# Patient Record
Sex: Female | Born: 1960 | Race: Black or African American | Hispanic: No | Marital: Married | Smoking: Never smoker
Health system: Southern US, Community
[De-identification: ages and names within clinical notes are randomized; demographics above are authoritative.]

## PROBLEM LIST (undated history)

## (undated) DIAGNOSIS — M51369 Other intervertebral disc degeneration, lumbar region without mention of lumbar back pain or lower extremity pain: Secondary | ICD-10-CM

## (undated) DIAGNOSIS — D509 Iron deficiency anemia, unspecified: Secondary | ICD-10-CM

## (undated) DIAGNOSIS — R569 Unspecified convulsions: Secondary | ICD-10-CM

## (undated) DIAGNOSIS — T7840XA Allergy, unspecified, initial encounter: Secondary | ICD-10-CM

## (undated) DIAGNOSIS — I10 Essential (primary) hypertension: Secondary | ICD-10-CM

## (undated) DIAGNOSIS — J302 Other seasonal allergic rhinitis: Secondary | ICD-10-CM

## (undated) DIAGNOSIS — M5136 Other intervertebral disc degeneration, lumbar region: Secondary | ICD-10-CM

## (undated) DIAGNOSIS — Z9889 Other specified postprocedural states: Secondary | ICD-10-CM

## (undated) DIAGNOSIS — J45909 Unspecified asthma, uncomplicated: Secondary | ICD-10-CM

## (undated) DIAGNOSIS — G43909 Migraine, unspecified, not intractable, without status migrainosus: Secondary | ICD-10-CM

## (undated) DIAGNOSIS — E669 Obesity, unspecified: Secondary | ICD-10-CM

## (undated) DIAGNOSIS — M359 Systemic involvement of connective tissue, unspecified: Secondary | ICD-10-CM

## (undated) DIAGNOSIS — R011 Cardiac murmur, unspecified: Secondary | ICD-10-CM

## (undated) DIAGNOSIS — D219 Benign neoplasm of connective and other soft tissue, unspecified: Secondary | ICD-10-CM

## (undated) DIAGNOSIS — M329 Systemic lupus erythematosus, unspecified: Secondary | ICD-10-CM

## (undated) DIAGNOSIS — Z9289 Personal history of other medical treatment: Secondary | ICD-10-CM

## (undated) DIAGNOSIS — C801 Malignant (primary) neoplasm, unspecified: Secondary | ICD-10-CM

## (undated) HISTORY — DX: Other intervertebral disc degeneration, lumbar region: M51.36

## (undated) HISTORY — DX: Migraine, unspecified, not intractable, without status migrainosus: G43.909

## (undated) HISTORY — DX: Allergy, unspecified, initial encounter: T78.40XA

## (undated) HISTORY — DX: Other intervertebral disc degeneration, lumbar region without mention of lumbar back pain or lower extremity pain: M51.369

## (undated) HISTORY — PX: OTHER SURGICAL HISTORY: SHX169

## (undated) HISTORY — DX: Obesity, unspecified: E66.9

## (undated) HISTORY — DX: Unspecified convulsions: R56.9

## (undated) HISTORY — PX: HERNIA REPAIR: SHX51

## (undated) HISTORY — DX: Personal history of other medical treatment: Z92.89

## (undated) HISTORY — PX: MOUTH SURGERY: SHX715

## (undated) HISTORY — DX: Other specified postprocedural states: Z98.890

## (undated) HISTORY — DX: Benign neoplasm of connective and other soft tissue, unspecified: D21.9

## (undated) HISTORY — DX: Iron deficiency anemia, unspecified: D50.9

## (undated) HISTORY — PX: ABDOMINAL HYSTERECTOMY: SHX81

---

## 1998-12-09 ENCOUNTER — Encounter: Payer: Self-pay | Admitting: Internal Medicine

## 1998-12-09 ENCOUNTER — Encounter: Admission: RE | Admit: 1998-12-09 | Discharge: 1998-12-09 | Payer: Self-pay | Admitting: Internal Medicine

## 1999-06-17 ENCOUNTER — Other Ambulatory Visit: Admission: RE | Admit: 1999-06-17 | Discharge: 1999-06-17 | Payer: Self-pay | Admitting: Obstetrics and Gynecology

## 1999-08-03 ENCOUNTER — Inpatient Hospital Stay (HOSPITAL_COMMUNITY): Admission: RE | Admit: 1999-08-03 | Discharge: 1999-08-06 | Payer: Self-pay | Admitting: Obstetrics and Gynecology

## 1999-08-03 ENCOUNTER — Encounter (INDEPENDENT_AMBULATORY_CARE_PROVIDER_SITE_OTHER): Payer: Self-pay | Admitting: Specialist

## 1999-12-21 ENCOUNTER — Encounter: Admission: RE | Admit: 1999-12-21 | Discharge: 1999-12-21 | Payer: Self-pay | Admitting: Obstetrics and Gynecology

## 1999-12-21 ENCOUNTER — Encounter: Payer: Self-pay | Admitting: Obstetrics and Gynecology

## 2000-06-17 ENCOUNTER — Other Ambulatory Visit: Admission: RE | Admit: 2000-06-17 | Discharge: 2000-06-17 | Payer: Self-pay | Admitting: Obstetrics and Gynecology

## 2000-12-23 ENCOUNTER — Encounter: Payer: Self-pay | Admitting: Obstetrics and Gynecology

## 2000-12-23 ENCOUNTER — Encounter: Admission: RE | Admit: 2000-12-23 | Discharge: 2000-12-23 | Payer: Self-pay | Admitting: Obstetrics and Gynecology

## 2001-06-20 ENCOUNTER — Other Ambulatory Visit: Admission: RE | Admit: 2001-06-20 | Discharge: 2001-06-20 | Payer: Self-pay | Admitting: Obstetrics and Gynecology

## 2001-12-25 ENCOUNTER — Encounter: Payer: Self-pay | Admitting: Obstetrics and Gynecology

## 2001-12-25 ENCOUNTER — Encounter: Admission: RE | Admit: 2001-12-25 | Discharge: 2001-12-25 | Payer: Self-pay | Admitting: Obstetrics and Gynecology

## 2003-01-04 ENCOUNTER — Encounter: Admission: RE | Admit: 2003-01-04 | Discharge: 2003-01-04 | Payer: Self-pay | Admitting: Obstetrics and Gynecology

## 2003-10-29 ENCOUNTER — Emergency Department (HOSPITAL_COMMUNITY): Admission: EM | Admit: 2003-10-29 | Discharge: 2003-10-29 | Payer: Self-pay | Admitting: Emergency Medicine

## 2004-01-16 ENCOUNTER — Encounter: Admission: RE | Admit: 2004-01-16 | Discharge: 2004-01-16 | Payer: Self-pay | Admitting: Obstetrics and Gynecology

## 2005-02-15 ENCOUNTER — Encounter: Admission: RE | Admit: 2005-02-15 | Discharge: 2005-02-15 | Payer: Self-pay | Admitting: Obstetrics and Gynecology

## 2006-02-17 ENCOUNTER — Encounter: Admission: RE | Admit: 2006-02-17 | Discharge: 2006-02-17 | Payer: Self-pay | Admitting: Obstetrics and Gynecology

## 2006-03-02 ENCOUNTER — Encounter: Admission: RE | Admit: 2006-03-02 | Discharge: 2006-03-02 | Payer: Self-pay | Admitting: Obstetrics and Gynecology

## 2007-01-26 HISTORY — PX: BACK SURGERY: SHX140

## 2007-03-06 ENCOUNTER — Encounter: Admission: RE | Admit: 2007-03-06 | Discharge: 2007-03-06 | Payer: Self-pay | Admitting: Internal Medicine

## 2007-05-03 ENCOUNTER — Ambulatory Visit (HOSPITAL_COMMUNITY): Admission: RE | Admit: 2007-05-03 | Discharge: 2007-05-04 | Payer: Self-pay | Admitting: Neurosurgery

## 2008-03-14 ENCOUNTER — Encounter: Admission: RE | Admit: 2008-03-14 | Discharge: 2008-03-14 | Payer: Self-pay | Admitting: Obstetrics and Gynecology

## 2008-03-21 ENCOUNTER — Encounter: Admission: RE | Admit: 2008-03-21 | Discharge: 2008-03-21 | Payer: Self-pay | Admitting: Obstetrics and Gynecology

## 2009-01-25 DIAGNOSIS — M329 Systemic lupus erythematosus, unspecified: Secondary | ICD-10-CM

## 2009-01-25 DIAGNOSIS — IMO0002 Reserved for concepts with insufficient information to code with codable children: Secondary | ICD-10-CM

## 2009-01-25 HISTORY — DX: Systemic lupus erythematosus, unspecified: M32.9

## 2009-01-25 HISTORY — DX: Reserved for concepts with insufficient information to code with codable children: IMO0002

## 2009-03-17 ENCOUNTER — Encounter: Admission: RE | Admit: 2009-03-17 | Discharge: 2009-03-17 | Payer: Self-pay | Admitting: Internal Medicine

## 2009-03-25 ENCOUNTER — Encounter: Admission: RE | Admit: 2009-03-25 | Discharge: 2009-03-25 | Payer: Self-pay | Admitting: Internal Medicine

## 2010-02-02 ENCOUNTER — Encounter
Admission: RE | Admit: 2010-02-02 | Discharge: 2010-02-24 | Payer: Self-pay | Source: Home / Self Care | Attending: Internal Medicine | Admitting: Internal Medicine

## 2010-02-15 ENCOUNTER — Encounter: Payer: Self-pay | Admitting: Obstetrics and Gynecology

## 2010-02-26 ENCOUNTER — Ambulatory Visit: Payer: BC Managed Care – PPO | Attending: Internal Medicine | Admitting: Physical Therapy

## 2010-02-26 ENCOUNTER — Encounter: Payer: Self-pay | Admitting: Physical Therapy

## 2010-02-26 DIAGNOSIS — IMO0001 Reserved for inherently not codable concepts without codable children: Secondary | ICD-10-CM | POA: Insufficient documentation

## 2010-02-26 DIAGNOSIS — M542 Cervicalgia: Secondary | ICD-10-CM | POA: Insufficient documentation

## 2010-02-26 DIAGNOSIS — M25519 Pain in unspecified shoulder: Secondary | ICD-10-CM | POA: Insufficient documentation

## 2010-03-02 ENCOUNTER — Ambulatory Visit: Payer: BC Managed Care – PPO | Admitting: Physical Therapy

## 2010-03-02 ENCOUNTER — Other Ambulatory Visit: Payer: Self-pay | Admitting: Internal Medicine

## 2010-03-02 DIAGNOSIS — Z1231 Encounter for screening mammogram for malignant neoplasm of breast: Secondary | ICD-10-CM

## 2010-03-19 ENCOUNTER — Ambulatory Visit
Admission: RE | Admit: 2010-03-19 | Discharge: 2010-03-19 | Disposition: A | Discharge status: Home / Self Care | Payer: BC Managed Care – PPO | Source: Ambulatory Visit | Attending: Internal Medicine | Admitting: Internal Medicine

## 2010-03-19 DIAGNOSIS — Z1231 Encounter for screening mammogram for malignant neoplasm of breast: Secondary | ICD-10-CM

## 2010-06-09 NOTE — Op Note (Signed)
NAMEARACELY, RICKETT               ACCOUNT NO.:  [id number]   MEDICAL RECORD NO.:  [id number]          PATIENT TYPE:  OIB   LOCATION:  3599                         FACILITY:  [facility]   PHYSICIAN:  Coletta Memos, M.D.     DATE OF BIRTH:  1960-11-14   DATE OF PROCEDURE:  DATE OF DISCHARGE:                               OPERATIVE REPORT   PREOPERATIVE DIAGNOSIS:  Displaced disk L5-S1 left.   POSTOPERATIVE DIAGNOSIS:  Displaced disk L5-S1 left.   PROCEDURE:  Left L5-S1 semi hemilaminectomy and diskectomy with  microdissection, diskectomy at L5-S1 with microdissection.   SURGEON:  Coletta Memos, M.D.   ASSISTANT:  Hilda Lias, M.D.   COMPLICATIONS:  None.   ANESTHESIA:  General endotracheal.   INDICATIONS:  Shamiracle Gorden is a 50 year old, severe pain in the left  lower extremity secondary to what is a herniated disk at L5-S1 on the  left side.  After a long period of conservative treatment without  improvement.  She agreed to undergo operative decompression.   OPERATIVE NOTE:  Ms. Cochrane was brought to the operating room intubated  and placed under general anesthetic without difficulty.  She was rolled  prone onto a Wilson frame and all pressure points were properly padded.  Her back was prepped and she was draped in a sterile fashion.  I  infiltrated 10 mL 0.5% lidocaine 1:200,000 epinephrine into the  subcutaneous tissue and left paraspinous musculature.  I opened the skin  with a #10 blade and I took this down to the thoracolumbar fascia.  I  then exposed the lamina of L4-L5 and S1.  I placed the double ended  ganglion knife inferior to what I believed to be L5 and x-ray showed I  was in the correct interlaminar space.  I then performed a semi  hemilaminectomy of L5 using a high-speed drill.  The spinal canal was  quite narrow at this level.  I did a partial medial facetectomy in order  to create more space to work around the disk.  I then encountered what  was a very large  hump, which was both disk and a bony ridge at the L5-S1  disk space with Dr. Cassandria Santee assistance and microdissection we were able  to remove the disk and decompress the nerve roots.  I took down to reach  to some degree of a by no means that I did any extensive work on that  ridge.  The nerve root seemed to be decompressed and I felt that nothing  else was necessary.  I then irrigated the wound.  I then closed with a  layered fashion using Vicryl sutures reapproximating thoracolumbar  subcutaneous and subcuticular layers.  Dermabond used for sterile  dressing.           ______________________________  Coletta Memos, M.D.    KC/MEDQ  D:  05/03/2007  T:  05/04/2007  Job:  [id number]

## 2010-06-12 NOTE — Discharge Summary (Signed)
[facility]  Patient:    Cynthia Frye, Cynthia Frye                      MRN: [id number] Adm. Date:  [id number] Disc. Date: [id number] Attending:  Malon Kindle                           Discharge Summary  HISTORY OF PRESENT ILLNESS:  This is a 50 year old black female with large fibroids admitted for hysterectomy.  HOSPITAL COURSE:  The patient underwent abdominal hysterectomy and lysis of adhesions of the left tube and ovary on August 03, 1999, by Dr. Ambrose Mantle with Dr. Senaida Ores assisting under general anesthesia.  Blood loss was about 400 cc.  On the evening of the day of surgery, I was in the patients room and I measured her pulse at 112.  The aide had just raised the head of her bed. The patient began complaining of weakness and feeling hot and I checked her pulse and it was in the 40s.  I lowered the patients head and her pulse recovered.  She felt better.  Impression was near syncope.  Her hematocrit was 32.7.  The abdomen was soft and nontender.  Output was good.  EKG showed nonspecific T-wave abnormalities.  The patient had no further problems.  She did continue to fun a somewhat rapid pulse.  TSH was done and was normal.  On the third postoperative day, her pulse was still at 108.  She had passed flatus and then had a bowel movement.  Abdomen was soft and nontender.  She was felt to be a candidate for discharge.  Her incision was healing well.  LABORATORY DATA:  Pathology report showed a uterus with nonspecific chronic cervicitis, benign secretory endometrium, leiomyomata uteri, 1470 gram uterus. The uterine body measured 21 x 14.5 x 11 cm consisting predominantly of two leiomyomas, one subserosal and the other intramural which were 10.5 and 14.5 cm in greatest dimension.  There was no evidence of malignancy.  The EKG showed a sinus tachycardia with nonspecific T-wave abnormality.  Urinalysis was negative.  Hemoglobin on admission was 12.3, hematocrit  36.7, white count 64,000, platelet count 227,000.  Follow-up hematocrit is 32.7 and 33.9, 52 segs, 34 lymphs, 5 monos, 4 eosinophils and 1 basophil.  Comprehensive metabolic profile was normal.  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  OPERATION:  Abdominal hysterectomy, lysis of tubo-ovarian adhesions on the left.  FINAL CONDITION:  Improved.  DISCHARGE INSTRUCTIONS: 1. Regular discharge instructions.  No vaginal entrance, no heavy lifting, no    strenuous activity. 2. Report any fever greater than 100.4 degrees. 3. Report any unusual problems. 4. Return to the office in four days. 5. Mepergan Fortis six tablets one every four to six hours is given    as needed for pain. DD:  11/01/99 TD:  11/02/99 Job: [id number]

## 2010-06-12 NOTE — H&P (Signed)
[facility]  Patient:    Cynthia Frye, Cynthia Frye                        MRN: [id number] Adm. Date:  08/03/99 Attending:  Malachi Pro. Ambrose Mantle, M.D.                         History and Physical  PRESENT ILLNESS:  This is a 50 year old black married female, para 0, who was admitted to the hospital for abdominal hysterectomy because of 20-week size fibroid uterus.  The patients last menstrual period was July 08, 1999.  The patient gives a history of regular periods every 26-28 days, lasting 5-6 days, associated with cramps, relieved by Advil.  The first two days the flow is heavy requiring 6 pads per day and she does have slight dyspareunia.  She does not plan to have children.  On May 29, 1999, the patient went to urgent care, complaining of a one-week history of right upper abdominal pain that spread to the umbilicus.  The only significant finding was a large fibroid uterus to the umbilicus that on ultrasound showed two large fibroids and a couple of smaller fibroids.  The largest fibroid measured 0.5 x 10.5 x 12 cm.  A second large one measured 7.5 x 7.1 x 8.1 cm.  Both ovaries appeared normal.  I saw the patient on Jun 17, 1999.  I told her with the size f the fibroid being at the umbilicus and possibly because of her pain, that she should have myomectomy versus hysterectomy.  She discussed it with her husband nd they elected to proceed with hysterectomy.  ALLERGIES:  Reveals no known allergies.  OPERATIONS:  Umbilical herniorrhaphy as a child.  ILLNESSES:  The usual childhood diseases.  The patient also states she has had asthma.  She also has a history of epilepsy, however, she is taking no medications at the present time.  REVIEW OF SYSTEMS:  Essentially unremarkable except as noted in the present illness, and in the last week she did have, what appeared to be, an upper respiratory infection treated with a Z-Pak, Majic mouthwash, and  Claritin.  MEDICATIONS:  The patient does take Claritin on a chronic basis.  FAMILY HISTORY:  Mother died at 60 of asthma.  Father died at 21 of heart problems. One one-half sister is living and well, and one brother is living and well.  PHYSICAL EXAMINATION:  GENERAL:  Well-developed, well-nourished black female, 5 feet 8 inches, 181 pounds.  VITAL SIGNS:  Blood pressure 150/90, pulse 130.  The patient admitted that she as quite apprehensive.  HEAD, EARS, EYES, NOSE, THROAT:  No cranial abnormalities.  Extraocular movements are intact.  Nose and pharynx:  Clear.  NECK:  Supple without thyromegaly.  HEART:  Normal size and sounds.  No murmurs.  LUNGS:  Clear to P&A.  BREASTS:  Soft without masses lying down or sitting up.  ABDOMEN:  Soft.  There is a healed umbilical herniorrhaphy scar.  The abdominopelvic mass arises from the pelvis to the umbilicus.  It is quite firm nd it is difficult to tell how much mobility it has.  EXTREMITIES:  Negative.  GENITOURINARY:  Pap smear on Jun 17, 1999 was normal.  Vulva and vagina are clean. BUS:  Negative.  Cervix:  Clean.  Uterus thought to be about 20-week size. Adnexa: Free of masses.  ADMITTING IMPRESSION:  Large abdominopelvic mass thought to be secondary to  leiomyomata uteri.  PLAN:  The patient is admitted for abdominal hysterectomy, possibly through a midline incision.  If possible the ovaries will be conserved.  The patient understands the risks of surgery include, but are limited to, heart attacks, stroke, pulmonary embolus, wound disruption, hemorrhage with need for re-operation and/or transfusion, fistula formation, nerve injury, intestinal obstruction, and an unknown impact on her sex drive.  She understands and agrees to proceed. DD:  08/02/99 TD:  08/03/99 Job: [id number]

## 2010-06-12 NOTE — Op Note (Signed)
[facility]  Patient:    Cynthia Frye, Cynthia Frye                        MRN: [id number] Proc. Date: 08/03/99 Attending:  Malachi Pro. Ambrose Mantle, M.D.                           Operative Report  PREOPERATIVE DIAGNOSIS:  Large fibroids to the umbilicus.  POSTOPERATIVE DIAGNOSES:  Large fibroids to the umbilicus with left tuboovarian adhesions.  OPERATION:  Abdominal hysterectomy, lysis of adhesions.  OPERATOR:  Malachi Pro. Ambrose Mantle, M.D.  ASSISTANT:  Alvino Chapel, M.D.  ANESTHESIA:  General anesthesia.  DESCRIPTION OF PROCEDURE:  The patient was brought to the operating room and placed under satisfactory general anesthesia and placed in the frog-leg position.  The abdomen, vulva, vagina and urethra were prepped with Betadine solution and draped as a sterile field after a Foley catheter was inserted to straight drain.  Exam revealed that the uterus was not very mobile and I did not feel like I could be sure I could access the fibroids through a transverse incision, so after the patient was placed supine and draped as a sterile field, I made a midline incision from the pubis to the umbilicus, carried it in layers through the skin and subcutaneous tissue and the fascia, the rectus muscle was separated in the midline and peritoneum opened vertically. Exploration of the upper abdomen revealed the gallbladder to be smooth and cystic without stones.  The liver felt normal from what I could feel of it. Both kidneys felt normal.  Exploration of the lower abdomen and pelvis revealed a huge fibroid uterus, going to the umbilicus.  This was composed mainly of two huge fibroids, one probably 10 cm in diameter and the other one probably 16 to 18 cm in diameter.  The right upper pedicle was easy to access. The left upper pedicle was less easy because of the large fibroid just under the left tube and ovary.  A retractor and then packs were used for better exposure.  I was  able to clamp, cut and suture-ligate the right upper pedicle between clamps and doubly suture-ligated it.  The left upper pedicle was more difficult to get to.  It took several clamps to separate the left infundibulopelvic ligament and uteroovarian ligament from the fibroid.  After all pedicles had been secured, the uterus then elevated well into the operative field.  The uterine vessels were clamped, cut and suture-ligated bilaterally.  The bladder was pushed inferiorly after incising the lower-segment peritoneum.  The parametrial tissues, paracervical tissues and uterosacral ligaments were clamped, cut and suture-ligated and the uterosacral ligaments were held.  The right vaginal angle was entered and then the uterus was removed by transecting the upper vagina.  Vaginal angle sutures were placed and then the central portion of the cuff which had several large bleeders was closed with interrupted figure-of-eight sutures of 0 Vicryl.  It should be noted that prior to removing the uterus, the adhesions between the left tube and ovary had been separated.  After they were separated, they looked perfectly normal, as did the right tube and ovary.  I searched for any small bleeders, was able to find a few, bovied them and sutured them.  I identified both ureters; they were well-free of the operative field.  I infused the bladder with methylene blue-stained fluid, found it to be well-free of the operative  field, sutured the uterosacral ligaments together in the midline, reperitonealized over the vaginal cuff, identified the appendix, which was normal, and then closed the abdominal wall after removing packs and retractors with a running suture of 0 Vicryl on the peritoneum, interrupted figure-of-eight sutures of #1 Novofil on the fascia, a running 3-0 Vicryl on the subcu tissue and staples on the skin.  Patient seemed to tolerate the procedure well.  Blood loss was thought to be about 400 cc. DD:   08/03/99 TD:  08/03/99 Job: [id number]

## 2010-10-20 LAB — BASIC METABOLIC PANEL
BUN: 8
CO2: 28
Calcium: 9.7
Chloride: 102
Creatinine, Ser: 0.84
GFR calc Af Amer: >60
GFR calc non Af Amer: >60
Glucose, Bld: 96
Potassium: 3.6
Sodium: 135

## 2010-10-20 LAB — CBC
HCT: 36.7
Hemoglobin: 12.6
MCHC: 34.3
MCV: 90.6
Platelets: 281
RBC: 4.05
RDW: 13.4
WBC: 3.8 — ABNORMAL LOW

## 2011-03-29 ENCOUNTER — Other Ambulatory Visit: Payer: Self-pay | Admitting: Obstetrics and Gynecology

## 2011-03-29 DIAGNOSIS — Z1231 Encounter for screening mammogram for malignant neoplasm of breast: Secondary | ICD-10-CM

## 2011-04-06 ENCOUNTER — Ambulatory Visit
Admission: RE | Admit: 2011-04-06 | Discharge: 2011-04-06 | Disposition: A | Discharge status: Home / Self Care | Payer: BC Managed Care – PPO | Source: Ambulatory Visit | Attending: Obstetrics and Gynecology | Admitting: Obstetrics and Gynecology

## 2011-04-06 DIAGNOSIS — Z1231 Encounter for screening mammogram for malignant neoplasm of breast: Secondary | ICD-10-CM

## 2011-10-25 ENCOUNTER — Ambulatory Visit (INDEPENDENT_AMBULATORY_CARE_PROVIDER_SITE_OTHER): Payer: BC Managed Care – PPO | Admitting: Family Medicine

## 2011-10-25 VITALS — BP 128/83 | HR 82 | Temp 97.8°F | Resp 16 | Ht 67.0 in | Wt 178.6 lb

## 2011-10-25 DIAGNOSIS — T7840XA Allergy, unspecified, initial encounter: Secondary | ICD-10-CM

## 2011-10-25 DIAGNOSIS — G5600 Carpal tunnel syndrome, unspecified upper limb: Secondary | ICD-10-CM

## 2011-10-25 DIAGNOSIS — M329 Systemic lupus erythematosus, unspecified: Secondary | ICD-10-CM

## 2011-10-25 DIAGNOSIS — L0291 Cutaneous abscess, unspecified: Secondary | ICD-10-CM

## 2011-10-25 DIAGNOSIS — L0390 Cellulitis, unspecified: Secondary | ICD-10-CM

## 2011-10-25 DIAGNOSIS — S5420XA Injury of radial nerve at forearm level, unspecified arm, initial encounter: Secondary | ICD-10-CM

## 2011-10-25 DIAGNOSIS — M25539 Pain in unspecified wrist: Secondary | ICD-10-CM

## 2011-10-25 LAB — POCT CBC
Granulocyte percent: 53.5 %G (ref 37–80)
HCT, POC: 36.9 % — AB (ref 37.7–47.9)
Hemoglobin: 11.4 g/dL — AB (ref 12.2–16.2)
Lymph, poc: 1.2 (ref 0.6–3.4)
MCH, POC: 29.5 pg (ref 27–31.2)
MCHC: 30.9 g/dL — AB (ref 31.8–35.4)
MCV: 95.4 fL (ref 80–97)
MID (cbc): 0.4 (ref 0–0.9)
MPV: 9.1 fL (ref 0–99.8)
POC Granulocyte: 1.9 — AB (ref 2–6.9)
POC LYMPH PERCENT: 34.3 %L (ref 10–50)
POC MID %: 12.2 %M — AB (ref 0–12)
Platelet Count, POC: 237 10*3/uL (ref 142–424)
RBC: 3.87 M/uL — AB (ref 4.04–5.48)
RDW, POC: 15.4 %
WBC: 3.6 10*3/uL — AB (ref 4.6–10.2)

## 2011-10-25 MED ORDER — CEPHALEXIN 500 MG PO CAPS: 500.0000 mg | ORAL_CAPSULE | Freq: Three times a day (TID) | ORAL | Status: DC

## 2011-10-25 NOTE — Patient Instructions (Signed)
Zyrtec Benadryl cream or ointment on rash Take antibiotic 3 times daily--Keflex Wrist splints.

## 2011-10-25 NOTE — Progress Notes (Signed)
S:Hx of lupus.  Got a steroid shot from Dr. Orlin Hilding 1 week ago.  Began breaking out 2 days later.  Large splotch of erythema and itching right upper arm.  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  Wrists hurting her more.  Wants new splints  O: Erythema and warmth right upper arm about 8x15 cm.  Mild whelp like nature.  No nodes .  No other rash.  Wrists mildly tender.  Not swollen.  A: Cortisone shot allergy Lupus Wrist pains  Plan wrist splints Zyrtec Benadryl ointment Keflex Return if worse.  Results for orders placed in visit on 10/25/11  POCT CBC      Component Value Range   WBC 3.6 (*) 4.6 - 10.2 K/uL   Lymph, poc 1.2  0.6 - 3.4   POC LYMPH PERCENT 34.3  10 - 50 %L   MID (cbc) 0.4  0 - 0.9   POC MID % 12.2 (*) 0 - 12 %M   POC Granulocyte 1.9 (*) 2 - 6.9   Granulocyte percent 53.5  37 - 80 %G   RBC 3.87 (*) 4.04 - 5.48 M/uL   Hemoglobin 11.4 (*) 12.2 - 16.2 g/dL   HCT, POC 16.1 (*) 09.6 - 47.9 %   MCV 95.4  80 - 97 fL   MCH, POC 29.5  27 - 31.2 pg   MCHC 30.9 (*) 31.8 - 35.4 g/dL   RDW, POC 04.5     Platelet Count, POC 237  142 - 424 K/uL   MPV 9.1  0 - 99.8 fL

## 2012-03-21 ENCOUNTER — Other Ambulatory Visit: Payer: Self-pay | Admitting: Gastroenterology

## 2012-04-07 ENCOUNTER — Encounter (HOSPITAL_COMMUNITY): Payer: Self-pay | Admitting: *Deleted

## 2012-04-10 NOTE — Progress Notes (Signed)
Forgot her clear liquid lunch asked her to call her Dr. Isidore Moos to see if she would be allowed to eat any food other than clear liquid, that I could make that decision.

## 2012-04-11 ENCOUNTER — Encounter (HOSPITAL_COMMUNITY)
Admission: RE | Disposition: A | Discharge status: Home / Self Care | Payer: Self-pay | Source: Ambulatory Visit | Attending: Gastroenterology

## 2012-04-11 ENCOUNTER — Encounter (HOSPITAL_COMMUNITY): Payer: Self-pay | Admitting: *Deleted

## 2012-04-11 ENCOUNTER — Other Ambulatory Visit (HOSPITAL_COMMUNITY): Payer: Self-pay | Admitting: Gastroenterology

## 2012-04-11 ENCOUNTER — Encounter (HOSPITAL_COMMUNITY): Payer: Self-pay | Admitting: Anesthesiology

## 2012-04-11 ENCOUNTER — Ambulatory Visit (HOSPITAL_COMMUNITY)
Admission: RE | Admit: 2012-04-11 | Discharge: 2012-04-11 | Disposition: A | Discharge status: Home / Self Care | Payer: BC Managed Care – PPO | Source: Ambulatory Visit | Attending: Gastroenterology | Admitting: Gastroenterology

## 2012-04-11 ENCOUNTER — Ambulatory Visit (HOSPITAL_COMMUNITY): Payer: BC Managed Care – PPO | Admitting: Anesthesiology

## 2012-04-11 DIAGNOSIS — Z1211 Encounter for screening for malignant neoplasm of colon: Secondary | ICD-10-CM | POA: Insufficient documentation

## 2012-04-11 HISTORY — DX: Unspecified asthma, uncomplicated: J45.909

## 2012-04-11 HISTORY — PX: COLONOSCOPY WITH PROPOFOL: SHX5780

## 2012-04-11 HISTORY — DX: Essential (primary) hypertension: I10

## 2012-04-11 HISTORY — DX: Other seasonal allergic rhinitis: J30.2

## 2012-04-11 HISTORY — DX: Cardiac murmur, unspecified: R01.1

## 2012-04-11 HISTORY — DX: Systemic lupus erythematosus, unspecified: M32.9

## 2012-04-11 SURGERY — COLONOSCOPY WITH PROPOFOL
Anesthesia: Monitor Anesthesia Care

## 2012-04-11 MED ORDER — LACTATED RINGERS IV SOLN
INTRAVENOUS | Status: DC
  Administered 2012-04-11: 1000 mL via INTRAVENOUS

## 2012-04-11 MED ORDER — KETAMINE HCL 10 MG/ML IJ SOLN
INTRAMUSCULAR | Status: DC | PRN
  Administered 2012-04-11 (×3): 10 mg via INTRAVENOUS

## 2012-04-11 MED ORDER — MIDAZOLAM HCL 5 MG/5ML IJ SOLN
INTRAMUSCULAR | Status: DC | PRN
  Administered 2012-04-11: 2 mg via INTRAVENOUS

## 2012-04-11 MED ORDER — LACTATED RINGERS IV SOLN
INTRAVENOUS | Status: DC | PRN
  Administered 2012-04-11: 12:00:00 via INTRAVENOUS

## 2012-04-11 MED ORDER — SODIUM CHLORIDE 0.9 % IV SOLN: INTRAVENOUS | Status: DC

## 2012-04-11 MED ORDER — PROPOFOL INFUSION 10 MG/ML OPTIME
INTRAVENOUS | Status: DC | PRN
  Administered 2012-04-11: 120 ug/kg/min via INTRAVENOUS

## 2012-04-11 SURGICAL SUPPLY — 21 items

## 2012-04-11 NOTE — Op Note (Signed)
Procedure: Screening colonoscopy  Endoscopist: Danise Edge  Premedication: Propofol administered by anesthesia  Procedure: The patient was placed in the left lateral decubitus position. Anal inspection and digital rectal exam were normal. The Pentax pediatric colonoscope was introduced into the rectum and advanced to the cecum as identified by a normal-appearing ileocecal valve and appendiceal orifice. Colonic preparation for the exam today was good.  Rectum. Normal. Retroflex view of the distal rectum normal.  Sigmoid colon and descending colon. Normal.  Splenic flexure. Normal.  Transverse colon. Normal.  Hepatic flexure. Normal.  Ascending colon. Normal.  Cecum and ileocecal valve. Normal.  Assessment: Normal screening proctocolonoscopy to the cecum  Recommendations: Schedule repeat screening colonoscopy in 10 years

## 2012-04-11 NOTE — Anesthesia Preprocedure Evaluation (Addendum)
Anesthesia Evaluation  Patient identified by MRN, date of birth, ID band Patient awake    Reviewed: Allergy & Precautions, H&P , NPO status , Patient's Chart, lab work & pertinent test results  Airway Mallampati: II TM Distance: >3 FB Neck ROM: Full    Dental  (+) Teeth Intact and Dental Advisory Given   Pulmonary asthma ,  breath sounds clear to auscultation  Pulmonary exam normal       Cardiovascular hypertension, negative cardio ROS  + Valvular Problems/Murmurs Rhythm:Regular Rate:Normal     Neuro/Psych Seizures -,  negative psych ROS   GI/Hepatic negative GI ROS, Neg liver ROS,   Endo/Other  negative endocrine ROS  Renal/GU negative Renal ROS  negative genitourinary   Musculoskeletal negative musculoskeletal ROS (+)   Abdominal   Peds  Hematology negative hematology ROS (+)   Anesthesia Other Findings   Reproductive/Obstetrics negative OB ROS                          Anesthesia Physical Anesthesia Plan  ASA: II  Anesthesia Plan: MAC   Post-op Pain Management:    Induction: Intravenous  Airway Management Planned: Simple Face Mask  Additional Equipment:   Intra-op Plan:   Post-operative Plan:   Informed Consent: I have reviewed the patients History and Physical, chart, labs and discussed the procedure including the risks, benefits and alternatives for the proposed anesthesia with the patient or authorized representative who has indicated his/her understanding and acceptance.   Dental advisory given  Plan Discussed with: CRNA  Anesthesia Plan Comments:         Anesthesia Quick Evaluation

## 2012-04-11 NOTE — Transfer of Care (Signed)
Immediate Anesthesia Transfer of Care Note  Patient: Cynthia Frye  Procedure(s) Performed: Procedure(s): COLONOSCOPY WITH PROPOFOL (N/A)  Patient Location: PACU and Endoscopy Unit  Anesthesia Type:MAC  Level of Consciousness: awake and alert   Airway & Oxygen Therapy: Patient Spontanous Breathing and Patient connected to nasal cannula oxygen  Post-op Assessment: Report given to PACU RN and Post -op Vital signs reviewed and stable  Post vital signs: Reviewed and stable  Complications: No apparent anesthesia complications

## 2012-04-12 ENCOUNTER — Encounter (HOSPITAL_COMMUNITY): Payer: Self-pay | Admitting: Gastroenterology

## 2012-04-12 NOTE — Anesthesia Postprocedure Evaluation (Signed)
Anesthesia Post Note  Patient: Cynthia Frye  Procedure(s) Performed: Procedure(s) (LRB): COLONOSCOPY WITH PROPOFOL (N/A)  Anesthesia type: MAC  Patient location: PACU  Post pain: Pain level controlled  Post assessment: Post-op Vital signs reviewed  Last Vitals:  Filed Vitals:   04/11/12 1325  BP: 124/39  Pulse:   Temp:   Resp: 12    Post vital signs: Reviewed  Level of consciousness: sedated  Complications: No apparent anesthesia complications

## 2012-05-08 ENCOUNTER — Ambulatory Visit: Payer: BC Managed Care – PPO

## 2012-05-08 ENCOUNTER — Ambulatory Visit (INDEPENDENT_AMBULATORY_CARE_PROVIDER_SITE_OTHER): Payer: BC Managed Care – PPO | Admitting: Family Medicine

## 2012-05-08 VITALS — BP 110/68 | HR 80 | Temp 98.5°F | Resp 16 | Ht 68.0 in | Wt 173.0 lb

## 2012-05-08 DIAGNOSIS — D649 Anemia, unspecified: Secondary | ICD-10-CM

## 2012-05-08 DIAGNOSIS — M25539 Pain in unspecified wrist: Secondary | ICD-10-CM

## 2012-05-08 DIAGNOSIS — M25531 Pain in right wrist: Secondary | ICD-10-CM

## 2012-05-08 DIAGNOSIS — M329 Systemic lupus erythematosus, unspecified: Secondary | ICD-10-CM

## 2012-05-08 DIAGNOSIS — IMO0001 Reserved for inherently not codable concepts without codable children: Secondary | ICD-10-CM

## 2012-05-08 DIAGNOSIS — D702 Other drug-induced agranulocytosis: Secondary | ICD-10-CM

## 2012-05-08 DIAGNOSIS — D696 Thrombocytopenia, unspecified: Secondary | ICD-10-CM

## 2012-05-08 LAB — POCT CBC
Granulocyte percent: 41.9 %G (ref 37–80)
HCT, POC: 24.5 % — AB (ref 37.7–47.9)
Hemoglobin: 7.6 g/dL — AB (ref 12.2–16.2)
Lymph, poc: 1 (ref 0.6–3.4)
MCH, POC: 28.9 pg (ref 27–31.2)
MCHC: 31 g/dL — AB (ref 31.8–35.4)
MCV: 93.1 fL (ref 80–97)
MID (cbc): 0.3 (ref 0–0.9)
MPV: 8.4 fL (ref 0–99.8)
POC Granulocyte: 0.9 — AB (ref 2–6.9)
POC LYMPH PERCENT: 45.3 %L (ref 10–50)
POC MID %: 12.8 %M — AB (ref 0–12)
Platelet Count, POC: 129 10*3/uL — AB (ref 142–424)
RBC: 2.63 M/uL — AB (ref 4.04–5.48)
RDW, POC: 13.9 %
WBC: 2.1 10*3/uL — AB (ref 4.6–10.2)

## 2012-05-08 NOTE — Patient Instructions (Addendum)
Continue your diclofenac  Wear the wrist splint 24 hours a day except for showers.  If wrist continues to bother you over the next week we will probably need to be referred to an orthopedist to evaluate the DeQuervain's tenosynovitis  Contact Dr. Nickola Major tomorrow before taking your methotrexate injection to be sure she wants you to have it. I am concerned about the low blood counts.  De Quervain's Disease Suzette Battiest disease is a condition often seen in racquet sports where there is a soreness (inflammation) in the cord like structures (tendons) which attach muscle to bone on the thumb side of the wrist. There may be a tightening of the tissuesaround the tendons. This condition is often helped by giving up or modifying the activity which caused it. When conservative treatment does not help, surgery may be required. Conservative treatment could include changes in the activity which brought about the problem or made it worse. Anti-inflammatory medications and injections may be used to help decrease the inflammation and help with pain control. Your caregiver will help you determine which is best for you. DIAGNOSIS  Often the diagnosis (learning what is wrong) can be made by examination. Sometimes x-rays are required. HOME CARE INSTRUCTIONS   Apply ice to the sore area for 15 to 20 minutes, 3 to 4 times per day while awake. Put the ice in a plastic bag and place a towel between the bag of ice and your skin. This is especially helpful if it can be done after all activities involving the sore wrist.  Temporary splinting may help.  Only take over-the-counter or prescription medicines for pain, discomfort or fever as directed by your caregiver. SEEK MEDICAL CARE IF:   Pain relief is not obtained with medications, or if you have increasing pain and seem to be getting worse rather than better. MAKE SURE YOU:   Understand these instructions.  Will watch your condition.  Will get help right away if  you are not doing well or get worse. Document Released: 10/06/2000 Document Revised: 04/05/2011 Document Reviewed: 01/11/2005 [facility] Patient Information 2013 [place], [place].

## 2012-05-08 NOTE — Progress Notes (Signed)
Subjective: Patient is here for a couple of things. For several weeks she's had pain in the right wrist over the radial aspect. She says she feels little knot there. It's been very tender. Knows of no injury. She is left handed although she is right-hand for many things. For the last couple of days she has had generalized body aches. She's felt chilled like she had a fever at times. No documented fever. She has had some cough  Objective: Chest clear. Heart regular without murmurs. Throat clear. TMs normal. Neck supple without nodes tenderness. No abdominal complaints and not examined. Extremities unremarkable. Except for right wrist which is tender over the extensor tendons of the thumb  Assessment: Probable DeQuervain's tenosynovitis History of lupus Viral syndrome with myalgias  Plan: CBC X-ray wrist  [facility] reading (PRIMARY) by  Dr. Alwyn Ren .normal wrist  Results for orders placed in visit on 05/08/12  POCT CBC      Result Value Range   WBC 2.1 (*) 4.6 - 10.2 K/uL   Lymph, poc 1.0  0.6 - 3.4   POC LYMPH PERCENT 45.3  10 - 50 %L   MID (cbc) 0.3  0 - 0.9   POC MID % 12.8 (*) 0 - 12 %M   POC Granulocyte 0.9 (*) 2 - 6.9   Granulocyte percent 41.9  37 - 80 %G   RBC 2.63 (*) 4.04 - 5.48 M/uL   Hemoglobin 7.6 (*) 12.2 - 16.2 g/dL   HCT, POC 19.1 (*) 47.8 - 47.9 %   MCV 93.1  80 - 97 fL   MCH, POC 28.9  27 - 31.2 pg   MCHC 31.0 (*) 31.8 - 35.4 g/dL   RDW, POC 29.5     Platelet Count, POC 129 (*) 142 - 424 K/uL   MPV 8.4  0 - 99.8 fL   Neutropenia is probably from for lupus and viral infection.   We will fax her labs to your Dr. Zenovia Jordan

## 2012-05-15 ENCOUNTER — Other Ambulatory Visit: Payer: Self-pay

## 2012-05-15 DIAGNOSIS — Z1231 Encounter for screening mammogram for malignant neoplasm of breast: Secondary | ICD-10-CM

## 2012-06-14 ENCOUNTER — Ambulatory Visit
Admission: RE | Admit: 2012-06-14 | Discharge: 2012-06-14 | Disposition: A | Discharge status: Home / Self Care | Payer: BC Managed Care – PPO | Source: Ambulatory Visit

## 2012-06-14 DIAGNOSIS — Z1231 Encounter for screening mammogram for malignant neoplasm of breast: Secondary | ICD-10-CM

## 2012-11-30 ENCOUNTER — Encounter (HOSPITAL_COMMUNITY): Payer: Self-pay | Admitting: Emergency Medicine

## 2012-11-30 ENCOUNTER — Emergency Department (HOSPITAL_COMMUNITY)
Admission: EM | Admit: 2012-11-30 | Discharge: 2012-11-30 | Disposition: A | Discharge status: Home / Self Care | Payer: BC Managed Care – PPO | Attending: Emergency Medicine | Admitting: Emergency Medicine

## 2012-11-30 ENCOUNTER — Emergency Department (HOSPITAL_COMMUNITY): Payer: BC Managed Care – PPO

## 2012-11-30 DIAGNOSIS — R011 Cardiac murmur, unspecified: Secondary | ICD-10-CM | POA: Insufficient documentation

## 2012-11-30 DIAGNOSIS — I10 Essential (primary) hypertension: Secondary | ICD-10-CM | POA: Insufficient documentation

## 2012-11-30 DIAGNOSIS — J45909 Unspecified asthma, uncomplicated: Secondary | ICD-10-CM | POA: Insufficient documentation

## 2012-11-30 DIAGNOSIS — Z7982 Long term (current) use of aspirin: Secondary | ICD-10-CM | POA: Insufficient documentation

## 2012-11-30 DIAGNOSIS — Z8669 Personal history of other diseases of the nervous system and sense organs: Secondary | ICD-10-CM | POA: Insufficient documentation

## 2012-11-30 DIAGNOSIS — M329 Systemic lupus erythematosus, unspecified: Secondary | ICD-10-CM | POA: Insufficient documentation

## 2012-11-30 DIAGNOSIS — Z79899 Other long term (current) drug therapy: Secondary | ICD-10-CM | POA: Insufficient documentation

## 2012-11-30 DIAGNOSIS — D649 Anemia, unspecified: Secondary | ICD-10-CM | POA: Insufficient documentation

## 2012-11-30 DIAGNOSIS — R55 Syncope and collapse: Secondary | ICD-10-CM | POA: Insufficient documentation

## 2012-11-30 LAB — CBC WITH DIFFERENTIAL/PLATELET

## 2012-11-30 LAB — POCT I-STAT, CHEM 8
BUN: 12 mg/dL (ref 6–23)
Calcium, Ion: 1.2 mmol/L (ref 1.12–1.23)
Chloride: 104 mEq/L (ref 96–112)
Creatinine, Ser: 1 mg/dL (ref 0.50–1.10)
Glucose, Bld: 95 mg/dL (ref 70–99)
HCT: 42 % (ref 36.0–46.0)
Hemoglobin: 14.3 g/dL (ref 12.0–15.0)
Potassium: 4.2 mEq/L (ref 3.5–5.1)
Sodium: 140 mEq/L (ref 135–145)
TCO2: 23 mmol/L (ref 0–100)

## 2012-11-30 LAB — GLUCOSE, CAPILLARY: Glucose-Capillary: 76 mg/dL (ref 70–99)

## 2012-11-30 LAB — HEMOGLOBIN AND HEMATOCRIT, BLOOD

## 2012-11-30 MED ORDER — IBUPROFEN 200 MG PO TABS
600.0000 mg | ORAL_TABLET | Freq: Once | ORAL | Status: AC
  Administered 2012-11-30: 600 mg via ORAL
  Filled 2012-11-30: qty 3

## 2012-11-30 MED ORDER — SODIUM CHLORIDE 0.9 % IV BOLUS (SEPSIS)
500.0000 mL | Freq: Once | INTRAVENOUS | Status: AC
  Administered 2012-11-30: 500 mL via INTRAVENOUS

## 2012-11-30 NOTE — ED Notes (Signed)
Dr Tanna Savoy notified of critical lab values.

## 2012-11-30 NOTE — ED Notes (Signed)
Critical lab values WBC 1.4 and Hgb 4.5

## 2012-11-30 NOTE — ED Notes (Signed)
Bed: WA04 Expected date:  Expected time:  Means of arrival:  Comments: ems 52 yo F, syncopal episode

## 2012-11-30 NOTE — ED Provider Notes (Addendum)
CSN: [id number]     Arrival date & time 11/30/12  4098 History   First MD Initiated Contact with Patient 11/30/12 [phone]     Chief Complaint  Patient presents with  . Loss of Consciousness   (Consider location/radiation/quality/duration/timing/severity/associated sxs/prior Treatment) Patient is a 52 y.o. female presenting with syncope. The history is provided by the patient.  Loss of Consciousness Episode history:  Single Most recent episode:  Today Duration:  1 minute Timing:  Constant Progression:  Resolved Chronicity:  New Context: standing up   Context comment:  Pt was sitting at her desk at work when she started to feel hot and not well with stomach cramps (she had taken a laxative last night and had not eaten this a.m.) she got up to walk to the nurses station and passed out in the hall. Witnessed: yes   Relieved by:  Lying down Worsened by:  Nothing tried Ineffective treatments:  None tried Associated symptoms: no chest pain, no fever, no focal weakness, no headaches, no nausea, no palpitations, no recent fall, no recent injury, no recent surgery, no shortness of breath, no vomiting and no weakness   Associated symptoms comment:  Pt was caught by coworker and was not injured during the syncopal event.  No tongue biting or bowel/bladder incontinence. Risk factors comment:  Hx of lupus and heart murmur   Past Medical History  Diagnosis Date  . Hypertension   . Lupus 2011  . Heart murmur   . Asthma     allergy shots and medication  . Seizures     as a child no treatment none x 30 years  . Anemia   . Seasonal allergies    Past Surgical History  Procedure Laterality Date  . Back surgery  2009  . Hernia repair      as child unbilical  . Abdominal hysterectomy    . Mouth surgery    . Colonoscopy with propofol N/A 04/11/2012    Procedure: COLONOSCOPY WITH PROPOFOL;  Surgeon: Charolett Bumpers, MD;  Location: [facility];  Service: Endoscopy;  Laterality: N/A;   No family  history on file. History  Substance Use Topics  . Smoking status: Never Smoker   . Smokeless tobacco: Never Used  . Alcohol Use: No   OB History   Grav Para Term Preterm Abortions TAB SAB Ect Mult Living                 Review of Systems  Constitutional: Negative for fever.  Respiratory: Negative for shortness of breath.   Cardiovascular: Positive for syncope. Negative for chest pain and palpitations.  Gastrointestinal: Negative for nausea and vomiting.  Neurological: Negative for focal weakness, weakness and headaches.  All other systems reviewed and are negative.    Allergies  Review of patient's allergies indicates no known allergies.  Home Medications   Current Outpatient Rx  Name  Route  Sig  Dispense  Refill  . amLODipine-benazepril (LOTREL) 5-10 MG per capsule   Oral   Take 1 capsule by mouth daily.         Marland Kitchen aspirin 81 MG tablet   Oral   Take 81 mg by mouth daily.         . cephALEXin (KEFLEX) 500 MG capsule   Oral   Take 1 capsule (500 mg total) by mouth 3 (three) times daily.   30 capsule   0   . cholecalciferol (VITAMIN D) 1000 UNITS tablet   Oral   Take 1,000  Units by mouth daily.         . diclofenac (VOLTAREN) 75 MG EC tablet   Oral   Take 75 mg by mouth 2 (two) times daily.         . ferrous sulfate 325 (65 FE) MG tablet   Oral   Take 325 mg by mouth daily with breakfast.         . fexofenadine-pseudoephedrine (ALLEGRA-D 24) 180-240 MG per 24 hr tablet   Oral   Take 1 tablet by mouth daily.         . folic acid (FOLVITE) 1 MG tablet   Oral   Take 1 mg by mouth daily.         . methotrexate (RHEUMATREX) 2.5 MG tablet   Oral   Take 25 mg by mouth once a week. Caution:Chemotherapy. Protect from light.         Marland Kitchen oxyCODONE-acetaminophen (PERCOCET/ROXICET) 5-325 MG per tablet   Oral   Take 1 tablet by mouth as needed.          BP 126/84  Pulse 99  Temp(Src) 97.6 F (36.4 C) (Oral)  Resp 14  SpO2 99% Physical Exam   Nursing note and vitals reviewed. Constitutional: She is oriented to person, place, and time. She appears well-developed and well-nourished. No distress.  HENT:  Head: Normocephalic and atraumatic.  Mouth/Throat: Oropharynx is clear and moist.  Eyes: Conjunctivae and EOM are normal. Pupils are equal, round, and reactive to light.  Neck: Normal range of motion. Neck supple.  Cardiovascular: Normal rate, regular rhythm and intact distal pulses.   Murmur heard.  Systolic murmur is present with a grade of 1/6  Pulmonary/Chest: Effort normal and breath sounds normal. No respiratory distress. She has no wheezes. She has no rales.  Abdominal: Soft. She exhibits no distension. There is no tenderness. There is no rebound and no guarding.  Musculoskeletal: Normal range of motion. She exhibits no edema and no tenderness.  Neurological: She is alert and oriented to person, place, and time.  Skin: Skin is warm and dry. No rash noted. No erythema.  Psychiatric: She has a normal mood and affect. Her behavior is normal.    ED Course  Procedures (including critical care time) Labs Review Labs Reviewed  CBC WITH DIFFERENTIAL  GLUCOSE, CAPILLARY  HEMOGLOBIN AND HEMATOCRIT, BLOOD  POCT I-STAT, CHEM 8   Imaging Review Dg Chest 2 View  11/30/2012   CLINICAL DATA:  52 year old female is shortness of breath, weakness and fatigue.  EXAM: CHEST  2 VIEW  COMPARISON:  02/05/2010  FINDINGS: This is a mildly low volume film.  There is no evidence of focal airspace disease, pulmonary edema, suspicious pulmonary nodule/mass, pleural effusion, or pneumothorax. No acute bony abnormalities are identified.  IMPRESSION: No active cardiopulmonary disease.   Electronically Signed   By: Laveda Abbe M.D.   On: 11/30/2012 10:14    EKG Interpretation     Ventricular Rate:  89 PR Interval:  166 QRS Duration: 87 QT Interval:  367 QTC Calculation: 446 R Axis:   27 Text Interpretation:  Sinus rhythm Normal ECG No  significant change since last tracing            MDM   1. Syncope     Patient with a syncopal episode today that seemed most like a vagal event. She states that she did take her blood pressure medication this morning but had also taken a laxative and was having stomach cramping. Patient states  she had not eaten today as well and started feeling hot and then stood up to walk to the nurse and syncopized.  Pt is now assymptomatic and well appearing with normal VS.   No orthostatics present.  Pt does take BP and lupus meds but denies any CP, SOB, palpitations prior or after event.  She states she has had 1 episode of near syncope in the past that felt just like this 3-4 years ago.  No hx of diabetes.  Will check for electrolyte abnormalities or anemia for cause for syncope.  EKG wnl without prolonged QT, brugada's or other dysrhythmia.  CXR pending.  Low suspicion for PE as pt is not tachycardic and has no CP or SOB  Pt given crackers and water.  11:44 AM I-stat with CBG of 35 and K of <2 but hb of 10 but then on CBC hb of 4.5.  Will get BMP, CBG and H&H to confirm.  12:40 PM Initial lab draw all labs were erroneous. Repeat hemoglobin confirmed to be 13 and repeat i-STAT showed normal levels.  Pt still having some abd cramping but is o/w eating and feeling better.  Will d/c home with husband to f/u with PCP.  Pt given strict return precautions to return for CP, palpitations or SOB.  Gwyneth Sprout, MD 11/30/12 1258  Gwyneth Sprout, MD 11/30/12 1259  Gwyneth Sprout, MD 11/30/12 1300

## 2012-11-30 NOTE — ED Notes (Signed)
Per EMS pt was at work this morning, started feeling hot, got up to work to nurses station to be checked out and had syncopal episode witnessed by coworkers who report to ems pt was unresponsive for 2-3 min. EMS sts on their arrival pt was A+Ox4, denies any complaints. Pt stated similar episode happened in past, few years ago, was never evaluated.

## 2012-12-06 ENCOUNTER — Ambulatory Visit
Admission: RE | Admit: 2012-12-06 | Discharge: 2012-12-06 | Disposition: A | Discharge status: Home / Self Care | Payer: BC Managed Care – PPO | Source: Ambulatory Visit | Attending: Internal Medicine | Admitting: Internal Medicine

## 2012-12-06 ENCOUNTER — Other Ambulatory Visit: Payer: Self-pay | Admitting: Internal Medicine

## 2012-12-06 DIAGNOSIS — G518 Other disorders of facial nerve: Secondary | ICD-10-CM

## 2012-12-06 DIAGNOSIS — R55 Syncope and collapse: Secondary | ICD-10-CM

## 2012-12-06 MED ORDER — IOHEXOL 300 MG/ML  SOLN: 100.0000 mL | Freq: Once | INTRAMUSCULAR | Status: DC | PRN

## 2012-12-25 DIAGNOSIS — H6980 Other specified disorders of Eustachian tube, unspecified ear: Secondary | ICD-10-CM

## 2012-12-25 DIAGNOSIS — H6990 Unspecified Eustachian tube disorder, unspecified ear: Secondary | ICD-10-CM

## 2012-12-25 HISTORY — DX: Other specified disorders of Eustachian tube, unspecified ear: H69.80

## 2012-12-25 HISTORY — DX: Unspecified eustachian tube disorder, unspecified ear: H69.90

## 2013-01-15 ENCOUNTER — Ambulatory Visit: Payer: BC Managed Care – PPO | Admitting: Emergency Medicine

## 2013-01-15 VITALS — BP 120/82 | HR 86 | Temp 98.7°F | Resp 16 | Ht 67.5 in | Wt 184.2 lb

## 2013-01-15 DIAGNOSIS — J020 Streptococcal pharyngitis: Secondary | ICD-10-CM

## 2013-01-15 MED ORDER — MAGIC MOUTHWASH W/LIDOCAINE: 10.0000 mL | ORAL | Status: DC | PRN

## 2013-01-15 MED ORDER — PENICILLIN V POTASSIUM 500 MG PO TABS: 500.0000 mg | ORAL_TABLET | Freq: Four times a day (QID) | ORAL | Status: DC

## 2013-01-15 MED ORDER — ACETAMINOPHEN-CODEINE #3 300-30 MG PO TABS: 1.0000 | ORAL_TABLET | ORAL | Status: DC | PRN

## 2013-01-15 NOTE — Progress Notes (Signed)
[facility] [address], [place] [address] [phone]  Date:  01/15/2013   Name:  Cynthia Frye   DOB:  [birth year]   MRN:  [id number]  PCP:  Pearla Dubonnet, MD    Chief Complaint: Sore Throat   History of Present Illness:  Cynthia Frye is a 52 y.o. very pleasant female patient who presents with the following:  Sudden onset of sore throat, dysphonia and odynophagia this morning.  Muscle aches, myalgias.  No fever or chills, cough or coryza.  No wheezing or shortness of breath.  No sick contacts.  No improvement with over the counter medications or other home remedies. Denies other complaint or health concern today.   There are no active problems to display for this patient.   Past Medical History  Diagnosis Date  . Hypertension   . Lupus 2011  . Heart murmur   . Asthma     allergy shots and medication  . Seizures     as a child no treatment none x 30 years  . Anemia   . Seasonal allergies     Past Surgical History  Procedure Laterality Date  . Back surgery  2009  . Hernia repair      as child unbilical  . Abdominal hysterectomy    . Mouth surgery    . Colonoscopy with propofol N/A 04/11/2012    Procedure: COLONOSCOPY WITH PROPOFOL;  Surgeon: Charolett Bumpers, MD;  Location: [facility];  Service: Endoscopy;  Laterality: N/A;    History  Substance Use Topics  . Smoking status: Never Smoker   . Smokeless tobacco: Never Used  . Alcohol Use: No    No family history on file.  No Known Allergies  Medication list has been reviewed and updated.  Current Outpatient Prescriptions on File Prior to Visit  Medication Sig Dispense Refill  . amLODipine-benazepril (LOTREL) 5-10 MG per capsule Take 1 capsule by mouth daily.      Marland Kitchen aspirin 81 MG tablet Take 81 mg by mouth daily.      . cholecalciferol (VITAMIN D) 1000 UNITS tablet Take 1,000 Units by mouth daily.      . diclofenac (VOLTAREN) 75 MG EC tablet Take 75 mg by mouth 2 (two)  times daily.      . diphenhydrAMINE (BENADRYL) 25 MG tablet Take 25 mg by mouth every 6 (six) hours as needed.      . ferrous sulfate 325 (65 FE) MG tablet Take 325 mg by mouth daily with breakfast.      . fexofenadine-pseudoephedrine (ALLEGRA-D 24) 180-240 MG per 24 hr tablet Take 1 tablet by mouth daily.      . folic acid (FOLVITE) 1 MG tablet Take 1 mg by mouth daily.      . methotrexate (RHEUMATREX) 2.5 MG tablet Take 25 mg by mouth once a week. Caution:Chemotherapy. Protect from light.      Marland Kitchen oxyCODONE-acetaminophen (PERCOCET/ROXICET) 5-325 MG per tablet Take 1 tablet by mouth as needed.       No current facility-administered medications on file prior to visit.    Review of Systems:  As per HPI, otherwise negative.    Physical Examination: Filed Vitals:   01/15/13 1327  BP: 120/82  Pulse: 86  Temp: 98.7 F (37.1 C)  Resp: 16   Filed Vitals:   01/15/13 1327  Height: 5' 7.5" (1.715 m)  Weight: 184 lb 3.2 oz (83.553 kg)   Body mass index is 28.41 kg/(m^2).  Ideal Body Weight: Weight in (lb) to have BMI = 25: 161.7  GEN: WDWN, NAD, Non-toxic, A & O x 3 HEENT: Atraumatic, Normocephalic. Neck supple. No masses, anterior cervical LAD. Intense beefy red throat.   Ears and Nose: No external deformity. CV: RRR, No M/G/R. No JVD. No thrill. No extra heart sounds. PULM: CTA B, no wheezes, crackles, rhonchi. No retractions. No resp. distress. No accessory muscle use. ABD: S, NT, ND, +BS. No rebound. No HSM. EXTR: No c/c/e NEURO Normal gait.  PSYCH: Normally interactive. Conversant. Not depressed or anxious appearing.  Calm demeanor.    Assessment and Plan: Strep throat. Tylenol #3 Pen VK  Signed,  Phillips Odor, MD

## 2013-01-15 NOTE — Patient Instructions (Signed)
Strep Throat  Strep throat is an infection of the throat caused by a bacteria named Streptococcus pyogenes. Your caregiver may call the infection streptococcal "tonsillitis" or "pharyngitis" depending on whether there are signs of inflammation in the tonsils or back of the throat. Strep throat is most common in children aged 51 15 years during the cold months of the year, but it can occur in people of any age during any season. This infection is spread from person to person (contagious) through coughing, sneezing, or other close contact.  SYMPTOMS   · Fever or chills.  · Painful, swollen, red tonsils or throat.  · Pain or difficulty when swallowing.  · White or yellow spots on the tonsils or throat.  · Swollen, tender lymph nodes or "glands" of the neck or under the jaw.  · Red rash all over the body (rare).  DIAGNOSIS   Many different infections can cause the same symptoms. A test must be done to confirm the diagnosis so the right treatment can be given. A "rapid strep test" can help your caregiver make the diagnosis in a few minutes. If this test is not available, a light swab of the infected area can be used for a throat culture test. If a throat culture test is done, results are usually available in a day or two.  TREATMENT   Strep throat is treated with antibiotic medicine.  HOME CARE INSTRUCTIONS   · Gargle with 1 tsp of salt in 1 cup of warm water, 3 4 times per day or as needed for comfort.  · Family members who also have a sore throat or fever should be tested for strep throat and treated with antibiotics if they have the strep infection.  · Make sure everyone in your household washes their hands well.  · Do not share food, drinking cups, or personal items that could cause the infection to spread to others.  · You may need to eat a soft food diet until your sore throat gets better.  · Drink enough water and fluids to keep your urine clear or pale yellow. This will help prevent dehydration.  · Get plenty of  rest.  · Stay home from school, daycare, or work until you have been on antibiotics for 24 hours.  · Only take over-the-counter or prescription medicines for pain, discomfort, or fever as directed by your caregiver.  · If antibiotics are prescribed, take them as directed. Finish them even if you start to feel better.  SEEK MEDICAL CARE IF:   · The glands in your neck continue to enlarge.  · You develop a rash, cough, or earache.  · You cough up green, yellow-brown, or bloody sputum.  · You have pain or discomfort not controlled by medicines.  · Your problems seem to be getting worse rather than better.  SEEK IMMEDIATE MEDICAL CARE IF:   · You develop any new symptoms such as vomiting, severe headache, stiff or painful neck, chest pain, shortness of breath, or trouble swallowing.  · You develop severe throat pain, drooling, or changes in your voice.  · You develop swelling of the neck, or the skin on the neck becomes red and tender.  · You have a fever.  · You develop signs of dehydration, such as fatigue, dry mouth, and decreased urination.  · You become increasingly sleepy, or you cannot wake up completely.  Document Released: 01/09/2000 Document Revised: 12/29/2011 Document Reviewed: 03/12/2010  [employer]® Patient Information ©2014 [employer].

## 2013-07-24 ENCOUNTER — Other Ambulatory Visit: Payer: Self-pay

## 2013-07-24 DIAGNOSIS — Z1231 Encounter for screening mammogram for malignant neoplasm of breast: Secondary | ICD-10-CM

## 2013-07-25 ENCOUNTER — Ambulatory Visit
Admission: RE | Admit: 2013-07-25 | Discharge: 2013-07-25 | Disposition: A | Discharge status: Home / Self Care | Payer: BC Managed Care – PPO | Source: Ambulatory Visit

## 2013-07-25 DIAGNOSIS — Z1231 Encounter for screening mammogram for malignant neoplasm of breast: Secondary | ICD-10-CM

## 2013-07-31 ENCOUNTER — Other Ambulatory Visit: Payer: Self-pay | Admitting: Obstetrics and Gynecology

## 2013-07-31 DIAGNOSIS — R928 Other abnormal and inconclusive findings on diagnostic imaging of breast: Secondary | ICD-10-CM

## 2013-08-07 ENCOUNTER — Ambulatory Visit
Admission: RE | Admit: 2013-08-07 | Discharge: 2013-08-07 | Disposition: A | Discharge status: Home / Self Care | Payer: BC Managed Care – PPO | Source: Ambulatory Visit | Attending: Obstetrics and Gynecology | Admitting: Obstetrics and Gynecology

## 2013-08-07 DIAGNOSIS — R928 Other abnormal and inconclusive findings on diagnostic imaging of breast: Secondary | ICD-10-CM

## 2013-08-10 ENCOUNTER — Other Ambulatory Visit: Payer: BC Managed Care – PPO

## 2014-08-11 ENCOUNTER — Ambulatory Visit (INDEPENDENT_AMBULATORY_CARE_PROVIDER_SITE_OTHER): Payer: BLUE CROSS/BLUE SHIELD | Admitting: Internal Medicine

## 2014-08-11 DIAGNOSIS — J988 Other specified respiratory disorders: Secondary | ICD-10-CM

## 2014-08-11 DIAGNOSIS — J4520 Mild intermittent asthma, uncomplicated: Secondary | ICD-10-CM | POA: Diagnosis not present

## 2014-08-11 DIAGNOSIS — J22 Unspecified acute lower respiratory infection: Secondary | ICD-10-CM

## 2014-08-11 MED ORDER — HYDROCODONE-HOMATROPINE 5-1.5 MG/5ML PO SYRP: 5.0000 mL | ORAL_SOLUTION | Freq: Four times a day (QID) | ORAL | Status: DC | PRN

## 2014-08-11 MED ORDER — AZITHROMYCIN 250 MG PO TABS: ORAL_TABLET | ORAL | Status: DC

## 2014-08-11 NOTE — Progress Notes (Signed)
Subjective:  This chart was scribed for Tami Lin, MD by Moises Blood, Medical Scribe. This patient was seen in Room 8 and the patient's care was started at 11:33 AM.     Patient ID: Cynthia Frye, female    DOB: 03-22-1960, 54 y.o.   MRN: [id number]  HPI Cynthia Frye is a 54 y.o. female who presents to [facility] complaining of a sore throat that started 3 days ago. It started with some bad coughs that causes her to wake up while sleeping. Last night and this morning, she noticed she was coughing up some phlegm. She mentions having some wheezing from all the coughing. She denies any drainage from her nose, and fever. She has not had any known sick contact.   History of reactive airway disease with infections  She has lupus and has not had any kidney problems recently. She also denies any skin rashes and lesions.  She sees Gavin Pound for lupus.   There are no active problems to display for this patient.   Current outpatient prescriptions:  .  amLODipine-benazepril (LOTREL) 5-10 MG per capsule, Take 1 capsule by mouth daily., Disp: , Rfl:  .  aspirin 81 MG tablet, Take 81 mg by mouth daily., Disp: , Rfl:  .  beclomethasone (QVAR) 80 MCG/ACT inhaler, Inhale into the lungs 2 (two) times daily., Disp: , Rfl:  .  cholecalciferol (VITAMIN D) 1000 UNITS tablet, Take 1,000 Units by mouth daily., Disp: , Rfl:  .  diclofenac (VOLTAREN) 75 MG EC tablet, Take 75 mg by mouth 2 (two) times daily., Disp: , Rfl:  .  diphenhydrAMINE (BENADRYL) 25 MG tablet, Take 25 mg by mouth every 6 (six) hours as needed., Disp: , Rfl:  .  ferrous sulfate 325 (65 FE) MG tablet, Take 325 mg by mouth daily with breakfast., Disp: , Rfl:  .  fexofenadine-pseudoephedrine (ALLEGRA-D 24) 180-240 MG per 24 hr tablet, Take 1 tablet by mouth daily., Disp: , Rfl:  .  folic acid (FOLVITE) 1 MG tablet, Take 1 mg by mouth daily., Disp: , Rfl:  .  methotrexate (RHEUMATREX) 2.5 MG tablet, Take 25 mg by mouth once a week.  Caution:Chemotherapy. Protect from light., Disp: , Rfl:  .  oxyCODONE-acetaminophen (PERCOCET/ROXICET) 5-325 MG per tablet, Take 1 tablet by mouth as needed., Disp: , Rfl:  .  acetaminophen-codeine (TYLENOL #3) 300-30 MG per tablet, Take 1-2 tablets by mouth every 4 (four) hours as needed for moderate pain. (Patient not taking: Reported on 08/11/2014), Disp: 30 tablet, Rfl: 0 .  Alum & Mag Hydroxide-Simeth (MAGIC MOUTHWASH W/LIDOCAINE) SOLN, Take 10 mLs by mouth every 2 (two) hours as needed for mouth pain. (Patient not taking: Reported on 08/11/2014), Disp: 360 mL, Rfl: 0 .  penicillin v potassium (VEETID) 500 MG tablet, Take 1 tablet (500 mg total) by mouth 4 (four) times daily. (Patient not taking: Reported on 08/11/2014), Disp: 40 tablet, Rfl: 0    Review of Systems  Constitutional: Negative for fever, chills, diaphoresis and fatigue.  HENT: Positive for congestion and sore throat. Negative for postnasal drip, rhinorrhea and sneezing.   Respiratory: Positive for cough and wheezing.   Gastrointestinal: Negative for nausea, vomiting, diarrhea and constipation.  Skin: Negative for rash.       Objective:   Physical Exam  Constitutional: She is oriented to person, place, and time. She appears well-developed and well-nourished. No distress.  HENT:  Head: Normocephalic and atraumatic.  Right Ear: External ear normal.  Left Ear: External  ear normal.  Mouth/Throat: Oropharynx is clear and moist.  Boggy nares with purulent discharge  Eyes: EOM are normal. Pupils are equal, round, and reactive to light.  Neck: Neck supple.  Cardiovascular: Normal rate, regular rhythm and normal heart sounds.   Pulmonary/Chest: Effort normal. No respiratory distress. She has wheezes.  Wheezes with forced expiration  Musculoskeletal: Normal range of motion.  Lymphadenopathy:    She has no cervical adenopathy.  Neurological: She is alert and oriented to person, place, and time.  Skin: Skin is warm and dry.    Psychiatric: She has a normal mood and affect. Her behavior is normal.  Nursing note and vitals reviewed.  vital signs were normal but were not entered into the office visit in [place]:  Reactive airway disease secondary to sinusitis or lower respiratory infection in a patient with lupus  Meds ordered this encounter  Medications  . azithromycin (ZITHROMAX) 250 MG tablet    Sig: As packaged    Dispense:  6 tablet    Refill:  0  . HYDROcodone-homatropine (HYCODAN) 5-1.5 MG/5ML syrup    Sig: Take 5 mLs by mouth every 6 (six) hours as needed.    Dispense:  120 mL    Refill:  0  Sample of Dulera 200/5 given to use 1 inhalation twice a day for one month  I have completed the patient encounter in its entirety as documented by the scribe, with editing by me where necessary. Mortimer Bair P. Laney Pastor, M.D.

## 2014-08-16 ENCOUNTER — Telehealth: Payer: Self-pay

## 2014-08-16 MED ORDER — PROMETHAZINE-DM 6.25-15 MG/5ML PO SYRP: 5.0000 mL | ORAL_SOLUTION | Freq: Four times a day (QID) | ORAL | Status: DC | PRN

## 2014-08-16 NOTE — Telephone Encounter (Signed)
Pt is needing a new rx for a cough medication   Best number [phone]

## 2014-08-16 NOTE — Telephone Encounter (Signed)
Patient is calling to follow up on refill. She would like her medication tonight if possible. I explained that it takes 24-72 hours and in the future it's best to call earlier during the week before her medication run out.

## 2014-08-16 NOTE — Telephone Encounter (Signed)
Assessment & Plan:  Reactive airway disease secondary to sinusitis or lower respiratory infection in a patient with lupus  Meds ordered this encounter  Medications  . azithromycin (ZITHROMAX) 250 MG tablet    Sig: As packaged    Dispense: 6 tablet    Refill: 0  . HYDROcodone-homatropine (HYCODAN) 5-1.5 MG/5ML syrup    Sig: Take 5 mLs by mouth every 6 (six) hours as needed.    Dispense: 120 mL    Refill: 0  Sample of Dulera 200/5 given to use 1 inhalation twice a day for one month       7/17 visit. Spoke with pt, she is still coughing sometimes with phlegm. She wants to keep cough under control due to her asthma flaring up.

## 2014-08-19 NOTE — Telephone Encounter (Signed)
Left message on voicemail.

## 2014-09-13 ENCOUNTER — Other Ambulatory Visit: Payer: Self-pay

## 2014-09-13 DIAGNOSIS — Z1231 Encounter for screening mammogram for malignant neoplasm of breast: Secondary | ICD-10-CM

## 2014-09-24 ENCOUNTER — Ambulatory Visit
Admission: RE | Admit: 2014-09-24 | Discharge: 2014-09-24 | Disposition: A | Discharge status: Home / Self Care | Payer: BLUE CROSS/BLUE SHIELD | Source: Ambulatory Visit

## 2014-09-24 DIAGNOSIS — Z1231 Encounter for screening mammogram for malignant neoplasm of breast: Secondary | ICD-10-CM

## 2015-06-27 ENCOUNTER — Ambulatory Visit (INDEPENDENT_AMBULATORY_CARE_PROVIDER_SITE_OTHER): Payer: Managed Care, Other (non HMO) | Admitting: Family Medicine

## 2015-06-27 VITALS — BP 136/80 | HR 105 | Temp 98.2°F | Resp 15 | Ht 67.75 in | Wt 208.0 lb

## 2015-06-27 DIAGNOSIS — R05 Cough: Secondary | ICD-10-CM

## 2015-06-27 DIAGNOSIS — J029 Acute pharyngitis, unspecified: Secondary | ICD-10-CM | POA: Diagnosis not present

## 2015-06-27 DIAGNOSIS — J069 Acute upper respiratory infection, unspecified: Secondary | ICD-10-CM | POA: Diagnosis not present

## 2015-06-27 DIAGNOSIS — R059 Cough, unspecified: Secondary | ICD-10-CM

## 2015-06-27 MED ORDER — HYDROCODONE-HOMATROPINE 5-1.5 MG/5ML PO SYRP
ORAL_SOLUTION | ORAL | Status: DC
Start: 2015-06-27 — End: 2016-04-12

## 2015-06-27 MED ORDER — BENZONATATE 100 MG PO CAPS: 100.0000 mg | ORAL_CAPSULE | Freq: Three times a day (TID) | ORAL | Status: DC | PRN

## 2015-06-27 NOTE — Progress Notes (Signed)
Subjective:  By signing my name below, I, Moises Blood, attest that this documentation has been prepared under the direction and in the presence of Merri Ray, MD. Electronically Signed: Moises Blood, [place]. 06/27/2015 , 2:50 PM .  Patient was seen in Room 13 .   Patient ID: Cynthia Frye, female    DOB: [birth year], 55 y.o.   MRN: [id number] Chief Complaint  Patient presents with  . Sore Throat  . Sinus Problem  . Cough    x 2days    HPI Cynthia Frye is a 55 y.o. female  Patient believes her seasonal allergies are acting up about 2 days ago. She states that her throat feels raw with coughing and sinus pressure as well. She reports her throat feeling very dry, and feels throat tightness when she coughs. She mentions cough keeping her up at night. She notes having some wheezing. She's tried using throat lozenges and OTC liquid cough medication. She also informs having sweats and subjectively feeling hot, but no measured fever. She says her husband was sick with a cough and sinus drainage. She denies postnasal drip. She denies having an albuterol inhaler at home; she last used one for reactive airway.   She denies taking phentermine or topamax. She denies usually using oxycodone; maybe last use was 1 week ago.   There are no active problems to display for this patient.  Past Medical History  Diagnosis Date  . Hypertension   . Lupus ([place]) 2011  . Heart murmur   . Asthma     allergy shots and medication  . Seizures ([place])     as a child no treatment none x 30 years  . Anemia   . Seasonal allergies    Past Surgical History  Procedure Laterality Date  . Back surgery  2009  . Hernia repair      as child unbilical  . Abdominal hysterectomy    . Mouth surgery    . Colonoscopy with propofol N/A 04/11/2012    Procedure: COLONOSCOPY WITH PROPOFOL;  Surgeon: Garlan Fair, MD;  Location: [facility];  Service: Endoscopy;  Laterality: N/A;   No Known Allergies Prior to  Admission medications   Medication Sig Start Date End Date Taking? Authorizing Provider  acetaminophen-codeine (TYLENOL #3) 300-30 MG per tablet Take 1-2 tablets by mouth every 4 (four) hours as needed for moderate pain. 01/15/13  Yes Roselee Culver, MD  amLODipine-benazepril (LOTREL) 5-10 MG per capsule Take 1 capsule by mouth daily.   Yes Historical Provider, MD  aspirin 81 MG tablet Take 81 mg by mouth daily.   Yes Historical Provider, MD  beclomethasone (QVAR) 80 MCG/ACT inhaler Inhale into the lungs 2 (two) times daily.   Yes Historical Provider, MD  cholecalciferol (VITAMIN D) 1000 UNITS tablet Take 1,000 Units by mouth daily.   Yes Historical Provider, MD  diclofenac (VOLTAREN) 75 MG EC tablet Take 75 mg by mouth 2 (two) times daily.   Yes Historical Provider, MD  diphenhydrAMINE (BENADRYL) 25 MG tablet Take 25 mg by mouth every 6 (six) hours as needed.   Yes Historical Provider, MD  ferrous sulfate 325 (65 FE) MG tablet Take 325 mg by mouth daily with breakfast.   Yes Historical Provider, MD  fexofenadine-pseudoephedrine (ALLEGRA-D 24) 180-240 MG per 24 hr tablet Take 1 tablet by mouth daily.   Yes Historical Provider, MD  folic acid (FOLVITE) 1 MG tablet Take 1 mg by mouth daily.   Yes Historical Provider, MD  hydroxychloroquine (PLAQUENIL) 200 MG tablet TAKE 2 TABLETS BY MOUTH ONCE A DAY WITH FOOD OR MILK 06/17/14  Yes Historical Provider, MD  methotrexate (RHEUMATREX) 2.5 MG tablet Take 25 mg by mouth once a week. Caution:Chemotherapy. Protect from light.   Yes Historical Provider, MD  methotrexate 50 MG/2ML injection INJECT 1 ML ONCE WEEKLY OR AS DIRECTED 06/17/14  Yes Historical Provider, MD  oxyCODONE-acetaminophen (PERCOCET/ROXICET) 5-325 MG per tablet Take 1 tablet by mouth as needed.   Yes Historical Provider, MD  promethazine-dextromethorphan (PROMETHAZINE-DM) 6.25-15 MG/5ML syrup Take 5 mLs by mouth 4 (four) times daily as needed for cough. Patient not taking: Reported on  06/27/2015 08/16/14   Leandrew Koyanagi, MD   Social History   Social History  . Marital Status: Married    Spouse Name: N/A  . Number of Children: N/A  . Years of Education: N/A   Occupational History  . Not on file.   Social History Main Topics  . Smoking status: Never Smoker   . Smokeless tobacco: Never Used  . Alcohol Use: No  . Drug Use: No  . Sexual Activity: Not on file   Other Topics Concern  . Not on file   Social History Narrative   Review of Systems  Constitutional: Positive for fever and diaphoresis. Negative for chills and fatigue.  HENT: Positive for sinus pressure and sore throat. Negative for postnasal drip.   Respiratory: Positive for cough and wheezing. Negative for shortness of breath.   Cardiovascular: Negative for leg swelling.  Gastrointestinal: Negative for nausea and vomiting.       Objective:   Physical Exam  Constitutional: She is oriented to person, place, and time. She appears well-developed and well-nourished. No distress.  HENT:  Head: Normocephalic and atraumatic.  Right Ear: Hearing, tympanic membrane, external ear and ear canal normal.  Left Ear: Hearing, tympanic membrane, external ear and ear canal normal.  Nose: Nose normal. Right sinus exhibits no maxillary sinus tenderness and no frontal sinus tenderness. Left sinus exhibits no maxillary sinus tenderness and no frontal sinus tenderness.  Mouth/Throat: Oropharynx is clear and moist. No oropharyngeal exudate.  Eyes: Conjunctivae and EOM are normal. Pupils are equal, round, and reactive to light.  Cardiovascular: Normal rate, regular rhythm, normal heart sounds and intact distal pulses.   No murmur heard. Pulmonary/Chest: Effort normal and breath sounds normal. No respiratory distress. She has no wheezes. She has no rhonchi.  Musculoskeletal: She exhibits no edema.  Lymphadenopathy:    She has no cervical adenopathy.  Neurological: She is alert and oriented to person, place, and time.   Skin: Skin is warm and dry. No rash noted.  Psychiatric: She has a normal mood and affect. Her behavior is normal.  Vitals reviewed.   Filed Vitals:   06/27/15 1326  BP: 136/80  Pulse: 105  Temp: 98.2 F (36.8 C)  TempSrc: Oral  Resp: 15  Height: 5' 7.75" (1.721 m)  Weight: 208 lb (94.348 kg)  SpO2: 100%      Assessment & Plan:   Cynthia Frye is a 55 y.o. female Acute upper respiratory infection  Sore throat  Cough - Plan: benzonatate (TESSALON) 100 MG capsule, HYDROcodone-homatropine (HYCODAN) 5-1.5 MG/5ML syrup   Suspected viral illness. Symptomatic care discussed with Tessalon or Mucinex DM over-the-counter, fluids, voice rest, and hydrocodone cough syrup at night if needed. Side effects discussed. No wheeze on exam today. RTC precautions.   Meds ordered this encounter  Medications  . DIPROLENE 0.05 % ointment  Sig:   . DISCONTD: phentermine 15 MG capsule    Sig: Take 15 mg by mouth every morning.    Refill:  2  . DISCONTD: topiramate (TOPAMAX) 25 MG tablet    Sig: TAKE 1 TABLET BEFORE BREAKFAST AND BEFORE SUPPER    Refill:  2  . benzonatate (TESSALON) 100 MG capsule    Sig: Take 1 capsule (100 mg total) by mouth 3 (three) times daily as needed for cough.    Dispense:  20 capsule    Refill:  0  . HYDROcodone-homatropine (HYCODAN) 5-1.5 MG/5ML syrup    Sig: 23m by mouth a bedtime as needed for cough.    Dispense:  120 mL    Refill:  0   Patient Instructions       IF you received an x-ray today, you will receive an invoice from [facility]. Please contact [facility] at [phone] with questions or concerns regarding your invoice.   IF you received labwork today, you will receive an invoice from [employer]. Please contact [facility] at [phone] with questions or concerns regarding your invoice.   Our billing staff will not be able to assist you with questions regarding bills from these  companies.  You will be contacted with the lab results as soon as they are available. The fastest way to get your results is to activate your My Chart account. Instructions are located on the last page of this paperwork. If you have not heard from [place] regarding the results in 2 weeks, please contact this office.    Saline nasal spray as needed, over the counter mucinex DM or the prescribed Tessalon Perles as needed for cough during the day, and hydrocodone cough syrup at night if needed,  drink plenty of fluids.  Voice rest as possible, and lozenges/cough drops such as cepacol ok.   Return to the clinic or go to the nearest emergency room if any of your symptoms worsen or new symptoms occur. Upper Respiratory Infection, Adult Most upper respiratory infections (URIs) are a viral infection of the air passages leading to the lungs. A URI affects the nose, throat, and upper air passages. The most common type of URI is nasopharyngitis and is typically referred to as "the common cold." URIs run their course and usually go away on their own. Most of the time, a URI does not require medical attention, but sometimes a bacterial infection in the upper airways can follow a viral infection. This is called a secondary infection. Sinus and middle ear infections are common types of secondary upper respiratory infections. Bacterial pneumonia can also complicate a URI. A URI can worsen asthma and chronic obstructive pulmonary disease (COPD). Sometimes, these complications can require emergency medical care and may be life threatening.  CAUSES Almost all URIs are caused by viruses. A virus is a type of germ and can spread from one person to another.  RISKS FACTORS You may be at risk for a URI if:   You smoke.   You have chronic heart or lung disease.  You have a weakened defense (immune) system.   You are very young or very old.   You have nasal allergies or asthma.  You work in crowded or poorly  ventilated areas.  You work in health care facilities or schools. SIGNS AND SYMPTOMS  Symptoms typically develop 2-3 days after you come in contact with a cold virus. Most viral URIs last 7-10 days. However, viral URIs from the influenza virus (flu virus)  can last 14-18 days and are typically more severe. Symptoms may include:   Runny or stuffy (congested) nose.   Sneezing.   Cough.   Sore throat.   Headache.   Fatigue.   Fever.   Loss of appetite.   Pain in your forehead, behind your eyes, and over your cheekbones (sinus pain).  Muscle aches.  DIAGNOSIS  Your health care provider may diagnose a URI by:  Physical exam.  Tests to check that your symptoms are not due to another condition such as:  Strep throat.  Sinusitis.  Pneumonia.  Asthma. TREATMENT  A URI goes away on its own with time. It cannot be cured with medicines, but medicines may be prescribed or recommended to relieve symptoms. Medicines may help:  Reduce your fever.  Reduce your cough.  Relieve nasal congestion. HOME CARE INSTRUCTIONS   Take medicines only as directed by your health care provider.   Gargle warm saltwater or take cough drops to comfort your throat as directed by your health care provider.  Use a warm mist humidifier or inhale steam from a shower to increase air moisture. This may make it easier to breathe.  Drink enough fluid to keep your urine clear or pale yellow.   Eat soups and other clear broths and maintain good nutrition.   Rest as needed.   Return to work when your temperature has returned to normal or as your health care provider advises. You may need to stay home longer to avoid infecting others. You can also use a face mask and careful hand washing to prevent spread of the virus.  Increase the usage of your inhaler if you have asthma.   Do not use any tobacco products, including cigarettes, chewing tobacco, or electronic cigarettes. If you need  help quitting, ask your health care provider. PREVENTION  The best way to protect yourself from getting a cold is to practice good hygiene.   Avoid oral or hand contact with people with cold symptoms.   Wash your hands often if contact occurs.  There is no clear evidence that vitamin C, vitamin E, echinacea, or exercise reduces the chance of developing a cold. However, it is always recommended to get plenty of rest, exercise, and practice good nutrition.  SEEK MEDICAL CARE IF:   You are getting worse rather than better.   Your symptoms are not controlled by medicine.   You have chills.  You have worsening shortness of breath.  You have brown or red mucus.  You have yellow or brown nasal discharge.  You have pain in your face, especially when you bend forward.  You have a fever.  You have swollen neck glands.  You have pain while swallowing.  You have white areas in the back of your throat. SEEK IMMEDIATE MEDICAL CARE IF:   You have severe or persistent:  Headache.  Ear pain.  Sinus pain.  Chest pain.  You have chronic lung disease and any of the following:  Wheezing.  Prolonged cough.  Coughing up blood.  A change in your usual mucus.  You have a stiff neck.  You have changes in your:  Vision.  Hearing.  Thinking.  Mood. MAKE SURE YOU:   Understand these instructions.  Will watch your condition.  Will get help right away if you are not doing well or get worse.   This information is not intended to replace advice given to you by your health care provider. Make sure you discuss any questions you  have with your health care provider.   Document Released: 07/07/2000 Document Revised: 05/28/2014 Document Reviewed: 04/18/2013 Elsevier Interactive Patient Education [employer].       I personally performed the services described in this documentation, which was scribed in my presence. The recorded information has been reviewed and  considered, and addended by me as needed.   Signed,   Merri Ray, MD [facility].  06/27/2015 2:58 PM

## 2015-06-27 NOTE — Patient Instructions (Addendum)
IF you received an x-ray today, you will receive an invoice from [facility]. Please contact [facility] at [phone] with questions or concerns regarding your invoice.   IF you received labwork today, you will receive an invoice from [employer]. Please contact [facility] at [phone] with questions or concerns regarding your invoice.   Our billing staff will not be able to assist you with questions regarding bills from these companies.  You will be contacted with the lab results as soon as they are available. The fastest way to get your results is to activate your My Chart account. Instructions are located on the last page of this paperwork. If you have not heard from [place] regarding the results in 2 weeks, please contact this office.    Saline nasal spray as needed, over the counter mucinex DM or the prescribed Tessalon Perles as needed for cough during the day, and hydrocodone cough syrup at night if needed,  drink plenty of fluids.  Voice rest as possible, and lozenges/cough drops such as cepacol ok.   Return to the clinic or go to the nearest emergency room if any of your symptoms worsen or new symptoms occur. Upper Respiratory Infection, Adult Most upper respiratory infections (URIs) are a viral infection of the air passages leading to the lungs. A URI affects the nose, throat, and upper air passages. The most common type of URI is nasopharyngitis and is typically referred to as "the common cold." URIs run their course and usually go away on their own. Most of the time, a URI does not require medical attention, but sometimes a bacterial infection in the upper airways can follow a viral infection. This is called a secondary infection. Sinus and middle ear infections are common types of secondary upper respiratory infections. Bacterial pneumonia can also complicate a URI. A URI can worsen asthma and chronic obstructive pulmonary disease  (COPD). Sometimes, these complications can require emergency medical care and may be life threatening.  CAUSES Almost all URIs are caused by viruses. A virus is a type of germ and can spread from one person to another.  RISKS FACTORS You may be at risk for a URI if:   You smoke.   You have chronic heart or lung disease.  You have a weakened defense (immune) system.   You are very young or very old.   You have nasal allergies or asthma.  You work in crowded or poorly ventilated areas.  You work in health care facilities or schools. SIGNS AND SYMPTOMS  Symptoms typically develop 2-3 days after you come in contact with a cold virus. Most viral URIs last 7-10 days. However, viral URIs from the influenza virus (flu virus) can last 14-18 days and are typically more severe. Symptoms may include:   Runny or stuffy (congested) nose.   Sneezing.   Cough.   Sore throat.   Headache.   Fatigue.   Fever.   Loss of appetite.   Pain in your forehead, behind your eyes, and over your cheekbones (sinus pain).  Muscle aches.  DIAGNOSIS  Your health care provider may diagnose a URI by:  Physical exam.  Tests to check that your symptoms are not due to another condition such as:  Strep throat.  Sinusitis.  Pneumonia.  Asthma. TREATMENT  A URI goes away on its own with time. It cannot be cured with medicines, but medicines may be prescribed or recommended to relieve symptoms. Medicines may help:  Reduce your  fever.  Reduce your cough.  Relieve nasal congestion. HOME CARE INSTRUCTIONS   Take medicines only as directed by your health care provider.   Gargle warm saltwater or take cough drops to comfort your throat as directed by your health care provider.  Use a warm mist humidifier or inhale steam from a shower to increase air moisture. This may make it easier to breathe.  Drink enough fluid to keep your urine clear or pale yellow.   Eat soups and other  clear broths and maintain good nutrition.   Rest as needed.   Return to work when your temperature has returned to normal or as your health care provider advises. You may need to stay home longer to avoid infecting others. You can also use a face mask and careful hand washing to prevent spread of the virus.  Increase the usage of your inhaler if you have asthma.   Do not use any tobacco products, including cigarettes, chewing tobacco, or electronic cigarettes. If you need help quitting, ask your health care provider. PREVENTION  The best way to protect yourself from getting a cold is to practice good hygiene.   Avoid oral or hand contact with people with cold symptoms.   Wash your hands often if contact occurs.  There is no clear evidence that vitamin C, vitamin E, echinacea, or exercise reduces the chance of developing a cold. However, it is always recommended to get plenty of rest, exercise, and practice good nutrition.  SEEK MEDICAL CARE IF:   You are getting worse rather than better.   Your symptoms are not controlled by medicine.   You have chills.  You have worsening shortness of breath.  You have brown or red mucus.  You have yellow or brown nasal discharge.  You have pain in your face, especially when you bend forward.  You have a fever.  You have swollen neck glands.  You have pain while swallowing.  You have white areas in the back of your throat. SEEK IMMEDIATE MEDICAL CARE IF:   You have severe or persistent:  Headache.  Ear pain.  Sinus pain.  Chest pain.  You have chronic lung disease and any of the following:  Wheezing.  Prolonged cough.  Coughing up blood.  A change in your usual mucus.  You have a stiff neck.  You have changes in your:  Vision.  Hearing.  Thinking.  Mood. MAKE SURE YOU:   Understand these instructions.  Will watch your condition.  Will get help right away if you are not doing well or get worse.    This information is not intended to replace advice given to you by your health care provider. Make sure you discuss any questions you have with your health care provider.   Document Released: 07/07/2000 Document Revised: 05/28/2014 Document Reviewed: 04/18/2013 Elsevier Interactive Patient Education [employer].

## 2015-07-23 ENCOUNTER — Other Ambulatory Visit: Payer: Self-pay | Admitting: Internal Medicine

## 2015-07-23 DIAGNOSIS — Z1231 Encounter for screening mammogram for malignant neoplasm of breast: Secondary | ICD-10-CM

## 2015-09-25 ENCOUNTER — Ambulatory Visit: Payer: BLUE CROSS/BLUE SHIELD

## 2015-09-30 ENCOUNTER — Ambulatory Visit
Admission: RE | Admit: 2015-09-30 | Discharge: 2015-09-30 | Disposition: A | Discharge status: Home / Self Care | Payer: Managed Care, Other (non HMO) | Source: Ambulatory Visit | Attending: Internal Medicine | Admitting: Internal Medicine

## 2015-09-30 DIAGNOSIS — Z1231 Encounter for screening mammogram for malignant neoplasm of breast: Secondary | ICD-10-CM

## 2015-10-01 ENCOUNTER — Other Ambulatory Visit: Payer: Self-pay | Admitting: Internal Medicine

## 2015-10-01 DIAGNOSIS — R928 Other abnormal and inconclusive findings on diagnostic imaging of breast: Secondary | ICD-10-CM

## 2015-10-06 ENCOUNTER — Other Ambulatory Visit: Payer: Managed Care, Other (non HMO)

## 2015-10-06 ENCOUNTER — Ambulatory Visit
Admission: RE | Admit: 2015-10-06 | Discharge: 2015-10-06 | Disposition: A | Discharge status: Home / Self Care | Payer: Managed Care, Other (non HMO) | Source: Ambulatory Visit | Attending: Internal Medicine | Admitting: Internal Medicine

## 2015-10-06 DIAGNOSIS — R928 Other abnormal and inconclusive findings on diagnostic imaging of breast: Secondary | ICD-10-CM

## 2016-02-20 ENCOUNTER — Other Ambulatory Visit: Payer: Self-pay | Admitting: Internal Medicine

## 2016-02-20 ENCOUNTER — Ambulatory Visit
Admission: RE | Admit: 2016-02-20 | Discharge: 2016-02-20 | Disposition: A | Discharge status: Home / Self Care | Payer: Managed Care, Other (non HMO) | Source: Ambulatory Visit | Attending: Internal Medicine | Admitting: Internal Medicine

## 2016-02-20 DIAGNOSIS — M79602 Pain in left arm: Secondary | ICD-10-CM

## 2016-02-20 DIAGNOSIS — I517 Cardiomegaly: Secondary | ICD-10-CM

## 2016-02-25 ENCOUNTER — Other Ambulatory Visit: Payer: Self-pay

## 2016-02-25 ENCOUNTER — Ambulatory Visit (HOSPITAL_COMMUNITY): Payer: BLUE CROSS/BLUE SHIELD | Attending: Cardiovascular Disease

## 2016-02-25 ENCOUNTER — Other Ambulatory Visit (HOSPITAL_COMMUNITY): Payer: Managed Care, Other (non HMO)

## 2016-02-25 DIAGNOSIS — I351 Nonrheumatic aortic (valve) insufficiency: Secondary | ICD-10-CM | POA: Insufficient documentation

## 2016-02-25 DIAGNOSIS — I517 Cardiomegaly: Secondary | ICD-10-CM | POA: Insufficient documentation

## 2016-03-03 ENCOUNTER — Other Ambulatory Visit: Payer: Self-pay | Admitting: Internal Medicine

## 2016-03-03 DIAGNOSIS — R221 Localized swelling, mass and lump, neck: Secondary | ICD-10-CM

## 2016-03-04 ENCOUNTER — Other Ambulatory Visit: Payer: Managed Care, Other (non HMO)

## 2016-03-08 ENCOUNTER — Other Ambulatory Visit: Payer: Self-pay | Admitting: Internal Medicine

## 2016-03-08 DIAGNOSIS — M79602 Pain in left arm: Secondary | ICD-10-CM

## 2016-03-09 ENCOUNTER — Ambulatory Visit
Admission: RE | Admit: 2016-03-09 | Discharge: 2016-03-09 | Disposition: A | Discharge status: Home / Self Care | Payer: Managed Care, Other (non HMO) | Source: Ambulatory Visit | Attending: Internal Medicine | Admitting: Internal Medicine

## 2016-03-09 DIAGNOSIS — E041 Nontoxic single thyroid nodule: Secondary | ICD-10-CM | POA: Diagnosis not present

## 2016-03-09 DIAGNOSIS — R221 Localized swelling, mass and lump, neck: Secondary | ICD-10-CM

## 2016-03-12 ENCOUNTER — Encounter (HOSPITAL_COMMUNITY): Payer: Managed Care, Other (non HMO)

## 2016-03-16 ENCOUNTER — Encounter: Payer: Self-pay | Admitting: Physician Assistant

## 2016-03-16 ENCOUNTER — Ambulatory Visit (INDEPENDENT_AMBULATORY_CARE_PROVIDER_SITE_OTHER): Payer: 59 | Admitting: Physician Assistant

## 2016-03-16 ENCOUNTER — Ambulatory Visit (INDEPENDENT_AMBULATORY_CARE_PROVIDER_SITE_OTHER): Payer: 59

## 2016-03-16 VITALS — BP 142/70 | HR 84 | Ht 67.0 in | Wt 214.8 lb

## 2016-03-16 DIAGNOSIS — L930 Discoid lupus erythematosus: Secondary | ICD-10-CM | POA: Diagnosis not present

## 2016-03-16 DIAGNOSIS — I10 Essential (primary) hypertension: Secondary | ICD-10-CM

## 2016-03-16 DIAGNOSIS — M79602 Pain in left arm: Secondary | ICD-10-CM | POA: Diagnosis not present

## 2016-03-16 DIAGNOSIS — R9439 Abnormal result of other cardiovascular function study: Secondary | ICD-10-CM

## 2016-03-16 LAB — EXERCISE TOLERANCE TEST
Estimated workload: 5.8 METS
Exercise duration (min): 4 min
Exercise duration (sec): 1 s
MPHR: 165 {beats}/min
Peak HR: 148 {beats}/min
Percent HR: 90 %
Percent of predicted max HR: 89 %
RPE: 15
Rest HR: 84 {beats}/min
Stage 1 DBP: 90 mmHg
Stage 1 Grade: 0 %
Stage 1 HR: 86 {beats}/min
Stage 1 SBP: 168 mmHg
Stage 1 Speed: 0 mph
Stage 2 Grade: 0 %
Stage 2 HR: 88 {beats}/min
Stage 2 Speed: 0 mph
Stage 3 Grade: 0 %
Stage 3 HR: 90 {beats}/min
Stage 3 Speed: 1 mph
Stage 4 Grade: 0 %
Stage 4 HR: 90 {beats}/min
Stage 4 Speed: 1 mph
Stage 5 DBP: 83 mmHg
Stage 5 Grade: 10 %
Stage 5 HR: 136 {beats}/min
Stage 5 SBP: 154 mmHg
Stage 5 Speed: 1.7 mph
Stage 6 Grade: 12 %
Stage 6 HR: 148 {beats}/min
Stage 6 Speed: 2.5 mph
Stage 7 DBP: 78 mmHg
Stage 7 Grade: 0 %
Stage 7 HR: 103 {beats}/min
Stage 7 SBP: 143 mmHg
Stage 7 Speed: 0 mph
Stage 8 DBP: 77 mmHg
Stage 8 Grade: 0 %
Stage 8 HR: 78 {beats}/min
Stage 8 SBP: 142 mmHg
Stage 8 Speed: 0 mph

## 2016-03-16 NOTE — Progress Notes (Signed)
Cardiology Office Note:    Date:  03/16/2016   ID:  Cynthia Frye, DOB 1960/08/02, MRN [id number]  PCP:  Cynthia Screws, MD  Cardiologist:  New - reviewed with Dr. Allegra Lai today.  Electrophysiologist:  n/a  Referring MD: Cynthia Huddle, MD   Chief Complaint  Patient presents with  . Abnormal Exercise Treadmill Test    History of Present Illness:    Cynthia Frye is a 56 y.o. female with a hx of HTN, Lupus, obesity, asthma.  She recently saw her PCP with occasional L arm pain and was referred for ETT today.  Her ETT is abnormal and she is added on for evaluation.  She denies any history of CAD, CHF or vascular disease. She is a nonsmoker. She does note a family history of CAD. Her father passed away at age 53 from a myocardial infarction. The details of his death are not entirely clear to her as she was an infant when he passed away. She denies any exertional chest pain. For the last several weeks, she has noted some left arm discomfort that is difficult to describe. It happens randomly. She cannot attribute it to exertion. She has some neck symptoms that are not related. She has occasional dyspnea that seems to be related to recent medication changes with her hypertension. However, she denies significant dyspnea on exertion. She denies orthopnea, PND or edema. She denies syncope or palpitations.  Prior CV studies:   The following studies were reviewed today:  ETT 03/16/16 Baseline ECG with NSR, HR 84, normal axis, no ST-T wave changes With exercise, there was early development of 2-3 mm of downsloping inferolateral ST depression (early stage II). Test was stopped due to EKG changes. The patient did not have any arm symptoms or chest symptoms. Blood pressure response to exercise was equivocal. Heart rate did recover rapidly in recovery with eventual resolution of ST changes by 3 minutes.  Echo 02/25/16 EF 55-60, no RWMA, normal diastolic function, trivial AI, PASP 32  Past  Medical History:  Diagnosis Date  . Asthma    allergy shots and medication  . DDD (degenerative disc disease), lumbar   . Fibroids   . Heart murmur    Echo 1/18: EF 55-60, no RWMA, normal diastolic function, trivial AI, PASP 32  . Hypertension   . Iron deficiency anemia   . Lupus 2011   Dr. Trudie Reed  . Obesity   . Seasonal allergies   . Seizure in childhood [facility])    as a child no treatment none x 30 years    Past Surgical History:  Procedure Laterality Date  . ABDOMINAL HYSTERECTOMY    . BACK SURGERY  2009  . COLONOSCOPY WITH PROPOFOL N/A 04/11/2012   Procedure: COLONOSCOPY WITH PROPOFOL;  Surgeon: Garlan Fair, MD;  Location: [facility];  Service: Endoscopy;  Laterality: N/A;  . HERNIA REPAIR     as child unbilical  . MOUTH SURGERY      Current Medications: Current Meds  Medication Sig  . acetaminophen-codeine (TYLENOL #3) 300-30 MG per tablet Take 1-2 tablets by mouth every 4 (four) hours as needed for moderate pain.  Marland Kitchen aspirin 81 MG tablet Take 81 mg by mouth daily.  . beclomethasone (QVAR) 80 MCG/ACT inhaler Inhale into the lungs 2 (two) times daily.  . benzonatate (TESSALON) 100 MG capsule Take 1 capsule (100 mg total) by mouth 3 (three) times daily as needed for cough.  . cholecalciferol (VITAMIN D) 1000 UNITS tablet Take 1,000  Units by mouth daily.  . diclofenac (VOLTAREN) 75 MG EC tablet Take 75 mg by mouth 2 (two) times daily.  . diphenhydrAMINE (BENADRYL) 25 MG tablet Take 25 mg by mouth every 6 (six) hours as needed.  Marland Kitchen DIPROLENE 0.05 % ointment Apply 1 application topically as needed (SKIN IRRITATION).   . ferrous sulfate 325 (65 FE) MG tablet Take 325 mg by mouth daily with breakfast.  . fexofenadine-pseudoephedrine (ALLEGRA-D 24) 180-240 MG per 24 hr tablet Take 1 tablet by mouth daily.  Marland Kitchen HYDROcodone-homatropine (HYCODAN) 5-1.5 MG/5ML syrup 21m by mouth a bedtime as needed for cough.  . hydroxychloroquine (PLAQUENIL) 200 MG tablet TAKE 2 TABLETS BY MOUTH  ONCE A DAY WITH FOOD OR MILK  . losartan (COZAAR) 50 MG tablet Take 50 mg by mouth daily.  . methocarbamol (ROBAXIN) 500 MG tablet TAKE 1 TABLET AT BEDTIME AS NEEDED FOR NECK AND SHOULDER PAIN ORALLY 30 DAY(S)  . methotrexate (RHEUMATREX) 2.5 MG tablet Take 25 mg by mouth once a week. Caution:Chemotherapy. Protect from light.  . methotrexate 50 MG/2ML injection INJECT 1 ML ONCE WEEKLY OR AS DIRECTED  . metoprolol succinate (TOPROL-XL) 25 MG 24 hr tablet Take 25 mg by mouth daily.  Marland Kitchen oxyCODONE-acetaminophen (PERCOCET/ROXICET) 5-325 MG per tablet Take 1 tablet by mouth as needed.  . promethazine-dextromethorphan (PROMETHAZINE-DM) 6.25-15 MG/5ML syrup Take 5 mLs by mouth 4 (four) times daily as needed for cough.     Allergies:   Patient has no known allergies.   Social History   Social History  . Marital status: Married    Spouse name: N/A  . Number of children: N/A  . Years of education: N/A   Social History Main Topics  . Smoking status: Never Smoker  . Smokeless tobacco: Never Used  . Alcohol use No  . Drug use: No  . Sexual activity: Not Asked   Other Topics Concern  . 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 Asthma Mother   . Diabetes Mother   . Heart attack Father 40  . Stroke Maternal Aunt      ROS:   Please see the history of present illness.    ROS All other systems reviewed and are negative.   EKGs/Labs/Other Test Reviewed:    EKG:  EKG is not ordered today.  The ekg ordered today demonstrates n/a  Recent Labs: No results found for requested labs within last 8760 hours.   Recent Lipid Panel No results found for: CHOL, TRIG, HDL, CHOLHDL, VLDL, LDLCALC, LDLDIRECT   Physical Exam:    VS:  BP (!) 142/70   Pulse 84   Ht 5\' 7"  (1.702 m)   Wt 214 lb 12.8 oz  (97.4 kg)   BMI 33.64 kg/m     Wt Readings from Last 3 Encounters:  03/16/16 214 lb 12.8 oz (97.4 kg)  06/27/15 208 lb (94.3 kg)  01/15/13 184 lb 3.2 oz (83.6 kg)     Physical Exam  Constitutional: She is oriented to person, place, and time. She appears well-developed and well-nourished. No distress.  HENT:  Head: Normocephalic and atraumatic.  Eyes: No scleral icterus.  Neck: Normal range of motion. No JVD present. Carotid bruit is not present.  Cardiovascular: Normal rate, regular rhythm, S1 normal,  S2 normal and normal heart sounds.   No murmur heard. Pulses:      Dorsalis pedis pulses are 2+ on the right side, and 2+ on the left side.       Posterior tibial pulses are 2+ on the right side, and 2+ on the left side.  Pulmonary/Chest: Breath sounds normal. She has no wheezes. She has no rhonchi. She has no rales.  Abdominal: Soft. There is no tenderness.  Musculoskeletal: She exhibits no edema.  Neurological: She is alert and oriented to person, place, and time.  Skin: Skin is warm and dry.  Psychiatric: She has a normal mood and affect.    ASSESSMENT:    1. Abnormal stress test   2. Essential hypertension   3. Lupus erythematosus, unspecified form    PLAN:    In order of problems listed above:  1. Abnormal stress test - Her ETT was arranged for L arm pain.  With exercise, she had 2-3 mm down sloping inferolateral ST depression at peak stress.  She had no recurrent symptoms on the treadmill.  Her test was stopped in Stage 2 due to the ECG changes.  She has not had chest pain.   Her arm symptoms are atypical for ischemia (no exertional symptoms) and her baseline ECG is fairly normal.  She denies any other symptoms of dyspnea or exertional nausea/diaphoresis or exertional jaw pain.  Her CRFs include HTN, FHx of premature CAD (the details of her father's death is not entirely clear to her).  She has never smoked and does not have diabetes or any other risk equivalent diagnoses.   We discussed the options of stress nuclear study vs coronary CTA vs cardiac cath.  Although her ECGs are markedly abnormal, I am not convinced she needs a heart catheterization at this point.  I reviewed her case with Dr. Allegra Lai (DOD), who agreed.  We will arrange an ETT-Myoview for further evaluation.  I would have a low threshold to proceed with cardiac cath if nuclear images are not entirely normal.  Continue ASA, beta blocker.   2. HTN - BP borderline elevated. She just changed medications and will FU with her PCP.  3. Lupus - Followed by Rheumatology.    Medication Adjustments/Labs and Tests Ordered: Current medicines are reviewed at length with the patient today.  Concerns regarding medicines are outlined above.  Medication changes, Labs and Tests ordered today are outlined in the Patient Instructions noted below. Patient Instructions  Medication Instructions:  Your physician recommends that you continue on your current medications as directed. Please refer to the Current Medication list given to you today.  Labwork: NONE  Testing/Procedures: 1. Your physician has requested that you have en exercise stress myoview. For further information please visit [email or web address]. Please follow instruction sheet, as given.  Follow-Up: AS NEEDED FOR FOLLOW UP AT THIS TIME  Any Other Special Instructions Will Be Listed Below (If Applicable).  If you need a refill on your cardiac medications before your next appointment, please call your pharmacy.   Return depending upon results of stress nuclear study.   Signed, Richardson Dopp, PA-C  03/16/2016 12:52 PM    [facility] [address], [place], [place]  [address] Phone: [phone]; Fax: [phone]

## 2016-03-16 NOTE — Patient Instructions (Addendum)
Medication Instructions:  Your physician recommends that you continue on your current medications as directed. Please refer to the Current Medication list given to you today.  Labwork: NONE  Testing/Procedures: 1. Your physician has requested that you have en exercise stress myoview. For further information please visit [email or web address]. Please follow instruction sheet, as given.  Follow-Up: AS NEEDED FOR FOLLOW UP AT THIS TIME  Any Other Special Instructions Will Be Listed Below (If Applicable).  If you need a refill on your cardiac medications before your next appointment, please call your pharmacy.

## 2016-03-18 ENCOUNTER — Telehealth (HOSPITAL_COMMUNITY): Payer: Self-pay | Admitting: *Deleted

## 2016-03-18 NOTE — Telephone Encounter (Signed)
Left message on voicemail in reference to upcoming appointment scheduled for 03/22/16. Phone number given for a call back so details instructions can be given.  Kirstie Peri

## 2016-03-22 ENCOUNTER — Encounter: Payer: Self-pay | Admitting: Physician Assistant

## 2016-03-22 ENCOUNTER — Ambulatory Visit (HOSPITAL_COMMUNITY): Payer: BLUE CROSS/BLUE SHIELD | Attending: Cardiovascular Disease

## 2016-03-22 ENCOUNTER — Telehealth: Payer: Self-pay | Admitting: Physician Assistant

## 2016-03-22 DIAGNOSIS — R9439 Abnormal result of other cardiovascular function study: Secondary | ICD-10-CM | POA: Diagnosis present

## 2016-03-22 LAB — MYOCARDIAL PERFUSION IMAGING
Estimated workload: 7 METS
Exercise duration (min): 6 min
Exercise duration (sec): 0 s
LV dias vol: 114 mL (ref 46–106)
LV sys vol: 43 mL
MPHR: 165 {beats}/min
Peak HR: 148 {beats}/min
Percent HR: 89 %
Percent of predicted max HR: 89 %
RATE: 0.32
Rest HR: 78 {beats}/min
SDS: 1
SRS: 1
SSS: 2
Stage 1 DBP: 87 mmHg
Stage 1 Grade: 0 %
Stage 1 HR: 90 {beats}/min
Stage 1 SBP: 155 mmHg
Stage 1 Speed: 0 mph
Stage 2 DBP: 88 mmHg
Stage 2 Grade: 0 %
Stage 2 HR: 74 {beats}/min
Stage 2 SBP: 154 mmHg
Stage 2 Speed: 0 mph
Stage 3 Grade: 0.1 %
Stage 3 HR: 74 {beats}/min
Stage 3 Speed: 0 mph
Stage 4 DBP: 70 mmHg
Stage 4 Grade: 10 %
Stage 4 HR: 122 {beats}/min
Stage 4 SBP: 149 mmHg
Stage 4 Speed: 1.7 mph
Stage 5 DBP: 65 mmHg
Stage 5 Grade: 12 %
Stage 5 HR: 148 {beats}/min
Stage 5 SBP: 161 mmHg
Stage 5 Speed: 2.5 mph
Stage 6 Grade: 12 %
Stage 6 HR: 148 {beats}/min
Stage 6 Speed: 2.5 mph
Stage 7 DBP: 69 mmHg
Stage 7 Grade: 0 %
Stage 7 HR: 117 {beats}/min
Stage 7 SBP: 154 mmHg
Stage 7 Speed: 0 mph
Stage 8 DBP: 71 mmHg
Stage 8 Grade: 0 %
Stage 8 HR: 83 {beats}/min
Stage 8 SBP: 144 mmHg
Stage 8 Speed: 0 mph
TID: 0.92

## 2016-03-22 MED ORDER — TECHNETIUM TC 99M TETROFOSMIN IV KIT
32.1000 | PACK | Freq: Once | INTRAVENOUS | Status: AC | PRN
  Administered 2016-03-22: 32.1 via INTRAVENOUS
  Filled 2016-03-22: qty 33

## 2016-03-22 MED ORDER — TECHNETIUM TC 99M TETROFOSMIN IV KIT
10.6000 | PACK | Freq: Once | INTRAVENOUS | Status: AC | PRN
  Administered 2016-03-22: 10.6 via INTRAVENOUS
  Filled 2016-03-22: qty 11

## 2016-03-22 NOTE — Telephone Encounter (Signed)
Lmtcb to go over Myoview results and recommendations.

## 2016-03-23 ENCOUNTER — Telehealth: Payer: Self-pay | Admitting: *Deleted

## 2016-03-23 NOTE — Telephone Encounter (Signed)
Ptcb and I went over Myoview results and findings with the pt. I advised pt per Brynda Rim. PA need to bring pt in for ov to discuss results further as well as setting up a heart cath. I advised pt per Brynda Rim. PA he said he could see her tomorrow 2/28 @ 3:45. I asked if she could come in 12:45 Friday 3/2. Pt was concerned she would not have time to get to work. I asked pt from our office how far is her job. Pt answered she works in [employer].  Pt said she just started a new job and works 2nd shift and she cannot come in at that time, since she begins work at 2:30 pm. Pt asked for a morning appt. I explained to the pt I will d/w Brynda Rim. PA to see where he would like for her to come in since I do not see anything available. I advised I will call her back once I d/w further with the PA about a time for her appt. Pt said ok and thank you.

## 2016-03-23 NOTE — Telephone Encounter (Signed)
Lmtcb x 2 to go over Myoview results and recommendations.

## 2016-03-23 NOTE — Telephone Encounter (Signed)
See previous phone note from earlier today about test results and appt needed. Per [place] ok to see pt next week. I lmtcb about appt that I scheduled today for 04/02/16 @ 9:40 with Brynda Rim. PA to discuss Myoview results further as well as possible heart cath. Asked ptcb to either confirm this appt or if she needs to change the date and time to please let us know.

## 2016-03-23 NOTE — Telephone Encounter (Signed)
Patient is returning your call.    Thanks

## 2016-03-23 NOTE — Telephone Encounter (Signed)
Pt did return my call though I was not available at the time. I then returned pt call and lmtcb .

## 2016-03-30 ENCOUNTER — Telehealth: Payer: Self-pay | Admitting: *Deleted

## 2016-03-30 NOTE — Telephone Encounter (Signed)
Lmtcb again trying to reach pt to confirm that she did get my other messages about her appt 04/02/16. See previous phone notes and myoview results.

## 2016-04-01 NOTE — Progress Notes (Signed)
Cardiology Office Note:    Date:  04/02/2016   ID:  Cynthia Frye, DOB [birth year], MRN [id number]  PCP:  Henrine Screws, MD  Cardiologist:  Dr. Allegra Lai   Electrophysiologist:  n/a  Referring MD: Josetta Huddle, MD   Chief Complaint  Patient presents with  . Follow-up    abnormal stress test    History of Present Illness:    Cynthia Frye is a 56 y.o. female with a hx of  HTN, Lupus, obesity, asthma.  She was seen in the office 03/16/16 for a treadmill stress test that was arranged by her PCP.  Her ETT was markedly abnormal.  She only complained of intermittent L arm pain.  She denied chest pain.  I therefore set her up for an ETT-myoview.  This was intermediate risk and suggests apical and distal septal ischemia.  She is brought back to discuss her stress test results.  She is here with her husband.  Since last seen, she notes occasional chest pain that seems to be more positional.  She denies syncope, orthopnea, PND, edema.  She denies significant dyspnea on exertion.    Prior CV studies:   The following studies were reviewed today:  ETT-Myoview 2/18:  EF 62, + ECG response; apical and distal septal ischemia; intermediate risk.  ETT 03/16/16 Baseline ECG with NSR, HR 84, normal axis, no ST-T wave changes With exercise, there was early development of 2-3 mm of downsloping inferolateral ST depression (early stage II). Test was stopped due to EKG changes. The patient did not have any arm symptoms or chest symptoms. Blood pressure response to exercise was equivocal. Heart rate did recover rapidly in recovery with eventual resolution of ST changes by 3 minutes.  Echo 02/25/16 EF 55-60, no RWMA, normal diastolic function, trivial AI, PASP 32  Past Medical History:  Diagnosis Date  . Asthma    allergy shots and medication  . DDD (degenerative disc disease), lumbar   . Fibroids   . Heart murmur    Echo 1/18: EF 55-60, no RWMA, normal diastolic function, trivial AI, PASP  32  . History of nuclear stress test    ETT-Myoview 2/18: EF 62, + ECG response; apical and distal septal ischemia; intermediate risk.  Marland Kitchen Hypertension   . Iron deficiency anemia   . Lupus 2011   Dr. Trudie Reed  . Obesity   . Seasonal allergies   . Seizure in childhood [facility])    as a child no treatment none x 30 years    Past Surgical History:  Procedure Laterality Date  . ABDOMINAL HYSTERECTOMY    . BACK SURGERY  2009  . COLONOSCOPY WITH PROPOFOL N/A 04/11/2012   Procedure: COLONOSCOPY WITH PROPOFOL;  Surgeon: Garlan Fair, MD;  Location: [facility];  Service: Endoscopy;  Laterality: N/A;  . HERNIA REPAIR     as child unbilical  . MOUTH SURGERY      Current Medications: Current Meds  Medication Sig  . acetaminophen-codeine (TYLENOL #3) 300-30 MG per tablet Take 1-2 tablets by mouth every 4 (four) hours as needed for moderate pain.  Marland Kitchen amLODipine (NORVASC) 5 MG tablet Take 5 mg by mouth daily.  Marland Kitchen aspirin 81 MG tablet Take 81 mg by mouth daily.  . beclomethasone (QVAR) 80 MCG/ACT inhaler Inhale into the lungs 2 (two) times daily.  . benzonatate (TESSALON) 100 MG capsule Take 1 capsule (100 mg total) by mouth 3 (three) times daily as needed for cough.  . cholecalciferol (VITAMIN D) 1000  UNITS tablet Take 1,000 Units by mouth daily.  . diclofenac (VOLTAREN) 75 MG EC tablet Take 75 mg by mouth 2 (two) times daily.  . diphenhydrAMINE (BENADRYL) 25 MG tablet Take 25 mg by mouth every 6 (six) hours as needed.  . DIPROLENE 0.05 % ointment Apply 1 application topically as needed (SKIN IRRITATION).   . ferrous sulfate 325 (65 FE) MG tablet Take 325 mg by mouth daily with breakfast.  . fexofenadine-pseudoephedrine (ALLEGRA-D 24) 180-240 MG per 24 hr tablet Take 1 tablet by mouth daily.  . HYDROcodone-homatropine (HYCODAN) 5-1.5 MG/5ML syrup 5m by mouth a bedtime as needed for cough.  . hydroxychloroquine (PLAQUENIL) 200 MG tablet TAKE 2 TABLETS BY MOUTH ONCE A DAY WITH FOOD OR MILK  .  losartan (COZAAR) 50 MG tablet Take 50 mg by mouth daily.  . methocarbamol (ROBAXIN) 500 MG tablet TAKE 1 TABLET AT BEDTIME AS NEEDED FOR NECK AND SHOULDER PAIN ORALLY 30 DAY(S)  . methotrexate (RHEUMATREX) 2.5 MG tablet Take 25 mg by mouth once a week. Caution:Chemotherapy. Protect from light.  . methotrexate 50 MG/2ML injection INJECT 1 ML ONCE WEEKLY OR AS DIRECTED  . metoprolol succinate (TOPROL-XL) 25 MG 24 hr tablet Take 25 mg by mouth daily.  . oxyCODONE-acetaminophen (PERCOCET/ROXICET) 5-325 MG per tablet Take 1 tablet by mouth as needed.  . promethazine-dextromethorphan (PROMETHAZINE-DM) 6.25-15 MG/5ML syrup Take 5 mLs by mouth 4 (four) times daily as needed for cough.     Allergies:   Patient has no known allergies.   Social History   Social History  . Marital status: Married    Spouse name: N/A  . Number of children: N/A  . Years of education: N/A   Social History Main Topics  . Smoking status: Never Smoker  . Smokeless tobacco: Never Used  . Alcohol use No  . Drug use: No  . Sexual activity: Not Asked   Other Topics Concern  . 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 Asthma Mother   . Diabetes Mother   . Heart attack Father 36  . Stroke Maternal Aunt      ROS:   Please see the history of present illness.    ROS All other systems reviewed and are negative.   EKGs/Labs/Other Test Reviewed:    EKG:  EKG is  ordered today.  The ekg ordered today demonstrates NSR, HR 78, normal axis, septal Q waves, QTc 444 ms  Recent Labs: No results found for requested labs within last 8760 hours.   Recent Lipid Panel No results found for: CHOL, TRIG, HDL, CHOLHDL, VLDL, LDLCALC, LDLDIRECT   Physical Exam:    VS:  BP (!) 152/80   Pulse 78   Ht 5' 7" (1.702 m)   Wt 211 lb  1.9 oz (95.8 kg)   BMI 33.07 kg/m     Wt Readings from Last 3 Encounters:  04/02/16 211 lb 1.9 oz (95.8 kg)  03/22/16 214 lb (97.1 kg)  03/16/16 214 lb 12.8 oz (97.4 kg)     Physical Exam  Constitutional: She is oriented to person, place, and time. She appears well-developed and well-nourished. No distress.  HENT:  Head: Normocephalic and atraumatic.  Eyes: No scleral icterus.  Neck: Normal range of motion. No JVD   present.  Cardiovascular: Normal rate, regular rhythm, S1 normal, S2 normal and normal heart sounds.   No murmur heard. Pulmonary/Chest: Breath sounds normal. She has no wheezes. She has no rhonchi. She has no rales.  Abdominal: Soft. There is no tenderness.  Musculoskeletal: She exhibits no edema.  Neurological: She is alert and oriented to person, place, and time.  Skin: Skin is warm and dry.  Psychiatric: She has a normal mood and affect.    ASSESSMENT:    1. Abnormal stress test   2. Essential hypertension    PLAN:    In order of problems listed above:  1. Abnormal stress test She has had fairly atypical symptoms with occ L arm pain as well as occ non-exertional chest pain.  She has had normal resting ECGs. But, she has had a markedly positive ETT and, now, an intermediate risk ETT-Nuclear study.  Options of Coronary CTA vs Cardiac cath have been d/w the patient and her husband. Dr. Saunders Revel also met with them and discussed her options.  We have recommended proceeding with cardiac cath.  Risks and benefits of cardiac catheterization have been discussed with the patient.  These include bleeding, infection, kidney damage, stroke, heart attack, death.  The patient understands these risks and is willing to proceed.  -  Arrange LHC with Dr. Harrell Gave End in next 1 week.  -  Continue beta-blocker, ASA.  2. Essential hypertension BP elevated. She feels like she has been more nervous since her stress test came back.  Continue Toprol, Losartan, Amlodipine.  Continue to  monitor.    Dispo:  Return in about 2 weeks (around 04/16/2016) for Post Procedure Follow Up, w/ Richardson Dopp, PA-C.   Medication Adjustments/Labs and Tests Ordered: Current medicines are reviewed at length with the patient today.  Concerns regarding medicines are outlined above.  Medication changes, Labs and Tests ordered today are outlined in the Patient Instructions noted below. Patient Instructions  Medication Instructions:  Your physician recommends that you continue on your current medications as directed. Please refer to the Current Medication list given to you today.  Labwork: TODAY BMET, CBC, PT/INR  Testing/Procedures: Your physician has requested that you have a cardiac catheterization. Cardiac catheterization is used to diagnose and/or treat various heart conditions. Doctors may recommend this procedure for a number of different reasons. The most common reason is to evaluate chest pain. Chest pain can be a symptom of coronary artery disease (CAD), and cardiac catheterization can show whether plaque is narrowing or blocking your heart's arteries. This procedure is also used to evaluate the valves, as well as measure the blood flow and oxygen levels in different parts of your heart. For further information please visit HugeFiesta.[email or web address]. Please follow instruction sheet, as given.  Follow-Up: 04/23/16 @ 9:45 WITH Ed Rayson, PAC POST PROCEDURE FOLLOW UP   Any Other Special Instructions Will Be Listed Below (If Applicable). If you need a refill on your cardiac medications before your next appointment, please call your pharmacy.  Signed, Richardson Dopp, PA-C  04/02/2016 12:33 PM    [facility] [address], [place], [place]  [address] Phone: [phone]; Fax: [phone]

## 2016-04-02 ENCOUNTER — Ambulatory Visit (INDEPENDENT_AMBULATORY_CARE_PROVIDER_SITE_OTHER): Payer: BLUE CROSS/BLUE SHIELD | Admitting: Physician Assistant

## 2016-04-02 ENCOUNTER — Encounter (INDEPENDENT_AMBULATORY_CARE_PROVIDER_SITE_OTHER): Payer: Self-pay

## 2016-04-02 ENCOUNTER — Encounter: Payer: Self-pay | Admitting: Physician Assistant

## 2016-04-02 VITALS — BP 152/80 | HR 78 | Ht 67.0 in | Wt 211.1 lb

## 2016-04-02 DIAGNOSIS — I10 Essential (primary) hypertension: Secondary | ICD-10-CM

## 2016-04-02 DIAGNOSIS — R9439 Abnormal result of other cardiovascular function study: Secondary | ICD-10-CM

## 2016-04-02 NOTE — Patient Instructions (Addendum)
Medication Instructions:  Your physician recommends that you continue on your current medications as directed. Please refer to the Current Medication list given to you today.  Labwork: TODAY BMET, CBC, PT/INR  Testing/Procedures: Your physician has requested that you have a cardiac catheterization. Cardiac catheterization is used to diagnose and/or treat various heart conditions. Doctors may recommend this procedure for a number of different reasons. The most common reason is to evaluate chest pain. Chest pain can be a symptom of coronary artery disease (CAD), and cardiac catheterization can show whether plaque is narrowing or blocking your heart's arteries. This procedure is also used to evaluate the valves, as well as measure the blood flow and oxygen levels in different parts of your heart. For further information please visit HugeFiesta.tn. Please follow instruction sheet, as given.  Follow-Up: 04/23/16 @ 9:45 WITH SCOTT WEAVER, PAC POST PROCEDURE FOLLOW UP   Any Other Special Instructions Will Be Listed Below (If Applicable). If you need a refill on your cardiac medications before your next appointment, please call your pharmacy.  -  [facility] [address], [address] [place] [address] Dept: [phone] Loc: [phone]  KENZA MUNAR  04/02/2016  You are scheduled for a Cardiac Catheterization on Friday, March 16 with Dr. Harrell Gave End @ 11 AM.  1. Please arrive at the [facility] (Main Entrance A) at [facility]: [place],  [address] at 9:00 AM (two hours before your procedure to ensure your preparation). Free valet parking service is available.   Special note: Every effort is made to have your procedure done on time. Please understand that emergencies sometimes delay scheduled procedures.  2. Diet: Do not eat or drink anything after midnight prior to  your procedure except sips of water to take medications.  3. Labs: You will need to have blood drawn on Friday, March 9 at [facility] at [facility]. [address]  Open: 7:30am - 5pm    Phone: [phone]. You do not need to be fasting.  4. Medication instructions in preparation for your procedure:   On the morning of your procedure, take your Aspirin 81 MG THE MORNING OF CATH and any morning medicines NOT listed above.  You may use sips of water.  5. Plan for one night stay--bring personal belongings. 6. Bring a current list of your medications and current insurance cards. 7. You MUST have a responsible person to drive you home. 8. Someone MUST be with you the first 24 hours after you arrive home or your discharge will be delayed. 9. Please wear clothes that are easy to get on and off and wear slip-on shoes.  Thank you for allowing [place] to care for you!   -- [facility]

## 2016-04-04 LAB — CBC
Hematocrit: 36.5 % (ref 34.0–46.6)
Hemoglobin: 12.5 g/dL (ref 11.1–15.9)
MCH: 30.4 pg (ref 26.6–33.0)
MCHC: 34.2 g/dL (ref 31.5–35.7)
MCV: 89 fL (ref 79–97)
Platelets: 211 10*3/uL (ref 150–379)
RBC: 4.11 x10E6/uL (ref 3.77–5.28)
RDW: 14.6 % (ref 12.3–15.4)
WBC: 2.5 10*3/uL — CL (ref 3.4–10.8)

## 2016-04-04 LAB — BASIC METABOLIC PANEL
BUN/Creatinine Ratio: 13 (ref 9–23)
BUN: 10 mg/dL (ref 6–24)
CO2: 24 mmol/L (ref 18–29)
Calcium: 10 mg/dL (ref 8.7–10.2)
Chloride: 104 mmol/L (ref 96–106)
Creatinine, Ser: 0.75 mg/dL (ref 0.57–1.00)
GFR calc Af Amer: 104 mL/min/{1.73_m2} (ref >59)
GFR calc non Af Amer: 90 mL/min/{1.73_m2} (ref >59)
Glucose: 89 mg/dL (ref 65–99)
Potassium: 4.2 mmol/L (ref 3.5–5.2)
Sodium: 142 mmol/L (ref 134–144)

## 2016-04-04 LAB — PROTIME-INR
INR: 1 (ref 0.8–1.2)
Prothrombin Time: 10.5 s (ref 9.1–12.0)

## 2016-04-07 ENCOUNTER — Telehealth: Payer: Self-pay | Admitting: *Deleted

## 2016-04-07 NOTE — Telephone Encounter (Signed)
DPR ok to lmom. Lab work ok and pt ok to proceed with cath on 04/09/16 with Dr. Saunders Revel. Any questions call back [phone].

## 2016-04-08 ENCOUNTER — Telehealth: Payer: Self-pay | Admitting: Physician Assistant

## 2016-04-08 DIAGNOSIS — N390 Urinary tract infection, site not specified: Secondary | ICD-10-CM | POA: Diagnosis not present

## 2016-04-08 DIAGNOSIS — M79602 Pain in left arm: Secondary | ICD-10-CM | POA: Diagnosis not present

## 2016-04-08 DIAGNOSIS — E669 Obesity, unspecified: Secondary | ICD-10-CM | POA: Diagnosis not present

## 2016-04-08 DIAGNOSIS — Z6833 Body mass index (BMI) 33.0-33.9, adult: Secondary | ICD-10-CM | POA: Diagnosis not present

## 2016-04-08 DIAGNOSIS — I10 Essential (primary) hypertension: Secondary | ICD-10-CM | POA: Diagnosis not present

## 2016-04-08 DIAGNOSIS — M542 Cervicalgia: Secondary | ICD-10-CM | POA: Diagnosis not present

## 2016-04-08 DIAGNOSIS — Z79899 Other long term (current) drug therapy: Secondary | ICD-10-CM | POA: Diagnosis not present

## 2016-04-08 NOTE — Telephone Encounter (Signed)
Left message for patient to call back  

## 2016-04-08 NOTE — Telephone Encounter (Signed)
New message     Pt states she needs doctors notes to return to work for her and her husband , she needs them to be ready 04/09/16 when she gets to her procedure.

## 2016-04-09 ENCOUNTER — Encounter (HOSPITAL_COMMUNITY)
Admission: RE | Disposition: A | Discharge status: Home / Self Care | Payer: Self-pay | Source: Ambulatory Visit | Attending: Internal Medicine

## 2016-04-09 ENCOUNTER — Ambulatory Visit (HOSPITAL_COMMUNITY)
Admission: RE | Admit: 2016-04-09 | Discharge: 2016-04-09 | Disposition: A | Discharge status: Home / Self Care | Payer: BLUE CROSS/BLUE SHIELD | Source: Ambulatory Visit | Attending: Internal Medicine | Admitting: Internal Medicine

## 2016-04-09 DIAGNOSIS — R9439 Abnormal result of other cardiovascular function study: Secondary | ICD-10-CM | POA: Diagnosis not present

## 2016-04-09 DIAGNOSIS — Z823 Family history of stroke: Secondary | ICD-10-CM | POA: Insufficient documentation

## 2016-04-09 DIAGNOSIS — J45909 Unspecified asthma, uncomplicated: Secondary | ICD-10-CM | POA: Diagnosis not present

## 2016-04-09 DIAGNOSIS — M329 Systemic lupus erythematosus, unspecified: Secondary | ICD-10-CM | POA: Diagnosis not present

## 2016-04-09 DIAGNOSIS — Z8249 Family history of ischemic heart disease and other diseases of the circulatory system: Secondary | ICD-10-CM | POA: Insufficient documentation

## 2016-04-09 DIAGNOSIS — I10 Essential (primary) hypertension: Secondary | ICD-10-CM | POA: Insufficient documentation

## 2016-04-09 DIAGNOSIS — Z833 Family history of diabetes mellitus: Secondary | ICD-10-CM | POA: Insufficient documentation

## 2016-04-09 DIAGNOSIS — M5136 Other intervertebral disc degeneration, lumbar region: Secondary | ICD-10-CM | POA: Insufficient documentation

## 2016-04-09 DIAGNOSIS — Z7982 Long term (current) use of aspirin: Secondary | ICD-10-CM | POA: Diagnosis not present

## 2016-04-09 DIAGNOSIS — E669 Obesity, unspecified: Secondary | ICD-10-CM | POA: Diagnosis not present

## 2016-04-09 DIAGNOSIS — Z6833 Body mass index (BMI) 33.0-33.9, adult: Secondary | ICD-10-CM | POA: Insufficient documentation

## 2016-04-09 DIAGNOSIS — M79602 Pain in left arm: Secondary | ICD-10-CM | POA: Diagnosis not present

## 2016-04-09 DIAGNOSIS — D509 Iron deficiency anemia, unspecified: Secondary | ICD-10-CM | POA: Insufficient documentation

## 2016-04-09 HISTORY — PX: LEFT HEART CATH AND CORONARY ANGIOGRAPHY: CATH118249

## 2016-04-09 SURGERY — LEFT HEART CATH AND CORONARY ANGIOGRAPHY
Anesthesia: LOCAL

## 2016-04-09 MED ORDER — VERAPAMIL HCL 2.5 MG/ML IV SOLN
INTRAVENOUS | Status: AC
  Filled 2016-04-09: qty 2

## 2016-04-09 MED ORDER — FENTANYL CITRATE (PF) 100 MCG/2ML IJ SOLN
INTRAMUSCULAR | Status: DC | PRN
  Administered 2016-04-09: 25 ug via INTRAVENOUS
  Administered 2016-04-09: 50 ug via INTRAVENOUS

## 2016-04-09 MED ORDER — LIDOCAINE HCL (PF) 1 % IJ SOLN
INTRAMUSCULAR | Status: DC | PRN
  Administered 2016-04-09: 2 mL via SUBCUTANEOUS

## 2016-04-09 MED ORDER — HEPARIN SODIUM (PORCINE) 1000 UNIT/ML IJ SOLN
INTRAMUSCULAR | Status: DC | PRN
  Administered 2016-04-09: 5000 [IU] via INTRAVENOUS

## 2016-04-09 MED ORDER — SODIUM CHLORIDE 0.9 % IV SOLN: 250.0000 mL | INTRAVENOUS | Status: DC | PRN

## 2016-04-09 MED ORDER — SODIUM CHLORIDE 0.9 % WEIGHT BASED INFUSION: 1.0000 mL/kg/h | INTRAVENOUS | Status: DC

## 2016-04-09 MED ORDER — SODIUM CHLORIDE 0.9% FLUSH: 3.0000 mL | INTRAVENOUS | Status: DC | PRN

## 2016-04-09 MED ORDER — LIDOCAINE HCL (PF) 1 % IJ SOLN
INTRAMUSCULAR | Status: AC
  Filled 2016-04-09: qty 30

## 2016-04-09 MED ORDER — SODIUM CHLORIDE 0.9 % WEIGHT BASED INFUSION
3.0000 mL/kg/h | INTRAVENOUS | Status: DC
  Administered 2016-04-09: 3 mL/kg/h via INTRAVENOUS

## 2016-04-09 MED ORDER — DIAZEPAM 5 MG PO TABS
ORAL_TABLET | ORAL | Status: AC
  Filled 2016-04-09: qty 1

## 2016-04-09 MED ORDER — FENTANYL CITRATE (PF) 100 MCG/2ML IJ SOLN
INTRAMUSCULAR | Status: AC
Start: 2016-04-09 — End: ?
  Filled 2016-04-09: qty 2

## 2016-04-09 MED ORDER — HEPARIN (PORCINE) IN NACL 2-0.9 UNIT/ML-% IJ SOLN
INTRAMUSCULAR | Status: AC
Start: 2016-04-09 — End: ?
  Filled 2016-04-09: qty 500

## 2016-04-09 MED ORDER — IOPAMIDOL (ISOVUE-370) INJECTION 76%
INTRAVENOUS | Status: AC
  Filled 2016-04-09: qty 100

## 2016-04-09 MED ORDER — SODIUM CHLORIDE 0.9 % IV SOLN: INTRAVENOUS | Status: DC

## 2016-04-09 MED ORDER — IOPAMIDOL (ISOVUE-370) INJECTION 76%
INTRAVENOUS | Status: DC | PRN
  Administered 2016-04-09: 50 mL via INTRA_ARTERIAL

## 2016-04-09 MED ORDER — SODIUM CHLORIDE 0.9% FLUSH: 3.0000 mL | Freq: Two times a day (BID) | INTRAVENOUS | Status: DC

## 2016-04-09 MED ORDER — HEPARIN (PORCINE) IN NACL 2-0.9 UNIT/ML-% IJ SOLN
INTRAMUSCULAR | Status: AC
  Filled 2016-04-09: qty 500

## 2016-04-09 MED ORDER — MIDAZOLAM HCL 2 MG/2ML IJ SOLN
INTRAMUSCULAR | Status: DC | PRN
  Administered 2016-04-09 (×2): 1 mg via INTRAVENOUS

## 2016-04-09 MED ORDER — HEPARIN (PORCINE) IN NACL 2-0.9 UNIT/ML-% IJ SOLN
INTRAMUSCULAR | Status: DC | PRN
  Administered 2016-04-09: 1000 mL

## 2016-04-09 MED ORDER — MIDAZOLAM HCL 2 MG/2ML IJ SOLN
INTRAMUSCULAR | Status: AC
  Filled 2016-04-09: qty 2

## 2016-04-09 MED ORDER — VERAPAMIL HCL 2.5 MG/ML IV SOLN
INTRAVENOUS | Status: DC | PRN
  Administered 2016-04-09: 10 mL via INTRA_ARTERIAL

## 2016-04-09 MED ORDER — DIAZEPAM 5 MG PO TABS
5.0000 mg | ORAL_TABLET | Freq: Once | ORAL | Status: AC
Start: 2016-04-09 — End: 2016-04-09
  Administered 2016-04-09: 5 mg via ORAL

## 2016-04-09 SURGICAL SUPPLY — 13 items
CATH INFINITI 5 FR JL3.5 (CATHETERS) ×2 IMPLANT
CATH INFINITI 5FR ANG PIGTAIL (CATHETERS) ×2 IMPLANT
CATH OPTITORQUE TIG 4.0 5F (CATHETERS) ×2 IMPLANT
DEVICE RAD COMP TR BAND LRG (VASCULAR PRODUCTS) ×2 IMPLANT
GLIDESHEATH SLEND SS 6F .021 (SHEATH) ×2 IMPLANT
GUIDEWIRE INQWIRE 1.5J.035X260 (WIRE) ×1 IMPLANT
INQWIRE 1.5J .035X260CM (WIRE) ×2
KIT HEART LEFT (KITS) ×2 IMPLANT
PACK CARDIAC CATHETERIZATION (CUSTOM PROCEDURE TRAY) ×2 IMPLANT
SYR MEDRAD MARK V 150ML (SYRINGE) ×2 IMPLANT
TRANSDUCER W/STOPCOCK (MISCELLANEOUS) ×2 IMPLANT
TUBING CIL FLEX 10 FLL-RA (TUBING) ×2 IMPLANT
WIRE HI TORQ VERSACORE-J 145CM (WIRE) ×2 IMPLANT

## 2016-04-09 NOTE — Progress Notes (Addendum)
Do not give aspirin due to hives with corn-containing products per Anderson Malta, RN. Pt takes aspirin as part of her normal regimen, takes without difficulty and took this morning at 0830, 81 mg.

## 2016-04-09 NOTE — H&P (View-Only) (Signed)
Cardiology Office Note:    Date:  04/02/2016   ID:  [name], DOB [birth year], MRN [id number]  PCP:  Henrine Screws, MD  Cardiologist:  Dr. Allegra Lai   Electrophysiologist:  n/a  Referring MD: Josetta Huddle, MD   Chief Complaint  Patient presents with  . Follow-up    abnormal stress test    History of Present Illness:    Cynthia Frye is a 56 y.o. female with a hx of  HTN, Lupus, obesity, asthma.  She was seen in the office 03/16/16 for a treadmill stress test that was arranged by her PCP.  Her ETT was markedly abnormal.  She only complained of intermittent L arm pain.  She denied chest pain.  I therefore set her up for an ETT-myoview.  This was intermediate risk and suggests apical and distal septal ischemia.  She is brought back to discuss her stress test results.  She is here with her husband.  Since last seen, she notes occasional chest pain that seems to be more positional.  She denies syncope, orthopnea, PND, edema.  She denies significant dyspnea on exertion.    Prior CV studies:   The following studies were reviewed today:  ETT-Myoview 2/18:  EF 62, + ECG response; apical and distal septal ischemia; intermediate risk.  ETT 03/16/16 Baseline ECG with NSR, HR 84, normal axis, no ST-T wave changes With exercise, there was early development of 2-3 mm of downsloping inferolateral ST depression (early stage II). Test was stopped due to EKG changes. The patient did not have any arm symptoms or chest symptoms. Blood pressure response to exercise was equivocal. Heart rate did recover rapidly in recovery with eventual resolution of ST changes by 3 minutes.  Echo 02/25/16 EF 55-60, no RWMA, normal diastolic function, trivial AI, PASP 32  Past Medical History:  Diagnosis Date  . Asthma    allergy shots and medication  . DDD (degenerative disc disease), lumbar   . Fibroids   . Heart murmur    Echo 1/18: EF 55-60, no RWMA, normal diastolic function, trivial AI, PASP  32  . History of nuclear stress test    ETT-Myoview 2/18: EF 62, + ECG response; apical and distal septal ischemia; intermediate risk.  Marland Kitchen Hypertension   . Iron deficiency anemia   . Lupus 2011   Dr. Trudie Reed  . Obesity   . Seasonal allergies   . Seizure in childhood [facility])    as a child no treatment none x 30 years    Past Surgical History:  Procedure Laterality Date  . ABDOMINAL HYSTERECTOMY    . BACK SURGERY  2009  . COLONOSCOPY WITH PROPOFOL N/A 04/11/2012   Procedure: COLONOSCOPY WITH PROPOFOL;  Surgeon: Garlan Fair, MD;  Location: [facility]COPY;  Service: Endoscopy;  Laterality: N/A;  . HERNIA REPAIR     as child unbilical  . MOUTH SURGERY      Current Medications: Current Meds  Medication Sig  . acetaminophen-codeine (TYLENOL #3) 300-30 MG per tablet Take 1-2 tablets by mouth every 4 (four) hours as needed for moderate pain.  Marland Kitchen amLODipine (NORVASC) 5 MG tablet Take 5 mg by mouth daily.  Marland Kitchen aspirin 81 MG tablet Take 81 mg by mouth daily.  . beclomethasone (QVAR) 80 MCG/ACT inhaler Inhale into the lungs 2 (two) times daily.  . benzonatate (TESSALON) 100 MG capsule Take 1 capsule (100 mg total) by mouth 3 (three) times daily as needed for cough.  . cholecalciferol (VITAMIN D) 1000  UNITS tablet Take 1,000 Units by mouth daily.  . diclofenac (VOLTAREN) 75 MG EC tablet Take 75 mg by mouth 2 (two) times daily.  . diphenhydrAMINE (BENADRYL) 25 MG tablet Take 25 mg by mouth every 6 (six) hours as needed.  Marland Kitchen DIPROLENE 0.05 % ointment Apply 1 application topically as needed (SKIN IRRITATION).   . ferrous sulfate 325 (65 FE) MG tablet Take 325 mg by mouth daily with breakfast.  . fexofenadine-pseudoephedrine (ALLEGRA-D 24) 180-240 MG per 24 hr tablet Take 1 tablet by mouth daily.  Marland Kitchen HYDROcodone-homatropine (HYCODAN) 5-1.5 MG/5ML syrup 33mby mouth a bedtime as needed for cough.  . hydroxychloroquine (PLAQUENIL) 200 MG tablet TAKE 2 TABLETS BY MOUTH ONCE A DAY WITH FOOD OR MILK  .  losartan (COZAAR) 50 MG tablet Take 50 mg by mouth daily.  . methocarbamol (ROBAXIN) 500 MG tablet TAKE 1 TABLET AT BEDTIME AS NEEDED FOR NECK AND SHOULDER PAIN ORALLY 30 DAY(S)  . methotrexate (RHEUMATREX) 2.5 MG tablet Take 25 mg by mouth once a week. Caution:Chemotherapy. Protect from light.  . methotrexate 50 MG/2ML injection INJECT 1 ML ONCE WEEKLY OR AS DIRECTED  . metoprolol succinate (TOPROL-XL) 25 MG 24 hr tablet Take 25 mg by mouth daily.  .Marland KitchenoxyCODONE-acetaminophen (PERCOCET/ROXICET) 5-325 MG per tablet Take 1 tablet by mouth as needed.  . promethazine-dextromethorphan (PROMETHAZINE-DM) 6.25-15 MG/5ML syrup Take 5 mLs by mouth 4 (four) times daily as needed for cough.     Allergies:   Patient has no known allergies.   Social History   Social History  . Marital status: Married    Spouse name: N/A  . Number of children: N/A  . Years of education: N/A   Social History Main Topics  . Smoking status: Never Smoker  . Smokeless tobacco: Never Used  . Alcohol use No  . Drug use: No  . Sexual activity: Not Asked   Other Topics Concern  . 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 Asthma Mother   . Diabetes Mother   . Heart attack Father 343 . Stroke Maternal Aunt      ROS:   Please see the history of present illness.    ROS All other systems reviewed and are negative.   EKGs/Labs/Other Test Reviewed:    EKG:  EKG is  ordered today.  The ekg ordered today demonstrates NSR, HR 78, normal axis, septal Q waves, QTc 444 ms  Recent Labs: No results found for requested labs within last 8760 hours.   Recent Lipid Panel No results found for: CHOL, TRIG, HDL, CHOLHDL, VLDL, LDLCALC, LDLDIRECT   Physical Exam:    VS:  BP (!) 152/80   Pulse 78   Ht _0  (1.702 m)   Wt 211 lb  1.9 oz (95.8 kg)   BMI 33.07 kg/m     Wt Readings from Last 3 Encounters:  04/02/16 211 lb 1.9 oz (95.8 kg)  03/22/16 214 lb (97.1 kg)  03/16/16 214 lb 12.8 oz (97.4 kg)     Physical Exam  Constitutional: She is oriented to person, place, and time. She appears well-developed and well-nourished. No distress.  HENT:  Head: Normocephalic and atraumatic.  Eyes: No scleral icterus.  Neck: Normal range of motion. No JVD  present.  Cardiovascular: Normal rate, regular rhythm, S1 normal, S2 normal and normal heart sounds.   No murmur heard. Pulmonary/Chest: Breath sounds normal. She has no wheezes. She has no rhonchi. She has no rales.  Abdominal: Soft. There is no tenderness.  Musculoskeletal: She exhibits no edema.  Neurological: She is alert and oriented to person, place, and time.  Skin: Skin is warm and dry.  Psychiatric: She has a normal mood and affect.    ASSESSMENT:    1. Abnormal stress test   2. Essential hypertension    PLAN:    In order of problems listed above:  1. Abnormal stress test She has had fairly atypical symptoms with occ L arm pain as well as occ non-exertional chest pain.  She has had normal resting ECGs. But, she has had a markedly positive ETT and, now, an intermediate risk ETT-Nuclear study.  Options of Coronary CTA vs Cardiac cath have been d/w the patient and her husband. Dr. Saunders Revel also met with them and discussed her options.  We have recommended proceeding with cardiac cath.  Risks and benefits of cardiac catheterization have been discussed with the patient.  These include bleeding, infection, kidney damage, stroke, heart attack, death.  The patient understands these risks and is willing to proceed.  -  Arrange LHC with Dr. Harrell Gave End in next 1 week.  -  Continue beta-blocker, ASA.  2. Essential hypertension BP elevated. She feels like she has been more nervous since her stress test came back.  Continue Toprol, Losartan, Amlodipine.  Continue to  monitor.    Dispo:  Return in about 2 weeks (around 04/16/2016) for Post Procedure Follow Up, w/ Richardson Dopp, PA-C.   Medication Adjustments/Labs and Tests Ordered: Current medicines are reviewed at length with the patient today.  Concerns regarding medicines are outlined above.  Medication changes, Labs and Tests ordered today are outlined in the Patient Instructions noted below. Patient Instructions  Medication Instructions:  Your physician recommends that you continue on your current medications as directed. Please refer to the Current Medication list given to you today.  Labwork: TODAY BMET, CBC, PT/INR  Testing/Procedures: Your physician has requested that you have a cardiac catheterization. Cardiac catheterization is used to diagnose and/or treat various heart conditions. Doctors may recommend this procedure for a number of different reasons. The most common reason is to evaluate chest pain. Chest pain can be a symptom of coronary artery disease (CAD), and cardiac catheterization can show whether plaque is narrowing or blocking your heart's arteries. This procedure is also used to evaluate the valves, as well as measure the blood flow and oxygen levels in different parts of your heart. For further information please visit HugeFiesta.tn. Please follow instruction sheet, as given.  Follow-Up: 04/23/16 @ 9:45 WITH Kestrel Mis, PAC POST PROCEDURE FOLLOW UP   Any Other Special Instructions Will Be Listed Below (If Applicable). If you need a refill on your cardiac medications before your next appointment, please call your pharmacy.  Signed, Richardson Dopp, PA-C  04/02/2016 12:33 PM    [facility] [address], [place], [place]  [address] Phone: [phone]; Fax: [phone]

## 2016-04-09 NOTE — Interval H&P Note (Signed)
History and Physical Interval Note:  04/09/2016 12:06 PM  Cynthia Frye  has presented today for cardiac catheterization, with the diagnosis of atypical angina and abnormal stress test. The various methods of treatment have been discussed with the patient and family. After consideration of risks, benefits and other options for treatment, the patient has consented to  Procedure(s): Left Heart Cath and Coronary Angiography (N/A) as a surgical intervention .  The patient's history has been reviewed, patient examined, no change in status, stable for surgery.  I have reviewed the patient's chart and labs.  Questions were answered to the patient's satisfaction.    Cath Lab Visit (complete for each Cath Lab visit)  Clinical Evaluation Leading to the Procedure:   ACS: No.  Non-ACS:    Anginal Classification: CCS IV (left arm discomfort at rest; ? anginal equivalent)  Anti-ischemic medical therapy: Maximal Therapy (2 or more classes of medications)  Non-Invasive Test Results: Intermediate-risk stress test findings: cardiac mortality 1-3%/year  Prior CABG: No previous CABG  Dorn Hartshorne

## 2016-04-09 NOTE — Discharge Instructions (Signed)
Radial Site Care °Refer to this sheet in the next few weeks. These instructions provide you with information about caring for yourself after your procedure. Your health care provider may also give you more specific instructions. Your treatment has been planned according to current medical practices, but problems sometimes occur. Call your health care provider if you have any problems or questions after your procedure. °What can I expect after the procedure? °After your procedure, it is typical to have the following: °· Bruising at the radial site that usually fades within 1-2 weeks. °· Blood collecting in the tissue (hematoma) that may be painful to the touch. It should usually decrease in size and tenderness within 1-2 weeks. °Follow these instructions at home: °· Take medicines only as directed by your health care provider. °· You may shower 24-48 hours after the procedure or as directed by your health care provider. Remove the bandage (dressing) and gently wash the site with plain soap and water. Pat the area dry with a clean towel. Do not rub the site, because this may cause bleeding. °· Do not take baths, swim, or use a hot tub until your health care provider approves. °· Check your insertion site every day for redness, swelling, or drainage. °· Do not apply powder or lotion to the site. °· Do not flex or bend the affected arm for 24 hours or as directed by your health care provider. °· Do not push or pull heavy objects with the affected arm for 24 hours or as directed by your health care provider. °· Do not lift over 10 lb (4.5 kg) for 5 days after your procedure or as directed by your health care provider. °· Ask your health care provider when it is okay to: °¨ Return to work or school. °¨ Resume usual physical activities or sports. °¨ Resume sexual activity. °· Do not drive home if you are discharged the same day as the procedure. Have someone else drive you. °· You may drive 24 hours after the procedure  unless otherwise instructed by your health care provider. °· Do not operate machinery or power tools for 24 hours after the procedure. °· If your procedure was done as an outpatient procedure, which means that you went home the same day as your procedure, a responsible adult should be with you for the first 24 hours after you arrive home. °· Keep all follow-up visits as directed by your health care provider. This is important. °Contact a health care provider if: °· You have a fever. °· You have chills. °· You have increased bleeding from the radial site. Hold pressure on the site. °Get help right away if: °· You have unusual pain at the radial site. °· You have redness, warmth, or swelling at the radial site. °· You have drainage (other than a small amount of blood on the dressing) from the radial site. °· The radial site is bleeding, and the bleeding does not stop after 30 minutes of holding steady pressure on the site. °· Your arm or hand becomes pale, cool, tingly, or numb. °This information is not intended to replace advice given to you by your health care provider. Make sure you discuss any questions you have with your health care provider. °Document Released: 02/13/2010 Document Revised: 06/19/2015 Document Reviewed: 07/30/2013 °Elsevier Interactive Patient Education © 2017 [employer] Inc. ° °

## 2016-04-10 ENCOUNTER — Telehealth: Payer: Self-pay | Admitting: Physician Assistant

## 2016-04-10 NOTE — Telephone Encounter (Signed)
Cynthia Frye called because she wanted to know when she could take her arm down. It is getting stiff and sore from keeping it up all day.  She is not having any problems with her cath site. She has not yet remove the dressing. She is not having any chest pain or shortness of breath.  I advised that she could start moving her arm, but do not use her wrist much for the next couple of days to let it heal in well. If she has any problems with that she can call us. It is okay to remove the dressing and shower, but do not be rough with the site itself.  [name] 04/10/2016 5:57 PM Beeper [phone]

## 2016-04-11 ENCOUNTER — Telehealth: Payer: Self-pay | Admitting: Cardiovascular Disease

## 2016-04-11 ENCOUNTER — Telehealth: Payer: Self-pay | Admitting: Physician Assistant

## 2016-04-11 NOTE — Telephone Encounter (Signed)
Pt had heart cath (no PCI) on Friday, 03/16  Called 03/17 to see when she could move her arm (now).  Called 03/18 because her hand is stiff and a little puffy. Wants to know what she can do.  Advised ok to work the hand with a ball or anything she can squeeze. Because it was still for so long, it is normal for it to be stiff, it should improve rapidly.   No problems with the site.  Call back if it does not improve.  Rosaria Ferries, Hershal Coria 04/11/2016 2:50 PM Beeper [phone]

## 2016-04-11 NOTE — Telephone Encounter (Signed)
Pt called again this evening to update [place] on her right hand s/p cath two days ago.  She has been using a stress ball as instructed but states that she is "having trouble making a full fist". Denies pain in the fingers or hand, denies pallor or discoloration otherwise, has full sensation in all 5 digits, and she performed capillary refill testing on each digit while on the phone with me and reported that the color in her nail beds returned briskly.  I reassured her that based on these findings it did not seem as though she was suffering from ischemic symptoms, but did ask that she come to the ED for evaluation should weakness, pallor, pain, or paresthesias develop.  She expressed understanding and will call again tomorrow with another update.  Clayborne Dana MD

## 2016-04-12 ENCOUNTER — Encounter (HOSPITAL_COMMUNITY): Payer: Self-pay | Admitting: Internal Medicine

## 2016-04-12 ENCOUNTER — Ambulatory Visit (INDEPENDENT_AMBULATORY_CARE_PROVIDER_SITE_OTHER): Payer: 59 | Admitting: Physician Assistant

## 2016-04-12 ENCOUNTER — Encounter: Payer: Self-pay | Admitting: Physician Assistant

## 2016-04-12 VITALS — BP 145/86 | HR 70 | Ht 67.0 in | Wt 214.8 lb

## 2016-04-12 DIAGNOSIS — R9439 Abnormal result of other cardiovascular function study: Secondary | ICD-10-CM | POA: Diagnosis not present

## 2016-04-12 DIAGNOSIS — M25531 Pain in right wrist: Secondary | ICD-10-CM

## 2016-04-12 DIAGNOSIS — L930 Discoid lupus erythematosus: Secondary | ICD-10-CM

## 2016-04-12 DIAGNOSIS — I10 Essential (primary) hypertension: Secondary | ICD-10-CM

## 2016-04-12 NOTE — Telephone Encounter (Signed)
Patient should do light activities with the right arm. Vigorous squeezing of her "stress ball" should be avoided. If she remains concerned about the site, she should be seen by one of the APPs or myself (if she wishes to wait until Friday). Otherwise, please have her return to see Richardson Dopp or myself in 2-4 weeks. Thanks.

## 2016-04-12 NOTE — Progress Notes (Addendum)
Cardiology Office Note    Date:  04/12/2016   ID:  [name], DOB 1960/04/08, MRN [id number]  PCP:  Henrine Screws, MD  Cardiologist:  Richardson Dopp (appears the case has discussed with Dr. Allegra Lai before, however first general cardiologist who has seen the patient is Dr. Saunders Revel)  Chief Complaint  Patient presents with  . Follow-up    followup with Richardson Dopp, previous discussed with Dr. Curt Bears, recently underwent cath by Dr. Saunders Revel    History of Present Illness:  HIAWATHA DRESSEL is a 56 y.o. female with PMH of HTN, lupus, obesity and asthma. Her last echocardiogram obtained on 02/25/2016 showed EF 55-60%, no regional wall motion abnormality, PA peak pressure 32 mmHg. She was seen in the office on 03/16/2016 for treadmill stress test that was arranged by her PCP, ETT was markedly abnormal. She only complained of some intermittent left arm pain. She denies any chest pain. She was set up for a treadmill Myoview which came back intermediate risk suggesting of apical and distal septal ischemia. She was seen again by Richardson Dopp PA-C on 04/02/2016 and was set up for cardiac catheterization. Cardiac catheterization was performed on 04/09/2016 which showed no angiographic significant coronary artery disease, abnormal stress test likely false positive.  She has contacted after hour answering service several times since her discharge complaining of swelling of her right hand after recent cardiac catheterization. She presents today for office evaluation. On physical exam, she has 2+ radial artery pulse. She also have 2+ antecubital pulse as well. She says since her cardiac catheterization, she has only been able to squeeze her hand halfway. She also noted swelling in her digits as well. She complained of tingling sensation near the tip of second, third, and the fourth digits. There was no sign of compartment syndrome either as she has normal skin turgor. Rotation of the shoulder and elbow does not  induce any discomfort however rotation of the wrist does cause some pain.    Past Medical History:  Diagnosis Date  . Asthma    allergy shots and medication  . DDD (degenerative disc disease), lumbar   . Fibroids   . Heart murmur    Echo 1/18: EF 55-60, no RWMA, normal diastolic function, trivial AI, PASP 32  . History of nuclear stress test    ETT-Myoview 2/18: EF 62, + ECG response; apical and distal septal ischemia; intermediate risk.  Marland Kitchen Hypertension   . Iron deficiency anemia   . Lupus 2011   Dr. Trudie Reed  . Obesity   . Seasonal allergies   . Seizure in childhood [facility])    as a child no treatment none x 30 years    Past Surgical History:  Procedure Laterality Date  . ABDOMINAL HYSTERECTOMY    . BACK SURGERY  2009  . COLONOSCOPY WITH PROPOFOL N/A 04/11/2012   Procedure: COLONOSCOPY WITH PROPOFOL;  Surgeon: Garlan Fair, MD;  Location: [facility];  Service: Endoscopy;  Laterality: N/A;  . HERNIA REPAIR     as child unbilical  . LEFT HEART CATH AND CORONARY ANGIOGRAPHY N/A 04/09/2016   Procedure: Left Heart Cath and Coronary Angiography;  Surgeon: Nelva Bush, MD;  Location: [facility];  Service: Cardiovascular;  Laterality: N/A;  . MOUTH SURGERY      Current Medications: Outpatient Medications Prior to Visit  Medication Sig Dispense Refill  . acetaminophen-codeine (TYLENOL #3) 300-30 MG per tablet Take 1-2 tablets by mouth every 4 (four) hours as needed for  moderate pain. 30 tablet 0  . albuterol (PROVENTIL HFA;VENTOLIN HFA) 108 (90 Base) MCG/ACT inhaler Inhale 2 puffs into the lungs every 6 (six) hours as needed for wheezing or shortness of breath.    Marland Kitchen amLODipine (NORVASC) 5 MG tablet Take 2.5 mg by mouth daily. TAKES 0.5 TABLET    . aspirin 81 MG tablet Take 81 mg by mouth daily.    Marland Kitchen aspirin-acetaminophen-caffeine (EXCEDRIN MIGRAINE) 250-250-65 MG tablet Take 2 tablets by mouth every 6 (six) hours as needed for headache.    . benzonatate (TESSALON) 100  MG capsule Take 1 capsule (100 mg total) by mouth 3 (three) times daily as needed for cough. 20 capsule 0  . Bepotastine Besilate (BEPREVE) 1.5 % SOLN Place 1 drop into both eyes 2 (two) times daily as needed (DRY EYES).    . Cholecalciferol (VITAMIN D) 2000 units CAPS Take 4,000 Units by mouth daily.    . diclofenac (VOLTAREN) 75 MG EC tablet Take 75 mg by mouth 2 (two) times daily.    . Dietary Management Product (RHEUMATE) CAPS Take 1 capsule by mouth daily. [place]    . DIPROLENE 0.05 % ointment Apply 1 application topically as needed (SKIN IRRITATION).     . ferrous sulfate 325 (65 FE) MG tablet Take 325 mg by mouth daily with breakfast.    . fexofenadine (ALLEGRA) 180 MG tablet Take 180 mg by mouth daily.    . Flaxseed, Linseed, (FLAXSEED OIL) 1000 MG CAPS Take 1,000 mg by mouth daily.    . Fluocinolone Acetonide Scalp 0.01 % OIL Apply 1 application topically daily as needed for itching.    . hydroxychloroquine (PLAQUENIL) 200 MG tablet TAKE 2 TABLETS BY MOUTH ONCE A DAY WITH FOOD OR MILK  5  . losartan (COZAAR) 50 MG tablet Take 50 mg by mouth every evening.   1  . methocarbamol (ROBAXIN) 500 MG tablet TAKE 1 TABLET AT BEDTIME AS NEEDED FOR NECK AND SHOULDER PAIN ORALLY 30 DAY(S)  0  . methotrexate 50 MG/2ML injection INJECT 0.8 ML ONCE WEEKLY OR AS DIRECTED On SUNDAY  3  . metoprolol succinate (TOPROL-XL) 25 MG 24 hr tablet Take 25 mg by mouth daily.  1  . oxyCODONE-acetaminophen (PERCOCET/ROXICET) 5-325 MG per tablet Take 1 tablet by mouth every 6 (six) hours as needed for moderate pain or severe pain.     . promethazine-dextromethorphan (PROMETHAZINE-DM) 6.25-15 MG/5ML syrup Take 5 mLs by mouth 4 (four) times daily as needed for cough. 118 mL 0  . HYDROcodone-homatropine (HYCODAN) 5-1.5 MG/5ML syrup 53m by mouth a bedtime as needed for cough. (Patient taking differently: Take 5 mLs by mouth at bedtime as needed for cough. 34m by mouth a bedtime as needed for cough.) 120  mL 0   No facility-administered medications prior to visit.      Allergies:   Cinnamon; Peanut-containing drug products; and Corn-containing products   Social History   Social History  . Marital status: Married    Spouse name: N/A  . Number of children: N/A  . Years of education: N/A   Social History Main Topics  . Smoking status: Never Smoker  . Smokeless tobacco: Never Used  . Alcohol use No  . Drug use: No  . Sexual activity: Not Asked   Other Topics Concern  . 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   ROS:   Please see the history of present illness.    ROS All other systems reviewed and are negative.   PHYSICAL EXAM:   VS:  BP (!) 145/86   Pulse 70   Ht 5\' 7"  (1.702 m)   Wt 214 lb 12.8 oz (97.4 kg)   BMI 33.64 kg/m    GEN: Well nourished, well developed, in no acute distress  HEENT: normal  Neck: no JVD, carotid bruits, or masses Cardiac: RRR; no murmurs, rubs, or gallops,no edema  Respiratory:  clear to auscultation bilaterally, normal work of breathing GI: soft, nontender, nondistended, + BS MS: no deformity or atrophy +limited range of motion of wrist Skin: warm and dry, no rash +minimal swelling in the digits on the right compare to the left Neuro:  Alert and Oriented x 3, Strength and sensation are intact Psych: euthymic mood, full affect  Wt Readings from Last 3 Encounters:  04/12/16 214 lb 12.8 oz (97.4 kg)  04/09/16 211 lb (95.7 kg)  04/02/16 211 lb 1.9 oz (95.8 kg)      Studies/Labs Reviewed:   EKG:  EKG is not ordered today.    Recent Labs: 04/02/2016: BUN 10; Creatinine, Ser 0.75; Platelets 211; Potassium 4.2; Sodium  142   Lipid Panel No results found for: CHOL, TRIG, HDL, CHOLHDL, VLDL, LDLCALC, LDLDIRECT  Additional studies/ records that were reviewed today include:   Echo 02/25/2016 LV EF: 55% -   60%  - Left ventricle: The cavity size was normal. Systolic function was   normal. The estimated ejection fraction was in the range of 55%   to 60%. Wall motion was normal; there were no regional wall   motion abnormalities. Left ventricular diastolic function   parameters were normal. - Aortic valve: There was trivial regurgitation. - Atrial septum: No defect or patent foramen ovale was identified. - Pulmonary arteries: PA peak pressure: 32 mm Hg (S).    Myoview 03/22/2016 Study Highlights     Nuclear stress EF: 62%.  Blood pressure demonstrated a normal response to exercise.  ST segment depression of 2 mm was noted during stress in the II, III, aVF, V5 and V6 leads.  Findings consistent with ischemia.  The left ventricular ejection fraction is normal (55-65%).  This is an intermediate risk study.   Small area of apical and distal septal ischemia. ECG positive for ischemia EF normal 62%      Cath 04/09/2016 Conclusion   Conclusions: 1. No angiographically significant coronary artery disease. 2. Normal left ventricular systolic function and filling pressure.  Recommendations: 1. Primary prevention of coronary artery disease. 2. Abnormal stress test may be false positive or potentially related to small vessel disease. Continue with medical therapy.      ASSESSMENT:    1. Right wrist pain   2. Essential hypertension   3. Lupus erythematosus, unspecified form   4. Abnormal stress test      PLAN:  In order of problems listed above:  1. Right wrist pain: Unclear cause, differential diagnosis include soft tissue swelling versus lupus flare. Swelling of the right fingers is barely noticeable when compare to the left side. - Timing-wise, patient says right wrist pain  only occurred after the recent cath on 04/09/2016. She has  2+ pulses in the radial artery and antecubital space. Relatively low suspicion of significant vascular injury. - Moderate compression directly over the cath site did not induce significant pain, therefore low suspicion for hematoma or abscess formation. I even attempted to auscultate area, I did not hear any bruit or feel any thrill to suggest the formation of any arteriovenous fistula.  - There is no sign of compartment syndrome in the right forearm.  - R wrist and hand have limited range of motion and swelling in digits with numbness seen at the tip of second, third, and the fourth digit.  - I discussed with DOD Dr. Claiborne Billings who is our interventional cardiologist to see what other potential complication could arise during cardiac catheterization. We both agreed at the present, we should continue to monitor her symptom. She is on diclofenac (Voltaren) which is a NSAID, therefore we will not add any additional NSAID in its place. I recommended light activity with his fingers, however no excessive heavy lifting. Sje will be seen again in the next 2 weeks by Richardson Dopp PA-C. Although a potential fracture of the wrist bone may cause similar issues, however there is no excessive manipulation expected during cardiac catheterization. I discussed with Dr. Claiborne Billings regarding wrist arterial ultrasound and x-ray, our current suspicion is relatively low and we wish to see how the patient does first on conservative management in the next week. Hopefully if this is only soft tissue swelling, it should improve after a week. If still has difficulty moving on followup, then consider wrist arterial US and Xray.  2. Hypertension: Blood pressure mildly elevated, it has been elevated during the last few follow-up.  3. Lupus: She has been diagnosed with lupus for many years, however it is unusual to for her to have pain only in one joint. I am not entirely sure how much her  lupus can contribute to the wrist pain.  4. Abnormal stress test: falsely positive, normal coronaries on subsequent cardiac catheterization.    Medication Adjustments/Labs and Tests Ordered: Current medicines are reviewed at length with the patient today.  Concerns regarding medicines are outlined above.  Medication changes, Labs and Tests ordered today are listed in the Patient Instructions below. Patient Instructions  Medication Instructions:  Your physician recommends that you continue on your current medications as directed. Please refer to the Current Medication list given to you today.   Labwork: NONE ORDERED  Testing/Procedures: NONE ORDERED  Follow-Up: KEEP YOUR POST CATH FOLLOW UP AS PLANNED WITH [facility] ON 04/23/16 @ 9:45   Any Other Special Instructions Will Be Listed Below (If Applicable). 1. REST IS NEEDED TO HELP YOUR WRIST HEAL FROM THE CATH 2. YOU CAN USE THE STRESS BALL THOUGH ONLY SQUEEZE EXTREMELY GENTLY     If you need a refill on your cardiac medications before your next appointment, please call your pharmacy.      Hilbert Corrigan, [place]  04/12/2016 11:30 PM    [place], [place], [place]  [address] Phone: [phone]; Fax: [phone]

## 2016-04-12 NOTE — Telephone Encounter (Signed)
Pt states she got letter for herself and her husband when she went for cath 04/09/16.

## 2016-04-12 NOTE — Telephone Encounter (Signed)
Pt states her right hand and wrist are still swollen, no worse but no better, some soreness in wrist and hand, this is about the same also.  Pt states she did have some tingling in 3-4 fingers of her right hand this morning. Pt states she would feel better if cath site could be checked today.  Pt has been scheduled to see Almyra Deforest, PA at 3:30 PM today, pt is aware this is at the [facility].

## 2016-04-12 NOTE — Patient Instructions (Addendum)
Medication Instructions:  Your physician recommends that you continue on your current medications as directed. Please refer to the Current Medication list given to you today.   Labwork: NONE ORDERED  Testing/Procedures: NONE ORDERED  Follow-Up: KEEP YOUR POST CATH FOLLOW UP AS PLANNED WITH [facility] ON 04/23/16 @ 9:45   Any Other Special Instructions Will Be Listed Below (If Applicable). 1. REST IS NEEDED TO HELP YOUR WRIST HEAL FROM THE CATH 2. YOU CAN USE THE STRESS BALL THOUGH ONLY SQUEEZE EXTREMELY GENTLY     If you need a refill on your cardiac medications before your next appointment, please call your pharmacy.

## 2016-04-14 ENCOUNTER — Ambulatory Visit (INDEPENDENT_AMBULATORY_CARE_PROVIDER_SITE_OTHER): Payer: 59 | Admitting: Cardiovascular Disease

## 2016-04-14 ENCOUNTER — Encounter: Payer: Self-pay | Admitting: Cardiovascular Disease

## 2016-04-14 ENCOUNTER — Telehealth: Payer: Self-pay | Admitting: Physician Assistant

## 2016-04-14 ENCOUNTER — Encounter (INDEPENDENT_AMBULATORY_CARE_PROVIDER_SITE_OTHER): Payer: Self-pay

## 2016-04-14 ENCOUNTER — Encounter: Payer: Self-pay | Admitting: Nurse Practitioner

## 2016-04-14 VITALS — BP 140/84 | HR 76 | Ht 67.0 in | Wt 214.8 lb

## 2016-04-14 DIAGNOSIS — M25531 Pain in right wrist: Secondary | ICD-10-CM

## 2016-04-14 NOTE — Telephone Encounter (Signed)
Pt called because she is concern. Pt had a cardiac cath on 04/09/16. Pt was seen on Monday 04/12/16 by Eulas Post PA post cardiac. Pt states that on this  OV PT WAS NOT ABLE TO MAKE A FIST. She still not able to make a fist now, but  the swelling is down. Pt    states that she is keeping her arm up except when she walks about then she has her arm down the her fingers get puffy and tingling at the tip of her gingers. . Pt also states that starting last night she has been having pain going up to her elbow. Pt is scheduled to go to work tomorrow and she is not going to be able to do her work. Pt would like to be seen by a doctor ASAP.   Dr. Donata Duff ser DOD aware of pt's symptoms. He recommends to put pt on his scheduled to be seen today  At  2:30 PM or 3:30 PM today. Left pt a detail message to call back. Spoke with pt agreed to came to be seen at 3:30 PM this afternoon. Appointment was made for pt pt is aware.Marland Kitchen

## 2016-04-14 NOTE — Telephone Encounter (Signed)
Cynthia Frye is calling because she had a cath done on 04/09/16 and she is having some tingling from her hand up her arm , sharp pain and some swelling in her fingers as well. Please call

## 2016-04-14 NOTE — Progress Notes (Signed)
Cardiology Office Note   Date:  04/14/2016   ID:  SUMMERS BUENDIA, DOB 1960-06-23, MRN [id number]  PCP:  Henrine Screws, MD  Cardiologist:  End   Chief Complaint  Patient presents with  . Wrist Pain      History of Present Illness: Cynthia Frye is a 56 y.o. female who presents for Follow-up evaluation for  some wrist pain following a heart cath.   Patient was recently seen by Desiree Hane on March 19 for wrist evaluation.   She originally presented for chest pain and a treadmill Myoview study was intermediate probability with anteroapical ischemia. She had a heart catheterization on March 16 which showed no coronary artery disease. She developed some wrist pain following the heart catheterization.  She has developed some mild generalized right hand swelling .  Has some formed by tingling in her fingertips.  Unable to make a fist.  Has some tingling radiating up her arm . Shooting pains up to her elbow and up to her shoulder    Takes diclofenac for her Lupus , has continued to take this  She has a history of lupus. She occasional has wrist pain associated with a lupus flare.  Past Medical History:  Diagnosis Date  . Asthma    allergy shots and medication  . DDD (degenerative disc disease), lumbar   . Fibroids   . Heart murmur    Echo 1/18: EF 55-60, no RWMA, normal diastolic function, trivial AI, PASP 32  . History of nuclear stress test    ETT-Myoview 2/18: EF 62, + ECG response; apical and distal septal ischemia; intermediate risk.  Marland Kitchen Hypertension   . Iron deficiency anemia   . Lupus 2011   Dr. Trudie Reed  . Obesity   . Seasonal allergies   . Seizure in childhood [facility]    as a child no treatment none x 30 years    Past Surgical History:  Procedure Laterality Date  . ABDOMINAL HYSTERECTOMY    . BACK SURGERY  2009  . COLONOSCOPY WITH PROPOFOL N/A 04/11/2012   Procedure: COLONOSCOPY WITH PROPOFOL;  Surgeon: Garlan Fair, MD;  Location: [facility];   Service: Endoscopy;  Laterality: N/A;  . HERNIA REPAIR     as child unbilical  . LEFT HEART CATH AND CORONARY ANGIOGRAPHY N/A 04/09/2016   Procedure: Left Heart Cath and Coronary Angiography;  Surgeon: Nelva Bush, MD;  Location: [facility];  Service: Cardiovascular;  Laterality: N/A;  . MOUTH SURGERY       Current Outpatient Prescriptions  Medication Sig Dispense Refill  . albuterol (PROVENTIL HFA;VENTOLIN HFA) 108 (90 Base) MCG/ACT inhaler Inhale 2 puffs into the lungs every 6 (six) hours as needed for wheezing or shortness of breath.    Marland Kitchen amLODipine (NORVASC) 5 MG tablet Take 2.5 mg by mouth daily. TAKES 0.5 TABLET    . aspirin 81 MG tablet Take 81 mg by mouth daily.    Marland Kitchen aspirin-acetaminophen-caffeine (EXCEDRIN MIGRAINE) 250-250-65 MG tablet Take 2 tablets by mouth every 6 (six) hours as needed for headache.    . Bepotastine Besilate (BEPREVE) 1.5 % SOLN Place 1 drop into both eyes 2 (two) times daily as needed (DRY EYES).    . Cholecalciferol (VITAMIN D) 2000 units CAPS Take 4,000 Units by mouth daily.    . diclofenac (VOLTAREN) 75 MG EC tablet Take 75 mg by mouth 2 (two) times daily.    . Dietary Management Product (RHEUMATE) CAPS Take 1 capsule by mouth daily.  [place]    . DIPROLENE 0.05 % ointment Apply 1 application topically as needed (SKIN IRRITATION).     . ferrous sulfate 325 (65 FE) MG tablet Take 325 mg by mouth daily with breakfast.    . fexofenadine (ALLEGRA) 180 MG tablet Take 180 mg by mouth daily.    . Flaxseed, Linseed, (FLAXSEED OIL) 1000 MG CAPS Take 1,000 mg by mouth daily.    . Fluocinolone Acetonide Scalp 0.01 % OIL Apply 1 application topically daily as needed for itching.    . hydroxychloroquine (PLAQUENIL) 200 MG tablet TAKE 2 TABLETS BY MOUTH ONCE A DAY WITH FOOD OR MILK  5  . losartan (COZAAR) 50 MG tablet Take 50 mg by mouth every evening.   1  . methotrexate 50 MG/2ML injection INJECT 0.8 ML ONCE WEEKLY OR AS DIRECTED On  SUNDAY  3  . metoprolol succinate (TOPROL-XL) 25 MG 24 hr tablet Take 25 mg by mouth daily.  1  . oxyCODONE-acetaminophen (PERCOCET/ROXICET) 5-325 MG per tablet Take 1 tablet by mouth every 6 (six) hours as needed for moderate pain or severe pain.      No current facility-administered medications for this visit.     No flowsheet data found.    Allergies:   Cinnamon; Peanut-containing drug products; and Corn-containing products    Social History:  The patient  reports that she has never smoked. She has never used smokeless tobacco. She reports that she does not drink alcohol or use drugs.   Family History:  The patient's family history includes Asthma in her mother; Diabetes in her mother; Heart attack (age of onset: 55) in her father; Stroke in her maternal aunt.    ROS:  Please see the history of present illness.    Review of Systems: Constitutional:  denies fever, chills, diaphoresis, appetite change and fatigue.  HEENT: denies photophobia, eye pain, redness, hearing loss, ear pain, congestion, sore throat, rhinorrhea, sneezing, neck pain, neck stiffness and tinnitus.  Respiratory: denies SOB, DOE, cough, chest tightness, and wheezing.  Cardiovascular: denies chest pain, palpitations and leg swelling.  Gastrointestinal: denies nausea, vomiting, abdominal pain, diarrhea, constipation, blood in stool.  Genitourinary: denies dysuria, urgency, frequency, hematuria, flank pain and difficulty urinating.  Musculoskeletal: admits to  Significant right hand pain and tingling   Skin: denies pallor, rash and wound.  Neurological: denies dizziness, seizures, syncope, weakness, light-headedness, numbness and headaches.   Hematological: denies adenopathy, easy bruising, personal or family bleeding history.  Psychiatric/ Behavioral: denies suicidal ideation, mood changes, confusion, nervousness, sleep disturbance and agitation.       All other systems are reviewed and negative.     PHYSICAL EXAM: VS:  BP 140/84 (BP Location: Left Arm, Patient Position: Sitting, Cuff Size: Large)   Pulse 76   Ht 5\' 7"  (1.702 m)   Wt 214 lb 12.8 oz (97.4 kg)   SpO2 98%   BMI 33.64 kg/m  , BMI Body mass index is 33.64 kg/m. GEN: Well nourished, well developed, in no acute distress  HEENT: normal  Neck: no JVD, carotid bruits, or masses Cardiac: RRR; no murmurs, rubs, or gallops,no edema  Respiratory:  clear to auscultation bilaterally, normal work of breathing GI: soft, nontender, nondistended, + BS MS: right hand is minimally swollen.   Tender to the touch. Great radial pulse.   Pulse oxymetry shows circulation is good down to the fingers. No bruising  Skin: warm and dry, no rash Neuro:  Strength and sensation are intact  Psych: normal   EKG:  EKG is not ordered today.     Recent Labs: 04/02/2016: BUN 10; Creatinine, Ser 0.75; Platelets 211; Potassium 4.2; Sodium 142    Lipid Panel No results found for: CHOL, TRIG, HDL, CHOLHDL, VLDL, LDLCALC, LDLDIRECT    Wt Readings from Last 3 Encounters:  04/14/16 214 lb 12.8 oz (97.4 kg)  04/12/16 214 lb 12.8 oz (97.4 kg)  04/09/16 211 lb (95.7 kg)      Other studies Reviewed: Additional studies/ records that were reviewed today include: . Review of the above records demonstrates:    ASSESSMENT AND PLAN:  1.  Right wrist pain: Patient presents today for follow-up evaluation of right wrist pain that occurred after her cardiac cath. She has been seen and evaluated 2 days ago by Almyra Deforest, PA  Her circulation remains intact. She does not have any significant hematoma. There is no bruit. It's possible that she has some nerve irritation. At this point I think that we should refer her to one of the hand surgeons for further evaluation. I have recommended that she continue to take diclofenac for inflammation.   Keep her hand elevated.   She should not use her right hand much until her symptoms resolve   We can write her a work  excuse and excuse her from work until March 30 when she has a follow up with Richardson Dopp, PA.    Current medicines are reviewed at length with the patient today.  The patient does not have concerns regarding medicines.  Labs/ tests ordered today include:  No orders of the defined types were placed in this encounter.    Disposition:   FU with Richardson Dopp, PA      Mertie Moores, MD  04/14/2016 3:54 PM    [facility] [address], [place], [place]  [address] Phone: [phone]; Fax: [phone]

## 2016-04-14 NOTE — Patient Instructions (Signed)
Medication Instructions:  Your physician recommends that you continue on your current medications as directed. Please refer to the Current Medication list given to you today.   Labwork: None Ordered   Testing/Procedures: None Ordered   Follow-Up: Keep your follow-up appointment with Richardson Dopp, PA-C on 3/30  You have been referred to an Orthopedic Doctor for evaluation of wrist pain   If you need a refill on your cardiac medications before your next appointment, please call your pharmacy.   Thank you for choosing [facility]! Christen Bame, RN [phone]

## 2016-04-20 ENCOUNTER — Other Ambulatory Visit: Payer: Self-pay | Admitting: Obstetrics and Gynecology

## 2016-04-20 DIAGNOSIS — N6489 Other specified disorders of breast: Secondary | ICD-10-CM

## 2016-04-22 ENCOUNTER — Ambulatory Visit
Admission: RE | Admit: 2016-04-22 | Discharge: 2016-04-22 | Disposition: A | Discharge status: Home / Self Care | Payer: 59 | Source: Ambulatory Visit | Attending: Obstetrics and Gynecology | Admitting: Obstetrics and Gynecology

## 2016-04-22 DIAGNOSIS — N6489 Other specified disorders of breast: Secondary | ICD-10-CM

## 2016-04-22 DIAGNOSIS — R928 Other abnormal and inconclusive findings on diagnostic imaging of breast: Secondary | ICD-10-CM | POA: Diagnosis not present

## 2016-04-23 ENCOUNTER — Encounter: Payer: Self-pay | Admitting: Physician Assistant

## 2016-04-23 ENCOUNTER — Ambulatory Visit (INDEPENDENT_AMBULATORY_CARE_PROVIDER_SITE_OTHER): Payer: BLUE CROSS/BLUE SHIELD | Admitting: Physician Assistant

## 2016-04-23 VITALS — BP 138/82 | HR 80 | Ht 67.0 in | Wt 213.4 lb

## 2016-04-23 DIAGNOSIS — R9439 Abnormal result of other cardiovascular function study: Secondary | ICD-10-CM | POA: Diagnosis not present

## 2016-04-23 DIAGNOSIS — I10 Essential (primary) hypertension: Secondary | ICD-10-CM | POA: Diagnosis not present

## 2016-04-23 NOTE — Progress Notes (Signed)
Cardiology Office Note:    Date:  04/23/2016   ID:  Cynthia Frye, DOB [birth year], MRN [id number]  PCP:  Henrine Screws, MD  Cardiologist:  Dr. Harrell Gave End   Electrophysiologist:  n/a  Referring MD: Josetta Huddle, MD   Chief Complaint  Patient presents with  . Follow-up    s/p cardiac cath    History of Present Illness:    Cynthia Frye is a 56 y.o. female with a hx of HTN, Lupus, obesity, asthma.  She was sent to our office in 2/18 for a routine treadmill test to evaluate left arm pain because of a strong family history of CAD. This was markedly abnormal and we arranged a exercise perfusion study. This was intermediate risk and suggested apical and distal septal ischemia. She was therefore set up for cardiac catheterization. Cardiac catheterization demonstrated normal coronary arteries. Procedure was done via right radial artery. Patient has developed significant right wrist pain since her cardiac catheterization. She was seen in the office 04/12/16 by Almyra Deforest, PA-C and again on 04/14/16 by Dr. Liam Rogers.  She was referred to ortho and saw Dr. Fredna Dow.  He referred her to OT.  She returns for follow up.  She is here alone.  Since last seen, she denies chest pain, shortness of breath, syncope, orthopnea, PND or significant pedal edema. Her R wrist pain is improved.   Prior CV studies:   The following studies were reviewed today:  LHC 04/09/16 1. No angiographically significant coronary artery disease. 2. Normal left ventricular systolic function and filling pressure.  ETT-Myoview 2/18:  EF 62, + ECG response; apical and distal septal ischemia; intermediate risk.   ETT 03/16/16 Baseline ECG with NSR, HR 84, normal axis, no ST-T wave changes With exercise, there was early development of 2-3 mm of downsloping inferolateral ST depression (early stage II). Test was stopped due to EKG changes. The patient did not have any arm symptoms or chest symptoms. Blood pressure response  to exercise was equivocal. Heart rate did recover rapidly in recovery with eventual resolution of ST changes by 3 minutes.   Echo 02/25/16 EF 55-60, no RWMA, normal diastolic function, trivial AI, PASP 32   Past Medical History:  Diagnosis Date  . Asthma    allergy shots and medication  . DDD (degenerative disc disease), lumbar   . Fibroids   . Heart murmur    Echo 1/18: EF 55-60, no RWMA, normal diastolic function, trivial AI, PASP 32  . History of cardiac catheterization    LHC 3/18: normal coronary arteries.   . History of nuclear stress test    ETT-Myoview 2/18: EF 62, + ECG response; apical and distal septal ischemia; intermediate risk.  Marland Kitchen Hypertension   . Iron deficiency anemia   . Lupus 2011   Dr. Trudie Reed  . Obesity   . Seasonal allergies   . Seizure in childhood [facility])    as a child no treatment none x 30 years    Past Surgical History:  Procedure Laterality Date  . ABDOMINAL HYSTERECTOMY    . BACK SURGERY  2009  . COLONOSCOPY WITH PROPOFOL N/A 04/11/2012   Procedure: COLONOSCOPY WITH PROPOFOL;  Surgeon: Garlan Fair, MD;  Location: [facility];  Service: Endoscopy;  Laterality: N/A;  . HERNIA REPAIR     as child unbilical  . LEFT HEART CATH AND CORONARY ANGIOGRAPHY N/A 04/09/2016   Procedure: Left Heart Cath and Coronary Angiography;  Surgeon: Nelva Bush, MD;  Location: [facility] INVASIVE CV  LAB;  Service: Cardiovascular;  Laterality: N/A;  . MOUTH SURGERY      Current Medications: Current Meds  Medication Sig  . albuterol (PROVENTIL HFA;VENTOLIN HFA) 108 (90 Base) MCG/ACT inhaler Inhale 2 puffs into the lungs every 6 (six) hours as needed for wheezing or shortness of breath.  Marland Kitchen amLODipine (NORVASC) 5 MG tablet Take 2.5 mg by mouth daily. TAKES 0.5 TABLET  . aspirin 81 MG tablet Take 81 mg by mouth daily.  Marland Kitchen aspirin-acetaminophen-caffeine (EXCEDRIN MIGRAINE) 250-250-65 MG tablet Take 2 tablets by mouth every 6 (six) hours as needed for headache.  . Bepotastine  Besilate (BEPREVE) 1.5 % SOLN Place 1 drop into both eyes 2 (two) times daily as needed (DRY EYES).  . Cholecalciferol (VITAMIN D) 2000 units CAPS Take 4,000 Units by mouth daily.  . diclofenac (VOLTAREN) 75 MG EC tablet Take 75 mg by mouth 2 (two) times daily.  . Dietary Management Product (RHEUMATE) CAPS Take 1 capsule by mouth daily. [place]  . DIPROLENE 0.05 % ointment Apply 1 application topically as needed (SKIN IRRITATION).   . ferrous sulfate 325 (65 FE) MG tablet Take 325 mg by mouth daily with breakfast.  . fexofenadine (ALLEGRA) 180 MG tablet Take 180 mg by mouth daily.  . Flaxseed, Linseed, (FLAXSEED OIL) 1000 MG CAPS Take 1,000 mg by mouth daily.  . Fluocinolone Acetonide Scalp 0.01 % OIL Apply 1 application topically daily as needed for itching.  . hydroxychloroquine (PLAQUENIL) 200 MG tablet TAKE 2 TABLETS BY MOUTH ONCE A DAY WITH FOOD OR MILK  . losartan (COZAAR) 50 MG tablet Take 50 mg by mouth every evening.   . methotrexate 50 MG/2ML injection INJECT 0.8 ML ONCE WEEKLY OR AS DIRECTED On SUNDAY  . metoprolol succinate (TOPROL-XL) 25 MG 24 hr tablet Take 25 mg by mouth daily.  Marland Kitchen oxyCODONE-acetaminophen (PERCOCET/ROXICET) 5-325 MG per tablet Take 1 tablet by mouth every 6 (six) hours as needed for moderate pain or severe pain.      Allergies:   Cinnamon; Other; Peanut-containing drug products; Corn oil; and Corn-containing products   Social History   Social History  . Marital status: Married    Spouse name: N/A  . Number of children: N/A  . Years of education: N/A   Social History Main Topics  . Smoking status: Never Smoker  . Smokeless tobacco: Never Used  . Alcohol use No  . Drug use: No  . Sexual activity: Not Asked   Other Topics Concern  . 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 Asthma Mother   . Diabetes Mother   . Heart attack Father 87  . Stroke Maternal Aunt      ROS:   Please see the history of present illness.    ROS All other systems reviewed and are negative.   EKGs/Labs/Other Test Reviewed:    EKG:  EKG is  ordered today.  The ekg ordered today demonstrates NSR, HR 80, normal axis, QTc 417 ms (no charge)   Recent Labs: 04/02/2016: BUN 10; Creatinine, Ser 0.75; Platelets 211; Potassium 4.2; Sodium 142   Recent Lipid Panel No results found for: CHOL, TRIG, HDL, CHOLHDL, VLDL, LDLCALC, LDLDIRECT   Physical Exam:    VS:  BP 138/82   Pulse 80   Ht 5\' 7"  (1.702 m)   Wt 213 lb 6.4 oz (96.8 kg)   BMI 33.42 kg/m     Wt Readings from Last 3 Encounters:  04/23/16 213 lb 6.4 oz (96.8 kg)  04/14/16 214 lb 12.8 oz (97.4 kg)  04/12/16 214 lb 12.8 oz (97.4 kg)     Physical Exam  Constitutional: She is oriented to person, place, and time. She appears well-developed and well-nourished. No distress.  HENT:  Head: Normocephalic and atraumatic.  Eyes: No scleral icterus.  Neck: Normal range of motion. No JVD (at 90 degrees) present.  Cardiovascular: Normal rate, regular rhythm, S1 normal, S2 normal and normal heart sounds.   No murmur heard. Pulmonary/Chest: Breath sounds normal. She has no wheezes. She has no rhonchi. She has no rales.  Abdominal: Soft.  Musculoskeletal: She exhibits no edema or deformity (R wrist with good ROM; Radial pulse intact).  Neurological: She is alert and oriented to person, place, and time.  Skin: Skin is warm and dry.  Psychiatric: She has a normal mood and affect.    ASSESSMENT:    1. Abnormal stress test   2. Essential hypertension    PLAN:    In order of problems listed above:  1. Abnormal stress test -  She had atypical symptoms and strong FHx prompting routine exercise test that was markedly abnormal which essentially led to a cardiac cath an intermediate risk  Myoview.  Her cardiac catheterization was normal.  Therefore, her stress test was falsely abnormal.  No further cardiac testing is needed and she can FU as needed.  Her R wrist was painful post cath and she has seen an orthopedist. Her pain is improved and her exam is normal.    2. Essential hypertension -  BP is controlled.  She notes palpitations since starting Toprol.  She will discuss stopping this with her PCP.  If symptoms continue after stopping beta-blocker or her palpitations worsen, she will call so we can arrange an event monitor.    Dispo:  Return if symptoms worsen or fail to improve, for w/ Richardson Dopp, PA-C or Dr. Saunders Revel.   Medication Adjustments/Labs and Tests Ordered: Current medicines are reviewed at length with the patient today.  Concerns regarding medicines are outlined above.  Medication changes, Labs and Tests ordered today are outlined in the Patient Instructions noted below. Patient Instructions  Medication Instructions:  No changes  Labwork: None   Testing/Procedures: None   Follow-Up: As needed with Richardson Dopp, PA-C or Dr. Harrell Gave End   Any Other Special Instructions Will Be Listed Below (If Applicable).  If you need a refill on your cardiac medications before your next appointment, please call your pharmacy.   Signed, Richardson Dopp, PA-C  04/23/2016 10:27 AM    [facility] [address], [place], [place]  [address] Phone: [phone]; Fax: [phone]

## 2016-04-23 NOTE — Patient Instructions (Signed)
Medication Instructions:  No changes  Labwork: None   Testing/Procedures: None   Follow-Up: As needed with Richardson Dopp, PA-C or Dr. Harrell Gave End   Any Other Special Instructions Will Be Listed Below (If Applicable).  If you need a refill on your cardiac medications before your next appointment, please call your pharmacy.

## 2016-04-29 ENCOUNTER — Ambulatory Visit (INDEPENDENT_AMBULATORY_CARE_PROVIDER_SITE_OTHER): Payer: 59 | Admitting: Physician Assistant

## 2016-04-29 ENCOUNTER — Encounter: Payer: Self-pay | Admitting: Physician Assistant

## 2016-04-29 VITALS — BP 128/84 | HR 90 | Temp 98.9°F | Resp 16 | Wt 212.0 lb

## 2016-04-29 DIAGNOSIS — J4520 Mild intermittent asthma, uncomplicated: Secondary | ICD-10-CM

## 2016-04-29 DIAGNOSIS — J302 Other seasonal allergic rhinitis: Secondary | ICD-10-CM | POA: Insufficient documentation

## 2016-04-29 DIAGNOSIS — J45909 Unspecified asthma, uncomplicated: Secondary | ICD-10-CM | POA: Insufficient documentation

## 2016-04-29 DIAGNOSIS — M329 Systemic lupus erythematosus, unspecified: Secondary | ICD-10-CM | POA: Insufficient documentation

## 2016-04-29 DIAGNOSIS — I10 Essential (primary) hypertension: Secondary | ICD-10-CM | POA: Insufficient documentation

## 2016-04-29 MED ORDER — FLUTICASONE PROPIONATE 50 MCG/ACT NA SUSP: 2.0000 | Freq: Every day | NASAL | 12 refills | Status: DC

## 2016-04-29 MED ORDER — ALBUTEROL SULFATE HFA 108 (90 BASE) MCG/ACT IN AERS: 2.0000 | INHALATION_SPRAY | Freq: Four times a day (QID) | RESPIRATORY_TRACT | 0 refills | Status: DC | PRN

## 2016-04-29 MED ORDER — MAGIC MOUTHWASH W/LIDOCAINE: 10.0000 mL | ORAL | 0 refills | Status: DC | PRN

## 2016-04-29 MED ORDER — BENZONATATE 100 MG PO CAPS: 100.0000 mg | ORAL_CAPSULE | Freq: Three times a day (TID) | ORAL | 0 refills | Status: DC | PRN

## 2016-04-29 NOTE — Patient Instructions (Addendum)
Start taking the Flonase (2 sprays in each nostril daily and may cut down to 1 spray) and continue the Allegra daily. You make also use the Tessalon pearls for cough and Magic Mouthwash as needed for your symptoms. We sent your albuterol inhaler for a refill. If your symptoms do not improve in a week or so go back to your PCP or return to our clinic for reevaluation.    IF you received an x-ray today, you will receive an invoice from [facility]. Please contact [facility] at [phone] with questions or concerns regarding your invoice.   IF you received labwork today, you will receive an invoice from [facility]. Please contact [facility] at [phone] with questions or concerns regarding your invoice.   Our billing staff will not be able to assist you with questions regarding bills from these companies.  You will be contacted with the lab results as soon as they are available. The fastest way to get your results is to activate your My Chart account. Instructions are located on the last page of this paperwork. If you have not heard from [place] regarding the results in 2 weeks, please contact this office.

## 2016-04-29 NOTE — Assessment & Plan Note (Signed)
Inadequate control with oral antihistamine. Continue fexofenadine. Add steroid nasal spray. If no improvement, would consider montelukast.

## 2016-04-29 NOTE — Assessment & Plan Note (Signed)
Currently controlled, but may become exacerbated by recent increase in allergy symptoms. Continue PRN albuterol. May need increased management in the future.

## 2016-04-29 NOTE — Progress Notes (Signed)
Patient ID: Cynthia Frye, female    DOB: [birth year], 56 y.o.   MRN: [id number]  PCP: Henrine Screws, MD  Chief Complaint  Patient presents with  . URI    sore throat, cough and headache x 2 days    Subjective:   Presents for evaluation of 2 days of sore throat, non-productive cough and headache.  Seasonal allergies treated with Allegra, which doesn't seem to be as effective this season as usual.  Itchy eyes, runny nose, sneezing and post-nasal drip. Some sinus pressure.  No fever, chills, nausea, vomiting or diarrhea. No body aches. No CP, SOB, dizziness.  She recently completed a steroid dose pack. MMW has helped previously with similar symptoms. In the past she has also needed albuterol for symptom management.     Review of Systems As above.    Patient Active Problem List   Diagnosis Date Noted  . Essential hypertension 04/29/2016  . Lupus (systemic lupus erythematosus) ([place]) 04/29/2016  . Asthma 04/29/2016  . Seasonal allergies 04/29/2016  . Abnormal stress test 04/09/2016     Prior to Admission medications   Medication Sig Start Date End Date Taking? Authorizing Provider  amLODipine (NORVASC) 5 MG tablet Take 2.5 mg by mouth daily. TAKES 0.5 TABLET   Yes Historical Provider, MD  aspirin 81 MG tablet Take 81 mg by mouth daily.   Yes Historical Provider, MD  aspirin-acetaminophen-caffeine (EXCEDRIN MIGRAINE) [phone] MG tablet Take 2 tablets by mouth every 6 (six) hours as needed for headache.   Yes Historical Provider, MD  Bepotastine Besilate (BEPREVE) 1.5 % SOLN Place 1 drop into both eyes 2 (two) times daily as needed (DRY EYES).   Yes Historical Provider, MD  Cholecalciferol (VITAMIN D) 2000 units CAPS Take 4,000 Units by mouth daily.   Yes Historical Provider, MD  diclofenac (VOLTAREN) 75 MG EC tablet Take 75 mg by mouth 2 (two) times daily.   Yes Historical Provider, MD  Dietary Management Product (RHEUMATE) CAPS Take 1 capsule by mouth  daily. [place]   Yes Historical Provider, MD  DIPROLENE 0.05 % ointment Apply 1 application topically as needed (SKIN IRRITATION).  04/08/15  Yes Historical Provider, MD  ferrous sulfate 325 (65 FE) MG tablet Take 325 mg by mouth daily with breakfast.   Yes Historical Provider, MD  fexofenadine (ALLEGRA) 180 MG tablet Take 180 mg by mouth daily.   Yes Historical Provider, MD  Flaxseed, Linseed, (FLAXSEED OIL) 1000 MG CAPS Take 1,000 mg by mouth daily.   Yes Historical Provider, MD  Fluocinolone Acetonide Scalp 0.01 % OIL Apply 1 application topically daily as needed for itching. 01/13/16  Yes Historical Provider, MD  hydroxychloroquine (PLAQUENIL) 200 MG tablet TAKE 2 TABLETS BY MOUTH ONCE A DAY WITH FOOD OR MILK 06/17/14  Yes Historical Provider, MD  losartan (COZAAR) 50 MG tablet Take 50 mg by mouth every evening.  02/20/16  Yes Historical Provider, MD  methotrexate 50 MG/2ML injection INJECT 0.8 ML ONCE WEEKLY OR AS DIRECTED On SUNDAY 06/17/14  Yes Historical Provider, MD  metoprolol succinate (TOPROL-XL) 25 MG 24 hr tablet Take 25 mg by mouth daily. 02/20/16  Yes Historical Provider, MD  oxyCODONE-acetaminophen (PERCOCET/ROXICET) 5-325 MG per tablet Take 1 tablet by mouth every 6 (six) hours as needed for moderate pain or severe pain.    Yes Historical Provider, MD     Allergies  Allergen Reactions  . Cinnamon Other (See Comments)    Other reaction(s): Other (See  Comments) Unknown  On allergy test Unknown  On allergy test  . Other Hives  . Peanut-Containing Drug Products Hives  . Corn Oil Hives  . Corn-Containing Products Hives       Objective:  Physical Exam  Constitutional: She is oriented to person, place, and time. She appears well-developed and well-nourished. No distress.  BP 128/84 (Cuff Size: Normal)   Pulse 90   Temp 98.9 F (37.2 C) (Oral)   Resp 16   Wt 212 lb (96.2 kg)   SpO2 100%   BMI 33.20 kg/m    HENT:  Head: Normocephalic and atraumatic.   Right Ear: Hearing, tympanic membrane, external ear and ear canal normal.  Left Ear: Hearing, tympanic membrane, external ear and ear canal normal.  Nose: Mucosal edema and rhinorrhea present.  No foreign bodies. Right sinus exhibits no maxillary sinus tenderness and no frontal sinus tenderness. Left sinus exhibits no maxillary sinus tenderness and no frontal sinus tenderness.  Mouth/Throat: Uvula is midline, oropharynx is clear and moist and mucous membranes are normal. No uvula swelling. No oropharyngeal exudate.  Eyes: Conjunctivae and EOM are normal. Pupils are equal, round, and reactive to light. Right eye exhibits no discharge. Left eye exhibits no discharge. No scleral icterus.  Neck: Trachea normal, normal range of motion and full passive range of motion without pain. Neck supple. No thyroid mass and no thyromegaly present.  Cardiovascular: Normal rate, regular rhythm and normal heart sounds.   Pulmonary/Chest: Effort normal and breath sounds normal.  Lymphadenopathy:       Head (right side): No submandibular, no tonsillar, no preauricular, no posterior auricular and no occipital adenopathy present.       Head (left side): No submandibular, no tonsillar, no preauricular and no occipital adenopathy present.    She has no cervical adenopathy.       Right: No supraclavicular adenopathy present.       Left: No supraclavicular adenopathy present.  Neurological: She is alert and oriented to person, place, and time. She has normal strength. No cranial nerve deficit or sensory deficit.  Skin: Skin is warm, dry and intact. No rash noted.  Psychiatric: She has a normal mood and affect. Her speech is normal and behavior is normal.           Assessment & Plan:   Problem List Items Addressed This Visit    Asthma - Primary    Currently controlled, but may become exacerbated by recent increase in allergy symptoms. Continue PRN albuterol. May need increased management in the future.       Relevant Medications   albuterol (PROVENTIL HFA;VENTOLIN HFA) 108 (90 Base) MCG/ACT inhaler   Seasonal allergies    Inadequate control with oral antihistamine. Continue fexofenadine. Add steroid nasal spray. If no improvement, would consider montelukast.      Relevant Medications   magic mouthwash w/lidocaine SOLN   fluticasone (FLONASE) 50 MCG/ACT nasal spray   benzonatate (TESSALON) 100 MG capsule       Return if symptoms worsen or fail to improve.   Fara Chute, PA-C [facility] [place]

## 2016-04-29 NOTE — Progress Notes (Signed)
THIS NOTE IS USED FOR EDUCATIONAL PURPOSES ONLY!!!   Name: Cynthia Frye  DOB: Feb 11, 1960  Age: 56 y.o. Sex: female  CC:  Chief Complaint  Patient presents with  . URI    sore throat, cough and headache x 2 days    PCP: GATES,ROBERT NEVILL, MD  HPI: Patient reports today with complaints of sore throat and cough x2 days and HA x 3 days. Patient reports a PMH of seasonal allergies and feels like her allergies are flaring up. Patient reports her cough is dry. She reports associated itching eyes, rhinnorrhea, sneezing, posterior nasal drainage, and sinus pressure. Patient reports she takes allegra yearly. She denies fevers, body aches, CP, SOB. Patient does reports some chills yesterday. Patient reports she recently finished a prednisone taper pack recently.   Patient reports that she has received magic mouthwash before and feels like that works well. She is also asking for a refill of her albuterol. She feels like when her allergies are at its worst she could use her emergency inhaler.    ROS:  Constitutional: Negative for activity change, appetite change, fatigue and unexpected weight change.  HENT: Negative for congestion, dental problem, ear pain, hearing loss, mouth sores, sneezing, tinnitus and trouble swallowing.   Eyes: Negative for photophobia, pain, redness and visual disturbance.  Respiratory: Negative for chest tightness and shortness of breath.   Cardiovascular: Negative for chest pain, palpitations and leg swelling.  Gastrointestinal: Negative for abdominal pain, blood in stool, constipation, diarrhea, nausea and vomiting.  Genitourinary: Negative for dysuria, frequency, hematuria and urgency.  Musculoskeletal: Negative for arthralgias, gait problem, myalgias and neck stiffness.  Skin: Negative for rash.  Neurological: Negative for dizziness, speech difficulty, weakness, light-headedness, numbness and headaches.  Hematological: Negative for adenopathy.  Psychiatric/Behavioral:  Negative for confusion and sleep disturbance. The patient is not nervous/anxious.    PMH:  Patient Active Problem List   Diagnosis Date Noted  . Right wrist pain 04/14/2016  . Left arm pain 04/09/2016  . Abnormal stress test 04/09/2016    Allergies:  Allergies  Allergen Reactions  . Cinnamon Other (See Comments)    Other reaction(s): Other (See Comments) Unknown  On allergy test Unknown  On allergy test  . Other Hives  . Peanut-Containing Drug Products Hives  . Corn Oil Hives  . Corn-Containing Products Hives    Medications:  Current Outpatient Prescriptions on File Prior to Visit  Medication Sig Dispense Refill  . albuterol (PROVENTIL HFA;VENTOLIN HFA) 108 (90 Base) MCG/ACT inhaler Inhale 2 puffs into the lungs every 6 (six) hours as needed for wheezing or shortness of breath.    Marland Kitchen amLODipine (NORVASC) 5 MG tablet Take 2.5 mg by mouth daily. TAKES 0.5 TABLET    . aspirin 81 MG tablet Take 81 mg by mouth daily.    Marland Kitchen aspirin-acetaminophen-caffeine (EXCEDRIN MIGRAINE) 250-250-65 MG tablet Take 2 tablets by mouth every 6 (six) hours as needed for headache.    . Bepotastine Besilate (BEPREVE) 1.5 % SOLN Place 1 drop into both eyes 2 (two) times daily as needed (DRY EYES).    . Cholecalciferol (VITAMIN D) 2000 units CAPS Take 4,000 Units by mouth daily.    . diclofenac (VOLTAREN) 75 MG EC tablet Take 75 mg by mouth 2 (two) times daily.    . Dietary Management Product (RHEUMATE) CAPS Take 1 capsule by mouth daily. [place]    . DIPROLENE 0.05 % ointment Apply 1 application topically as needed (SKIN IRRITATION).     Marland Kitchen  ferrous sulfate 325 (65 FE) MG tablet Take 325 mg by mouth daily with breakfast.    . fexofenadine (ALLEGRA) 180 MG tablet Take 180 mg by mouth daily.    . Flaxseed, Linseed, (FLAXSEED OIL) 1000 MG CAPS Take 1,000 mg by mouth daily.    . Fluocinolone Acetonide Scalp 0.01 % OIL Apply 1 application topically daily as needed for itching.    .  hydroxychloroquine (PLAQUENIL) 200 MG tablet TAKE 2 TABLETS BY MOUTH ONCE A DAY WITH FOOD OR MILK  5  . losartan (COZAAR) 50 MG tablet Take 50 mg by mouth every evening.   1  . methotrexate 50 MG/2ML injection INJECT 0.8 ML ONCE WEEKLY OR AS DIRECTED On SUNDAY  3  . metoprolol succinate (TOPROL-XL) 25 MG 24 hr tablet Take 25 mg by mouth daily.  1  . oxyCODONE-acetaminophen (PERCOCET/ROXICET) 5-325 MG per tablet Take 1 tablet by mouth every 6 (six) hours as needed for moderate pain or severe pain.      No current facility-administered medications on file prior to visit.     PE:  GS: WDWN female sitting on exam table in NAD.  Vitals: BP 128/84 (Cuff Size: Normal)   Pulse 90   Temp 98.9 F (37.2 C) (Oral)   Resp 16   Wt 212 lb (96.2 kg)   SpO2 100%   BMI 33.20 kg/m  HEENT: Normocephalic, atruamatic. PEARRL. No cervical lymphadenopathy. No thyroid nodules, normal size, and equal bilaterally. Ears: Bilateral ears without erythema, TM clear with good coen of light. TM without bulging. Nose: Patent, without erythema or discharge. Mouth/Throat: Moist mucous membranes. No erythema. Tonsils without erythema or tonsillar exudate.  Cardiovascular: RRR. No S3 or S4. No murmurs, rubs, or gallops. Pulses 2+ and equal bilateral in the upper and lower extremities. Pulm: CTA bilaterally. No expiratory muscle use while breathing.  GI: +BS. NTND. No rigidity or guarding. No rebound tenderness.  Neuro: CN 2-12 grossly intact.  Psych: A&O x 4. Mood and affect appropriate for situation.  Skin: Warm and dry. No rashes or excoriations on exposed skin.   A&P:  1.Seasonal allergic rhinitis, unspecified chronicity, unspecified trigger - uncontrolled. If symptoms worsen consider Singulair.  Plan:  Pharm: magic mouthwash w/lidocaine SOLN, fluticasone (FLONASE) 50 MCG/ACT nasal spray, benzonatate (TESSALON) 100 MG capsule  2. Mild intermittent asthma without complication - controlled with intermittent  exacerbations with seasonal allergy season Plan: albuterol (PROVENTIL HFA;VENTOLIN HFA) 108 (90 Base) MCG/ACT inhaler       Respectfully,  Delilah Shan, PA-S2

## 2016-04-30 DIAGNOSIS — J9801 Acute bronchospasm: Secondary | ICD-10-CM | POA: Diagnosis not present

## 2016-04-30 DIAGNOSIS — J309 Allergic rhinitis, unspecified: Secondary | ICD-10-CM | POA: Diagnosis not present

## 2016-04-30 DIAGNOSIS — J029 Acute pharyngitis, unspecified: Secondary | ICD-10-CM | POA: Diagnosis not present

## 2016-04-30 DIAGNOSIS — J060 Acute laryngopharyngitis: Secondary | ICD-10-CM | POA: Diagnosis not present

## 2016-05-02 DIAGNOSIS — B309 Viral conjunctivitis, unspecified: Secondary | ICD-10-CM | POA: Diagnosis not present

## 2016-05-02 DIAGNOSIS — J069 Acute upper respiratory infection, unspecified: Secondary | ICD-10-CM | POA: Diagnosis not present

## 2016-05-04 DIAGNOSIS — J309 Allergic rhinitis, unspecified: Secondary | ICD-10-CM | POA: Diagnosis not present

## 2016-05-04 DIAGNOSIS — J310 Chronic rhinitis: Secondary | ICD-10-CM | POA: Diagnosis not present

## 2016-05-04 DIAGNOSIS — K219 Gastro-esophageal reflux disease without esophagitis: Secondary | ICD-10-CM | POA: Diagnosis not present

## 2016-05-25 DIAGNOSIS — M2550 Pain in unspecified joint: Secondary | ICD-10-CM | POA: Diagnosis not present

## 2016-05-25 DIAGNOSIS — M329 Systemic lupus erythematosus, unspecified: Secondary | ICD-10-CM | POA: Diagnosis not present

## 2016-05-25 DIAGNOSIS — M0600 Rheumatoid arthritis without rheumatoid factor, unspecified site: Secondary | ICD-10-CM | POA: Diagnosis not present

## 2016-05-25 DIAGNOSIS — Z79899 Other long term (current) drug therapy: Secondary | ICD-10-CM | POA: Diagnosis not present

## 2016-08-31 DIAGNOSIS — M0600 Rheumatoid arthritis without rheumatoid factor, unspecified site: Secondary | ICD-10-CM | POA: Diagnosis not present

## 2016-08-31 DIAGNOSIS — M329 Systemic lupus erythematosus, unspecified: Secondary | ICD-10-CM | POA: Diagnosis not present

## 2016-08-31 DIAGNOSIS — Z79899 Other long term (current) drug therapy: Secondary | ICD-10-CM | POA: Diagnosis not present

## 2016-08-31 DIAGNOSIS — M2550 Pain in unspecified joint: Secondary | ICD-10-CM | POA: Diagnosis not present

## 2016-12-06 DIAGNOSIS — M0600 Rheumatoid arthritis without rheumatoid factor, unspecified site: Secondary | ICD-10-CM | POA: Diagnosis not present

## 2016-12-06 DIAGNOSIS — M329 Systemic lupus erythematosus, unspecified: Secondary | ICD-10-CM | POA: Diagnosis not present

## 2016-12-06 DIAGNOSIS — Z79899 Other long term (current) drug therapy: Secondary | ICD-10-CM | POA: Diagnosis not present

## 2016-12-06 DIAGNOSIS — M2550 Pain in unspecified joint: Secondary | ICD-10-CM | POA: Diagnosis not present

## 2016-12-14 ENCOUNTER — Other Ambulatory Visit: Payer: Self-pay | Admitting: Obstetrics and Gynecology

## 2016-12-14 DIAGNOSIS — Z139 Encounter for screening, unspecified: Secondary | ICD-10-CM

## 2017-01-12 ENCOUNTER — Ambulatory Visit
Admission: RE | Admit: 2017-01-12 | Discharge: 2017-01-12 | Disposition: A | Discharge status: Home / Self Care | Payer: BLUE CROSS/BLUE SHIELD | Source: Ambulatory Visit | Attending: Obstetrics and Gynecology | Admitting: Obstetrics and Gynecology

## 2017-01-12 ENCOUNTER — Ambulatory Visit: Payer: BLUE CROSS/BLUE SHIELD

## 2017-01-12 DIAGNOSIS — Z1231 Encounter for screening mammogram for malignant neoplasm of breast: Secondary | ICD-10-CM | POA: Diagnosis not present

## 2017-01-12 DIAGNOSIS — Z139 Encounter for screening, unspecified: Secondary | ICD-10-CM

## 2017-02-26 DIAGNOSIS — I889 Nonspecific lymphadenitis, unspecified: Secondary | ICD-10-CM | POA: Diagnosis not present

## 2017-03-22 DIAGNOSIS — H40013 Open angle with borderline findings, low risk, bilateral: Secondary | ICD-10-CM | POA: Diagnosis not present

## 2017-03-23 DIAGNOSIS — K219 Gastro-esophageal reflux disease without esophagitis: Secondary | ICD-10-CM | POA: Diagnosis not present

## 2017-03-23 DIAGNOSIS — E559 Vitamin D deficiency, unspecified: Secondary | ICD-10-CM | POA: Diagnosis not present

## 2017-03-23 DIAGNOSIS — M349 Systemic sclerosis, unspecified: Secondary | ICD-10-CM | POA: Diagnosis not present

## 2017-03-23 DIAGNOSIS — I10 Essential (primary) hypertension: Secondary | ICD-10-CM | POA: Diagnosis not present

## 2017-03-23 DIAGNOSIS — J309 Allergic rhinitis, unspecified: Secondary | ICD-10-CM | POA: Diagnosis not present

## 2017-04-05 DIAGNOSIS — Z79899 Other long term (current) drug therapy: Secondary | ICD-10-CM | POA: Diagnosis not present

## 2017-04-05 DIAGNOSIS — M0600 Rheumatoid arthritis without rheumatoid factor, unspecified site: Secondary | ICD-10-CM | POA: Diagnosis not present

## 2017-04-05 DIAGNOSIS — M2550 Pain in unspecified joint: Secondary | ICD-10-CM | POA: Diagnosis not present

## 2017-04-05 DIAGNOSIS — M329 Systemic lupus erythematosus, unspecified: Secondary | ICD-10-CM | POA: Diagnosis not present

## 2017-04-05 DIAGNOSIS — R29898 Other symptoms and signs involving the musculoskeletal system: Secondary | ICD-10-CM | POA: Diagnosis not present

## 2017-04-06 DIAGNOSIS — I10 Essential (primary) hypertension: Secondary | ICD-10-CM | POA: Diagnosis not present

## 2017-04-28 DIAGNOSIS — H10412 Chronic giant papillary conjunctivitis, left eye: Secondary | ICD-10-CM | POA: Diagnosis not present

## 2017-05-25 DIAGNOSIS — J45901 Unspecified asthma with (acute) exacerbation: Secondary | ICD-10-CM | POA: Diagnosis not present

## 2017-05-29 DIAGNOSIS — R05 Cough: Secondary | ICD-10-CM | POA: Diagnosis not present

## 2017-05-29 DIAGNOSIS — J309 Allergic rhinitis, unspecified: Secondary | ICD-10-CM | POA: Diagnosis not present

## 2017-05-30 DIAGNOSIS — J4521 Mild intermittent asthma with (acute) exacerbation: Secondary | ICD-10-CM | POA: Diagnosis not present

## 2017-05-30 DIAGNOSIS — J3089 Other allergic rhinitis: Secondary | ICD-10-CM | POA: Diagnosis not present

## 2017-05-30 DIAGNOSIS — J301 Allergic rhinitis due to pollen: Secondary | ICD-10-CM | POA: Diagnosis not present

## 2017-05-30 DIAGNOSIS — J3081 Allergic rhinitis due to animal (cat) (dog) hair and dander: Secondary | ICD-10-CM | POA: Diagnosis not present

## 2017-06-13 DIAGNOSIS — I10 Essential (primary) hypertension: Secondary | ICD-10-CM | POA: Diagnosis not present

## 2017-07-03 ENCOUNTER — Other Ambulatory Visit: Payer: Self-pay | Admitting: Student

## 2017-07-04 DIAGNOSIS — H40053 Ocular hypertension, bilateral: Secondary | ICD-10-CM | POA: Diagnosis not present

## 2017-07-04 DIAGNOSIS — H40013 Open angle with borderline findings, low risk, bilateral: Secondary | ICD-10-CM | POA: Diagnosis not present

## 2017-07-04 DIAGNOSIS — Z83511 Family history of glaucoma: Secondary | ICD-10-CM | POA: Diagnosis not present

## 2017-07-06 DIAGNOSIS — M329 Systemic lupus erythematosus, unspecified: Secondary | ICD-10-CM | POA: Diagnosis not present

## 2017-07-06 DIAGNOSIS — M064 Inflammatory polyarthropathy: Secondary | ICD-10-CM | POA: Diagnosis not present

## 2017-07-06 DIAGNOSIS — M2550 Pain in unspecified joint: Secondary | ICD-10-CM | POA: Diagnosis not present

## 2017-07-06 DIAGNOSIS — R21 Rash and other nonspecific skin eruption: Secondary | ICD-10-CM | POA: Diagnosis not present

## 2017-07-14 ENCOUNTER — Encounter: Payer: Self-pay | Admitting: Diagnostic Neuroimaging

## 2017-07-14 ENCOUNTER — Encounter: Payer: 59 | Admitting: Diagnostic Neuroimaging

## 2017-09-21 DIAGNOSIS — I10 Essential (primary) hypertension: Secondary | ICD-10-CM | POA: Diagnosis not present

## 2017-10-19 DIAGNOSIS — M064 Inflammatory polyarthropathy: Secondary | ICD-10-CM | POA: Diagnosis not present

## 2017-10-19 DIAGNOSIS — R748 Abnormal levels of other serum enzymes: Secondary | ICD-10-CM | POA: Diagnosis not present

## 2017-10-19 DIAGNOSIS — R21 Rash and other nonspecific skin eruption: Secondary | ICD-10-CM | POA: Diagnosis not present

## 2017-10-19 DIAGNOSIS — M2550 Pain in unspecified joint: Secondary | ICD-10-CM | POA: Diagnosis not present

## 2017-10-19 DIAGNOSIS — M329 Systemic lupus erythematosus, unspecified: Secondary | ICD-10-CM | POA: Diagnosis not present

## 2017-10-19 DIAGNOSIS — G5603 Carpal tunnel syndrome, bilateral upper limbs: Secondary | ICD-10-CM | POA: Diagnosis not present

## 2017-11-22 DIAGNOSIS — E669 Obesity, unspecified: Secondary | ICD-10-CM | POA: Diagnosis not present

## 2017-11-22 DIAGNOSIS — I10 Essential (primary) hypertension: Secondary | ICD-10-CM | POA: Diagnosis not present

## 2017-12-27 ENCOUNTER — Other Ambulatory Visit: Payer: Self-pay | Admitting: Obstetrics and Gynecology

## 2017-12-27 DIAGNOSIS — Z79899 Other long term (current) drug therapy: Secondary | ICD-10-CM | POA: Diagnosis not present

## 2017-12-27 DIAGNOSIS — M0600 Rheumatoid arthritis without rheumatoid factor, unspecified site: Secondary | ICD-10-CM | POA: Diagnosis not present

## 2017-12-27 DIAGNOSIS — M329 Systemic lupus erythematosus, unspecified: Secondary | ICD-10-CM | POA: Diagnosis not present

## 2017-12-27 DIAGNOSIS — M2550 Pain in unspecified joint: Secondary | ICD-10-CM | POA: Diagnosis not present

## 2017-12-27 DIAGNOSIS — Z1231 Encounter for screening mammogram for malignant neoplasm of breast: Secondary | ICD-10-CM

## 2018-01-16 DIAGNOSIS — L309 Dermatitis, unspecified: Secondary | ICD-10-CM | POA: Diagnosis not present

## 2018-02-03 ENCOUNTER — Ambulatory Visit
Admission: RE | Admit: 2018-02-03 | Discharge: 2018-02-03 | Disposition: A | Discharge status: Home / Self Care | Payer: BLUE CROSS/BLUE SHIELD | Source: Ambulatory Visit | Attending: Obstetrics and Gynecology | Admitting: Obstetrics and Gynecology

## 2018-02-03 DIAGNOSIS — Z1231 Encounter for screening mammogram for malignant neoplasm of breast: Secondary | ICD-10-CM

## 2018-02-22 ENCOUNTER — Emergency Department (HOSPITAL_COMMUNITY)
Admission: EM | Admit: 2018-02-22 | Discharge: 2018-02-22 | Disposition: A | Discharge status: Home / Self Care | Payer: BLUE CROSS/BLUE SHIELD | Attending: Emergency Medicine | Admitting: Emergency Medicine

## 2018-02-22 ENCOUNTER — Encounter (HOSPITAL_COMMUNITY): Payer: Self-pay

## 2018-02-22 DIAGNOSIS — J45909 Unspecified asthma, uncomplicated: Secondary | ICD-10-CM | POA: Insufficient documentation

## 2018-02-22 DIAGNOSIS — Z7982 Long term (current) use of aspirin: Secondary | ICD-10-CM | POA: Insufficient documentation

## 2018-02-22 DIAGNOSIS — I10 Essential (primary) hypertension: Secondary | ICD-10-CM | POA: Diagnosis not present

## 2018-02-22 DIAGNOSIS — G43101 Migraine with aura, not intractable, with status migrainosus: Secondary | ICD-10-CM | POA: Diagnosis not present

## 2018-02-22 DIAGNOSIS — Z79899 Other long term (current) drug therapy: Secondary | ICD-10-CM | POA: Diagnosis not present

## 2018-02-22 DIAGNOSIS — Z91040 Latex allergy status: Secondary | ICD-10-CM | POA: Diagnosis not present

## 2018-02-22 DIAGNOSIS — R51 Headache: Secondary | ICD-10-CM | POA: Diagnosis not present

## 2018-02-22 LAB — CBC
HCT: 35.2 % — ABNORMAL LOW (ref 36.0–46.0)
Hemoglobin: 11.7 g/dL — ABNORMAL LOW (ref 12.0–15.0)
MCH: 31.4 pg (ref 26.0–34.0)
MCHC: 33.2 g/dL (ref 30.0–36.0)
MCV: 94.4 fL (ref 80.0–100.0)
Platelets: 195 10*3/uL (ref 150–400)
RBC: 3.73 MIL/uL — ABNORMAL LOW (ref 3.87–5.11)
RDW: 13.1 % (ref 11.5–15.5)
WBC: 2.8 10*3/uL — ABNORMAL LOW (ref 4.0–10.5)
nRBC: 0 % (ref 0.0–0.2)

## 2018-02-22 LAB — BASIC METABOLIC PANEL
Anion gap: 7 (ref 5–15)
BUN: 18 mg/dL (ref 6–20)
CO2: 26 mmol/L (ref 22–32)
Calcium: 9.5 mg/dL (ref 8.9–10.3)
Chloride: 106 mmol/L (ref 98–111)
Creatinine, Ser: 0.88 mg/dL (ref 0.44–1.00)
GFR calc Af Amer: >60 mL/min (ref >60)
GFR calc non Af Amer: >60 mL/min (ref >60)
Glucose, Bld: 94 mg/dL (ref 70–99)
Potassium: 4.2 mmol/L (ref 3.5–5.1)
Sodium: 139 mmol/L (ref 135–145)

## 2018-02-22 MED ORDER — SODIUM CHLORIDE 0.9 % IV BOLUS
500.0000 mL | Freq: Once | INTRAVENOUS | Status: AC
  Administered 2018-02-22: 500 mL via INTRAVENOUS

## 2018-02-22 MED ORDER — PROMETHAZINE HCL 25 MG/ML IJ SOLN
12.5000 mg | Freq: Once | INTRAMUSCULAR | Status: AC
  Administered 2018-02-22: 12.5 mg via INTRAVENOUS
  Filled 2018-02-22: qty 1

## 2018-02-22 MED ORDER — DEXAMETHASONE SODIUM PHOSPHATE 4 MG/ML IJ SOLN
8.0000 mg | Freq: Once | INTRAMUSCULAR | Status: AC
  Administered 2018-02-22: 8 mg via INTRAVENOUS
  Filled 2018-02-22: qty 2

## 2018-02-22 MED ORDER — KETOROLAC TROMETHAMINE 30 MG/ML IJ SOLN
15.0000 mg | Freq: Once | INTRAMUSCULAR | Status: AC
  Administered 2018-02-22: 15 mg via INTRAVENOUS
  Filled 2018-02-22: qty 1

## 2018-02-22 NOTE — ED Provider Notes (Signed)
[facility] DEPT Provider Note   CSN: [id number] Arrival date & time: 02/22/18  3545     History   Chief Complaint Chief Complaint  Patient presents with  . Migraine    HPI Cynthia Frye is a 58 y.o. female.  HPI Patient presents with a headache.  Somewhat dull behind her right eye.  States she saw some wavy lines in it when the headache started.  Does have a history of headaches but usually not this severe.  Usually they will go away with Excedrin Migraine.  Has had this on and off now for the last 3 days.  Excedrin Migraine will usually improve it but did not today.  No trauma.  No fevers.  Light does bother her somewhat.  States that the night before it started she had been wearing some magnifying glasses and thinks that could have caused it.  States she been doing more reading.  No numbness or weakness.  No confusion.  No nausea or vomiting.  Has had previous CT scans, although not necessarily for the headache she has had her head imaged.  History of lupus.  No fevers or chills. Past Medical History:  Diagnosis Date  . Asthma    allergy shots and medication  . DDD (degenerative disc disease), lumbar   . Fibroids   . Heart murmur    Echo 1/18: EF 55-60, no RWMA, normal diastolic function, trivial AI, PASP 32  . History of cardiac catheterization    LHC 3/18: normal coronary arteries.   . History of nuclear stress test    ETT-Myoview 2/18: EF 62, + ECG response; apical and distal septal ischemia; intermediate risk.  Marland Kitchen Hypertension   . Iron deficiency anemia   . Lupus [facility]) 2011   Dr. Trudie Reed  . Obesity   . Seasonal allergies   . Seizure in childhood [facility])    as a child no treatment none x 30 years    Patient Active Problem List   Diagnosis Date Noted  . Essential hypertension 04/29/2016  . Lupus (systemic lupus erythematosus) ([place]) 04/29/2016  . Asthma 04/29/2016  . Seasonal allergies 04/29/2016  . Abnormal stress test 04/09/2016      Past Surgical History:  Procedure Laterality Date  . ABDOMINAL HYSTERECTOMY    . BACK SURGERY  2009  . COLONOSCOPY WITH PROPOFOL N/A 04/11/2012   Procedure: COLONOSCOPY WITH PROPOFOL;  Surgeon: Garlan Fair, MD;  Location: [facility] ENDOSCOPY;  Service: Endoscopy;  Laterality: N/A;  . HERNIA REPAIR     as child unbilical  . LEFT HEART CATH AND CORONARY ANGIOGRAPHY N/A 04/09/2016   Procedure: Left Heart Cath and Coronary Angiography;  Surgeon: Nelva Bush, MD;  Location: [facility] LAB;  Service: Cardiovascular;  Laterality: N/A;  . MOUTH SURGERY       OB History   No obstetric history on file.      Home Medications    Prior to Admission medications   Medication Sig Start Date End Date Taking? Authorizing Provider  albuterol (PROVENTIL HFA;VENTOLIN HFA) 108 (90 Base) MCG/ACT inhaler Inhale 2 puffs into the lungs every 6 (six) hours as needed for wheezing or shortness of breath. 04/29/16   Harrison Mons, PA  amLODipine (NORVASC) 5 MG tablet Take 2.5 mg by mouth daily. TAKES 0.5 TABLET    [provider]  aspirin 81 MG tablet Take 81 mg by mouth daily.    [provider]  aspirin-acetaminophen-caffeine (EXCEDRIN MIGRAINE) [phone] MG tablet Take 2 tablets  by mouth every 6 (six) hours as needed for headache.    [provider]  benzonatate (TESSALON) 100 MG capsule Take 1-2 capsules (100-200 mg total) by mouth 3 (three) times daily as needed for cough. 04/29/16   Harrison Mons, PA  Bepotastine Besilate (BEPREVE) 1.5 % SOLN Place 1 drop into both eyes 2 (two) times daily as needed (DRY EYES).    [provider]  Cholecalciferol (VITAMIN D) 2000 units CAPS Take 4,000 Units by mouth daily.    [provider]  diclofenac (VOLTAREN) 75 MG EC tablet Take 75 mg by mouth 2 (two) times daily.    [provider]  Dietary Management Product (RHEUMATE) CAPS Take 1 capsule by mouth daily. [place] FROM DR    [provider]  DIPROLENE 0.05 % ointment Apply 1 application topically as needed (SKIN IRRITATION).  04/08/15   [provider]  ferrous sulfate 325 (65 FE) MG tablet Take 325 mg by mouth daily with breakfast.    [provider]  fexofenadine (ALLEGRA) 180 MG tablet Take 180 mg by mouth daily.    [provider]  Flaxseed, Linseed, (FLAXSEED OIL) 1000 MG CAPS Take 1,000 mg by mouth daily.    [provider]  Fluocinolone Acetonide Scalp 0.01 % OIL Apply 1 application topically daily as needed for itching. 01/13/16   [provider]  fluticasone (FLONASE) 50 MCG/ACT nasal spray Place 2 sprays into both nostrils daily. 04/29/16   Harrison Mons, PA  hydroxychloroquine (PLAQUENIL) 200 MG tablet TAKE 2 TABLETS BY MOUTH ONCE A DAY WITH FOOD OR MILK 06/17/14   [provider]  losartan (COZAAR) 50 MG tablet Take 50 mg by mouth every evening.  02/20/16   [provider]  magic mouthwash w/lidocaine SOLN Take 10 mLs by mouth every 2 (two) hours as needed for mouth pain. 04/29/16   Harrison Mons, PA  methotrexate 50 MG/2ML injection INJECT 0.8 ML ONCE WEEKLY OR AS DIRECTED On SUNDAY 06/17/14   [provider]  metoprolol succinate (TOPROL-XL) 25 MG 24 hr tablet Take 25 mg by mouth daily. 02/20/16   [provider]  oxyCODONE-acetaminophen (PERCOCET/ROXICET) 5-325 MG per tablet Take 1 tablet by mouth every 6 (six) hours as needed for moderate pain or severe pain.     [provider]    Family History Family History  Problem Relation Age of Onset  . Asthma Mother   . Diabetes Mother   . Heart attack Father 60  . Stroke Maternal Aunt   . Stroke Paternal Aunt   . Stroke Paternal Uncle     Social History Social History   Tobacco Use  . Smoking status: Never Smoker  . Smokeless tobacco: Never Used  Substance Use Topics  . Alcohol use: No  . Drug use: No     Allergies   Cinnamon; Other; Peanut-containing drug  products; Corn oil; and Corn-containing products   Review of Systems Review of Systems  Constitutional: Negative for appetite change and fever.  HENT: Negative for congestion.   Eyes: Positive for photophobia and visual disturbance. Negative for redness.  Respiratory: Negative for shortness of breath.   Cardiovascular: Negative for chest pain.  Gastrointestinal: Negative for abdominal pain.  Genitourinary: Negative for flank pain.  Musculoskeletal: Negative for gait problem.  Skin: Negative for rash.  Neurological: Positive for headaches.  Psychiatric/Behavioral: Negative for confusion.     Physical Exam Updated Vital Signs BP (!) 142/77 (BP Location: Right Arm)  Pulse 68   Temp 98.1 F (36.7 C) (Oral)   Resp 17   SpO2 100%   Physical Exam Vitals signs and nursing note reviewed.  HENT:     Head: Atraumatic.     Right Ear: Tympanic membrane normal.     Left Ear: Tympanic membrane normal.     Mouth/Throat:     Mouth: Mucous membranes are moist.  Eyes:     Extraocular Movements: Extraocular movements intact.     Pupils: Pupils are equal, round, and reactive to light.  Neck:     Musculoskeletal: Neck supple.  Cardiovascular:     Rate and Rhythm: Normal rate and regular rhythm.  Pulmonary:     Effort: Pulmonary effort is normal.     Breath sounds: Normal breath sounds.  Abdominal:     Palpations: Abdomen is soft.  Musculoskeletal:     Right lower leg: No edema.     Left lower leg: No edema.  Skin:    General: Skin is warm.     Capillary Refill: Capillary refill takes less than 2 seconds.  Neurological:     General: No focal deficit present.     Mental Status: She is alert and oriented to person, place, and time.  Psychiatric:        Mood and Affect: Mood normal.      ED Treatments / Results  Labs (all labs ordered are listed, but only abnormal results are displayed) Labs Reviewed  CBC - Abnormal; Notable for the following components:      Result Value     WBC 2.8 (*)    RBC 3.73 (*)    Hemoglobin 11.7 (*)    HCT 35.2 (*)    All other components within normal limits  BASIC METABOLIC PANEL    EKG None  Radiology No results found.  Procedures Procedures (including critical care time)  Medications Ordered in ED Medications  ketorolac (TORADOL) 30 MG/ML injection 15 mg (15 mg Intravenous Given 02/22/18 0750)  promethazine (PHENERGAN) injection 12.5 mg (12.5 mg Intravenous Given 02/22/18 0750)  sodium chloride 0.9 % bolus 500 mL (0 mLs Intravenous Stopped 02/22/18 0912)  dexamethasone (DECADRON) injection 8 mg (8 mg Intravenous Given 02/22/18 0916)     Initial Impression / Assessment and Plan / ED Course  I have reviewed the triage vital signs and the nursing notes.  Pertinent labs & imaging results that were available during my care of the patient were reviewed by me and considered in my medical decision making (see chart for details).     Patient with headache.  History of migraines.  Had visual scotoma before the headache.  Feeling somewhat better after treatment.  Has had previous head CT.  Will discharge home.  Outpatient follow-up as needed. I think migraine most likely cause.  Cluster headaches less likely although has had episodes over the last 3 days.  Oxygen did not help with his headache. Final Clinical Impressions(s) / ED Diagnoses   Final diagnoses:  Migraine with aura and with status migrainosus, not intractable    ED Discharge Orders    None       Davonna Belling, MD 02/22/18 [phone]

## 2018-02-22 NOTE — ED Triage Notes (Signed)
Pt complains of a migraine for three days, no relief with OTC meds, pt denies any vomiting

## 2018-02-23 DIAGNOSIS — I10 Essential (primary) hypertension: Secondary | ICD-10-CM | POA: Diagnosis not present

## 2018-02-23 DIAGNOSIS — G43511 Persistent migraine aura without cerebral infarction, intractable, with status migrainosus: Secondary | ICD-10-CM | POA: Diagnosis not present

## 2018-03-22 DIAGNOSIS — E039 Hypothyroidism, unspecified: Secondary | ICD-10-CM | POA: Diagnosis not present

## 2018-03-22 DIAGNOSIS — M349 Systemic sclerosis, unspecified: Secondary | ICD-10-CM | POA: Diagnosis not present

## 2018-03-22 DIAGNOSIS — Z0000 Encounter for general adult medical examination without abnormal findings: Secondary | ICD-10-CM | POA: Diagnosis not present

## 2018-03-22 DIAGNOSIS — E559 Vitamin D deficiency, unspecified: Secondary | ICD-10-CM | POA: Diagnosis not present

## 2018-03-22 DIAGNOSIS — I10 Essential (primary) hypertension: Secondary | ICD-10-CM | POA: Diagnosis not present

## 2018-03-22 DIAGNOSIS — D509 Iron deficiency anemia, unspecified: Secondary | ICD-10-CM | POA: Diagnosis not present

## 2018-03-23 DIAGNOSIS — H40023 Open angle with borderline findings, high risk, bilateral: Secondary | ICD-10-CM | POA: Diagnosis not present

## 2018-04-03 DIAGNOSIS — M0600 Rheumatoid arthritis without rheumatoid factor, unspecified site: Secondary | ICD-10-CM | POA: Diagnosis not present

## 2018-04-03 DIAGNOSIS — Z79899 Other long term (current) drug therapy: Secondary | ICD-10-CM | POA: Diagnosis not present

## 2018-04-03 DIAGNOSIS — M329 Systemic lupus erythematosus, unspecified: Secondary | ICD-10-CM | POA: Diagnosis not present

## 2018-04-03 DIAGNOSIS — M2550 Pain in unspecified joint: Secondary | ICD-10-CM | POA: Diagnosis not present

## 2018-06-13 DIAGNOSIS — H10412 Chronic giant papillary conjunctivitis, left eye: Secondary | ICD-10-CM | POA: Diagnosis not present

## 2018-07-10 DIAGNOSIS — M2550 Pain in unspecified joint: Secondary | ICD-10-CM | POA: Diagnosis not present

## 2018-07-10 DIAGNOSIS — Z79899 Other long term (current) drug therapy: Secondary | ICD-10-CM | POA: Diagnosis not present

## 2018-07-10 DIAGNOSIS — M329 Systemic lupus erythematosus, unspecified: Secondary | ICD-10-CM | POA: Diagnosis not present

## 2018-07-10 DIAGNOSIS — M0600 Rheumatoid arthritis without rheumatoid factor, unspecified site: Secondary | ICD-10-CM | POA: Diagnosis not present

## 2018-07-25 DIAGNOSIS — H40023 Open angle with borderline findings, high risk, bilateral: Secondary | ICD-10-CM | POA: Diagnosis not present

## 2018-09-28 DIAGNOSIS — I10 Essential (primary) hypertension: Secondary | ICD-10-CM | POA: Diagnosis not present

## 2018-10-16 DIAGNOSIS — I10 Essential (primary) hypertension: Secondary | ICD-10-CM | POA: Diagnosis not present

## 2018-10-16 DIAGNOSIS — E559 Vitamin D deficiency, unspecified: Secondary | ICD-10-CM | POA: Diagnosis not present

## 2018-10-16 DIAGNOSIS — M349 Systemic sclerosis, unspecified: Secondary | ICD-10-CM | POA: Diagnosis not present

## 2018-10-16 DIAGNOSIS — D61818 Other pancytopenia: Secondary | ICD-10-CM | POA: Diagnosis not present

## 2018-10-16 DIAGNOSIS — D509 Iron deficiency anemia, unspecified: Secondary | ICD-10-CM | POA: Diagnosis not present

## 2018-10-19 DIAGNOSIS — J3081 Allergic rhinitis due to animal (cat) (dog) hair and dander: Secondary | ICD-10-CM | POA: Diagnosis not present

## 2018-10-19 DIAGNOSIS — J301 Allergic rhinitis due to pollen: Secondary | ICD-10-CM | POA: Diagnosis not present

## 2018-10-19 DIAGNOSIS — H1045 Other chronic allergic conjunctivitis: Secondary | ICD-10-CM | POA: Diagnosis not present

## 2018-10-19 DIAGNOSIS — J3089 Other allergic rhinitis: Secondary | ICD-10-CM | POA: Diagnosis not present

## 2018-10-30 DIAGNOSIS — R21 Rash and other nonspecific skin eruption: Secondary | ICD-10-CM | POA: Diagnosis not present

## 2018-10-30 DIAGNOSIS — Z79899 Other long term (current) drug therapy: Secondary | ICD-10-CM | POA: Diagnosis not present

## 2018-10-30 DIAGNOSIS — M329 Systemic lupus erythematosus, unspecified: Secondary | ICD-10-CM | POA: Diagnosis not present

## 2018-10-30 DIAGNOSIS — M2550 Pain in unspecified joint: Secondary | ICD-10-CM | POA: Diagnosis not present

## 2018-11-20 DIAGNOSIS — Z79899 Other long term (current) drug therapy: Secondary | ICD-10-CM | POA: Diagnosis not present

## 2018-11-20 DIAGNOSIS — M0600 Rheumatoid arthritis without rheumatoid factor, unspecified site: Secondary | ICD-10-CM | POA: Diagnosis not present

## 2018-12-15 ENCOUNTER — Other Ambulatory Visit: Payer: Self-pay | Admitting: Obstetrics and Gynecology

## 2018-12-15 DIAGNOSIS — Z1231 Encounter for screening mammogram for malignant neoplasm of breast: Secondary | ICD-10-CM

## 2019-01-08 DIAGNOSIS — M2550 Pain in unspecified joint: Secondary | ICD-10-CM | POA: Diagnosis not present

## 2019-01-08 DIAGNOSIS — M0600 Rheumatoid arthritis without rheumatoid factor, unspecified site: Secondary | ICD-10-CM | POA: Diagnosis not present

## 2019-01-08 DIAGNOSIS — M329 Systemic lupus erythematosus, unspecified: Secondary | ICD-10-CM | POA: Diagnosis not present

## 2019-01-08 DIAGNOSIS — R202 Paresthesia of skin: Secondary | ICD-10-CM | POA: Diagnosis not present

## 2019-01-24 DIAGNOSIS — M180 Bilateral primary osteoarthritis of first carpometacarpal joints: Secondary | ICD-10-CM | POA: Diagnosis not present

## 2019-01-24 DIAGNOSIS — R200 Anesthesia of skin: Secondary | ICD-10-CM | POA: Diagnosis not present

## 2019-01-25 ENCOUNTER — Encounter: Payer: Self-pay | Admitting: Neurology

## 2019-01-25 ENCOUNTER — Other Ambulatory Visit: Payer: Self-pay

## 2019-01-25 DIAGNOSIS — R200 Anesthesia of skin: Secondary | ICD-10-CM

## 2019-01-29 DIAGNOSIS — I10 Essential (primary) hypertension: Secondary | ICD-10-CM | POA: Diagnosis not present

## 2019-01-29 DIAGNOSIS — Z6835 Body mass index (BMI) 35.0-35.9, adult: Secondary | ICD-10-CM | POA: Diagnosis not present

## 2019-02-01 DIAGNOSIS — M79644 Pain in right finger(s): Secondary | ICD-10-CM | POA: Diagnosis not present

## 2019-02-01 DIAGNOSIS — M180 Bilateral primary osteoarthritis of first carpometacarpal joints: Secondary | ICD-10-CM | POA: Diagnosis not present

## 2019-02-05 ENCOUNTER — Other Ambulatory Visit: Payer: Self-pay

## 2019-02-05 ENCOUNTER — Ambulatory Visit
Admission: RE | Admit: 2019-02-05 | Discharge: 2019-02-05 | Disposition: A | Discharge status: Home / Self Care | Payer: BLUE CROSS/BLUE SHIELD | Source: Ambulatory Visit | Attending: Obstetrics and Gynecology | Admitting: Obstetrics and Gynecology

## 2019-02-05 DIAGNOSIS — Z1231 Encounter for screening mammogram for malignant neoplasm of breast: Secondary | ICD-10-CM | POA: Diagnosis not present

## 2019-02-08 DIAGNOSIS — M180 Bilateral primary osteoarthritis of first carpometacarpal joints: Secondary | ICD-10-CM | POA: Diagnosis not present

## 2019-02-08 DIAGNOSIS — M79645 Pain in left finger(s): Secondary | ICD-10-CM | POA: Diagnosis not present

## 2019-02-12 ENCOUNTER — Ambulatory Visit (INDEPENDENT_AMBULATORY_CARE_PROVIDER_SITE_OTHER): Payer: BC Managed Care – PPO

## 2019-02-12 ENCOUNTER — Encounter: Payer: Self-pay | Admitting: Podiatry

## 2019-02-12 ENCOUNTER — Ambulatory Visit: Payer: BC Managed Care – PPO | Admitting: Podiatry

## 2019-02-12 ENCOUNTER — Other Ambulatory Visit: Payer: Self-pay

## 2019-02-12 ENCOUNTER — Other Ambulatory Visit: Payer: Self-pay | Admitting: Podiatry

## 2019-02-12 VITALS — BP 164/89 | HR 78 | Temp 96.8°F

## 2019-02-12 DIAGNOSIS — M779 Enthesopathy, unspecified: Secondary | ICD-10-CM

## 2019-02-12 DIAGNOSIS — M7752 Other enthesopathy of left foot: Secondary | ICD-10-CM | POA: Diagnosis not present

## 2019-02-12 DIAGNOSIS — M79672 Pain in left foot: Secondary | ICD-10-CM | POA: Diagnosis not present

## 2019-02-14 ENCOUNTER — Other Ambulatory Visit: Payer: Self-pay

## 2019-02-14 ENCOUNTER — Ambulatory Visit (INDEPENDENT_AMBULATORY_CARE_PROVIDER_SITE_OTHER): Payer: BC Managed Care – PPO | Admitting: Neurology

## 2019-02-14 DIAGNOSIS — G5603 Carpal tunnel syndrome, bilateral upper limbs: Secondary | ICD-10-CM

## 2019-02-14 DIAGNOSIS — R200 Anesthesia of skin: Secondary | ICD-10-CM | POA: Diagnosis not present

## 2019-02-14 NOTE — Procedures (Signed)
[facility] Neurology  [place], [address] [place]  [place],  [address] Tel: [phone] Fax:  [phone] Test Date:  02/14/2019  Patient: Cynthia Frye DOB: November 09, 1960 Physician: Narda Amber, DO  Sex: Female Height: 5\' 7"  Ref Phys: Daryll Brod, MD  ID#: [id number] Temp: 33.0C Technician:    Patient Complaints: This is a 59 year old female referred for evaluation of bilateral hand pain and tingling.  NCV & EMG Findings: Electrodiagnostic testing was limited to nerve conduction studies only due to pain associated with needle electrode examination.  Findings are as follows: 1. Bilateral median sensory responses show prolonged latency (R3.8, L3.7 ms).  Left ulnar sensory response shows prolonged latency (3.7 ms).  Right ulnar sensory response is within normal limits. 2. Left ulnar motor response shows slowed conduction velocity across the elbow (A Elbow-B Elbow, 48 m/s).  Bilateral median and right ulnar motor responses are within normal limits.   3. Needle electrode examination was prematurely terminated due to strong withdrawal response from pain upon needle insertion compromising patient and provider safety.  Impression: This is a limited to nerve conduction studies only due to pain associated with needle electrode examination.  Findings include: 1. 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   2. Left ulnar neuropathy with slowing across the elbow, purely demyelinating, mild. 3. A superimposed cervical radiculopathy cannot be excluded by this study.    ___________________________ Narda Amber, DO    Nerve Conduction Studies Anti Sensory Summary Table   Site NR Peak (ms) Norm Peak (ms) P-T Amp (V) Norm P-T Amp  Left Median Anti Sensory (2nd Digit)  33C  Wrist    3.8 <3.6 20.4 >15  Right Median Anti Sensory (2nd Digit)  33C  Wrist    3.7 <3.6 17.5 >15  Left Ulnar Anti Sensory (5th  Digit)  33C  Wrist    3.7 <3.1 18.5 >10  Right Ulnar Anti Sensory (5th Digit)  33C  Wrist    3.1 <3.1 15.0 >10   Motor Summary Table   Site NR Onset (ms) Norm Onset (ms) O-P Amp (mV) Norm O-P Amp Site1 Site2 Delta-0 (ms) Dist (cm) Vel (m/s) Norm Vel (m/s)  Left Median Motor (Abd Poll Brev)  33C  Wrist    3.3 <4.0 11.0 >6 Elbow Wrist 5.5 31.0 56 >50  Elbow    8.8  10.8         Right Median Motor (Abd Poll Brev)  33C  Wrist    3.0 <4.0 13.7 >6 Elbow Wrist 5.6 32.0 57 >50  Elbow    8.6  13.0         Left Ulnar Motor (Abd Dig Minimi)  33C  Wrist    3.0 <3.1 14.2 >7 B Elbow Wrist 4.2 26.0 62 >50  B Elbow    7.2  13.2  A Elbow B Elbow 2.1 10.0 48 >50  A Elbow    9.3  12.8         Right Ulnar Motor (Abd Dig Minimi)  33C  Wrist    2.3 <3.1 10.4 >7 B Elbow Wrist 5.0 26.0 52 >50  B Elbow    7.3  9.5  A Elbow B Elbow 1.8 10.0 56 >50  A Elbow    9.1  8.8          EMG   Side Muscle Ins Act Fibs Psw Fasc Number Recrt Dur Dur. Amp Amp. Poly Poly. Comment  Right 1stDorInt Nml Nml Nml Nml Nml Nml Nml Nml Nml Nml Nml Nml N/A      Waveforms:

## 2019-02-15 DIAGNOSIS — G43909 Migraine, unspecified, not intractable, without status migrainosus: Secondary | ICD-10-CM | POA: Diagnosis not present

## 2019-02-20 NOTE — Progress Notes (Signed)
Subjective:   Patient ID: Cynthia Frye, female   DOB: 59 y.o.   MRN: [id number]   HPI Patient presents stating she is developed progressive pain in her left foot and at times it seems to be really bad and at other times not so bad.  She is had a history of back surgery and lupus and does not smoke and would like to be more active   Review of Systems  All other systems reviewed and are negative.       Objective:  Physical Exam Vitals and nursing note reviewed.  Constitutional:      Appearance: She is well-developed.  Pulmonary:     Effort: Pulmonary effort is normal.  Musculoskeletal:        General: Normal range of motion.  Skin:    General: Skin is warm.  Neurological:     Mental Status: She is alert.     Neurovascular status found to be intact muscle strength adequate range of motion within normal limits.  Patient is noted to have discomfort in the left foot around the third MPJ with inflammation fluid of the joint surface and also has some indications of possible neuropathic-like pain     Assessment:  Probability for capsulitis forefoot left with possibility also for low-grade chronic neuropathy 8     Plan:  P reviewed both conditions and at this point we will get a focus on the inflamed joint and I did proximal nerve block aspirated the joint getting out a small amount of clear fluid and injected quarter cc dexamethasone Kenalog and applied thick padding to reduce pressure.  Reappoint to recheck and decide if anything else is appropriate  X-rays were negative for signs of fracture or deep-seated osteoarthritis.  Appears to be soft tissue inflammation

## 2019-02-22 DIAGNOSIS — M180 Bilateral primary osteoarthritis of first carpometacarpal joints: Secondary | ICD-10-CM | POA: Diagnosis not present

## 2019-02-22 DIAGNOSIS — M79644 Pain in right finger(s): Secondary | ICD-10-CM | POA: Diagnosis not present

## 2019-02-26 DIAGNOSIS — G5622 Lesion of ulnar nerve, left upper limb: Secondary | ICD-10-CM | POA: Diagnosis not present

## 2019-02-26 DIAGNOSIS — G5603 Carpal tunnel syndrome, bilateral upper limbs: Secondary | ICD-10-CM | POA: Diagnosis not present

## 2019-03-05 ENCOUNTER — Encounter: Payer: Self-pay | Admitting: Podiatry

## 2019-03-05 ENCOUNTER — Other Ambulatory Visit: Payer: Self-pay

## 2019-03-05 ENCOUNTER — Ambulatory Visit: Payer: BLUE CROSS/BLUE SHIELD | Admitting: Podiatry

## 2019-03-05 VITALS — Temp 97.1°F

## 2019-03-05 DIAGNOSIS — M779 Enthesopathy, unspecified: Secondary | ICD-10-CM

## 2019-03-08 NOTE — Progress Notes (Signed)
Subjective:   Patient ID: Cynthia Frye, female   DOB: 59 y.o.   MRN: [id number]   HPI Patient states she is improved but she still has 1 area of inflammation in her left forefoot   ROS      Objective:  Physical Exam  Neurovascular status intact with the patient's left forefoot showing reduced inflammation pain but there is 1 area still around the second metatarsal phalangeal joint that sore     Assessment:  Capsulitis improved but present in a different area     Plan:  Sterile prep and I went ahead and did a small injection of this area with 3 mg dexamethasone 5 mg Xylocaine and advised on reduced activity rigid bottom shoes and if any other issues were to occur with the joints patient is to reappoint

## 2019-03-12 DIAGNOSIS — G43909 Migraine, unspecified, not intractable, without status migrainosus: Secondary | ICD-10-CM | POA: Diagnosis not present

## 2019-03-12 DIAGNOSIS — I10 Essential (primary) hypertension: Secondary | ICD-10-CM | POA: Diagnosis not present

## 2019-03-23 DIAGNOSIS — H40023 Open angle with borderline findings, high risk, bilateral: Secondary | ICD-10-CM | POA: Diagnosis not present

## 2019-04-09 ENCOUNTER — Telehealth: Payer: Self-pay | Admitting: *Deleted

## 2019-04-09 ENCOUNTER — Emergency Department (HOSPITAL_COMMUNITY)
Admission: EM | Admit: 2019-04-09 | Discharge: 2019-04-09 | Disposition: A | Discharge status: Home / Self Care | Payer: BC Managed Care – PPO | Attending: Emergency Medicine | Admitting: Emergency Medicine

## 2019-04-09 ENCOUNTER — Encounter (HOSPITAL_COMMUNITY): Payer: Self-pay | Admitting: Emergency Medicine

## 2019-04-09 ENCOUNTER — Other Ambulatory Visit: Payer: Self-pay

## 2019-04-09 DIAGNOSIS — Z91010 Allergy to peanuts: Secondary | ICD-10-CM | POA: Diagnosis not present

## 2019-04-09 DIAGNOSIS — R519 Headache, unspecified: Secondary | ICD-10-CM | POA: Diagnosis not present

## 2019-04-09 DIAGNOSIS — Z79899 Other long term (current) drug therapy: Secondary | ICD-10-CM | POA: Diagnosis not present

## 2019-04-09 DIAGNOSIS — M3210 Systemic lupus erythematosus, organ or system involvement unspecified: Secondary | ICD-10-CM | POA: Insufficient documentation

## 2019-04-09 DIAGNOSIS — G43109 Migraine with aura, not intractable, without status migrainosus: Secondary | ICD-10-CM | POA: Insufficient documentation

## 2019-04-09 MED ORDER — PROCHLORPERAZINE EDISYLATE 10 MG/2ML IJ SOLN
10.0000 mg | Freq: Once | INTRAMUSCULAR | Status: AC
  Administered 2019-04-09: 10 mg via INTRAVENOUS
  Filled 2019-04-09: qty 2

## 2019-04-09 MED ORDER — DEXAMETHASONE SODIUM PHOSPHATE 10 MG/ML IJ SOLN
10.0000 mg | Freq: Once | INTRAMUSCULAR | Status: AC
  Administered 2019-04-09: 10 mg via INTRAVENOUS
  Filled 2019-04-09: qty 1

## 2019-04-09 MED ORDER — SODIUM CHLORIDE 0.9 % IV BOLUS
1000.0000 mL | Freq: Once | INTRAVENOUS | Status: AC
  Administered 2019-04-09: 1000 mL via INTRAVENOUS

## 2019-04-09 MED ORDER — ACETAMINOPHEN 500 MG PO TABS
1000.0000 mg | ORAL_TABLET | Freq: Once | ORAL | Status: AC
  Administered 2019-04-09: 1000 mg via ORAL
  Filled 2019-04-09: qty 2

## 2019-04-09 MED ORDER — DIPHENHYDRAMINE HCL 50 MG/ML IJ SOLN
25.0000 mg | Freq: Once | INTRAMUSCULAR | Status: AC
  Administered 2019-04-09: 25 mg via INTRAVENOUS
  Filled 2019-04-09: qty 1

## 2019-04-09 NOTE — ED Notes (Signed)
Pt wheeled to ED RM 27 from [place]. Pt is A&Ox4, in NAD. VSS on monitors. Neuro checks stable and intact. Pt c/o migraine that started when she woke up this AM. Reports some dizziness and nausea when upon waking up. States she has had 4 migraines this weekend and 2 during the past week. +photosensitivity. Hx of migraines but reports she has never had them as frequently and her meds from her PCP have not been effective.

## 2019-04-09 NOTE — ED Notes (Signed)
Dr. Cardama at bedside.  

## 2019-04-09 NOTE — ED Notes (Signed)
PIV initiated, 20 G to [place]. IV flushes with 10 cc NS without s/s of infiltration. Positive blood return. Secured with tape and tegaderm. Labs drawn and labeled with 2 pt identifiers

## 2019-04-09 NOTE — ED Notes (Signed)
All meds given per MAR. Name/DOB verified with pt 

## 2019-04-09 NOTE — ED Triage Notes (Signed)
Pt in with migraine, ongoing since the weekend. States she has been dealing w/persistent migraines x 1 mo. PCP has prescribed meds that give little relief. Denies any neuro changes, is photosensitive and states she sees "rays" during the HA's.

## 2019-04-09 NOTE — ED Notes (Signed)
Care endorsed to Kathlee Nations, [place]

## 2019-04-09 NOTE — ED Provider Notes (Signed)
[facility] EMERGENCY DEPARTMENT Provider Note  CSN: [id number] Arrival date & time: 04/09/19 0516  Chief Complaint(s) Migraine  HPI Cynthia Frye is a 59 y.o. female with a past medical history listed below including lupus, and migraines who presents to the emergency department with 1 month of recurrent migraines.  Patient reports that previously she with have her episodes however over the past month she has been having episodes every 2 to 3 days.  She has been taking prescribed medications given to her by her PCP in addition to Advil.  She believes that she is taking too much Advil reports taking 1 or 2 doses per day.  Her headache does resolve after medication however returns the next day or the following day.  She denies any recent fevers or infections.  Denies any associated visual disturbance or focal deficits.  No nausea or vomiting.  No diarrhea.  Patient endorses poor diet.  HPI  Past Medical History Past Medical History:  Diagnosis Date  . Asthma    allergy shots and medication  . DDD (degenerative disc disease), lumbar   . Fibroids   . Heart murmur    Echo 1/18: EF 55-60, no RWMA, normal diastolic function, trivial AI, PASP 32  . History of cardiac catheterization    LHC 3/18: normal coronary arteries.   . History of nuclear stress test    ETT-Myoview 2/18: EF 62, + ECG response; apical and distal septal ischemia; intermediate risk.  Marland Kitchen Hypertension   . Iron deficiency anemia   . Lupus [facility]) 2011   Dr. Trudie Reed  . Obesity   . Seasonal allergies   . Seizure in childhood [facility])    as a child no treatment none x 30 years   Patient Active Problem List   Diagnosis Date Noted  . Essential hypertension 04/29/2016  . Lupus (systemic lupus erythematosus) ([place]) 04/29/2016  . Asthma 04/29/2016  . Seasonal allergies 04/29/2016  . Abnormal stress test 04/09/2016   Home Medication(s) Prior to Admission medications   Medication Sig Start Date End Date  Taking? Authorizing Provider  albuterol (PROVENTIL HFA;VENTOLIN HFA) 108 (90 Base) MCG/ACT inhaler Inhale 2 puffs into the lungs every 6 (six) hours as needed for wheezing or shortness of breath. 04/29/16   Harrison Mons, PA  aspirin 81 MG tablet Take 81 mg by mouth daily.    [provider]  aspirin-acetaminophen-caffeine (EXCEDRIN MIGRAINE) [phone] MG tablet Take 2 tablets by mouth every 6 (six) hours as needed for headache.    [provider]  Bepotastine Besilate (BEPREVE) 1.5 % SOLN Place 1 drop into both eyes 2 (two) times daily as needed (DRY EYES).    [provider]  Cholecalciferol (VITAMIN D) 2000 units CAPS Take 4,000 Units by mouth daily.    [provider]  Dietary Management Product (RHEUMATE) CAPS Take 1 capsule by mouth daily. [place] FROM DR    [provider]  DIPROLENE 0.05 % ointment Apply 1 application topically as needed (SKIN IRRITATION).  04/08/15   [provider]  ferrous sulfate 325 (65 FE) MG tablet Take 325 mg by mouth daily with breakfast.    [provider]  Flaxseed, Linseed, (FLAXSEED OIL) 1000 MG CAPS Take 1,000 mg by mouth daily.    [provider]  Fluocinolone Acetonide Scalp 0.01 % OIL Apply 1 application topically daily as needed for itching. 01/13/16   [provider]  fluticasone (FLONASE) 50 MCG/ACT nasal spray Place 2 sprays into  both nostrils daily. 04/29/16   Harrison Mons, PA  hydroxychloroquine (PLAQUENIL) 200 MG tablet TAKE 2 TABLETS BY MOUTH ONCE A DAY WITH FOOD OR MILK 06/17/14   [provider]  losartan-hydrochlorothiazide (HYZAAR) 50-12.5 MG tablet Take by mouth.    [provider]  methotrexate 50 MG/2ML injection INJECT 0.8 ML ONCE WEEKLY OR AS DIRECTED On SUNDAY 06/17/14   [provider]  methylPREDNISolone (MEDROL DOSEPAK) 4 MG TBPK tablet follow package directions 02/26/19   [provider]  metoprolol succinate  (TOPROL-XL) 25 MG 24 hr tablet Take 25 mg by mouth daily. 02/20/16   [provider]  oxyCODONE-acetaminophen (PERCOCET/ROXICET) 5-325 MG per tablet Take 1 tablet by mouth every 6 (six) hours as needed for moderate pain or severe pain.     [provider]  rizatriptan (MAXALT) 10 MG tablet 1 TABLET AS NEEDED ONCE A DAY FOR MIGRAINE ORALLY 30 DAYS 02/15/19   [provider]                                                                                                                                    Past Surgical History Past Surgical History:  Procedure Laterality Date  . ABDOMINAL HYSTERECTOMY    . BACK SURGERY  2009  . COLONOSCOPY WITH PROPOFOL N/A 04/11/2012   Procedure: COLONOSCOPY WITH PROPOFOL;  Surgeon: Garlan Fair, MD;  Location: [facility];  Service: Endoscopy;  Laterality: N/A;  . HERNIA REPAIR     as child unbilical  . LEFT HEART CATH AND CORONARY ANGIOGRAPHY N/A 04/09/2016   Procedure: Left Heart Cath and Coronary Angiography;  Surgeon: Nelva Bush, MD;  Location: [facility];  Service: Cardiovascular;  Laterality: N/A;  . MOUTH SURGERY     Family History Family History  Problem Relation Age of Onset  . Asthma Mother   . Diabetes Mother   . Heart attack Father 40  . Stroke Maternal Aunt   . Stroke Paternal Aunt   . Stroke Paternal Uncle     Social History Social History   Tobacco Use  . Smoking status: Never Smoker  . Smokeless tobacco: Never Used  Substance Use Topics  . Alcohol use: No  . Drug use: No   Allergies Cinnamon, Other, Peanut-containing drug products, Corn oil, and Corn-containing products  Review of Systems Review of Systems All other systems are reviewed and are negative for acute change except as noted in the HPI  Physical Exam Vital Signs  I have reviewed the triage vital signs BP (!) 148/86   Pulse 84   Temp 98.5 F (36.9 C)   Resp 16   SpO2 100%   Physical Exam Vitals reviewed.    Constitutional:      General: She is not in acute distress.    Appearance: She is well-developed. She is not diaphoretic.  HENT:     Head: Normocephalic and atraumatic.  Nose: Nose normal.  Eyes:     General: No scleral icterus.       Right eye: No discharge.        Left eye: No discharge.     Conjunctiva/sclera: Conjunctivae normal.     Pupils: Pupils are equal, round, and reactive to light.  Cardiovascular:     Rate and Rhythm: Normal rate and regular rhythm.     Heart sounds: No murmur. No friction rub. No gallop.   Pulmonary:     Effort: Pulmonary effort is normal. No respiratory distress.     Breath sounds: Normal breath sounds. No stridor. No rales.  Abdominal:     General: There is no distension.     Palpations: Abdomen is soft.     Tenderness: There is no abdominal tenderness.  Musculoskeletal:        General: No tenderness.     Cervical back: Normal range of motion and neck supple.  Skin:    General: Skin is warm and dry.     Findings: No erythema or rash.  Neurological:     Mental Status: She is alert and oriented to person, place, and time.     Comments: Mental Status:  Alert and oriented to person, place, and time.  Attention and concentration normal.  Speech clear.  Recent memory is intact  Cranial Nerves:  II Visual Fields: Intact to confrontation. Visual fields intact. III, IV, VI: Pupils equal and reactive to light and near. Full eye movement without nystagmus  V Facial Sensation: Normal. No weakness of masticatory muscles  VII: No facial weakness or asymmetry  VIII Auditory Acuity: Grossly normal  IX/X: The uvula is midline; the palate elevates symmetrically  XI: Normal sternocleidomastoid and trapezius strength  XII: The tongue is midline. No atrophy or fasciculations.   Motor System: Muscle Strength: 5/5 and symmetric in the upper and lower extremities. No pronation or drift.  Muscle Tone: Tone and muscle bulk are normal in the upper and lower  extremities.   Reflexes: DTRs: 1+ and symmetrical in all four extremities. No Clonus Coordination: Intact finger-to-nose. No tremor.  Sensation: Intact to light touch Gait normal.      ED Results and Treatments Labs (all labs ordered are listed, but only abnormal results are displayed) Labs Reviewed - No data to display                                                                                                                       EKG  EKG Interpretation  Date/Time:    Ventricular Rate:    PR Interval:    QRS Duration:   QT Interval:    QTC Calculation:   R Axis:     Text Interpretation:        Radiology No results found.  Pertinent labs & imaging results that were available during my care of the patient were reviewed by me and considered in my medical decision making (see chart for details).  Medications Ordered  in ED Medications  diphenhydrAMINE (BENADRYL) injection 25 mg (25 mg Intravenous Given 04/09/19 0627)  dexamethasone (DECADRON) injection 10 mg (10 mg Intravenous Given 04/09/19 [id number])  prochlorperazine (COMPAZINE) injection 10 mg (10 mg Intravenous Given 04/09/19 [id number])  acetaminophen (TYLENOL) tablet 1,000 mg (1,000 mg Oral Given 04/09/19 0626)  sodium chloride 0.9 % bolus 1,000 mL (1,000 mLs Intravenous New Bag/Given 04/09/19 0631)                                                                                                                                    Procedures Procedures  (including critical care time)  Medical Decision Making / ED Course I have reviewed the nursing notes for this encounter and the patient's prior records (if available in EHR or on provided paperwork).   Dorota Zyonna Stankovic was evaluated in Emergency Department on 04/09/2019 for the symptoms described in the history of present illness. She was evaluated in the context of the global COVID-19 pandemic, which necessitated consideration that the patient might be at risk for infection  with the SARS-CoV-2 virus that causes COVID-19. Institutional protocols and algorithms that pertain to the evaluation of patients at risk for COVID-19 are in a state of rapid change based on information released by regulatory bodies including the CDC and federal and state organizations. These policies and algorithms were followed during the patient's care in the ED.  Typical migraine headache for the pt. Non focal neuro exam. No recent head trauma. No fever. Doubt meningitis. Doubt intracranial bleed. Doubt IIH. No indication for imaging.   Will treat with migraine cocktail and reevaluate.  Pain improved.        Final Clinical Impression(s) / ED Diagnoses Final diagnoses:  Migraine with aura and without status migrainosus, not intractable    The patient appears reasonably screened and/or stabilized for discharge and I doubt any other medical condition or other [facility] requiring further screening, evaluation, or treatment in the ED at this time prior to discharge. Safe for discharge with strict return precautions.  Disposition: Discharge  Condition: Good  I have discussed the results, Dx and Tx plan with the patient/family who expressed understanding and agree(s) with the plan. Discharge instructions discussed at length. The patient/family was given strict return precautions who verbalized understanding of the instructions. No further questions at time of discharge.    ED Discharge Orders    None       Follow Up: Josetta Huddle, MD [address] [place] [address] [phone]   As needed     This chart was dictated using voice recognition software.  Despite best efforts to proofread,  errors can occur which can change the documentation meaning.   Fatima Blank, MD 04/09/19 [phone]

## 2019-04-09 NOTE — Telephone Encounter (Signed)
Pt called regarding Rx for migraines.  RNCM reviewed chart and did not find that Rx was printed or faxed.  RNCM left secure chat for EDP.

## 2019-04-18 ENCOUNTER — Ambulatory Visit: Payer: BLUE CROSS/BLUE SHIELD | Admitting: Neurology

## 2019-04-18 NOTE — Progress Notes (Signed)
[id number]    Provider:  Dr Jaynee Eagles Requesting Provider: Josetta Huddle, MD Primary Care Provider:  Josetta Huddle, MD  CC:  migraines  HPI:  Cynthia Frye is a 59 y.o. female here as requested by Josetta Huddle, MD for migraines. PMHx dizziness, disorders of the eustachian tube, other pancytopenia, cervicalgia, hypertension, disorders of the facial nerve, cervical radiculopathy, myalgia, lightheadedness, thyroid disease, sciatica, obesity, hypersomnia, situational stress, migraine aura persistent, systemic sclerosis, migraine, syncope and collapse.  I reviewed Dr. Inda Merlin notes, migraines have been worsening, possibly due to discontinuation of her nonsteroidal anti-inflammatory medication which was diclofenac, she was taking that chronically twice daily, she was also on a steroid taper for carpal tunnel syndrome, trialed Maxalt, added topiramate at night, and increased metoprolol and referred her to [place] for evaluation.  She reported severe left-sided throbbing, she has a visual aura scotoma followed by pounding retro-orbital headaches.  She had migraines in the past but only with triggers in the past every month, in January she had her first migraine of the year and she couldn't go to work, she had another one in February, she is seeing her allergy doctor to discuss foods and triggers. Since February she is having them more, Its starts over the eyes unilaterally but spreads and even sensitive to the touch skin on her scalp, she sees heat waves as her aura, if she can feel it coming on and she takes something and gets in a quiet room she may catch it. Associated photophobia/phonophobia, severe pain and can last 4-24, pulsing/pounding/throbbing. She has been out of work for 2 days with a migraine, she is having them every other day now. She went to the ED it was so bad. She does not wake up with them, not positional, no changes in symptoms. These are her normal migraines just  happening more often.   Reviewed notes, labs and imaging from outside physicians, which showed:  Reviewed MRI of the brain images which were negative for acute pathology, agree with the following:  MRI 04/22/2019: Brain: There is no evidence of acute infarct, intracranial hemorrhage, mass, midline shift, or extra-axial fluid collection. The ventricles and sulci are normal. The brain is normal in signal. No abnormal enhancement is identified.  Vascular: Major intracranial vascular flow voids are preserved.  Skull and upper cervical spine: No suspicious marrow lesion.  Sinuses/Orbits: Unremarkable orbits. Minimal mucosal thickening inferiorly in the maxillary sinuses. Clear mastoid air cells.  Other: None.  IMPRESSION: Unremarkable appearance of the brain  meds tried topamax, metoprolol  BMP normal 2020  Review of Systems: Patient complains of symptoms per HPI as well as the following symptoms: headache, seizure, weight gain, joint pain. Pertinent negatives and positives per HPI. All others negative.   Social History   Socioeconomic History  . Marital status: Married    Spouse name: Not on file  . Number of children: 0  . Years of education: Not on file  . Highest education level: Some college, no degree  Occupational History  . Not on file  Tobacco Use  . Smoking status: Never Smoker  . Smokeless tobacco: Never Used  Substance and Sexual Activity  . Alcohol use: No  . Drug use: No  . Sexual activity: Not on file  Other Topics Concern  . 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 Difficulty of Paying Living Expenses:   Food Insecurity:   . Worried About Charity fundraiser in the Last  Year:   . Arboriculturist in the Last Year:   Transportation Needs:   . Film/video editor (Medical):   Marland Kitchen Lack of Transportation (Non-Medical):   Physical Activity:   . Days of Exercise per Week:   . Minutes of Exercise per Session:   Stress:   . Feeling of Stress :   Social Connections:   . Frequency of Communication with Friends and Family:   . Frequency of Social Gatherings with Friends and Family:   . Attends Religious Services:   . Active Member of Clubs or Organizations:   . Attends Archivist Meetings:   Marland Kitchen Marital Status:   Intimate Partner Violence:   . Fear of Current or Ex-Partner:   . Emotionally Abused:   Marland Kitchen Physically Abused:   . Sexually Abused:     Family History  Problem Relation Age of Onset  . Asthma Mother   . Diabetes Mother   . Hypertension Mother   . Heart attack Father 73  . CAD Father   . Stroke Maternal Aunt   . Stroke Paternal Aunt   . Stroke Paternal Uncle   . Gout Other        uncles   . Ovarian cancer Neg Hx   . Breast cancer Neg Hx   . Colon cancer Neg Hx   . Migraines Neg Hx     Past Medical History:  Diagnosis Date  . Allergies    peanuts, corn, beans, red grapefruit, naval oranges  . Asthma    allergy shots and medication  . DDD (degenerative disc disease), lumbar   . Eustachian tube dysfunction 12/2012   rhinitis, vertigo- Dr. Wilburn Cornelia, ENT   . Fibroids   . Heart murmur    Echo 1/18: EF 55-60, no RWMA, normal diastolic function, trivial AI, PASP 32  . History of cardiac catheterization    LHC 3/18: normal coronary arteries.   . History of nuclear stress test    ETT-Myoview 2/18: EF 62, + ECG response; apical and distal septal ischemia; intermediate risk.  Marland Kitchen Hypertension   . Iron deficiency anemia   . Lupus [facility]) 2011   Dr. Trudie Reed  . Migraine headache    trial of generic maxalt 10 mg, January 2021  . Obesity   . Seasonal allergies   . Seizure in childhood [facility]    as a child no treatment none x 30 years      Patient Active Problem List   Diagnosis Date Noted  . Chronic migraine without aura without status migrainosus, not intractable 04/23/2019  . Migraine with aura and without status migrainosus, not intractable 04/23/2019  . Essential hypertension 04/29/2016  . Lupus (systemic lupus erythematosus) ([place]) 04/29/2016  . Asthma 04/29/2016  . Seasonal allergies 04/29/2016  . Abnormal stress test 04/09/2016    Past Surgical History:  Procedure Laterality Date  . ABDOMINAL HYSTERECTOMY    . BACK SURGERY  2009  . COLONOSCOPY WITH PROPOFOL N/A 04/11/2012   Procedure: COLONOSCOPY WITH PROPOFOL;  Surgeon: Garlan Fair, MD;  Location: [facility];  Service: Endoscopy;  Laterality: N/A;  . HERNIA REPAIR     as child unbilical  . 0000000  partial discectomy and laminectomy     Dr. Christella Noa  . LEFT HEART CATH AND CORONARY ANGIOGRAPHY N/A 04/09/2016   Procedure: Left Heart Cath and Coronary Angiography;  Surgeon: Nelva Bush, MD;  Location: [facility];  Service: Cardiovascular;  Laterality: N/A;  . MOUTH SURGERY      Current Outpatient Medications  Medication Sig Dispense Refill  . albuterol (PROVENTIL HFA;VENTOLIN HFA) 108 (90 Base) MCG/ACT inhaler Inhale 2 puffs into the lungs every 6 (six) hours as needed for wheezing or shortness of breath. 18 g 0  . aspirin 81 MG tablet Take 81 mg by mouth daily.    . Bepotastine Besilate (BEPREVE) 1.5 % SOLN Place 1 drop into both eyes 2 (two) times daily as needed (DRY EYES).    . Cholecalciferol (VITAMIN D) 2000 units CAPS Take 2,000 Units by mouth daily.     . Dietary Management Product (RHEUMATE) CAPS Take 1 capsule by mouth daily. [place]    . DIPROLENE 0.05 % ointment Apply 1 application topically as needed (SKIN IRRITATION).     . ferrous sulfate 325 (65 FE) MG tablet Take 325 mg by mouth daily with breakfast.    . Flaxseed, Linseed, (FLAXSEED OIL) 1000 MG CAPS Take 1,000 mg by mouth daily.    . Fluocinolone  Acetonide Scalp 0.01 % OIL Apply 1 application topically daily as needed for itching.    . fluticasone (FLONASE) 50 MCG/ACT nasal spray Place 2 sprays into both nostrils daily. 16 g 12  . hydroxychloroquine (PLAQUENIL) 200 MG tablet Take 400 mg by mouth daily.   5  . Ibuprofen (ADVIL PO) Take 400-600 mg by mouth every 8 (eight) hours as needed (pain/headache).     . losartan-hydrochlorothiazide (HYZAAR) 50-12.5 MG tablet Take 1 tablet by mouth daily.     . methotrexate 50 MG/2ML injection Inject 25 mg into the skin once a week. 74mL = 25mg   3  . metoprolol succinate (TOPROL-XL) 50 MG 24 hr tablet Take 50 mg by mouth daily. Take with or immediately following a meal.    . rizatriptan (MAXALT-MLT) 10 MG disintegrating tablet Take 1 tablet (10 mg total) by mouth as needed for migraine. May repeat in 2 hours if needed 9 tablet 11  . topiramate (TOPAMAX) 50 MG tablet Take 1 tablet (50 mg total) by mouth at bedtime. 30 tablet 6   No current facility-administered medications for this visit.    Allergies as of 04/19/2019 - Review Complete 04/19/2019  Allergen Reaction Noted  . Cinnamon Other (See Comments) 04/07/2016  . Peanut-containing drug products Hives 04/07/2016  . Corn oil Hives 04/07/2016  . Corn-containing products Hives 04/07/2016  . Other Rash 04/07/2016    Vitals: BP 129/84 (BP Location: Right Arm, Patient Position: Sitting)   Pulse 75   Temp 97.8 F (36.6 C) Comment: taken at front  Ht 5\' 7"  (1.702 m)   Wt 224 lb (101.6 kg)   BMI 35.08 kg/m  Last Weight:  Wt Readings from Last 1 Encounters:  04/22/19 219 lb (99.3 kg)   Last Height:   Ht Readings from Last 1 Encounters:  04/22/19 5\' 7"  (1.702 m)     Physical exam: Exam: Gen: NAD, conversant, well nourised, obese, well groomed                     CV: RRR, no MRG. No Carotid Bruits. No peripheral edema, warm, nontender Eyes: Conjunctivae clear without exudates or hemorrhage  Neuro:  Detailed Neurologic Exam  Speech:     Speech is normal; fluent and spontaneous with normal comprehension.  Cognition:    The patient is oriented to person, place, and time;     recent and remote memory intact;     language fluent;     normal attention, concentration,     fund of knowledge Cranial Nerves:    The pupils are equal, round, and reactive to light. The fundi are normal and spontaneous venous pulsations are present. Visual fields are full to finger confrontation. Extraocular movements are intact. Trigeminal sensation is intact and the muscles of mastication are normal. Left ptosis(chronic) otherwise face is symmetric. The palate elevates in the midline. Hearing intact. Voice is normal. Shoulder shrug is normal. The tongue has normal motion without fasciculations.   Coordination:    No dysmetria  Gait:    Normal native gait  Motor Observation:    No asymmetry, no atrophy, and no involuntary movements noted. Tone:    Normal muscle tone.    Posture:    Posture is normal. normal erect    Strength:    Strength is V/V in the upper and lower limbs.      Sensation: intact to LT     Reflex Exam:  DTR's:    Deep tendon reflexes in the upper and lower extremities are symmetrical bilaterally.   Toes:    The toes are equivocalbilaterally.   Clonus:    Clonus is absent.    Assessment/Plan:  59 y.o. female here as requested by Josetta Huddle, MD for migraines. PMHx dizziness, disorders of the eustachian tube, other pancytopenia, cervicalgia, hypertension, disorders of the facial nerve, cervical radiculopathy, myalgia, lightheadedness, thyroid disease, sciatica, obesity, hypersomnia, situational stress, migraine aura persistent, systemic sclerosis, migraine, syncope and collapse. We discussed multiple options, oral medications, botox and monthly injections. Ajovy is probably the quickest in onset of the preventatives, MRI was negative, will start Ajovy.  - Worsening migraines, she has been out of work and been to the  ED. At this time will try:   - Maxalt acutely:Please take one tablet at the onset of your headache. If it does not improve the symptoms please take one additional tablet. Do not take more then 2 tablets in 24hrs. Do not take use more then 2 to 3 times in a week. Also discussed risk of stroke in migraine with aura.  - Increase Topamax. Ajovy monthly if Topamax does not help, we can also increase it: Preventative  Discussed: To prevent or relieve headaches, try the following: Cool Compress. Lie down and place a cool compress on your head.  Avoid headache triggers. If certain foods or odors seem to have triggered your migraines in the past, avoid them. A headache diary might help you identify triggers.  Include physical activity in your daily routine. Try a daily walk or other moderate aerobic exercise.  Manage stress. Find healthy ways to cope with the stressors, such as delegating tasks on your to-do list.  Practice relaxation techniques. Try deep breathing, yoga, massage and visualization.  Eat regularly. Eating regularly scheduled meals and maintaining a healthy diet might help prevent headaches. Also, drink plenty of fluids.  Follow a regular sleep schedule. Sleep deprivation might contribute to headaches Consider biofeedback. With this mind-body technique, you learn to control certain bodily functions -- such as muscle tension, heart rate and blood pressure -- to prevent headaches or reduce headache pain.    Proceed to emergency room if you experience new or worsening  symptoms or symptoms do not resolve, if you have new neurologic symptoms or if headache is severe, or for any concerning symptom.   Provided education and documentation from American headache Society toolbox including articles on: chronic migraine medication overuse headache, chronic migraines, prevention of migraines, behavioral and other nonpharmacologic treatments for headache.   Orders Placed This Encounter  Procedures  .  Basic Metabolic Panel   Meds ordered this encounter  Medications  . rizatriptan (MAXALT-MLT) 10 MG disintegrating tablet    Sig: Take 1 tablet (10 mg total) by mouth as needed for migraine. May repeat in 2 hours if needed    Dispense:  9 tablet    Refill:  11  . topiramate (TOPAMAX) 50 MG tablet    Sig: Take 1 tablet (50 mg total) by mouth at bedtime.    Dispense:  30 tablet    Refill:  6    Cc: Josetta Huddle, MD,    Sarina Ill, MD  [facility] [address] [place],  [address]  Phone [phone] Fax [phone]

## 2019-04-19 ENCOUNTER — Ambulatory Visit (INDEPENDENT_AMBULATORY_CARE_PROVIDER_SITE_OTHER): Payer: BC Managed Care – PPO | Admitting: Neurology

## 2019-04-19 ENCOUNTER — Encounter: Payer: Self-pay | Admitting: Neurology

## 2019-04-19 ENCOUNTER — Other Ambulatory Visit: Payer: Self-pay

## 2019-04-19 VITALS — BP 129/84 | HR 75 | Temp 97.8°F | Ht 67.0 in | Wt 224.0 lb

## 2019-04-19 DIAGNOSIS — G43709 Chronic migraine without aura, not intractable, without status migrainosus: Secondary | ICD-10-CM | POA: Diagnosis not present

## 2019-04-19 DIAGNOSIS — G43109 Migraine with aura, not intractable, without status migrainosus: Secondary | ICD-10-CM | POA: Diagnosis not present

## 2019-04-19 MED ORDER — RIZATRIPTAN BENZOATE 10 MG PO TBDP: 10.0000 mg | ORAL_TABLET | ORAL | 11 refills | Status: DC | PRN

## 2019-04-19 MED ORDER — TOPIRAMATE 50 MG PO TABS: 50.0000 mg | ORAL_TABLET | Freq: Every day | ORAL | 6 refills | Status: DC

## 2019-04-19 NOTE — Patient Instructions (Signed)
Topamax at bedtime(Topiramate) Rizatriptan: Please take one tablet at the onset of your headache. If it does not improve the symptoms please take one additional tablet. Do not take more then 2 tablets in 24hrs. Do not take use more then 2 to 3 times in a week.  Rizatriptan tablets What is this medicine? RIZATRIPTAN (rye za TRIP tan) is used to treat migraines with or without aura. An aura is a strange feeling or visual disturbance that warns you of an attack. It is not used to prevent migraines. This medicine may be used for other purposes; ask your health care provider or pharmacist if you have questions. COMMON BRAND NAME(S): Maxalt What should I tell my health care provider before I take this medicine? They need to know if you have any of these conditions:  cigarette smoker  circulation problems in fingers and toes  diabetes  heart disease  high blood pressure  high cholesterol  history of irregular heartbeat  history of stroke  kidney disease  liver disease  stomach or intestine problems  an unusual or allergic reaction to rizatriptan, other medicines, foods, dyes, or preservatives  pregnant or trying to get pregnant  breast-feeding How should I use this medicine? Take this medicine by mouth with a glass of water. Follow the directions on the prescription label. Do not take it more often than directed. Talk to your pediatrician regarding the use of this medicine in children. While this drug may be prescribed for children as young as 6 years for selected conditions, precautions do apply. Overdosage: If you think you have taken too much of this medicine contact a poison control center or emergency room at once. NOTE: This medicine is only for you. Do not share this medicine with others. What if I miss a dose? This does not apply. This medicine is not for regular use. What may interact with this medicine? Do not take this medicine with any of the following  medicines:  certain medicines for migraine headache like almotriptan, eletriptan, frovatriptan, naratriptan, rizatriptan, sumatriptan, zolmitriptan  ergot alkaloids like dihydroergotamine, ergonovine, ergotamine, methylergonovine  MAOIs like Carbex, Eldepryl, Marplan, Nardil, and Parnate This medicine may also interact with the following medications:  certain medicines for depression, anxiety, or psychotic disorders  propranolol This list may not describe all possible interactions. Give your health care provider a list of all the medicines, herbs, non-prescription drugs, or dietary supplements you use. Also tell them if you smoke, drink alcohol, or use illegal drugs. Some items may interact with your medicine. What should I watch for while using this medicine? Visit your healthcare professional for regular checks on your progress. Tell your healthcare professional if your symptoms do not start to get better or if they get worse. You may get drowsy or dizzy. Do not drive, use machinery, or do anything that needs mental alertness until you know how this medicine affects you. Do not stand up or sit up quickly, especially if you are an older patient. This reduces the risk of dizzy or fainting spells. Alcohol may interfere with the effect of this medicine. Your mouth may get dry. Chewing sugarless gum or sucking hard candy and drinking plenty of water may help. Contact your healthcare professional if the problem does not go away or is severe. If you take migraine medicines for 10 or more days a month, your migraines may get worse. Keep a diary of headache days and medicine use. Contact your healthcare professional if your migraine attacks occur more frequently. What  side effects may I notice from receiving this medicine? Side effects that you should report to your doctor or health care professional as soon as possible:  allergic reactions like skin rash, itching or hives, swelling of the face, lips,  or tongue  chest pain or chest tightness  signs and symptoms of a dangerous change in heartbeat or heart rhythm like chest pain; dizziness; fast, irregular heartbeat; palpitations; feeling faint or lightheaded; falls; breathing problems  signs and symptoms of a stroke like changes in vision; confusion; trouble speaking or understanding; severe headaches; sudden numbness or weakness of the face, arm or leg; trouble walking; dizziness; loss of balance or coordination  signs and symptoms of serotonin syndrome like irritable; confusion; diarrhea; fast or irregular heartbeat; muscle twitching; stiff muscles; trouble walking; sweating; high fever; seizures; chills; vomiting Side effects that usually do not require medical attention (report to your doctor or health care professional if they continue or are bothersome):  diarrhea  dizziness  drowsiness  dry mouth  headache  nausea, vomiting  pain, tingling, numbness in the hands or feet  stomach pain This list may not describe all possible side effects. Call your doctor for medical advice about side effects. You may report side effects to FDA at 1-800-FDA-1088. Where should I keep my medicine? Keep out of the reach of children. Store at room temperature between 15 and 30 degrees C (59 and 86 degrees F). Keep container tightly closed. Throw away any unused medicine after the expiration date. NOTE: This sheet is a summary. It may not cover all possible information. If you have questions about this medicine, talk to your doctor, pharmacist, or health care provider.  2020 [employer]/Gold Standard (2017-07-26 14:59:59)   Topiramate tablets What is this medicine? TOPIRAMATE (toe PYRE a mate) is used to treat seizures in adults or children with epilepsy. It is also used for the prevention of migraine headaches. This medicine may be used for other purposes; ask your health care provider or pharmacist if you have questions. COMMON BRAND NAME(S):  Topamax, Topiragen What should I tell my health care provider before I take this medicine? They need to know if you have any of these conditions:  bleeding disorders  kidney disease  lung or breathing disease, like asthma  suicidal thoughts, plans, or attempt; a previous suicide attempt by you or a family member  an unusual or allergic reaction to topiramate, other medicines, foods, dyes, or preservatives  pregnant or trying to get pregnant  breast-feeding How should I use this medicine? Take this medicine by mouth with a glass of water. Follow the directions on the prescription label. Do not cut, crush or chew this medicine. Swallow the tablets whole. You can take it with or without food. If it upsets your stomach, take it with food. Take your medicine at regular intervals. Do not take it more often than directed. Do not stop taking except on your doctor's advice. A special MedGuide will be given to you by the pharmacist with each prescription and refill. Be sure to read this information carefully each time. Talk to your pediatrician regarding the use of this medicine in children. While this drug may be prescribed for children as young as 37 years of age for selected conditions, precautions do apply. Overdosage: If you think you have taken too much of this medicine contact a poison control center or emergency room at once. NOTE: This medicine is only for you. Do not share this medicine with others. What if I miss  a dose? If you miss a dose, take it as soon as you can. If your next dose is to be taken in less than 6 hours, then do not take the missed dose. Take the next dose at your regular time. Do not take double or extra doses. What may interact with this medicine? This medicine may interact with the following medications:  acetazolamide  alcohol  antihistamines for allergy, cough, and cold  aspirin and aspirin-like medicines  atropine  birth control pills  certain medicines  for anxiety or sleep  certain medicines for bladder problems like oxybutynin, tolterodine  certain medicines for depression like amitriptyline, fluoxetine, sertraline  certain medicines for seizures like carbamazepine, phenobarbital, phenytoin, primidone, valproic acid, zonisamide  certain medicines for stomach problems like dicyclomine, hyoscyamine  certain medicines for travel sickness like scopolamine  certain medicines for Parkinson's disease like benztropine, trihexyphenidyl  certain medicines that treat or prevent blood clots like warfarin, enoxaparin, dalteparin, apixaban, dabigatran, and rivaroxaban  digoxin  general anesthetics like halothane, isoflurane, methoxyflurane, propofol  hydrochlorothiazide  ipratropium  lithium  medicines that relax muscles for surgery  metformin  narcotic medicines for pain  NSAIDs, medicines for pain and inflammation, like ibuprofen or naproxen  phenothiazines like chlorpromazine, mesoridazine, prochlorperazine, thioridazine  pioglitazone This list may not describe all possible interactions. Give your health care provider a list of all the medicines, herbs, non-prescription drugs, or dietary supplements you use. Also tell them if you smoke, drink alcohol, or use illegal drugs. Some items may interact with your medicine. What should I watch for while using this medicine? Visit your doctor or health care professional for regular checks on your progress. Tell your health care professional if your symptoms do not start to get better or if they get worse. Do not stop taking except on your health care professional's advice. You may develop a severe reaction. Your health care professional will tell you how much medicine to take. Wear a medical ID bracelet or chain. Carry a card that describes your disease and details of your medicine and dosage times. This medicine can reduce the response of your body to heat or cold. Dress warm in cold  weather and stay hydrated in hot weather. If possible, avoid extreme temperatures like saunas, hot tubs, very hot or cold showers, or activities that can cause dehydration such as vigorous exercise. Check with your health care professional if you have severe diarrhea, nausea, and vomiting, or if you sweat a lot. The loss of too much body fluid may make it dangerous for you to take this medicine. You may get drowsy or dizzy. Do not drive, use machinery, or do anything that needs mental alertness until you know how this medicine affects you. Do not stand up or sit up quickly, especially if you are an older patient. This reduces the risk of dizzy or fainting spells. Alcohol may interfere with the effect of this medicine. Avoid alcoholic drinks. Tell your health care professional right away if you have any change in your eyesight. Patients and their families should watch out for new or worsening depression or thoughts of suicide. Also watch out for sudden changes in feelings such as feeling anxious, agitated, panicky, irritable, hostile, aggressive, impulsive, severely restless, overly excited and hyperactive, or not being able to sleep. If this happens, especially at the beginning of treatment or after a change in dose, call your healthcare professional. This medicine may cause serious skin reactions. They can happen weeks to months after starting the medicine.  Contact your health care provider right away if you notice fevers or flu-like symptoms with a rash. The rash may be red or purple and then turn into blisters or peeling of the skin. Or, you might notice a red rash with swelling of the face, lips or lymph nodes in your neck or under your arms. Birth control may not work properly while you are taking this medicine. Talk to your health care professional about using an extra method of birth control. Women should inform their health care professional if they wish to become pregnant or think they might be  pregnant. There is a potential for serious side effects and harm to an unborn child. Talk to your health care professional for more information. What side effects may I notice from receiving this medicine? Side effects that you should report to your doctor or health care professional as soon as possible:  allergic reactions like skin rash, itching or hives, swelling of the face, lips, or tongue  blood in the urine  changes in vision  confusion  loss of memory  pain in lower back or side  pain when urinating  redness, blistering, peeling or loosening of the skin, including inside the mouth  signs and symptoms of bleeding such as bloody or black, tarry stools; red or dark brown urine; spitting up blood or brown material that looks like coffee grounds; red spots on the skin; unusual bruising or bleeding from the eyes, gums, or nose  signs and symptoms of increased acid in the body like breathing fast; fast heartbeat; headache; confusion; unusually weak or tired; nausea, vomiting  suicidal thoughts, mood changes  trouble speaking or understanding  unusual sweating  unusually weak or tired Side effects that usually do not require medical attention (report to your doctor or health care professional if they continue or are bothersome):  dizziness  drowsiness  fever  loss of appetite  nausea, vomiting  pain, tingling, numbness in the hands or feet  stomach pain  tiredness  upset stomach This list may not describe all possible side effects. Call your doctor for medical advice about side effects. You may report side effects to FDA at 1-800-FDA-1088. Where should I keep my medicine? Keep out of the reach of children. Store at room temperature between 15 and 30 degrees C (59 and 86 degrees F). Throw away any unused medicine after the expiration date. NOTE: This sheet is a summary. It may not cover all possible information. If you have questions about this medicine, talk to  your doctor, pharmacist, or health care provider.  2020 [employer]/Gold Standard (2018-08-10 15:07:20)

## 2019-04-22 ENCOUNTER — Encounter (HOSPITAL_COMMUNITY): Payer: Self-pay | Admitting: Emergency Medicine

## 2019-04-22 ENCOUNTER — Emergency Department (HOSPITAL_COMMUNITY): Payer: BC Managed Care – PPO

## 2019-04-22 ENCOUNTER — Emergency Department (HOSPITAL_COMMUNITY)
Admission: EM | Admit: 2019-04-22 | Discharge: 2019-04-22 | Disposition: A | Discharge status: Home / Self Care | Payer: BC Managed Care – PPO | Attending: Emergency Medicine | Admitting: Emergency Medicine

## 2019-04-22 DIAGNOSIS — J45909 Unspecified asthma, uncomplicated: Secondary | ICD-10-CM | POA: Diagnosis not present

## 2019-04-22 DIAGNOSIS — Z79899 Other long term (current) drug therapy: Secondary | ICD-10-CM | POA: Diagnosis not present

## 2019-04-22 DIAGNOSIS — R202 Paresthesia of skin: Secondary | ICD-10-CM | POA: Diagnosis not present

## 2019-04-22 DIAGNOSIS — R9431 Abnormal electrocardiogram [ECG] [EKG]: Secondary | ICD-10-CM | POA: Diagnosis not present

## 2019-04-22 DIAGNOSIS — G459 Transient cerebral ischemic attack, unspecified: Secondary | ICD-10-CM | POA: Diagnosis not present

## 2019-04-22 DIAGNOSIS — I10 Essential (primary) hypertension: Secondary | ICD-10-CM | POA: Diagnosis not present

## 2019-04-22 DIAGNOSIS — Z7982 Long term (current) use of aspirin: Secondary | ICD-10-CM | POA: Diagnosis not present

## 2019-04-22 DIAGNOSIS — R29810 Facial weakness: Secondary | ICD-10-CM | POA: Diagnosis not present

## 2019-04-22 DIAGNOSIS — M3210 Systemic lupus erythematosus, organ or system involvement unspecified: Secondary | ICD-10-CM | POA: Diagnosis not present

## 2019-04-22 DIAGNOSIS — G43909 Migraine, unspecified, not intractable, without status migrainosus: Secondary | ICD-10-CM | POA: Diagnosis not present

## 2019-04-22 DIAGNOSIS — G510 Bell's palsy: Secondary | ICD-10-CM | POA: Diagnosis not present

## 2019-04-22 DIAGNOSIS — R519 Headache, unspecified: Secondary | ICD-10-CM | POA: Diagnosis not present

## 2019-04-22 DIAGNOSIS — Z91010 Allergy to peanuts: Secondary | ICD-10-CM | POA: Insufficient documentation

## 2019-04-22 LAB — COMPREHENSIVE METABOLIC PANEL
ALT: 25 U/L (ref 0–44)
AST: 26 U/L (ref 15–41)
Albumin: 4.8 g/dL (ref 3.5–5.0)
Alkaline Phosphatase: 71 U/L (ref 38–126)
Anion gap: 10 (ref 5–15)
BUN: 17 mg/dL (ref 6–20)
CO2: 26 mmol/L (ref 22–32)
Calcium: 10.4 mg/dL — ABNORMAL HIGH (ref 8.9–10.3)
Chloride: 106 mmol/L (ref 98–111)
Creatinine, Ser: 0.89 mg/dL (ref 0.44–1.00)
GFR calc Af Amer: >60 mL/min (ref >60)
GFR calc non Af Amer: >60 mL/min (ref >60)
Glucose, Bld: 78 mg/dL (ref 70–99)
Potassium: 3.3 mmol/L — ABNORMAL LOW (ref 3.5–5.1)
Sodium: 142 mmol/L (ref 135–145)
Total Bilirubin: 0.7 mg/dL (ref 0.3–1.2)
Total Protein: 8.6 g/dL — ABNORMAL HIGH (ref 6.5–8.1)

## 2019-04-22 LAB — DIFFERENTIAL
Abs Immature Granulocytes: 0 10*3/uL (ref 0.00–0.07)
Basophils Absolute: 0 10*3/uL (ref 0.0–0.1)
Basophils Relative: 1 %
Eosinophils Absolute: 0.2 10*3/uL (ref 0.0–0.5)
Eosinophils Relative: 6 %
Immature Granulocytes: 0 %
Lymphocytes Relative: 29 %
Lymphs Abs: 1 10*3/uL (ref 0.7–4.0)
Monocytes Absolute: 0.4 10*3/uL (ref 0.1–1.0)
Monocytes Relative: 13 %
Neutro Abs: 1.7 10*3/uL (ref 1.7–7.7)
Neutrophils Relative %: 51 %

## 2019-04-22 LAB — URINALYSIS, ROUTINE W REFLEX MICROSCOPIC
Bilirubin Urine: NEGATIVE
Glucose, UA: NEGATIVE mg/dL
Hgb urine dipstick: NEGATIVE
Ketones, ur: NEGATIVE mg/dL
Nitrite: NEGATIVE
Protein, ur: NEGATIVE mg/dL
Specific Gravity, Urine: 1.013 (ref 1.005–1.030)
WBC, UA: >50 WBC/hpf — ABNORMAL HIGH (ref 0–5)
pH: 5 (ref 5.0–8.0)

## 2019-04-22 LAB — CBC
HCT: 39.4 % (ref 36.0–46.0)
Hemoglobin: 13.2 g/dL (ref 12.0–15.0)
MCH: 30.7 pg (ref 26.0–34.0)
MCHC: 33.5 g/dL (ref 30.0–36.0)
MCV: 91.6 fL (ref 80.0–100.0)
Platelets: 197 10*3/uL (ref 150–400)
RBC: 4.3 MIL/uL (ref 3.87–5.11)
RDW: 13.4 % (ref 11.5–15.5)
WBC: 3.3 10*3/uL — ABNORMAL LOW (ref 4.0–10.5)
nRBC: 0 % (ref 0.0–0.2)

## 2019-04-22 LAB — RAPID URINE DRUG SCREEN, HOSP PERFORMED
Amphetamines: NOT DETECTED
Barbiturates: NOT DETECTED
Benzodiazepines: NOT DETECTED
Cocaine: NOT DETECTED
Opiates: NOT DETECTED
Tetrahydrocannabinol: NOT DETECTED

## 2019-04-22 LAB — PROTIME-INR
INR: 1 (ref 0.8–1.2)
Prothrombin Time: 13.1 seconds (ref 11.4–15.2)

## 2019-04-22 LAB — APTT: aPTT: 25 seconds (ref 24–36)

## 2019-04-22 MED ORDER — GADOBUTROL 1 MMOL/ML IV SOLN
10.0000 mL | Freq: Once | INTRAVENOUS | Status: AC | PRN
  Administered 2019-04-22: 10 mL via INTRAVENOUS

## 2019-04-22 MED ORDER — LORAZEPAM 2 MG/ML IJ SOLN
1.0000 mg | Freq: Once | INTRAMUSCULAR | Status: AC | PRN
  Administered 2019-04-22: 1 mg via INTRAVENOUS
  Filled 2019-04-22: qty 1

## 2019-04-22 NOTE — ED Triage Notes (Signed)
Per pt, states she has been having bad migraines since Friday-states she notice her left facial droop this am-went to PCP and they told her to come to ED-obvious droop of left eye

## 2019-04-22 NOTE — ED Provider Notes (Signed)
[facility] DEPT Provider Note   CSN: [id number] Arrival date & time: 04/22/19  1137     History Chief Complaint  Patient presents with  . Facial Droop  . Hypertension  . Migraine    Verita Veeha Backes is a 59 y.o. female.  HPI   59 year old female with history of migraines, lupus, who presents emergency department today for evaluation of left facial droop and headaches.  Patient states she has a long history of migraines which have become more frequent this year.  She noticed yesterday around 6:54 PM that the left side of her face was somewhat droopy.  She also reports an abnormal sensation to the left side of face and states like it looks and feels swollen compared to the other side.  She denies any visual changes.  She denies any new numbness/weakness to the left upper or lower extremity.  Denies any severe dizziness, lightheadedness, difficulty with word finding or slurred speech.  Past Medical History:  Diagnosis Date  . Allergies    peanuts, corn, beans, red grapefruit, naval oranges  . Asthma    allergy shots and medication  . DDD (degenerative disc disease), lumbar   . Eustachian tube dysfunction 12/2012   rhinitis, vertigo- Dr. Wilburn Cornelia, ENT   . Fibroids   . Heart murmur    Echo 1/18: EF 55-60, no RWMA, normal diastolic function, trivial AI, PASP 32  . History of cardiac catheterization    LHC 3/18: normal coronary arteries.   . History of nuclear stress test    ETT-Myoview 2/18: EF 62, + ECG response; apical and distal septal ischemia; intermediate risk.  Marland Kitchen Hypertension   . Iron deficiency anemia   . Lupus [facility]) 2011   Dr. Trudie Reed  . Migraine headache    trial of generic maxalt 10 mg, January 2021  . Obesity   . Seasonal allergies   . Seizure in childhood [facility])    as a child no treatment none x 30 years    Patient Active Problem List   Diagnosis Date Noted  . Essential hypertension 04/29/2016  . Lupus (systemic lupus  erythematosus) ([place]) 04/29/2016  . Asthma 04/29/2016  . Seasonal allergies 04/29/2016  . Abnormal stress test 04/09/2016    Past Surgical History:  Procedure Laterality Date  . ABDOMINAL HYSTERECTOMY    . BACK SURGERY  2009  . COLONOSCOPY WITH PROPOFOL N/A 04/11/2012   Procedure: COLONOSCOPY WITH PROPOFOL;  Surgeon: Garlan Fair, MD;  Location: [facility] END[facility]COPY;  Service: Endoscopy;  Laterality: N/A;  . HERNIA REPAIR     as child unbilical  . 0000000 partial discectomy and laminectomy     Dr. Christella Noa  . LEFT HEART CATH AND CORONARY ANGIOGRAPHY N/A 04/09/2016   Procedure: Left Heart Cath and Coronary Angiography;  Surgeon: Nelva Bush, MD;  Location: [place] CV LAB;  Service: Cardiovascular;  Laterality: N/A;  . MOUTH SURGERY       OB History   No obstetric history on file.     Family History  Problem Relation Age of Onset  . Asthma Mother   . Diabetes Mother   . Hypertension Mother   . Heart attack Father 41  . CAD Father   . Stroke Maternal Aunt   . Stroke Paternal Aunt   . Stroke Paternal Uncle   . Gout Other        uncles   . Ovarian cancer Neg Hx   . Breast cancer Neg Hx   . Colon cancer  Neg Hx   . Migraines Neg Hx     Social History   Tobacco Use  . Smoking status: Never Smoker  . Smokeless tobacco: Never Used  Substance Use Topics  . Alcohol use: No  . Drug use: No    Home Medications Prior to Admission medications   Medication Sig Start Date End Date Taking? Authorizing Provider  albuterol (PROVENTIL HFA;VENTOLIN HFA) 108 (90 Base) MCG/ACT inhaler Inhale 2 puffs into the lungs every 6 (six) hours as needed for wheezing or shortness of breath. 04/29/16   Harrison Mons, PA  aspirin 81 MG tablet Take 81 mg by mouth daily.    [provider]  aspirin-acetaminophen-caffeine (EXCEDRIN MIGRAINE) [id number] MG tablet Take 2 tablets by mouth every 6 (six) hours as needed for headache.    [provider]  Bepotastine Besilate (BEPREVE) 1.5  % SOLN Place 1 drop into both eyes 2 (two) times daily as needed (DRY EYES).    [provider]  Cholecalciferol (VITAMIN D) 2000 units CAPS Take 2,000 Units by mouth daily.     [provider]  Dietary Management Product (RHEUMATE) CAPS Take 1 capsule by mouth daily. [place] FROM DR    [provider]  DIPROLENE 0.05 % ointment Apply 1 application topically as needed (SKIN IRRITATION).  04/08/15   [provider]  ferrous sulfate 325 (65 FE) MG tablet Take 325 mg by mouth daily with breakfast.    [provider]  Flaxseed, Linseed, (FLAXSEED OIL) 1000 MG CAPS Take 1,000 mg by mouth daily.    [provider]  Fluocinolone Acetonide Scalp 0.01 % OIL Apply 1 application topically daily as needed for itching. 01/13/16   [provider]  fluticasone (FLONASE) 50 MCG/ACT nasal spray Place 2 sprays into both nostrils daily. 04/29/16   Harrison Mons, PA  hydroxychloroquine (PLAQUENIL) 200 MG tablet TAKE 2 TABLETS BY MOUTH ONCE A DAY WITH FOOD OR MILK 06/17/14   [provider]  Ibuprofen (ADVIL PO) Take by mouth as needed.    [provider]  losartan-hydrochlorothiazide (HYZAAR) 50-12.5 MG tablet Take by mouth.    [provider]  methotrexate 50 MG/2ML injection 1.0 mL 06/17/14   [provider]  metoprolol succinate (TOPROL-XL) 50 MG 24 hr tablet Take 50 mg by mouth daily. Take with or immediately following a meal.    [provider]  oxyCODONE-acetaminophen (PERCOCET/ROXICET) 5-325 MG per tablet Take 1 tablet by mouth every 6 (six) hours as needed for moderate pain or severe pain.     [provider]  rizatriptan (MAXALT-MLT) 10 MG disintegrating tablet Take 1 tablet (10 mg total) by mouth as needed for migraine. May repeat in 2 hours if needed 04/19/19   Melvenia Beam, MD  topiramate (TOPAMAX) 50 MG tablet Take 1 tablet (50 mg total) by mouth at bedtime. 04/19/19   Melvenia Beam, MD    Allergies    Cinnamon, Peanut-containing drug products, Corn oil, Corn-containing products, and Other  Review of Systems   Review of Systems  Constitutional: Negative for fever.  HENT: Negative for ear pain and sore throat.   Eyes: Negative for visual disturbance.  Respiratory: Negative for cough and shortness of breath.   Cardiovascular: Negative for chest pain and palpitations.  Gastrointestinal: Negative for abdominal pain, constipation, diarrhea, nausea and vomiting.  Genitourinary: Negative for dysuria and hematuria.  Musculoskeletal: Negative for back pain.  Skin: Negative for rash.  Neurological: Positive for headaches (resolved).  Negative for weakness and numbness.       Left facial sensory changes, left facial droop  All other systems reviewed and are negative.   Physical Exam Updated Vital Signs BP (!) 162/83   Pulse 77   Temp 98.2 F (36.8 C) (Oral)   Resp 16   Ht 5\' 7"  (1.702 m)   Wt 99.3 kg   SpO2 100%   BMI 34.30 kg/m   Physical Exam Vitals and nursing note reviewed.  Constitutional:      General: She is not in acute distress.    Appearance: She is well-developed.  HENT:     Head: Normocephalic and atraumatic.  Eyes:     Conjunctiva/sclera: Conjunctivae normal.  Cardiovascular:     Rate and Rhythm: Normal rate and regular rhythm.     Heart sounds: No murmur.  Pulmonary:     Effort: Pulmonary effort is normal. No respiratory distress.     Breath sounds: Normal breath sounds.  Abdominal:     Palpations: Abdomen is soft.     Tenderness: There is no abdominal tenderness.  Musculoskeletal:     Cervical back: Neck supple.  Skin:    General: Skin is warm and dry.  Neurological:     Mental Status: She is alert.     Comments: Mental Status:  Alert, thought content appropriate, able to give a coherent history. Speech fluent without evidence of aphasia. Able to follow 2 step commands without difficulty.  Cranial Nerves:  II: pupils  equal, round, reactive to light III,IV, VI: left ptosis (chronic), extra-ocular motions intact bilaterally  V,VII: smile symmetric, abnormal sensation to the left side of the face VIII: hearing grossly normal to voice  X: uvula elevates symmetrically  XI: bilateral shoulder shrug symmetric and strong XII: midline tongue extension without fassiculations Motor:  Normal tone. 5/5 strength of BUE and BLE major muscle groups including strong and equal grip strength and dorsiflexion/plantar flexion Sensory: light touch normal in all extremities. Cerebellar: normal finger-to-nose with bilateral upper extremities, normal heel to shin Gait: normal gait and balance Negative pronator drift     ED Results / Procedures / Treatments   Labs (all labs ordered are listed, but only abnormal results are displayed) Labs Reviewed  CBC - Abnormal; Notable for the following components:      Result Value   WBC 3.3 (*)    All other components within normal limits  COMPREHENSIVE METABOLIC PANEL - Abnormal; Notable for the following components:   Potassium 3.3 (*)    Calcium 10.4 (*)    Total Protein 8.6 (*)    All other components within normal limits  URINALYSIS, ROUTINE W REFLEX MICROSCOPIC - Abnormal; Notable for the following components:   APPearance HAZY (*)    Leukocytes,Ua LARGE (*)    WBC, UA >50 (*)    Bacteria, UA RARE (*)    Non Squamous Epithelial 0-5 (*)    All other components within normal limits  URINE CULTURE  PROTIME-INR  APTT  DIFFERENTIAL  RAPID URINE DRUG SCREEN, HOSP PERFORMED    EKG EKG Interpretation  Date/Time:  Sunday April 22 2019 12:10:21 EDT Ventricular Rate:  94 PR Interval:    QRS Duration: 90 QT Interval:  365 QTC Calculation: 457 R Axis:   37 Text Interpretation: Sinus rhythm Prolonged PR interval Borderline T abnormalities, anterior leads When compared to prior, no significant chagnes seen. No STEMI Confirmed by Antony Blackbird [phone]) on 04/22/2019 1:12:41  PM   Radiology CT Head Wo  Contrast  Result Date: 04/22/2019 CLINICAL DATA:  Patient with migraine headaches. Left facial droop. EXAM: CT HEAD WITHOUT CONTRAST TECHNIQUE: Contiguous axial images were obtained from the base of the skull through the vertex without intravenous contrast. COMPARISON:  Brain CT 12/06/2012 FINDINGS: Brain: Ventricles and sulci are appropriate for patient's age. No evidence for acute cortically based infarct, intracranial hemorrhage, mass lesion or mass-effect. Vascular: Unremarkable. Skull: Intact. Sinuses/Orbits: Paranasal sinuses are well aerated. Mastoid air cells are unremarkable. Other: None. IMPRESSION: No acute intracranial process. Electronically Signed   By: Lovey Newcomer M.D.   On: 04/22/2019 12:59   MR Brain W and Wo Contrast  Result Date: 04/22/2019 CLINICAL DATA:  Headaches. Left facial droop. EXAM: MRI HEAD WITHOUT AND WITH CONTRAST TECHNIQUE: Multiplanar, multiecho pulse sequences of the brain and surrounding structures were obtained without and with intravenous contrast. CONTRAST:  49mL GADAVIST GADOBUTROL 1 MMOL/ML IV SOLN COMPARISON:  Head CT 04/22/2019 FINDINGS: The study is mildly motion degraded. Brain: There is no evidence of acute infarct, intracranial hemorrhage, mass, midline shift, or extra-axial fluid collection. The ventricles and sulci are normal. The brain is normal in signal. No abnormal enhancement is identified. Vascular: Major intracranial vascular flow voids are preserved. Skull and upper cervical spine: No suspicious marrow lesion. Sinuses/Orbits: Unremarkable orbits. Minimal mucosal thickening inferiorly in the maxillary sinuses. Clear mastoid air cells. Other: None. IMPRESSION: Unremarkable appearance of the brain. Electronically Signed   By: Logan Bores M.D.   On: 04/22/2019 15:33    Procedures Procedures (including critical care time)  Medications Ordered in ED Medications  LORazepam (ATIVAN) injection 1 mg (1 mg Intravenous Given  04/22/19 1447)  gadobutrol (GADAVIST) 1 MMOL/ML injection 10 mL (10 mLs Intravenous Contrast Given 04/22/19 1521)    ED Course  I have reviewed the triage vital signs and the nursing notes.  Pertinent labs & imaging results that were available during my care of the patient were reviewed by me and considered in my medical decision making (see chart for details).    MDM Rules/Calculators/A&P                      59 y/o F presenting with left facial droop and abnormal sensation to the left side of the face  Neuro exam does not reveal any facial droop and no weakness/numbness. Sensory change to the left side of the face. No pronator drift.  Reviewed/interpreted labs CBC with leukopenia which appears chronic, otherwise wnl CMP with mild hypokalemia, otherwise reassuring Coags wnl UA with large leukocytes, 21-50 RBC, > 50 WBCs, rare bacteria. Pt is asymptomatic. Will send culture. UDS neg  CT reviewed/interpreted - no acute intracranial abnormality or bleeding   1:30 PM CONSULT With Dr. Rory Percy with neurology who recommends and MRI w/ and w/o contrast. States that if negative she and f/u as and outpatient with her neurologist. He does not feel that she needs to be admitted for TIA  MRI w/ and w/o contrast -  Normal MRI. No evidence of CVA  Reassessed pt. She is in no distress. Discussed w/u and plan for f/u. She and her husband at bedside voice understanding of the plan and reasons to return. All questions answered, pt stable for d/c.   Final Clinical Impression(s) / ED Diagnoses Final diagnoses:  Paresthesias    Rx / DC Orders ED Discharge Orders    None       Bishop Dublin 04/22/19 1559    Tegeler, Gwenyth Allegra, MD 04/25/19  1437  

## 2019-04-22 NOTE — ED Notes (Signed)
Urine culture sent to lab with urinalysis.  

## 2019-04-22 NOTE — Discharge Instructions (Signed)
CT scan of your head and the MRI of your brain did not show any evidence of a stroke today.  The remainder of your work-up was reassuring.  We are recommending that you follow-up with your neurologist within the next week for reassessment of your symptoms.  Please return the emergency department for any new or worsening symptoms.

## 2019-04-23 ENCOUNTER — Telehealth: Payer: Self-pay | Admitting: Neurology

## 2019-04-23 DIAGNOSIS — G43109 Migraine with aura, not intractable, without status migrainosus: Secondary | ICD-10-CM | POA: Insufficient documentation

## 2019-04-23 DIAGNOSIS — G43709 Chronic migraine without aura, not intractable, without status migrainosus: Secondary | ICD-10-CM | POA: Insufficient documentation

## 2019-04-23 LAB — URINE CULTURE

## 2019-04-23 NOTE — Telephone Encounter (Signed)
Patient was in the emergency room this past weekend. Would you call and see how she is doing and if she needs a follow up with Amy sooner? Also I increased her Topiramate but we also discussed Ajovy, want to know if she is interested in the Ajovy monthly injections?

## 2019-04-23 NOTE — Telephone Encounter (Signed)
Looks like she made an appointment to see you on Wednesday, I just saw her but she was in the emergency room recently so she probably made this appointment. We can further increase her topiramate when she comes in but that does take time to work, we did talk about Ajovy and other options too thanks

## 2019-04-23 NOTE — Telephone Encounter (Signed)
Please call.

## 2019-04-24 DIAGNOSIS — I10 Essential (primary) hypertension: Secondary | ICD-10-CM | POA: Diagnosis not present

## 2019-04-24 DIAGNOSIS — E559 Vitamin D deficiency, unspecified: Secondary | ICD-10-CM | POA: Diagnosis not present

## 2019-04-24 DIAGNOSIS — G43909 Migraine, unspecified, not intractable, without status migrainosus: Secondary | ICD-10-CM | POA: Diagnosis not present

## 2019-04-24 DIAGNOSIS — R300 Dysuria: Secondary | ICD-10-CM | POA: Diagnosis not present

## 2019-04-25 ENCOUNTER — Other Ambulatory Visit: Payer: Self-pay

## 2019-04-25 ENCOUNTER — Encounter: Payer: Self-pay | Admitting: Family Medicine

## 2019-04-25 ENCOUNTER — Ambulatory Visit (INDEPENDENT_AMBULATORY_CARE_PROVIDER_SITE_OTHER): Payer: BC Managed Care – PPO | Admitting: Family Medicine

## 2019-04-25 VITALS — BP 115/79 | HR 79 | Temp 97.9°F | Ht 67.0 in | Wt 218.6 lb

## 2019-04-25 DIAGNOSIS — G43709 Chronic migraine without aura, not intractable, without status migrainosus: Secondary | ICD-10-CM

## 2019-04-25 NOTE — Progress Notes (Addendum)
PATIENT: Cynthia Frye DOB: 11-Aug-1960  REASON FOR VISIT: follow up HISTORY FROM: patient  Chief Complaint  Patient presents with  . Hospitalization Follow-up    Rm 1,alone.  Still with headaches daily.  Very stressed about what is going on,  wants more testing (? aneurysm).  she has not taken maxalt, not sure when to take this.   . Migraine     HISTORY OF PRESENT ILLNESS: Today 04/25/19 Cynthia Frye is a 59 y.o. female here today for follow up of migraines.  She was seen 3 days ago by the ER for concerns of paresthesias of the left side of her face.  She felt as if her left lip was a little droopy.  She has chronic left-sided ptosis.  She denies any other concerning or strokelike symptoms.  Work-up was unremarkable.  She reports a high level anxiety as her family has a strong history of stroke.  She is taking topiramate and tolerating well.  She was seen by PCP yesterday who increased dose to 100 mg at bedtime.  He also increase metoprolol to twice daily.  So far she is tolerating these medications well.  She has follow-up with primary care in 2 weeks.  HISTORY: (copied from Dr Cathren Laine note on 04/19/2019)  HPI:  Cynthia Frye is a 59 y.o. female here as requested by Josetta Huddle, MD for migraines. PMHx dizziness, disorders of the eustachian tube, other pancytopenia, cervicalgia, hypertension, disorders of the facial nerve, cervical radiculopathy, myalgia, lightheadedness, thyroid disease, sciatica, obesity, hypersomnia, situational stress, migraine aura persistent, systemic sclerosis, migraine, syncope and collapse.  I reviewed Dr. Inda Merlin notes, migraines have been worsening, possibly due to discontinuation of her nonsteroidal anti-inflammatory medication which was diclofenac, she was taking that chronically twice daily, she was also on a steroid taper for carpal tunnel syndrome, trialed Maxalt, added topiramate at night, and increased metoprolol and referred her to [place]  for evaluation.  She reported severe left-sided throbbing, she has a visual aura scotoma followed by pounding retro-orbital headaches.  She had migraines in the past but only with triggers in the past every month, in January she had her first migraine of the year and she couldn't go to work, she had another one in February, she is seeing her allergy doctor to discuss foods and triggers. Since February she is having them more, Its starts over the eyes unilaterally but spreads and even sensitive to the touch skin on her scalp, she sees heat waves as her aura, if she can feel it coming on and she takes something and gets in a quiet room she may catch it. Associated photophobia/phonophobia, severe pain and can last 4-24, pulsing/pounding/throbbing. She has been out of work for 2 days with a migraine, she is having them every other day now. She went to the ED it was so bad. She does not wake up with them, not positional, no changes in symptoms. These are her normal migraines just happening more often.   Reviewed notes, labs and imaging from outside physicians, which showed:  Reviewed MRI of the brain images which were negative for acute pathology, agree with the following:  MRI 04/22/2019: Brain: There is no evidence of acute infarct, intracranial hemorrhage, mass, midline shift, or extra-axial fluid collection. The ventricles and sulci are normal. The brain is normal in signal. No abnormal enhancement is identified.  Vascular: Major intracranial vascular flow voids are preserved.  Skull and upper cervical spine: No suspicious marrow lesion.  Sinuses/Orbits: Unremarkable  orbits. Minimal mucosal thickening inferiorly in the maxillary sinuses. Clear mastoid air cells.  Other: None.  IMPRESSION: Unremarkable appearance of the brain  meds tried topamax, metoprolol  BMP normal 2020   REVIEW OF SYSTEMS: Out of a complete 14 system review of symptoms, the patient complains only of the  following symptoms, headaches, numbness, anxiety and all other reviewed systems are negative.  ALLERGIES: Allergies  Allergen Reactions  . Cinnamon Other (See Comments)    Other reaction(s): Other (See Comments) Unknown  On allergy test Unknown  On allergy test  . Peanut-Containing Drug Products Hives  . Corn Oil Hives  . Corn-Containing Products Hives  . Other Rash    Red grapefruit and naval oranges- lips tingling and facial rash    HOME MEDICATIONS: Outpatient Medications Prior to Visit  Medication Sig Dispense Refill  . albuterol (PROVENTIL HFA;VENTOLIN HFA) 108 (90 Base) MCG/ACT inhaler Inhale 2 puffs into the lungs every 6 (six) hours as needed for wheezing or shortness of breath. 18 g 0  . aspirin 81 MG tablet Take 81 mg by mouth daily.    . Bepotastine Besilate (BEPREVE) 1.5 % SOLN Place 1 drop into both eyes 2 (two) times daily as needed (DRY EYES).    . Cholecalciferol (VITAMIN D) 2000 units CAPS Take 2,000 Units by mouth daily.     . Dietary Management Product (RHEUMATE) CAPS Take 1 capsule by mouth daily. [place]    . DIPROLENE 0.05 % ointment Apply 1 application topically as needed (SKIN IRRITATION).     . ferrous sulfate 325 (65 FE) MG tablet Take 325 mg by mouth daily with breakfast.    . Flaxseed, Linseed, (FLAXSEED OIL) 1000 MG CAPS Take 1,000 mg by mouth daily.    . Fluocinolone Acetonide Scalp 0.01 % OIL Apply 1 application topically daily as needed for itching.    . fluticasone (FLONASE) 50 MCG/ACT nasal spray Place 2 sprays into both nostrils daily. 16 g 12  . hydroxychloroquine (PLAQUENIL) 200 MG tablet Take 400 mg by mouth daily.   5  . Ibuprofen (ADVIL PO) Take 400-600 mg by mouth every 8 (eight) hours as needed (pain/headache).     . losartan-hydrochlorothiazide (HYZAAR) 50-12.5 MG tablet Take 1 tablet by mouth daily.     . methotrexate 50 MG/2ML injection Inject 25 mg into the skin once a week. 66mL = 25mg   3  . metoprolol succinate  (TOPROL-XL) 50 MG 24 hr tablet Take 100 mg by mouth daily. Take with or immediately following a meal.     . rizatriptan (MAXALT-MLT) 10 MG disintegrating tablet Take 1 tablet (10 mg total) by mouth as needed for migraine. May repeat in 2 hours if needed 9 tablet 11  . topiramate (TOPAMAX) 50 MG tablet Take 1 tablet (50 mg total) by mouth at bedtime. (Patient taking differently: Take 100 mg by mouth at bedtime. ) 30 tablet 6   No facility-administered medications prior to visit.    PAST MEDICAL HISTORY: Past Medical History:  Diagnosis Date  . Allergies    peanuts, corn, beans, red grapefruit, naval oranges  . Asthma    allergy shots and medication  . DDD (degenerative disc disease), lumbar   . Eustachian tube dysfunction 12/2012   rhinitis, vertigo- Dr. Wilburn Cornelia, ENT   . Fibroids   . Heart murmur    Echo 1/18: EF 55-60, no RWMA, normal diastolic function, trivial AI, PASP 32  . History of cardiac catheterization    LHC  3/18: normal coronary arteries.   . History of nuclear stress test    ETT-Myoview 2/18: EF 62, + ECG response; apical and distal septal ischemia; intermediate risk.  Marland Kitchen Hypertension   . Iron deficiency anemia   . Lupus [facility]) 2011   Dr. Trudie Reed  . Migraine headache    trial of generic maxalt 10 mg, January 2021  . Obesity   . Seasonal allergies   . Seizure in childhood [facility])    as a child no treatment none x 30 years    PAST SURGICAL HISTORY: Past Surgical History:  Procedure Laterality Date  . ABDOMINAL HYSTERECTOMY    . BACK SURGERY  2009  . COLONOSCOPY WITH PROPOFOL N/A 04/11/2012   Procedure: COLONOSCOPY WITH PROPOFOL;  Surgeon: Garlan Fair, MD;  Location: [facility] END[facility];  Service: Endoscopy;  Laterality: N/A;  . HERNIA REPAIR     as child unbilical  . 0000000 partial discectomy and laminectomy     Dr. Christella Noa  . LEFT HEART CATH AND CORONARY ANGIOGRAPHY N/A 04/09/2016   Procedure: Left Heart Cath and Coronary Angiography;  Surgeon: Nelva Bush, MD;   Location: [facility];  Service: Cardiovascular;  Laterality: N/A;  . MOUTH SURGERY      FAMILY HISTORY: Family History  Problem Relation Age of Onset  . Asthma Mother   . Diabetes Mother   . Hypertension Mother   . Heart attack Father 82  . CAD Father   . Stroke Maternal Aunt   . Stroke Paternal Aunt   . Stroke Paternal Uncle   . Gout Other        uncles   . Ovarian cancer Neg Hx   . Breast cancer Neg Hx   . Colon cancer Neg Hx   . Migraines Neg Hx     SOCIAL HISTORY: Social History   Socioeconomic History  . Marital status: Married    Spouse name: Not on file  . Number of children: 0  . Years of education: Not on file  . Highest education level: Some college, no degree  Occupational History  . Not on file  Tobacco Use  . Smoking status: Never Smoker  . Smokeless tobacco: Never Used  Substance and Sexual Activity  . Alcohol use: No  . Drug use: No  . Sexual activity: Not on file  Other Topics Concern  . 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 Difficulty of Paying Living Expenses:   Food Insecurity:   . Worried About Charity fundraiser in the Last Year:   . Arboriculturist in the Last Year:   Transportation Needs:   . Film/video editor (Medical):   Marland Kitchen Lack of Transportation (Non-Medical):   Physical Activity:   . Days of Exercise per Week:   . Minutes of Exercise per Session:   Stress:   . Feeling of Stress :   Social Connections:   . Frequency of Communication with Friends and Family:   . Frequency of Social Gatherings with Friends and Family:   . Attends Religious Services:   . Active Member of Clubs or Organizations:   . Attends Club  or Organization Meetings:   Marland Kitchen  Marital Status:   Intimate Partner Violence:   . Fear of Current or Ex-Partner:   . Emotionally Abused:   Marland Kitchen Physically Abused:   . Sexually Abused:       PHYSICAL EXAM  Vitals:   04/25/19 1445  BP: 115/79  Pulse: 79  Temp: 97.9 F (36.6 C)  Weight: 218 lb 9.6 oz (99.2 kg)  Height: 5\' 7"  (1.702 m)   Body mass index is 34.24 kg/m.  Generalized: Well developed, in no acute distress  Cardiology: normal rate and rhythm, no murmur noted Neurological examination  Mentation: Alert oriented to time, place, history taking. Follows all commands speech and language fluent Cranial nerve II-XII: Pupils were equal round reactive to light. Extraocular movements were full, visual field were full on confrontational test. Facial sensation and strength were normal. Uvula tongue midline. Head turning and shoulder shrug  were normal and symmetric. Motor: The motor testing reveals 5 over 5 strength of all 4 extremities. Good symmetric motor tone is noted throughout.  Sensory: Sensory testing is intact to soft touch on all 4 extremities. No evidence of extinction is noted.  Coordination: Cerebellar testing reveals good finger-nose-finger and heel-to-shin bilaterally.  Gait and station: Gait is normal.    DIAGNOSTIC DATA (LABS, IMAGING, TESTING) - I reviewed patient records, labs, notes, testing and imaging myself where available.  No flowsheet data found.   Lab Results  Component Value Date   WBC 3.3 (L) 04/22/2019   HGB 13.2 04/22/2019   HCT 39.4 04/22/2019   MCV 91.6 04/22/2019   PLT 197 04/22/2019      Component Value Date/Time   NA 142 04/22/2019 1222   NA 142 04/02/2016 1122   K 3.3 (L) 04/22/2019 1222   CL 106 04/22/2019 1222   CO2 26 04/22/2019 1222   GLUCOSE 78 04/22/2019 1222   BUN 17 04/22/2019 1222   BUN 10 04/02/2016 1122   CREATININE 0.89 04/22/2019 1222   CALCIUM 10.4 (H) 04/22/2019 1222   PROT 8.6 (H) 04/22/2019 1222   ALBUMIN 4.8 04/22/2019 1222   AST 26  04/22/2019 1222   ALT 25 04/22/2019 1222   ALKPHOS 71 04/22/2019 1222   BILITOT 0.7 04/22/2019 1222   GFRNONAA >60 04/22/2019 1222   GFRAA >60 04/22/2019 1222   No results found for: CHOL, HDL, LDLCALC, LDLDIRECT, TRIG, CHOLHDL No results found for: HGBA1C No results found for: VITAMINB12 No results found for: TSH     ASSESSMENT AND PLAN 59 y.o. year old female  has a past medical history of Allergies, Asthma, DDD (degenerative disc disease), lumbar, Eustachian tube dysfunction (12/2012), Fibroids, Heart murmur, History of cardiac catheterization, History of nuclear stress test, Hypertension, Iron deficiency anemia, Lupus ([place]) (2011), Migraine headache, Obesity, Seasonal allergies, and Seizure in childhood ([place]). here with    ICD-10-CM   1. Chronic migraine without aura without status migrainosus, not intractable  G43.709     Mrs Mcaloon presents today with concerns of unusual sensation of the left side of her lip 3 days ago.  Fortunately, ER evaluation was unremarkable.  She expresses to me that she is very anxious as her family has a history of stroke.  She is concerned that migraines may have been contributed to a previous stroke or aneurysm.  I reviewed ER documentation as well as imaging with her in office.  I have reassured her that we do not see any intracranial abnormalities at this time.  She has no other stroke  like symptoms.  Neuro exam is intact.  I encouraged her to continue with PCP recommendation of increasing topiramate to 100 mg at bedtime.  She will also continue metoprolol twice daily as advised by PCP.  She has not yet use rizatriptan but may do so in the event of a bad migraine.  She was encouraged to keep a close eye on her blood pressures.  She will follow-up closely with primary care for stroke prevention.  Healthy, well-balanced diet and regular exercise advised.  Adequate hydration discussed.  I have given her educational materials in reference to migraines as well as  stroke prevention.  She was also encouraged to speak with primary care for any concerns of anxiety or depression that could be contributing to headaches.  She will follow-up with me in 3 months, sooner if needed.  She verbalizes understanding and agreement with this plan.   No orders of the defined types were placed in this encounter.    No orders of the defined types were placed in this encounter.     I spent 30 minutes with the patient. 50% of this time was spent counseling and educating patient on plan of care and medications.    Debbora Presto, FNP-C 04/25/2019, 3:37 PM [facility] [address] [place], [place] [address] [phone]  Made any corrections needed, and agree with history, physical, neuro exam,assessment and plan as stated.     Sarina Ill, MD [facility]

## 2019-04-25 NOTE — Patient Instructions (Signed)
We will continue topiramate. I agree with PCP recommendation of increasing dose to 100mg  at bedtime. Use rizatriptan is needed for bad migraine.   Continue close follow up with PCP for management of co morbidities.   Follow up with me in 3 months    Migraine Headache A migraine headache is a very strong throbbing pain on one side or both sides of your head. This type of headache can also cause other symptoms. It can last from 4 hours to 3 days. Talk with your doctor about what things may bring on (trigger) this condition. What are the causes? The exact cause of this condition is not known. This condition may be triggered or caused by:  Drinking alcohol.  Smoking.  Taking medicines, such as: ? Medicine used to treat chest pain (nitroglycerin). ? Birth control pills. ? Estrogen. ? Some blood pressure medicines.  Eating or drinking certain products.  Doing physical activity. Other things that may trigger a migraine headache include:  Having a menstrual period.  Pregnancy.  Hunger.  Stress.  Not getting enough sleep or getting too much sleep.  Weather changes.  Tiredness (fatigue). What increases the risk?  Being 28-77 years old.  Being female.  Having a family history of migraine headaches.  Being Caucasian.  Having depression or anxiety.  Being very overweight. What are the signs or symptoms?  A throbbing pain. This pain may: ? Happen in any area of the head, such as on one side or both sides. ? Make it hard to do daily activities. ? Get worse with physical activity. ? Get worse around bright lights or loud noises.  Other symptoms may include: ? Feeling sick to your stomach (nauseous). ? Vomiting. ? Dizziness. ? Being sensitive to bright lights, loud noises, or smells.  Before you get a migraine headache, you may get warning signs (an aura). An aura may include: ? Seeing flashing lights or having blind spots. ? Seeing bright spots, halos, or zigzag  lines. ? Having tunnel vision or blurred vision. ? Having numbness or a tingling feeling. ? Having trouble talking. ? Having weak muscles.  Some people have symptoms after a migraine headache (postdromal phase), such as: ? Tiredness. ? Trouble thinking (concentrating). How is this treated?  Taking medicines that: ? Relieve pain. ? Relieve the feeling of being sick to your stomach. ? Prevent migraine headaches.  Treatment may also include: ? Having acupuncture. ? Avoiding foods that bring on migraine headaches. ? Learning ways to control your body functions (biofeedback). ? Therapy to help you know and deal with negative thoughts (cognitive behavioral therapy). Follow these instructions at home: Medicines  Take over-the-counter and prescription medicines only as told by your doctor.  Ask your doctor if the medicine prescribed to you: ? Requires you to avoid driving or using heavy machinery. ? Can cause trouble pooping (constipation). You may need to take these steps to prevent or treat trouble pooping:  Drink enough fluid to keep your pee (urine) pale yellow.  Take over-the-counter or prescription medicines.  Eat foods that are high in fiber. These include beans, whole grains, and fresh fruits and vegetables.  Limit foods that are high in fat and sugar. These include fried or sweet foods. Lifestyle  Do not drink alcohol.  Do not use any products that contain nicotine or tobacco, such as cigarettes, e-cigarettes, and chewing tobacco. If you need help quitting, ask your doctor.  Get at least 8 hours of sleep every night.  Limit and deal with  stress. General instructions      Keep a journal to find out what may bring on your migraine headaches. For example, write down: ? What you eat and drink. ? How much sleep you get. ? Any change in what you eat or drink. ? Any change in your medicines.  If you have a migraine headache: ? Avoid things that make your symptoms  worse, such as bright lights. ? It may help to lie down in a dark, quiet room. ? Do not drive or use heavy machinery. ? Ask your doctor what activities are safe for you.  Keep all follow-up visits as told by your doctor. This is important. Contact a doctor if:  You get a migraine headache that is different or worse than others you have had.  You have more than 15 headache days in one month. Get help right away if:  Your migraine headache gets very bad.  Your migraine headache lasts longer than 72 hours.  You have a fever.  You have a stiff neck.  You have trouble seeing.  Your muscles feel weak or like you cannot control them.  You start to lose your balance a lot.  You start to have trouble walking.  You pass out (faint).  You have a seizure. Summary  A migraine headache is a very strong throbbing pain on one side or both sides of your head. These headaches can also cause other symptoms.  This condition may be treated with medicines and changes to your lifestyle.  Keep a journal to find out what may bring on your migraine headaches.  Contact a doctor if you get a migraine headache that is different or worse than others you have had.  Contact your doctor if you have more than 15 headache days in a month. This information is not intended to replace advice given to you by your health care provider. Make sure you discuss any questions you have with your health care provider. Document Revised: 05/05/2018 Document Reviewed: 02/23/2018 Elsevier Patient Education  [place].    Stroke Prevention Some medical conditions and lifestyle choices can lead to a higher risk for a stroke. You can help to prevent a stroke by making nutrition, lifestyle, and other changes. What nutrition changes can be made?   Eat healthy foods. ? Choose foods that are high in fiber. These include:  Fresh fruits.  Fresh vegetables.  Whole grains. ? Eat at least 5 or more servings  of fruits and vegetables each day. Try to fill half of your plate at each meal with fruits and vegetables. ? Choose lean protein foods. These include:  Lowfat (lean) cuts of meat.  Chicken without skin.  Fish.  Tofu.  Beans.  Nuts. ? Eat low-fat dairy products. ? Avoid foods that:  Are high in salt (sodium).  Have saturated fat.  Have trans fat.  Have cholesterol.  Are processed.  Are premade.  Follow eating guidelines as told by your doctor. These may include: ? Reducing how many calories you eat and drink each day. ? Limiting how much salt you eat or drink each day to 1,500 milligrams (mg). ? Using only healthy fats for cooking. These include:  Olive oil.  Canola oil.  Sunflower oil. ? Counting how many carbohydrates you eat and drink each day. What lifestyle changes can be made?  Try to stay at a healthy weight. Talk to your doctor about what a good weight is for you.  Get at least 30 minutes  of moderate physical activity at least 5 days a week. This can include: ? Fast walking. ? Biking. ? Swimming.  Do not use any products that have nicotine or tobacco. This includes cigarettes and e-cigarettes. If you need help quitting, ask your doctor. Avoid being around tobacco smoke in general.  Limit how much alcohol you drink to no more than 1 drink a day for nonpregnant women and 2 drinks a day for men. One drink equals 12 oz of beer, 5 oz of wine, or 1 oz of hard liquor.  Do not use drugs.  Avoid taking birth control pills. Talk to your doctor about the risks of taking birth control pills if: ? You are over 37 years old. ? You smoke. ? You get migraines. ? You have had a blood clot. What other changes can be made?  Manage your cholesterol. ? It is important to eat a healthy diet. ? If your cholesterol cannot be managed through your diet, you may also need to take medicines. Take medicines as told by your doctor.  Manage your diabetes. ? It is important  to eat a healthy diet and to exercise regularly. ? If your blood sugar cannot be managed through diet and exercise, you may need to take medicines. Take medicines as told by your doctor.  Control your high blood pressure (hypertension). ? Try to keep your blood pressure below 130/80. This can help lower your risk of stroke. ? It is important to eat a healthy diet and to exercise regularly. ? If your blood pressure cannot be managed through diet and exercise, you may need to take medicines. Take medicines as told by your doctor. ? Ask your doctor if you should check your blood pressure at home. ? Have your blood pressure checked every year. Do this even if your blood pressure is normal.  Talk to your doctor about getting checked for a sleep disorder. Signs of this can include: ? Snoring a lot. ? Feeling very tired.  Take over-the-counter and prescription medicines only as told by your doctor. These may include aspirin or blood thinners (antiplatelets or anticoagulants).  Make sure that any other medical conditions you have are managed. Where to find more information  American Stroke Association: www.strokeassociation.org  National Stroke Association: www.stroke.org Get help right away if:  You have any symptoms of stroke. "BE FAST" is an easy way to remember the main warning signs: ? B - Balance. Signs are dizziness, sudden trouble walking, or loss of balance. ? E - Eyes. Signs are trouble seeing or a sudden change in how you see. ? F - Face. Signs are sudden weakness or loss of feeling of the face, or the face or eyelid drooping on one side. ? A - Arms. Signs are weakness or loss of feeling in an arm. This happens suddenly and usually on one side of the body. ? S - Speech. Signs are sudden trouble speaking, slurred speech, or trouble understanding what people say. ? T - Time. Time to call emergency services. Write down what time symptoms started.  You have other signs of stroke, such  as: ? A sudden, very bad headache with no known cause. ? Feeling sick to your stomach (nausea). ? Throwing up (vomiting). ? Jerky movements you cannot control (seizure). These symptoms may represent a serious problem that is an emergency. Do not wait to see if the symptoms will go away. Get medical help right away. Call your local emergency services (911 in the [place] Do  not drive yourself to the hospital. Summary  You can prevent a stroke by eating healthy, exercising, not smoking, drinking less alcohol, and treating other health problems, such as diabetes, high blood pressure, or high cholesterol.  Do not use any products that contain nicotine or tobacco, such as cigarettes and e-cigarettes.  Get help right away if you have any signs or symptoms of a stroke. This information is not intended to replace advice given to you by your health care provider. Make sure you discuss any questions you have with your health care provider. Document Revised: 03/09/2018 Document Reviewed: 04/14/2016 Elsevier Patient Education  [place].

## 2019-05-08 DIAGNOSIS — J301 Allergic rhinitis due to pollen: Secondary | ICD-10-CM | POA: Diagnosis not present

## 2019-05-08 DIAGNOSIS — Z91010 Allergy to peanuts: Secondary | ICD-10-CM | POA: Diagnosis not present

## 2019-05-08 DIAGNOSIS — J3089 Other allergic rhinitis: Secondary | ICD-10-CM | POA: Diagnosis not present

## 2019-05-08 DIAGNOSIS — J3081 Allergic rhinitis due to animal (cat) (dog) hair and dander: Secondary | ICD-10-CM | POA: Diagnosis not present

## 2019-05-09 DIAGNOSIS — G5622 Lesion of ulnar nerve, left upper limb: Secondary | ICD-10-CM | POA: Diagnosis not present

## 2019-05-09 DIAGNOSIS — M180 Bilateral primary osteoarthritis of first carpometacarpal joints: Secondary | ICD-10-CM | POA: Diagnosis not present

## 2019-05-09 DIAGNOSIS — G5603 Carpal tunnel syndrome, bilateral upper limbs: Secondary | ICD-10-CM | POA: Diagnosis not present

## 2019-05-10 DIAGNOSIS — G508 Other disorders of trigeminal nerve: Secondary | ICD-10-CM | POA: Diagnosis not present

## 2019-05-10 DIAGNOSIS — G43909 Migraine, unspecified, not intractable, without status migrainosus: Secondary | ICD-10-CM | POA: Diagnosis not present

## 2019-05-10 DIAGNOSIS — E559 Vitamin D deficiency, unspecified: Secondary | ICD-10-CM | POA: Diagnosis not present

## 2019-05-10 DIAGNOSIS — I10 Essential (primary) hypertension: Secondary | ICD-10-CM | POA: Diagnosis not present

## 2019-05-15 DIAGNOSIS — Z91010 Allergy to peanuts: Secondary | ICD-10-CM | POA: Diagnosis not present

## 2019-05-23 DIAGNOSIS — R202 Paresthesia of skin: Secondary | ICD-10-CM | POA: Diagnosis not present

## 2019-05-23 DIAGNOSIS — M329 Systemic lupus erythematosus, unspecified: Secondary | ICD-10-CM | POA: Diagnosis not present

## 2019-05-23 DIAGNOSIS — M0600 Rheumatoid arthritis without rheumatoid factor, unspecified site: Secondary | ICD-10-CM | POA: Diagnosis not present

## 2019-05-23 DIAGNOSIS — M2550 Pain in unspecified joint: Secondary | ICD-10-CM | POA: Diagnosis not present

## 2019-05-23 DIAGNOSIS — R519 Headache, unspecified: Secondary | ICD-10-CM | POA: Diagnosis not present

## 2019-06-11 DIAGNOSIS — G5622 Lesion of ulnar nerve, left upper limb: Secondary | ICD-10-CM | POA: Diagnosis not present

## 2019-06-11 DIAGNOSIS — M180 Bilateral primary osteoarthritis of first carpometacarpal joints: Secondary | ICD-10-CM | POA: Diagnosis not present

## 2019-06-11 DIAGNOSIS — G5603 Carpal tunnel syndrome, bilateral upper limbs: Secondary | ICD-10-CM | POA: Diagnosis not present

## 2019-07-09 DIAGNOSIS — E559 Vitamin D deficiency, unspecified: Secondary | ICD-10-CM | POA: Diagnosis not present

## 2019-07-09 DIAGNOSIS — I10 Essential (primary) hypertension: Secondary | ICD-10-CM | POA: Diagnosis not present

## 2019-07-09 DIAGNOSIS — G508 Other disorders of trigeminal nerve: Secondary | ICD-10-CM | POA: Diagnosis not present

## 2019-07-09 DIAGNOSIS — G43909 Migraine, unspecified, not intractable, without status migrainosus: Secondary | ICD-10-CM | POA: Diagnosis not present

## 2019-07-24 ENCOUNTER — Ambulatory Visit (INDEPENDENT_AMBULATORY_CARE_PROVIDER_SITE_OTHER): Payer: BC Managed Care – PPO | Admitting: Family Medicine

## 2019-07-24 ENCOUNTER — Encounter: Payer: Self-pay | Admitting: Family Medicine

## 2019-07-24 ENCOUNTER — Other Ambulatory Visit: Payer: Self-pay

## 2019-07-24 VITALS — BP 136/72 | HR 72 | Ht 70.0 in | Wt 210.0 lb

## 2019-07-24 DIAGNOSIS — G43709 Chronic migraine without aura, not intractable, without status migrainosus: Secondary | ICD-10-CM

## 2019-07-24 MED ORDER — TOPIRAMATE 50 MG PO TABS: ORAL_TABLET | ORAL | 3 refills | Status: DC

## 2019-07-24 NOTE — Patient Instructions (Addendum)
Continue topiramate 75 to 100mg  (1.5 to 2 tablets) daily   Rizatriptan as needed for abortive therapy   Stay well hydrated. Healthy, well balanced diet and regular exercise encouraged.   Follow up in 6 months     Stroke Prevention Some medical conditions and lifestyle choices can lead to a higher risk for a stroke. You can help to prevent a stroke by making nutrition, lifestyle, and other changes. What nutrition changes can be made?   Eat healthy foods. ? Choose foods that are high in fiber. These include:  Fresh fruits.  Fresh vegetables.  Whole grains. ? Eat at least 5 or more servings of fruits and vegetables each day. Try to fill half of your plate at each meal with fruits and vegetables. ? Choose lean protein foods. These include:  Lowfat (lean) cuts of meat.  Chicken without skin.  Fish.  Tofu.  Beans.  Nuts. ? Eat low-fat dairy products. ? Avoid foods that:  Are high in salt (sodium).  Have saturated fat.  Have trans fat.  Have cholesterol.  Are processed.  Are premade.  Follow eating guidelines as told by your doctor. These may include: ? Reducing how many calories you eat and drink each day. ? Limiting how much salt you eat or drink each day to 1,500 milligrams (mg). ? Using only healthy fats for cooking. These include:  Olive oil.  Canola oil.  Sunflower oil. ? Counting how many carbohydrates you eat and drink each day. What lifestyle changes can be made?  Try to stay at a healthy weight. Talk to your doctor about what a good weight is for you.  Get at least 30 minutes of moderate physical activity at least 5 days a week. This can include: ? Fast walking. ? Biking. ? Swimming.  Do not use any products that have nicotine or tobacco. This includes cigarettes and e-cigarettes. If you need help quitting, ask your doctor. Avoid being around tobacco smoke in general.  Limit how much alcohol you drink to no more than 1 drink a day for  nonpregnant women and 2 drinks a day for men. One drink equals 12 oz of beer, 5 oz of wine, or 1 oz of hard liquor.  Do not use drugs.  Avoid taking birth control pills. Talk to your doctor about the risks of taking birth control pills if: ? You are over 12 years old. ? You smoke. ? You get migraines. ? You have had a blood clot. What other changes can be made?  Manage your cholesterol. ? It is important to eat a healthy diet. ? If your cholesterol cannot be managed through your diet, you may also need to take medicines. Take medicines as told by your doctor.  Manage your diabetes. ? It is important to eat a healthy diet and to exercise regularly. ? If your blood sugar cannot be managed through diet and exercise, you may need to take medicines. Take medicines as told by your doctor.  Control your high blood pressure (hypertension). ? Try to keep your blood pressure below 130/80. This can help lower your risk of stroke. ? It is important to eat a healthy diet and to exercise regularly. ? If your blood pressure cannot be managed through diet and exercise, you may need to take medicines. Take medicines as told by your doctor. ? Ask your doctor if you should check your blood pressure at home. ? Have your blood pressure checked every year. Do this even if your blood  pressure is normal.  Talk to your doctor about getting checked for a sleep disorder. Signs of this can include: ? Snoring a lot. ? Feeling very tired.  Take over-the-counter and prescription medicines only as told by your doctor. These may include aspirin or blood thinners (antiplatelets or anticoagulants).  Make sure that any other medical conditions you have are managed. Where to find more information  American Stroke Association: www.strokeassociation.org  National Stroke Association: www.stroke.org Get help right away if:  You have any symptoms of stroke. "BE FAST" is an easy way to remember the main warning  signs: ? B - Balance. Signs are dizziness, sudden trouble walking, or loss of balance. ? E - Eyes. Signs are trouble seeing or a sudden change in how you see. ? F - Face. Signs are sudden weakness or loss of feeling of the face, or the face or eyelid drooping on one side. ? A - Arms. Signs are weakness or loss of feeling in an arm. This happens suddenly and usually on one side of the body. ? S - Speech. Signs are sudden trouble speaking, slurred speech, or trouble understanding what people say. ? T - Time. Time to call emergency services. Write down what time symptoms started.  You have other signs of stroke, such as: ? A sudden, very bad headache with no known cause. ? Feeling sick to your stomach (nausea). ? Throwing up (vomiting). ? Jerky movements you cannot control (seizure). These symptoms may represent a serious problem that is an emergency. Do not wait to see if the symptoms will go away. Get medical help right away. Call your local emergency services (911 in the [place] Do not drive yourself to the hospital. Summary  You can prevent a stroke by eating healthy, exercising, not smoking, drinking less alcohol, and treating other health problems, such as diabetes, high blood pressure, or high cholesterol.  Do not use any products that contain nicotine or tobacco, such as cigarettes and e-cigarettes.  Get help right away if you have any signs or symptoms of a stroke. This information is not intended to replace advice given to you by your health care provider. Make sure you discuss any questions you have with your health care provider. Document Revised: 03/09/2018 Document Reviewed: 04/14/2016 Elsevier Patient Education  [place].    Migraine Headache A migraine headache is a very strong throbbing pain on one side or both sides of your head. This type of headache can also cause other symptoms. It can last from 4 hours to 3 days. Talk with your doctor about what things may  bring on (trigger) this condition. What are the causes? The exact cause of this condition is not known. This condition may be triggered or caused by:  Drinking alcohol.  Smoking.  Taking medicines, such as: ? Medicine used to treat chest pain (nitroglycerin). ? Birth control pills. ? Estrogen. ? Some blood pressure medicines.  Eating or drinking certain products.  Doing physical activity. Other things that may trigger a migraine headache include:  Having a menstrual period.  Pregnancy.  Hunger.  Stress.  Not getting enough sleep or getting too much sleep.  Weather changes.  Tiredness (fatigue). What increases the risk?  Being 35-75 years old.  Being female.  Having a family history of migraine headaches.  Being Caucasian.  Having depression or anxiety.  Being very overweight. What are the signs or symptoms?  A throbbing pain. This pain may: ? Happen in any area of the head,  such as on one side or both sides. ? Make it hard to do daily activities. ? Get worse with physical activity. ? Get worse around bright lights or loud noises.  Other symptoms may include: ? Feeling sick to your stomach (nauseous). ? Vomiting. ? Dizziness. ? Being sensitive to bright lights, loud noises, or smells.  Before you get a migraine headache, you may get warning signs (an aura). An aura may include: ? Seeing flashing lights or having blind spots. ? Seeing bright spots, halos, or zigzag lines. ? Having tunnel vision or blurred vision. ? Having numbness or a tingling feeling. ? Having trouble talking. ? Having weak muscles.  Some people have symptoms after a migraine headache (postdromal phase), such as: ? Tiredness. ? Trouble thinking (concentrating). How is this treated?  Taking medicines that: ? Relieve pain. ? Relieve the feeling of being sick to your stomach. ? Prevent migraine headaches.  Treatment may also include: ? Having acupuncture. ? Avoiding foods  that bring on migraine headaches. ? Learning ways to control your body functions (biofeedback). ? Therapy to help you know and deal with negative thoughts (cognitive behavioral therapy). Follow these instructions at home: Medicines  Take over-the-counter and prescription medicines only as told by your doctor.  Ask your doctor if the medicine prescribed to you: ? Requires you to avoid driving or using heavy machinery. ? Can cause trouble pooping (constipation). You may need to take these steps to prevent or treat trouble pooping:  Drink enough fluid to keep your pee (urine) pale yellow.  Take over-the-counter or prescription medicines.  Eat foods that are high in fiber. These include beans, whole grains, and fresh fruits and vegetables.  Limit foods that are high in fat and sugar. These include fried or sweet foods. Lifestyle  Do not drink alcohol.  Do not use any products that contain nicotine or tobacco, such as cigarettes, e-cigarettes, and chewing tobacco. If you need help quitting, ask your doctor.  Get at least 8 hours of sleep every night.  Limit and deal with stress. General instructions      Keep a journal to find out what may bring on your migraine headaches. For example, write down: ? What you eat and drink. ? How much sleep you get. ? Any change in what you eat or drink. ? Any change in your medicines.  If you have a migraine headache: ? Avoid things that make your symptoms worse, such as bright lights. ? It may help to lie down in a dark, quiet room. ? Do not drive or use heavy machinery. ? Ask your doctor what activities are safe for you.  Keep all follow-up visits as told by your doctor. This is important. Contact a doctor if:  You get a migraine headache that is different or worse than others you have had.  You have more than 15 headache days in one month. Get help right away if:  Your migraine headache gets very bad.  Your migraine headache lasts  longer than 72 hours.  You have a fever.  You have a stiff neck.  You have trouble seeing.  Your muscles feel weak or like you cannot control them.  You start to lose your balance a lot.  You start to have trouble walking.  You pass out (faint).  You have a seizure. Summary  A migraine headache is a very strong throbbing pain on one side or both sides of your head. These headaches can also cause other symptoms.  This condition may be treated with medicines and changes to your lifestyle.  Keep a journal to find out what may bring on your migraine headaches.  Contact a doctor if you get a migraine headache that is different or worse than others you have had.  Contact your doctor if you have more than 15 headache days in a month. This information is not intended to replace advice given to you by your health care provider. Make sure you discuss any questions you have with your health care provider. Document Revised: 05/05/2018 Document Reviewed: 02/23/2018 Elsevier Patient Education  [place].

## 2019-07-24 NOTE — Progress Notes (Addendum)
PATIENT: Cynthia Frye DOB: August 21, 1960  REASON FOR VISIT: follow up HISTORY FROM: patient  Chief Complaint  Patient presents with  . Follow-up    migraine fu, nw rm, alone, pt reports migraines are better      HISTORY OF PRESENT ILLNESS: Today 07/24/19 Cynthia Frye is a 59 y.o. female here today for follow up for migraines. She continues topiramate 100mg  daily. She has not needed rizatriptan. She has taken Advil 400-600mg  as needed. Usually 1-2 times weekly. She does endorse some word finding concerns that have continued since starting topiramate. She has occasional tingling in her fingers but states this was present prior to starting medication. She denies new or worsening symptoms. No stroke like symptoms. She is followed closely by PCP and rheumatology.   HISTORY: (copied from my note on 04/25/2019)  Cynthia Frye is a 59 y.o. female here today for follow up of migraines.  She was seen 3 days ago by the ER for concerns of paresthesias of the left side of her face.  She felt as if her left lip was a little droopy.  She has chronic left-sided ptosis.  She denies any other concerning or strokelike symptoms.  Work-up was unremarkable.  She reports a high level anxiety as her family has a strong history of stroke.  She is taking topiramate and tolerating well.  She was seen by PCP yesterday who increased dose to 100 mg at bedtime.  He also increase metoprolol to twice daily.  So far she is tolerating these medications well.  She has follow-up with primary care in 2 weeks.  HISTORY: (copied from Dr Cathren Laine note on 04/19/2019)  ZWC:HENIDP Zoiey Christy a 59 y.o.femalehere as requested by Josetta Huddle, MDfor migraines. PMHxdizziness, disorders of the eustachian tube, other pancytopenia, cervicalgia, hypertension, disorders of the facial nerve, cervical radiculopathy, myalgia, lightheadedness, thyroid disease, sciatica, obesity, hypersomnia, situational stress,  migraine aura persistent, systemic sclerosis, migraine, syncope and collapse. I reviewed Dr. Inda Merlin notes, migraineshave been worsening, possibly due to discontinuation of her nonsteroidal anti-inflammatory medication which was diclofenac, she was taking that chronically twice daily, she was also on a steroid taper for carpal tunnel syndrome, trialed Maxalt, added topiramate at night, and increasedmetoprolol and referred her to [place] for evaluation. She reported severe left-sided throbbing, she has a visual aura scotoma followed by pounding retro-orbital headaches.  She had migraines in the past but only with triggers in the past every month, in January she had her first migraine of the year and she couldn't go to work, she had another one in February, she is seeing her allergy doctor to discuss foods and triggers. Since February she is having them more, Its starts over the eyes unilaterally but spreads and even sensitive to the touch skin on her scalp, she sees heat waves as her aura, if she can feel it coming on and she takes something and gets in a quiet room she may catch it. Associated photophobia/phonophobia, severe pain and can last 4-24, pulsing/pounding/throbbing. She has been out of work for 2 days with a migraine, she is having them every other day now. She went to the ED it was so bad. She does not wake up with them, not positional, no changes in symptoms. These are her normal migraines just happening more often.  Reviewed notes, labs and imaging from outside physicians, which showed:  Reviewed MRI of the brain images which were negative for acute pathology, agree with the following:  MRI 04/22/2019:Brain: There is  no evidence of acute infarct, intracranial hemorrhage, mass, midline shift, or extra-axial fluid collection. The ventricles and sulci are normal. The brain is normal in signal. No abnormal enhancement is identified.  Vascular: Major intracranial vascular flow voids are  preserved.  Skull and upper cervical Frye: No suspicious marrow lesion.  Sinuses/Orbits: Unremarkable orbits. Minimal mucosal thickening inferiorly in the maxillary sinuses. Clear mastoid air cells.  Other: None.  IMPRESSION: Unremarkable appearance of the brain  meds tried topamax, metoprolol  BMP normal 2020    REVIEW OF SYSTEMS: Out of a complete 14 system review of symptoms, the patient complains only of the following symptoms, headaches, fatigue and all other reviewed systems are negative.  ALLERGIES: Allergies  Allergen Reactions  . Cinnamon Other (See Comments)    Other reaction(s): Other (See Comments) Unknown  On allergy test Unknown  On allergy test  . Peanut-Containing Drug Products Hives  . Corn Oil Hives  . Corn-Containing Products Hives  . Other Rash    Red grapefruit and naval oranges- lips tingling and facial rash    HOME MEDICATIONS: Outpatient Medications Prior to Visit  Medication Sig Dispense Refill  . albuterol (PROVENTIL HFA;VENTOLIN HFA) 108 (90 Base) MCG/ACT inhaler Inhale 2 puffs into the lungs every 6 (six) hours as needed for wheezing or shortness of breath. 18 g 0  . aspirin 81 MG tablet Take 81 mg by mouth daily.    . Bepotastine Besilate (BEPREVE) 1.5 % SOLN Place 1 drop into both eyes 2 (two) times daily as needed (DRY EYES).    . Cholecalciferol (VITAMIN D) 2000 units CAPS Take 2,000 Units by mouth daily.     . Dietary Management Product (RHEUMATE) CAPS Take 1 capsule by mouth daily. [place]    . DIPROLENE 0.05 % ointment Apply 1 application topically as needed (SKIN IRRITATION).     . ferrous sulfate 325 (65 FE) MG tablet Take 325 mg by mouth daily with breakfast.    . Flaxseed, Linseed, (FLAXSEED OIL) 1000 MG CAPS Take 1,000 mg by mouth daily.    . Fluocinolone Acetonide Scalp 0.01 % OIL Apply 1 application topically daily as needed for itching.    . fluticasone (FLONASE) 50 MCG/ACT nasal spray Place 2  sprays into both nostrils daily. 16 g 12  . hydroxychloroquine (PLAQUENIL) 200 MG tablet Take 400 mg by mouth daily.   5  . Ibuprofen (ADVIL PO) Take 400-600 mg by mouth every 8 (eight) hours as needed (pain/headache).     . losartan-hydrochlorothiazide (HYZAAR) 50-12.5 MG tablet Take 1 tablet by mouth daily.     . methotrexate 50 MG/2ML injection Inject 25 mg into the skin once a week. 68mL = 25mg   3  . metoprolol succinate (TOPROL-XL) 50 MG 24 hr tablet Take 100 mg by mouth daily. Take with or immediately following a meal.     . rizatriptan (MAXALT-MLT) 10 MG disintegrating tablet Take 1 tablet (10 mg total) by mouth as needed for migraine. May repeat in 2 hours if needed 9 tablet 11  . topiramate (TOPAMAX) 50 MG tablet Take 1 tablet (50 mg total) by mouth at bedtime. (Patient taking differently: Take 50 mg by mouth at bedtime. 2 tabs) 30 tablet 6   No facility-administered medications prior to visit.    PAST MEDICAL HISTORY: Past Medical History:  Diagnosis Date  . Allergies    peanuts, corn, beans, red grapefruit, naval oranges  . Asthma    allergy shots and medication  .  DDD (degenerative disc disease), lumbar   . Eustachian tube dysfunction 12/2012   rhinitis, vertigo- Dr. Wilburn Cornelia, ENT   . Fibroids   . Heart murmur    Echo 1/18: EF 55-60, no RWMA, normal diastolic function, trivial AI, PASP 32  . History of cardiac catheterization    LHC 3/18: normal coronary arteries.   . History of nuclear stress test    ETT-Myoview 2/18: EF 62, + ECG response; apical and distal septal ischemia; intermediate risk.  Marland Kitchen Hypertension   . Iron deficiency anemia   . Lupus [facility]) 2011   Dr. Trudie Reed  . Migraine headache    trial of generic maxalt 10 mg, January 2021  . Obesity   . Seasonal allergies   . Seizure in childhood [facility])    as a child no treatment none x 30 years    PAST SURGICAL HISTORY: Past Surgical History:  Procedure Laterality Date  . ABDOMINAL HYSTERECTOMY    . BACK SURGERY   2009  . COLONOSCOPY WITH PROPOFOL N/A 04/11/2012   Procedure: COLONOSCOPY WITH PROPOFOL;  Surgeon: Garlan Fair, MD;  Location: [facility];  Service: Endoscopy;  Laterality: N/A;  . HERNIA REPAIR     as child unbilical  . Z3-Y partial discectomy and laminectomy     Dr. Christella Noa  . LEFT HEART CATH AND CORONARY ANGIOGRAPHY N/A 04/09/2016   Procedure: Left Heart Cath and Coronary Angiography;  Surgeon: Nelva Bush, MD;  Location: [facility];  Service: Cardiovascular;  Laterality: N/A;  . MOUTH SURGERY      FAMILY HISTORY: Family History  Problem Relation Age of Onset  . Asthma Mother   . Diabetes Mother   . Hypertension Mother   . Heart attack Father 11  . CAD Father   . Stroke Maternal Aunt   . Stroke Paternal Aunt   . Stroke Paternal Uncle   . Gout Other        uncles   . Ovarian cancer Neg Hx   . Breast cancer Neg Hx   . Colon cancer Neg Hx   . Migraines Neg Hx     SOCIAL HISTORY: Social History   Socioeconomic History  . Marital status: Married    Spouse name: Not on file  . Number of children: 0  . Years of education: Not on file  . Highest education level: Some college, no degree  Occupational History  . Not on file  Tobacco Use  . Smoking status: Never Smoker  . Smokeless tobacco: Never Used  Vaping Use  . Vaping Use: Never used  Substance and Sexual Activity  . Alcohol use: No  . Drug use: No  . Sexual activity: Not on file  Other Topics Concern  . 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 Difficulty of Paying Living Expenses:   Food Insecurity:   . Worried About Charity fundraiser in the Last Year:   . Arboriculturist in the Last Year:   Transportation Needs:    . Film/video editor (Medical):   Marland Kitchen Lack of Transportation (Non-Medical):   Physical Activity:   . Days of Exercise per  Week:   . Minutes of Exercise per Session:   Stress:   . Feeling of Stress :   Social Connections:   . Frequency of Communication with Friends and Family:   . Frequency of Social Gatherings with Friends and Family:   . Attends Religious Services:   . Active Member of Clubs or Organizations:   . Attends Archivist Meetings:   Marland Kitchen Marital Status:   Intimate Partner Violence:   . Fear of Current or Ex-Partner:   . Emotionally Abused:   Marland Kitchen Physically Abused:   . Sexually Abused:       PHYSICAL EXAM  Vitals:   07/24/19 1451  BP: 136/72  Pulse: 72  Weight: 210 lb (95.3 kg)  Height: 5\' 10"  (1.778 m)   Body mass index is 30.13 kg/m.  Generalized: Well developed, in no acute distress  Cardiology: normal rate and rhythm, no murmur noted Respiratory: clear to auscultation bilaterally  Neurological examination  Mentation: Alert oriented to time, place, history taking. Follows all commands speech and language fluent Cranial nerve II-XII: Pupils were equal round reactive to light. Extraocular movements were full, visual field were full  Motor: The motor testing reveals 5 over 5 strength of all 4 extremities. Good symmetric motor tone is noted throughout.  Gait and station: Gait is normal.   DIAGNOSTIC DATA (LABS, IMAGING, TESTING) - I reviewed patient records, labs, notes, testing and imaging myself where available.  No flowsheet data found.   Lab Results  Component Value Date   WBC 3.3 (L) 04/22/2019   HGB 13.2 04/22/2019   HCT 39.4 04/22/2019   MCV 91.6 04/22/2019   PLT 197 04/22/2019      Component Value Date/Time   NA 142 04/22/2019 1222   NA 142 04/02/2016 1122   K 3.3 (L) 04/22/2019 1222   CL 106 04/22/2019 1222   CO2 26 04/22/2019 1222   GLUCOSE 78 04/22/2019 1222   BUN 17 04/22/2019 1222   BUN 10 04/02/2016 1122   CREATININE  0.89 04/22/2019 1222   CALCIUM 10.4 (H) 04/22/2019 1222   PROT 8.6 (H) 04/22/2019 1222   ALBUMIN 4.8 04/22/2019 1222   AST 26 04/22/2019 1222   ALT 25 04/22/2019 1222   ALKPHOS 71 04/22/2019 1222   BILITOT 0.7 04/22/2019 1222   GFRNONAA >60 04/22/2019 1222   GFRAA >60 04/22/2019 1222   No results found for: CHOL, HDL, LDLCALC, LDLDIRECT, TRIG, CHOLHDL No results found for: HGBA1C No results found for: VITAMINB12 No results found for: TSH     ASSESSMENT AND PLAN 59 y.o. year old female  has a past medical history of Allergies, Asthma, DDD (degenerative disc disease), lumbar, Eustachian tube dysfunction (12/2012), Fibroids, Heart murmur, History of cardiac catheterization, History of nuclear stress test, Hypertension, Iron deficiency anemia, Lupus ([place]) (2011), Migraine headache, Obesity, Seasonal allergies, and Seizure in childhood ([place]). here with     ICD-10-CM   1. Chronic migraine without aura without status migrainosus, not intractable  G43.709 topiramate (TOPAMAX) 50 MG tablet    Ms. Woodroof is doing well today.  Headaches have significantly improved on topiramate 100 mg daily.  She does note more difficulty with word finding concerns.  I have suggested that she try taking 75 mg(1.5 tablets) to see if this helps.  She may take 75 to 100mg  nightly.  We will continue rizatriptan as needed for abortive therapy.  She was advised against regular use of Advil but may continue dosing 1-2 times weekly.  We have  reviewed stroke precautions.  She will continue close follow-up with primary care and rheumatology.  Adequate hydration, healthy well-balanced diet and regular exercise encouraged.  She will follow-up with me in 6 months, sooner if needed.  She verbalizes understanding and agreement with this plan.   No orders of the defined types were placed in this encounter.    Meds ordered this encounter  Medications  . topiramate (TOPAMAX) 50 MG tablet    Sig: Take 75mg  to 100mg  (1.5 - 2  tablets) daily at bedtime for headaches    Dispense:  180 tablet    Refill:  3    Order Specific Question:   Supervising Provider    Answer:   Melvenia Beam [[id number]]      I spent 15 minutes with the patient. 50% of this time was spent counseling and educating patient on plan of care and medications.    Debbora Presto, FNP-C 07/24/2019, 4:19 PM [facility] [address], [place],  [address] [phone]  Made any corrections needed, and agree with history, physical, neuro exam,assessment and plan as stated.     Sarina Ill, MD [facility]

## 2019-07-26 DIAGNOSIS — H40023 Open angle with borderline findings, high risk, bilateral: Secondary | ICD-10-CM | POA: Diagnosis not present

## 2019-09-03 DIAGNOSIS — M329 Systemic lupus erythematosus, unspecified: Secondary | ICD-10-CM | POA: Diagnosis not present

## 2019-09-03 DIAGNOSIS — R202 Paresthesia of skin: Secondary | ICD-10-CM | POA: Diagnosis not present

## 2019-09-03 DIAGNOSIS — M0600 Rheumatoid arthritis without rheumatoid factor, unspecified site: Secondary | ICD-10-CM | POA: Diagnosis not present

## 2019-09-03 DIAGNOSIS — M2550 Pain in unspecified joint: Secondary | ICD-10-CM | POA: Diagnosis not present

## 2019-09-11 DIAGNOSIS — M329 Systemic lupus erythematosus, unspecified: Secondary | ICD-10-CM | POA: Diagnosis not present

## 2019-09-17 DIAGNOSIS — D509 Iron deficiency anemia, unspecified: Secondary | ICD-10-CM | POA: Diagnosis not present

## 2019-09-17 DIAGNOSIS — G43909 Migraine, unspecified, not intractable, without status migrainosus: Secondary | ICD-10-CM | POA: Diagnosis not present

## 2019-09-17 DIAGNOSIS — M064 Inflammatory polyarthropathy: Secondary | ICD-10-CM | POA: Diagnosis not present

## 2019-09-17 DIAGNOSIS — K869 Disease of pancreas, unspecified: Secondary | ICD-10-CM | POA: Diagnosis not present

## 2019-09-17 DIAGNOSIS — R109 Unspecified abdominal pain: Secondary | ICD-10-CM | POA: Diagnosis not present

## 2019-09-18 ENCOUNTER — Other Ambulatory Visit: Payer: Self-pay | Admitting: Internal Medicine

## 2019-09-18 DIAGNOSIS — M064 Inflammatory polyarthropathy: Secondary | ICD-10-CM

## 2019-09-18 DIAGNOSIS — R1084 Generalized abdominal pain: Secondary | ICD-10-CM

## 2019-09-19 ENCOUNTER — Ambulatory Visit
Admission: RE | Admit: 2019-09-19 | Discharge: 2019-09-19 | Disposition: A | Discharge status: Home / Self Care | Payer: BC Managed Care – PPO | Source: Ambulatory Visit | Attending: Internal Medicine | Admitting: Internal Medicine

## 2019-09-19 DIAGNOSIS — R1084 Generalized abdominal pain: Secondary | ICD-10-CM

## 2019-09-19 DIAGNOSIS — K7689 Other specified diseases of liver: Secondary | ICD-10-CM | POA: Diagnosis not present

## 2019-09-19 DIAGNOSIS — I700 Atherosclerosis of aorta: Secondary | ICD-10-CM | POA: Diagnosis not present

## 2019-09-19 DIAGNOSIS — K8689 Other specified diseases of pancreas: Secondary | ICD-10-CM | POA: Diagnosis not present

## 2019-09-19 DIAGNOSIS — K862 Cyst of pancreas: Secondary | ICD-10-CM | POA: Diagnosis not present

## 2019-09-19 DIAGNOSIS — M064 Inflammatory polyarthropathy: Secondary | ICD-10-CM

## 2019-09-19 MED ORDER — IOPAMIDOL (ISOVUE-300) INJECTION 61%
100.0000 mL | Freq: Once | INTRAVENOUS | Status: AC | PRN
  Administered 2019-09-19: 100 mL via INTRAVENOUS

## 2019-09-21 DIAGNOSIS — Z1159 Encounter for screening for other viral diseases: Secondary | ICD-10-CM | POA: Diagnosis not present

## 2019-09-25 DIAGNOSIS — K838 Other specified diseases of biliary tract: Secondary | ICD-10-CM | POA: Diagnosis not present

## 2019-09-25 DIAGNOSIS — C251 Malignant neoplasm of body of pancreas: Secondary | ICD-10-CM | POA: Diagnosis not present

## 2019-09-25 DIAGNOSIS — R634 Abnormal weight loss: Secondary | ICD-10-CM | POA: Diagnosis not present

## 2019-09-25 DIAGNOSIS — K8689 Other specified diseases of pancreas: Secondary | ICD-10-CM | POA: Diagnosis not present

## 2019-09-25 DIAGNOSIS — R933 Abnormal findings on diagnostic imaging of other parts of digestive tract: Secondary | ICD-10-CM | POA: Diagnosis not present

## 2019-09-25 DIAGNOSIS — R1010 Upper abdominal pain, unspecified: Secondary | ICD-10-CM | POA: Diagnosis not present

## 2019-09-28 ENCOUNTER — Telehealth: Payer: Self-pay | Admitting: Hematology

## 2019-09-28 NOTE — Progress Notes (Signed)
Left voice message for patient regarding referral I received from Dr. Paulita Fujita at [place].  I have offered her an appointment for Tuesday 10/02/2019 at 2:40 to arrive by 2:15 to register.  I left my direct number and I asked her to call me back to let me know if she come for this appointment.

## 2019-09-28 NOTE — Progress Notes (Signed)
Spoke with patient she agrees to come on Tuesday 9/7 for 1:00 appointment with Dr. Burr Medico, she plans to arrive at least 20 minutes prior to her appointment, she knows she can bring one person with her to the appointment and that [facility] parking is available.

## 2019-09-28 NOTE — Telephone Encounter (Signed)
Created in Error

## 2019-10-02 ENCOUNTER — Ambulatory Visit: Payer: BC Managed Care – PPO | Admitting: Hematology

## 2019-10-02 ENCOUNTER — Inpatient Hospital Stay: Payer: BC Managed Care – PPO | Attending: Hematology | Admitting: Hematology

## 2019-10-02 ENCOUNTER — Other Ambulatory Visit: Payer: Self-pay | Admitting: General Surgery

## 2019-10-02 ENCOUNTER — Encounter: Payer: Self-pay | Admitting: Hematology

## 2019-10-02 ENCOUNTER — Other Ambulatory Visit: Payer: Self-pay

## 2019-10-02 DIAGNOSIS — M329 Systemic lupus erythematosus, unspecified: Secondary | ICD-10-CM | POA: Diagnosis not present

## 2019-10-02 DIAGNOSIS — Z9189 Other specified personal risk factors, not elsewhere classified: Secondary | ICD-10-CM | POA: Diagnosis not present

## 2019-10-02 DIAGNOSIS — G43909 Migraine, unspecified, not intractable, without status migrainosus: Secondary | ICD-10-CM | POA: Insufficient documentation

## 2019-10-02 DIAGNOSIS — F419 Anxiety disorder, unspecified: Secondary | ICD-10-CM | POA: Diagnosis not present

## 2019-10-02 DIAGNOSIS — K769 Liver disease, unspecified: Secondary | ICD-10-CM | POA: Diagnosis not present

## 2019-10-02 DIAGNOSIS — R634 Abnormal weight loss: Secondary | ICD-10-CM | POA: Diagnosis not present

## 2019-10-02 DIAGNOSIS — R1010 Upper abdominal pain, unspecified: Secondary | ICD-10-CM | POA: Insufficient documentation

## 2019-10-02 DIAGNOSIS — C250 Malignant neoplasm of head of pancreas: Secondary | ICD-10-CM | POA: Diagnosis not present

## 2019-10-02 DIAGNOSIS — C259 Malignant neoplasm of pancreas, unspecified: Secondary | ICD-10-CM | POA: Insufficient documentation

## 2019-10-02 DIAGNOSIS — I10 Essential (primary) hypertension: Secondary | ICD-10-CM | POA: Insufficient documentation

## 2019-10-02 NOTE — Progress Notes (Signed)
I met with patient and her husband [name] today prior to their consult with Dr. Burr Medico.  I explained my role as nurse navigator and she was given my card with my direct contact information.  They verbalized an understanding. She has an appointment for surgical consult with Dr. Barry Dienes this afternoon.

## 2019-10-02 NOTE — Progress Notes (Signed)
[place]   [phone] Fax:([phone]) [place] Note   Patient Care Team: Josetta Huddle, MD as PCP - General (Internal Medicine) Jonnie Finner, RN as Oncology Nurse Navigator Truitt Merle, MD as Consulting Physician (Oncology)  Date of Service:  10/02/2019   CHIEF COMPLAINTS/PURPOSE OF CONSULTATION:  Newly Diagnosed Pancreatic Cancer   REFERRING PHYSICIAN:  Dr Paulita Fujita  Oncology History Overview Note  Cancer Staging No matching staging information was found for the patient.    Pancreatic cancer ([facility])  09/19/2019 Imaging   CT AP w contrast 09/19/19  IMPRESSION: 1. Findings are highly concerning for probable primary pancreatic adenocarcinoma in the anterior aspect of the pancreatic head. Several prominent borderline enlarged lymph nodes are noted in the hepatoduodenal nodal station, and there are multiple indeterminate liver lesions which are highly concerning for probable hepatic metastases. Further evaluation with nonemergent abdominal MRI with and without IV gadolinium with MRCP is recommended in the near future to better evaluate these findings.   09/25/2019 Procedure   Upper EUS by Dr Paulita Fujita  IMPRESSION -There was no evidence of significant pathology in the left lobe of the liver.  -A few lymph nodes were visualized and measures in the peripancreatic region and porta hepata region.  -Hyperchoic material consistent with sludge was visualized endosonographically in the gallbladder.  -There was no sign of significant pathology in the common bile duct.  -A mass was identified in the pancreatic head. If biopsy results show adenocarcinoma, it would be staged T3N1Mx by endosonographic criteria. Fine needle aspiration performed.    09/25/2019 Initial Biopsy   FINAL MICROSCOPIC DIAGNOSIS: Fine needle aspirate, Pancreas;  MALIGNANT CELLS PRESENT CONSISTENT WITH ADENOCARCINOMA.    10/02/2019 Initial Diagnosis   Pancreatic cancer (HCC)       HISTORY OF PRESENTING ILLNESS:  Cynthia Frye 59 y.o. female is a here because of pancreatic cancer. The patient was referred by Dr Paulita Fujita. The patient presents to the clinic today accompanied by her husband.  She was having intermittent upper abdominal stomach cramps for a few weeks with bloating. She notes this is triggered by eating certain meals. Dr Inda Merlin gave her Hydrocodone for pain and PPI. She notes she had less than 10 pounds weight loss. She saw Dr Inda Merlin for this. She had CT scan for this on 09/19/19. A mass in abdomen was seen so she saw Dr Paulita Fujita She notes she had migraines in March and was started on Topomax. At that time she was 230 pounds and has gradually loss weight. She attributes this to her Topomax. Her medication helps her migraines. She was also seen by Dr Jaynee Eagles for her migraines.   Socially she is married and does not have children She does not drink alcohol or smoker. 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  She has a PMHx of pediatric seizures. 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 She is seeing Rheumatologist Dr Kimberlee Nearing. She is on methotrexate injections weekly long term and Plaquenil and Diclofenac. She has HTN. She notes she has a food allergy. She notes she recently had abnormal stress test, but was clear with more work up. She had hysterectomy due to fibroids. She denies family history of cancer.    REVIEW OF SYSTEMS:    Constitutional: Denies fevers, chills or abnormal night sweats (+) Weight Loss Eyes: Denies blurriness of vision, double vision or watery  eyes Ears, nose, mouth, throat, and face: Denies mucositis or sore throat Respiratory: Denies cough, dyspnea or wheezes Cardiovascular: Denies palpitation, chest discomfort or lower extremity swelling Gastrointestinal:  Denies nausea, heartburn or change in bowel habits (+) intermittent  upper abdominal cramps  Skin: Denies abnormal skin rashes Lymphatics: Denies new lymphadenopathy or easy bruising Neurological:Denies numbness, tingling or new weaknesses Behavioral/Psych: Mood is stable, no new changes  All other systems were reviewed with the patient and are negative.   MEDICAL HISTORY:  Past Medical History:  Diagnosis Date  . Allergies    peanuts, corn, beans, red grapefruit, naval oranges  . Asthma    allergy shots and medication  . DDD (degenerative disc disease), lumbar   . Eustachian tube dysfunction 12/2012   rhinitis, vertigo- Dr. Wilburn Cornelia, ENT   . Fibroids   . Heart murmur    Echo 1/18: EF 55-60, no RWMA, normal diastolic function, trivial AI, PASP 32  . History of cardiac catheterization    LHC 3/18: normal coronary arteries.   . History of nuclear stress test    ETT-Myoview 2/18: EF 62, + ECG response; apical and distal septal ischemia; intermediate risk.  Marland Kitchen Hypertension   . Iron deficiency anemia   . Lupus [facility]) 2011   Dr. Trudie Reed  . Migraine headache    trial of generic maxalt 10 mg, January 2021  . Obesity   . Seasonal allergies   . Seizure in childhood [facility])    as a child no treatment none x 30 years    SURGICAL HISTORY: Past Surgical History:  Procedure Laterality Date  . ABDOMINAL HYSTERECTOMY    . BACK SURGERY  2009  . COLONOSCOPY WITH PROPOFOL N/A 04/11/2012   Procedure: COLONOSCOPY WITH PROPOFOL;  Surgeon: Garlan Fair, MD;  Location: [facility] END[facility];  Service: Endoscopy;  Laterality: N/A;  . HERNIA REPAIR     as child unbilical  . R4-W partial discectomy and laminectomy     Dr. Christella Noa  . LEFT HEART CATH AND CORONARY ANGIOGRAPHY N/A 04/09/2016   Procedure: Left Heart Cath and Coronary Angiography;  Surgeon: Nelva Bush, MD;  Location: [facility];  Service: Cardiovascular;  Laterality: N/A;  . MOUTH SURGERY      SOCIAL HISTORY: Social History   Socioeconomic History  . Marital status: Married    Spouse name:  Not on file  . Number of children: 0  . Years of education: Not on file  . Highest education level: Some college, no degree  Occupational History  . Occupation: Customer service manager   Tobacco Use  . Smoking status: Never Smoker  . Smokeless tobacco: Never Used  Vaping Use  . Vaping Use: Never used  Substance and Sexual Activity  . Alcohol use: No  . Drug use: No  . Sexual activity: Not on file  Other Topics Concern  . 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 Difficulty of Paying Living Expenses: Not on file  Food Insecurity:   . Worried About Charity fundraiser in the Last Year: Not on file  . Ran Out of Food in the Last Year: Not on file  Transportation Needs:   .  Lack of Transportation (Medical): Not on file  . Lack of Transportation (Non-Medical): Not on file  Physical Activity:   . Days of Exercise per Week: Not on file  . Minutes of Exercise per Session: Not on file  Stress:   . Feeling of Stress : Not on file  Social Connections:   . Frequency of Communication with Friends and Family: Not on file  . Frequency of Social Gatherings with Friends and Family: Not on file  . Attends Religious Services: Not on file  . Active Member of Clubs or Organizations: Not on file  . Attends Archivist Meetings: Not on file  . Marital Status: Not on file  Intimate Partner Violence:   . Fear of Current or Ex-Partner: Not on file  . Emotionally Abused: Not on file  . Physically Abused: Not on file  . Sexually Abused: Not on file    FAMILY HISTORY: Family History  Problem Relation Age of Onset  . Asthma Mother   . Diabetes Mother   . Hypertension Mother   . Heart attack Father 86  . CAD Father   .  Stroke Maternal Aunt   . Stroke Paternal Aunt   . Stroke Paternal Uncle   . Gout Other        uncles   . Ovarian cancer Neg Hx   . Breast cancer Neg Hx   . Colon cancer Neg Hx   . Migraines Neg Hx     ALLERGIES:  is allergic to cinnamon, peanut-containing drug products, corn oil, corn-containing products, and other.  MEDICATIONS:  Current Outpatient Medications  Medication Sig Dispense Refill  . albuterol (PROVENTIL HFA;VENTOLIN HFA) 108 (90 Base) MCG/ACT inhaler Inhale 2 puffs into the lungs every 6 (six) hours as needed for wheezing or shortness of breath. 18 g 0  . aspirin 81 MG tablet Take 81 mg by mouth daily.    . Bepotastine Besilate (BEPREVE) 1.5 % SOLN Place 1 drop into both eyes 2 (two) times daily as needed (DRY EYES).    . Cholecalciferol (VITAMIN D) 2000 units CAPS Take 2,000 Units by mouth daily.     . Dietary Management Product (RHEUMATE) CAPS Take 1 capsule by mouth daily. [place]    . DIPROLENE 0.05 % ointment Apply 1 application topically as needed (SKIN IRRITATION).     . ferrous sulfate 325 (65 FE) MG tablet Take 325 mg by mouth daily with breakfast.    . Flaxseed, Linseed, (FLAXSEED OIL) 1000 MG CAPS Take 1,000 mg by mouth daily.    . Fluocinolone Acetonide Scalp 0.01 % OIL Apply 1 application topically daily as needed for itching.    . fluticasone (FLONASE) 50 MCG/ACT nasal spray Place 2 sprays into both nostrils daily. 16 g 12  . hydroxychloroquine (PLAQUENIL) 200 MG tablet Take 400 mg by mouth daily.   5  . losartan-hydrochlorothiazide (HYZAAR) 50-12.5 MG tablet Take 1 tablet by mouth daily.     . methotrexate 50 MG/2ML injection Inject 25 mg into the skin once a week. 72mL = 25mg   3  . metoprolol succinate (TOPROL-XL) 50 MG 24 hr tablet Take 100 mg by mouth daily. Take with or immediately following a meal.     . rizatriptan (MAXALT-MLT) 10 MG disintegrating tablet Take 1 tablet (10 mg total) by mouth as needed for migraine. May repeat in 2  hours if needed 9 tablet 11  . topiramate (TOPAMAX) 50 MG tablet Take 75mg  to 100mg  (1.5 -  2 tablets) daily at bedtime for headaches 180 tablet 3   No current facility-administered medications for this visit.    PHYSICAL EXAMINATION: ECOG PERFORMANCE STATUS: 1 - Symptomatic but completely ambulatory  Vitals:   10/02/19 1244  BP: (!) 153/91  Pulse: (!) 123  Resp: 18  Temp: 98 F (36.7 C)  SpO2: 100%   Filed Weights   10/02/19 1244  Weight: 194 lb 14.4 oz (88.4 kg)    GENERAL:alert, no distress and comfortable SKIN: skin color, texture, turgor are normal, no rashes or significant lesions EYES: normal, Conjunctiva are pink and non-injected, sclera clear  NECK: supple, thyroid normal size, non-tender, without nodularity LYMPH:  no palpable lymphadenopathy in the cervical, axillary  LUNGS: clear to auscultation and percussion with normal breathing effort HEART: regular rate & rhythm and no murmurs and no lower extremity edema ABDOMEN:abdomen soft, non-tender and normal bowel sounds (+) Mild epigastric tenderness Musculoskeletal:no cyanosis of digits and no clubbing  NEURO: alert & oriented x 3 with fluent speech, no focal motor/sensory deficits  LABORATORY DATA:  I have reviewed the data as listed CBC Latest Ref Rng & Units 04/22/2019 02/22/2018 04/02/2016  WBC 4.0 - 10.5 K/uL 3.3(L) 2.8(L) 2.5(LL)  Hemoglobin 12.0 - 15.0 g/dL 13.2 11.7(L) 12.5  Hematocrit 36 - 46 % 39.4 35.2(L) 36.5  Platelets 150 - 400 K/uL 197 195 211    CMP Latest Ref Rng & Units 04/22/2019 02/22/2018 04/02/2016  Glucose 70 - 99 mg/dL 78 94 89  BUN 6 - 20 mg/dL 17 18 10   Creatinine 0.44 - 1.00 mg/dL 0.89 0.88 0.75  Sodium 135 - 145 mmol/L 142 139 142  Potassium 3.5 - 5.1 mmol/L 3.3(L) 4.2 4.2  Chloride 98 - 111 mmol/L 106 106 104  CO2 22 - 32 mmol/L 26 26 24   Calcium 8.9 - 10.3 mg/dL 10.4(H) 9.5 10.0  Total Protein 6.5 - 8.1 g/dL 8.6(H) - -  Total Bilirubin 0.3 - 1.2 mg/dL 0.7 - -  Alkaline Phos 38 - 126  U/L 71 - -  AST 15 - 41 U/L 26 - -  ALT 0 - 44 U/L 25 - -     RADIOGRAPHIC STUDIES: I have personally reviewed the radiological images as listed and agreed with the findings in the report. CT ABDOMEN W CONTRAST  Result Date: 09/19/2019 CLINICAL DATA:  59 year old female with history of abdominal cramping along with few weeks of constipation. EXAM: CT ABDOMEN WITH CONTRAST TECHNIQUE: Multidetector CT imaging of the abdomen was performed using the standard protocol following bolus administration of intravenous contrast. CONTRAST:  130mL ISOVUE-300 IOPAMIDOL (ISOVUE-300) INJECTION 61% COMPARISON:  No priors. FINDINGS: Lower chest: Unremarkable. Hepatobiliary: Poorly defined subcentimeter areas of low attenuation noted in the liver, best appreciated on axial image 7 of series 2, axial image 9 of series 2 and axial image 22 of series 5 in segments 4A/8, 7 and 5 respectively. No other larger hepatic lesions. No intra or extrahepatic biliary ductal dilatation. Gallbladder is normal in appearance. Pancreas: In the anterior aspect of the pancreatic head (axial image 28 of series 2 and coronal image 46 of series 8) there is a 3.0 x 1.9 x 2.2 cm hypovascular mass with some internal cystic components, highly concerning for primary pancreatic neoplasm. This is associated with mild dilatation of the main pancreatic duct which measures up to 7 mm in the proximal body. This lesion appears closely approximated to the superior mesenteric vein which appears narrowed as it traverses adjacent to the lesion. Lesion is clearly  separate from the portal vein and the superior mesenteric artery. No definite involvement of the common hepatic artery. No peripancreatic fluid collections or inflammatory changes. Spleen: Unremarkable. Adrenals/Urinary Tract: Bilateral kidneys and adrenal glands are normal in appearance. No hydroureteronephrosis in the visualized portions of the abdomen. Stomach/Bowel: Unremarkable. Vascular/Lymphatic:  Vascular findings pertinent to potential pancreatic neoplasm, as discussed above. Aortic atherosclerosis, without evidence of aneurysm or dissection in the visualized portions of the abdomen. No definite lymphadenopathy noted in the abdomen, although there are some prominent hepatoduodenal ligament lymph nodes measuring up to 6 mm in short axis. Other: No significant volume of ascites noted in the visualized portions of the peritoneal cavity. Musculoskeletal: There are no aggressive appearing lytic or blastic lesions noted in the visualized portions of the skeleton. IMPRESSION: 1. Findings are highly concerning for probable primary pancreatic adenocarcinoma in the anterior aspect of the pancreatic head. Several prominent borderline enlarged lymph nodes are noted in the hepatoduodenal nodal station, and there are multiple indeterminate liver lesions which are highly concerning for probable hepatic metastases. Further evaluation with nonemergent abdominal MRI with and without IV gadolinium with MRCP is recommended in the near future to better evaluate these findings. These results will be called to the ordering clinician or representative by the Radiologist Assistant, and communication documented in the PACS or [employer]. Electronically Signed   By: Vinnie Langton M.D.   On: 09/19/2019 10:12    ASSESSMENT & PLAN:  Shikha Bibb is a 59 y.o. African American female with a history of DDD, heart murmur, HTN, Lupus, Obesity, and Migraines.    1. Pancreatic adenocarcinoma in the head, cT3N1Mx, with multiple indeterminate liver lesions -I discussed her image findings and her biopsy results with her and her husband. She was found to have a 3.0cm tumor in head of pancrease on 09/19/19 CT AP. Her EUS biopsy with Dr Paulita Fujita on 09/25/19 shows pancreatic adenocarcinoma.  -Her CT Scan also shows several subcentimeter liver lesions suspicious for metastasis, also technically indeterminate.  I discussed the  option of PET and liver MRI for further evaluation.  I recommend liver biopsy to be definitve if it's accessible, will review with IR. I also recommend PET scan or Liver MRI plus CT Chest to further evaluate for distant metastasis. She is agreeable.  -We discussed if further imaging and/or liver biopsy confirms metastatic disease in liver, this is not curable, but treatable, and I would recommend for disease control. -If no evidence of or other distant metastasis, the standard care for her pancreatic cancer is Whipple surgery, with neoadjuvant or adjuvant chemotherapy to reduce her risk of recurrence. I dicussed this is an aggressive disease and has high rate of recurrence with surgery alone.  -I discussed the role of chemotherapy is to shrink tumor and reduce risk of recurrence before surgery if she is a candidate for surgery.  -She plans to consult with surgeon Dr Barry Dienes today.  -if she does have metastatic disease, I will requested Molecular testing and Foundation One of biopsy sample and Genetic testing to see if she is eligible for target or immunotherapy treatment options.    2. Upper abdominal cramps, Weight loss -Secondary to #1  -She has had intermittent upper abdominal cramping after certain meals. She also has had a gradual 40 pounds weight loss since March 2021.  -Her appetite is otherwise stable.  -Will refer to dietician. She notes she does have certain food allergies.  -For pain Dr Inda Merlin gave her Hydrocodone, but not taken much so  far.    3. Comorbidities: Lupus, HTN, Migraines  -Symptomatic in her joints, Dx many years ago  -She is being followed by Rheumatologist Dr Wyline Copas and being treated with weekly Methotrexate injections, Plaquenil and Diclofenac. -For Migraines is well controlled on Topomax. She has been seen by Dr Jaynee Eagles.    3. Anxiety, Social Support  -She has been very anxious with the fast pace workup and unexpected diagnosis.  -I will refer her to SW to talk as well  as financial aid for any needed assistance.  -She lives with her husband in [place]. She does not have children. She does have a brother who lives close by but no other near relatives. -I discussed she may need longer leave from work due to treatment.  -Her husband is more interested in baseline prognosis and cancer, but the patient is not interested at this time. I suggest they use [facility] if interested.    PLAN:  -Development worker, community, SW and Genetic referral  -PET scan or CT Chest and MRI Liver in 1-2 weeks  -IR liver biopsy in 1-2 weeks  -will discuss in GI tumor board next week -I will see her back after the above work up    Orders Placed This Encounter  Procedures  . NM PET Image Initial (PI) Skull Base To Thigh    Standing Status:   Future    Standing Expiration Date:   10/01/2020    Order Specific Question:   If indicated for the ordered procedure, I authorize the administration of a radiopharmaceutical per Radiology protocol    Answer:   Yes    Order Specific Question:   Is the patient pregnant?    Answer:   No    Order Specific Question:   Preferred imaging location?    Answer:   [facility]    Order Specific Question:   Radiology Contrast Protocol - do NOT remove file path    Answer:   [email or web address]  . CT Biopsy    Standing Status:   Future    Standing Expiration Date:   10/01/2020    Order Specific Question:   Lab orders requested (DO NOT place separate lab orders, these will be automatically ordered during procedure specimen collection):    Answer:   Surgical Pathology    Order Specific Question:   Reason for Exam (SYMPTOM  OR DIAGNOSIS REQUIRED)    Answer:   liver lesions, rule out metastasis    Order Specific Question:   Is patient pregnant?    Answer:   No    Order Specific Question:   Preferred location?    Answer:   [facility]    Order Specific Question:   Radiology Contrast Protocol - do  NOT remove file path    Answer:   [email or web address]  . Ambulatory referral to Social Work    Referral Priority:   Routine    Referral Type:   Consultation    Referral Reason:   Specialty Services Required    Number of Visits Requested:   1  . Amb Referral to Nutrition and Diabetic E    Referral Priority:   Urgent    Referral Type:   Consultation    Referral Reason:   Specialty Services Required    Number of Visits Requested:   1    All questions were answered. The patient knows to call the clinic with any problems, questions or concerns. The total time spent in the appointment was  60 minutes.     Truitt Merle, MD 10/02/2019 2:43 PM  I, Joslyn Devon, am acting as scribe for Truitt Merle, MD.   I have reviewed the above documentation for accuracy and completeness, and I agree with the above.

## 2019-10-03 ENCOUNTER — Telehealth: Payer: Self-pay | Admitting: Hematology

## 2019-10-03 ENCOUNTER — Encounter: Payer: Self-pay | Admitting: *Deleted

## 2019-10-03 ENCOUNTER — Other Ambulatory Visit: Payer: Self-pay

## 2019-10-03 DIAGNOSIS — C250 Malignant neoplasm of head of pancreas: Secondary | ICD-10-CM

## 2019-10-03 NOTE — Telephone Encounter (Signed)
Called pt per 9/07 sch msg - pt is aware of appts

## 2019-10-03 NOTE — Progress Notes (Signed)
  [place] Initial Psychosocial Assessment Clinical Social Work  Clinical Social Work referred by medical oncologistto assess psychosocial, emotional, mental health, and spiritual needs of the patient.   Barriers to care/review of distress screen:  - Transportation:   o Able to get to appointments: yes - Help at home:   o Living Situation: patient lives with spouse - Support system: Patient's primary support is spouse. She indicated they have a small, but supportive family.   o Level of family support:  Good o Support system includes: Family and Friends/Colleagues - Finances:   o Employment status: currently employed on [employer] of income: Employment  What is your understanding of where you are with your cancer? Its cause?  Your treatment plan and what happens next?   Patient shared in detail outcome of her visits with Dr. Burr Medico and Dr. Barry Dienes.  Mrs. Vales expressed good understanding of her diagnosis and "next steps", had many questions surrounding anticipating of chemotherapy and side effects.   What are your worries for the future as you begin treatment for cancer? Overwhelmed by information treatment related side effects- not knowing what to expect  CSW Summary:  Patient and family psychosocial functioning including strengths, limitations, and coping skills:  Strengths: Ability for insight Average or above average intelligence Communication skills Motivation for treatment/growth   Identifications of barriers to care: Illness-related problems, Adjustment to Illness and Coping/Communication  Patient rated her anxiety at this time (scale 0-10) at a level 10, indicated severe anxiety.  She shared she considers herself to be an anxious person prior to diagnosis.  CSW recommended counseling to serve as safe space to process emotions and develop coping skills.  Mrs. Woodhams was very interested.  CSW will make referral to counseling intern to establish therapy.  Clinical Social Worker  follow up needed:  Brief Counseling/Psychotherapy and Referral to [facility] counseling services  [place], MSW, LCSW, OSW-C Clinical Social Worker [facility] [phone]

## 2019-10-05 ENCOUNTER — Other Ambulatory Visit: Payer: Self-pay

## 2019-10-05 ENCOUNTER — Other Ambulatory Visit: Payer: Self-pay | Admitting: Hematology

## 2019-10-05 ENCOUNTER — Encounter (HOSPITAL_BASED_OUTPATIENT_CLINIC_OR_DEPARTMENT_OTHER): Payer: Self-pay | Admitting: General Surgery

## 2019-10-05 DIAGNOSIS — C250 Malignant neoplasm of head of pancreas: Secondary | ICD-10-CM

## 2019-10-05 NOTE — Progress Notes (Signed)
Spoke with patient regarding change in PET scan date/time due to Dr. Barry Dienes wanting to place her port on 9/16.  The new date is Monday 9/20 at 9:00 arrive by 8:30 at [facility], NPO 6 hours prior except sips of water.  No carbs the evening before, no gum, no candy.  Patient verbalized an understanding.

## 2019-10-08 ENCOUNTER — Other Ambulatory Visit (HOSPITAL_COMMUNITY)
Admission: RE | Admit: 2019-10-08 | Discharge: 2019-10-08 | Disposition: A | Discharge status: Home / Self Care | Payer: BC Managed Care – PPO | Source: Ambulatory Visit | Attending: General Surgery | Admitting: General Surgery

## 2019-10-08 DIAGNOSIS — Z01812 Encounter for preprocedural laboratory examination: Secondary | ICD-10-CM | POA: Diagnosis not present

## 2019-10-08 DIAGNOSIS — Z20822 Contact with and (suspected) exposure to covid-19: Secondary | ICD-10-CM | POA: Diagnosis not present

## 2019-10-08 LAB — SARS CORONAVIRUS 2 (TAT 6-24 HRS): SARS Coronavirus 2: NEGATIVE

## 2019-10-08 NOTE — Progress Notes (Signed)

## 2019-10-08 NOTE — Progress Notes (Signed)
Spoke with patient regarding the need to obtain MRI of abdomen prior to scheduling a liver biopsy.  It is scheduled for 9/21 to arrive at [facility] by 11:30 NPO for 4 hours prior. She verbalized an understanding.

## 2019-10-09 NOTE — H&P (Signed)
Cynthia Frye Appointment: 10/02/2019 3:00 PM Location: [facility] Patient #: [id number] DOB: May 21, 1960 Married / Language: English / Race: Black or African American Female   History of Present Illness Cynthia Klein MD; 10/02/2019 4:14 PM) The patient is a 59 year old female who presents with pancreatic cancer. Pt is a 59 yo M referred by Dr. Paulita Frye for a new diagnosis of adenocarcinoma of the pancreatic head dx 08/2019. She only had 2-3 weeks of symptoms of intermittent moderate intensity upper mid abdominal pain. She sought intervention very early. She had "abnormal bloodwork" that in conjunction with the pain, led to an abdominal CT. This showed a probable pancreatic head mass. Dr. Paulita Frye confirmed this with EUS. Cytology was positive for malignancy. She had abutment of the SMV, but no other vessels. She also had peripancreatic nodes that were visible. This was a uT3N1 for clinical staging. Pt denies jaundice. She denies n/v. She has had bloating and 40 pound weight loss. She denies diabetes or diarrhea. She is prone toward constipation. She is understandably very anxious.   She denies personal or family history of cancer or pancreatitis. She has h/o lupus and is on plaquenil, voltaren, and methotrexate. She had a short course of steroids in March for a brief burst of headaches that led to worse headaches.   CT abd 09/19/2019 IMPRESSION: 1. Findings are highly concerning for probable primary pancreatic adenocarcinoma in the anterior aspect of the pancreatic head. Several prominent borderline enlarged lymph nodes are noted in the hepatoduodenal nodal station, and there are multiple indeterminate liver lesions which are highly concerning for probable hepatic metastases. Further evaluation with nonemergent abdominal MRI with and without IV gadolinium with MRCP is recommended in the near future to better evaluate these findings.  These results will be called to the  ordering clinician or representative by the Radiologist Assistant, and communication documented in the PACS or [employer].    Cytology 09/25/2019 from [facility] phone [phone] Picture of path report placed in Epic.   EUS reviewed. Report in allscripts.   Labs are from march and show normal LFTs.    Past Surgical History (Cynthia Frye, Cynthia Frye; 10/02/2019 4:00 PM) Hysterectomy (not due to cancer) - Partial   Diagnostic Studies History (Cynthia Frye, CMA; 10/02/2019 4:00 PM) Colonoscopy  5-10 years ago Mammogram  within last year Pap Smear  1-5 years ago  Allergies Cynthia Frye, RMA; 10/02/2019 3:03 PM) predniSONE *CORTICOSTEROIDS*  Headache. Allergies Reconciled   Medication History Cynthia Frye, [place]; 10/02/2019 3:14 PM) Albuterol Sulfate HFA (108 (90 Base)MCG/ACT Aerosol Soln, Inhalation) Active. Aspirin (81MG  Tablet Chewable, Oral) Active. Hydroxychloroquine Sulfate (200MG  Tablet, Oral) Active. Losartan Potassium-HCTZ (50-12.5MG  Tablet, Oral) Active. Metoprolol Succinate ER (50MG  Tablet ER 24HR, Oral) Active. Rizatriptan Benzoate (10MG  Tablet, Oral) Active. Topiramate (50MG  Tablet, Oral) Active. Ferrous Sulfate (325 (65 Fe)MG Tablet, Oral) Active. Bepotastine Besilate (1.5% Solution, Ophthalmic) Active. Vitamin D (25000IU Capsule, 2000 Oral) Active. Flaxseed Oil (1000MG  Capsule, Oral) Active. Methotrexate Sodium (50MG /2ML Solution, Injection) Active.  Pregnancy / Birth History (Cynthia Frye, [place]; 10/02/2019 4:00 PM) Age at menarche  49 years. Gravida  0 Para  0 Regular periods   Other Problems (Cynthia Frye, CMA; 10/02/2019 4:00 PM) Arthritis  Asthma  Heart murmur  High blood pressure  Migraine Headache  Pancreatic Cancer  Seizure Disorder     Review of Systems (Cynthia Frye CMA; 10/02/2019 4:00 PM) General Present- Weight Loss. Not Present- Appetite Loss, Chills, Fatigue, Fever, Night Sweats and Weight  Gain. Skin Present- Rash. Not  Present- Change in Wart/Mole, Dryness, Hives, Jaundice, New Lesions, Non-Healing Wounds and Ulcer. HEENT Present- Seasonal Allergies and Wears glasses/contact lenses. Not Present- Earache, Hearing Loss, Hoarseness, Nose Bleed, Oral Ulcers, Ringing in the Ears, Sinus Pain, Sore Throat, Visual Disturbances and Yellow Eyes. Respiratory Not Present- Bloody sputum, Chronic Cough, Difficulty Breathing, Snoring and Wheezing. Breast Not Present- Breast Mass, Breast Pain, Nipple Discharge and Skin Changes. Cardiovascular Not Present- Chest Pain, Difficulty Breathing Lying Down, Leg Cramps, Palpitations, Rapid Heart Rate, Shortness of Breath and Swelling of Extremities. Gastrointestinal Present- Abdominal Pain, Bloating and Indigestion. Not Present- Bloody Stool, Change in Bowel Habits, Chronic diarrhea, Constipation, Difficulty Swallowing, Excessive gas, Gets full quickly at meals, Hemorrhoids, Nausea, Rectal Pain and Vomiting. Musculoskeletal Present- Back Pain and Joint Pain. Not Present- Joint Stiffness, Muscle Pain, Muscle Weakness and Swelling of Extremities. Neurological Present- Headaches, Numbness and Tingling. Not Present- Decreased Memory, Fainting, Seizures, Tremor, Trouble walking and Weakness. Psychiatric Present- Change in Sleep Pattern. Not Present- Anxiety, Bipolar, Depression, Fearful and Frequent crying. Hematology Present- Blood Thinners. Not Present- Easy Bruising, Excessive bleeding, Gland problems, HIV and Persistent Infections.  Vitals Cynthia Frye RMA; 10/02/2019 3:14 PM) 10/02/2019 3:14 PM Weight: 194.38 lb Height: 67in Body Surface Area: 2 m Body Mass Index: 30.44 kg/m  Temp.: 98.18F  Pulse: 131 (Regular)  P.OX: 99% (Room air) BP: 132/84(Sitting, Left Arm, Standard)       Physical Exam Cynthia Klein MD; 10/02/2019 4:15 PM) General Mental Status-Alert. General Appearance-Consistent with stated age. Hydration-Well  hydrated. Voice-Normal.  Head and Neck Head-normocephalic, atraumatic with no lesions or palpable masses. Trachea-midline. Thyroid Gland Characteristics - normal size and consistency.  Eye Eyeball - Bilateral-Extraocular movements intact. Sclera/Conjunctiva - Bilateral-No scleral icterus.  Chest and Lung Exam Chest and lung exam reveals -quiet, even and easy respiratory effort with no use of accessory muscles and on auscultation, normal breath sounds, no adventitious sounds and normal vocal resonance. Inspection Chest Wall - Normal. Back - normal.  Cardiovascular Cardiovascular examination reveals -normal heart sounds, regular rate and rhythm with no murmurs and normal pedal pulses bilaterally.  Abdomen Inspection Inspection of the abdomen reveals - No Hernias. Palpation/Percussion Palpation and Percussion of the abdomen reveal - Soft, No Rebound tenderness, No Rigidity (guarding) and No hepatosplenomegaly. Note: mild epigastric tenderness. Auscultation Auscultation of the abdomen reveals - Bowel sounds normal.  Neurologic Neurologic evaluation reveals -alert and oriented x 3 with no impairment of recent or remote memory. Mental Status-Normal.  Musculoskeletal Global Assessment -Note: no gross deformities.  Normal Exam - Left-Upper Extremity Strength Normal and Lower Extremity Strength Normal. Normal Exam - Right-Upper Extremity Strength Normal and Lower Extremity Strength Normal.  Lymphatic Head & Neck  General Head & Neck Lymphatics: Bilateral - Description - Normal. Axillary  General Axillary Region: Bilateral - Description - Normal. Tenderness - Non Tender. Femoral & Inguinal  Generalized Femoral & Inguinal Lymphatics: Bilateral - Description - No Generalized lymphadenopathy.    Assessment & Plan Cynthia Klein MD; 10/02/2019 4:23 PM) ADENOCARCINOMA OF HEAD OF PANCREAS (C25.0) Impression: I agree wtih Dr. Burr Medico with neoadjuvant  chemotherapy. I will place port. I reviewed port along with risks/benefits. I showed anatomic model. I reviewed risk of pneumothorax and malfunction/malposition.  I also discussed whipple with diagrams of anatomy. I reviewed the progression of dx laparoscopy and whipple after restaging scans if no metastatic disease is found.  45 min spent in evaluation, examination, counseling, and coordination of care. >50% spent in counseling.  I discussed risk of recurrence even if we have  negative margins post op. Will also need to get CA 19-9 . Current Plans Pt Education - ccs port insertion education LESION OF LIVER WITH HIGH RISK FOR MALIGNANT NEOPLASM (K76.9) Impression: The multiple liver lesions are risky for possible metastatic disease.  She is getting biopsy set up. LUPUS (M32.9) Impression: no history of pancreatitis, mostly joint pain and malar rash. ON METHOTREXATE THERAPY (U51.460) Impression: Will contact Dr. Trudie Reed to see if MTX needs to be held for port placement.    Signed electronically by Cynthia Klein, MD (10/02/2019 4:24 PM)

## 2019-10-10 ENCOUNTER — Other Ambulatory Visit: Payer: Self-pay | Admitting: Hematology

## 2019-10-10 ENCOUNTER — Other Ambulatory Visit: Payer: Self-pay

## 2019-10-10 DIAGNOSIS — C250 Malignant neoplasm of head of pancreas: Secondary | ICD-10-CM

## 2019-10-10 NOTE — Progress Notes (Signed)
Chart review.

## 2019-10-11 ENCOUNTER — Ambulatory Visit (HOSPITAL_BASED_OUTPATIENT_CLINIC_OR_DEPARTMENT_OTHER): Payer: BC Managed Care – PPO | Admitting: Certified Registered Nurse Anesthetist

## 2019-10-11 ENCOUNTER — Ambulatory Visit (HOSPITAL_COMMUNITY): Payer: BC Managed Care – PPO

## 2019-10-11 ENCOUNTER — Encounter (HOSPITAL_BASED_OUTPATIENT_CLINIC_OR_DEPARTMENT_OTHER)
Admission: RE | Disposition: A | Discharge status: Home / Self Care | Payer: Self-pay | Source: Home / Self Care | Attending: General Surgery

## 2019-10-11 ENCOUNTER — Encounter (HOSPITAL_BASED_OUTPATIENT_CLINIC_OR_DEPARTMENT_OTHER): Payer: Self-pay | Admitting: General Surgery

## 2019-10-11 ENCOUNTER — Other Ambulatory Visit: Payer: Self-pay

## 2019-10-11 ENCOUNTER — Ambulatory Visit (HOSPITAL_BASED_OUTPATIENT_CLINIC_OR_DEPARTMENT_OTHER)
Admission: RE | Admit: 2019-10-11 | Discharge: 2019-10-11 | Disposition: A | Discharge status: Home / Self Care | Payer: BC Managed Care – PPO | Attending: General Surgery | Admitting: General Surgery

## 2019-10-11 DIAGNOSIS — G43909 Migraine, unspecified, not intractable, without status migrainosus: Secondary | ICD-10-CM | POA: Insufficient documentation

## 2019-10-11 DIAGNOSIS — Z419 Encounter for procedure for purposes other than remedying health state, unspecified: Secondary | ICD-10-CM

## 2019-10-11 DIAGNOSIS — Z95828 Presence of other vascular implants and grafts: Secondary | ICD-10-CM

## 2019-10-11 DIAGNOSIS — R011 Cardiac murmur, unspecified: Secondary | ICD-10-CM | POA: Diagnosis not present

## 2019-10-11 DIAGNOSIS — J45909 Unspecified asthma, uncomplicated: Secondary | ICD-10-CM | POA: Diagnosis not present

## 2019-10-11 DIAGNOSIS — Z7982 Long term (current) use of aspirin: Secondary | ICD-10-CM | POA: Diagnosis not present

## 2019-10-11 DIAGNOSIS — I10 Essential (primary) hypertension: Secondary | ICD-10-CM | POA: Diagnosis not present

## 2019-10-11 DIAGNOSIS — M329 Systemic lupus erythematosus, unspecified: Secondary | ICD-10-CM | POA: Insufficient documentation

## 2019-10-11 DIAGNOSIS — R569 Unspecified convulsions: Secondary | ICD-10-CM | POA: Insufficient documentation

## 2019-10-11 DIAGNOSIS — Z79899 Other long term (current) drug therapy: Secondary | ICD-10-CM | POA: Diagnosis not present

## 2019-10-11 DIAGNOSIS — M1990 Unspecified osteoarthritis, unspecified site: Secondary | ICD-10-CM | POA: Diagnosis not present

## 2019-10-11 DIAGNOSIS — C250 Malignant neoplasm of head of pancreas: Secondary | ICD-10-CM | POA: Diagnosis not present

## 2019-10-11 DIAGNOSIS — Z90710 Acquired absence of both cervix and uterus: Secondary | ICD-10-CM | POA: Diagnosis not present

## 2019-10-11 DIAGNOSIS — Z452 Encounter for adjustment and management of vascular access device: Secondary | ICD-10-CM | POA: Diagnosis not present

## 2019-10-11 DIAGNOSIS — C259 Malignant neoplasm of pancreas, unspecified: Secondary | ICD-10-CM | POA: Diagnosis not present

## 2019-10-11 DIAGNOSIS — Z888 Allergy status to other drugs, medicaments and biological substances status: Secondary | ICD-10-CM | POA: Diagnosis not present

## 2019-10-11 DIAGNOSIS — Z7951 Long term (current) use of inhaled steroids: Secondary | ICD-10-CM | POA: Diagnosis not present

## 2019-10-11 HISTORY — PX: PORTACATH PLACEMENT: SHX2246

## 2019-10-11 SURGERY — INSERTION, TUNNELED CENTRAL VENOUS DEVICE, WITH PORT
Anesthesia: General | Site: Chest

## 2019-10-11 MED ORDER — AMISULPRIDE (ANTIEMETIC) 5 MG/2ML IV SOLN: 10.0000 mg | Freq: Once | INTRAVENOUS | Status: DC | PRN

## 2019-10-11 MED ORDER — ONDANSETRON HCL 4 MG/2ML IJ SOLN
INTRAMUSCULAR | Status: AC
  Filled 2019-10-11: qty 2

## 2019-10-11 MED ORDER — HYDROMORPHONE HCL 1 MG/ML IJ SOLN: 0.2500 mg | INTRAMUSCULAR | Status: DC | PRN

## 2019-10-11 MED ORDER — OXYCODONE HCL 5 MG PO TABS: 5.0000 mg | ORAL_TABLET | Freq: Four times a day (QID) | ORAL | 0 refills | Status: DC | PRN

## 2019-10-11 MED ORDER — LIDOCAINE 2% (20 MG/ML) 5 ML SYRINGE
INTRAMUSCULAR | Status: AC
  Filled 2019-10-11: qty 5

## 2019-10-11 MED ORDER — OXYCODONE HCL 5 MG PO TABS: 5.0000 mg | ORAL_TABLET | Freq: Once | ORAL | Status: DC | PRN

## 2019-10-11 MED ORDER — HEPARIN (PORCINE) IN NACL 2-0.9 UNITS/ML
INTRAMUSCULAR | Status: AC | PRN
  Administered 2019-10-11: 500 mL

## 2019-10-11 MED ORDER — PROMETHAZINE HCL 25 MG/ML IJ SOLN: 6.2500 mg | INTRAMUSCULAR | Status: DC | PRN

## 2019-10-11 MED ORDER — ACETAMINOPHEN 500 MG PO TABS
1000.0000 mg | ORAL_TABLET | ORAL | Status: AC
  Administered 2019-10-11: 1000 mg via ORAL

## 2019-10-11 MED ORDER — PROPOFOL 10 MG/ML IV BOLUS
INTRAVENOUS | Status: AC
  Filled 2019-10-11: qty 40

## 2019-10-11 MED ORDER — PROPOFOL 10 MG/ML IV BOLUS
INTRAVENOUS | Status: DC | PRN
  Administered 2019-10-11: 200 mg via INTRAVENOUS

## 2019-10-11 MED ORDER — MIDAZOLAM HCL 2 MG/2ML IJ SOLN
INTRAMUSCULAR | Status: AC
  Filled 2019-10-11: qty 2

## 2019-10-11 MED ORDER — FENTANYL CITRATE (PF) 100 MCG/2ML IJ SOLN
INTRAMUSCULAR | Status: AC
  Filled 2019-10-11: qty 2

## 2019-10-11 MED ORDER — CHLORHEXIDINE GLUCONATE CLOTH 2 % EX PADS: 6.0000 | MEDICATED_PAD | Freq: Once | CUTANEOUS | Status: DC

## 2019-10-11 MED ORDER — OXYCODONE HCL 5 MG/5ML PO SOLN: 5.0000 mg | Freq: Once | ORAL | Status: DC | PRN

## 2019-10-11 MED ORDER — DEXAMETHASONE SODIUM PHOSPHATE 10 MG/ML IJ SOLN
INTRAMUSCULAR | Status: DC | PRN
  Administered 2019-10-11: 5 mg via INTRAVENOUS

## 2019-10-11 MED ORDER — ACETAMINOPHEN 500 MG PO TABS
ORAL_TABLET | ORAL | Status: AC
  Filled 2019-10-11: qty 2

## 2019-10-11 MED ORDER — FENTANYL CITRATE (PF) 250 MCG/5ML IJ SOLN
INTRAMUSCULAR | Status: DC | PRN
Start: 2019-10-11 — End: 2019-10-11
  Administered 2019-10-11 (×2): 25 ug via INTRAVENOUS

## 2019-10-11 MED ORDER — LIDOCAINE HCL (CARDIAC) PF 100 MG/5ML IV SOSY
PREFILLED_SYRINGE | INTRAVENOUS | Status: DC | PRN
  Administered 2019-10-11: 60 mg via INTRATRACHEAL

## 2019-10-11 MED ORDER — ONDANSETRON HCL 4 MG/2ML IJ SOLN
INTRAMUSCULAR | Status: DC | PRN
  Administered 2019-10-11: 4 mg via INTRAVENOUS

## 2019-10-11 MED ORDER — MEPERIDINE HCL 25 MG/ML IJ SOLN: 6.2500 mg | INTRAMUSCULAR | Status: DC | PRN

## 2019-10-11 MED ORDER — CEFAZOLIN SODIUM-DEXTROSE 2-4 GM/100ML-% IV SOLN
2.0000 g | INTRAVENOUS | Status: AC
  Administered 2019-10-11: 2 g via INTRAVENOUS

## 2019-10-11 MED ORDER — DEXAMETHASONE SODIUM PHOSPHATE 10 MG/ML IJ SOLN
INTRAMUSCULAR | Status: AC
  Filled 2019-10-11: qty 1

## 2019-10-11 MED ORDER — CEFAZOLIN SODIUM-DEXTROSE 2-4 GM/100ML-% IV SOLN
INTRAVENOUS | Status: AC
  Filled 2019-10-11: qty 100

## 2019-10-11 MED ORDER — BUPIVACAINE HCL 0.5 % IJ SOLN
INTRAMUSCULAR | Status: DC | PRN
  Administered 2019-10-11: 16 mL

## 2019-10-11 MED ORDER — MIDAZOLAM HCL 2 MG/2ML IJ SOLN
INTRAMUSCULAR | Status: DC | PRN
  Administered 2019-10-11: 2 mg via INTRAVENOUS

## 2019-10-11 MED ORDER — HEPARIN SOD (PORK) LOCK FLUSH 100 UNIT/ML IV SOLN
INTRAVENOUS | Status: DC | PRN
  Administered 2019-10-11: 500 [IU]

## 2019-10-11 MED ORDER — LACTATED RINGERS IV SOLN: INTRAVENOUS | Status: DC

## 2019-10-11 SURGICAL SUPPLY — 42 items
ADH SKN CLS APL DERMABOND .7 (GAUZE/BANDAGES/DRESSINGS) ×1
APL PRP STRL LF DISP 70% ISPRP (MISCELLANEOUS) ×1
BAG DECANTER FOR FLEXI CONT (MISCELLANEOUS) ×2 IMPLANT
BLADE HEX COATED 2.75 (ELECTRODE) ×2 IMPLANT
BLADE SURG 11 STRL SS (BLADE) ×2 IMPLANT
BLADE SURG 15 STRL LF DISP TIS (BLADE) ×1 IMPLANT
BLADE SURG 15 STRL SS (BLADE) ×2
CHLORAPREP W/TINT 26 (MISCELLANEOUS) ×2 IMPLANT
COVER BACK TABLE 60X90IN (DRAPES) ×2 IMPLANT
COVER MAYO STAND STRL (DRAPES) ×2 IMPLANT
COVER WAND RF STERILE (DRAPES) IMPLANT
DECANTER SPIKE VIAL GLASS SM (MISCELLANEOUS) IMPLANT
DERMABOND ADVANCED (GAUZE/BANDAGES/DRESSINGS) ×1
DERMABOND ADVANCED .7 DNX12 (GAUZE/BANDAGES/DRESSINGS) ×1 IMPLANT
DRAPE C-ARM 42X72 X-RAY (DRAPES) ×2 IMPLANT
DRAPE LAPAROTOMY TRNSV 102X78 (DRAPES) ×2 IMPLANT
DRAPE UTILITY XL STRL (DRAPES) ×2 IMPLANT
DRSG TEGADERM 4X4.75 (GAUZE/BANDAGES/DRESSINGS) IMPLANT
ELECT REM PT RETURN 9FT ADLT (ELECTROSURGICAL) ×2
ELECTRODE REM PT RTRN 9FT ADLT (ELECTROSURGICAL) ×1 IMPLANT
GAUZE SPONGE 4X4 12PLY STRL LF (GAUZE/BANDAGES/DRESSINGS) IMPLANT
GLOVE BIO SURGEON STRL SZ 6 (GLOVE) ×2 IMPLANT
GLOVE BIOGEL PI IND STRL 6.5 (GLOVE) ×1 IMPLANT
GLOVE BIOGEL PI INDICATOR 6.5 (GLOVE) ×1
GOWN STRL REUS W/ TWL LRG LVL3 (GOWN DISPOSABLE) ×1 IMPLANT
GOWN STRL REUS W/TWL 2XL LVL3 (GOWN DISPOSABLE) ×2 IMPLANT
GOWN STRL REUS W/TWL LRG LVL3 (GOWN DISPOSABLE) ×2
IV CONNECTOR ONE LINK NDLESS (IV SETS) IMPLANT
KIT PORT POWER 8FR ISP CVUE (Port) ×2 IMPLANT
NEEDLE HYPO 25X1 1.5 SAFETY (NEEDLE) ×2 IMPLANT
PACK BASIN DAY SURGERY FS (CUSTOM PROCEDURE TRAY) ×2 IMPLANT
PENCIL SMOKE EVACUATOR (MISCELLANEOUS) ×2 IMPLANT
SLEEVE SCD COMPRESS KNEE MED (MISCELLANEOUS) ×2 IMPLANT
SUT MNCRL AB 4-0 PS2 18 (SUTURE) ×2 IMPLANT
SUT PROLENE 2 0 SH DA (SUTURE) ×4 IMPLANT
SUT VIC AB 3-0 SH 27 (SUTURE) ×2
SUT VIC AB 3-0 SH 27X BRD (SUTURE) ×1 IMPLANT
SUT VICRYL 3-0 CR8 SH (SUTURE) IMPLANT
SYR 10ML LL (SYRINGE) ×2 IMPLANT
SYR 5ML LUER SLIP (SYRINGE) ×2 IMPLANT
SYR CONTROL 10ML LL (SYRINGE) ×2 IMPLANT
TOWEL GREEN STERILE FF (TOWEL DISPOSABLE) ×2 IMPLANT

## 2019-10-11 NOTE — Interval H&P Note (Signed)
History and Physical Interval Note:  10/11/2019 7:30 AM  Cynthia Frye  has presented today for surgery, with the diagnosis of PANCREATIC CANCER.  The various methods of treatment have been discussed with the patient and family. After consideration of risks, benefits and other options for treatment, the patient has consented to  Procedure(s): INSERTION PORT-A-CATH (N/A) as a surgical intervention.  The patient's history has been reviewed, patient examined, no change in status, stable for surgery.  I have reviewed the patient's chart and labs.  Questions were answered to the patient's satisfaction.     [name]

## 2019-10-11 NOTE — Progress Notes (Signed)
I left patient a voice message regarding the PET scan originally scheduled for Monday 9/20 has been cancelled due to her insurance no approving this.  Dr. Burr Medico has ordered a CT of the chest to done instead and this is scheduled for Monday 9/20 to arrive at [facility] at 7:15. No prep required. I asked her to call me back if she should have any questions regarding this.

## 2019-10-11 NOTE — Anesthesia Procedure Notes (Signed)
Procedure Name: LMA Insertion Date/Time: 10/11/2019 8:09 AM Performed by: Kathryne Hitch, CRNA Pre-anesthesia Checklist: Patient identified, Emergency Drugs available, Suction available and Patient being monitored Patient Re-evaluated:Patient Re-evaluated prior to induction Oxygen Delivery Method: Circle system utilized Preoxygenation: Pre-oxygenation with 100% oxygen Induction Type: IV induction Ventilation: Mask ventilation without difficulty LMA: LMA inserted LMA Size: 4.0 Number of attempts: 1 Placement Confirmation: positive ETCO2 and breath sounds checked- equal and bilateral Tube secured with: Tape Dental Injury: Teeth and Oropharynx as per pre-operative assessment

## 2019-10-11 NOTE — Anesthesia Preprocedure Evaluation (Signed)
Anesthesia Evaluation  Patient identified by MRN, date of birth, ID band Patient awake    Reviewed: Allergy & Precautions, H&P , NPO status , Patient's Chart, lab work & pertinent test results  Airway Mallampati: II  TM Distance: >3 FB Neck ROM: Full    Dental  (+) Teeth Intact, Dental Advisory Given   Pulmonary asthma ,    Pulmonary exam normal breath sounds clear to auscultation       Cardiovascular hypertension, Pt. on medications negative cardio ROS Normal cardiovascular exam+ Valvular Problems/Murmurs  Rhythm:Regular Rate:Normal     Neuro/Psych  Headaches, Seizures -,  negative psych ROS   GI/Hepatic negative GI ROS, Neg liver ROS,   Endo/Other  negative endocrine ROS  Renal/GU negative Renal ROS  negative genitourinary   Musculoskeletal  (+) Arthritis , Osteoarthritis,    Abdominal   Peds  Hematology negative hematology ROS (+)   Anesthesia Other Findings Pancreatic Cancer  Reproductive/Obstetrics negative OB ROS                             Anesthesia Physical  Anesthesia Plan  ASA: III  Anesthesia Plan: General   Post-op Pain Management:    Induction: Intravenous  PONV Risk Score and Plan: 3 and Ondansetron, Dexamethasone, Midazolam and Treatment may vary due to age or medical condition  Airway Management Planned: LMA  Additional Equipment:   Intra-op Plan:   Post-operative Plan: Extubation in OR  Informed Consent: I have reviewed the patients History and Physical, chart, labs and discussed the procedure including the risks, benefits and alternatives for the proposed anesthesia with the patient or authorized representative who has indicated his/her understanding and acceptance.     Dental advisory given  Plan Discussed with: CRNA  Anesthesia Plan Comments:         Anesthesia Quick Evaluation

## 2019-10-11 NOTE — Anesthesia Postprocedure Evaluation (Signed)
Anesthesia Post Note  Patient: Cynthia Frye  Procedure(s) Performed: INSERTION PORT-A-CATH LEFT SUBCLAVIAN (N/A Chest)     Patient location during evaluation: PACU Anesthesia Type: General Level of consciousness: awake and alert Pain management: pain level controlled Vital Signs Assessment: post-procedure vital signs reviewed and stable Respiratory status: spontaneous breathing, nonlabored ventilation and respiratory function stable Cardiovascular status: blood pressure returned to baseline and stable Postop Assessment: no apparent nausea or vomiting Anesthetic complications: no   No complications documented.  Last Vitals:  Vitals:   10/11/19 0945 10/11/19 0954  BP: 125/75   Pulse: 70 70  Resp: 15 16  Temp:    SpO2: 100% 100%    Last Pain:  Vitals:   10/11/19 0954  TempSrc:   PainSc: [place]

## 2019-10-11 NOTE — Discharge Instructions (Addendum)
[place] Office Phone Number [phone]   POST OP INSTRUCTIONS  Always review your discharge instruction sheet given to you by the facility where your surgery was performed.  IF YOU HAVE DISABILITY OR FAMILY LEAVE FORMS, YOU MUST BRING THEM TO THE OFFICE FOR PROCESSING.  DO NOT GIVE THEM TO YOUR DOCTOR.  1. A prescription for pain medication may be given to you upon discharge.  Take your pain medication as prescribed, if needed.  If narcotic pain medicine is not needed, then you may take acetaminophen (Tylenol) or ibuprofen (Advil) as needed. 2. Take your usually prescribed medications unless otherwise directed 3. If you need a refill on your pain medication, please contact your pharmacy.  They will contact our office to request authorization.  Prescriptions will not be filled after 5pm or on week-ends. 4. You should eat very light the first 24 hours after surgery, such as soup, crackers, pudding, etc.  Resume your normal diet the day after surgery 5. It is common to experience some constipation if taking pain medication after surgery.  Increasing fluid intake and taking a stool softener will usually help or prevent this problem from occurring.  A mild laxative (Milk of Magnesia or Miralax) should be taken according to package directions if there are no bowel movements after 48 hours. 6. You may shower in 48 hours.  The surgical glue will flake off in 2-3 weeks.   7. ACTIVITIES:  No strenuous activity or heavy lifting for 1 week.   a. You may drive when you no longer are taking prescription pain medication, you can comfortably wear a seatbelt, and you can safely maneuver your car and apply brakes. b. RETURN TO WORK:  __________to be determined_______________ Dennis Bast should see your doctor in the office for a follow-up appointment approximately three-four weeks after your surgery.    WHEN TO CALL YOUR DOCTOR: 1. Fever over 101.0 2. Nausea and/or vomiting. 3. Extreme swelling or  bruising. 4. Continued bleeding from incision. 5. Increased pain, redness, or drainage from the incision.  The clinic staff is available to answer your questions during regular business hours.  Please don't hesitate to call and ask to speak to one of the nurses for clinical concerns.  If you have a medical emergency, go to the nearest emergency room or call 911.  A surgeon from [facility] is always on call at the hospital.  For further questions, please visit [email or web address]    NO TYLENOL PRODUCTS UNTIL 1:00 PM   Post Anesthesia Home Care Instructions  Activity: Get plenty of rest for the remainder of the day. A responsible individual must stay with you for 24 hours following the procedure.  For the next 24 hours, DO NOT: -Drive a car -Paediatric nurse -Drink alcoholic beverages -Take any medication unless instructed by your physician -Make any legal decisions or sign important papers.  Meals: Start with liquid foods such as gelatin or soup. Progress to regular foods as tolerated. Avoid greasy, spicy, heavy foods. If nausea and/or vomiting occur, drink only clear liquids until the nausea and/or vomiting subsides. Call your physician if vomiting continues.  Special Instructions/Symptoms: Your throat may feel dry or sore from the anesthesia or the breathing tube placed in your throat during surgery. If this causes discomfort, gargle with warm salt water. The discomfort should disappear within 24 hours.  If you had a scopolamine patch placed behind your ear for the management of post- operative nausea and/or vomiting:  1. The medication in the patch  is effective for 72 hours, after which it should be removed.  Wrap patch in a tissue and discard in the trash. Wash hands thoroughly with soap and water. 2. You may remove the patch earlier than 72 hours if you experience unpleasant side effects which may include dry mouth, dizziness or visual disturbances. 3. Avoid  touching the patch. Wash your hands with soap and water after contact with the patch.

## 2019-10-11 NOTE — Transfer of Care (Signed)
Immediate Anesthesia Transfer of Care Note  Patient: Cynthia Frye  Procedure(s) Performed: INSERTION PORT-A-CATH LEFT SUBCLAVIAN (N/A Chest)  Patient Location: PACU  Anesthesia Type:General  Level of Consciousness: drowsy and patient cooperative  Airway & Oxygen Therapy: Patient Spontanous Breathing and Patient connected to face mask oxygen  Post-op Assessment: Report given to RN and Post -op Vital signs reviewed and stable  Post vital signs: Reviewed and stable  Last Vitals:  Vitals Value Taken Time  BP 131/78 10/11/19 0930  Temp    Pulse 73 10/11/19 0935  Resp 17 10/11/19 0935  SpO2 100 % 10/11/19 0935  Vitals shown include unvalidated device data.  Last Pain:  Vitals:   10/11/19 0915  TempSrc:   PainSc: Asleep         Complications: No complications documented.

## 2019-10-11 NOTE — Op Note (Signed)
PREOPERATIVE DIAGNOSIS:  Pancreatic      POSTOPERATIVE DIAGNOSIS:  Same     PROCEDURE: left subclavian port placement, Bard ClearVue  Power Port, MRI safe, 8-French.      SURGEON:  [name], MD      ANESTHESIA:  General   FINDINGS:  Good venous return, easy flush, and tip of the catheter and   SVC 23 cm.      SPECIMEN:  None.      ESTIMATED BLOOD LOSS:  Minimal.      COMPLICATIONS:  None known.      PROCEDURE:  Pt was identified in the holding area and taken to   the operating room, where patient was placed supine on the operating room   table.  General anesthesia was induced.  Patient's arms were tucked and the upper   chest and neck were prepped and draped in sterile fashion.  Time-out was   performed according to the surgical safety check list.  When all was   correct, we continued.   Local anesthetic was administered over this   area at the angle of the clavicle.  The vein was accessed with 3 pass(es) of the needle. There was good venous return and the wire passed easily with no ectopy.   Fluoroscopy was used to confirm that the wire was in the vena cava.      The patient was placed back level and the area for the pocket was anethetized   with local anesthetic.  A 3-cm transverse incision was made with a #15   blade.  Cautery was used to divide the subcutaneous tissues down to the   pectoralis muscle.  An [employer] retractor was used to elevate the skin   while a pocket was created on top of the pectoralis fascia.  The port   was placed into the pocket to confirm that it was of adequate size.  The   catheter was preattached to the port.  The port was then secured to the   pectoralis fascia with four 2-0 Prolene sutures.  These were clamped and   not tied down yet.    The catheter was tunneled through to the wire exit   site.  The catheter was placed along the wire to determine what length it should be to be in the SVC.  The catheter was cut at 23 cm.  The tunneler  sheath and dilator were passed over the wire and the dilator and wire were removed.  The catheter was advanced through the tunneler sheath and the tunneler sheath was pulled away.  Care was taken to keep the catheter in the tunneler sheath as this occurred. This was advanced and the tunneler sheath was removed.  There was good venous   return and easy flush of the catheter.  The Prolene sutures were tied   down to the pectoral fascia.  The skin was reapproximated using 3-0   Vicryl interrupted deep dermal sutures.    Fluoroscopy was used to re-confirm good position of the catheter.  The skin   was then closed using 4-0 Monocryl in a subcuticular fashion.  The port was flushed with concentrated heparin flush as well.  The wounds were then cleaned, dried, and dressed with Dermabond.  The patient was awakened from anesthesia and taken to the PACU in stable condition.  Needle, sponge, and instrument counts were correct.               [name], MD

## 2019-10-12 NOTE — Addendum Note (Signed)
Addendum  created 10/12/19 1247 by Tawni Millers, CRNA   Charge Capture section accepted

## 2019-10-15 ENCOUNTER — Encounter (HOSPITAL_COMMUNITY): Payer: Self-pay

## 2019-10-15 ENCOUNTER — Encounter (HOSPITAL_COMMUNITY): Payer: BC Managed Care – PPO

## 2019-10-15 ENCOUNTER — Ambulatory Visit (HOSPITAL_COMMUNITY)
Admission: RE | Admit: 2019-10-15 | Discharge: 2019-10-15 | Disposition: A | Discharge status: Home / Self Care | Payer: BC Managed Care – PPO | Source: Home / Self Care | Attending: Hematology | Admitting: Hematology

## 2019-10-15 ENCOUNTER — Telehealth: Payer: Self-pay

## 2019-10-15 ENCOUNTER — Other Ambulatory Visit: Payer: Self-pay

## 2019-10-15 DIAGNOSIS — Z90710 Acquired absence of both cervix and uterus: Secondary | ICD-10-CM | POA: Diagnosis not present

## 2019-10-15 DIAGNOSIS — Z79899 Other long term (current) drug therapy: Secondary | ICD-10-CM | POA: Diagnosis not present

## 2019-10-15 DIAGNOSIS — K8689 Other specified diseases of pancreas: Secondary | ICD-10-CM | POA: Diagnosis not present

## 2019-10-15 DIAGNOSIS — J45909 Unspecified asthma, uncomplicated: Secondary | ICD-10-CM | POA: Diagnosis not present

## 2019-10-15 DIAGNOSIS — M329 Systemic lupus erythematosus, unspecified: Secondary | ICD-10-CM | POA: Diagnosis not present

## 2019-10-15 DIAGNOSIS — M1990 Unspecified osteoarthritis, unspecified site: Secondary | ICD-10-CM | POA: Diagnosis not present

## 2019-10-15 DIAGNOSIS — Z7982 Long term (current) use of aspirin: Secondary | ICD-10-CM | POA: Diagnosis not present

## 2019-10-15 DIAGNOSIS — C259 Malignant neoplasm of pancreas, unspecified: Secondary | ICD-10-CM | POA: Diagnosis not present

## 2019-10-15 DIAGNOSIS — I10 Essential (primary) hypertension: Secondary | ICD-10-CM | POA: Diagnosis not present

## 2019-10-15 DIAGNOSIS — C250 Malignant neoplasm of head of pancreas: Secondary | ICD-10-CM

## 2019-10-15 DIAGNOSIS — Z7951 Long term (current) use of inhaled steroids: Secondary | ICD-10-CM | POA: Diagnosis not present

## 2019-10-15 DIAGNOSIS — G43909 Migraine, unspecified, not intractable, without status migrainosus: Secondary | ICD-10-CM | POA: Diagnosis not present

## 2019-10-15 DIAGNOSIS — R011 Cardiac murmur, unspecified: Secondary | ICD-10-CM | POA: Diagnosis not present

## 2019-10-15 DIAGNOSIS — R569 Unspecified convulsions: Secondary | ICD-10-CM | POA: Diagnosis not present

## 2019-10-15 DIAGNOSIS — J9811 Atelectasis: Secondary | ICD-10-CM | POA: Diagnosis not present

## 2019-10-15 DIAGNOSIS — J939 Pneumothorax, unspecified: Secondary | ICD-10-CM | POA: Diagnosis not present

## 2019-10-15 DIAGNOSIS — Z888 Allergy status to other drugs, medicaments and biological substances status: Secondary | ICD-10-CM | POA: Diagnosis not present

## 2019-10-15 NOTE — Telephone Encounter (Signed)
Called to schedule counseling but left message to call back.

## 2019-10-15 NOTE — Telephone Encounter (Signed)
I called patient and scheduled counseling appointment for her and husband 9/21 at 1 pm.

## 2019-10-15 NOTE — Progress Notes (Signed)
[place]   [phone] [phone]   Clinic Follow up Note   Patient Care Team: Josetta Huddle, MD as PCP - General (Internal Medicine) Jonnie Finner, RN as Oncology Nurse Navigator Truitt Merle, MD as Consulting Physician (Oncology) Arta Silence, MD as Consulting Physician (Gastroenterology) [name], MD as Consulting Physician (General Surgery)  Date of Service:  10/18/2019  CHIEF COMPLAINT: f/u of Pancreatic Cancer   SUMMARY OF ONCOLOGIC HISTORY: Oncology History Overview Note  Cancer Staging Pancreatic cancer [facility]) Staging form: Exocrine Pancreas, AJCC 8th Edition - Clinical stage from 10/18/2019: Stage IV (cT3, cN1, cM1) - Signed by Truitt Merle, MD on 10/18/2019    Pancreatic cancer ([place])  09/19/2019 Imaging   CT AP w contrast 09/19/19  IMPRESSION: 1. Findings are highly concerning for probable primary pancreatic adenocarcinoma in the anterior aspect of the pancreatic head. Several prominent borderline enlarged lymph nodes are noted in the hepatoduodenal nodal station, and there are multiple indeterminate liver lesions which are highly concerning for probable hepatic metastases. Further evaluation with nonemergent abdominal MRI with and without IV gadolinium with MRCP is recommended in the near future to better evaluate these findings.   09/25/2019 Procedure   Upper EUS by Dr Paulita Fujita  IMPRESSION -There was no evidence of significant pathology in the left lobe of the liver.  -A few lymph nodes were visualized and measures in the peripancreatic region and porta hepata region.  -Hyperchoic material consistent with sludge was visualized endosonographically in the gallbladder.  -There was no sign of significant pathology in the common bile duct.  -A mass was identified in the pancreatic head. If biopsy results show adenocarcinoma, it would be staged T3N1Mx by endosonographic criteria. Fine needle aspiration performed.    09/25/2019  Initial Biopsy   FINAL MICROSCOPIC DIAGNOSIS: Fine needle aspirate, Pancreas;  MALIGNANT CELLS PRESENT CONSISTENT WITH ADENOCARCINOMA.    10/02/2019 Initial Diagnosis   Pancreatic cancer ([place])   10/11/2019 Procedure   PAC placement y Dr Barry Dienes    10/15/2019 Imaging   CT Chest  IMPRESSION: 1. Small anterior left pneumothorax with dependent atelectasis in the left lower lobe. 2. Increased number of bilateral axillary and subpectoral lymph nodes with mild lymphadenopathy in the left axilla. While this would be an atypical presentation for metastatic pancreatic cancer, this possibility is not excluded 3. Main duct dilatation in the pancreas, better assessed on abdomen CT 09/19/2019.   10/18/2019 Cancer Staging   Staging form: Exocrine Pancreas, AJCC 8th Edition - Clinical stage from 10/18/2019: Stage IV (cT3, cN1, cM1) - Signed by Truitt Merle, MD on 10/18/2019      CURRENT THERAPY:  Pending first line chemo FOLFIRINOX   INTERVAL HISTORY:  Cynthia Frye is here for a follow up. She presents to the clinic with her husband.  She is clinically stable, underwent port placement last week on left upper chest Developed mild cough, possible allergy. Not able to lay flat for long time due to discomfort at port side  Left side low chest pain since yesterday at rib cage, overall mild and tolerable  Mild SOB, (+) heart burn  Insomnia, only sleep 3-4 hour at night  ROS otherwise negative.   MEDICAL HISTORY:  Past Medical History:  Diagnosis Date  . Allergies    peanuts, corn, beans, red grapefruit, naval oranges  . Asthma    allergy shots and medication  . DDD (degenerative disc disease), lumbar   . Eustachian tube dysfunction 12/2012   rhinitis, vertigo- Dr. Wilburn Cornelia, ENT   .  Fibroids   . Heart murmur    Echo 1/18: EF 55-60, no RWMA, normal diastolic function, trivial AI, PASP 32  . History of cardiac catheterization    LHC 3/18: normal coronary arteries.   . History of nuclear  stress test    ETT-Myoview 2/18: EF 62, + ECG response; apical and distal septal ischemia; intermediate risk.  Marland Kitchen Hypertension   . Iron deficiency anemia   . Lupus [facility]) 2011   Dr. Trudie Reed  . Migraine headache    trial of generic maxalt 10 mg, January 2021  . Obesity   . Seasonal allergies   . Seizure in childhood [facility])    as a child no treatment none x 30 years    SURGICAL HISTORY: Past Surgical History:  Procedure Laterality Date  . ABDOMINAL HYSTERECTOMY    . BACK SURGERY  2009  . COLONOSCOPY WITH PROPOFOL N/A 04/11/2012   Procedure: COLONOSCOPY WITH PROPOFOL;  Surgeon: Garlan Fair, MD;  Location: [facility] ENDOSCOPY;  Service: Endoscopy;  Laterality: N/A;  . HERNIA REPAIR     as child unbilical  . A5-W partial discectomy and laminectomy     Dr. Christella Noa  . LEFT HEART CATH AND CORONARY ANGIOGRAPHY N/A 04/09/2016   Procedure: Left Heart Cath and Coronary Angiography;  Surgeon: Nelva Bush, MD;  Location: [place] CV LAB;  Service: Cardiovascular;  Laterality: N/A;  . MOUTH SURGERY    . PORTACATH PLACEMENT N/A 10/11/2019   Procedure: INSERTION PORT-A-CATH LEFT SUBCLAVIAN;  Surgeon: [name], MD;  Location: [place];  Service: General;  Laterality: N/A;    I have reviewed the social history and family history with the patient and they are unchanged from previous note.  ALLERGIES:  is allergic to cinnamon, peanut-containing drug products, corn oil, corn-containing products, and other.  MEDICATIONS:  Current Outpatient Medications  Medication Sig Dispense Refill  . albuterol (PROVENTIL HFA;VENTOLIN HFA) 108 (90 Base) MCG/ACT inhaler Inhale 2 puffs into the lungs every 6 (six) hours as needed for wheezing or shortness of breath. 18 g 0  . aspirin 81 MG tablet Take 81 mg by mouth daily.    . Bepotastine Besilate (BEPREVE) 1.5 % SOLN Place 1 drop into both eyes 2 (two) times daily as needed (DRY EYES).    . cetirizine (ZYRTEC) 10 MG chewable tablet Chew 10 mg  by mouth daily.    . Cholecalciferol (VITAMIN D) 2000 units CAPS Take 2,000 Units by mouth daily.     . Dietary Management Product (RHEUMATE) CAPS Take 1 capsule by mouth daily. [place]    . DIPROLENE 0.05 % ointment Apply 1 application topically as needed (SKIN IRRITATION).     Marland Kitchen esomeprazole (NEXIUM) 10 MG packet Take 10 mg by mouth daily before breakfast.    . ferrous sulfate 325 (65 FE) MG tablet Take 325 mg by mouth daily with breakfast.    . Flaxseed, Linseed, (FLAXSEED OIL) 1000 MG CAPS Take 1,000 mg by mouth daily.    . Fluocinolone Acetonide Scalp 0.01 % OIL Apply 1 application topically daily as needed for itching.    . fluticasone (FLONASE) 50 MCG/ACT nasal spray Place 2 sprays into both nostrils daily. 16 g 12  . hydroxychloroquine (PLAQUENIL) 200 MG tablet Take 400 mg by mouth daily.   5  . losartan-hydrochlorothiazide (HYZAAR) 50-12.5 MG tablet Take 1 tablet by mouth daily.     . metoprolol succinate (TOPROL-XL) 50 MG 24 hr tablet Take 50 mg by mouth  daily. Take with or immediately following a meal.     . oxyCODONE (OXY IR/ROXICODONE) 5 MG immediate release tablet Take 1 tablet (5 mg total) by mouth every 6 (six) hours as needed for severe pain. 8 tablet 0  . rizatriptan (MAXALT-MLT) 10 MG disintegrating tablet Take 1 tablet (10 mg total) by mouth as needed for migraine. May repeat in 2 hours if needed 9 tablet 11  . topiramate (TOPAMAX) 50 MG tablet Take 75mg  to 100mg  (1.5 - 2 tablets) daily at bedtime for headaches 180 tablet 3  . ALPRAZolam (XANAX) 0.25 MG tablet Take 1 tablet (0.25 mg total) by mouth at bedtime as needed for anxiety. 20 tablet 0  . diclofenac (VOLTAREN) 75 MG EC tablet Take 75 mg by mouth 2 (two) times daily.    . methotrexate 50 MG/2ML injection Inject 25 mg into the skin once a week. 74mL = 25mg  (Patient not taking: Reported on 10/18/2019)  3   No current facility-administered medications for this visit.    PHYSICAL EXAMINATION: ECOG  PERFORMANCE STATUS: 1 - Symptomatic but completely ambulatory  Vitals:   10/18/19 1029 10/18/19 1235  BP: 128/84   Pulse: (!) 128   Resp: 18   Temp: (!) 100.5 F (38.1 C) 99.5 F (37.5 C)  SpO2: 100%    Filed Weights   10/18/19 1029  Weight: 187 lb 12.8 oz (85.2 kg)   GENERAL:alert, no distress and comfortable SKIN: skin color, texture, turgor are normal, no rashes or significant lesions EYES: normal, Conjunctiva are pink and non-injected, sclera clear NECK: supple, thyroid normal size, non-tender, without nodularity LYMPH:  no palpable lymphadenopathy in the cervical, axillary  LUNGS: clear to auscultation and percussion with normal breathing effort HEART: regular rate & rhythm and no murmurs and no lower extremity edema ABDOMEN:abdomen soft, non-tender and normal bowel sounds Musculoskeletal:no cyanosis of digits and no clubbing  NEURO: alert & oriented x 3 with fluent speech, no focal motor/sensory deficits  LABORATORY DATA:  I have reviewed the data as listed CBC Latest Ref Rng & Units 04/22/2019 02/22/2018 04/02/2016  WBC 4.0 - 10.5 K/uL 3.3(L) 2.8(L) 2.5(LL)  Hemoglobin 12.0 - 15.0 g/dL 13.2 11.7(L) 12.5  Hematocrit 36 - 46 % 39.4 35.2(L) 36.5  Platelets 150 - 400 K/uL 197 195 211     CMP Latest Ref Rng & Units 04/22/2019 02/22/2018 04/02/2016  Glucose 70 - 99 mg/dL 78 94 89  BUN 6 - 20 mg/dL 17 18 10   Creatinine 0.44 - 1.00 mg/dL 0.89 0.88 0.75  Sodium 135 - 145 mmol/L 142 139 142  Potassium 3.5 - 5.1 mmol/L 3.3(L) 4.2 4.2  Chloride 98 - 111 mmol/L 106 106 104  CO2 22 - 32 mmol/L 26 26 24   Calcium 8.9 - 10.3 mg/dL 10.4(H) 9.5 10.0  Total Protein 6.5 - 8.1 g/dL 8.6(H) - -  Total Bilirubin 0.3 - 1.2 mg/dL 0.7 - -  Alkaline Phos 38 - 126 U/L 71 - -  AST 15 - 41 U/L 26 - -  ALT 0 - 44 U/L 25 - -      RADIOGRAPHIC STUDIES: I have personally reviewed the radiological images as listed and agreed with the findings in the report. No results found.   ASSESSMENT & PLAN:   Cynthia Frye is a 60 y.o. female with    1. 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 Her EUS biopsy with Dr Paulita Fujita on 09/25/19 shows pancreatic adenocarcinoma.  -Her CT Scan also shows several subcentimeter liver lesions suspicious for metastasis, also technically indeterminate.  -I reviewed her images of CT chest from 10/15/19 and MRI abdomen from 10/16/19 with pt and her husband in person.  Scan showed multiple left axillary and subpectoral adenopathy, likely related to her recent Covid injection in left upper arm.  Plan to get diagnostic mammogram and left breast and axillary ultrasound for further evaluation.  If no breast mass, will likely observe adenopathy. -Her abdominal MRI showed multiple (>10) small liver lesions, most are consistent with metastatic lesions.  We reviewed in GI tumor conference yesterday, IR can try ultrasound-guided liver biopsy.  I recommend her to get a biopsy for tissue confirmation, and also for molecular testing such as [facility] One.  Potential risks from liver biopsy discussed with patient, especially bleeding, pain, infection, etc.  Patient would like to have some time to think about it, and she will call me back tomorrow with her decision. -We discussed that her cancer is not curable if biopsy confirmed metastatic disease in liver, but is still treatable, and the goal of therapy is palliative to prolong her life -I recommend systemic chemotherapy with first-line FOLFIRINOX, she directory young and fit, would be a candidate for intensive chemotherapy.  I also discussed the option of gemcitabine and Abraxane.  The logistics, potential side effect were discussed her in detail.  She agrees with FOLFIRINOX. --Chemotherapy consent: Side effects including but does not not limited to, fatigue, nausea, vomiting, diarrhea, hair loss,  cold sensitivity and neuropathy, fluid retention, renal and kidney dysfunction, neutropenic fever, needed for blood transfusion, bleeding, were discussed with patient in great detail. She agrees to proceed. -The goal of therapy is palliative -Start chemotherapy next week, will schedule chemo class first.   2. Upper abdominal cramps, Weight loss -Secondary to #1  -She has had intermittent upper abdominal cramping after certain meals. She also has had a gradual 40 pounds weight loss over 7 months (since 03/2019) -Her appetite is otherwise stable. Continue to f/u with dietician.  -For pain Dr Inda Merlin gave her Hydrocodone, but not taken much so far.    3. Comorbidities: Lupus, HTN, Migraines  -Symptomatic in her joints, Dx many years ago  -She is being followed by Rheumatologist Dr Wyline Copas and being treated with weekly Methotrexate injections, Plaquenil and Diclofenac. -For Migraines is well controlled on Topomax. She has been seen by Dr Jaynee Eagles.   4. Left small pneumothorax -Secondary to port placement -We will repeat a chest x-ray today, she is not very symptomatic, I doubt she needs any intervention.  5. Anxiety, Social Support  -She has been very anxious with the fast pace workup and unexpected diagnosis. -She lives with her husband in [place]. She does not have children. She does have a brother who lives close by but no other near relatives. -She plans to apply for disability -We will refer her to Education officer, museum for support    PLAN:  -scan reviewed  -Bilateral diagnostic mammogram and left breast ultrasound next week -I recommend ultrasound-guided liver biopsy, patient will think about it and call me back tomorrow with her decision -chemo class early next week -Plan to start chemo therapy with FOLFIRINOX in a week, lab and f/u before chemo -SW referral for support    No problem-specific Assessment & Plan notes found for this encounter.   No orders of the defined types were  placed in  this encounter.  All questions were answered. The patient knows to call the clinic with any problems, questions or concerns. No barriers to learning was detected. The total time spent in the appointment was 60 minutes.     Truitt Merle, MD 10/18/2019   I, Joslyn Devon, am acting as scribe for Truitt Merle, MD.   I have reviewed the above documentation for accuracy and completeness, and I agree with the above.

## 2019-10-16 ENCOUNTER — Other Ambulatory Visit: Payer: Self-pay | Admitting: Hematology

## 2019-10-16 ENCOUNTER — Ambulatory Visit (HOSPITAL_COMMUNITY)
Admission: RE | Admit: 2019-10-16 | Discharge: 2019-10-16 | Disposition: A | Discharge status: Home / Self Care | Payer: BC Managed Care – PPO | Source: Ambulatory Visit | Attending: Hematology | Admitting: Hematology

## 2019-10-16 ENCOUNTER — Telehealth: Payer: Self-pay

## 2019-10-16 DIAGNOSIS — C250 Malignant neoplasm of head of pancreas: Secondary | ICD-10-CM | POA: Diagnosis not present

## 2019-10-16 DIAGNOSIS — K7689 Other specified diseases of liver: Secondary | ICD-10-CM | POA: Diagnosis not present

## 2019-10-16 DIAGNOSIS — R935 Abnormal findings on diagnostic imaging of other abdominal regions, including retroperitoneum: Secondary | ICD-10-CM | POA: Diagnosis not present

## 2019-10-16 MED ORDER — GADOBUTROL 1 MMOL/ML IV SOLN
8.0000 mL | Freq: Once | INTRAVENOUS | Status: AC | PRN
  Administered 2019-10-16: 8 mL via INTRAVENOUS

## 2019-10-16 NOTE — Telephone Encounter (Signed)
I left vm for Cynthia Frye to call me.

## 2019-10-16 NOTE — Progress Notes (Signed)
[place] Patient and [facility] [place] Counseling Note    Patient and husband [name] attended session today. They are both experiencing overwhelm following the diagnosis and really want to focus on learning and understanding how to best tackle this challenge together. The diagnosis is recent and it hit the couple "like a ton of bricks." Patient was fidgety and both patient and husband displayed signs of distress when talking about the uncertainty and fear around the situation. Both are individually anxious and struggling to adjust, and want to be strong for the other one. Different ways of processing emotions seems to be a barrier to support, which is a direction for future treatment. Treatment will focus on improving communications skills and accepting their differences in processing.      Gaylyn Rong Counseling Intern

## 2019-10-17 ENCOUNTER — Other Ambulatory Visit: Payer: Self-pay | Admitting: Hematology

## 2019-10-17 ENCOUNTER — Other Ambulatory Visit: Payer: Self-pay

## 2019-10-17 DIAGNOSIS — C250 Malignant neoplasm of head of pancreas: Secondary | ICD-10-CM

## 2019-10-18 ENCOUNTER — Other Ambulatory Visit: Payer: Self-pay

## 2019-10-18 ENCOUNTER — Inpatient Hospital Stay (HOSPITAL_BASED_OUTPATIENT_CLINIC_OR_DEPARTMENT_OTHER): Payer: BC Managed Care – PPO | Admitting: Hematology

## 2019-10-18 ENCOUNTER — Ambulatory Visit (HOSPITAL_COMMUNITY)
Admission: RE | Admit: 2019-10-18 | Discharge: 2019-10-18 | Disposition: A | Discharge status: Home / Self Care | Payer: BC Managed Care – PPO | Source: Ambulatory Visit | Attending: Hematology | Admitting: Hematology

## 2019-10-18 ENCOUNTER — Other Ambulatory Visit: Payer: Self-pay | Admitting: Hematology

## 2019-10-18 ENCOUNTER — Inpatient Hospital Stay: Payer: BC Managed Care – PPO | Admitting: Nutrition

## 2019-10-18 ENCOUNTER — Encounter: Payer: Self-pay | Admitting: Hematology

## 2019-10-18 ENCOUNTER — Telehealth: Payer: Self-pay | Admitting: Hematology

## 2019-10-18 VITALS — BP 128/84 | HR 128 | Temp 99.5°F | Resp 18 | Ht 67.0 in | Wt 187.8 lb

## 2019-10-18 DIAGNOSIS — R634 Abnormal weight loss: Secondary | ICD-10-CM | POA: Diagnosis not present

## 2019-10-18 DIAGNOSIS — C250 Malignant neoplasm of head of pancreas: Secondary | ICD-10-CM

## 2019-10-18 DIAGNOSIS — R1010 Upper abdominal pain, unspecified: Secondary | ICD-10-CM | POA: Diagnosis not present

## 2019-10-18 DIAGNOSIS — M329 Systemic lupus erythematosus, unspecified: Secondary | ICD-10-CM | POA: Diagnosis not present

## 2019-10-18 DIAGNOSIS — Z7189 Other specified counseling: Secondary | ICD-10-CM | POA: Diagnosis not present

## 2019-10-18 DIAGNOSIS — K769 Liver disease, unspecified: Secondary | ICD-10-CM | POA: Diagnosis not present

## 2019-10-18 DIAGNOSIS — J9811 Atelectasis: Secondary | ICD-10-CM | POA: Diagnosis not present

## 2019-10-18 DIAGNOSIS — F419 Anxiety disorder, unspecified: Secondary | ICD-10-CM | POA: Diagnosis not present

## 2019-10-18 DIAGNOSIS — I10 Essential (primary) hypertension: Secondary | ICD-10-CM | POA: Diagnosis not present

## 2019-10-18 DIAGNOSIS — G43909 Migraine, unspecified, not intractable, without status migrainosus: Secondary | ICD-10-CM | POA: Diagnosis not present

## 2019-10-18 MED ORDER — ALPRAZOLAM 0.25 MG PO TABS: 0.2500 mg | ORAL_TABLET | Freq: Every evening | ORAL | 0 refills | Status: DC | PRN

## 2019-10-18 MED ORDER — PROCHLORPERAZINE MALEATE 10 MG PO TABS: 10.0000 mg | ORAL_TABLET | Freq: Four times a day (QID) | ORAL | 1 refills | Status: DC | PRN

## 2019-10-18 MED ORDER — ONDANSETRON HCL 8 MG PO TABS: 8.0000 mg | ORAL_TABLET | Freq: Two times a day (BID) | ORAL | 1 refills | Status: DC | PRN

## 2019-10-18 MED ORDER — LIDOCAINE-PRILOCAINE 2.5-2.5 % EX CREA: TOPICAL_CREAM | CUTANEOUS | 3 refills | Status: DC

## 2019-10-18 NOTE — Telephone Encounter (Signed)
Scheduled per 9/23 los. Pt is aware of appt times and dates.

## 2019-10-18 NOTE — Progress Notes (Signed)
START ON PATHWAY REGIMEN - Pancreatic Adenocarcinoma     A cycle is every 14 days:     Oxaliplatin      Leucovorin      Irinotecan      Fluorouracil   **Always confirm dose/schedule in your pharmacy ordering system**  Patient Characteristics: Metastatic Disease, First Line, PS = 0,1, BRCA1/2 and PALB2  Mutation Absent/Unknown Therapeutic Status: Metastatic Disease Line of Therapy: First Line ECOG Performance Status: 1 BRCA1/2 Mutation Status: Awaiting Test Results PALB2 Mutation Status: Awaiting Test Results Intent of Therapy: Non-Curative / Palliative Intent, Discussed with Patient 

## 2019-10-19 ENCOUNTER — Encounter (HOSPITAL_COMMUNITY): Payer: Self-pay

## 2019-10-19 ENCOUNTER — Telehealth: Payer: Self-pay

## 2019-10-19 DIAGNOSIS — R000 Tachycardia, unspecified: Secondary | ICD-10-CM | POA: Diagnosis not present

## 2019-10-19 DIAGNOSIS — R509 Fever, unspecified: Secondary | ICD-10-CM | POA: Diagnosis not present

## 2019-10-19 NOTE — Telephone Encounter (Signed)
Left vm for Ms Cynthia Frye to return my call regarding he CXR yesterday.

## 2019-10-19 NOTE — Progress Notes (Unsigned)
Cynthia Frye Female, 59 y.o., 08-Nov-1960 MRN:  [id number] Phone:  [phone] (H) PCP:  Josetta Huddle, MD Coverage:  Sherre Poot [employer] Ppo Next Appt With Radiology (GI-BCG DIAG TOMO 1) 10/23/2019 at 9:40 AM  RE: Biopsy Received: 2 days ago Message Details  [name], DO  [name] for attempt US guided liver mass biopsy.    Discussed this am at GI oncology conference.   Consensus was to send to [facility] pathway for the axillary lymph nodes.   Thank you  [name]   Previous Messages  ----- Message -----  From: [name]  Sent: 10/17/2019  9:33 AM EDT  To: [name]  Subject: Biopsy                      Procedure Requested: US Biopsy (Liver)    Reason for Procedure: confirm liver metastasis from pancreatic cancer    Provider Requesting: Truitt Merle  Provider Telephone: [phone]    Other Info: Pet was denied. MRI completed

## 2019-10-19 NOTE — Progress Notes (Signed)
Patient called requesting return call.  Called patient back, she let me know her initial Covid test is negative but they sent another test that will be back on Sunday.  Still running low grade temperature 99.5.  Patient tearful on the phone, feels overwhelmed, she is now home not working, has no hobbies, suggested her finding distractions reading books, playing video games.  I feels her heart is racing.  Inquired about her fluid intake, she openly admits to not drinking enough fluids, I instructed her to drink at least 8 glasses of fluid daily.  Dr. Burr Medico prescribed Alprazolam has not been picked up yet.  I explained this will help her anxiety and sleep. She verbalized an understanding.

## 2019-10-22 ENCOUNTER — Other Ambulatory Visit: Payer: Self-pay | Admitting: Hematology

## 2019-10-22 ENCOUNTER — Other Ambulatory Visit: Payer: Self-pay

## 2019-10-22 ENCOUNTER — Inpatient Hospital Stay: Payer: BC Managed Care – PPO

## 2019-10-22 MED ORDER — SERTRALINE HCL 50 MG PO TABS: 50.0000 mg | ORAL_TABLET | Freq: Every day | ORAL | 0 refills | Status: DC

## 2019-10-23 ENCOUNTER — Other Ambulatory Visit: Payer: Self-pay | Admitting: Hematology

## 2019-10-23 ENCOUNTER — Ambulatory Visit
Admission: RE | Admit: 2019-10-23 | Discharge: 2019-10-23 | Disposition: A | Discharge status: Home / Self Care | Payer: BC Managed Care – PPO | Source: Ambulatory Visit | Attending: Hematology | Admitting: Hematology

## 2019-10-23 DIAGNOSIS — R922 Inconclusive mammogram: Secondary | ICD-10-CM | POA: Diagnosis not present

## 2019-10-23 DIAGNOSIS — C250 Malignant neoplasm of head of pancreas: Secondary | ICD-10-CM

## 2019-10-23 DIAGNOSIS — N6489 Other specified disorders of breast: Secondary | ICD-10-CM | POA: Diagnosis not present

## 2019-10-23 NOTE — Progress Notes (Signed)
[place]   [phone] [phone]   Clinic Follow up Note   Patient Care Team: Josetta Huddle, MD as PCP - General (Internal Medicine) Jonnie Finner, RN as Oncology Nurse Navigator Truitt Merle, MD as Consulting Physician (Oncology) Arta Silence, MD as Consulting Physician (Gastroenterology) [name], MD as Consulting Physician (General Surgery) 10/24/2019  CHIEF COMPLAINT: F/u pancreatic cancer   SUMMARY OF ONCOLOGIC HISTORY: Oncology History Overview Note  Cancer Staging Pancreatic cancer [facility]) Staging form: Exocrine Pancreas, AJCC 8th Edition - Clinical stage from 10/18/2019: Stage IV (cT3, cN1, cM1) - Signed by Truitt Merle, MD on 10/18/2019    Pancreatic cancer ([place])  09/19/2019 Imaging   CT AP w contrast 09/19/19  IMPRESSION: 1. Findings are highly concerning for probable primary pancreatic adenocarcinoma in the anterior aspect of the pancreatic head. Several prominent borderline enlarged lymph nodes are noted in the hepatoduodenal nodal station, and there are multiple indeterminate liver lesions which are highly concerning for probable hepatic metastases. Further evaluation with nonemergent abdominal MRI with and without IV gadolinium with MRCP is recommended in the near future to better evaluate these findings.   09/25/2019 Procedure   Upper EUS by Dr Paulita Fujita  IMPRESSION -There was no evidence of significant pathology in the left lobe of the liver.  -A few lymph nodes were visualized and measures in the peripancreatic region and porta hepata region.  -Hyperchoic material consistent with sludge was visualized endosonographically in the gallbladder.  -There was no sign of significant pathology in the common bile duct.  -A mass was identified in the pancreatic head. If biopsy results show adenocarcinoma, it would be staged T3N1Mx by endosonographic criteria. Fine needle aspiration performed.    09/25/2019 Initial Biopsy   FINAL  MICROSCOPIC DIAGNOSIS: Fine needle aspirate, Pancreas;  MALIGNANT CELLS PRESENT CONSISTENT WITH ADENOCARCINOMA.    10/02/2019 Initial Diagnosis   Pancreatic cancer ([place])   10/11/2019 Procedure   PAC placement y Dr Barry Dienes    10/15/2019 Imaging   CT Chest  IMPRESSION: 1. Small anterior left pneumothorax with dependent atelectasis in the left lower lobe. 2. Increased number of bilateral axillary and subpectoral lymph nodes with mild lymphadenopathy in the left axilla. While this would be an atypical presentation for metastatic pancreatic cancer, this possibility is not excluded 3. Main duct dilatation in the pancreas, better assessed on abdomen CT 09/19/2019.   10/18/2019 Cancer Staging   Staging form: Exocrine Pancreas, AJCC 8th Edition - Clinical stage from 10/18/2019: Stage IV (cT3, cN1, cM1) - Signed by Truitt Merle, MD on 10/18/2019   10/24/2019 -  Chemotherapy   The patient had dexamethasone (DECADRON) 4 MG tablet, 8 mg, Oral, Daily, 1 of 1 cycle, Start date: --, End date: -- palonosetron (ALOXI) injection 0.25 mg, 0.25 mg, Intravenous,  Once, 1 of 4 cycles Administration: 0.25 mg (10/24/2019) pegfilgrastim-cbqv (UDENYCA) injection 6 mg, 6 mg, Subcutaneous, Once, 1 of 4 cycles irinotecan (CAMPTOSAR) 300 mg in sodium chloride 0.9 % 500 mL chemo infusion, 150 mg/m2 = 300 mg, Intravenous,  Once, 1 of 4 cycles Administration: 300 mg (10/24/2019) oxaliplatin (ELOXATIN) 170 mg in dextrose 5 % 500 mL chemo infusion, 85 mg/m2 = 170 mg, Intravenous,  Once, 1 of 4 cycles Administration: 170 mg (10/24/2019) fosaprepitant (EMEND) 150 mg in sodium chloride 0.9 % 145 mL IVPB, 150 mg, Intravenous,  Once, 1 of 4 cycles Administration: 150 mg (10/24/2019) fluorouracil (ADRUCIL) 4,800 mg in sodium chloride 0.9 % 54 mL chemo infusion, 2,400 mg/m2 = 4,800 mg, Intravenous,  1 Day/Dose, 1 of 4 cycles Administration: 4,800 mg (10/24/2019) leucovorin 804 mg in sodium chloride 0.9 % 250 mL infusion, 400 mg/m2 = 804  mg, Intravenous,  Once, 1 of 4 cycles Administration: 804 mg (10/24/2019)  for chemotherapy treatment.      CURRENT THERAPY: First line FOLFIRINOX starting 10/24/2019  INTERVAL HISTORY: Ms. Smitherman returns for f/u and treatment as scheduled. She underwent left diagnostic mammo and [place] on 10/23/19 that showed no left breast mass but found multiple prominent left axillary LNs with cortical thickening up to 5-6 mm, a biopsy has been scheduled as well as a liver biopsy next week.  Today she presents by herself.  She felt well the past couple days, no abdominal pain and better appetite.  She has food allergies and feels like she does not have a lot of variety in her diet.  When she has pain it usually in the epigastric area and occasionally in mid back, usually relieved with Nexium.  She has oxycodone but does not take it.  No BM in 2 days.  She has some tingling in her fingertips from carpal tunnel.  No functional difficulties. She is not sleeping well. She was recently prescribed xanax and sertraline but has not started anti-depressant.  No recent fever, chills, cough, chest pain, dyspnea, nausea or vomiting.     MEDICAL HISTORY:  Past Medical History:  Diagnosis Date  . Allergies    peanuts, corn, beans, red grapefruit, naval oranges  . Asthma    allergy shots and medication  . DDD (degenerative disc disease), lumbar   . Eustachian tube dysfunction 12/2012   rhinitis, vertigo- Dr. Wilburn Cornelia, ENT   . Fibroids   . Heart murmur    Echo 1/18: EF 55-60, no RWMA, normal diastolic function, trivial AI, PASP 32  . History of cardiac catheterization    LHC 3/18: normal coronary arteries.   . History of nuclear stress test    ETT-Myoview 2/18: EF 62, + ECG response; apical and distal septal ischemia; intermediate risk.  Marland Kitchen Hypertension   . Iron deficiency anemia   . Lupus [facility]) 2011   Dr. Trudie Reed  . Migraine headache    trial of generic maxalt 10 mg, January 2021  . Obesity   . Seasonal allergies    . Seizure in childhood [facility])    as a child no treatment none x 30 years    SURGICAL HISTORY: Past Surgical History:  Procedure Laterality Date  . ABDOMINAL HYSTERECTOMY    . BACK SURGERY  2009  . COLONOSCOPY WITH PROPOFOL N/A 04/11/2012   Procedure: COLONOSCOPY WITH PROPOFOL;  Surgeon: Garlan Fair, MD;  Location: [facility];  Service: Endoscopy;  Laterality: N/A;  . HERNIA REPAIR     as child unbilical  . G8-J partial discectomy and laminectomy     Dr. Christella Noa  . LEFT HEART CATH AND CORONARY ANGIOGRAPHY N/A 04/09/2016   Procedure: Left Heart Cath and Coronary Angiography;  Surgeon: Nelva Bush, MD;  Location: [place] [facility];  Service: Cardiovascular;  Laterality: N/A;  . MOUTH SURGERY    . PORTACATH PLACEMENT N/A 10/11/2019   Procedure: INSERTION PORT-A-CATH LEFT SUBCLAVIAN;  Surgeon: [name], MD;  Location: [place];  Service: General;  Laterality: N/A;    I have reviewed the social history and family history with the patient and they are unchanged from previous note.  ALLERGIES:  is allergic to cinnamon, peanut-containing drug products, corn oil, corn-containing products, and other.  MEDICATIONS:  Current  Outpatient Medications  Medication Sig Dispense Refill  . albuterol (PROVENTIL HFA;VENTOLIN HFA) 108 (90 Base) MCG/ACT inhaler Inhale 2 puffs into the lungs every 6 (six) hours as needed for wheezing or shortness of breath. 18 g 0  . ALPRAZolam (XANAX) 0.25 MG tablet Take 1 tablet (0.25 mg total) by mouth at bedtime as needed for anxiety. 20 tablet 0  . aspirin 81 MG tablet Take 81 mg by mouth daily.    . Bepotastine Besilate (BEPREVE) 1.5 % SOLN Place 1 drop into both eyes 2 (two) times daily as needed (DRY EYES).    . cetirizine (ZYRTEC) 10 MG chewable tablet Chew 10 mg by mouth daily.    . Cholecalciferol (VITAMIN D) 2000 units CAPS Take 2,000 Units by mouth daily.     . diclofenac (VOLTAREN) 75 MG EC tablet Take 75 mg by mouth 2 (two)  times daily.    . Dietary Management Product (RHEUMATE) CAPS Take 1 capsule by mouth daily. [place]    . DIPROLENE 0.05 % ointment Apply 1 application topically as needed (SKIN IRRITATION).     Marland Kitchen esomeprazole (NEXIUM) 10 MG packet Take 10 mg by mouth daily before breakfast.    . ferrous sulfate 325 (65 FE) MG tablet Take 325 mg by mouth daily with breakfast.    . Flaxseed, Linseed, (FLAXSEED OIL) 1000 MG CAPS Take 1,000 mg by mouth daily.    . Fluocinolone Acetonide Scalp 0.01 % OIL Apply 1 application topically daily as needed for itching.    . fluticasone (FLONASE) 50 MCG/ACT nasal spray Place 2 sprays into both nostrils daily. 16 g 12  . hydroxychloroquine (PLAQUENIL) 200 MG tablet Take 400 mg by mouth daily.   5  . lidocaine-prilocaine (EMLA) cream Apply to affected area once 30 g 3  . losartan-hydrochlorothiazide (HYZAAR) 50-12.5 MG tablet Take 1 tablet by mouth daily.     . methotrexate 50 MG/2ML injection Inject 25 mg into the skin once a week. 65mL = 25mg   3  . metoprolol succinate (TOPROL-XL) 50 MG 24 hr tablet Take 50 mg by mouth daily. Take with or immediately following a meal.     . ondansetron (ZOFRAN) 8 MG tablet Take 1 tablet (8 mg total) by mouth 2 (two) times daily as needed. Start on day 3 after chemotherapy. 30 tablet 1  . oxyCODONE (OXY IR/ROXICODONE) 5 MG immediate release tablet Take 1 tablet (5 mg total) by mouth every 6 (six) hours as needed for severe pain. 8 tablet 0  . prochlorperazine (COMPAZINE) 10 MG tablet Take 1 tablet (10 mg total) by mouth every 6 (six) hours as needed (Nausea or vomiting). 30 tablet 1  . rizatriptan (MAXALT-MLT) 10 MG disintegrating tablet Take 1 tablet (10 mg total) by mouth as needed for migraine. May repeat in 2 hours if needed 9 tablet 11  . sertraline (ZOLOFT) 50 MG tablet Take 1 tablet (50 mg total) by mouth daily. 30 tablet 0  . topiramate (TOPAMAX) 50 MG tablet Take 75mg  to 100mg  (1.5 - 2 tablets) daily at bedtime for  headaches 180 tablet 3  . potassium chloride SA (KLOR-CON) 20 MEQ tablet Take 1 tablet (20 mEq total) by mouth 2 (two) times daily. 60 tablet 1   No current facility-administered medications for this visit.   Facility-Administered Medications Ordered in Other Visits  Medication Dose Route Frequency Provider Last Rate Last Admin  . fluorouracil (ADRUCIL) 4,800 mg in sodium chloride 0.9 % 54 mL chemo infusion  2,400 mg/m2 (Treatment Plan Recorded) Intravenous 1 day or 1 dose Truitt Merle, MD   4,800 mg at 10/24/19 1651    PHYSICAL EXAMINATION: ECOG PERFORMANCE STATUS: 1 - Symptomatic but completely ambulatory  Vitals:   10/24/19 1015  BP: 129/75  Pulse: 99  Resp: 20  Temp: 98.1 F (36.7 C)  SpO2: 100%   Filed Weights   10/24/19 1015  Weight: 187 lb 11.2 oz (85.1 kg)    GENERAL:alert, no distress and comfortable SKIN: No rash to exposed skin EYES:  sclera clear NECK: Without mass LUNGS: clear with normal breathing effort HEART: regular rate & rhythm, no lower extremity edema ABDOMEN:abdomen soft, non-tender and normal bowel sounds NEURO: alert & oriented x 3 with fluent speech, no focal motor/sensory deficits PAC covered with gauze  LABORATORY DATA:  I have reviewed the data as listed CBC Latest Ref Rng & Units 10/24/2019 04/22/2019 02/22/2018  WBC 4.0 - 10.5 K/uL 4.1 3.3(L) 2.8(L)  Hemoglobin 12.0 - 15.0 g/dL 9.9(L) 13.2 11.7(L)  Hematocrit 36 - 46 % 28.1(L) 39.4 35.2(L)  Platelets 150 - 400 K/uL 267 197 195     CMP Latest Ref Rng & Units 10/24/2019 04/22/2019 02/22/2018  Glucose 70 - 99 mg/dL 101(H) 78 94  BUN 6 - 20 mg/dL 15 17 18   Creatinine 0.44 - 1.00 mg/dL 0.94 0.89 0.88  Sodium 135 - 145 mmol/L 134(L) 142 139  Potassium 3.5 - 5.1 mmol/L 3.1(L) 3.3(L) 4.2  Chloride 98 - 111 mmol/L 98 106 106  CO2 22 - 32 mmol/L 31 26 26   Calcium 8.9 - 10.3 mg/dL 9.9 10.4(H) 9.5  Total Protein 6.5 - 8.1 g/dL 7.9 8.6(H) -  Total Bilirubin 0.3 - 1.2 mg/dL 0.5 0.7 -  Alkaline Phos 38  - 126 U/L 102 71 -  AST 15 - 41 U/L 28 26 -  ALT 0 - 44 U/L 27 25 -      RADIOGRAPHIC STUDIES: I have personally reviewed the radiological images as listed and agreed with the findings in the report. MM DIAG BREAST TOMO UNI LEFT  Addendum Date: 10/24/2019   ADDENDUM REPORT: 10/24/2019 15:27 ADDENDUM: This is an addendum to the report originally dictated on 10/15/2019. I spoke with the patient's referring provider Dr. Burr Medico today, who provided additional history that the patient had the second COVID vaccine recently within her left arm. This may account for her current imaging findings. Therefore, the IMPRESSION, RECOMMENDATION BI-RADS CATEGORY should read as follows. IMPRESSION: 1. Left axillary lymphadenopathy in keeping with recent CT findings may be related to recent second COVID vaccine in the ipsilateral arm. 2. No mammographic evidence of malignancy within the left breast. RECOMMENDATION: Recommendation is for the patient to return in 8 weeks for follow-up ultrasound evaluation of the left axilla to ensure improvement/resolution of her current lymphadenopathy. BI-RADS CATEGORY: 3: Probably benign. Electronically Signed   By: Kristopher Oppenheim M.D.   On: 10/24/2019 15:27   Result Date: 10/24/2019 CLINICAL DATA:  60 year old female, recently diagnosed with pancreatic cancer and prominent left axillary lymphadenopathy on recent CT evaluation. The patient had a port placed on the left side approximately 2 weeks ago. EXAM: DIGITAL DIAGNOSTIC LEFT MAMMOGRAM WITH CAD AND TOMO ULTRASOUND LEFT BREAST COMPARISON:  Previous exam(s). ACR Breast Density Category c: The breast tissue is heterogeneously dense, which may obscure small masses. FINDINGS: No suspicious mammographic findings are identified in the left breast. The parenchymal pattern is stable. Mammographic images were processed with CAD. Targeted ultrasound is performed, showing multiple prominent left  axillary lymph nodes with diffuse cortical  thickening up to 5-6 mm. IMPRESSION: 1. Left axillary lymphadenopathy in keeping with recent CT findings. Recommendation is for ultrasound-guided biopsy. The differential includes metastatic disease, lymphoproliferative disorder and benign reactive changes. 2. No mammographic evidence of malignancy within the left breast. RECOMMENDATION: Ultrasound-guided biopsy of a left axillary lymph node is recommended. Samples should be sent in both formalin and saline. I have discussed the findings and recommendations with the patient. If applicable, a reminder letter will be sent to the patient regarding the next appointment. BI-RADS CATEGORY  4: Suspicious. Electronically Signed: By: Kristopher Oppenheim M.D. On: 10/23/2019 10:55   [place] AXILLA LEFT  Addendum Date: 10/24/2019   ADDENDUM REPORT: 10/24/2019 15:27 ADDENDUM: This is an addendum to the report originally dictated on 10/15/2019. I spoke with the patient's referring provider Dr. Burr Medico today, who provided additional history that the patient had the second COVID vaccine recently within her left arm. This may account for her current imaging findings. Therefore, the IMPRESSION, RECOMMENDATION BI-RADS CATEGORY should read as follows. IMPRESSION: 1. Left axillary lymphadenopathy in keeping with recent CT findings may be related to recent second COVID vaccine in the ipsilateral arm. 2. No mammographic evidence of malignancy within the left breast. RECOMMENDATION: Recommendation is for the patient to return in 8 weeks for follow-up ultrasound evaluation of the left axilla to ensure improvement/resolution of her current lymphadenopathy. BI-RADS CATEGORY: 3: Probably benign. Electronically Signed   By: Kristopher Oppenheim M.D.   On: 10/24/2019 15:27   Result Date: 10/24/2019 CLINICAL DATA:  59 year old female, recently diagnosed with pancreatic cancer and prominent left axillary lymphadenopathy on recent CT evaluation. The patient had a port placed on the left side approximately 2  weeks ago. EXAM: DIGITAL DIAGNOSTIC LEFT MAMMOGRAM WITH CAD AND TOMO ULTRASOUND LEFT BREAST COMPARISON:  Previous exam(s). ACR Breast Density Category c: The breast tissue is heterogeneously dense, which may obscure small masses. FINDINGS: No suspicious mammographic findings are identified in the left breast. The parenchymal pattern is stable. Mammographic images were processed with CAD. Targeted ultrasound is performed, showing multiple prominent left axillary lymph nodes with diffuse cortical thickening up to 5-6 mm. IMPRESSION: 1. Left axillary lymphadenopathy in keeping with recent CT findings. Recommendation is for ultrasound-guided biopsy. The differential includes metastatic disease, lymphoproliferative disorder and benign reactive changes. 2. No mammographic evidence of malignancy within the left breast. RECOMMENDATION: Ultrasound-guided biopsy of a left axillary lymph node is recommended. Samples should be sent in both formalin and saline. I have discussed the findings and recommendations with the patient. If applicable, a reminder letter will be sent to the patient regarding the next appointment. BI-RADS CATEGORY  4: Suspicious. Electronically Signed: By: Kristopher Oppenheim M.D. On: 10/23/2019 10:55     ASSESSMENT & PLAN: Cynthia Frye is a 59 y.o. female with    1. 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 Her EUS biopsy with Dr Paulita Fujita on 09/25/19 shows pancreatic adenocarcinoma.  -Her CT Scan also showsseveral subcentimeterliver lesions suspicious for metastasis,also technically indeterminate. -CT chest from 10/15/19 and MRI abdomen from 10/16/19 showed multiple left axillary and subpectoral adenopathy, likely related to her recent Covid injection in left upper arm.   -Her abdominal MRI showed multiple (>10) small liver lesions, most are consistent with  metastatic lesions. Consensus in GI tumor conference is to attempt IR liver biopsy which is scheduled on 10/5,  will request [facility].  -we reviewed her left mammogram/US from 9/28 which shows multiple left axillary LNs with cortical thickening up to 5-6 mm. Not palpable on my exam. Per review with Dr. Burr Medico and radiology, this is likely reactive to covid19 vaccine and the recommendation is short interval follow up.  -She underwent port placement, chemo class, and previously consented to FOLFIRINOX chemo with palliative intent.  -Ms. Weimar appears stable. We reviewed potential chemo toxicities and symptom management. She understands to call with fever, chills, change in respiratory status, rash, or uncontrolled GI ide effects.  -We reviewed the CBC and CMP from today. She will begin oral K BID. Proceed with C1 FOLFIRINOX 10/24/19 today   2. Upper abdominal cramps, Weight loss -Secondary to #1  -She has had intermittent upper abdominal cramping after certain meals. She also has had a gradual 40 pounds weight loss over 7 months (since 03/2019) -Her appetite is otherwise stable. Continue to f/u with dietician.  -She has oxycodone for her Lupus but doesn't take it. Epigastric pain mostly relieved with Nexium -we discussed appetite stimulants and nutrition supplements. She has food allergies and little variety in her diet.  -I recommend mirtazapine, also to help her sleep. She was prescribed sertraline for anxiety but has not started yet. She agrees to hold sertraline for now and try mirtazapine. There may be CI with mirtazapine and her food allergies, I am following up with pharmacy.   3. Comorbidities: Lupus, HTN, Migraines  -Symptomatic in her joints, Dx many years ago  -She is being followed by Rheumatologist Dr Wyline Copas and being treated with weekly Methotrexate injections, Plaquenil andDiclofenac. -For Migraines is well controlled on Topomax. She has been seen by Dr Jaynee Eagles.   4. Left small  pneumothorax -Secondary to port placement -repeat CXR9/23/21 shows enlarging but still small persistent pneumo, she is asymptomatic today  -monitoring   5. Anxiety, Social Support  -She has been very anxious with the fast pace workup and unexpected diagnosis. -She lives with her husband in [place]. She does not have children. She does have a brother who lives close by but no other near relatives. -She plans to apply for disability -We will refer her to Education officer, museum for support    PLAN: -Labs, mammo/US reviewed -Proceed with cycle 1 FOLFIRINOX today -Genetics, dietician this week -Phone f/u toxicity check next week -Rx: K 20 meq BID, mirtazapine 15 mg qHS. Do not take sertraline  -Liver Bx 10/5 -Short interval f/u left axillary [place], adenopathy likely reactive to recent covid19 vaccine  -F/u in 2 weeks with cycle 2   All questions were answered. The patient knows to call the clinic with any problems, questions or concerns. No barriers to learning were detected. Total encounter time was 35 minutes.      Alla Feeling, NP 10/24/19

## 2019-10-24 ENCOUNTER — Encounter: Payer: Self-pay | Admitting: Nurse Practitioner

## 2019-10-24 ENCOUNTER — Inpatient Hospital Stay: Payer: BC Managed Care – PPO

## 2019-10-24 ENCOUNTER — Other Ambulatory Visit: Payer: Self-pay | Admitting: Hematology

## 2019-10-24 ENCOUNTER — Other Ambulatory Visit: Payer: Self-pay

## 2019-10-24 ENCOUNTER — Inpatient Hospital Stay (HOSPITAL_BASED_OUTPATIENT_CLINIC_OR_DEPARTMENT_OTHER): Payer: BC Managed Care – PPO | Admitting: Nurse Practitioner

## 2019-10-24 ENCOUNTER — Telehealth: Payer: Self-pay | Admitting: Nutrition

## 2019-10-24 VITALS — BP 129/75 | HR 99 | Temp 98.1°F | Resp 20 | Ht 67.0 in | Wt 187.7 lb

## 2019-10-24 DIAGNOSIS — G43909 Migraine, unspecified, not intractable, without status migrainosus: Secondary | ICD-10-CM | POA: Diagnosis not present

## 2019-10-24 DIAGNOSIS — C189 Malignant neoplasm of colon, unspecified: Secondary | ICD-10-CM | POA: Diagnosis not present

## 2019-10-24 DIAGNOSIS — C250 Malignant neoplasm of head of pancreas: Secondary | ICD-10-CM

## 2019-10-24 DIAGNOSIS — I10 Essential (primary) hypertension: Secondary | ICD-10-CM | POA: Diagnosis not present

## 2019-10-24 DIAGNOSIS — R599 Enlarged lymph nodes, unspecified: Secondary | ICD-10-CM

## 2019-10-24 DIAGNOSIS — R1010 Upper abdominal pain, unspecified: Secondary | ICD-10-CM | POA: Diagnosis not present

## 2019-10-24 DIAGNOSIS — K769 Liver disease, unspecified: Secondary | ICD-10-CM | POA: Diagnosis not present

## 2019-10-24 DIAGNOSIS — Z7189 Other specified counseling: Secondary | ICD-10-CM

## 2019-10-24 DIAGNOSIS — F419 Anxiety disorder, unspecified: Secondary | ICD-10-CM | POA: Diagnosis not present

## 2019-10-24 DIAGNOSIS — R634 Abnormal weight loss: Secondary | ICD-10-CM | POA: Diagnosis not present

## 2019-10-24 DIAGNOSIS — M329 Systemic lupus erythematosus, unspecified: Secondary | ICD-10-CM | POA: Diagnosis not present

## 2019-10-24 DIAGNOSIS — Z95828 Presence of other vascular implants and grafts: Secondary | ICD-10-CM

## 2019-10-24 LAB — CBC WITH DIFFERENTIAL (CANCER CENTER ONLY)
Abs Immature Granulocytes: 0.02 10*3/uL (ref 0.00–0.07)
Basophils Absolute: 0 10*3/uL (ref 0.0–0.1)
Basophils Relative: 1 %
Eosinophils Absolute: 0.1 10*3/uL (ref 0.0–0.5)
Eosinophils Relative: 3 %
HCT: 28.1 % — ABNORMAL LOW (ref 36.0–46.0)
Hemoglobin: 9.9 g/dL — ABNORMAL LOW (ref 12.0–15.0)
Immature Granulocytes: 1 %
Lymphocytes Relative: 20 %
Lymphs Abs: 0.8 10*3/uL (ref 0.7–4.0)
MCH: 30.2 pg (ref 26.0–34.0)
MCHC: 35.2 g/dL (ref 30.0–36.0)
MCV: 85.7 fL (ref 80.0–100.0)
Monocytes Absolute: 0.5 10*3/uL (ref 0.1–1.0)
Monocytes Relative: 12 %
Neutro Abs: 2.6 10*3/uL (ref 1.7–7.7)
Neutrophils Relative %: 63 %
Platelet Count: 267 10*3/uL (ref 150–400)
RBC: 3.28 MIL/uL — ABNORMAL LOW (ref 3.87–5.11)
RDW: 12.1 % (ref 11.5–15.5)
WBC Count: 4.1 10*3/uL (ref 4.0–10.5)
nRBC: 0 % (ref 0.0–0.2)

## 2019-10-24 LAB — CMP (CANCER CENTER ONLY)
ALT: 27 U/L (ref 0–44)
AST: 28 U/L (ref 15–41)
Albumin: 3.6 g/dL (ref 3.5–5.0)
Alkaline Phosphatase: 102 U/L (ref 38–126)
Anion gap: 5 (ref 5–15)
BUN: 15 mg/dL (ref 6–20)
CO2: 31 mmol/L (ref 22–32)
Calcium: 9.9 mg/dL (ref 8.9–10.3)
Chloride: 98 mmol/L (ref 98–111)
Creatinine: 0.94 mg/dL (ref 0.44–1.00)
GFR, Est AFR Am: >60 mL/min (ref >60)
GFR, Estimated: >60 mL/min (ref >60)
Glucose, Bld: 101 mg/dL — ABNORMAL HIGH (ref 70–99)
Potassium: 3.1 mmol/L — ABNORMAL LOW (ref 3.5–5.1)
Sodium: 134 mmol/L — ABNORMAL LOW (ref 135–145)
Total Bilirubin: 0.5 mg/dL (ref 0.3–1.2)
Total Protein: 7.9 g/dL (ref 6.5–8.1)

## 2019-10-24 MED ORDER — SODIUM CHLORIDE 0.9% FLUSH
10.0000 mL | Freq: Once | INTRAVENOUS | Status: AC
  Administered 2019-10-24: 10 mL
  Filled 2019-10-24: qty 10

## 2019-10-24 MED ORDER — ATROPINE SULFATE 1 MG/ML IJ SOLN
INTRAMUSCULAR | Status: AC
  Filled 2019-10-24: qty 1

## 2019-10-24 MED ORDER — PALONOSETRON HCL INJECTION 0.25 MG/5ML
0.2500 mg | Freq: Once | INTRAVENOUS | Status: AC
  Administered 2019-10-24: 0.25 mg via INTRAVENOUS

## 2019-10-24 MED ORDER — SODIUM CHLORIDE 0.9 % IV SOLN
150.0000 mg/m2 | Freq: Once | INTRAVENOUS | Status: AC
  Administered 2019-10-24: 300 mg via INTRAVENOUS
  Filled 2019-10-24: qty 15

## 2019-10-24 MED ORDER — DEXTROSE 5 % IV SOLN
Freq: Once | INTRAVENOUS | Status: AC
  Filled 2019-10-24: qty 250

## 2019-10-24 MED ORDER — OXALIPLATIN CHEMO INJECTION 100 MG/20ML
85.0000 mg/m2 | Freq: Once | INTRAVENOUS | Status: AC
  Administered 2019-10-24: 170 mg via INTRAVENOUS
  Filled 2019-10-24: qty 34

## 2019-10-24 MED ORDER — ATROPINE SULFATE 0.4 MG/ML IJ SOLN
INTRAMUSCULAR | Status: AC
  Filled 2019-10-24: qty 1

## 2019-10-24 MED ORDER — SODIUM CHLORIDE 0.9 % IV SOLN
150.0000 mg | Freq: Once | INTRAVENOUS | Status: AC
  Administered 2019-10-24: 150 mg via INTRAVENOUS
  Filled 2019-10-24: qty 150

## 2019-10-24 MED ORDER — POTASSIUM CHLORIDE CRYS ER 20 MEQ PO TBCR: 20.0000 meq | EXTENDED_RELEASE_TABLET | Freq: Two times a day (BID) | ORAL | 1 refills | Status: DC

## 2019-10-24 MED ORDER — SODIUM CHLORIDE 0.9 % IV SOLN
10.0000 mg | Freq: Once | INTRAVENOUS | Status: AC
  Administered 2019-10-24: 10 mg via INTRAVENOUS
  Filled 2019-10-24: qty 10

## 2019-10-24 MED ORDER — ATROPINE SULFATE 1 MG/ML IJ SOLN
0.5000 mg | Freq: Once | INTRAMUSCULAR | Status: AC | PRN
  Administered 2019-10-24: 0.5 mg via INTRAVENOUS

## 2019-10-24 MED ORDER — SODIUM CHLORIDE 0.9 % IV SOLN
400.0000 mg/m2 | Freq: Once | INTRAVENOUS | Status: AC
  Administered 2019-10-24: 804 mg via INTRAVENOUS
  Filled 2019-10-24: qty 40.2

## 2019-10-24 MED ORDER — PALONOSETRON HCL INJECTION 0.25 MG/5ML
INTRAVENOUS | Status: AC
  Filled 2019-10-24: qty 5

## 2019-10-24 MED ORDER — SODIUM CHLORIDE 0.9 % IV SOLN
2400.0000 mg/m2 | INTRAVENOUS | Status: DC
  Administered 2019-10-24: 4800 mg via INTRAVENOUS
  Filled 2019-10-24: qty 96

## 2019-10-24 NOTE — Patient Instructions (Signed)
[facility] Discharge Instructions for Patients Receiving Chemotherapy  Today you received the following chemotherapy agents: Oxaliplatin, Leucovorin, Irinotecan, and Fluorouracil  To help prevent nausea and vomiting after your treatment, we encourage you to take your nausea medication as directed.    If you develop nausea and vomiting that is not controlled by your nausea medication, call the clinic.   BELOW ARE SYMPTOMS THAT SHOULD BE REPORTED IMMEDIATELY:  *FEVER GREATER THAN 100.5 F  *CHILLS WITH OR WITHOUT FEVER  NAUSEA AND VOMITING THAT IS NOT CONTROLLED WITH YOUR NAUSEA MEDICATION  *UNUSUAL SHORTNESS OF BREATH  *UNUSUAL BRUISING OR BLEEDING  TENDERNESS IN MOUTH AND THROAT WITH OR WITHOUT PRESENCE OF ULCERS  *URINARY PROBLEMS  *BOWEL PROBLEMS  UNUSUAL RASH Items with * indicate a potential emergency and should be followed up as soon as possible.  Feel free to call the clinic should you have any questions or concerns. The clinic phone number is [phone].  Please show the [facility] at check-in to the Emergency Department and triage nurse.  Oxaliplatin Injection What is this medicine? OXALIPLATIN (ox AL i PLA tin) is a chemotherapy drug. It targets fast dividing cells, like cancer cells, and causes these cells to die. This medicine is used to treat cancers of the colon and rectum, and many other cancers. This medicine may be used for other purposes; ask your health care provider or pharmacist if you have questions. COMMON BRAND NAME(S): Eloxatin What should I tell my health care provider before I take this medicine? They need to know if you have any of these conditions:  heart disease  history of irregular heartbeat  liver disease  low blood counts, like white cells, platelets, or red blood cells  lung or breathing disease, like asthma  take medicines that treat or prevent blood clots  tingling of the fingers or toes, or other nerve  disorder  an unusual or allergic reaction to oxaliplatin, other chemotherapy, other medicines, foods, dyes, or preservatives  pregnant or trying to get pregnant  breast-feeding How should I use this medicine? This drug is given as an infusion into a vein. It is administered in a hospital or clinic by a specially trained health care professional. Talk to your pediatrician regarding the use of this medicine in children. Special care may be needed. Overdosage: If you think you have taken too much of this medicine contact a poison control center or emergency room at once. NOTE: This medicine is only for you. Do not share this medicine with others. What if I miss a dose? It is important not to miss a dose. Call your doctor or health care professional if you are unable to keep an appointment. What may interact with this medicine? Do not take this medicine with any of the following medications:  cisapride  dronedarone  pimozide  thioridazine This medicine may also interact with the following medications:  aspirin and aspirin-like medicines  certain medicines that treat or prevent blood clots like warfarin, apixaban, dabigatran, and rivaroxaban  cisplatin  cyclosporine  diuretics  medicines for infection like acyclovir, adefovir, amphotericin B, bacitracin, cidofovir, foscarnet, ganciclovir, gentamicin, pentamidine, vancomycin  NSAIDs, medicines for pain and inflammation, like ibuprofen or naproxen  other medicines that prolong the QT interval (an abnormal heart rhythm)  pamidronate  zoledronic acid This list may not describe all possible interactions. Give your health care provider a list of all the medicines, herbs, non-prescription drugs, or dietary supplements you use. Also tell them if you  smoke, drink alcohol, or use illegal drugs. Some items may interact with your medicine. What should I watch for while using this medicine? Your condition will be monitored carefully  while you are receiving this medicine. You may need blood work done while you are taking this medicine. This medicine may make you feel generally unwell. This is not uncommon as chemotherapy can affect healthy cells as well as cancer cells. Report any side effects. Continue your course of treatment even though you feel ill unless your healthcare professional tells you to stop. This medicine can make you more sensitive to cold. Do not drink cold drinks or use ice. Cover exposed skin before coming in contact with cold temperatures or cold objects. When out in cold weather wear warm clothing and cover your mouth and nose to warm the air that goes into your lungs. Tell your doctor if you get sensitive to the cold. Do not become pregnant while taking this medicine or for 9 months after stopping it. Women should inform their health care professional if they wish to become pregnant or think they might be pregnant. Men should not father a child while taking this medicine and for 6 months after stopping it. There is potential for serious side effects to an unborn child. Talk to your health care professional for more information. Do not breast-feed a child while taking this medicine or for 3 months after stopping it. This medicine has caused ovarian failure in some women. This medicine may make it more difficult to get pregnant. Talk to your health care professional if you are concerned about your fertility. This medicine has caused decreased sperm counts in some men. This may make it more difficult to father a child. Talk to your health care professional if you are concerned about your fertility. This medicine may increase your risk of getting an infection. Call your health care professional for advice if you get a fever, chills, or sore throat, or other symptoms of a cold or flu. Do not treat yourself. Try to avoid being around people who are sick. Avoid taking medicines that contain aspirin, acetaminophen,  ibuprofen, naproxen, or ketoprofen unless instructed by your health care professional. These medicines may hide a fever. Be careful brushing or flossing your teeth or using a toothpick because you may get an infection or bleed more easily. If you have any dental work done, tell your dentist you are receiving this medicine. What side effects may I notice from receiving this medicine? Side effects that you should report to your doctor or health care professional as soon as possible:  allergic reactions like skin rash, itching or hives, swelling of the face, lips, or tongue  breathing problems  cough  low blood counts - this medicine may decrease the number of white blood cells, red blood cells, and platelets. You may be at increased risk for infections and bleeding  nausea, vomiting  pain, redness, or irritation at site where injected  pain, tingling, numbness in the hands or feet  signs and symptoms of bleeding such as bloody or black, tarry stools; red or dark brown urine; spitting up blood or brown material that looks like coffee grounds; red spots on the skin; unusual bruising or bleeding from the eyes, gums, or nose  signs and symptoms of a dangerous change in heartbeat or heart rhythm like chest pain; dizziness; fast, irregular heartbeat; palpitations; feeling faint or lightheaded; falls  signs and symptoms of infection like fever; chills; cough; sore throat; pain or trouble  passing urine  signs and symptoms of liver injury like dark yellow or brown urine; general ill feeling or flu-like symptoms; light-colored stools; loss of appetite; nausea; right upper belly pain; unusually weak or tired; yellowing of the eyes or skin  signs and symptoms of low red blood cells or anemia such as unusually weak or tired; feeling faint or lightheaded; falls  signs and symptoms of muscle injury like dark urine; trouble passing urine or change in the amount of urine; unusually weak or tired; muscle  pain; back pain Side effects that usually do not require medical attention (report to your doctor or health care professional if they continue or are bothersome):  changes in taste  diarrhea  gas  hair loss  loss of appetite  mouth sores This list may not describe all possible side effects. Call your doctor for medical advice about side effects. You may report side effects to FDA at 1-800-FDA-1088. Where should I keep my medicine? This drug is given in a hospital or clinic and will not be stored at home. NOTE: This sheet is a summary. It may not cover all possible information. If you have questions about this medicine, talk to your doctor, pharmacist, or health care provider.  2020 Elsevier/Gold Standard (2018-05-31 12:20:35)  Leucovorin injection What is this medicine? LEUCOVORIN (loo koe VOR in) is used to prevent or treat the harmful effects of some medicines. This medicine is used to treat anemia caused by a low amount of folic acid in the body. It is also used with 5-fluorouracil (5-FU) to treat colon cancer. This medicine may be used for other purposes; ask your health care provider or pharmacist if you have questions. What should I tell my health care provider before I take this medicine? They need to know if you have any of these conditions:  anemia from low levels of vitamin B-12 in the blood  an unusual or allergic reaction to leucovorin, folic acid, other medicines, foods, dyes, or preservatives  pregnant or trying to get pregnant  breast-feeding How should I use this medicine? This medicine is for injection into a muscle or into a vein. It is given by a health care professional in a hospital or clinic setting. Talk to your pediatrician regarding the use of this medicine in children. Special care may be needed. Overdosage: If you think you have taken too much of this medicine contact a poison control center or emergency room at once. NOTE: This medicine is only for  you. Do not share this medicine with others. What if I miss a dose? This does not apply. What may interact with this medicine?  capecitabine  fluorouracil  phenobarbital  phenytoin  primidone  trimethoprim-sulfamethoxazole This list may not describe all possible interactions. Give your health care provider a list of all the medicines, herbs, non-prescription drugs, or dietary supplements you use. Also tell them if you smoke, drink alcohol, or use illegal drugs. Some items may interact with your medicine. What should I watch for while using this medicine? Your condition will be monitored carefully while you are receiving this medicine. This medicine may increase the side effects of 5-fluorouracil, 5-FU. Tell your doctor or health care professional if you have diarrhea or mouth sores that do not get better or that get worse. What side effects may I notice from receiving this medicine? Side effects that you should report to your doctor or health care professional as soon as possible:  allergic reactions like skin rash, itching or hives, swelling  of the face, lips, or tongue  breathing problems  fever, infection  mouth sores  unusual bleeding or bruising  unusually weak or tired Side effects that usually do not require medical attention (report to your doctor or health care professional if they continue or are bothersome):  constipation or diarrhea  loss of appetite  nausea, vomiting This list may not describe all possible side effects. Call your doctor for medical advice about side effects. You may report side effects to FDA at 1-800-FDA-1088. Where should I keep my medicine? This drug is given in a hospital or clinic and will not be stored at home. NOTE: This sheet is a summary. It may not cover all possible information. If you have questions about this medicine, talk to your doctor, pharmacist, or health care provider.  2020 Elsevier/Gold Standard (2007-07-18  16:50:29)  Irinotecan injection What is this medicine? IRINOTECAN (ir in oh TEE kan ) is a chemotherapy drug. It is used to treat colon and rectal cancer. This medicine may be used for other purposes; ask your health care provider or pharmacist if you have questions. COMMON BRAND NAME(S): Camptosar What should I tell my health care provider before I take this medicine? They need to know if you have any of these conditions:  dehydration  diarrhea  infection (especially a virus infection such as chickenpox, cold sores, or herpes)  liver disease  low blood counts, like low white cell, platelet, or red cell counts  low levels of calcium, magnesium, or potassium in the blood  recent or ongoing radiation therapy  an unusual or allergic reaction to irinotecan, other medicines, foods, dyes, or preservatives  pregnant or trying to get pregnant  breast-feeding How should I use this medicine? This drug is given as an infusion into a vein. It is administered in a hospital or clinic by a specially trained health care professional. Talk to your pediatrician regarding the use of this medicine in children. Special care may be needed. Overdosage: If you think you have taken too much of this medicine contact a poison control center or emergency room at once. NOTE: This medicine is only for you. Do not share this medicine with others. What if I miss a dose? It is important not to miss your dose. Call your doctor or health care professional if you are unable to keep an appointment. What may interact with this medicine? This medicine may interact with the following medications:  antiviral medicines for HIV or AIDS  certain antibiotics like rifampin or rifabutin  certain medicines for fungal infections like itraconazole, ketoconazole, posaconazole, and voriconazole  certain medicines for seizures like carbamazepine, phenobarbital, phenotoin  clarithromycin  gemfibrozil  nefazodone  St.  John's Wort This list may not describe all possible interactions. Give your health care provider a list of all the medicines, herbs, non-prescription drugs, or dietary supplements you use. Also tell them if you smoke, drink alcohol, or use illegal drugs. Some items may interact with your medicine. What should I watch for while using this medicine? Your condition will be monitored carefully while you are receiving this medicine. You will need important blood work done while you are taking this medicine. This drug may make you feel generally unwell. This is not uncommon, as chemotherapy can affect healthy cells as well as cancer cells. Report any side effects. Continue your course of treatment even though you feel ill unless your doctor tells you to stop. In some cases, you may be given additional medicines to help with  side effects. Follow all directions for their use. You may get drowsy or dizzy. Do not drive, use machinery, or do anything that needs mental alertness until you know how this medicine affects you. Do not stand or sit up quickly, especially if you are an older patient. This reduces the risk of dizzy or fainting spells. Call your health care professional for advice if you get a fever, chills, or sore throat, or other symptoms of a cold or flu. Do not treat yourself. This medicine decreases your body's ability to fight infections. Try to avoid being around people who are sick. Avoid taking products that contain aspirin, acetaminophen, ibuprofen, naproxen, or ketoprofen unless instructed by your doctor. These medicines may hide a fever. This medicine may increase your risk to bruise or bleed. Call your doctor or health care professional if you notice any unusual bleeding. Be careful brushing and flossing your teeth or using a toothpick because you may get an infection or bleed more easily. If you have any dental work done, tell your dentist you are receiving this medicine. Do not become  pregnant while taking this medicine or for 6 months after stopping it. Women should inform their health care professional if they wish to become pregnant or think they might be pregnant. Men should not father a child while taking this medicine and for 3 months after stopping it. There is potential for serious side effects to an unborn child. Talk to your health care professional for more information. Do not breast-feed an infant while taking this medicine or for 7 days after stopping it. This medicine has caused ovarian failure in some women. This medicine may make it more difficult to get pregnant. Talk to your health care professional if you are concerned about your fertility. This medicine has caused decreased sperm counts in some men. This may make it more difficult to father a child. Talk to your health care professional if you are concerned about your fertility. What side effects may I notice from receiving this medicine? Side effects that you should report to your doctor or health care professional as soon as possible:  allergic reactions like skin rash, itching or hives, swelling of the face, lips, or tongue  chest pain  diarrhea  flushing, runny nose, sweating during infusion  low blood counts - this medicine may decrease the number of white blood cells, red blood cells and platelets. You may be at increased risk for infections and bleeding.  nausea, vomiting  pain, swelling, warmth in the leg  signs of decreased platelets or bleeding - bruising, pinpoint red spots on the skin, black, tarry stools, blood in the urine  signs of infection - fever or chills, cough, sore throat, pain or difficulty passing urine  signs of decreased red blood cells - unusually weak or tired, fainting spells, lightheadedness Side effects that usually do not require medical attention (report to your doctor or health care professional if they continue or are bothersome):  constipation  hair  loss  headache  loss of appetite  mouth sores  stomach pain This list may not describe all possible side effects. Call your doctor for medical advice about side effects. You may report side effects to FDA at 1-800-FDA-1088. Where should I keep my medicine? This drug is given in a hospital or clinic and will not be stored at home. NOTE: This sheet is a summary. It may not cover all possible information. If you have questions about this medicine, talk to your doctor, pharmacist,  or health care provider.  2020 Elsevier/Gold Standard (2018-03-03 10:09:17)  Fluorouracil, 5-FU injection What is this medicine? FLUOROURACIL, 5-FU (flure oh YOOR a sil) is a chemotherapy drug. It slows the growth of cancer cells. This medicine is used to treat many types of cancer like breast cancer, colon or rectal cancer, pancreatic cancer, and stomach cancer. This medicine may be used for other purposes; ask your health care provider or pharmacist if you have questions. COMMON BRAND NAME(S): Adrucil What should I tell my health care provider before I take this medicine? They need to know if you have any of these conditions:  blood disorders  dihydropyrimidine dehydrogenase (DPD) deficiency  infection (especially a virus infection such as chickenpox, cold sores, or herpes)  kidney disease  liver disease  malnourished, poor nutrition  recent or ongoing radiation therapy  an unusual or allergic reaction to fluorouracil, other chemotherapy, other medicines, foods, dyes, or preservatives  pregnant or trying to get pregnant  breast-feeding How should I use this medicine? This drug is given as an infusion or injection into a vein. It is administered in a hospital or clinic by a specially trained health care professional. Talk to your pediatrician regarding the use of this medicine in children. Special care may be needed. Overdosage: If you think you have taken too much of this medicine contact a  poison control center or emergency room at once. NOTE: This medicine is only for you. Do not share this medicine with others. What if I miss a dose? It is important not to miss your dose. Call your doctor or health care professional if you are unable to keep an appointment. What may interact with this medicine?  allopurinol  cimetidine  dapsone  digoxin  hydroxyurea  leucovorin  levamisole  medicines for seizures like ethotoin, fosphenytoin, phenytoin  medicines to increase blood counts like filgrastim, pegfilgrastim, sargramostim  medicines that treat or prevent blood clots like warfarin, enoxaparin, and dalteparin  methotrexate  metronidazole  pyrimethamine  some other chemotherapy drugs like busulfan, cisplatin, estramustine, vinblastine  trimethoprim  trimetrexate  vaccines Talk to your doctor or health care professional before taking any of these medicines:  acetaminophen  aspirin  ibuprofen  ketoprofen  naproxen This list may not describe all possible interactions. Give your health care provider a list of all the medicines, herbs, non-prescription drugs, or dietary supplements you use. Also tell them if you smoke, drink alcohol, or use illegal drugs. Some items may interact with your medicine. What should I watch for while using this medicine? Visit your doctor for checks on your progress. This drug may make you feel generally unwell. This is not uncommon, as chemotherapy can affect healthy cells as well as cancer cells. Report any side effects. Continue your course of treatment even though you feel ill unless your doctor tells you to stop. In some cases, you may be given additional medicines to help with side effects. Follow all directions for their use. Call your doctor or health care professional for advice if you get a fever, chills or sore throat, or other symptoms of a cold or flu. Do not treat yourself. This drug decreases your body's ability to  fight infections. Try to avoid being around people who are sick. This medicine may increase your risk to bruise or bleed. Call your doctor or health care professional if you notice any unusual bleeding. Be careful brushing and flossing your teeth or using a toothpick because you may get an infection or bleed more easily.  If you have any dental work done, tell your dentist you are receiving this medicine. Avoid taking products that contain aspirin, acetaminophen, ibuprofen, naproxen, or ketoprofen unless instructed by your doctor. These medicines may hide a fever. Do not become pregnant while taking this medicine. Women should inform their doctor if they wish to become pregnant or think they might be pregnant. There is a potential for serious side effects to an unborn child. Talk to your health care professional or pharmacist for more information. Do not breast-feed an infant while taking this medicine. Men should inform their doctor if they wish to father a child. This medicine may lower sperm counts. Do not treat diarrhea with over the counter products. Contact your doctor if you have diarrhea that lasts more than 2 days or if it is severe and watery. This medicine can make you more sensitive to the sun. Keep out of the sun. If you cannot avoid being in the sun, wear protective clothing and use sunscreen. Do not use sun lamps or tanning beds/booths. What side effects may I notice from receiving this medicine? Side effects that you should report to your doctor or health care professional as soon as possible:  allergic reactions like skin rash, itching or hives, swelling of the face, lips, or tongue  low blood counts - this medicine may decrease the number of white blood cells, red blood cells and platelets. You may be at increased risk for infections and bleeding.  signs of infection - fever or chills, cough, sore throat, pain or difficulty passing urine  signs of decreased platelets or bleeding -  bruising, pinpoint red spots on the skin, black, tarry stools, blood in the urine  signs of decreased red blood cells - unusually weak or tired, fainting spells, lightheadedness  breathing problems  changes in vision  chest pain  mouth sores  nausea and vomiting  pain, swelling, redness at site where injected  pain, tingling, numbness in the hands or feet  redness, swelling, or sores on hands or feet  stomach pain  unusual bleeding Side effects that usually do not require medical attention (report to your doctor or health care professional if they continue or are bothersome):  changes in finger or toe nails  diarrhea  dry or itchy skin  hair loss  headache  loss of appetite  sensitivity of eyes to the light  stomach upset  unusually teary eyes This list may not describe all possible side effects. Call your doctor for medical advice about side effects. You may report side effects to FDA at 1-800-FDA-1088. Where should I keep my medicine? This drug is given in a hospital or clinic and will not be stored at home. NOTE: This sheet is a summary. It may not cover all possible information. If you have questions about this medicine, talk to your doctor, pharmacist, or health care provider.  2020 Elsevier/Gold Standard (2007-05-17 13:53:16)

## 2019-10-24 NOTE — Telephone Encounter (Signed)
Left patient a message to verify telephone visit for pre reg 

## 2019-10-25 ENCOUNTER — Other Ambulatory Visit: Payer: Self-pay | Admitting: Genetic Counselor

## 2019-10-25 ENCOUNTER — Encounter: Payer: Self-pay | Admitting: Hematology

## 2019-10-25 ENCOUNTER — Other Ambulatory Visit: Payer: Self-pay

## 2019-10-25 ENCOUNTER — Telehealth: Payer: Self-pay | Admitting: Nutrition

## 2019-10-25 ENCOUNTER — Telehealth: Payer: Self-pay | Admitting: Nurse Practitioner

## 2019-10-25 ENCOUNTER — Inpatient Hospital Stay: Payer: BC Managed Care – PPO

## 2019-10-25 ENCOUNTER — Inpatient Hospital Stay: Payer: BC Managed Care – PPO | Admitting: Nutrition

## 2019-10-25 ENCOUNTER — Telehealth: Payer: Self-pay | Admitting: *Deleted

## 2019-10-25 ENCOUNTER — Inpatient Hospital Stay: Payer: BC Managed Care – PPO | Admitting: Genetic Counselor

## 2019-10-25 ENCOUNTER — Other Ambulatory Visit: Payer: Self-pay | Admitting: Hematology

## 2019-10-25 ENCOUNTER — Inpatient Hospital Stay (HOSPITAL_BASED_OUTPATIENT_CLINIC_OR_DEPARTMENT_OTHER): Payer: BC Managed Care – PPO | Admitting: Genetic Counselor

## 2019-10-25 ENCOUNTER — Other Ambulatory Visit: Payer: Self-pay | Admitting: Nurse Practitioner

## 2019-10-25 DIAGNOSIS — K769 Liver disease, unspecified: Secondary | ICD-10-CM | POA: Diagnosis not present

## 2019-10-25 DIAGNOSIS — C250 Malignant neoplasm of head of pancreas: Secondary | ICD-10-CM | POA: Diagnosis not present

## 2019-10-25 DIAGNOSIS — R634 Abnormal weight loss: Secondary | ICD-10-CM | POA: Diagnosis not present

## 2019-10-25 DIAGNOSIS — M329 Systemic lupus erythematosus, unspecified: Secondary | ICD-10-CM | POA: Diagnosis not present

## 2019-10-25 DIAGNOSIS — I10 Essential (primary) hypertension: Secondary | ICD-10-CM | POA: Diagnosis not present

## 2019-10-25 DIAGNOSIS — G43909 Migraine, unspecified, not intractable, without status migrainosus: Secondary | ICD-10-CM | POA: Diagnosis not present

## 2019-10-25 DIAGNOSIS — F419 Anxiety disorder, unspecified: Secondary | ICD-10-CM | POA: Diagnosis not present

## 2019-10-25 DIAGNOSIS — R1010 Upper abdominal pain, unspecified: Secondary | ICD-10-CM | POA: Diagnosis not present

## 2019-10-25 LAB — CBC WITH DIFFERENTIAL (CANCER CENTER ONLY)
Abs Immature Granulocytes: 0.04 10*3/uL (ref 0.00–0.07)
Basophils Absolute: 0 10*3/uL (ref 0.0–0.1)
Basophils Relative: 0 %
Eosinophils Absolute: 0 10*3/uL (ref 0.0–0.5)
Eosinophils Relative: 0 %
HCT: 28.3 % — ABNORMAL LOW (ref 36.0–46.0)
Hemoglobin: 9.7 g/dL — ABNORMAL LOW (ref 12.0–15.0)
Immature Granulocytes: 0 %
Lymphocytes Relative: 10 %
Lymphs Abs: 0.9 10*3/uL (ref 0.7–4.0)
MCH: 29.8 pg (ref 26.0–34.0)
MCHC: 34.3 g/dL (ref 30.0–36.0)
MCV: 86.8 fL (ref 80.0–100.0)
Monocytes Absolute: 0.5 10*3/uL (ref 0.1–1.0)
Monocytes Relative: 5 %
Neutro Abs: 7.8 10*3/uL — ABNORMAL HIGH (ref 1.7–7.7)
Neutrophils Relative %: 85 %
Platelet Count: 299 10*3/uL (ref 150–400)
RBC: 3.26 MIL/uL — ABNORMAL LOW (ref 3.87–5.11)
RDW: 12.1 % (ref 11.5–15.5)
WBC Count: 9.2 10*3/uL (ref 4.0–10.5)
nRBC: 0 % (ref 0.0–0.2)

## 2019-10-25 LAB — CMP (CANCER CENTER ONLY)
ALT: 21 U/L (ref 0–44)
AST: 22 U/L (ref 15–41)
Albumin: 3.5 g/dL (ref 3.5–5.0)
Alkaline Phosphatase: 104 U/L (ref 38–126)
Anion gap: 8 (ref 5–15)
BUN: 15 mg/dL (ref 6–20)
CO2: 29 mmol/L (ref 22–32)
Calcium: 10.1 mg/dL (ref 8.9–10.3)
Chloride: 101 mmol/L (ref 98–111)
Creatinine: 0.95 mg/dL (ref 0.44–1.00)
GFR, Est AFR Am: >60 mL/min (ref >60)
GFR, Estimated: >60 mL/min (ref >60)
Glucose, Bld: 113 mg/dL — ABNORMAL HIGH (ref 70–99)
Potassium: 3.4 mmol/L — ABNORMAL LOW (ref 3.5–5.1)
Sodium: 138 mmol/L (ref 135–145)
Total Bilirubin: 0.4 mg/dL (ref 0.3–1.2)
Total Protein: 7.6 g/dL (ref 6.5–8.1)

## 2019-10-25 LAB — CANCER ANTIGEN 19-9: CA 19-9: 13626 U/mL — ABNORMAL HIGH (ref 0–35)

## 2019-10-25 LAB — GENETIC SCREENING ORDER

## 2019-10-25 MED ORDER — MIRTAZAPINE 15 MG PO TABS: 15.0000 mg | ORAL_TABLET | Freq: Every day | ORAL | 1 refills | Status: DC

## 2019-10-25 NOTE — Telephone Encounter (Signed)
Called pt to see how she did with her treatment yest.  She reports doing well.  She reports now sleeping the greatest last night but reviewed decadron could effects this & tonight should be better. She states her PCP is going to give her something new to help with sleep & appetite.  She has her spill kit & pump is doing ok.  She knows to call if she has concerns.

## 2019-10-25 NOTE — Telephone Encounter (Signed)
-----   Message from Rennis Harding, RN sent at 10/24/2019  5:11 PM EDT ----- Regarding: Dr. Burr Medico First Time Chemo First time chemo follow up - patient tolerated well.

## 2019-10-25 NOTE — Telephone Encounter (Signed)
Patient scheduled for telephone appointment.  59 year old female diagnosed with pancreas cancer in September 2021.  She is followed by Dr. Burr Medico.  She will receive FOLFIRINOX.  Past medical history includes asthma, DDD, hypertension, lupus, and obesity.  Medications include vitamin D, flaxseed oil, Xanax, Nexium, ferrous sulfate, Zofran, potassium, and Compazine.  Labs include sodium 134, potassium 3.1, and glucose 101 on September 29.  Height: 5 feet 7 inches. Weight: 187.7 pounds. Usual body weight: 224 pounds in March 2021. BMI: 29.4.  Patient has allergies to nuts, corn (not corn syrup), beans (dried and green), red grapefruit, oranges and some seasonings. Patient has lost 36.3 pounds since March 2021 this is 16% over 6 months which is significant. Reports poor appetite. She had some nausea today.   She would like some suggestions to increase variety in her diet.  Nutrition diagnosis: Food and nutrition related knowledge deficit related to pancreas cancer and associated treatments as evidenced by no prior need for nutrition related information.  Intervention: Patient educated to consume smaller more frequent meals and snacks with adequate calories and protein for weight maintenance. Reviewed high-protein foods and snacks. Encouraged bowel regimen. Questions were answered.  Teach back method used.   Mail fact sheets to patient.  Will provide contact information.  Monitoring, evaluation, goals: Patient will work to increase calories and protein to minimize further weight loss.  Next visit: Wednesday, October 27 during infusion.  **Disclaimer: This note was dictated with voice recognition software. Similar sounding words can inadvertently be transcribed and this note may contain transcription errors which may not have been corrected upon publication of note.**

## 2019-10-25 NOTE — Progress Notes (Signed)
Met with patient/accompanying adult at registration to introduce myself as Arboriculturist and to offer available resources.  Discussed one-time $1000 Advertising account executive and qualifcations to assist with personal expenses while going through treatment.  Gave her my card if interested in applying and for any additional financial questions or concerns.

## 2019-10-25 NOTE — Telephone Encounter (Signed)
Scheduled per 9/29 los. Pt is aware of appt times and dates.

## 2019-10-26 ENCOUNTER — Inpatient Hospital Stay: Payer: BC Managed Care – PPO | Attending: Hematology

## 2019-10-26 ENCOUNTER — Other Ambulatory Visit: Payer: Self-pay

## 2019-10-26 ENCOUNTER — Encounter: Payer: Self-pay | Admitting: Genetic Counselor

## 2019-10-26 ENCOUNTER — Telehealth: Payer: Self-pay | Admitting: Nurse Practitioner

## 2019-10-26 DIAGNOSIS — R112 Nausea with vomiting, unspecified: Secondary | ICD-10-CM | POA: Insufficient documentation

## 2019-10-26 DIAGNOSIS — R531 Weakness: Secondary | ICD-10-CM | POA: Insufficient documentation

## 2019-10-26 DIAGNOSIS — E860 Dehydration: Secondary | ICD-10-CM | POA: Diagnosis not present

## 2019-10-26 DIAGNOSIS — M329 Systemic lupus erythematosus, unspecified: Secondary | ICD-10-CM | POA: Diagnosis not present

## 2019-10-26 DIAGNOSIS — F419 Anxiety disorder, unspecified: Secondary | ICD-10-CM | POA: Insufficient documentation

## 2019-10-26 DIAGNOSIS — G43909 Migraine, unspecified, not intractable, without status migrainosus: Secondary | ICD-10-CM | POA: Diagnosis not present

## 2019-10-26 DIAGNOSIS — Z5189 Encounter for other specified aftercare: Secondary | ICD-10-CM | POA: Insufficient documentation

## 2019-10-26 DIAGNOSIS — J939 Pneumothorax, unspecified: Secondary | ICD-10-CM | POA: Insufficient documentation

## 2019-10-26 DIAGNOSIS — Z95828 Presence of other vascular implants and grafts: Secondary | ICD-10-CM

## 2019-10-26 DIAGNOSIS — I10 Essential (primary) hypertension: Secondary | ICD-10-CM | POA: Diagnosis not present

## 2019-10-26 DIAGNOSIS — R634 Abnormal weight loss: Secondary | ICD-10-CM | POA: Diagnosis not present

## 2019-10-26 DIAGNOSIS — Z5111 Encounter for antineoplastic chemotherapy: Secondary | ICD-10-CM | POA: Diagnosis not present

## 2019-10-26 DIAGNOSIS — R109 Unspecified abdominal pain: Secondary | ICD-10-CM | POA: Insufficient documentation

## 2019-10-26 DIAGNOSIS — C250 Malignant neoplasm of head of pancreas: Secondary | ICD-10-CM | POA: Insufficient documentation

## 2019-10-26 DIAGNOSIS — Z7189 Other specified counseling: Secondary | ICD-10-CM

## 2019-10-26 MED ORDER — PEGFILGRASTIM-CBQV 6 MG/0.6ML ~~LOC~~ SOSY
6.0000 mg | PREFILLED_SYRINGE | Freq: Once | SUBCUTANEOUS | Status: AC
  Administered 2019-10-26: 6 mg via SUBCUTANEOUS

## 2019-10-26 MED ORDER — PEGFILGRASTIM-CBQV 6 MG/0.6ML ~~LOC~~ SOSY
PREFILLED_SYRINGE | SUBCUTANEOUS | Status: AC
  Filled 2019-10-26: qty 0.6

## 2019-10-26 MED ORDER — HEPARIN SOD (PORK) LOCK FLUSH 100 UNIT/ML IV SOLN
500.0000 [IU] | Freq: Once | INTRAVENOUS | Status: AC
  Administered 2019-10-26: 500 [IU]
  Filled 2019-10-26: qty 5

## 2019-10-26 MED ORDER — SODIUM CHLORIDE 0.9% FLUSH
10.0000 mL | Freq: Once | INTRAVENOUS | Status: AC
  Administered 2019-10-26: 10 mL
  Filled 2019-10-26: qty 10

## 2019-10-26 NOTE — Patient Instructions (Signed)
Central Line, Adult A central line is a thin, flexible tube (catheter) that is put in your vein. It can be used to:  Give you medicine.  Give you food and nutrients. Follow these instructions at home: Caring for the tube   Follow instructions from your doctor about: ? Flushing the tube with saline solution. ? Cleaning the tube and the area around it.  Only flush with clean (sterile) supplies. The supplies should be from your doctor, a pharmacy, or another place that your doctor recommends.  Before you flush the tube or clean the area around the tube: ? Wash your hands with soap and water. If you cannot use soap and water, use hand sanitizer. ? Clean the central line hub with rubbing alcohol. Caring for your skin  Keep the area where the tube was put in clean and dry.  Every day, and when changing the bandage, check the skin around the central line for: ? Redness, swelling, or pain. ? Fluid or blood. ? Warmth. ? Pus. ? A bad smell. General instructions  Keep the tube clamped, unless it is being used.  Keep your supplies in a clean, dry location.  If you or someone else accidentally pulls on the tube, make sure: ? The bandage (dressing) is okay. ? There is no bleeding. ? The tube has not been pulled out.  Do not use scissors or sharp objects near the tube.  Do not swim or let the tube soak in a tub.  Ask your doctor what activities are safe for you. Your doctor may tell you not to lift anything or move your arm too much.  Take over-the-counter and prescription medicines only as told by your doctor.  Change bandages as told by your doctor.  Keep your bandage dry. If a bandage gets wet, have it changed right away.  Keep all follow-up visits as told by your doctor. This is important. Throwing away supplies  Throw away any syringes in a trash (disposal) container that is only for sharp items (sharps container). You can buy a sharps container from a pharmacy, or you  can make one by using an empty hard plastic bottle with a cover.  Place any used bandages or infusion bags into a plastic bag. Throw that bag in the trash. Contact a doctor if:  You have any of these where the tube was put in: ? Redness, swelling, or pain. ? Fluid or blood. ? A warm feeling. ? Pus or a bad smell. Get help right away if:  You have: ? A fever. ? Chills. ? Trouble getting enough air (shortness of breath). ? Trouble breathing. ? Pain in your chest. ? Swelling in your neck, face, chest, or arm.  You are coughing.  You feel your heart beating fast or skipping beats.  You feel dizzy or you pass out (faint).  There are red lines coming from where the tube was put in.  The area where the tube was put in is bleeding and the bleeding will not stop.  Your tube is hard to flush.  You do not get a blood return from the tube.  The tube gets loose or comes out.  The tube has a hole or a tear.  The tube leaks. Summary  A central line is a thin, flexible tube (catheter) that is put in your vein. It can be used to take blood for lab tests or to give you medicine.  Follow instructions from your doctor about flushing and cleaning the   tube.  Keep the area where the tube was put in clean and dry.  Ask your doctor what activities are safe for you. This information is not intended to replace advice given to you by your health care provider. Make sure you discuss any questions you have with your health care provider. Document Revised: 05/03/2018 Document Reviewed: 01/29/2016 Elsevier Patient Education  [place].  Pegfilgrastim injection What is this medicine? PEGFILGRASTIM (PEG fil gra stim) is a long-acting granulocyte colony-stimulating factor that stimulates the growth of neutrophils, a type of white blood cell important in the body's fight against infection. It is used to reduce the incidence of fever and infection in patients with certain types of cancer  who are receiving chemotherapy that affects the bone marrow, and to increase survival after being exposed to high doses of radiation. This medicine may be used for other purposes; ask your health care provider or pharmacist if you have questions. COMMON BRAND NAME(S): Steve Rattler, Ziextenzo What should I tell my health care provider before I take this medicine? They need to know if you have any of these conditions:  kidney disease  latex allergy  ongoing radiation therapy  sickle cell disease  skin reactions to acrylic adhesives (On-Body Injector only)  an unusual or allergic reaction to pegfilgrastim, filgrastim, other medicines, foods, dyes, or preservatives  pregnant or trying to get pregnant  breast-feeding How should I use this medicine? This medicine is for injection under the skin. If you get this medicine at home, you will be taught how to prepare and give the pre-filled syringe or how to use the On-body Injector. Refer to the patient Instructions for Use for detailed instructions. Use exactly as directed. Tell your healthcare provider immediately if you suspect that the On-body Injector may not have performed as intended or if you suspect the use of the On-body Injector resulted in a missed or partial dose. It is important that you put your used needles and syringes in a special sharps container. Do not put them in a trash can. If you do not have a sharps container, call your pharmacist or healthcare provider to get one. Talk to your pediatrician regarding the use of this medicine in children. While this drug may be prescribed for selected conditions, precautions do apply. Overdosage: If you think you have taken too much of this medicine contact a poison control center or emergency room at once. NOTE: This medicine is only for you. Do not share this medicine with others. What if I miss a dose? It is important not to miss your dose. Call your doctor or health care  professional if you miss your dose. If you miss a dose due to an On-body Injector failure or leakage, a new dose should be administered as soon as possible using a single prefilled syringe for manual use. What may interact with this medicine? Interactions have not been studied. Give your health care provider a list of all the medicines, herbs, non-prescription drugs, or dietary supplements you use. Also tell them if you smoke, drink alcohol, or use illegal drugs. Some items may interact with your medicine. This list may not describe all possible interactions. Give your health care provider a list of all the medicines, herbs, non-prescription drugs, or dietary supplements you use. Also tell them if you smoke, drink alcohol, or use illegal drugs. Some items may interact with your medicine. What should I watch for while using this medicine? You may need blood work done while you  are taking this medicine. If you are going to need a MRI, CT scan, or other procedure, tell your doctor that you are using this medicine (On-Body Injector only). What side effects may I notice from receiving this medicine? Side effects that you should report to your doctor or health care professional as soon as possible:  allergic reactions like skin rash, itching or hives, swelling of the face, lips, or tongue  back pain  dizziness  fever  pain, redness, or irritation at site where injected  pinpoint red spots on the skin  red or dark-brown urine  shortness of breath or breathing problems  stomach or side pain, or pain at the shoulder  swelling  tiredness  trouble passing urine or change in the amount of urine Side effects that usually do not require medical attention (report to your doctor or health care professional if they continue or are bothersome):  bone pain  muscle pain This list may not describe all possible side effects. Call your doctor for medical advice about side effects. You may report side  effects to FDA at 1-800-FDA-1088. Where should I keep my medicine? Keep out of the reach of children. If you are using this medicine at home, you will be instructed on how to store it. Throw away any unused medicine after the expiration date on the label. NOTE: This sheet is a summary. It may not cover all possible information. If you have questions about this medicine, talk to your doctor, pharmacist, or health care provider.  2020 Elsevier/Gold Standard (2017-04-18 16:57:08)

## 2019-10-26 NOTE — Progress Notes (Signed)
Patient called to get clarification on her medications.  I reviewed the compazine, zofran, remeron and potassium with her usage, dose, how and when to take.  She verbalized an understanding.

## 2019-10-26 NOTE — Telephone Encounter (Signed)
Contacted patient to verify telephone visit for pre reg °

## 2019-10-26 NOTE — Progress Notes (Signed)
REFERRING PROVIDER: Truitt Merle, MD [place],  [place] [address]  PRIMARY PROVIDER:  Josetta Huddle, MD  PRIMARY REASON FOR VISIT:  1. Malignant neoplasm of head of pancreas ([place])    HISTORY OF PRESENT ILLNESS:   Cynthia Frye, a 59 y.o. female, was seen for a [place] cancer genetics consultation at the request of Dr. Burr Medico due to a personal history of pancreatic cancer.  Cynthia Frye presents to clinic today with her husband to discuss the possibility of a hereditary predisposition to cancer, genetic testing, and to further clarify her future cancer risks, as well as potential cancer risks for family members.   In September 2021, at the age of 89, Cynthia Frye was diagnosed with pancreatic adenocarcinoma. The treatment plan includes chemotherapy.   CANCER HISTORY:  Oncology History Overview Note  Cancer Staging Pancreatic cancer [facility]) Staging form: Exocrine Pancreas, AJCC 8th Edition - Clinical stage from 10/18/2019: Stage IV (cT3, cN1, cM1) - Signed by Truitt Merle, MD on 10/18/2019    Pancreatic cancer ([place])  09/19/2019 Imaging   CT AP w contrast 09/19/19  IMPRESSION: 1. Findings are highly concerning for probable primary pancreatic adenocarcinoma in the anterior aspect of the pancreatic head. Several prominent borderline enlarged lymph nodes are noted in the hepatoduodenal nodal station, and there are multiple indeterminate liver lesions which are highly concerning for probable hepatic metastases. Further evaluation with nonemergent abdominal MRI with and without IV gadolinium with MRCP is recommended in the near future to better evaluate these findings.   09/25/2019 Procedure   Upper EUS by Dr Paulita Fujita  IMPRESSION -There was no evidence of significant pathology in the left lobe of the liver.  -A few lymph nodes were visualized and measures in the peripancreatic region and porta hepata region.  -Hyperchoic material consistent with sludge was visualized  endosonographically in the gallbladder.  -There was no sign of significant pathology in the common bile duct.  -A mass was identified in the pancreatic head. If biopsy results show adenocarcinoma, it would be staged T3N1Mx by endosonographic criteria. Fine needle aspiration performed.    09/25/2019 Initial Biopsy   FINAL MICROSCOPIC DIAGNOSIS: Fine needle aspirate, Pancreas;  MALIGNANT CELLS PRESENT CONSISTENT WITH ADENOCARCINOMA.    10/02/2019 Initial Diagnosis   Pancreatic cancer ([place])   10/11/2019 Procedure   PAC placement y Dr Barry Dienes    10/15/2019 Imaging   CT Chest  IMPRESSION: 1. Small anterior left pneumothorax with dependent atelectasis in the left lower lobe. 2. Increased number of bilateral axillary and subpectoral lymph nodes with mild lymphadenopathy in the left axilla. While this would be an atypical presentation for metastatic pancreatic cancer, this possibility is not excluded 3. Main duct dilatation in the pancreas, better assessed on abdomen CT 09/19/2019.   10/18/2019 Cancer Staging   Staging form: Exocrine Pancreas, AJCC 8th Edition - Clinical stage from 10/18/2019: Stage IV (cT3, cN1, cM1) - Signed by Truitt Merle, MD on 10/18/2019   10/24/2019 -  Chemotherapy   The patient had dexamethasone (DECADRON) 4 MG tablet, 8 mg, Oral, Daily, 1 of 1 cycle, Start date: --, End date: -- palonosetron (ALOXI) injection 0.25 mg, 0.25 mg, Intravenous,  Once, 1 of 4 cycles Administration: 0.25 mg (10/24/2019) pegfilgrastim-cbqv (UDENYCA) injection 6 mg, 6 mg, Subcutaneous, Once, 1 of 4 cycles irinotecan (CAMPTOSAR) 300 mg in sodium chloride 0.9 % 500 mL chemo infusion, 150 mg/m2 = 300 mg, Intravenous,  Once, 1 of 4 cycles Administration: 300 mg (10/24/2019) oxaliplatin (ELOXATIN) 170 mg in dextrose 5 %  500 mL chemo infusion, 85 mg/m2 = 170 mg, Intravenous,  Once, 1 of 4 cycles Administration: 170 mg (10/24/2019) fosaprepitant (EMEND) 150 mg in sodium chloride 0.9 % 145 mL IVPB, 150 mg,  Intravenous,  Once, 1 of 4 cycles Administration: 150 mg (10/24/2019) fluorouracil (ADRUCIL) 4,800 mg in sodium chloride 0.9 % 54 mL chemo infusion, 2,400 mg/m2 = 4,800 mg, Intravenous, 1 Day/Dose, 1 of 4 cycles Administration: 4,800 mg (10/24/2019) leucovorin 804 mg in sodium chloride 0.9 % 250 mL infusion, 400 mg/m2 = 804 mg, Intravenous,  Once, 1 of 4 cycles Administration: 804 mg (10/24/2019)  for chemotherapy treatment.       RISK FACTORS:  Menarche was at age 83.  Nulliparous.  OCP use for approximately 0 years.  Ovaries intact: yes.  Hysterectomy: yes at age 2 due to fibroids Menopausal status: postmenopausal.  HRT use: 0 years. Colonoscopy: yes; most recent in 2014; return in 10 years. Mammogram within the last year: yes; most recent 01/2019 Number of breast biopsies: 0. Up to date with pelvic exams: due this year per patient. Any excessive radiation exposure in the past: no  Past Medical History:  Diagnosis Date  . Allergies    peanuts, corn, beans, red grapefruit, naval oranges  . Asthma    allergy shots and medication  . DDD (degenerative disc disease), lumbar   . Eustachian tube dysfunction 12/2012   rhinitis, vertigo- Dr. Wilburn Cornelia, ENT   . Fibroids   . Heart murmur    Echo 1/18: EF 55-60, no RWMA, normal diastolic function, trivial AI, PASP 32  . History of cardiac catheterization    LHC 3/18: normal coronary arteries.   . History of nuclear stress test    ETT-Myoview 2/18: EF 62, + ECG response; apical and distal septal ischemia; intermediate risk.  Marland Kitchen Hypertension   . Iron deficiency anemia   . Lupus [facility]) 2011   Dr. Trudie Reed  . Migraine headache    trial of generic maxalt 10 mg, January 2021  . Obesity   . Seasonal allergies   . Seizure in childhood [facility])    as a child no treatment none x 30 years    Past Surgical History:  Procedure Laterality Date  . ABDOMINAL HYSTERECTOMY    . BACK SURGERY  2009  . COLONOSCOPY WITH PROPOFOL N/A 04/11/2012    Procedure: COLONOSCOPY WITH PROPOFOL;  Surgeon: Garlan Fair, MD;  Location: [facility] ENDOSCOPY;  Service: Endoscopy;  Laterality: N/A;  . HERNIA REPAIR     as child unbilical  . B3-A partial discectomy and laminectomy     Dr. Christella Noa  . LEFT HEART CATH AND CORONARY ANGIOGRAPHY N/A 04/09/2016   Procedure: Left Heart Cath and Coronary Angiography;  Surgeon: Nelva Bush, MD;  Location: [place] CV LAB;  Service: Cardiovascular;  Laterality: N/A;  . MOUTH SURGERY    . PORTACATH PLACEMENT N/A 10/11/2019   Procedure: INSERTION PORT-A-CATH LEFT SUBCLAVIAN;  Surgeon: [name], MD;  Location: [place];  Service: General;  Laterality: N/A;    Social History   Socioeconomic History  . Marital status: Married    Spouse name: Not on file  . Number of children: 0  . Years of education: Not on file  . Highest education level: Some college, no degree  Occupational History  . Occupation: Customer service manager   Tobacco Use  . Smoking status: Never Smoker  . Smokeless tobacco: Never Used  Vaping Use  . Vaping Use: Never used  Substance and Sexual Activity  .  Alcohol use: No  . Drug use: No  . Sexual activity: Not on file  Other Topics Concern  . 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 Difficulty of Paying Living Expenses: Not on file  Food Insecurity:   . Worried About Charity fundraiser in the Last Year: Not on file  . Ran Out of Food in the Last Year: Not on file  Transportation Needs:   . Lack of Transportation (Medical): Not on file  . Lack of Transportation (Non-Medical): Not on file  Physical Activity:   . Days of Exercise per Week: Not on file  . Minutes of Exercise per Session: Not on  file  Stress:   . Feeling of Stress : Not on file  Social Connections:   . Frequency of Communication with Friends and Family: Not on file  . Frequency of Social Gatherings with Friends and Family: Not on file  . Attends Religious Services: Not on file  . Active Member of Clubs or Organizations: Not on file  . Attends Archivist Meetings: Not on file  . Marital Status: Not on file     FAMILY HISTORY:  We obtained a detailed, 4-generation family history.  Significant diagnoses are listed below: Family History  Problem Relation Age of Onset  . Cancer Niece        paternal half sister's daughter; unknown type; unknown age diagnosed    Cynthia Frye has one brother, age 84, who does not have a history of cancer.  She has one paternal half sister, in her 73s or 37s.  Cynthia Frye reported that one of her sisters daughters was diagnosed with cancer of an unknown type.  Cynthia Frye mother passed away at age 66 and did not have cancer.  Cynthia Frye had limited information about her maternal family members, and no maternal family history of cancer was reported. Cynthia Frye father passed away in his 29s and did not have cancer.  Cynthia Frye had limited information about her paternal family members.   Cynthia Frye is unaware of previous family history of genetic testing for hereditary cancer risks. Patient's maternal ancestors are of African American descent, and paternal ancestors are of African American descent. There is no reported Ashkenazi Jewish ancestry. There is no known consanguinity.  GENETIC COUNSELING ASSESSMENT: Cynthia Frye is a 59 y.o. female with a personal history of pancreatic cancer which is somewhat suggestive of a hereditary cancer syndrome and predisposition to cancer. We, therefore, discussed and recommended the following at today's visit.   DISCUSSION: We discussed that 5 - 10% of cancer is hereditary.  Some cases of hereditary pancreatic cancer are associated with mutations  in BRCA1/2.  There are other genes that can be associated with hereditary pancreatic cancer syndromes.  Type of cancer risk and level of cancer risk are gene-specific.  We discussed that testing is beneficial for several reasons including identifying whether potential treatment options, such as PARP inhibitors, would be beneficial, and understanding if other family members could be at risk for cancer and allowing them to undergo genetic testing.   We reviewed the characteristics, features and inheritance patterns of hereditary  cancer syndromes. We also discussed genetic testing, including the appropriate family members to test, the process of testing, insurance coverage and turn-around-time for results. We discussed the implications of a negative, positive, carrier and/or variant of uncertain significant result. We recommended Cynthia Frye pursue genetic testing for a panel that includes genes associated with pancreatic cancer.   Cynthia Frye  was offered a common hereditary cancer panel (48 genes) and an expanded pan-cancer panel (85 genes). Cynthia Frye was informed of the benefits and limitations of each panel, including that expanded pan-cancer panels contain several preliminary evidence genes that do not have clear management guidelines at this point in time.  We also discussed that as the number of genes included on a panel increases, the chances of variants of uncertain significance increases.  After considering the benefits and limitations of each gene panel, Cynthia Frye  elected to have common hereditary cancers panel through [facility].  The Common Hereditary Cancers Panel offered by [facility] includes sequencing and/or deletion duplication testing of the following 48 genes: APC, ATM, AXIN2, BARD1, BMPR1A, BRCA1, BRCA2, BRIP1, CDH1, CDK4, CDKN2A (p14ARF), CDKN2A (p16INK4a), CHEK2, CTNNA1, DICER1, EPCAM (Deletion/duplication testing only), GREM1 (promoter region deletion/duplication testing only), KIT, MEN1,  MLH1, MSH2, MSH3, MSH6, MUTYH, NBN, NF1, NHTL1, PALB2, PDGFRA, PMS2, POLD1, POLE, PTEN, RAD50, RAD51C, RAD51D, RNF43, SDHB, SDHC, SDHD, SMAD4, SMARCA4. STK11, TP53, TSC1, TSC2, and VHL.  The following genes were evaluated for sequence changes only: SDHA and HOXB13 c.251G>A variant only.  Based on Cynthia Frye's personal history of pancreatic cancer, she meets NCCN medical criteria for genetic testing. Despite that she meets criteria, she may still have an out of pocket cost. We discussed [facility]'s sponsored genetic testing program for those with pancreatic cancer. Cynthia Frye was informed that, through this program, [facility] offers testing at no cost to the patient and may elect to share de-identified patient information to third parties and commercial organizations.  After considering the sponsored program and being informed of other cost options, Cynthia Frye elected to proceed with the genetic test through the sponsored program called [facility] Detect Pancreatic Cancer.   PLAN: After considering the risks, benefits, and limitations, Ms. Schwan provided informed consent to pursue genetic testing and the blood sample was sent to [facility] for analysis of the Common Hereditary Cancers panel. Results should be available within approximately 3 weeks' time, at which point they will be disclosed by telephone to Ms. Bergdoll, as will any additional recommendations warranted by these results. Ms. Cl[place]addle will receive a summary of her genetic counseling visit and a copy of her results once available. This information will also be available in Epic.   Lastly, we encouraged Ms. Inga to remain in contact with cancer genetics annually so that we can continuously update the family history and inform her of any changes in cancer genetics and testing that may be of benefit for this family.   Ms. Croslin questions were answered to her satisfaction today. Our contact information was provided should additional questions  or concerns arise. Thank you for the referral and allowing [place] to share in the care of your patient.   Nari Vannatter M. Joette Catching, [place], [facility] Certified Film/video [email or web address] (P) [phone]  The patient was seen for a total of 35 minutes in face-to-face genetic counseling.  This patient was discussed with Drs. Magrinat, Lindi Adie and/or Burr Medico who agrees with the above.   _______________________________________________________________________ For Office Staff:  Number of people involved in session: 1 Was an Intern/ student involved with case: no

## 2019-10-28 ENCOUNTER — Other Ambulatory Visit: Payer: Self-pay

## 2019-10-28 ENCOUNTER — Encounter (HOSPITAL_COMMUNITY): Payer: Self-pay | Admitting: Emergency Medicine

## 2019-10-28 ENCOUNTER — Emergency Department (HOSPITAL_COMMUNITY)
Admission: EM | Admit: 2019-10-28 | Discharge: 2019-10-29 | Disposition: A | Discharge status: Home / Self Care | Payer: BC Managed Care – PPO | Attending: Emergency Medicine | Admitting: Emergency Medicine

## 2019-10-28 ENCOUNTER — Emergency Department (HOSPITAL_COMMUNITY): Payer: BC Managed Care – PPO

## 2019-10-28 DIAGNOSIS — J45909 Unspecified asthma, uncomplicated: Secondary | ICD-10-CM | POA: Diagnosis not present

## 2019-10-28 DIAGNOSIS — D72829 Elevated white blood cell count, unspecified: Secondary | ICD-10-CM | POA: Insufficient documentation

## 2019-10-28 DIAGNOSIS — R531 Weakness: Secondary | ICD-10-CM | POA: Diagnosis not present

## 2019-10-28 DIAGNOSIS — Z7982 Long term (current) use of aspirin: Secondary | ICD-10-CM | POA: Diagnosis not present

## 2019-10-28 DIAGNOSIS — I10 Essential (primary) hypertension: Secondary | ICD-10-CM | POA: Insufficient documentation

## 2019-10-28 DIAGNOSIS — E876 Hypokalemia: Secondary | ICD-10-CM | POA: Diagnosis not present

## 2019-10-28 DIAGNOSIS — C259 Malignant neoplasm of pancreas, unspecified: Secondary | ICD-10-CM | POA: Diagnosis not present

## 2019-10-28 DIAGNOSIS — R630 Anorexia: Secondary | ICD-10-CM | POA: Diagnosis not present

## 2019-10-28 DIAGNOSIS — N3000 Acute cystitis without hematuria: Secondary | ICD-10-CM

## 2019-10-28 DIAGNOSIS — R918 Other nonspecific abnormal finding of lung field: Secondary | ICD-10-CM | POA: Diagnosis not present

## 2019-10-28 DIAGNOSIS — Z79899 Other long term (current) drug therapy: Secondary | ICD-10-CM | POA: Diagnosis not present

## 2019-10-28 HISTORY — DX: Malignant (primary) neoplasm, unspecified: C80.1

## 2019-10-28 LAB — COMPREHENSIVE METABOLIC PANEL
ALT: 43 U/L (ref 0–44)
AST: 47 U/L — ABNORMAL HIGH (ref 15–41)
Albumin: 3.6 g/dL (ref 3.5–5.0)
Alkaline Phosphatase: 136 U/L — ABNORMAL HIGH (ref 38–126)
Anion gap: 11 (ref 5–15)
BUN: 12 mg/dL (ref 6–20)
CO2: 28 mmol/L (ref 22–32)
Calcium: 9.2 mg/dL (ref 8.9–10.3)
Chloride: 97 mmol/L — ABNORMAL LOW (ref 98–111)
Creatinine, Ser: 0.71 mg/dL (ref 0.44–1.00)
GFR calc Af Amer: >60 mL/min (ref >60)
GFR calc non Af Amer: >60 mL/min (ref >60)
Glucose, Bld: 102 mg/dL — ABNORMAL HIGH (ref 70–99)
Potassium: 2.8 mmol/L — ABNORMAL LOW (ref 3.5–5.1)
Sodium: 136 mmol/L (ref 135–145)
Total Bilirubin: 1.1 mg/dL (ref 0.3–1.2)
Total Protein: 7.3 g/dL (ref 6.5–8.1)

## 2019-10-28 LAB — URINALYSIS, ROUTINE W REFLEX MICROSCOPIC
Bilirubin Urine: NEGATIVE
Glucose, UA: NEGATIVE mg/dL
Hgb urine dipstick: NEGATIVE
Ketones, ur: 5 mg/dL — AB
Nitrite: NEGATIVE
Protein, ur: NEGATIVE mg/dL
Specific Gravity, Urine: 1.009 (ref 1.005–1.030)
WBC, UA: >50 WBC/hpf — ABNORMAL HIGH (ref 0–5)
pH: 6 (ref 5.0–8.0)

## 2019-10-28 LAB — CBC WITH DIFFERENTIAL/PLATELET
Abs Immature Granulocytes: 6.46 10*3/uL — ABNORMAL HIGH (ref 0.00–0.07)
Basophils Absolute: 0.1 10*3/uL (ref 0.0–0.1)
Basophils Relative: 0 %
Eosinophils Absolute: 0.1 10*3/uL (ref 0.0–0.5)
Eosinophils Relative: 1 %
HCT: 28.6 % — ABNORMAL LOW (ref 36.0–46.0)
Hemoglobin: 9.9 g/dL — ABNORMAL LOW (ref 12.0–15.0)
Immature Granulocytes: 25 %
Lymphocytes Relative: 5 %
Lymphs Abs: 1.2 10*3/uL (ref 0.7–4.0)
MCH: 30 pg (ref 26.0–34.0)
MCHC: 34.6 g/dL (ref 30.0–36.0)
MCV: 86.7 fL (ref 80.0–100.0)
Monocytes Absolute: 0.2 10*3/uL (ref 0.1–1.0)
Monocytes Relative: 1 %
Neutro Abs: 18.1 10*3/uL — ABNORMAL HIGH (ref 1.7–7.7)
Neutrophils Relative %: 68 %
Platelets: 228 10*3/uL (ref 150–400)
RBC: 3.3 MIL/uL — ABNORMAL LOW (ref 3.87–5.11)
RDW: 12.2 % (ref 11.5–15.5)
WBC: 26.2 10*3/uL — ABNORMAL HIGH (ref 4.0–10.5)
nRBC: 0 % (ref 0.0–0.2)

## 2019-10-28 LAB — LIPASE, BLOOD: Lipase: 38 U/L (ref 11–51)

## 2019-10-28 MED ORDER — POTASSIUM CHLORIDE 10 MEQ/100ML IV SOLN
10.0000 meq | INTRAVENOUS | Status: AC
  Administered 2019-10-28 – 2019-10-29 (×3): 10 meq via INTRAVENOUS
  Filled 2019-10-28 (×3): qty 100

## 2019-10-28 MED ORDER — SODIUM CHLORIDE 0.9 % IV BOLUS
1000.0000 mL | Freq: Once | INTRAVENOUS | Status: AC
  Administered 2019-10-28: 1000 mL via INTRAVENOUS

## 2019-10-28 MED ORDER — ONDANSETRON HCL 4 MG/2ML IJ SOLN
4.0000 mg | Freq: Once | INTRAMUSCULAR | Status: AC
  Administered 2019-10-28: 4 mg via INTRAVENOUS
  Filled 2019-10-28: qty 2

## 2019-10-28 NOTE — ED Triage Notes (Signed)
Pt recently dx with pancreatic cancer and started on treatment Wed. Pt reports increasing weakness and possible dehydration.

## 2019-10-28 NOTE — ED Provider Notes (Addendum)
[place] DEPT Provider Note   CSN: [id number] Arrival date & time: 10/28/19  1952     History Chief Complaint  Patient presents with  . Weakness    Cynthia Frye is a 59 y.o. female.  Chief complaint poor appetite and unable to keep fluids down.  Recent diagnosis of pancreatic cancer.  Started chemotherapy on Wednesday.  No fever, chills, chest pain, dyspnea, black stool.  Severity is moderate.  Nothing makes symptoms better or worse.  Patient given dose of "white blood cells" on Friday.        Past Medical History:  Diagnosis Date  . Allergies    peanuts, corn, beans, red grapefruit, naval oranges  . Asthma    allergy shots and medication  . Cancer [facility])    pancreatic  . DDD (degenerative disc disease), lumbar   . Eustachian tube dysfunction 12/2012   rhinitis, vertigo- Dr. Wilburn Cornelia, ENT   . Fibroids   . Heart murmur    Echo 1/18: EF 55-60, no RWMA, normal diastolic function, trivial AI, PASP 32  . History of cardiac catheterization    LHC 3/18: normal coronary arteries.   . History of nuclear stress test    ETT-Myoview 2/18: EF 62, + ECG response; apical and distal septal ischemia; intermediate risk.  Marland Kitchen Hypertension   . Iron deficiency anemia   . Lupus [facility]) 2011   Dr. Trudie Reed  . Migraine headache    trial of generic maxalt 10 mg, January 2021  . Obesity   . Seasonal allergies   . Seizure in childhood [facility])    as a child no treatment none x 30 years    Patient Active Problem List   Diagnosis Date Noted  . Port-A-Cath in place 10/24/2019  . Goals of care, counseling/discussion 10/18/2019  . Pancreatic cancer ([place]) 10/02/2019  . Chronic migraine without aura without status migrainosus, not intractable 04/23/2019  . Migraine with aura and without status migrainosus, not intractable 04/23/2019  . Essential hypertension 04/29/2016  . Lupus (systemic lupus erythematosus) ([place]) 04/29/2016  . Asthma 04/29/2016  . Seasonal  allergies 04/29/2016  . Abnormal stress test 04/09/2016    Past Surgical History:  Procedure Laterality Date  . ABDOMINAL HYSTERECTOMY    . BACK SURGERY  2009  . COLONOSCOPY WITH PROPOFOL N/A 04/11/2012   Procedure: COLONOSCOPY WITH PROPOFOL;  Surgeon: Garlan Fair, MD;  Location: [facility] ENDOSCOPY;  Service: Endoscopy;  Laterality: N/A;  . HERNIA REPAIR     as child unbilical  . D9-M partial discectomy and laminectomy     Dr. Christella Noa  . LEFT HEART CATH AND CORONARY ANGIOGRAPHY N/A 04/09/2016   Procedure: Left Heart Cath and Coronary Angiography;  Surgeon: Nelva Bush, MD;  Location: [place] CV LAB;  Service: Cardiovascular;  Laterality: N/A;  . MOUTH SURGERY    . PORTACATH PLACEMENT N/A 10/11/2019   Procedure: INSERTION PORT-A-CATH LEFT SUBCLAVIAN;  Surgeon: [name], MD;  Location: [place];  Service: General;  Laterality: N/A;     OB History   No obstetric history on file.     Family History  Problem Relation Age of Onset  . Asthma Mother   . Diabetes Mother   . Hypertension Mother   . Heart attack Father 50  . CAD Father   . Stroke Maternal Aunt   . Stroke Paternal Aunt   . Stroke Paternal Uncle   . Gout Other        uncles   .  Cancer Niece        paternal half sister's daughter; unknown type; unknown age diagnosed  . Ovarian cancer Neg Hx   . Breast cancer Neg Hx   . Colon cancer Neg Hx   . Migraines Neg Hx     Social History   Tobacco Use  . Smoking status: Never Smoker  . Smokeless tobacco: Never Used  Vaping Use  . Vaping Use: Never used  Substance Use Topics  . Alcohol use: No  . Drug use: No    Home Medications Prior to Admission medications   Medication Sig Start Date End Date Taking? Authorizing Provider  acetaminophen (TYLENOL) 500 MG tablet Take 1,000 mg by mouth every 6 (six) hours as needed for moderate pain.   Yes [provider]  albuterol (PROVENTIL HFA;VENTOLIN HFA) 108 (90 Base) MCG/ACT inhaler  Inhale 2 puffs into the lungs every 6 (six) hours as needed for wheezing or shortness of breath. 04/29/16  Yes Harrison Mons, PA  aspirin 81 MG tablet Take 81 mg by mouth daily.   Yes [provider]  Bepotastine Besilate (BEPREVE) 1.5 % SOLN Place 1 drop into both eyes 2 (two) times daily as needed (DRY EYES).   Yes [provider]  cetirizine (ZYRTEC) 10 MG chewable tablet Chew 10 mg by mouth daily.   Yes [provider]  Cholecalciferol (VITAMIN D) 2000 units CAPS Take 2,000 Units by mouth daily.    Yes [provider]  diclofenac (VOLTAREN) 75 MG EC tablet Take 75 mg by mouth 2 (two) times daily. 10/17/19  Yes [provider]  Dietary Management Product (RHEUMATE) CAPS Take 1 capsule by mouth daily.    Yes [provider]  DIPROLENE 0.05 % ointment Apply 1 application topically as needed (skin irritation).  04/08/15  Yes [provider]  esomeprazole (NEXIUM) 10 MG packet Take 10 mg by mouth daily before breakfast.   Yes [provider]  ferrous sulfate 325 (65 FE) MG tablet Take 325 mg by mouth daily with breakfast.   Yes [provider]  fluticasone (FLONASE) 50 MCG/ACT nasal spray Place 2 sprays into both nostrils daily. 04/29/16  Yes Jeffery, Domingo Mend, PA  hydroxychloroquine (PLAQUENIL) 200 MG tablet Take 400 mg by mouth daily.  06/17/14  Yes [provider]  lidocaine-prilocaine (EMLA) cream Apply to affected area once Patient taking differently: Apply 1 application topically once.  10/18/19  Yes Truitt Merle, MD  losartan-hydrochlorothiazide (HYZAAR) 50-12.5 MG tablet Take 1 tablet by mouth daily.    Yes [provider]  methotrexate 50 MG/2ML injection Inject 25 mg into the skin once a week. 17mL = 25mg  06/17/14  Yes [provider]  metoprolol succinate (TOPROL-XL) 50 MG 24 hr tablet Take 50 mg by mouth daily.    Yes [provider]  mirtazapine (REMERON) 15 MG tablet Take 1 tablet (15  mg total) by mouth at bedtime. 10/25/19  Yes Alla Feeling, NP  topiramate (TOPAMAX) 50 MG tablet Take 75mg  to 100mg  (1.5 - 2 tablets) daily at bedtime for headaches Patient taking differently: Take 50-100 mg by mouth daily.  07/24/19  Yes Lomax, Amy, NP  ALPRAZolam (XANAX) 0.25 MG tablet Take 1 tablet (0.25 mg total) by mouth at bedtime as needed for anxiety. 10/18/19   Truitt Merle, MD  ondansetron (ZOFRAN) 8 MG tablet Take 1 tablet (8 mg total) by mouth 2 (two) times daily as needed. Start on day 3 after chemotherapy. 10/18/19   Truitt Merle, MD  oxyCODONE (OXY IR/ROXICODONE) 5 MG immediate release tablet Take 1 tablet (5 mg total) by mouth every 6 (six) hours as needed for severe pain. 10/11/19   [name], MD  potassium chloride SA (KLOR-CON) 20 MEQ tablet Take 1 tablet (20 mEq total) by mouth 2 (two) times daily. 10/24/19   Alla Feeling, NP  prochlorperazine (COMPAZINE) 10 MG tablet Take 1 tablet (10 mg total) by mouth every 6 (six) hours as needed (Nausea or vomiting). Patient taking differently: Take 10 mg by mouth every 6 (six) hours as needed for nausea or vomiting.  10/18/19   Truitt Merle, MD  rizatriptan (MAXALT-MLT) 10 MG disintegrating tablet Take 1 tablet (10 mg total) by mouth as needed for migraine. May repeat in 2 hours if needed 04/19/19   Melvenia Beam, MD    Allergies    Cinnamon, Peanut-containing drug products, Prednisone, Corn oil, Corn-containing products, and Other  Review of Systems   Review of Systems  All other systems reviewed and are negative.   Physical Exam Updated Vital Signs BP (!) 142/75   Pulse 83   Temp 98.4 F (36.9 C) (Oral)   Resp 15   Ht 5\' 7"  (1.702 m)   Wt 84.8 kg   SpO2 99%   BMI 29.29 kg/m   Physical Exam Vitals and nursing note reviewed.  Constitutional:      Appearance: She is well-developed.  HENT:     Head: Normocephalic and atraumatic.  Eyes:     Conjunctiva/sclera: Conjunctivae normal.  Cardiovascular:     Rate and Rhythm:  Normal rate and regular rhythm.  Pulmonary:     Effort: Pulmonary effort is normal.     Breath sounds: Normal breath sounds.  Abdominal:     General: Bowel sounds are normal.     Palpations: Abdomen is soft.     Comments: Minimal epigastric tenderness.  Musculoskeletal:        General: Normal range of motion.     Cervical back: Neck supple.  Skin:    General: Skin is warm and dry.  Neurological:     General: No focal deficit present.     Mental Status: She is alert and oriented to person, place, and time.  Psychiatric:        Behavior: Behavior normal.     ED Results / Procedures / Treatments   Labs (all labs ordered are listed, but only abnormal results are displayed) Labs Reviewed  CBC WITH DIFFERENTIAL/PLATELET - Abnormal; Notable for the following components:      Result Value   WBC 26.2 (*)    RBC 3.30 (*)    Hemoglobin 9.9 (*)    HCT 28.6 (*)    Neutro Abs 18.1 (*)    Abs Immature Granulocytes 6.46 (*)    All other components within normal limits  COMPREHENSIVE METABOLIC PANEL - Abnormal; Notable for the following components:   Potassium 2.8 (*)    Chloride 97 (*)    Glucose, Bld 102 (*)    AST 47 (*)    Alkaline Phosphatase 136 (*)    All other components within normal limits  CULTURE, BLOOD (ROUTINE X 2)  CULTURE, BLOOD (ROUTINE X 2)  LIPASE, BLOOD  URINALYSIS, ROUTINE W REFLEX MICROSCOPIC    EKG None  Radiology DG Chest 2 View  Result Date: 10/28/2019 CLINICAL DATA:  Elevated white count EXAM: CHEST - 2 VIEW COMPARISON:  None. FINDINGS: The heart size and mediastinal contours are within normal limits. Again noted is  streaky airspace opacity at the left lung base. A left-sided MediPort catheter seen with the tip at the superior cavoatrial junction. The visualized skeletal structures are unremarkable. IMPRESSION: Unchanged since October 18, 2019, however new since October 11, 2019 streaky airspace opacity at the left lung base which could be due to  subsegmental atelectasis and/or infectious etiology. Electronically Signed   By: Prudencio Pair M.D.   On: 10/28/2019 22:03    Procedures Procedures (including critical care time)  Medications Ordered in ED Medications  potassium chloride 10 mEq in 100 mL IVPB (10 mEq Intravenous New Bag/Given 10/28/19 2244)  sodium chloride 0.9 % bolus 1,000 mL (0 mLs Intravenous Stopped 10/28/19 2214)  ondansetron (ZOFRAN) injection 4 mg (4 mg Intravenous Given 10/28/19 2113)  sodium chloride 0.9 % bolus 1,000 mL (1,000 mLs Intravenous New Bag/Given 10/28/19 2214)    ED Course  I have reviewed the triage vital signs and the nursing notes.  Pertinent labs & imaging results that were available during my care of the patient were reviewed by me and considered in my medical decision making (see chart for details).    MDM Rules/Calculators/A&P                          Suspect chemo induced anorexia.  Basic labs, IV fluids, IV Zofran.  Patient rechecked several times.  She appears nontoxic.  Patient given infusion of white blood cells on Friday which explains her leukocytosis.  Encouraged to take her nausea medication. Final Clinical Impression(s) / ED Diagnoses Final diagnoses:  Malignant neoplasm of pancreas, unspecified location of malignancy ([place])  Weakness  Anorexia  Leukocytosis, unspecified type  Hypokalemia    Rx / DC Orders ED Discharge Orders    None       Nat Christen, MD 10/28/19 2046    Nat Christen, MD 10/28/19 7847    Nat Christen, MD 10/28/19 2322

## 2019-10-28 NOTE — Discharge Instructions (Addendum)
Your white blood cell count was elevated which is likely secondary to the infusion you received on Friday.  Take your nausea medication as prescribed. Follow up with your oncologist.  There is evidence of a urinary tract infection in your urinalysis.  You have been given ceftriaxone in the ER and will be discharged with Keflex for this.

## 2019-10-29 ENCOUNTER — Other Ambulatory Visit: Payer: Self-pay | Admitting: Radiology

## 2019-10-29 ENCOUNTER — Encounter: Payer: Self-pay | Admitting: Nurse Practitioner

## 2019-10-29 ENCOUNTER — Inpatient Hospital Stay (HOSPITAL_BASED_OUTPATIENT_CLINIC_OR_DEPARTMENT_OTHER): Payer: BC Managed Care – PPO | Admitting: Nurse Practitioner

## 2019-10-29 ENCOUNTER — Other Ambulatory Visit: Payer: Self-pay | Admitting: Student

## 2019-10-29 DIAGNOSIS — C250 Malignant neoplasm of head of pancreas: Secondary | ICD-10-CM

## 2019-10-29 MED ORDER — HEPARIN SOD (PORK) LOCK FLUSH 100 UNIT/ML IV SOLN
500.0000 [IU] | Freq: Once | INTRAVENOUS | Status: AC
  Administered 2019-10-29: 500 [IU]
  Filled 2019-10-29: qty 5

## 2019-10-29 MED ORDER — SODIUM CHLORIDE 0.9 % IV SOLN
1.0000 g | Freq: Once | INTRAVENOUS | Status: AC
  Administered 2019-10-29: 1 g via INTRAVENOUS
  Filled 2019-10-29: qty 10

## 2019-10-29 MED ORDER — DEXAMETHASONE 4 MG PO TABS: 4.0000 mg | ORAL_TABLET | Freq: Every day | ORAL | 0 refills | Status: DC

## 2019-10-29 MED ORDER — CEPHALEXIN 500 MG PO CAPS: 500.0000 mg | ORAL_CAPSULE | Freq: Three times a day (TID) | ORAL | 0 refills | Status: DC

## 2019-10-29 NOTE — Progress Notes (Signed)
Patient calls to make [place] aware that she had to go to the ED last night, received IVF, potassium IV and was noted to have a UTI which she was prescribed Keflex.  She states she is just really tired today.  Has a phone visit with Cira Rue NP this afternoon.  I have Cira Rue NP aware of the above.

## 2019-10-29 NOTE — ED Notes (Signed)
Provider at bedside

## 2019-10-29 NOTE — ED Notes (Signed)
Pts request to speak with provider. Pt voices concern that she notices the potassium is "dripping more than one drop at a time and it should be dripping slower". This writer explained to the patient that the potassium is programmed and the drop rate is correct. Pt requests to speak with provider still. Writer notified provider of pts wish to speak with him for confirmation.

## 2019-10-29 NOTE — ED Provider Notes (Signed)
Care assumed from Dr. Lacinda Axon at shift change.  Patient with history of pancreatic cancer presenting with nausea and weakness several days following her first dose of chemotherapy.  Patient's work-up thus far reveals a white count of 21,000 and evidence for urinary tract infection.  Patient appears clinically well and nontoxic.  She has no urinary symptoms and I suspect her leukocytosis is related to her Neupogen injection she received with her chemotherapy.  Patient was given IV Rocephin and will be discharged with Keflex.  Patient also hypokalemic with potassium of 2.8.  She was given IV potassium.  She did express some concern with the rate that the potassium was running.  I have reviewed the rate of infusion with the nurse and verify that it was going at 100 cc/h.  Reassurance offered that her potassium was being administered properly.   Veryl Speak, MD 10/29/19 [id number]

## 2019-10-29 NOTE — Progress Notes (Signed)
[place]   Telephone:(336) [phone] Fax:([phone]) [phone]   Clinic Follow up Note   Patient Care Team: Josetta Huddle, MD as PCP - General (Internal Medicine) Jonnie Finner, RN as Oncology Nurse Navigator Truitt Merle, MD as Consulting Physician (Oncology) Arta Silence, MD as Consulting Physician (Gastroenterology) [name], MD as Consulting Physician (General Surgery) 10/29/2019  I connected with [name] on 10/29/19 at  3:15 PM EDT by telephone visit and verified that I am speaking with the correct person using two identifiers.   I discussed the limitations, risks, security and privacy concerns of performing an evaluation and management service by telemedicine and the availability of in-person appointments. I also discussed with the patient that there may be a patient responsible charge related to this service. The patient expressed understanding and agreed to proceed.   Other persons participating in the visit and their role in the encounter: None  Patient's location: Home Provider's location: [facility] office   Chief Complaint: chemo toxicity check    SUMMARY OF ONCOLOGIC HISTORY: Oncology History Overview Note  Cancer Staging Pancreatic cancer [facility]) Staging form: Exocrine Pancreas, AJCC 8th Edition - Clinical stage from 10/18/2019: Stage IV (cT3, cN1, cM1) - Signed by Truitt Merle, MD on 10/18/2019    Pancreatic cancer ([place])  09/19/2019 Imaging   CT AP w contrast 09/19/19  IMPRESSION: 1. Findings are highly concerning for probable primary pancreatic adenocarcinoma in the anterior aspect of the pancreatic head. Several prominent borderline enlarged lymph nodes are noted in the hepatoduodenal nodal station, and there are multiple indeterminate liver lesions which are highly concerning for probable hepatic metastases. Further evaluation with nonemergent abdominal MRI with and without IV gadolinium with MRCP is recommended in the near future to better  evaluate these findings.   09/25/2019 Procedure   Upper EUS by Dr Paulita Fujita  IMPRESSION -There was no evidence of significant pathology in the left lobe of the liver.  -A few lymph nodes were visualized and measures in the peripancreatic region and porta hepata region.  -Hyperchoic material consistent with sludge was visualized endosonographically in the gallbladder.  -There was no sign of significant pathology in the common bile duct.  -A mass was identified in the pancreatic head. If biopsy results show adenocarcinoma, it would be staged T3N1Mx by endosonographic criteria. Fine needle aspiration performed.    09/25/2019 Initial Biopsy   FINAL MICROSCOPIC DIAGNOSIS: Fine needle aspirate, Pancreas;  MALIGNANT CELLS PRESENT CONSISTENT WITH ADENOCARCINOMA.    10/02/2019 Initial Diagnosis   Pancreatic cancer ([place])   10/11/2019 Procedure   PAC placement y Dr Barry Dienes    10/15/2019 Imaging   CT Chest  IMPRESSION: 1. Small anterior left pneumothorax with dependent atelectasis in the left lower lobe. 2. Increased number of bilateral axillary and subpectoral lymph nodes with mild lymphadenopathy in the left axilla. While this would be an atypical presentation for metastatic pancreatic cancer, this possibility is not excluded 3. Main duct dilatation in the pancreas, better assessed on abdomen CT 09/19/2019.   10/18/2019 Cancer Staging   Staging form: Exocrine Pancreas, AJCC 8th Edition - Clinical stage from 10/18/2019: Stage IV (cT3, cN1, cM1) - Signed by Truitt Merle, MD on 10/18/2019   10/24/2019 -  Chemotherapy   The patient had dexamethasone (DECADRON) 4 MG tablet, 8 mg, Oral, Daily, 1 of 1 cycle, Start date: --, End date: -- palonosetron (ALOXI) injection 0.25 mg, 0.25 mg, Intravenous,  Once, 1 of 4 cycles Administration: 0.25 mg (10/24/2019) pegfilgrastim-cbqv (UDENYCA) injection 6 mg, 6 mg,  Subcutaneous, Once, 1 of 4 cycles irinotecan (CAMPTOSAR) 300 mg in sodium chloride 0.9 % 500 mL chemo  infusion, 150 mg/m2 = 300 mg, Intravenous,  Once, 1 of 4 cycles Administration: 300 mg (10/24/2019) oxaliplatin (ELOXATIN) 170 mg in dextrose 5 % 500 mL chemo infusion, 85 mg/m2 = 170 mg, Intravenous,  Once, 1 of 4 cycles Administration: 170 mg (10/24/2019) fosaprepitant (EMEND) 150 mg in sodium chloride 0.9 % 145 mL IVPB, 150 mg, Intravenous,  Once, 1 of 4 cycles Administration: 150 mg (10/24/2019) fluorouracil (ADRUCIL) 4,800 mg in sodium chloride 0.9 % 54 mL chemo infusion, 2,400 mg/m2 = 4,800 mg, Intravenous, 1 Day/Dose, 1 of 4 cycles Administration: 4,800 mg (10/24/2019) leucovorin 804 mg in sodium chloride 0.9 % 250 mL infusion, 400 mg/m2 = 804 mg, Intravenous,  Once, 1 of 4 cycles Administration: 804 mg (10/24/2019)  for chemotherapy treatment.      CURRENT THERAPY: First line FOLFIRINOX starting 10/24/19  INTERVAL HISTORY: Ms. Mcgruder presents for phone toxicity check. She received first dose FOLFIRINOX on 10/24/19 and Udenyca on 10/1. She called and spoke to [place] on 10/1 to clarify anti-emetics. On 10/3 she presented to ED with weakness and unable to keep fluids down.  She was found to have a white blood cell count of 26.2, Hg 9.9, K 2.8.  Blood cultures NGTD, UA showed leukocytes, WBC and bacteria.  She was supported with IV fluids, antiemetics, and K run as well as given a dose of IV Rocephin and discharged on Keflex for UTI but has not picked it up yet.  Today "she feels much better than yesterday after receiving fluids.  She is trying to drink more.  She still has cold sensitivity, could drink better if she could have cold liquids.  Denies mucositis.  Her breakfast "stayed down" but then she threw up an apple later on.  He has not taken antiemetics today and has not started mirtazapine yet.  Bowels are moving with mild constipation.  Denies any fever, chills, cough, chest pain, dyspnea.   MEDICAL HISTORY:  Past Medical History:  Diagnosis Date  . Allergies    peanuts,  corn, beans, red grapefruit, naval oranges  . Asthma    allergy shots and medication  . Cancer [facility])    pancreatic  . DDD (degenerative disc disease), lumbar   . Eustachian tube dysfunction 12/2012   rhinitis, vertigo- Dr. Wilburn Cornelia, ENT   . Fibroids   . Heart murmur    Echo 1/18: EF 55-60, no RWMA, normal diastolic function, trivial AI, PASP 32  . History of cardiac catheterization    LHC 3/18: normal coronary arteries.   . History of nuclear stress test    ETT-Myoview 2/18: EF 62, + ECG response; apical and distal septal ischemia; intermediate risk.  Marland Kitchen Hypertension   . Iron deficiency anemia   . Lupus [facility]) 2011   Dr. Trudie Reed  . Migraine headache    trial of generic maxalt 10 mg, January 2021  . Obesity   . Seasonal allergies   . Seizure in childhood [facility])    as a child no treatment none x 30 years    SURGICAL HISTORY: Past Surgical History:  Procedure Laterality Date  . ABDOMINAL HYSTERECTOMY    . BACK SURGERY  2009  . COLONOSCOPY WITH PROPOFOL N/A 04/11/2012   Procedure: COLONOSCOPY WITH PROPOFOL;  Surgeon: Garlan Fair, MD;  Location: [facility] ENDOSCOPY;  Service: Endoscopy;  Laterality: N/A;  . HERNIA REPAIR     as child  unbilical  . Q7-H partial discectomy and laminectomy     Dr. Christella Noa  . LEFT HEART CATH AND CORONARY ANGIOGRAPHY N/A 04/09/2016   Procedure: Left Heart Cath and Coronary Angiography;  Surgeon: Nelva Bush, MD;  Location: [place] CV LAB;  Service: Cardiovascular;  Laterality: N/A;  . MOUTH SURGERY    . PORTACATH PLACEMENT N/A 10/11/2019   Procedure: INSERTION PORT-A-CATH LEFT SUBCLAVIAN;  Surgeon: [name], MD;  Location: [place];  Service: General;  Laterality: N/A;    I have reviewed the social history and family history with the patient and they are unchanged from previous note.  ALLERGIES:  is allergic to cinnamon, peanut-containing drug products, prednisone, corn oil, corn-containing products, and  other.  MEDICATIONS:  Current Outpatient Medications  Medication Sig Dispense Refill  . acetaminophen (TYLENOL) 500 MG tablet Take 1,000 mg by mouth every 6 (six) hours as needed for moderate pain.    Marland Kitchen albuterol (PROVENTIL HFA;VENTOLIN HFA) 108 (90 Base) MCG/ACT inhaler Inhale 2 puffs into the lungs every 6 (six) hours as needed for wheezing or shortness of breath. 18 g 0  . ALPRAZolam (XANAX) 0.25 MG tablet Take 1 tablet (0.25 mg total) by mouth at bedtime as needed for anxiety. 20 tablet 0  . aspirin 81 MG tablet Take 81 mg by mouth daily.    . Bepotastine Besilate (BEPREVE) 1.5 % SOLN Place 1 drop into both eyes 2 (two) times daily as needed (DRY EYES).    . cephALEXin (KEFLEX) 500 MG capsule Take 1 capsule (500 mg total) by mouth 3 (three) times daily. 15 capsule 0  . cetirizine (ZYRTEC) 10 MG chewable tablet Chew 10 mg by mouth daily.    . Cholecalciferol (VITAMIN D) 2000 units CAPS Take 2,000 Units by mouth daily.     . diclofenac (VOLTAREN) 75 MG EC tablet Take 75 mg by mouth 2 (two) times daily.    . Dietary Management Product (RHEUMATE) CAPS Take 1 capsule by mouth daily.     Marland Kitchen DIPROLENE 0.05 % ointment Apply 1 application topically as needed (skin irritation).     Marland Kitchen esomeprazole (NEXIUM) 10 MG packet Take 10 mg by mouth daily before breakfast.    . ferrous sulfate 325 (65 FE) MG tablet Take 325 mg by mouth daily with breakfast.    . fluticasone (FLONASE) 50 MCG/ACT nasal spray Place 2 sprays into both nostrils daily. 16 g 12  . hydroxychloroquine (PLAQUENIL) 200 MG tablet Take 400 mg by mouth daily.   5  . lidocaine-prilocaine (EMLA) cream Apply to affected area once (Patient taking differently: Apply 1 application topically once. ) 30 g 3  . losartan-hydrochlorothiazide (HYZAAR) 50-12.5 MG tablet Take 1 tablet by mouth daily.     . methotrexate 50 MG/2ML injection Inject 25 mg into the skin once a week. 33mL = 25mg   3  . metoprolol succinate (TOPROL-XL) 50 MG 24 hr tablet Take 50 mg  by mouth daily.     . mirtazapine (REMERON) 15 MG tablet Take 1 tablet (15 mg total) by mouth at bedtime. 30 tablet 1  . ondansetron (ZOFRAN) 8 MG tablet Take 1 tablet (8 mg total) by mouth 2 (two) times daily as needed. Start on day 3 after chemotherapy. 30 tablet 1  . oxyCODONE (OXY IR/ROXICODONE) 5 MG immediate release tablet Take 1 tablet (5 mg total) by mouth every 6 (six) hours as needed for severe pain. 8 tablet 0  . potassium chloride SA (KLOR-CON) 20 MEQ tablet Take 1  tablet (20 mEq total) by mouth 2 (two) times daily. 60 tablet 1  . prochlorperazine (COMPAZINE) 10 MG tablet Take 1 tablet (10 mg total) by mouth every 6 (six) hours as needed (Nausea or vomiting). (Patient taking differently: Take 10 mg by mouth every 6 (six) hours as needed for nausea or vomiting. ) 30 tablet 1  . rizatriptan (MAXALT-MLT) 10 MG disintegrating tablet Take 1 tablet (10 mg total) by mouth as needed for migraine. May repeat in 2 hours if needed 9 tablet 11  . topiramate (TOPAMAX) 50 MG tablet Take 75mg  to 100mg  (1.5 - 2 tablets) daily at bedtime for headaches (Patient taking differently: Take 50-100 mg by mouth daily. ) 180 tablet 3   No current facility-administered medications for this visit.    PHYSICAL EXAMINATION: ECOG PERFORMANCE STATUS: 2 - Symptomatic, <50% confined to bed  There were no vitals filed for this visit. There were no vitals filed for this visit.  Patient appears awake and alert over the phone, speech is clear and intact.  No cough or conversational dyspnea  LABORATORY DATA:  No labs for today's visit    RADIOGRAPHIC STUDIES: I have personally reviewed the radiological images as listed and agreed with the findings in the report. DG Chest 2 View  Result Date: 10/28/2019 CLINICAL DATA:  Elevated white count EXAM: CHEST - 2 VIEW COMPARISON:  None. FINDINGS: The heart size and mediastinal contours are within normal limits. Again noted is streaky airspace opacity at the left lung base.  A left-sided MediPort catheter seen with the tip at the superior cavoatrial junction. The visualized skeletal structures are unremarkable. IMPRESSION: Unchanged since October 18, 2019, however new since October 11, 2019 streaky airspace opacity at the left lung base which could be due to subsegmental atelectasis and/or infectious etiology. Electronically Signed   By: Prudencio Pair M.D.   On: 10/28/2019 22:03     ASSESSMENT & PLAN: Nanette A Gravesis a 59 y.o.femalewith    1. 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 Her EUS biopsy with Dr Paulita Fujita on 09/25/19 shows pancreatic adenocarcinoma.  -Her CT Scan also showsseveral subcentimeterliver lesions suspicious for metastasis,also technically indeterminate. -CT chest from 10/15/19 and MRI abdomen from 9/21/21showed multiple left axillary and subpectoral adenopathy, likely related to her recent Covid injection in left upper arm.  -Her abdominal MRI showed multiple(>10)small liver lesions, most are consistent with metastatic lesions.Consensus in GI tumor conference is to attempt IR liver biopsy which is scheduled on 10/5, will request [facility]. -we reviewed her left mammogram/US from 9/28 which shows multiple left axillary LNs with cortical thickening up to 5-6 mm. Not palpable on my exam. Per review with Dr. Burr Medico and radiology, this is likely reactive to covid19 vaccine and the recommendation is short interval follow up.  -She underwent port placement, chemo class, and previously consented to FOLFIRINOX chemo with palliative intent.  -S/p cycle 1 FOLFIRINOX on 10/24/2019  2.  Nausea/vomiting, dehydration, weakness  -Following cycle 1 FOLFIRINOX -Required ED visit for IV fluids, potassium, antiemetics and treated for UTI -Improving, tolerating p.o. -Reviewed symptom management  3. Upper  abdominal cramps, Weight loss -Secondary to #1  -She has had intermittent upper abdominal cramping after certain meals. She also has had a gradual 40 pounds weight lossover 7 months (since 03/2019) -Her appetite is otherwise stable.Continue to f/u with dietician. -She has oxycodone for her Lupus but doesn't take it. Epigastric  pain mostly relieved with Nexium -we discussed appetite stimulants and nutrition supplements. She has food allergies and little variety in her diet.  -I recommend mirtazapine, also to help her sleep. She was prescribed sertraline for anxiety but has not started yet. She agrees to hold sertraline for now and try mirtazapine.   4. Comorbidities: Lupus, HTN, Migraines  -Symptomatic in her joints, Dx many years ago  -She is being followed by Rheumatologist Dr Wyline Copas and being treated with weekly Methotrexate injections, Plaquenil andDiclofenac. -For Migraines is well controlled on Topomax. She has been seen by Dr Jaynee Eagles.   5. Leftsmall pneumothorax -Secondary to port placement -repeat CXR9/23/21 shows enlarging but still small persistent pneumo, she is asymptomatic  -monitoring  -Repeat CXR in ED on 10/3 is stable  6. Anxiety, Social Support  -She has been very anxious with the fast pace workup and unexpected diagnosis. -She lives with her husband in [place]. She does not have children. She does have a brother who lives close by but no other near relatives. -She plans to apply for disability -We will refer her to social workerfor support  Disposition: Ms. Heather appears stable.  She is s/p cycle 1 day 6 FOLFIRINOX and G-CSF.  She did not tolerate well, with N/V, dehydration, and weakness.  She required IVF support and IV potassium in the ER and was treated for UTI.  She appears slightly improved today over the phone.  We discussed nutrition, hydration, and symptom management with aggressive use of antiemetics, she is concerned about using Zofran and Compazine  frequently due to side effects.  I recommend a trial of Decadron.  I will arrange IV fluids tomorrow after liver biopsy. She may need dose reduction and scheduled IV fluids with cycle 2.  Lab and follow-up on 10/14 prior to cycle 2.  All questions were answered. The patient knows to call the clinic with any problems, questions or concerns. No barriers to learning were detected. She knows to call back for in-person eval if symptoms worsen or fail to improve.      Alla Feeling, NP 10/29/19

## 2019-10-30 ENCOUNTER — Other Ambulatory Visit: Payer: Self-pay | Admitting: Nurse Practitioner

## 2019-10-30 ENCOUNTER — Other Ambulatory Visit: Payer: Self-pay

## 2019-10-30 ENCOUNTER — Ambulatory Visit: Payer: BC Managed Care – PPO

## 2019-10-30 ENCOUNTER — Encounter (HOSPITAL_COMMUNITY): Payer: Self-pay

## 2019-10-30 ENCOUNTER — Telehealth: Payer: Self-pay

## 2019-10-30 ENCOUNTER — Ambulatory Visit (HOSPITAL_COMMUNITY)
Admission: RE | Admit: 2019-10-30 | Discharge: 2019-10-30 | Disposition: A | Discharge status: Home / Self Care | Payer: BC Managed Care – PPO | Source: Ambulatory Visit | Attending: Hematology | Admitting: Hematology

## 2019-10-30 DIAGNOSIS — C259 Malignant neoplasm of pancreas, unspecified: Secondary | ICD-10-CM | POA: Diagnosis not present

## 2019-10-30 DIAGNOSIS — K769 Liver disease, unspecified: Secondary | ICD-10-CM | POA: Diagnosis not present

## 2019-10-30 DIAGNOSIS — K7689 Other specified diseases of liver: Secondary | ICD-10-CM | POA: Diagnosis not present

## 2019-10-30 DIAGNOSIS — Z79899 Other long term (current) drug therapy: Secondary | ICD-10-CM | POA: Insufficient documentation

## 2019-10-30 DIAGNOSIS — C250 Malignant neoplasm of head of pancreas: Secondary | ICD-10-CM

## 2019-10-30 DIAGNOSIS — I10 Essential (primary) hypertension: Secondary | ICD-10-CM | POA: Diagnosis not present

## 2019-10-30 DIAGNOSIS — Z7982 Long term (current) use of aspirin: Secondary | ICD-10-CM | POA: Diagnosis not present

## 2019-10-30 DIAGNOSIS — C787 Secondary malignant neoplasm of liver and intrahepatic bile duct: Secondary | ICD-10-CM | POA: Diagnosis not present

## 2019-10-30 DIAGNOSIS — C50919 Malignant neoplasm of unspecified site of unspecified female breast: Secondary | ICD-10-CM | POA: Diagnosis not present

## 2019-10-30 LAB — COMPREHENSIVE METABOLIC PANEL
ALT: 36 U/L (ref 0–44)
AST: 31 U/L (ref 15–41)
Albumin: 3.7 g/dL (ref 3.5–5.0)
Alkaline Phosphatase: 142 U/L — ABNORMAL HIGH (ref 38–126)
Anion gap: 13 (ref 5–15)
BUN: 9 mg/dL (ref 6–20)
CO2: 26 mmol/L (ref 22–32)
Calcium: 9.7 mg/dL (ref 8.9–10.3)
Chloride: 104 mmol/L (ref 98–111)
Creatinine, Ser: 0.71 mg/dL (ref 0.44–1.00)
GFR calc non Af Amer: >60 mL/min (ref >60)
Glucose, Bld: 101 mg/dL — ABNORMAL HIGH (ref 70–99)
Potassium: 3 mmol/L — ABNORMAL LOW (ref 3.5–5.1)
Sodium: 143 mmol/L (ref 135–145)
Total Bilirubin: 0.8 mg/dL (ref 0.3–1.2)
Total Protein: 7.6 g/dL (ref 6.5–8.1)

## 2019-10-30 LAB — APTT: aPTT: 26 seconds (ref 24–36)

## 2019-10-30 LAB — CBC
HCT: 27.8 % — ABNORMAL LOW (ref 36.0–46.0)
Hemoglobin: 9.5 g/dL — ABNORMAL LOW (ref 12.0–15.0)
MCH: 30.2 pg (ref 26.0–34.0)
MCHC: 34.2 g/dL (ref 30.0–36.0)
MCV: 88.3 fL (ref 80.0–100.0)
Platelets: 166 10*3/uL (ref 150–400)
RBC: 3.15 MIL/uL — ABNORMAL LOW (ref 3.87–5.11)
RDW: 12.5 % (ref 11.5–15.5)
WBC: 10.3 10*3/uL (ref 4.0–10.5)
nRBC: 0 % (ref 0.0–0.2)

## 2019-10-30 LAB — PROTIME-INR
INR: 1.2 (ref 0.8–1.2)
Prothrombin Time: 14.4 seconds (ref 11.4–15.2)

## 2019-10-30 MED ORDER — ACETAMINOPHEN 500 MG PO TABS
ORAL_TABLET | ORAL | Status: AC
  Administered 2019-10-30: 500 mg
  Filled 2019-10-30: qty 2

## 2019-10-30 MED ORDER — MIDAZOLAM HCL 2 MG/2ML IJ SOLN
INTRAMUSCULAR | Status: AC
  Filled 2019-10-30: qty 2

## 2019-10-30 MED ORDER — MIDAZOLAM HCL 2 MG/2ML IJ SOLN
INTRAMUSCULAR | Status: AC | PRN
  Administered 2019-10-30 (×4): 1 mg via INTRAVENOUS

## 2019-10-30 MED ORDER — LIDOCAINE HCL 1 % IJ SOLN
INTRAMUSCULAR | Status: AC
  Filled 2019-10-30: qty 20

## 2019-10-30 MED ORDER — SODIUM CHLORIDE 0.9 % IV SOLN: INTRAVENOUS | Status: DC

## 2019-10-30 MED ORDER — FENTANYL CITRATE (PF) 100 MCG/2ML IJ SOLN
INTRAMUSCULAR | Status: DC
Start: 2019-10-30 — End: 2019-10-31
  Filled 2019-10-30: qty 2

## 2019-10-30 MED ORDER — CEPHALEXIN 500 MG PO CAPS
500.0000 mg | ORAL_CAPSULE | Freq: Three times a day (TID) | ORAL | 0 refills | Status: DC
Start: 2019-10-30 — End: 2019-11-09

## 2019-10-30 MED ORDER — HEPARIN SOD (PORK) LOCK FLUSH 100 UNIT/ML IV SOLN
500.0000 [IU] | Freq: Once | INTRAVENOUS | Status: AC
  Administered 2019-10-30: 500 [IU] via INTRAVENOUS
  Filled 2019-10-30: qty 5

## 2019-10-30 MED ORDER — SODIUM CHLORIDE 0.9 % IV SOLN: Freq: Once | INTRAVENOUS | Status: DC

## 2019-10-30 MED ORDER — FENTANYL CITRATE (PF) 100 MCG/2ML IJ SOLN
INTRAMUSCULAR | Status: AC | PRN
  Administered 2019-10-30 (×2): 50 ug via INTRAVENOUS

## 2019-10-30 MED ORDER — LIDOCAINE HCL (PF) 1 % IJ SOLN
INTRAMUSCULAR | Status: AC | PRN
  Administered 2019-10-30: 10 mL via INTRADERMAL

## 2019-10-30 MED ORDER — GELATIN ABSORBABLE 12-7 MM EX MISC
CUTANEOUS | Status: AC
  Filled 2019-10-30: qty 1

## 2019-10-30 NOTE — Procedures (Signed)
Interventional Radiology Procedure Note  Procedure: US guided core biopsy liver lesion  Complications: None  Estimated Blood Loss: None  Recommendations: - Bedrest x 2 hrs - DC home   Signed,  Criselda Peaches, MD

## 2019-10-30 NOTE — Progress Notes (Signed)
Patient calls inquiring about whether she can eat this morning.  She is having biopsy done today.  I informed her she is to have nothing by mouth after 7:00 am.  She verbalized an understanding. She also states she went to get Keflex that hospital was supposed to send in and the pharmacy had no record of it.  I informed Cira Rue NP and she will send it into the pharmacy.

## 2019-10-30 NOTE — Discharge Instructions (Signed)
Please call Interventional Radiology clinic [phone] with any questions or concerns.  You may remove your dressing and shower tomorrow.  Liver Biopsy, Care After These instructions give you information on caring for yourself after your procedure. Your doctor may also give you more specific instructions. Call your doctor if you have any problems or questions after your procedure. What can I expect after the procedure? After the procedure, it is common to have:  Pain and soreness where the biopsy was done.  Bruising around the area where the biopsy was done.  Sleepiness and be tired for a few days. Follow these instructions at home: Medicines  Take over-the-counter and prescription medicines only as told by your doctor.  If you were prescribed an antibiotic medicine, take it as told by your doctor. Do not stop taking the antibiotic even if you start to feel better.  Do not take medicines such as aspirin and ibuprofen. These medicines can thin your blood. Do not take these medicines unless your doctor tells you to take them.  If you are taking prescription pain medicine, take actions to prevent or treat constipation. Your doctor may recommend that you: ? Drink enough fluid to keep your pee (urine) clear or pale yellow. ? Take over-the-counter or prescription medicines. ? Eat foods that are high in fiber, such as fresh fruits and vegetables, whole grains, and beans. ? Limit foods that are high in fat and processed sugars, such as fried and sweet foods. Caring for your cut  Follow instructions from your doctor about how to take care of your cuts from surgery (incisions). Make sure you: ? Wash your hands with soap and water before you change your bandage (dressing). If you cannot use soap and water, use hand sanitizer. ? Change your bandage as told by your doctor. ? Leave stitches (sutures), skin glue, or skin tape (adhesive) strips in place. They may need to stay in place for 2 weeks  or longer. If tape strips get loose and curl up, you may trim the loose edges. Do not remove tape strips completely unless your doctor says it is okay.  Check your cuts every day for signs of infection. Check for: ? Redness, swelling, or more pain. ? Fluid or blood. ? Pus or a bad smell. ? Warmth.  Do not take baths, swim, or use a hot tub until your doctor says it is okay to do so. Activity   Rest at home for 1-2 days or as told by your doctor. ? Avoid sitting for a long time without moving. Get up to take short walks every 1-2 hours.  Return to your normal activities as told by your doctor. Ask what activities are safe for you.  Do not do these things in the first 24 hours: ? Drive. ? Use machinery. ? Take a bath or shower.  Do not lift more than 10 pounds (4.5 kg) or play contact sports for the first 2 weeks. General instructions   Do not drink alcohol in the first week after the procedure.  Have someone stay with you for at least 24 hours after the procedure.  Get your test results. Ask your doctor or the department that is doing the test: ? When will my results be ready? ? How will I get my results? ? What are my treatment options? ? What other tests do I need? ? What are my next steps?  Keep all follow-up visits as told by your doctor. This is important. Contact a doctor if:    A cut bleeds and leaves more than just a small spot of blood.  A cut is red, puffs up (swells), or hurts more than before.  Fluid or something else comes from a cut.  A cut smells bad.  You have a fever or chills. Get help right away if:  You have swelling, bloating, or pain in your belly (abdomen).  You get dizzy or faint.  You have a rash.  You feel sick to your stomach (nauseous) or throw up (vomit).  You have trouble breathing, feel short of breath, or feel faint.  Your chest hurts.  You have problems talking or seeing.  You have trouble with your balance or moving your  arms or legs. Summary  After the procedure, it is common to have pain, soreness, bruising, and tiredness.  Your doctor will tell you how to take care of yourself at home. Change your bandage, take your medicines, and limit your activities as told by your doctor.  Call your doctor if you have symptoms of infection. Get help right away if your belly swells, your cut bleeds a lot, or you have trouble talking or breathing. This information is not intended to replace advice given to you by your health care provider. Make sure you discuss any questions you have with your health care provider. Document Revised: 01/21/2017 Document Reviewed: 01/21/2017 Elsevier Patient Education  2020 Elsevier Inc.   Moderate Conscious Sedation, Adult, Care After These instructions provide you with information about caring for yourself after your procedure. Your health care provider may also give you more specific instructions. Your treatment has been planned according to current medical practices, but problems sometimes occur. Call your health care provider if you have any problems or questions after your procedure. What can I expect after the procedure? After your procedure, it is common:  To feel sleepy for several hours.  To feel clumsy and have poor balance for several hours.  To have poor judgment for several hours.  To vomit if you eat too soon. Follow these instructions at home: For at least 24 hours after the procedure:   Do not: ? Participate in activities where you could fall or become injured. ? Drive. ? Use heavy machinery. ? Drink alcohol. ? Take sleeping pills or medicines that cause drowsiness. ? Make important decisions or sign legal documents. ? Take care of children on your own.  Rest. Eating and drinking  Follow the diet recommended by your health care provider.  If you vomit: ? Drink water, juice, or soup when you can drink without vomiting. ? Make sure you have little or no  nausea before eating solid foods. General instructions  Have a responsible adult stay with you until you are awake and alert.  Take over-the-counter and prescription medicines only as told by your health care provider.  If you smoke, do not smoke without supervision.  Keep all follow-up visits as told by your health care provider. This is important. Contact a health care provider if:  You keep feeling nauseous or you keep vomiting.  You feel light-headed.  You develop a rash.  You have a fever. Get help right away if:  You have trouble breathing. This information is not intended to replace advice given to you by your health care provider. Make sure you discuss any questions you have with your health care provider. Document Revised: 12/24/2016 Document Reviewed: 05/03/2015 Elsevier Patient Education  2020 Elsevier Inc.  

## 2019-10-30 NOTE — Progress Notes (Incomplete)
[facility] infusion room to inquire about the IVF infusion that the patient has scheduled today at 1430.  They state she is to get NS 1 liter over 2 hours.  Informed them that Hershal Coria. okayed giving the infusion while the patient is in short stay today for her.  They voiced understanding and state they will take the patient off of their schedule for today.

## 2019-10-30 NOTE — Telephone Encounter (Signed)
Received a call from [facility] in short stay and she stated that they will administer the IVF in short stay since patient is there for liver biopsy. Clarified order for NS 560mL over 2 hours.

## 2019-10-30 NOTE — H&P (Signed)
Chief Complaint: Liver masses. Request is for liver biopsy  Referring Physician(s): Feng,Yan  Supervising Physician: Jacqulynn Cadet  Patient Status: [facility] - Out-pt  History of Present Illness: Cynthia Frye is a 59 y.o. female 59 y.o. female outpatient. History of Lupus, HTN, iron deficiency anemia, DDD s/p L5 discectomy. Recently diagnosed with pancreatic adenocarcinoma. MR from 9.29.21 shows numerous liver masses. Team is requesting a liver biopsy for further determination of possible metastates.   Patient reporting an increase in generalized weakness X 2 days. Patient contacted her oncologist who suggested IV fluids post IR procedure. Patient will be given fluids while in IR.  Past Medical History:  Diagnosis Date  . Allergies    peanuts, corn, beans, red grapefruit, naval oranges  . Asthma    allergy shots and medication  . Cancer [facility])    pancreatic  . DDD (degenerative disc disease), lumbar   . Eustachian tube dysfunction 12/2012   rhinitis, vertigo- Dr. Wilburn Cornelia, ENT   . Fibroids   . Heart murmur    Echo 1/18: EF 55-60, no RWMA, normal diastolic function, trivial AI, PASP 32  . History of cardiac catheterization    LHC 3/18: normal coronary arteries.   . History of nuclear stress test    ETT-Myoview 2/18: EF 62, + ECG response; apical and distal septal ischemia; intermediate risk.  Marland Kitchen Hypertension   . Iron deficiency anemia   . Lupus [facility]) 2011   Dr. Trudie Reed  . Migraine headache    trial of generic maxalt 10 mg, January 2021  . Obesity   . Seasonal allergies   . Seizure in childhood [facility])    as a child no treatment none x 30 years    Past Surgical History:  Procedure Laterality Date  . ABDOMINAL HYSTERECTOMY    . BACK SURGERY  2009  . COLONOSCOPY WITH PROPOFOL N/A 04/11/2012   Procedure: COLONOSCOPY WITH PROPOFOL;  Surgeon: Garlan Fair, MD;  Location: [facility] ENDOSCOPY;  Service: Endoscopy;  Laterality: N/A;  . HERNIA REPAIR     as child unbilical  .  Y7-X partial discectomy and laminectomy     Dr. Christella Noa  . LEFT HEART CATH AND CORONARY ANGIOGRAPHY N/A 04/09/2016   Procedure: Left Heart Cath and Coronary Angiography;  Surgeon: Nelva Bush, MD;  Location: [place] CV LAB;  Service: Cardiovascular;  Laterality: N/A;  . MOUTH SURGERY    . PORTACATH PLACEMENT N/A 10/11/2019   Procedure: INSERTION PORT-A-CATH LEFT SUBCLAVIAN;  Surgeon: [name], MD;  Location: [place];  Service: General;  Laterality: N/A;    Allergies: Cinnamon, Peanut-containing drug products, Prednisone, Corn oil, Corn-containing products, and Other  Medications: Prior to Admission medications   Medication Sig Start Date End Date Taking? Authorizing Provider  aspirin 81 MG tablet Take 81 mg by mouth daily.   Yes [provider]  Bepotastine Besilate (BEPREVE) 1.5 % SOLN Place 1 drop into both eyes 2 (two) times daily as needed (DRY EYES).   Yes [provider]  cephALEXin (KEFLEX) 500 MG capsule Take 1 capsule (500 mg total) by mouth 3 (three) times daily. 10/29/19  Yes Delo, Nathaneil Canary, MD  cetirizine (ZYRTEC) 10 MG chewable tablet Chew 10 mg by mouth daily.   Yes [provider]  Cholecalciferol (VITAMIN D) 2000 units CAPS Take 2,000 Units by mouth daily.    Yes [provider]  diclofenac (VOLTAREN) 75 MG EC tablet Take 75 mg by mouth 2 (two) times daily. 10/17/19  Yes [provider]  Dietary Management Product (RHEUMATE) CAPS Take 1 capsule by mouth daily.    Yes [provider]  esomeprazole (NEXIUM) 10 MG packet Take 10 mg by mouth daily before breakfast.   Yes [provider]  ferrous sulfate 325 (65 FE) MG tablet Take 325 mg by mouth daily with breakfast.   Yes [provider]  fluticasone (FLONASE) 50 MCG/ACT nasal spray Place 2 sprays into both nostrils daily. 04/29/16  Yes Jeffery, Domingo Mend, PA  hydroxychloroquine (PLAQUENIL) 200 MG tablet Take 400 mg by mouth daily.   06/17/14  Yes [provider]  losartan-hydrochlorothiazide (HYZAAR) 50-12.5 MG tablet Take 1 tablet by mouth daily.    Yes [provider]  methotrexate 50 MG/2ML injection Inject 25 mg into the skin once a week. 24mL = 25mg  06/17/14  Yes [provider]  metoprolol succinate (TOPROL-XL) 50 MG 24 hr tablet Take 50 mg by mouth daily.    Yes [provider]  mirtazapine (REMERON) 15 MG tablet Take 1 tablet (15 mg total) by mouth at bedtime. 10/25/19  Yes Alla Feeling, NP  topiramate (TOPAMAX) 50 MG tablet Take 75mg  to 100mg  (1.5 - 2 tablets) daily at bedtime for headaches Patient taking differently: Take 50-100 mg by mouth daily.  07/24/19  Yes Lomax, Amy, NP  acetaminophen (TYLENOL) 500 MG tablet Take 1,000 mg by mouth every 6 (six) hours as needed for moderate pain.    [provider]  albuterol (PROVENTIL HFA;VENTOLIN HFA) 108 (90 Base) MCG/ACT inhaler Inhale 2 puffs into the lungs every 6 (six) hours as needed for wheezing or shortness of breath. 04/29/16   Harrison Mons, PA  ALPRAZolam (XANAX) 0.25 MG tablet Take 1 tablet (0.25 mg total) by mouth at bedtime as needed for anxiety. 10/18/19   Truitt Merle, MD  dexamethasone (DECADRON) 4 MG tablet Take 1 tablet (4 mg total) by mouth daily. Take 1 tablet once daily for 3 to 5 days after chemo 10/29/19   Alla Feeling, NP  DIPROLENE 0.05 % ointment Apply 1 application topically as needed (skin irritation).  04/08/15   [provider]  lidocaine-prilocaine (EMLA) cream Apply to affected area once Patient taking differently: Apply 1 application topically once.  10/18/19   Truitt Merle, MD  ondansetron (ZOFRAN) 8 MG tablet Take 1 tablet (8 mg total) by mouth 2 (two) times daily as needed. Start on day 3 after chemotherapy. 10/18/19   Truitt Merle, MD  oxyCODONE (OXY IR/ROXICODONE) 5 MG immediate release tablet Take 1 tablet (5 mg total) by mouth every 6 (six) hours as needed for severe pain. 10/11/19   [name],  MD  potassium chloride SA (KLOR-CON) 20 MEQ tablet Take 1 tablet (20 mEq total) by mouth 2 (two) times daily. 10/24/19   Alla Feeling, NP  prochlorperazine (COMPAZINE) 10 MG tablet Take 1 tablet (10 mg total) by mouth every 6 (six) hours as needed (Nausea or vomiting). Patient taking differently: Take 10 mg by mouth every 6 (six) hours as needed for nausea or vomiting.  10/18/19   Truitt Merle, MD  rizatriptan (MAXALT-MLT) 10 MG disintegrating tablet Take 1 tablet (10 mg total) by mouth as needed for migraine. May repeat in 2 hours if needed 04/19/19   Melvenia Beam, MD     Family History  Problem Relation Age of Onset  . Asthma Mother   . Diabetes Mother   . Hypertension Mother   . Heart attack Father 31  . CAD Father   .  Stroke Maternal Aunt   . Stroke Paternal Aunt   . Stroke Paternal Uncle   . Gout Other        uncles   . Cancer Niece        paternal half sister's daughter; unknown type; unknown age diagnosed  . Ovarian cancer Neg Hx   . Breast cancer Neg Hx   . Colon cancer Neg Hx   . Migraines Neg Hx     Social History   Socioeconomic History  . Marital status: Married    Spouse name: Not on file  . Number of children: 0  . Years of education: Not on file  . Highest education level: Some college, no degree  Occupational History  . Occupation: Customer service manager   Tobacco Use  . Smoking status: Never Smoker  . Smokeless tobacco: Never Used  Vaping Use  . Vaping Use: Never used  Substance and Sexual Activity  . Alcohol use: No  . Drug use: No  . Sexual activity: Not on file  Other Topics Concern  . 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 Difficulty of Paying Living Expenses: Not on file   Food Insecurity:   . Worried About Charity fundraiser in the Last Year: Not on file  . Ran Out of Food in the Last Year: Not on file  Transportation Needs:   . Lack of Transportation (Medical): Not on file  . Lack of Transportation (Non-Medical): Not on file  Physical Activity:   . Days of Exercise per Week: Not on file  . Minutes of Exercise per Session: Not on file  Stress:   . Feeling of Stress : Not on file  Social Connections:   . Frequency of Communication with Friends and Family: Not on file  . Frequency of Social Gatherings with Friends and Family: Not on file  . Attends Religious Services: Not on file  . Active Member of Clubs or Organizations: Not on file  . Attends Archivist Meetings: Not on file  . Marital Status: Not on file     Review of Systems: A 12 point ROS discussed and pertinent positives are indicated in the HPI above.  All other systems are negative.  Review of Systems  Constitutional: Positive for fatigue. Negative for fever.  HENT: Negative for congestion.   Respiratory: Negative for cough and shortness of breath.   Gastrointestinal: Negative for abdominal pain, diarrhea, nausea and vomiting.    Vital Signs: BP (!) 144/93   Pulse (!) 117 Comment: RN notified  Temp 98.1 F (36.7 C) (Oral)   Resp 18   SpO2 100%   Physical Exam Vitals and nursing note reviewed.  Constitutional:      Appearance: She is well-developed. She is ill-appearing.  HENT:     Head: Normocephalic and atraumatic.  Eyes:     Conjunctiva/sclera: Conjunctivae normal.  Cardiovascular:     Rate and Rhythm: Regular rhythm. Tachycardia present.     Pulses: Normal pulses.     Heart sounds: Normal heart sounds.  Pulmonary:     Effort: Pulmonary effort is normal.  Musculoskeletal:     Cervical back:  Normal range of motion.  Neurological:     Mental Status: She is alert and oriented to person, place, and time.     Imaging: DG Chest 1 View  Result Date:  10/18/2019 CLINICAL DATA:  Pneumothorax EXAM: CHEST  1 VIEW COMPARISON:  Chest x-ray 10/11/2019, CT 10/15/2019 FINDINGS: Small left apical pneumothorax is again identified and appears enlarged in size since prior examination. Mild left basilar atelectasis is present with elevation of the left hemidiaphragm, new since prior examination. Lungs are clear otherwise. No pneumothorax or pleural effusion on the right. Cardiac size within normal limits. Left subclavian chest port is unchanged with its tip within the superior vena cava. Pulmonary vascularity is normal. IMPRESSION: Small, but enlarging left apical pneumothorax in keeping with ongoing air leak. Electronically Signed   By: Fidela Salisbury MD   On: 10/18/2019 22:58   DG Chest 2 View  Result Date: 10/28/2019 CLINICAL DATA:  Elevated white count EXAM: CHEST - 2 VIEW COMPARISON:  None. FINDINGS: The heart size and mediastinal contours are within normal limits. Again noted is streaky airspace opacity at the left lung base. A left-sided MediPort catheter seen with the tip at the superior cavoatrial junction. The visualized skeletal structures are unremarkable. IMPRESSION: Unchanged since October 18, 2019, however new since October 11, 2019 streaky airspace opacity at the left lung base which could be due to subsegmental atelectasis and/or infectious etiology. Electronically Signed   By: Prudencio Pair M.D.   On: 10/28/2019 22:03   CT Chest Wo Contrast  Result Date: 10/15/2019 CLINICAL DATA:  Pancreatic cancer.  Staging. EXAM: CT CHEST WITHOUT CONTRAST TECHNIQUE: Multidetector CT imaging of the chest was performed following the standard protocol without IV contrast. COMPARISON:  None. FINDINGS: Cardiovascular: The heart size is normal. No substantial pericardial effusion. Left Port-A-Cath tip is in the mid to distal SVC. Mediastinum/Nodes: No mediastinal lymphadenopathy. No evidence for gross hilar lymphadenopathy although assessment is limited by the lack of  intravenous contrast on today's study. The esophagus has normal imaging features. Increased number of bilateral axillary and subpectoral lymph nodes identified with mild lymphadenopathy in the left axilla (index 13 mm short axis visible on image 51/2. Right axillary nodes measure up to 10 mm short axis. Lungs/Pleura: Small anterior left pneumothorax evident. Minimal dependent atelectasis without substantial pleural effusion. No suspicious pulmonary nodule or mass within the aerated lungs. Upper Abdomen: Main duct dilatation noted in the pancreas with multiple hypoattenuating liver lesions, better assessed on abdomen CT 09/19/2019. Musculoskeletal: No worrisome lytic or sclerotic osseous abnormality. IMPRESSION: 1. Small anterior left pneumothorax with dependent atelectasis in the left lower lobe. 2. Increased number of bilateral axillary and subpectoral lymph nodes with mild lymphadenopathy in the left axilla. While this would be an atypical presentation for metastatic pancreatic cancer, this possibility is not excluded 3. Main duct dilatation in the pancreas, better assessed on abdomen CT 09/19/2019. These results will be called to the ordering clinician or representative by the Radiologist Assistant, and communication documented in the PACS or [employer]. Electronically Signed   By: Misty Stanley M.D.   On: 10/15/2019 08:12   MR Abdomen W Wo Contrast  Addendum Date: 10/16/2019   ADDENDUM REPORT: 10/16/2019 14:14 ADDENDUM: These results were called by telephone at the time of interpretation on 10/16/2019 at 2:03 pm to provider Truitt Merle , who verbally acknowledged these results. Electronically Signed   By: Ilona Sorrel M.D.   On: 10/16/2019 14:14   Result Date: 10/16/2019 CLINICAL DATA:  Recent diagnosis  of biopsy. Pancreatic head adenocarcinoma. Indeterminate liver lesions on CT. EXAM: MRI ABDOMEN WITHOUT AND WITH CONTRAST (INCLUDING MRCP) TECHNIQUE: Multiplanar multisequence MR imaging of the abdomen  was performed both before and after the administration of intravenous contrast. Heavily T2-weighted images of the biliary and pancreatic ducts were obtained, and three-dimensional MRCP images were rendered by post processing. CONTRAST:  3mL GADAVIST GADOBUTROL 1 MMOL/ML IV SOLN COMPARISON:  09/19/2019 CT abdomen. FINDINGS: Substantially motion degraded scan, particularly on the postcontrast sequences, significantly limiting assessment. Lower chest: Mild dependent left lung base atelectasis. Probable persistent anterior small left lung base pneumothorax, better seen on chest CT from 1 day prior. Hepatobiliary: Normal liver size and configuration. No hepatic steatosis. Numerous (greater than 10) small similar mildly T2 hyperintense T1 hypointense liver masses scattered throughout the liver, largest 1.0 cm anteriorly at the liver dome (series 4/image 7), 1.0 cm posteriorly at the right liver dome (series 4/image 9) and 0.9 cm inferiorly in the liver near the gallbladder (series 4/image 21). These lesions appear to demonstrate targetoid enhancement on the limited motion degraded postcontrast sequences (for example on series 17/image 12 anteriorly at the liver dome). Normal gallbladder with no cholelithiasis. No biliary ductal dilatation. Common bile duct diameter 5 mm. No choledocholithiasis. No biliary masses or beading. Pancreas: Poorly marginated hypoenhancing 3.7 x 2.9 cm pancreatic head mass (series 4/image 23), which appears to invade the anterior peripancreatic fat. Diffuse irregular dilatation of main pancreatic duct in pancreatic body and tail up to 6 mm diameter. No pancreas divisum. Spleen: Normal size. No mass. Adrenals/Urinary Tract: Normal adrenals. No hydronephrosis. Normal kidneys with no renal mass. Stomach/Bowel: Normal non-distended stomach. Visualized small and large bowel is normal caliber, with no bowel wall thickening. Vascular/Lymphatic: Normal caliber abdominal aorta. Mild narrowing of the main  portal vein by the pancreatic head mass. SMV, splenic vein, right and left portal veins, renal veins and hepatic veins appear patent. No evidence of tumor involvement of SMA. Mild porta hepatis adenopathy up to 1.0 cm (series 25/image 38). Other: No abdominal ascites or focal fluid collection. Musculoskeletal: No aggressive appearing focal osseous lesions. IMPRESSION: 1. Substantially motion degraded scan. 2. Probable persistent small anterior left lung base pneumothorax, better seen on chest CT from 1 day prior. 3. Poorly marginated hypoenhancing 3.7 x 2.9 cm pancreatic head mass, which appears to invade the anterior peripancreatic fat, compatible with known pancreatic adenocarcinoma. Diffuse irregular dilatation of the main pancreatic duct in the pancreatic body and tail. Mild narrowing of the main portal vein by the mass. Abdominal vasculature remains patent and otherwise uninvolved. 4. Numerous (greater than 10) small liver masses scattered throughout the liver, largest 1.0 cm, which appear to demonstrate targetoid enhancement on the limited motion degraded postcontrast sequences, compatible with liver metastases. 5. Mild porta hepatis adenopathy, suspicious for metastatic disease. Electronically Signed: By: Ilona Sorrel M.D. On: 10/16/2019 14:04   MR 3D Recon At Scanner  Addendum Date: 10/16/2019   ADDENDUM REPORT: 10/16/2019 14:14 ADDENDUM: These results were called by telephone at the time of interpretation on 10/16/2019 at 2:03 pm to provider Truitt Merle , who verbally acknowledged these results. Electronically Signed   By: Ilona Sorrel M.D.   On: 10/16/2019 14:14   Result Date: 10/16/2019 CLINICAL DATA:  Recent diagnosis of biopsy. Pancreatic head adenocarcinoma. Indeterminate liver lesions on CT. EXAM: MRI ABDOMEN WITHOUT AND WITH CONTRAST (INCLUDING MRCP) TECHNIQUE: Multiplanar multisequence MR imaging of the abdomen was performed both before and after the administration of intravenous contrast. Heavily  T2-weighted  images of the biliary and pancreatic ducts were obtained, and three-dimensional MRCP images were rendered by post processing. CONTRAST:  68mL GADAVIST GADOBUTROL 1 MMOL/ML IV SOLN COMPARISON:  09/19/2019 CT abdomen. FINDINGS: Substantially motion degraded scan, particularly on the postcontrast sequences, significantly limiting assessment. Lower chest: Mild dependent left lung base atelectasis. Probable persistent anterior small left lung base pneumothorax, better seen on chest CT from 1 day prior. Hepatobiliary: Normal liver size and configuration. No hepatic steatosis. Numerous (greater than 10) small similar mildly T2 hyperintense T1 hypointense liver masses scattered throughout the liver, largest 1.0 cm anteriorly at the liver dome (series 4/image 7), 1.0 cm posteriorly at the right liver dome (series 4/image 9) and 0.9 cm inferiorly in the liver near the gallbladder (series 4/image 21). These lesions appear to demonstrate targetoid enhancement on the limited motion degraded postcontrast sequences (for example on series 17/image 12 anteriorly at the liver dome). Normal gallbladder with no cholelithiasis. No biliary ductal dilatation. Common bile duct diameter 5 mm. No choledocholithiasis. No biliary masses or beading. Pancreas: Poorly marginated hypoenhancing 3.7 x 2.9 cm pancreatic head mass (series 4/image 23), which appears to invade the anterior peripancreatic fat. Diffuse irregular dilatation of main pancreatic duct in pancreatic body and tail up to 6 mm diameter. No pancreas divisum. Spleen: Normal size. No mass. Adrenals/Urinary Tract: Normal adrenals. No hydronephrosis. Normal kidneys with no renal mass. Stomach/Bowel: Normal non-distended stomach. Visualized small and large bowel is normal caliber, with no bowel wall thickening. Vascular/Lymphatic: Normal caliber abdominal aorta. Mild narrowing of the main portal vein by the pancreatic head mass. SMV, splenic vein, right and left portal veins,  renal veins and hepatic veins appear patent. No evidence of tumor involvement of SMA. Mild porta hepatis adenopathy up to 1.0 cm (series 25/image 38). Other: No abdominal ascites or focal fluid collection. Musculoskeletal: No aggressive appearing focal osseous lesions. IMPRESSION: 1. Substantially motion degraded scan. 2. Probable persistent small anterior left lung base pneumothorax, better seen on chest CT from 1 day prior. 3. Poorly marginated hypoenhancing 3.7 x 2.9 cm pancreatic head mass, which appears to invade the anterior peripancreatic fat, compatible with known pancreatic adenocarcinoma. Diffuse irregular dilatation of the main pancreatic duct in the pancreatic body and tail. Mild narrowing of the main portal vein by the mass. Abdominal vasculature remains patent and otherwise uninvolved. 4. Numerous (greater than 10) small liver masses scattered throughout the liver, largest 1.0 cm, which appear to demonstrate targetoid enhancement on the limited motion degraded postcontrast sequences, compatible with liver metastases. 5. Mild porta hepatis adenopathy, suspicious for metastatic disease. Electronically Signed: By: Ilona Sorrel M.D. On: 10/16/2019 14:04   DG CHEST PORT 1 VIEW  Result Date: 10/11/2019 CLINICAL DATA:  Port-A-Cath placement.  Pancreatic carcinoma EXAM: PORTABLE CHEST 1 VIEW COMPARISON:  November 30, 2012. FINDINGS: Port-A-Cath tip is in the superior vena cava. No pneumothorax. The lungs are clear. The heart size and pulmonary vascularity are within normal limits. No adenopathy. No bone lesions. IMPRESSION: Port-A-Cath tip in superior vena cava. No pneumothorax. Lungs clear. No adenopathy. Electronically Signed   By: Lowella Grip III M.D.   On: 10/11/2019 09:11   DG Fluoro Guide CV Line-No Report  Result Date: 10/11/2019 Fluoroscopy was utilized by the requesting physician.  No radiographic interpretation.   MM DIAG BREAST TOMO UNI LEFT  Addendum Date: 10/25/2019   ADDENDUM  REPORT: 10/25/2019 14:57 ADDENDUM: I spoke with the patient on the morning of 10/25/2019. She states her 2nd COVID vaccination was on 09/23/2019. She  prefers not to wait much longer for re-evaluation. She has been scheduled for a follow-up ultrasound on 11/05/2019. This will be 6 weeks after the second COVID dose. If her lymphadenopathy has not improved, ultrasound-guided biopsy is recommended. A biopsy slot is being held for the patient on the same day. Electronically Signed   By: Kristopher Oppenheim M.D.   On: 10/25/2019 14:57   Addendum Date: 10/24/2019   ADDENDUM REPORT: 10/24/2019 15:27 ADDENDUM: This is an addendum to the report originally dictated on 10/15/2019. I spoke with the patient's referring provider Dr. Burr Medico today, who provided additional history that the patient had the second COVID vaccine recently within her left arm. This may account for her current imaging findings. Therefore, the IMPRESSION, RECOMMENDATION BI-RADS CATEGORY should read as follows. IMPRESSION: 1. Left axillary lymphadenopathy in keeping with recent CT findings may be related to recent second COVID vaccine in the ipsilateral arm. 2. No mammographic evidence of malignancy within the left breast. RECOMMENDATION: Recommendation is for the patient to return in 8 weeks for follow-up ultrasound evaluation of the left axilla to ensure improvement/resolution of her current lymphadenopathy. BI-RADS CATEGORY: 3: Probably benign. Electronically Signed   By: Kristopher Oppenheim M.D.   On: 10/24/2019 15:27   Result Date: 10/25/2019 CLINICAL DATA:  59 year old female, recently diagnosed with pancreatic cancer and prominent left axillary lymphadenopathy on recent CT evaluation. The patient had a port placed on the left side approximately 2 weeks ago. EXAM: DIGITAL DIAGNOSTIC LEFT MAMMOGRAM WITH CAD AND TOMO ULTRASOUND LEFT BREAST COMPARISON:  Previous exam(s). ACR Breast Density Category c: The breast tissue is heterogeneously dense, which may obscure  small masses. FINDINGS: No suspicious mammographic findings are identified in the left breast. The parenchymal pattern is stable. Mammographic images were processed with CAD. Targeted ultrasound is performed, showing multiple prominent left axillary lymph nodes with diffuse cortical thickening up to 5-6 mm. IMPRESSION: 1. Left axillary lymphadenopathy in keeping with recent CT findings. Recommendation is for ultrasound-guided biopsy. The differential includes metastatic disease, lymphoproliferative disorder and benign reactive changes. 2. No mammographic evidence of malignancy within the left breast. RECOMMENDATION: Ultrasound-guided biopsy of a left axillary lymph node is recommended. Samples should be sent in both formalin and saline. I have discussed the findings and recommendations with the patient. If applicable, a reminder letter will be sent to the patient regarding the next appointment. BI-RADS CATEGORY  4: Suspicious. Electronically Signed: By: Kristopher Oppenheim M.D. On: 10/23/2019 10:55   [place] AXILLA LEFT  Addendum Date: 10/25/2019   ADDENDUM REPORT: 10/25/2019 14:57 ADDENDUM: I spoke with the patient on the morning of 10/25/2019. She states her 2nd COVID vaccination was on 09/23/2019. She prefers not to wait much longer for re-evaluation. She has been scheduled for a follow-up ultrasound on 11/05/2019. This will be 6 weeks after the second COVID dose. If her lymphadenopathy has not improved, ultrasound-guided biopsy is recommended. A biopsy slot is being held for the patient on the same day. Electronically Signed   By: Kristopher Oppenheim M.D.   On: 10/25/2019 14:57   Addendum Date: 10/24/2019   ADDENDUM REPORT: 10/24/2019 15:27 ADDENDUM: This is an addendum to the report originally dictated on 10/15/2019. I spoke with the patient's referring provider Dr. Burr Medico today, who provided additional history that the patient had the second COVID vaccine recently within her left arm. This may account for her current  imaging findings. Therefore, the IMPRESSION, RECOMMENDATION BI-RADS CATEGORY should read as follows. IMPRESSION: 1. Left axillary lymphadenopathy in keeping with recent CT  findings may be related to recent second COVID vaccine in the ipsilateral arm. 2. No mammographic evidence of malignancy within the left breast. RECOMMENDATION: Recommendation is for the patient to return in 8 weeks for follow-up ultrasound evaluation of the left axilla to ensure improvement/resolution of her current lymphadenopathy. BI-RADS CATEGORY: 3: Probably benign. Electronically Signed   By: Kristopher Oppenheim M.D.   On: 10/24/2019 15:27   Result Date: 10/25/2019 CLINICAL DATA:  59 year old female, recently diagnosed with pancreatic cancer and prominent left axillary lymphadenopathy on recent CT evaluation. The patient had a port placed on the left side approximately 2 weeks ago. EXAM: DIGITAL DIAGNOSTIC LEFT MAMMOGRAM WITH CAD AND TOMO ULTRASOUND LEFT BREAST COMPARISON:  Previous exam(s). ACR Breast Density Category c: The breast tissue is heterogeneously dense, which may obscure small masses. FINDINGS: No suspicious mammographic findings are identified in the left breast. The parenchymal pattern is stable. Mammographic images were processed with CAD. Targeted ultrasound is performed, showing multiple prominent left axillary lymph nodes with diffuse cortical thickening up to 5-6 mm. IMPRESSION: 1. Left axillary lymphadenopathy in keeping with recent CT findings. Recommendation is for ultrasound-guided biopsy. The differential includes metastatic disease, lymphoproliferative disorder and benign reactive changes. 2. No mammographic evidence of malignancy within the left breast. RECOMMENDATION: Ultrasound-guided biopsy of a left axillary lymph node is recommended. Samples should be sent in both formalin and saline. I have discussed the findings and recommendations with the patient. If applicable, a reminder letter will be sent to the patient  regarding the next appointment. BI-RADS CATEGORY  4: Suspicious. Electronically Signed: By: Kristopher Oppenheim M.D. On: 10/23/2019 10:55    Labs:  CBC: Recent Labs    10/24/19 0949 10/25/19 1224 10/28/19 2115 10/30/19 1205  WBC 4.1 9.2 26.2* 10.3  HGB 9.9* 9.7* 9.9* 9.5*  HCT 28.1* 28.3* 28.6* 27.8*  PLT 267 299 228 166    COAGS: Recent Labs    04/22/19 1222  INR 1.0  APTT 25    BMP: Recent Labs    04/22/19 1222 10/24/19 0949 10/25/19 1224 10/28/19 2115  NA 142 134* 138 136  K 3.3* 3.1* 3.4* 2.8*  CL 106 98 101 97*  CO2 26 31 29 28   GLUCOSE 78 101* 113* 102*  BUN 17 15 15 12   CALCIUM 10.4* 9.9 10.1 9.2  CREATININE 0.89 0.94 0.95 0.71  GFRNONAA >60 >60 >60 >60  GFRAA >60 >60 >60 >60    LIVER FUNCTION TESTS: Recent Labs    04/22/19 1222 10/24/19 0949 10/25/19 1224 10/28/19 2115  BILITOT 0.7 0.5 0.4 1.1  AST 26 28 22  47*  ALT 25 27 21  43  ALKPHOS 71 102 104 136*  PROT 8.6* 7.9 7.6 7.3  ALBUMIN 4.8 3.6 3.5 3.6    Assessment and Plan:  59 y.o. female outpatient. History of Lupus, HTN, iron deficiency anemia, DDD s/p L5 discectomy. Recently diagnosed with pancreatic adenocarcinoma. MR from 9.29.21 shows numerous liver masses. Team is requesting a liver biopsy for further determination of possible metsastes.    Potassium 3.0, Alkaline Phosphatase 142  all other labs and medications are within acceptable parameters. No pertinent allergies. Patient has been NPO since midnight.    Risks and benefits of liver biopsy was discussed with the patient and/or patient's family including, but not limited to bleeding, infection, damage to adjacent structures or low yield requiring additional tests.  All of the questions were answered and there is agreement to proceed.  Consent signed and in chart.  Thank you for this  interesting consult.  I greatly enjoyed meeting Ronnita A Wee and look forward to participating in their care.  A copy of this report was sent to the  requesting provider on this date.  Electronically Signed: Jacqualine Mau, NP 10/30/2019, 12:20 PM   I spent a total of  40 Minutes   in face to face in clinical consultation, greater than 50% of which was counseling/coordinating care for liver biopsy

## 2019-10-31 ENCOUNTER — Telehealth: Payer: Self-pay | Admitting: Nurse Practitioner

## 2019-10-31 NOTE — Telephone Encounter (Signed)
No 10/4 los °

## 2019-11-02 ENCOUNTER — Telehealth: Payer: Self-pay

## 2019-11-02 ENCOUNTER — Other Ambulatory Visit: Payer: BC Managed Care – PPO

## 2019-11-02 LAB — SURGICAL PATHOLOGY

## 2019-11-02 NOTE — Telephone Encounter (Signed)
Patient calls had nausea last night couldn't eat, doesn't like to take the Compazine because it makes her sleepy and is at home by herself during the day.  Having abdominal cramping has not been taking the Oxycodone.  I advised her to take Zofran during the day as this will not make her sleepy.  Also take the Oxycodone for the abdominal cramping.    She states she will try the suggestions given.  She will let us know if does not help.

## 2019-11-03 LAB — CULTURE, BLOOD (ROUTINE X 2)
Culture: NO GROWTH
Culture: NO GROWTH
Special Requests: ADEQUATE
Special Requests: ADEQUATE

## 2019-11-04 ENCOUNTER — Encounter (HOSPITAL_COMMUNITY): Payer: Self-pay

## 2019-11-04 ENCOUNTER — Other Ambulatory Visit: Payer: Self-pay

## 2019-11-04 ENCOUNTER — Emergency Department (HOSPITAL_COMMUNITY): Payer: BC Managed Care – PPO

## 2019-11-04 ENCOUNTER — Inpatient Hospital Stay (HOSPITAL_COMMUNITY)
Admission: EM | Admit: 2019-11-04 | Discharge: 2019-11-09 | DRG: 394 | Disposition: A | Discharge status: Home / Self Care | Payer: BC Managed Care – PPO | Attending: Internal Medicine | Admitting: Internal Medicine

## 2019-11-04 DIAGNOSIS — R197 Diarrhea, unspecified: Secondary | ICD-10-CM | POA: Diagnosis not present

## 2019-11-04 DIAGNOSIS — Z20822 Contact with and (suspected) exposure to covid-19: Secondary | ICD-10-CM | POA: Diagnosis present

## 2019-11-04 DIAGNOSIS — Z825 Family history of asthma and other chronic lower respiratory diseases: Secondary | ICD-10-CM | POA: Diagnosis not present

## 2019-11-04 DIAGNOSIS — J45909 Unspecified asthma, uncomplicated: Secondary | ICD-10-CM | POA: Diagnosis present

## 2019-11-04 DIAGNOSIS — K838 Other specified diseases of biliary tract: Secondary | ICD-10-CM | POA: Diagnosis not present

## 2019-11-04 DIAGNOSIS — C787 Secondary malignant neoplasm of liver and intrahepatic bile duct: Secondary | ICD-10-CM | POA: Diagnosis not present

## 2019-11-04 DIAGNOSIS — Y929 Unspecified place or not applicable: Secondary | ICD-10-CM | POA: Diagnosis not present

## 2019-11-04 DIAGNOSIS — R1110 Vomiting, unspecified: Secondary | ICD-10-CM | POA: Diagnosis not present

## 2019-11-04 DIAGNOSIS — K3189 Other diseases of stomach and duodenum: Secondary | ICD-10-CM | POA: Diagnosis not present

## 2019-11-04 DIAGNOSIS — R112 Nausea with vomiting, unspecified: Secondary | ICD-10-CM

## 2019-11-04 DIAGNOSIS — Z8249 Family history of ischemic heart disease and other diseases of the circulatory system: Secondary | ICD-10-CM | POA: Diagnosis not present

## 2019-11-04 DIAGNOSIS — I10 Essential (primary) hypertension: Secondary | ICD-10-CM | POA: Diagnosis present

## 2019-11-04 DIAGNOSIS — Z833 Family history of diabetes mellitus: Secondary | ICD-10-CM

## 2019-11-04 DIAGNOSIS — Z823 Family history of stroke: Secondary | ICD-10-CM | POA: Diagnosis not present

## 2019-11-04 DIAGNOSIS — K521 Toxic gastroenteritis and colitis: Secondary | ICD-10-CM | POA: Diagnosis not present

## 2019-11-04 DIAGNOSIS — D6481 Anemia due to antineoplastic chemotherapy: Secondary | ICD-10-CM | POA: Diagnosis present

## 2019-11-04 DIAGNOSIS — M329 Systemic lupus erythematosus, unspecified: Secondary | ICD-10-CM | POA: Diagnosis not present

## 2019-11-04 DIAGNOSIS — D84821 Immunodeficiency due to drugs: Secondary | ICD-10-CM | POA: Diagnosis not present

## 2019-11-04 DIAGNOSIS — C259 Malignant neoplasm of pancreas, unspecified: Secondary | ICD-10-CM | POA: Diagnosis present

## 2019-11-04 DIAGNOSIS — R000 Tachycardia, unspecified: Secondary | ICD-10-CM | POA: Diagnosis not present

## 2019-11-04 DIAGNOSIS — K6389 Other specified diseases of intestine: Secondary | ICD-10-CM | POA: Diagnosis not present

## 2019-11-04 DIAGNOSIS — Z91018 Allergy to other foods: Secondary | ICD-10-CM | POA: Diagnosis not present

## 2019-11-04 DIAGNOSIS — K529 Noninfective gastroenteritis and colitis, unspecified: Secondary | ICD-10-CM | POA: Diagnosis present

## 2019-11-04 DIAGNOSIS — Z8507 Personal history of malignant neoplasm of pancreas: Secondary | ICD-10-CM | POA: Diagnosis not present

## 2019-11-04 DIAGNOSIS — F419 Anxiety disorder, unspecified: Secondary | ICD-10-CM | POA: Diagnosis present

## 2019-11-04 DIAGNOSIS — C250 Malignant neoplasm of head of pancreas: Secondary | ICD-10-CM | POA: Diagnosis present

## 2019-11-04 DIAGNOSIS — R634 Abnormal weight loss: Secondary | ICD-10-CM | POA: Diagnosis not present

## 2019-11-04 DIAGNOSIS — R6510 Systemic inflammatory response syndrome (SIRS) of non-infectious origin without acute organ dysfunction: Secondary | ICD-10-CM | POA: Diagnosis present

## 2019-11-04 DIAGNOSIS — T451X5A Adverse effect of antineoplastic and immunosuppressive drugs, initial encounter: Secondary | ICD-10-CM | POA: Diagnosis not present

## 2019-11-04 DIAGNOSIS — Z91010 Allergy to peanuts: Secondary | ICD-10-CM | POA: Diagnosis not present

## 2019-11-04 DIAGNOSIS — G43909 Migraine, unspecified, not intractable, without status migrainosus: Secondary | ICD-10-CM | POA: Diagnosis present

## 2019-11-04 DIAGNOSIS — I82409 Acute embolism and thrombosis of unspecified deep veins of unspecified lower extremity: Secondary | ICD-10-CM | POA: Diagnosis not present

## 2019-11-04 DIAGNOSIS — E876 Hypokalemia: Secondary | ICD-10-CM | POA: Diagnosis not present

## 2019-11-04 DIAGNOSIS — I82422 Acute embolism and thrombosis of left iliac vein: Secondary | ICD-10-CM | POA: Diagnosis present

## 2019-11-04 DIAGNOSIS — G43709 Chronic migraine without aura, not intractable, without status migrainosus: Secondary | ICD-10-CM

## 2019-11-04 DIAGNOSIS — K5100 Ulcerative (chronic) pancolitis without complications: Secondary | ICD-10-CM

## 2019-11-04 DIAGNOSIS — Z90710 Acquired absence of both cervix and uterus: Secondary | ICD-10-CM

## 2019-11-04 DIAGNOSIS — C251 Malignant neoplasm of body of pancreas: Secondary | ICD-10-CM | POA: Diagnosis not present

## 2019-11-04 DIAGNOSIS — D630 Anemia in neoplastic disease: Secondary | ICD-10-CM | POA: Diagnosis present

## 2019-11-04 HISTORY — DX: Systemic involvement of connective tissue, unspecified: M35.9

## 2019-11-04 LAB — CBC WITH DIFFERENTIAL/PLATELET
Abs Immature Granulocytes: 0.1 10*3/uL — ABNORMAL HIGH (ref 0.00–0.07)
Basophils Absolute: 0.1 10*3/uL (ref 0.0–0.1)
Basophils Relative: 1 %
Eosinophils Absolute: 0.2 10*3/uL (ref 0.0–0.5)
Eosinophils Relative: 3 %
HCT: 29.3 % — ABNORMAL LOW (ref 36.0–46.0)
Hemoglobin: 10 g/dL — ABNORMAL LOW (ref 12.0–15.0)
Immature Granulocytes: 1 %
Lymphocytes Relative: 11 %
Lymphs Abs: 1 10*3/uL (ref 0.7–4.0)
MCH: 29.7 pg (ref 26.0–34.0)
MCHC: 34.1 g/dL (ref 30.0–36.0)
MCV: 86.9 fL (ref 80.0–100.0)
Monocytes Absolute: 1.2 10*3/uL — ABNORMAL HIGH (ref 0.1–1.0)
Monocytes Relative: 13 %
Neutro Abs: 6.3 10*3/uL (ref 1.7–7.7)
Neutrophils Relative %: 71 %
Platelets: 194 10*3/uL (ref 150–400)
RBC: 3.37 MIL/uL — ABNORMAL LOW (ref 3.87–5.11)
RDW: 12.9 % (ref 11.5–15.5)
WBC: 8.9 10*3/uL (ref 4.0–10.5)
nRBC: 0 % (ref 0.0–0.2)

## 2019-11-04 LAB — COMPREHENSIVE METABOLIC PANEL
ALT: 33 U/L (ref 0–44)
AST: 31 U/L (ref 15–41)
Albumin: 3.5 g/dL (ref 3.5–5.0)
Alkaline Phosphatase: 111 U/L (ref 38–126)
Anion gap: 13 (ref 5–15)
BUN: 9 mg/dL (ref 6–20)
CO2: 28 mmol/L (ref 22–32)
Calcium: 9.5 mg/dL (ref 8.9–10.3)
Chloride: 95 mmol/L — ABNORMAL LOW (ref 98–111)
Creatinine, Ser: 0.8 mg/dL (ref 0.44–1.00)
GFR, Estimated: >60 mL/min (ref >60)
Glucose, Bld: 113 mg/dL — ABNORMAL HIGH (ref 70–99)
Potassium: 2.9 mmol/L — ABNORMAL LOW (ref 3.5–5.1)
Sodium: 136 mmol/L (ref 135–145)
Total Bilirubin: 0.5 mg/dL (ref 0.3–1.2)
Total Protein: 7.5 g/dL (ref 6.5–8.1)

## 2019-11-04 LAB — LIPASE, BLOOD: Lipase: 19 U/L (ref 11–51)

## 2019-11-04 LAB — RESPIRATORY PANEL BY RT PCR (FLU A&B, COVID)
Influenza A by PCR: NEGATIVE
Influenza B by PCR: NEGATIVE
SARS Coronavirus 2 by RT PCR: NEGATIVE

## 2019-11-04 LAB — MAGNESIUM: Magnesium: 1.6 mg/dL — ABNORMAL LOW (ref 1.7–2.4)

## 2019-11-04 MED ORDER — METOPROLOL TARTRATE 5 MG/5ML IV SOLN
5.0000 mg | Freq: Four times a day (QID) | INTRAVENOUS | Status: DC
  Administered 2019-11-04 – 2019-11-06 (×8): 5 mg via INTRAVENOUS
  Filled 2019-11-04 (×8): qty 5

## 2019-11-04 MED ORDER — POTASSIUM CHLORIDE 10 MEQ/100ML IV SOLN
10.0000 meq | Freq: Once | INTRAVENOUS | Status: AC
  Administered 2019-11-04: 10 meq via INTRAVENOUS
  Filled 2019-11-04: qty 100

## 2019-11-04 MED ORDER — POTASSIUM CHLORIDE 10 MEQ/100ML IV SOLN
10.0000 meq | INTRAVENOUS | Status: AC
  Administered 2019-11-04 – 2019-11-05 (×3): 10 meq via INTRAVENOUS
  Filled 2019-11-04 (×3): qty 100

## 2019-11-04 MED ORDER — MAGNESIUM SULFATE 2 GM/50ML IV SOLN
2.0000 g | Freq: Once | INTRAVENOUS | Status: AC
  Administered 2019-11-04: 2 g via INTRAVENOUS
  Filled 2019-11-04: qty 50

## 2019-11-04 MED ORDER — LABETALOL HCL 5 MG/ML IV SOLN
10.0000 mg | INTRAVENOUS | Status: DC | PRN
  Filled 2019-11-04: qty 4

## 2019-11-04 MED ORDER — ASPIRIN EC 81 MG PO TBEC
81.0000 mg | DELAYED_RELEASE_TABLET | Freq: Every day | ORAL | Status: DC
  Administered 2019-11-04 – 2019-11-09 (×6): 81 mg via ORAL
  Filled 2019-11-04 (×6): qty 1

## 2019-11-04 MED ORDER — SODIUM CHLORIDE 0.9 % IV SOLN
2.0000 g | INTRAVENOUS | Status: DC
  Administered 2019-11-05: 2 g via INTRAVENOUS
  Filled 2019-11-04: qty 2
  Filled 2019-11-04: qty 20

## 2019-11-04 MED ORDER — IOHEXOL 300 MG/ML  SOLN
100.0000 mL | Freq: Once | INTRAMUSCULAR | Status: AC | PRN
  Administered 2019-11-04: 100 mL via INTRAVENOUS

## 2019-11-04 MED ORDER — ONDANSETRON HCL 4 MG/2ML IJ SOLN
4.0000 mg | Freq: Once | INTRAMUSCULAR | Status: AC
  Administered 2019-11-04: 4 mg via INTRAVENOUS
  Filled 2019-11-04: qty 2

## 2019-11-04 MED ORDER — SODIUM CHLORIDE (PF) 0.9 % IJ SOLN
INTRAMUSCULAR | Status: AC
  Filled 2019-11-04: qty 50

## 2019-11-04 MED ORDER — TOPIRAMATE 25 MG PO TABS
50.0000 mg | ORAL_TABLET | Freq: Every day | ORAL | Status: DC
  Administered 2019-11-04 – 2019-11-08 (×5): 50 mg via ORAL
  Filled 2019-11-04 (×6): qty 2

## 2019-11-04 MED ORDER — ENOXAPARIN SODIUM 80 MG/0.8ML ~~LOC~~ SOLN
80.0000 mg | Freq: Two times a day (BID) | SUBCUTANEOUS | Status: DC
  Administered 2019-11-04 – 2019-11-08 (×9): 80 mg via SUBCUTANEOUS
  Filled 2019-11-04 (×10): qty 0.8

## 2019-11-04 MED ORDER — HYDROXYCHLOROQUINE SULFATE 200 MG PO TABS
400.0000 mg | ORAL_TABLET | Freq: Every day | ORAL | Status: DC
  Administered 2019-11-05 – 2019-11-09 (×5): 400 mg via ORAL
  Filled 2019-11-04 (×6): qty 2

## 2019-11-04 MED ORDER — OXYCODONE HCL 5 MG PO TABS: 5.0000 mg | ORAL_TABLET | Freq: Four times a day (QID) | ORAL | Status: DC | PRN

## 2019-11-04 MED ORDER — ACETAMINOPHEN 650 MG RE SUPP: 650.0000 mg | Freq: Four times a day (QID) | RECTAL | Status: DC | PRN

## 2019-11-04 MED ORDER — ACETAMINOPHEN 325 MG PO TABS
650.0000 mg | ORAL_TABLET | Freq: Four times a day (QID) | ORAL | Status: DC | PRN
  Filled 2019-11-04: qty 2

## 2019-11-04 MED ORDER — POTASSIUM CHLORIDE IN NACL 20-0.9 MEQ/L-% IV SOLN
INTRAVENOUS | Status: AC
  Filled 2019-11-04 (×3): qty 1000

## 2019-11-04 MED ORDER — METRONIDAZOLE IN NACL 5-0.79 MG/ML-% IV SOLN
500.0000 mg | Freq: Once | INTRAVENOUS | Status: AC
  Administered 2019-11-04: 500 mg via INTRAVENOUS
  Filled 2019-11-04: qty 100

## 2019-11-04 MED ORDER — METRONIDAZOLE IN NACL 5-0.79 MG/ML-% IV SOLN
500.0000 mg | Freq: Three times a day (TID) | INTRAVENOUS | Status: DC
  Administered 2019-11-05 – 2019-11-07 (×8): 500 mg via INTRAVENOUS
  Filled 2019-11-04 (×7): qty 100

## 2019-11-04 MED ORDER — LACTATED RINGERS IV BOLUS
1000.0000 mL | Freq: Once | INTRAVENOUS | Status: AC
  Administered 2019-11-04: 1000 mL via INTRAVENOUS

## 2019-11-04 MED ORDER — SODIUM CHLORIDE 0.9 % IV SOLN
2.0000 g | Freq: Once | INTRAVENOUS | Status: AC
  Administered 2019-11-04: 2 g via INTRAVENOUS
  Filled 2019-11-04: qty 20

## 2019-11-04 NOTE — Progress Notes (Signed)
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 Cinnamon Other (See Comments)    Other reaction(s): Other (See Comments) Unknown  On allergy test Unknown  On allergy test  . Peanut-Containing Drug Products Hives  . Prednisone Other (See Comments)    Interacts with another medicine she is taking  . Corn Oil Hives  . Corn-Containing Products Hives  . Other Rash    Red grapefruit and naval oranges- lips tingling and facial rash Potatoes, tomatoes, garlic, oregano/basil caused headache    Patient Measurements: Weight: 82.6 kg (182 lb) Last documented height 67", weight 84.8 kg  Vital Signs: Temp: 98.2 F (36.8 C) (10/10 1454) Temp Source: Oral (10/10 1454) BP: 116/82 (10/10 1954) Pulse Rate: 129 (10/10 1954)  Labs: Recent Labs    11/04/19 1549  HGB 10.0*  HCT 29.3*  PLT 194  CREATININE 0.80    Estimated Creatinine Clearance: 85.8 mL/min (by C-G formula based on SCr of 0.8 mg/dL).   Medical History: Past Medical History:  Diagnosis Date  . Allergies    peanuts, corn, beans, red grapefruit, naval oranges  . Asthma    allergy shots and medication  . Cancer [facility])    pancreatic  . Collagen vascular disease ([place])   . DDD (degenerative disc disease), lumbar   . Eustachian tube dysfunction 12/2012   rhinitis, vertigo- Dr. Wilburn Cornelia, ENT   . Fibroids   . Heart murmur    Echo 1/18: EF 55-60, no RWMA, normal diastolic function, trivial AI, PASP 32  . History of cardiac catheterization    LHC 3/18: normal coronary arteries.   . History of nuclear stress test    ETT-Myoview 2/18: EF 62, + ECG response; apical and distal septal ischemia; intermediate risk.  Marland Kitchen Hypertension   . Iron deficiency anemia   . Lupus [facility]) 2011   Dr. Trudie Reed  . Migraine headache    trial of generic maxalt 10 mg, January 2021  . Obesity   . Seasonal allergies   . Seizure in childhood  [facility])    as a child no treatment none x 30 years    Medications:  Scheduled:  . sodium chloride (PF)       Infusions:  . cefTRIAXone (ROCEPHIN)  IV    . metronidazole    . potassium chloride 10 mEq (11/04/19 1954)   Followed by  . potassium chloride      Assessment: 30 yoF admitted on 11/04/19 with N/V/D for 2 weeks after her first chemotherapy treatments on 9/29 for pancreatic cancer.  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 CT:  Filling defect in the LEFT internal iliac vein, likely a thrombus with differential considerations including mixing artifact. No prior to admission anticoagulation SCr <1 with CrCl ~ 85 ml/min CBC:  Hgb 10 (near baseline), and Plt 194 No bleeding or complications reported.  Goal of Therapy:  Anti-Xa level 0.6-1 units/ml 4hrs after LMWH dose given Monitor platelets by anticoagulation protocol: Yes   Plan:  Lovenox 1 mg/kg (80mg ) [place] q12h. Follow up imaging, renal function, CBC   Gretta Arab PharmD, BCPS Clinical Pharmacist [facility] main pharmacy [phone] 11/04/2019 8:21 PM

## 2019-11-04 NOTE — H&P (Addendum)
History and Physical    Cynthia Frye FGH:[id number] DOB: 08/19/1960 DOA: 11/04/2019  PCP: Josetta Huddle, MD  Patient coming from: Home.  Chief Complaint: Nausea vomiting diarrhea with abdominal cramps.  HPI: Cynthia Frye is a 59 y.o. female with history of pancreatic cancer recently started on chemotherapy 2 weeks ago presents to the ER because of persistent nausea vomiting and diarrhea unable to keep anything.  Patient states her symptoms started after the chemotherapy.  Which is progressively worsened with a crampy abdominal pain with diarrhea.  Denies any blood in the vomitus or diarrhea.  Denies any recent use of antibiotics.  Patient had come to the ER 2 days ago for the similar symptoms and was discharged home on antibiotics which patient was unable to take because of the vomiting.  Patient also was not able to take her blood pressure pills.  About 5 days ago patient did have a biopsy of the liver for lesions concerning for metastasis.  ED Course: In the ER CT abdomen pelvis shows pancolitis.  Labs are significant for severe hypokalemia and hypomagnesemia.  Hemoglobin is around 10 Covid test was negative.  Patient was started on empiric antibiotics C. difficile was ordered and potassium and magnesium replacements ordered IV fluids and admitted for further management.  CT scan also showed left sided iliac vein DVT and on-call oncologist advised patient to be started on Lovenox.  Review of Systems: As per HPI, rest all negative.   Past Medical History:  Diagnosis Date  . Allergies    peanuts, corn, beans, red grapefruit, naval oranges  . Asthma    allergy shots and medication  . Cancer [facility])    pancreatic  . Collagen vascular disease ([place])   . DDD (degenerative disc disease), lumbar   . Eustachian tube dysfunction 12/2012   rhinitis, vertigo- Dr. Wilburn Cornelia, ENT   . Fibroids   . Heart murmur    Echo 1/18: EF 55-60, no RWMA, normal diastolic function, trivial AI, PASP 32  .  History of cardiac catheterization    LHC 3/18: normal coronary arteries.   . History of nuclear stress test    ETT-Myoview 2/18: EF 62, + ECG response; apical and distal septal ischemia; intermediate risk.  Marland Kitchen Hypertension   . Iron deficiency anemia   . Lupus [facility]) 2011   Dr. Trudie Reed  . Migraine headache    trial of generic maxalt 10 mg, January 2021  . Obesity   . Seasonal allergies   . Seizure in childhood [facility])    as a child no treatment none x 30 years    Past Surgical History:  Procedure Laterality Date  . ABDOMINAL HYSTERECTOMY    . BACK SURGERY  2009  . COLONOSCOPY WITH PROPOFOL N/A 04/11/2012   Procedure: COLONOSCOPY WITH PROPOFOL;  Surgeon: Garlan Fair, MD;  Location: [facility];  Service: Endoscopy;  Laterality: N/A;  . HERNIA REPAIR     as child unbilical  . C7-E partial discectomy and laminectomy     Dr. Christella Noa  . LEFT HEART CATH AND CORONARY ANGIOGRAPHY N/A 04/09/2016   Procedure: Left Heart Cath and Coronary Angiography;  Surgeon: Nelva Bush, MD;  Location: [place] [facility];  Service: Cardiovascular;  Laterality: N/A;  . MOUTH SURGERY    . PORTACATH PLACEMENT N/A 10/11/2019   Procedure: INSERTION PORT-A-CATH LEFT SUBCLAVIAN;  Surgeon: [name], MD;  Location: [place];  Service: General;  Laterality: N/A;     reports that she has never  smoked. She has never used smokeless tobacco. She reports that she does not drink alcohol and does not use drugs.  Allergies  Allergen Reactions  . Cinnamon Other (See Comments)    Other reaction(s): Other (See Comments) Unknown  On allergy test Unknown  On allergy test  . Peanut-Containing Drug Products Hives  . Prednisone Other (See Comments)    Interacts with another medicine she is taking  . Corn Oil Hives  . Corn-Containing Products Hives  . Other Rash    Red grapefruit and naval oranges- lips tingling and facial rash Potatoes, tomatoes, garlic, oregano/basil caused headache     Family History  Problem Relation Age of Onset  . Asthma Mother   . Diabetes Mother   . Hypertension Mother   . Heart attack Father 20  . CAD Father   . Stroke Maternal Aunt   . Stroke Paternal Aunt   . Stroke Paternal Uncle   . Gout Other        uncles   . Cancer Niece        paternal half sister's daughter; unknown type; unknown age diagnosed  . Ovarian cancer Neg Hx   . Breast cancer Neg Hx   . Colon cancer Neg Hx   . Migraines Neg Hx     Prior to Admission medications   Medication Sig Start Date End Date Taking? Authorizing Provider  acetaminophen (TYLENOL) 500 MG tablet Take 1,000 mg by mouth every 6 (six) hours as needed for moderate pain.   Yes [provider]  albuterol (PROVENTIL HFA;VENTOLIN HFA) 108 (90 Base) MCG/ACT inhaler Inhale 2 puffs into the lungs every 6 (six) hours as needed for wheezing or shortness of breath. 04/29/16  Yes Harrison Mons, PA  aspirin 81 MG tablet Take 81 mg by mouth daily.   Yes [provider]  Bepotastine Besilate (BEPREVE) 1.5 % SOLN Place 1 drop into both eyes 2 (two) times daily as needed (DRY EYES).   Yes [provider]  cephALEXin (KEFLEX) 500 MG capsule Take 1 capsule (500 mg total) by mouth 3 (three) times daily. 10/30/19  Yes Alla Feeling, NP  Cholecalciferol (VITAMIN D) 2000 units CAPS Take 2,000 Units by mouth daily.    Yes [provider]  diclofenac (VOLTAREN) 75 MG EC tablet Take 75 mg by mouth 2 (two) times daily. 10/17/19  Yes [provider]  Dietary Management Product (RHEUMATE) CAPS Take 1 capsule by mouth daily.    Yes [provider]  DIPROLENE 0.05 % ointment Apply 1 application topically as needed (skin irritation).  04/08/15  Yes [provider]  esomeprazole (NEXIUM) 10 MG packet Take 10 mg by mouth daily as needed (heartburn).    Yes [provider]  ferrous sulfate 325 (65 FE) MG tablet Take 325 mg by mouth daily with breakfast.   Yes  [provider]  fluticasone (FLONASE) 50 MCG/ACT nasal spray Place 2 sprays into both nostrils daily. 04/29/16  Yes Jeffery, Domingo Mend, PA  hydroxychloroquine (PLAQUENIL) 200 MG tablet Take 400 mg by mouth daily.  06/17/14  Yes [provider]  lidocaine-prilocaine (EMLA) cream Apply to affected area once Patient taking differently: Apply 1 application topically once.  10/18/19  Yes Truitt Merle, MD  loratadine (CLARITIN) 10 MG tablet Take 10 mg by mouth daily.   Yes [provider]  losartan-hydrochlorothiazide (HYZAAR) 50-12.5 MG tablet Take 1 tablet by mouth daily.    Yes [provider]  metoprolol succinate (TOPROL-XL) 50 MG 24  hr tablet Take 50 mg by mouth daily.    Yes [provider]  mirtazapine (REMERON) 15 MG tablet Take 1 tablet (15 mg total) by mouth at bedtime. 10/25/19  Yes Alla Feeling, NP  ondansetron (ZOFRAN) 8 MG tablet Take 1 tablet (8 mg total) by mouth 2 (two) times daily as needed. Start on day 3 after chemotherapy. 10/18/19  Yes Truitt Merle, MD  oxyCODONE (OXY IR/ROXICODONE) 5 MG immediate release tablet Take 1 tablet (5 mg total) by mouth every 6 (six) hours as needed for severe pain. 10/11/19  Yes [name], MD  potassium chloride SA (KLOR-CON) 20 MEQ tablet Take 1 tablet (20 mEq total) by mouth 2 (two) times daily. 10/24/19  Yes Alla Feeling, NP  prochlorperazine (COMPAZINE) 10 MG tablet Take 1 tablet (10 mg total) by mouth every 6 (six) hours as needed (Nausea or vomiting). Patient taking differently: Take 10 mg by mouth every 6 (six) hours as needed for nausea or vomiting.  10/18/19  Yes Truitt Merle, MD  rizatriptan (MAXALT-MLT) 10 MG disintegrating tablet Take 1 tablet (10 mg total) by mouth as needed for migraine. May repeat in 2 hours if needed Patient taking differently: Take 10 mg by mouth daily as needed for migraine. May repeat in 2 hours if needed 04/19/19  Yes Melvenia Beam, MD  topiramate (TOPAMAX) 50 MG tablet Take 75mg  to  100mg  (1.5 - 2 tablets) daily at bedtime for headaches Patient taking differently: Take 50 mg by mouth at bedtime.  07/24/19  Yes Lomax, Amy, NP  ALPRAZolam (XANAX) 0.25 MG tablet Take 1 tablet (0.25 mg total) by mouth at bedtime as needed for anxiety. 10/18/19   Truitt Merle, MD  cetirizine (ZYRTEC) 10 MG chewable tablet Chew 10 mg by mouth daily.    [provider]  dexamethasone (DECADRON) 4 MG tablet Take 1 tablet (4 mg total) by mouth daily. Take 1 tablet once daily for 3 to 5 days after chemo 10/29/19   Alla Feeling, NP  methotrexate 50 MG/2ML injection Inject 25 mg into the skin once a week. 50mL = 25mg  06/17/14   [provider]    Physical Exam: Constitutional: Moderately built and nourished. Vitals:   11/04/19 1739 11/04/19 1954 11/04/19 2030 11/04/19 2033  BP: 138/86 116/82 127/77   Pulse: (!) 117 (!) 129 (!) 135   Resp: 19 (!) 22 16   Temp:      TempSrc:      SpO2: 100% 100% 96%   Weight:    82.6 kg   Eyes: Anicteric no pallor. ENMT: No discharge from the ears eyes nose or mouth. Neck: No mass felt.  No neck rigidity. Respiratory: No rhonchi or crepitations. Cardiovascular: S1-S2 heard. Abdomen: Soft nontender bowel sounds present. Musculoskeletal: No edema. Skin: No rash. Neurologic: Alert awake oriented to time place and person.  Moves all extremities. Psychiatric: Appears normal.  Normal affect.   Labs on Admission: I have personally reviewed following labs and imaging studies  CBC: Recent Labs  Lab 10/30/19 1205 11/04/19 1549  WBC 10.3 8.9  NEUTROABS  --  6.3  HGB 9.5* 10.0*  HCT 27.8* 29.3*  MCV 88.3 86.9  PLT 166 595   Basic Metabolic Panel: Recent Labs  Lab 10/30/19 1205 11/04/19 1549  NA 143 136  K 3.0* 2.9*  CL 104 95*  CO2 26 28  GLUCOSE 101* 113*  BUN 9 9  CREATININE 0.71 0.80  CALCIUM 9.7 9.5  MG  --  1.6*   GFR: Estimated Creatinine Clearance: 84.7 mL/min (by C-G formula based on SCr of 0.8 mg/dL). Liver Function  Tests: Recent Labs  Lab 10/30/19 1205 11/04/19 1549  AST 31 31  ALT 36 33  ALKPHOS 142* 111  BILITOT 0.8 0.5  PROT 7.6 7.5  ALBUMIN 3.7 3.5   Recent Labs  Lab 11/04/19 1549  LIPASE 19   No results for input(s): AMMONIA in the last 168 hours. Coagulation Profile: Recent Labs  Lab 10/30/19 1205  INR 1.2   Cardiac Enzymes: No results for input(s): CKTOTAL, CKMB, CKMBINDEX, TROPONINI in the last 168 hours. BNP (last 3 results) No results for input(s): PROBNP in the last 8760 hours. HbA1C: No results for input(s): HGBA1C in the last 72 hours. CBG: No results for input(s): GLUCAP in the last 168 hours. Lipid Profile: No results for input(s): CHOL, HDL, LDLCALC, TRIG, CHOLHDL, LDLDIRECT in the last 72 hours. Thyroid Function Tests: No results for input(s): TSH, T4TOTAL, FREET4, T3FREE, THYROIDAB in the last 72 hours. Anemia Panel: No results for input(s): VITAMINB12, FOLATE, FERRITIN, TIBC, IRON, RETICCTPCT in the last 72 hours. Urine analysis:    Component Value Date/Time   COLORURINE YELLOW 10/28/2019 2143   APPEARANCEUR CLEAR 10/28/2019 2143   LABSPEC 1.009 10/28/2019 2143   PHURINE 6.0 10/28/2019 2143   GLUCOSEU NEGATIVE 10/28/2019 2143   HGBUR NEGATIVE 10/28/2019 2143   [place] NEGATIVE 10/28/2019 2143   KETONESUR 5 (A) 10/28/2019 2143   PROTEINUR NEGATIVE 10/28/2019 2143   NITRITE NEGATIVE 10/28/2019 2143   LEUKOCYTESUR LARGE (A) 10/28/2019 2143   Sepsis Labs: @LABRCNTIP (procalcitonin:4,lacticidven:4) ) Recent Results (from the past 240 hour(s))  Blood culture (routine x 2)     Status: None   Collection Time: 10/28/19 10:13 PM   Specimen: BLOOD  Result Value Ref Range Status   Specimen Description   Final    BLOOD LEFT ANTECUBITAL Performed at [facility], [place] [address] [place], [place] [address]    Special Requests   Final    BOTTLES DRAWN AEROBIC AND ANAEROBIC Blood Culture adequate volume Performed at [place] [address] [place], [place] [address]    Culture   Final    NO GROWTH 5 DAYS Performed at [facility], [place] [address] [place], [place] [address]    Report Status 11/03/2019 FINAL  Final  Blood culture (routine x 2)     Status: None   Collection Time: 10/28/19 10:14 PM   Specimen: BLOOD  Result Value Ref Range Status   Specimen Description   Final    BLOOD PORTA CATH Performed at [place] [address] [place], [place] [address]    Special Requests   Final    BOTTLES DRAWN AEROBIC AND ANAEROBIC Blood Culture adequate volume Performed at [place] [address], [place], [place] [address]    Culture   Final    NO GROWTH 5 DAYS Performed at [facility], [place] [address] [place],  [address]    Report Status 11/03/2019 FINAL  Final     Radiological Exams on Admission: CT ABDOMEN PELVIS W CONTRAST  Result Date: 11/04/2019 CLINICAL DATA:  Nausea and vomiting, history of pancreatic cancer EXAM: CT ABDOMEN AND PELVIS WITH CONTRAST TECHNIQUE: Multidetector CT imaging of the abdomen and pelvis was performed using the standard protocol following bolus administration of intravenous contrast. CONTRAST:  123mL OMNIPAQUE IOHEXOL 300 MG/ML  SOLN COMPARISON:  September 19, 2019. FINDINGS:  Lower chest: No acute abnormality.  LEFT basilar atelectasis. Hepatobiliary: There are innumerable hypodense masses throughout the liver, increased in conspicuity in comparison to prior study. Representative mass of the hepatic dome measures 16 mm, previously 7 mm (series 2, image 8). Newly conspicuous mass of the RIGHT inferior liver measures 12 mm (series 2, image 28). Gallbladder is unremarkable. There is mild central biliary ductal prominence. Pancreas: Ill-defined pancreatic mass of the pancreatic head measures approximately 3.2 x 1.9 by 3.3 cm, similar to mildly increased in comparison to prior. There is upstream pancreatic ductal  dilation, unchanged in comparison to prior. This mass directly abuts the duodenum. There is adjacent fat stranding along the head of the pancreas, similar in comparison to prior. Spleen: Unremarkable. Adrenals/Urinary Tract: Adrenals are unremarkable. Kidneys enhance symmetrically. No hydronephrosis. Bladder is unremarkable. Stomach/Bowel: No evidence of bowel obstruction. There is circumferential bowel wall thickening with adjacent fat stranding throughout the colon. This is most predominant in the transverse colon. Appendix is unremarkable. Vascular/Lymphatic: Peripancreatic lymph node measures 9 mm, unchanged (series 2, image 28). There is a filling defect in the LEFT internal iliac vein. Minimal atherosclerotic calcifications. Aortic branches are patent. Reproductive: Status post hysterectomy. Other: No free air. Musculoskeletal: No acute or significant osseous findings. IMPRESSION: 1. Circumferential bowel wall thickening with adjacent fat stranding throughout the colon, most predominant in the transverse colon. This is consistent with colitis, which may be infectious or inflammatory in etiology. 2. Ill-defined pancreatic head mass consistent with known malignancy, similar to mildly increased in comparison to prior CT. There are innumerable hypodense masses throughout the liver, increased in conspicuity in comparison to prior CT. Findings are worrisome for worsening metastatic disease. 3. Filling defect in the LEFT internal iliac vein, likely a thrombus with differential considerations including mixing artifact. Electronically Signed   By: [name] MD   On: 11/04/2019 19:10   DG Chest Port 1 View  Result Date: 11/04/2019 CLINICAL DATA:  Nausea, vomiting and diarrhea since chemotherapy 2 weeks prior, history of pancreatic cancer EXAM: PORTABLE CHEST 1 VIEW COMPARISON:  Radiograph 10/28/2019 FINDINGS: Accessed left subclavian approach Port-A-Cath tip terminates at the superior cavoatrial junction  in similar position to prior. Telemetry leads overlie the chest. Stable streaky opacities in the left lung base likely reflecting scarring or subsegmental atelectasis, less likely infection. No new consolidative opacity. No pneumothorax or effusion. The cardiomediastinal contours are unremarkable. No acute osseous or soft tissue abnormality. IMPRESSION: Stable streaky opacities in the left lung base likely reflecting scarring or subsegmental atelectasis, less likely infection. No new acute cardiopulmonary abnormality. Electronically Signed   By: Lovena Le M.D.   On: 11/04/2019 20:59    EKG: Independently reviewed.  Sinus tachycardia.  Assessment/Plan Principal Problem:   Colitis Active Problems:   Essential hypertension   Lupus (systemic lupus erythematosus) (HCC)   Pancreatic cancer (HCC)   Acute deep vein thrombosis of left iliac vein (HCC)   Hypokalemia    1. Intractable nausea vomiting diarrhea with abdominal cramps CT scan showing pancolitis for which stool for C. difficile has been ordered and patient has been empirically started on antibiotics.  Will check lactic acid with next blood draw.  Continue with aggressive hydration.  Differentials include either infectious or could be chemotherapy related.  Will check lactic acid to make sure there is no ischemic component. 2. Left iliac vein DVT was started on Lovenox after discussing with on-call oncologist. 3. Severe hypokalemia and hypomagnesemia likely from vomiting and diarrhea replace and recheck. 4. Tachycardia  from patient missing her medications particularly metoprolol which I have dosed IV until patient can take orally. 5. Hypertension presently on IV metoprolol and as needed IV hydralazine until patient can tolerate oral medications. 6. Pancreatic cancer being followed by Dr. Burr Medico oncologist -please notify Dr. Burr Medico in the morning. 7. History of lupus presently on methotrexate does take hydroxychloroquine. 8. Anemia likely  related to chemotherapy and also possibly related to lupus.  Since patient has severe electrolyte derangement with persistent nausea vomiting still ongoing with pancolitis and iliac vein DVT will need close monitoring for any further worsening in inpatient status.   DVT prophylaxis: Lovenox full dose for DVT. Code Status: Full code. Family Communication: Discussed with patient. Disposition Plan: Home. Consults called: ER physician discussed with oncologist. Admission status: Inpatient.   Rise Patience MD [facility] Pager [phone].  If 7PM-7AM, please contact night-coverage [email or web address] Password [email or web address]  11/04/2019, 9:25 PM

## 2019-11-04 NOTE — ED Triage Notes (Signed)
Pt arrived via walk in, c/o n/v diarrhea since chemo tx x2 weeks ago.

## 2019-11-04 NOTE — ED Notes (Signed)
Failed attempt to obtain cultures in left [facility]

## 2019-11-04 NOTE — ED Provider Notes (Signed)
[facility] DEPT Provider Note   CSN: [id number] Arrival date & time: 11/04/19  1433     History Chief Complaint  Patient presents with  . Emesis    Cynthia Frye is a 59 y.o. female.  HPI      Cynthia Frye is a 59 y.o. female, with a history of asthma, pancreatic cancer, HTN, lupus, presenting to the ED with nausea, vomiting, and diarrhea for the past two weeks since her first chemo therapy treatment Sept 29 & 30. Patient was diagnosed with pancreatic cancer in August and has been under the care of Dr. Burr Medico.  She has been taking Compazine for nausea without improvement.  Denies fever/chills, syncope, chest pain, shortness of breath, cough, acute abdominal pain, hematochezia/melena, urinary symptoms, or any other complaints.  Past Medical History:  Diagnosis Date  . Allergies    peanuts, corn, beans, red grapefruit, naval oranges  . Asthma    allergy shots and medication  . Cancer [facility])    pancreatic  . Collagen vascular disease ([place])   . DDD (degenerative disc disease), lumbar   . Eustachian tube dysfunction 12/2012   rhinitis, vertigo- Dr. Wilburn Cornelia, ENT   . Fibroids   . Heart murmur    Echo 1/18: EF 55-60, no RWMA, normal diastolic function, trivial AI, PASP 32  . History of cardiac catheterization    LHC 3/18: normal coronary arteries.   . History of nuclear stress test    ETT-Myoview 2/18: EF 62, + ECG response; apical and distal septal ischemia; intermediate risk.  Marland Kitchen Hypertension   . Iron deficiency anemia   . Lupus [facility]) 2011   Dr. Trudie Reed  . Migraine headache    trial of generic maxalt 10 mg, January 2021  . Obesity   . Seasonal allergies   . Seizure in childhood [facility])    as a child no treatment none x 30 years    Patient Active Problem List   Diagnosis Date Noted  . Colitis 11/04/2019  . Acute deep vein thrombosis of left iliac vein (HCC) 11/04/2019  . Hypokalemia 11/04/2019  . Port-A-Cath in place 10/24/2019    . Goals of care, counseling/discussion 10/18/2019  . Pancreatic cancer ([place]) 10/02/2019  . Chronic migraine without aura without status migrainosus, not intractable 04/23/2019  . Migraine with aura and without status migrainosus, not intractable 04/23/2019  . Essential hypertension 04/29/2016  . Lupus (systemic lupus erythematosus) ([place]) 04/29/2016  . Asthma 04/29/2016  . Seasonal allergies 04/29/2016  . Abnormal stress test 04/09/2016    Past Surgical History:  Procedure Laterality Date  . ABDOMINAL HYSTERECTOMY    . BACK SURGERY  2009  . COLONOSCOPY WITH PROPOFOL N/A 04/11/2012   Procedure: COLONOSCOPY WITH PROPOFOL;  Surgeon: Garlan Fair, MD;  Location: [facility];  Service: Endoscopy;  Laterality: N/A;  . HERNIA REPAIR     as child unbilical  . U2-P partial discectomy and laminectomy     Dr. Christella Noa  . LEFT HEART CATH AND CORONARY ANGIOGRAPHY N/A 04/09/2016   Procedure: Left Heart Cath and Coronary Angiography;  Surgeon: Nelva Bush, MD;  Location: [place] CV LAB;  Service: Cardiovascular;  Laterality: N/A;  . MOUTH SURGERY    . PORTACATH PLACEMENT N/A 10/11/2019   Procedure: INSERTION PORT-A-CATH LEFT SUBCLAVIAN;  Surgeon: [name], MD;  Location: [place];  Service: General;  Laterality: N/A;     OB History   No obstetric history on file.     Family History  Problem Relation Age of Onset  . Asthma Mother   . Diabetes Mother   . Hypertension Mother   . Heart attack Father 31  . CAD Father   . Stroke Maternal Aunt   . Stroke Paternal Aunt   . Stroke Paternal Uncle   . Gout Other        uncles   . Cancer Niece        paternal half sister's daughter; unknown type; unknown age diagnosed  . Ovarian cancer Neg Hx   . Breast cancer Neg Hx   . Colon cancer Neg Hx   . Migraines Neg Hx     Social History   Tobacco Use  . Smoking status: Never Smoker  . Smokeless tobacco: Never Used  Vaping Use  . Vaping Use: Never used   Substance Use Topics  . Alcohol use: No  . Drug use: No    Home Medications Prior to Admission medications   Medication Sig Start Date End Date Taking? Authorizing Provider  acetaminophen (TYLENOL) 500 MG tablet Take 1,000 mg by mouth every 6 (six) hours as needed for moderate pain.   Yes [provider]  albuterol (PROVENTIL HFA;VENTOLIN HFA) 108 (90 Base) MCG/ACT inhaler Inhale 2 puffs into the lungs every 6 (six) hours as needed for wheezing or shortness of breath. 04/29/16  Yes Harrison Mons, PA  aspirin 81 MG tablet Take 81 mg by mouth daily.   Yes [provider]  Bepotastine Besilate (BEPREVE) 1.5 % SOLN Place 1 drop into both eyes 2 (two) times daily as needed (DRY EYES).   Yes [provider]  cephALEXin (KEFLEX) 500 MG capsule Take 1 capsule (500 mg total) by mouth 3 (three) times daily. 10/30/19  Yes Alla Feeling, NP  Cholecalciferol (VITAMIN D) 2000 units CAPS Take 2,000 Units by mouth daily.    Yes [provider]  diclofenac (VOLTAREN) 75 MG EC tablet Take 75 mg by mouth 2 (two) times daily. 10/17/19  Yes [provider]  Dietary Management Product (RHEUMATE) CAPS Take 1 capsule by mouth daily.    Yes [provider]  DIPROLENE 0.05 % ointment Apply 1 application topically as needed (skin irritation).  04/08/15  Yes [provider]  esomeprazole (NEXIUM) 10 MG packet Take 10 mg by mouth daily as needed (heartburn).    Yes [provider]  ferrous sulfate 325 (65 FE) MG tablet Take 325 mg by mouth daily with breakfast.   Yes [provider]  fluticasone (FLONASE) 50 MCG/ACT nasal spray Place 2 sprays into both nostrils daily. 04/29/16  Yes Jeffery, Domingo Mend, PA  hydroxychloroquine (PLAQUENIL) 200 MG tablet Take 400 mg by mouth daily.  06/17/14  Yes [provider]  lidocaine-prilocaine (EMLA) cream Apply to affected area once Patient taking differently: Apply 1 application topically once.   10/18/19  Yes Truitt Merle, MD  loratadine (CLARITIN) 10 MG tablet Take 10 mg by mouth daily.   Yes [provider]  losartan-hydrochlorothiazide (HYZAAR) 50-12.5 MG tablet Take 1 tablet by mouth daily.    Yes [provider]  metoprolol succinate (TOPROL-XL) 50 MG 24 hr tablet Take 50 mg by mouth daily.    Yes [provider]  mirtazapine (REMERON) 15 MG tablet Take 1 tablet (15 mg total) by mouth at bedtime. 10/25/19  Yes Alla Feeling, NP  ondansetron (ZOFRAN) 8 MG tablet Take 1 tablet (8 mg total) by mouth 2 (two) times daily as needed. Start on day 3 after chemotherapy.  10/18/19  Yes Truitt Merle, MD  oxyCODONE (OXY IR/ROXICODONE) 5 MG immediate release tablet Take 1 tablet (5 mg total) by mouth every 6 (six) hours as needed for severe pain. 10/11/19  Yes [name], MD  potassium chloride SA (KLOR-CON) 20 MEQ tablet Take 1 tablet (20 mEq total) by mouth 2 (two) times daily. 10/24/19  Yes Alla Feeling, NP  prochlorperazine (COMPAZINE) 10 MG tablet Take 1 tablet (10 mg total) by mouth every 6 (six) hours as needed (Nausea or vomiting). Patient taking differently: Take 10 mg by mouth every 6 (six) hours as needed for nausea or vomiting.  10/18/19  Yes Truitt Merle, MD  rizatriptan (MAXALT-MLT) 10 MG disintegrating tablet Take 1 tablet (10 mg total) by mouth as needed for migraine. May repeat in 2 hours if needed Patient taking differently: Take 10 mg by mouth daily as needed for migraine. May repeat in 2 hours if needed 04/19/19  Yes Melvenia Beam, MD  topiramate (TOPAMAX) 50 MG tablet Take 75mg  to 100mg  (1.5 - 2 tablets) daily at bedtime for headaches Patient taking differently: Take 50 mg by mouth at bedtime.  07/24/19  Yes Lomax, Amy, NP  ALPRAZolam (XANAX) 0.25 MG tablet Take 1 tablet (0.25 mg total) by mouth at bedtime as needed for anxiety. 10/18/19   Truitt Merle, MD  cetirizine (ZYRTEC) 10 MG chewable tablet Chew 10 mg by mouth daily.    [provider]   dexamethasone (DECADRON) 4 MG tablet Take 1 tablet (4 mg total) by mouth daily. Take 1 tablet once daily for 3 to 5 days after chemo 10/29/19   Alla Feeling, NP  methotrexate 50 MG/2ML injection Inject 25 mg into the skin once a week. 47mL = 25mg  06/17/14   [provider]    Allergies    Cinnamon, Peanut-containing drug products, Prednisone, Corn oil, Corn-containing products, and Other  Review of Systems   Review of Systems  Constitutional: Negative for chills, diaphoresis and fever.  Respiratory: Negative for cough and shortness of breath.   Cardiovascular: Negative for chest pain.  Gastrointestinal: Positive for diarrhea, nausea and vomiting. Negative for abdominal pain and blood in stool.  Genitourinary: Negative for dysuria and hematuria.  Neurological: Negative for dizziness, syncope, weakness, numbness and headaches.  All other systems reviewed and are negative.   Physical Exam Updated Vital Signs BP (!) 146/97 (BP Location: Right Arm)   Pulse (!) 145   Temp 98.2 F (36.8 C) (Oral)   Resp 20   SpO2 100%   Physical Exam Vitals and nursing note reviewed.  Constitutional:      General: She is not in acute distress.    Appearance: She is well-developed. She is not diaphoretic.  HENT:     Head: Normocephalic and atraumatic.     Mouth/Throat:     Mouth: Mucous membranes are moist.     Pharynx: Oropharynx is clear.  Eyes:     Conjunctiva/sclera: Conjunctivae normal.  Cardiovascular:     Rate and Rhythm: Regular rhythm. Tachycardia present.     Pulses: Normal pulses.          Radial pulses are 2+ on the right side and 2+ on the left side.       Posterior tibial pulses are 2+ on the right side and 2+ on the left side.     Heart sounds: Normal heart sounds.     Comments: Tactile temperature in the extremities appropriate and equal bilaterally. Pulmonary:     Effort:  Pulmonary effort is normal. No respiratory distress.     Breath sounds: Normal breath sounds.   Abdominal:     Palpations: Abdomen is soft.     Tenderness: There is abdominal tenderness in the epigastric area. There is no guarding.     Comments: States her epigastric tenderness is baseline for her.  Musculoskeletal:     Cervical back: Neck supple.     Right lower leg: No edema.     Left lower leg: No edema.     Comments: No lower extremity edema, pain, erythema, tenderness, or increased warmth.  Lymphadenopathy:     Cervical: No cervical adenopathy.  Skin:    General: Skin is warm and dry.  Neurological:     Mental Status: She is alert.  Psychiatric:        Mood and Affect: Mood and affect normal.        Speech: Speech normal.        Behavior: Behavior normal.     ED Results / Procedures / Treatments   Labs (all labs ordered are listed, but only abnormal results are displayed) Labs Reviewed  COMPREHENSIVE METABOLIC PANEL - Abnormal; Notable for the following components:      Result Value   Potassium 2.9 (*)    Chloride 95 (*)    Glucose, Bld 113 (*)    All other components within normal limits  CBC WITH DIFFERENTIAL/PLATELET - Abnormal; Notable for the following components:   RBC 3.37 (*)    Hemoglobin 10.0 (*)    HCT 29.3 (*)    Monocytes Absolute 1.2 (*)    Abs Immature Granulocytes 0.10 (*)    All other components within normal limits  MAGNESIUM - Abnormal; Notable for the following components:   Magnesium 1.6 (*)    All other components within normal limits  GASTROINTESTINAL PANEL BY PCR, STOOL (REPLACES STOOL CULTURE)  C DIFFICILE QUICK SCREEN W PCR REFLEX  RESPIRATORY PANEL BY RT PCR (FLU A&B, COVID)  CULTURE, BLOOD (ROUTINE X 2)  CULTURE, BLOOD (ROUTINE X 2)  LIPASE, BLOOD  HIV ANTIBODY (ROUTINE TESTING W REFLEX)  COMPREHENSIVE METABOLIC PANEL  CBC WITH DIFFERENTIAL/PLATELET  TSH   Hemoglobin  Date Value Ref Range Status  11/04/2019 10.0 (L) 12.0 - 15.0 g/dL Final  10/30/2019 9.5 (L) 12.0 - 15.0 g/dL Final  10/28/2019 9.9 (L) 12.0 - 15.0 g/dL  Final  10/25/2019 9.7 (L) 12.0 - 15.0 g/dL Final  10/24/2019 9.9 (L) 12.0 - 15.0 g/dL Final  04/22/2019 13.2 12.0 - 15.0 g/dL Final  04/02/2016 12.5 11.1 - 15.9 g/dL Final      EKG EKG Interpretation  Date/Time:  Sunday November 04 2019 15:24:47 EDT Ventricular Rate:  133 PR Interval:    QRS Duration: 85 QT Interval:  329 QTC Calculation: 490 R Axis:   68 Text Interpretation: Sinus tachycardia Repol abnrm suggests ischemia, anterolateral Confirmed by Lennice Sites [phone]) on 11/04/2019 3:48:41 PM   Radiology CT ABDOMEN PELVIS W CONTRAST  Result Date: 11/04/2019 CLINICAL DATA:  Nausea and vomiting, history of pancreatic cancer EXAM: CT ABDOMEN AND PELVIS WITH CONTRAST TECHNIQUE: Multidetector CT imaging of the abdomen and pelvis was performed using the standard protocol following bolus administration of intravenous contrast. CONTRAST:  16mL OMNIPAQUE IOHEXOL 300 MG/ML  SOLN COMPARISON:  September 19, 2019. FINDINGS: Lower chest: No acute abnormality.  LEFT basilar atelectasis. Hepatobiliary: There are innumerable hypodense masses throughout the liver, increased in conspicuity in comparison to prior study. Representative mass of the hepatic dome measures  16 mm, previously 7 mm (series 2, image 8). Newly conspicuous mass of the RIGHT inferior liver measures 12 mm (series 2, image 28). Gallbladder is unremarkable. There is mild central biliary ductal prominence. Pancreas: Ill-defined pancreatic mass of the pancreatic head measures approximately 3.2 x 1.9 by 3.3 cm, similar to mildly increased in comparison to prior. There is upstream pancreatic ductal dilation, unchanged in comparison to prior. This mass directly abuts the duodenum. There is adjacent fat stranding along the head of the pancreas, similar in comparison to prior. Spleen: Unremarkable. Adrenals/Urinary Tract: Adrenals are unremarkable. Kidneys enhance symmetrically. No hydronephrosis. Bladder is unremarkable. Stomach/Bowel: No  evidence of bowel obstruction. There is circumferential bowel wall thickening with adjacent fat stranding throughout the colon. This is most predominant in the transverse colon. Appendix is unremarkable. Vascular/Lymphatic: Peripancreatic lymph node measures 9 mm, unchanged (series 2, image 28). There is a filling defect in the LEFT internal iliac vein. Minimal atherosclerotic calcifications. Aortic branches are patent. Reproductive: Status post hysterectomy. Other: No free air. Musculoskeletal: No acute or significant osseous findings. IMPRESSION: 1. Circumferential bowel wall thickening with adjacent fat stranding throughout the colon, most predominant in the transverse colon. This is consistent with colitis, which may be infectious or inflammatory in etiology. 2. Ill-defined pancreatic head mass consistent with known malignancy, similar to mildly increased in comparison to prior CT. There are innumerable hypodense masses throughout the liver, increased in conspicuity in comparison to prior CT. Findings are worrisome for worsening metastatic disease. 3. Filling defect in the LEFT internal iliac vein, likely a thrombus with differential considerations including mixing artifact. Electronically Signed   By: [name] MD   On: 11/04/2019 19:10   DG Chest Port 1 View  Result Date: 11/04/2019 CLINICAL DATA:  Nausea, vomiting and diarrhea since chemotherapy 2 weeks prior, history of pancreatic cancer EXAM: PORTABLE CHEST 1 VIEW COMPARISON:  Radiograph 10/28/2019 FINDINGS: Accessed left subclavian approach Port-A-Cath tip terminates at the superior cavoatrial junction in similar position to prior. Telemetry leads overlie the chest. Stable streaky opacities in the left lung base likely reflecting scarring or subsegmental atelectasis, less likely infection. No new consolidative opacity. No pneumothorax or effusion. The cardiomediastinal contours are unremarkable. No acute osseous or soft tissue abnormality.  IMPRESSION: Stable streaky opacities in the left lung base likely reflecting scarring or subsegmental atelectasis, less likely infection. No new acute cardiopulmonary abnormality. Electronically Signed   By: Lovena Le M.D.   On: 11/04/2019 20:59    Procedures Procedures (including critical care time)  Medications Ordered in ED Medications  potassium chloride 10 mEq in 100 mL IVPB (0 mEq Intravenous Stopped 11/04/19 2057)    Followed by  potassium chloride 10 mEq in 100 mL IVPB (10 mEq Intravenous New Bag/Given 11/04/19 2059)  sodium chloride (PF) 0.9 % injection (has no administration in time range)  cefTRIAXone (ROCEPHIN) 2 g in sodium chloride 0.9 % 100 mL IVPB (2 g Intravenous New Bag/Given 11/04/19 2145)  enoxaparin (LOVENOX) injection 80 mg (has no administration in time range)  aspirin tablet 81 mg (has no administration in time range)  oxyCODONE (Oxy IR/ROXICODONE) immediate release tablet 5 mg (has no administration in time range)  hydroxychloroquine (PLAQUENIL) tablet 400 mg (has no administration in time range)  topiramate (TOPAMAX) tablet 50 mg (has no administration in time range)  0.9 % NaCl with KCl 20 mEq/ L  infusion (has no administration in time range)  acetaminophen (TYLENOL) tablet 650 mg (has no administration in time range)    Or  acetaminophen (TYLENOL) suppository 650 mg (has no administration in time range)  metoprolol tartrate (LOPRESSOR) injection 5 mg (has no administration in time range)  cefTRIAXone (ROCEPHIN) 2 g in sodium chloride 0.9 % 100 mL IVPB (has no administration in time range)  metroNIDAZOLE (FLAGYL) IVPB 500 mg (has no administration in time range)  labetalol (NORMODYNE) injection 10 mg (has no administration in time range)  lactated ringers bolus 1,000 mL (0 mLs Intravenous Stopped 11/04/19 1750)  ondansetron (ZOFRAN) injection 4 mg (4 mg Intravenous Given 11/04/19 1547)  magnesium sulfate IVPB 2 g 50 mL (0 g Intravenous Stopped 11/04/19 1929)   lactated ringers bolus 1,000 mL (0 mLs Intravenous Stopped 11/04/19 1929)  iohexol (OMNIPAQUE) 300 MG/ML solution 100 mL (100 mLs Intravenous Contrast Given 11/04/19 1800)  metroNIDAZOLE (FLAGYL) IVPB 500 mg (500 mg Intravenous New Bag/Given 11/04/19 2033)    ED Course  I have reviewed the triage vital signs and the nursing notes.  Pertinent labs & imaging results that were available during my care of the patient were reviewed by me and considered in my medical decision making (see chart for details).  Clinical Course as of Nov 03 2144  Nancy Fetter Nov 04, 2019  1707 3.0 on Oct 5 and 2.8 on Oct 3.  Potassium(!): 2.9 [SJ]  1724 Pulse rate 122. Patient voices improvement in nausea.   [SJ]  [id number] Spoke with Dr. Irene Limbo, oncology.  We discussed the patient's cancer diagnosis, chemotherapy timing, and presenting symptoms today. He states the diarrhea can be a symptom of her chemotherapy treatment.  Nausea and vomiting can also be from the chemotherapy, however, this far out from the treatment her nausea and vomiting may be symptoms of her cancer. If she has any abdominal tenderness, it would not be unreasonable to obtain repeat CT scan.   [SJ]  1952 Spoke with Dr. Irene Limbo to update him on CT findings. Admit patient via hospitalist. Recommend starting antibiotics, Flagyl and Cipro, if possible. Due to the possibility of thrombus in the left internal iliac vein, recommend starting anticoagulation, heparin or Lovenox, though Lovenox is preferred. Put in GI panel and C. difficile testing.   [SJ]  67 Spoke with Altha Harm, pharmacist.  We discussed the warning message I was receiving from trying to put in order for Cipro, due to the patient's listed allergy to corn products. She would recommend 2 g Rocephin instead. Recommends lovenox for treatment of possible VTE.   [SJ]  2006 Discussed the CT findings with the patient and her husband as well as the proposed realized plan of care for treatment and  admission. She continues to deny shortness of breath, chest pain, leg pain, or other such symptoms.   [SJ]  2035 Spoke with Dr. Hal Hope, hospitalist, agrees to admit the patient. Requests a lactic acid.   [SJ]    Clinical Course User Index [SJ] Davinder Haff, Helane Gunther, PA-C   MDM Rules/Calculators/A&P                          Patient presents with nausea, vomiting, and diarrhea. Patient is nontoxic appearing, afebrile, not tachypneic, not hypotensive, maintains excellent SPO2 on room air, and is in no apparent distress.  Persistently tachycardic.  No chest pain, shortness of breath, or other chest symptoms.  I have reviewed the patient's chart to obtain more information.   I reviewed and interpreted the patient's labs and radiological studies. Hypokalemia, hypomagnesemia, both addressed. CT with evidence of pancolitis.  Question of worsening  metastases. Question of left internal iliac vein thrombus. Patient admitted for further management.   Findings and plan of care discussed with Lennice Sites, DO.   Final Clinical Impression(s) / ED Diagnoses Final diagnoses:  Nausea vomiting and diarrhea  Pancolitis [facility])    Rx / DC Orders ED Discharge Orders    None       Layla Maw 11/04/19 2152    Lennice Sites, DO 11/04/19 2212

## 2019-11-05 ENCOUNTER — Inpatient Hospital Stay: Payer: BC Managed Care – PPO

## 2019-11-05 ENCOUNTER — Inpatient Hospital Stay (HOSPITAL_COMMUNITY): Payer: BC Managed Care – PPO

## 2019-11-05 DIAGNOSIS — K529 Noninfective gastroenteritis and colitis, unspecified: Secondary | ICD-10-CM | POA: Diagnosis not present

## 2019-11-05 DIAGNOSIS — I82409 Acute embolism and thrombosis of unspecified deep veins of unspecified lower extremity: Secondary | ICD-10-CM | POA: Diagnosis not present

## 2019-11-05 DIAGNOSIS — C251 Malignant neoplasm of body of pancreas: Secondary | ICD-10-CM | POA: Diagnosis not present

## 2019-11-05 DIAGNOSIS — R634 Abnormal weight loss: Secondary | ICD-10-CM | POA: Diagnosis not present

## 2019-11-05 DIAGNOSIS — K838 Other specified diseases of biliary tract: Secondary | ICD-10-CM | POA: Diagnosis not present

## 2019-11-05 LAB — CBC WITH DIFFERENTIAL/PLATELET
Abs Immature Granulocytes: 0.09 10*3/uL — ABNORMAL HIGH (ref 0.00–0.07)
Basophils Absolute: 0 10*3/uL (ref 0.0–0.1)
Basophils Relative: 0 %
Eosinophils Absolute: 0.5 10*3/uL (ref 0.0–0.5)
Eosinophils Relative: 6 %
HCT: 24.5 % — ABNORMAL LOW (ref 36.0–46.0)
Hemoglobin: 8 g/dL — ABNORMAL LOW (ref 12.0–15.0)
Immature Granulocytes: 1 %
Lymphocytes Relative: 21 %
Lymphs Abs: 1.6 10*3/uL (ref 0.7–4.0)
MCH: 29.1 pg (ref 26.0–34.0)
MCHC: 32.7 g/dL (ref 30.0–36.0)
MCV: 89.1 fL (ref 80.0–100.0)
Monocytes Absolute: 1 10*3/uL (ref 0.1–1.0)
Monocytes Relative: 13 %
Neutro Abs: 4.4 10*3/uL (ref 1.7–7.7)
Neutrophils Relative %: 59 %
Platelets: 168 10*3/uL (ref 150–400)
RBC: 2.75 MIL/uL — ABNORMAL LOW (ref 3.87–5.11)
RDW: 13.1 % (ref 11.5–15.5)
WBC: 7.5 10*3/uL (ref 4.0–10.5)
nRBC: 0 % (ref 0.0–0.2)

## 2019-11-05 LAB — COMPREHENSIVE METABOLIC PANEL
ALT: 26 U/L (ref 0–44)
AST: 24 U/L (ref 15–41)
Albumin: 2.8 g/dL — ABNORMAL LOW (ref 3.5–5.0)
Alkaline Phosphatase: 81 U/L (ref 38–126)
Anion gap: 9 (ref 5–15)
BUN: 5 mg/dL — ABNORMAL LOW (ref 6–20)
CO2: 26 mmol/L (ref 22–32)
Calcium: 8.5 mg/dL — ABNORMAL LOW (ref 8.9–10.3)
Chloride: 99 mmol/L (ref 98–111)
Creatinine, Ser: 0.65 mg/dL (ref 0.44–1.00)
GFR, Estimated: >60 mL/min (ref >60)
Glucose, Bld: 101 mg/dL — ABNORMAL HIGH (ref 70–99)
Potassium: 3.2 mmol/L — ABNORMAL LOW (ref 3.5–5.1)
Sodium: 134 mmol/L — ABNORMAL LOW (ref 135–145)
Total Bilirubin: 0.4 mg/dL (ref 0.3–1.2)
Total Protein: 6 g/dL — ABNORMAL LOW (ref 6.5–8.1)

## 2019-11-05 LAB — C DIFFICILE QUICK SCREEN W PCR REFLEX
C Diff antigen: NEGATIVE
C Diff interpretation: NOT DETECTED
C Diff toxin: NEGATIVE

## 2019-11-05 LAB — HIV ANTIBODY (ROUTINE TESTING W REFLEX): HIV Screen 4th Generation wRfx: NONREACTIVE

## 2019-11-05 LAB — TSH: TSH: 2.545 u[IU]/mL (ref 0.350–4.500)

## 2019-11-05 LAB — MAGNESIUM: Magnesium: 2 mg/dL (ref 1.7–2.4)

## 2019-11-05 LAB — LACTIC ACID, PLASMA: Lactic Acid, Venous: 0.5 mmol/L (ref 0.5–1.9)

## 2019-11-05 LAB — PHOSPHORUS: Phosphorus: 2.1 mg/dL — ABNORMAL LOW (ref 2.5–4.6)

## 2019-11-05 MED ORDER — ONDANSETRON HCL 4 MG/2ML IJ SOLN
4.0000 mg | Freq: Four times a day (QID) | INTRAMUSCULAR | Status: DC | PRN
  Administered 2019-11-05: 4 mg via INTRAVENOUS
  Filled 2019-11-05: qty 2

## 2019-11-05 MED ORDER — ONDANSETRON HCL 4 MG/2ML IJ SOLN
INTRAMUSCULAR | Status: AC
  Administered 2019-11-05: 4 mg via INTRAVENOUS
  Filled 2019-11-05: qty 2

## 2019-11-05 MED ORDER — ENSURE ENLIVE PO LIQD
237.0000 mL | Freq: Two times a day (BID) | ORAL | Status: DC
  Administered 2019-11-05 (×2): 237 mL via ORAL

## 2019-11-05 MED ORDER — RHEUMATE PO CAPS: 1.0000 | ORAL_CAPSULE | Freq: Every day | ORAL | Status: DC

## 2019-11-05 MED ORDER — SODIUM CHLORIDE 0.9 % IV SOLN
15.0000 mmol | Freq: Once | INTRAVENOUS | Status: AC
  Administered 2019-11-05: 15 mmol via INTRAVENOUS
  Filled 2019-11-05: qty 5

## 2019-11-05 MED ORDER — BOOST / RESOURCE BREEZE PO LIQD CUSTOM
1.0000 | Freq: Three times a day (TID) | ORAL | Status: DC
  Administered 2019-11-05: 1 via ORAL

## 2019-11-05 MED ORDER — SODIUM CHLORIDE 0.9 % IV SOLN: Freq: Once | INTRAVENOUS | Status: DC

## 2019-11-05 MED ORDER — CHLORHEXIDINE GLUCONATE CLOTH 2 % EX PADS
6.0000 | MEDICATED_PAD | Freq: Every day | CUTANEOUS | Status: DC
  Administered 2019-11-05 – 2019-11-09 (×5): 6 via TOPICAL

## 2019-11-05 MED ORDER — MAGNESIUM SULFATE 2 GM/50ML IV SOLN
2.0000 g | Freq: Once | INTRAVENOUS | Status: AC
  Administered 2019-11-05: 2 g via INTRAVENOUS
  Filled 2019-11-05: qty 50

## 2019-11-05 MED ORDER — DICLOFENAC SODIUM 75 MG PO TBEC
75.0000 mg | DELAYED_RELEASE_TABLET | Freq: Two times a day (BID) | ORAL | Status: DC
  Administered 2019-11-05 – 2019-11-09 (×9): 75 mg via ORAL
  Filled 2019-11-05 (×10): qty 1

## 2019-11-05 MED ORDER — ONDANSETRON HCL 4 MG/2ML IJ SOLN: 4.0000 mg | Freq: Once | INTRAMUSCULAR | Status: AC

## 2019-11-05 MED ORDER — LOPERAMIDE HCL 2 MG PO CAPS
4.0000 mg | ORAL_CAPSULE | Freq: Two times a day (BID) | ORAL | Status: DC | PRN
  Administered 2019-11-05 – 2019-11-06 (×2): 4 mg via ORAL
  Filled 2019-11-05 (×3): qty 2

## 2019-11-05 MED ORDER — ONDANSETRON HCL 4 MG/2ML IJ SOLN
4.0000 mg | Freq: Four times a day (QID) | INTRAMUSCULAR | Status: AC
  Administered 2019-11-05 – 2019-11-07 (×8): 4 mg via INTRAVENOUS
  Filled 2019-11-05 (×8): qty 2

## 2019-11-05 MED ORDER — HYDRALAZINE HCL 20 MG/ML IJ SOLN: 10.0000 mg | Freq: Four times a day (QID) | INTRAMUSCULAR | Status: DC | PRN

## 2019-11-05 MED ORDER — POTASSIUM CHLORIDE 10 MEQ/100ML IV SOLN
10.0000 meq | INTRAVENOUS | Status: AC
  Administered 2019-11-05 (×3): 10 meq via INTRAVENOUS
  Filled 2019-11-05 (×3): qty 100

## 2019-11-05 NOTE — Progress Notes (Addendum)
PROGRESS NOTE  Cynthia Frye  DOB: 02-08-1960  PCP: Josetta Huddle, MD WNI:[id number]  DOA: 11/04/2019  LOS: 1 day   Chief Complaint  Patient presents with  . Emesis   Brief narrative: Cynthia Frye is a 59 y.o. female with PMH of pancreatic cancer recently started on chemotherapy 2 weeks ago, also with history of lupus, hypertension, chronic anemia Patient presented to the ED on 11/04/2019 with complaint of persistent nausea, vomiting, diarrhea, abdominal pain, poor oral intake, symptoms started after chemotherapy 2 weeks ago and have been progressively worsening.  Patient had presented to the ED with similar complaints on 10/8 and was discharged home on antibiotics but she was unable to hold them down because of vomiting. Of note, 10/5, patient had iopsy of liver lesions for suspicion of metastasis.  In the ED, patient was afebrile, tachycardic 245, blood pressure 146/97, maintaining oxygen saturation on room air. Labs with normal WBC count, hemoglobin 10, platelets 194, potassium 2.9, creatinine 0.8, magnesium 1.6, lactic acid 0.5 Respiratory virus panel including influenza and Covid PCR negative.  CT abdomen and pelvis showed circumferential bowel wall thickening with adjacent fat stranding throughout the colon, most predominant in the transverse colon. Findings are consistent with colitis, which may be infectious or inflammatory in etiology. It also showed possible left internal iliac vein thrombosis.  Patient was admitted to hospitalist service for colitis and DVT.  Subjective: Patient was seen and examined this morning. Pleasant middle-aged African-American female.  Propped up in bed.  Remains nauseous.  Has not been able to eat or drink anything and hence no vomiting or diarrhea since admission. No fever.  Tachycardia improving in 90s this morning.  Blood pressure in low normal range Labs this morning with potassium improved but still low at 3.2, hemoglobin down to 8, WBC  remains normal  Assessment/Plan: Pancolitis -Presented with intractable nausea, vomiting, diarrhea, abdominal cramping progressively worsening for 2 weeks since chemotherapy. -WBC count normal, lactic acid level normal. -Suspect chemotherapy-induced diarrhea.  Not sure if she was given immunotherapy as well. -Currently on normal saline with potassium at 125 mill per hour.  Continue the same. -Because of immunocompromised status due to chemotherapy and lupus medicines, patient is getting empiric antibiotic coverage with IV Rocephin which I will continue for next 24 to 48 hours.   -Oncology consult. -Symptomatic management with IV Zofran, Imodium.  I will make IV Zofran scheduled for next 2 days.  SIRS Sinus tachycardia -Heart rate elevated up to 145 at presentation. It is not clear if patient symptoms have any infectious etiology. -Tachycardia is likely secondary to dehydration as well as beta-blocker withdrawal.  With IV fluids, tachycardia seems to be improving.  Continue to monitor.  Left iliac vein DVT  -CT abdomen and pelvis showed filling defect in the left internal iliac vein likely a thrombus.   -Admitting physician discussed with on-call oncologist and started the patient on full dose Lovenox.   Hypokalemia/hypomagnesemia/hypophosphatemia -Electrolyte levels as below.   -Given IV KCl 10 mEq x 3 today.  Continue potassium rider on IV fluid. -IV phosphorus replacement given as well. Recent Labs  Lab 10/30/19 1205 11/04/19 1549 11/05/19 0409  K 3.0* 2.9* 3.2*  MG  --  1.6* 2.0  PHOS  --   --  2.1*   Essential hypertension -Home medications include Toprol 50 mg daily, HCTZ 12.5 mg daily, losartan 50 mg daily, potassium supplement. -Hypertensive on presentation because she was not able to take oral pills -Currently on scheduled IV metoprolol.  Add IV hydralazine as needed.  Continue to hold oral pills.  Metastatic pancreatic cancer  -Follows with Dr. Burr Medico oncologist.  Last  chemotherapy 2 weeks ago. -10/5, CT head liver biopsy to rule out metastasis. -CT abdomen on admission also showed innumerable hypodense masses throughout the liver, increased in conspicuity worrisome for worsening metastatic disease.  History of lupus - presently on Plaquenil 400 mg daily and diclofenac.  She was on methotrexate subcu weekly previously which was held before chemotherapy was initiated.  Chronic anemia -Due to malignancy and lupus.  Stable hemoglobin.  Continue to monitor. Recent Labs    10/25/19 1224 10/25/19 1224 10/28/19 2115 10/28/19 2115 10/30/19 1205 10/30/19 1205 11/04/19 1549 11/05/19 0409  HGB 9.7*  --  9.9*  --  9.5*  --  10.0* 8.0*  MCV 86.8   < > 86.7   < > 88.3   < > 86.9 89.1   < > = values in this interval not displayed.    Home meds  Toprol 50 mg daily, HCTZ 12.5 mg daily, losartan 50 mg daily, potassium supplement Aspirin 81 mg daily, Nexium 10 mg daily, iron sulfate, Plaquenil 400 mg daily, methotrexate 25 mg subcu weekly Oxycodone every 6 hours as needed, Topamax 50 mg at bedtime, Maxalt as needed for migraine Xanax 0.25 mg at bedtime for anxiety   Mobility: Encourage ambulation Code Status:   Code Status: Full Code  Nutritional status: Body mass index is 28.74 kg/m.     Diet Order            Diet clear liquid Room service appropriate? Yes; Fluid consistency: Thin  Diet effective now                DVT prophylaxis: Lovenox full dose   Antimicrobials:  IV Rocephin Fluid: Normal saline with potassium 125 mL/h Consultants: Oncology Family Communication:  None at bedside  Status is: Inpatient  Remains inpatient appropriate because: IV antibiotics, IV fluids,   Dispo: The patient is from: Home              Anticipated d/c is to: Home              Anticipated d/c date is: 3 days              Patient currently is not medically stable to d/c.       Infusions:  . 0.9 % NaCl with KCl 20 mEq / L 125 mL/hr at 11/05/19  0705  . cefTRIAXone (ROCEPHIN)  IV    . metronidazole Stopped (11/05/19 0507)  . potassium chloride 10 mEq (11/05/19 1049)  . small volume/piggyback builder      Scheduled Meds: . aspirin EC  81 mg Oral Daily  . Chlorhexidine Gluconate Cloth  6 each Topical Daily  . diclofenac  75 mg Oral BID  . enoxaparin (LOVENOX) injection  80 mg Subcutaneous Q12H  . feeding supplement (ENSURE ENLIVE)  237 mL Oral BID BM  . hydroxychloroquine  400 mg Oral Daily  . metoprolol tartrate  5 mg Intravenous Q6H  . ondansetron (ZOFRAN) IV  4 mg Intravenous Q6H  . Rheumate  1 capsule Oral Daily  . topiramate  50 mg Oral QHS    Antimicrobials: Anti-infectives (From admission, onward)   Start     Dose/Rate Route Frequency Ordered Stop   11/05/19 2200  cefTRIAXone (ROCEPHIN) 2 g in sodium chloride 0.9 % 100 mL IVPB        2 g 200 mL/hr over  30 Minutes Intravenous Every 24 hours 11/04/19 2126     11/05/19 1000  hydroxychloroquine (PLAQUENIL) tablet 400 mg        400 mg Oral Daily 11/04/19 2124     11/05/19 0400  metroNIDAZOLE (FLAGYL) IVPB 500 mg        500 mg 100 mL/hr over 60 Minutes Intravenous Every 8 hours 11/04/19 2126     11/04/19 2015  cefTRIAXone (ROCEPHIN) 2 g in sodium chloride 0.9 % 100 mL IVPB        2 g 200 mL/hr over 30 Minutes Intravenous  Once 11/04/19 2003 11/04/19 2238   11/04/19 2000  metroNIDAZOLE (FLAGYL) IVPB 500 mg        500 mg 100 mL/hr over 60 Minutes Intravenous  Once 11/04/19 1958 11/04/19 2151      PRN meds: acetaminophen **OR** acetaminophen, hydrALAZINE, labetalol, loperamide, oxyCODONE   Objective: Vitals:   11/05/19 0851 11/05/19 0852  BP: 121/72 121/72  Pulse:  100  Resp:  19  Temp: 98.8 F (37.1 C) 98.8 F (37.1 C)  SpO2:  100%    Intake/Output Summary (Last 24 hours) at 11/05/2019 1109 Last data filed at 11/05/2019 0201 Gross per 24 hour  Intake 200 ml  Output -  Net 200 ml   Filed Weights   11/04/19 2033 11/05/19 0851  Weight: 82.6 kg 83.2  kg   Weight change:  Body mass index is 28.74 kg/m.   Physical Exam: General exam: Appears calm and comfortable.  Not in physical distress Skin: No rashes, lesions or ulcers. HEENT: Atraumatic, normocephalic, supple neck, no obvious bleeding Lungs: Clear to auscultation bilaterally CVS: Regular rate and rhythm, no murmur GI/Abd soft, mild diffuse tenderness, more on epigastrium, nondistended, bowel sound present CNS: Alert, awake, oriented x3 Psychiatry: Mood appropriate Extremities: No pedal edema, no calf tenderness  Data Review: I have personally reviewed the laboratory data and studies available.  Recent Labs  Lab 10/30/19 1205 11/04/19 1549 11/05/19 0409  WBC 10.3 8.9 7.5  NEUTROABS  --  6.3 4.4  HGB 9.5* 10.0* 8.0*  HCT 27.8* 29.3* 24.5*  MCV 88.3 86.9 89.1  PLT 166 194 168   Recent Labs  Lab 10/30/19 1205 11/04/19 1549 11/05/19 0409  NA 143 136 134*  K 3.0* 2.9* 3.2*  CL 104 95* 99  CO2 26 28 26   GLUCOSE 101* 113* 101*  BUN 9 9 5*  CREATININE 0.71 0.80 0.65  CALCIUM 9.7 9.5 8.5*  MG  --  1.6* 2.0  PHOS  --   --  2.1*    F/u labs ordered  Signed, Terrilee Croak, MD [facility] 11/05/2019

## 2019-11-05 NOTE — Progress Notes (Signed)
Left lower extremity venous duplex has been completed. Preliminary results can be found in CV Proc through chart review.  Results were given to the patient's nurse, Otila Kluver.  11/05/19 10:19 AM Carlos Levering RVT

## 2019-11-05 NOTE — Progress Notes (Signed)
PHARMACIST - PHYSICIAN ORDER COMMUNICATION  CONCERNING: P[employer]T Medication Policy on Herbal Medications  DESCRIPTION:  This patient's order for:  Rheumate  has been noted.  This product(s) is classified as an "herbal" or natural product. Due to a lack of definitive safety studies or FDA approval, nonstandard manufacturing practices, plus the potential risk of unknown drug-drug interactions while on inpatient medications, the Pharmacy and Therapeutics Committee does not permit the use of "herbal" or natural products of this type within [facility].   ACTION TAKEN: The pharmacy department is unable to verify this order at this time and your patient has been informed of this safety policy. Please reevaluate patient's clinical condition at discharge and address if the herbal or natural product(s) should be resumed at that time.  Dia Sitter, PharmD, BCPS 11/05/2019 11:17 AM

## 2019-11-06 DIAGNOSIS — K529 Noninfective gastroenteritis and colitis, unspecified: Secondary | ICD-10-CM

## 2019-11-06 LAB — CBC WITH DIFFERENTIAL/PLATELET
Abs Immature Granulocytes: 0.2 10*3/uL — ABNORMAL HIGH (ref 0.00–0.07)
Basophils Absolute: 0 10*3/uL (ref 0.0–0.1)
Basophils Relative: 0 %
Eosinophils Absolute: 0.7 10*3/uL — ABNORMAL HIGH (ref 0.0–0.5)
Eosinophils Relative: 10 %
HCT: 21.3 % — ABNORMAL LOW (ref 36.0–46.0)
Hemoglobin: 7 g/dL — ABNORMAL LOW (ref 12.0–15.0)
Immature Granulocytes: 3 %
Lymphocytes Relative: 17 %
Lymphs Abs: 1.2 10*3/uL (ref 0.7–4.0)
MCH: 29.5 pg (ref 26.0–34.0)
MCHC: 32.4 g/dL (ref 30.0–36.0)
MCV: 91 fL (ref 80.0–100.0)
Monocytes Absolute: 0.5 10*3/uL (ref 0.1–1.0)
Monocytes Relative: 8 %
Neutro Abs: 4.1 10*3/uL (ref 1.7–7.7)
Neutrophils Relative %: 62 %
Platelets: 181 10*3/uL (ref 150–400)
RBC: 2.34 MIL/uL — ABNORMAL LOW (ref 3.87–5.11)
RDW: 13.3 % (ref 11.5–15.5)
WBC: 6.7 10*3/uL (ref 4.0–10.5)
nRBC: 0 % (ref 0.0–0.2)

## 2019-11-06 LAB — BASIC METABOLIC PANEL
Anion gap: 10 (ref 5–15)
BUN: <5 mg/dL — ABNORMAL LOW (ref 6–20)
CO2: 24 mmol/L (ref 22–32)
Calcium: 8.6 mg/dL — ABNORMAL LOW (ref 8.9–10.3)
Chloride: 108 mmol/L (ref 98–111)
Creatinine, Ser: 0.73 mg/dL (ref 0.44–1.00)
GFR, Estimated: >60 mL/min (ref >60)
Glucose, Bld: 84 mg/dL (ref 70–99)
Potassium: 3.4 mmol/L — ABNORMAL LOW (ref 3.5–5.1)
Sodium: 142 mmol/L (ref 135–145)

## 2019-11-06 LAB — GASTROINTESTINAL PANEL BY PCR, STOOL (REPLACES STOOL CULTURE)

## 2019-11-06 LAB — PHOSPHORUS: Phosphorus: 2.6 mg/dL (ref 2.5–4.6)

## 2019-11-06 LAB — MAGNESIUM: Magnesium: 2 mg/dL (ref 1.7–2.4)

## 2019-11-06 MED ORDER — METOPROLOL TARTRATE 25 MG PO TABS
25.0000 mg | ORAL_TABLET | Freq: Two times a day (BID) | ORAL | Status: DC
  Administered 2019-11-07 – 2019-11-09 (×5): 25 mg via ORAL
  Filled 2019-11-06 (×5): qty 1

## 2019-11-06 MED ORDER — DIPHENOXYLATE-ATROPINE 2.5-0.025 MG PO TABS
2.0000 | ORAL_TABLET | Freq: Four times a day (QID) | ORAL | Status: DC | PRN
  Administered 2019-11-06 (×2): 2 via ORAL
  Filled 2019-11-06 (×2): qty 2

## 2019-11-06 MED ORDER — SODIUM CHLORIDE 0.9 % IV SOLN: INTRAVENOUS | Status: DC

## 2019-11-06 MED ORDER — POTASSIUM CHLORIDE CRYS ER 20 MEQ PO TBCR
40.0000 meq | EXTENDED_RELEASE_TABLET | Freq: Once | ORAL | Status: AC
  Administered 2019-11-06: 40 meq via ORAL
  Filled 2019-11-06: qty 2

## 2019-11-06 NOTE — Progress Notes (Incomplete)
[place]   [phone] [phone]   Clinic Follow up Note   Patient Care Team: Josetta Huddle, MD as PCP - General (Internal Medicine) Jonnie Finner, RN as Oncology Nurse Navigator Truitt Merle, MD as Consulting Physician (Oncology) Arta Silence, MD as Consulting Physician (Gastroenterology) [name], MD as Consulting Physician (General Surgery)  Date of Service:  11/06/2019  CHIEF COMPLAINT: f/u of Pancreatic Cancer   SUMMARY OF ONCOLOGIC HISTORY: Oncology History Overview Note  Cancer Staging Pancreatic cancer [facility]) Staging form: Exocrine Pancreas, AJCC 8th Edition - Clinical stage from 10/18/2019: Stage IV (cT3, cN1, cM1) - Signed by Truitt Merle, MD on 10/18/2019    Pancreatic cancer ([place])  09/19/2019 Imaging   CT AP w contrast 09/19/19  IMPRESSION: 1. Findings are highly concerning for probable primary pancreatic adenocarcinoma in the anterior aspect of the pancreatic head. Several prominent borderline enlarged lymph nodes are noted in the hepatoduodenal nodal station, and there are multiple indeterminate liver lesions which are highly concerning for probable hepatic metastases. Further evaluation with nonemergent abdominal MRI with and without IV gadolinium with MRCP is recommended in the near future to better evaluate these findings.   09/25/2019 Procedure   Upper EUS by Dr Paulita Fujita  IMPRESSION -There was no evidence of significant pathology in the left lobe of the liver.  -A few lymph nodes were visualized and measures in the peripancreatic region and porta hepata region.  -Hyperchoic material consistent with sludge was visualized endosonographically in the gallbladder.  -There was no sign of significant pathology in the common bile duct.  -A mass was identified in the pancreatic head. If biopsy results show adenocarcinoma, it would be staged T3N1Mx by endosonographic criteria. Fine needle aspiration performed.    09/25/2019  Initial Biopsy   FINAL MICROSCOPIC DIAGNOSIS: Fine needle aspirate, Pancreas;  MALIGNANT CELLS PRESENT CONSISTENT WITH ADENOCARCINOMA.    10/02/2019 Initial Diagnosis   Pancreatic cancer ([place])   10/11/2019 Procedure   PAC placement y Dr Barry Dienes    10/15/2019 Imaging   CT Chest  IMPRESSION: 1. Small anterior left pneumothorax with dependent atelectasis in the left lower lobe. 2. Increased number of bilateral axillary and subpectoral lymph nodes with mild lymphadenopathy in the left axilla. While this would be an atypical presentation for metastatic pancreatic cancer, this possibility is not excluded 3. Main duct dilatation in the pancreas, better assessed on abdomen CT 09/19/2019.   10/16/2019 Imaging   MRI Abdomen  IMPRESSION: 1. Substantially motion degraded scan. 2. Probable persistent small anterior left lung base pneumothorax, better seen on chest CT from 1 day prior. 3. Poorly marginated hypoenhancing 3.7 x 2.9 cm pancreatic head mass, which appears to invade the anterior peripancreatic fat, compatible with known pancreatic adenocarcinoma. Diffuse irregular dilatation of the main pancreatic duct in the pancreatic body and tail. Mild narrowing of the main portal vein by the mass. Abdominal vasculature remains patent and otherwise uninvolved. 4. Numerous (greater than 10) small liver masses scattered throughout the liver, largest 1.0 cm, which appear to demonstrate targetoid enhancement on the limited motion degraded postcontrast sequences, compatible with liver metastases. 5. Mild porta hepatis adenopathy, suspicious for metastatic disease.   10/18/2019 Cancer Staging   Staging form: Exocrine Pancreas, AJCC 8th Edition - Clinical stage from 10/18/2019: Stage IV (cT3, cN1, cM1) - Signed by Truitt Merle, MD on 10/18/2019   10/24/2019 -  Chemotherapy   First-line FOLFIRINOX q2weeks starting 10/24/19   11/04/2019 Imaging   CT AP  IMPRESSION: 1. Circumferential bowel wall  thickening with adjacent fat stranding throughout the colon, most predominant in the transverse colon. This is consistent with colitis, which may be infectious or inflammatory in etiology. 2. Ill-defined pancreatic head mass consistent with known malignancy, similar to mildly increased in comparison to prior CT. There are innumerable hypodense masses throughout the liver, increased in conspicuity in comparison to prior CT. Findings are worrisome for worsening metastatic disease. 3. Filling defect in the LEFT internal iliac vein, likely a thrombus with differential considerations including mixing artifact.      CURRENT THERAPY:  First-line FOLFIRINOX q2weeks starting 10/24/19 ***   INTERVAL HISTORY: *** Cynthia Frye is here for a follow up. She presents to the clinic alone.   REVIEW OF SYSTEMS:  *** Constitutional: Denies fevers, chills or abnormal weight loss Eyes: Denies blurriness of vision Ears, nose, mouth, throat, and face: Denies mucositis or sore throat Respiratory: Denies cough, dyspnea or wheezes Cardiovascular: Denies palpitation, chest discomfort or lower extremity swelling Gastrointestinal:  Denies nausea, heartburn or change in bowel habits Skin: Denies abnormal skin rashes Lymphatics: Denies new lymphadenopathy or easy bruising Neurological:Denies numbness, tingling or new weaknesses Behavioral/Psych: Mood is stable, no new changes  All other systems were reviewed with the patient and are negative.  MEDICAL HISTORY:  Past Medical History:  Diagnosis Date  . Allergies    peanuts, corn, beans, red grapefruit, naval oranges  . Asthma    allergy shots and medication  . Cancer [facility])    pancreatic  . Collagen vascular disease ([place])   . DDD (degenerative disc disease), lumbar   . Eustachian tube dysfunction 12/2012   rhinitis, vertigo- Dr. Wilburn Cornelia, ENT   . Fibroids   . Heart murmur    Echo 1/18: EF 55-60, no RWMA, normal diastolic function, trivial AI,  PASP 32  . History of cardiac catheterization    LHC 3/18: normal coronary arteries.   . History of nuclear stress test    ETT-Myoview 2/18: EF 62, + ECG response; apical and distal septal ischemia; intermediate risk.  Marland Kitchen Hypertension   . Iron deficiency anemia   . Lupus [facility]) 2011   Dr. Trudie Reed  . Migraine headache    trial of generic maxalt 10 mg, January 2021  . Obesity   . Seasonal allergies   . Seizure in childhood [facility])    as a child no treatment none x 30 years    SURGICAL HISTORY: Past Surgical History:  Procedure Laterality Date  . ABDOMINAL HYSTERECTOMY    . BACK SURGERY  2009  . COLONOSCOPY WITH PROPOFOL N/A 04/11/2012   Procedure: COLONOSCOPY WITH PROPOFOL;  Surgeon: Garlan Fair, MD;  Location: [facility] ENDOSCOPY;  Service: Endoscopy;  Laterality: N/A;  . HERNIA REPAIR     as child unbilical  . G2-R partial discectomy and laminectomy     Dr. Christella Noa  . LEFT HEART CATH AND CORONARY ANGIOGRAPHY N/A 04/09/2016   Procedure: Left Heart Cath and Coronary Angiography;  Surgeon: Nelva Bush, MD;  Location: [place] CV LAB;  Service: Cardiovascular;  Laterality: N/A;  . MOUTH SURGERY    . PORTACATH PLACEMENT N/A 10/11/2019   Procedure: INSERTION PORT-A-CATH LEFT SUBCLAVIAN;  Surgeon: [name], MD;  Location: [place];  Service: General;  Laterality: N/A;    I have reviewed the social history and family history with the patient and they are unchanged from previous note.  ALLERGIES:  is allergic to cinnamon, peanut-containing drug products, prednisone, corn oil, corn-containing products, and other.  MEDICATIONS:  No  current facility-administered medications for this visit.   No current outpatient medications on file.   Facility-Administered Medications Ordered in Other Visits  Medication Dose Route Frequency Provider Last Rate Last Admin  . 0.9 %  sodium chloride infusion   Intravenous Continuous Terrilee Croak, MD 75 mL/hr at 11/06/19 0856 New Bag  at 11/06/19 0856  . acetaminophen (TYLENOL) tablet 650 mg  650 mg Oral Q6H PRN Rise Patience, MD       Or  . acetaminophen (TYLENOL) suppository 650 mg  650 mg Rectal Q6H PRN Rise Patience, MD      . aspirin EC tablet 81 mg  81 mg Oral Daily Rise Patience, MD   81 mg at 11/06/19 1008  . cefTRIAXone (ROCEPHIN) 2 g in sodium chloride 0.9 % 100 mL IVPB  2 g Intravenous Q24H Rise Patience, MD   Stopped at 11/05/19 2217  . Chlorhexidine Gluconate Cloth 2 % PADS 6 each  6 each Topical Daily Dahal, Marlowe Aschoff, MD   6 each at 11/06/19 1018  . diclofenac (VOLTAREN) EC tablet 75 mg  75 mg Oral BID Terrilee Croak, MD   75 mg at 11/06/19 1008  . diphenoxylate-atropine (LOMOTIL) 2.5-0.025 MG per tablet 2 tablet  2 tablet Oral QID PRN Maryanna Shape, NP   2 tablet at 11/06/19 1457  . enoxaparin (LOVENOX) injection 80 mg  80 mg Subcutaneous Q12H Rise Patience, MD   80 mg at 11/06/19 1008  . feeding supplement (ENSURE ENLIVE) (ENSURE ENLIVE) liquid 237 mL  237 mL Oral BID BM Dahal, Binaya, MD   237 mL at 11/05/19 1352  . hydrALAZINE (APRESOLINE) injection 10 mg  10 mg Intravenous Q6H PRN Dahal, Binaya, MD      . hydroxychloroquine (PLAQUENIL) tablet 400 mg  400 mg Oral Daily Rise Patience, MD   400 mg at 11/06/19 1009  . labetalol (NORMODYNE) injection 10 mg  10 mg Intravenous Q2H PRN Rise Patience, MD      . loperamide (IMODIUM) capsule 4 mg  4 mg Oral BID PRN Terrilee Croak, MD   4 mg at 11/06/19 0841  . metoprolol tartrate (LOPRESSOR) injection 5 mg  5 mg Intravenous Q6H Dahal, Binaya, MD   5 mg at 11/06/19 1213  . metroNIDAZOLE (FLAGYL) IVPB 500 mg  500 mg Intravenous Q8H Rise Patience, MD 100 mL/hr at 11/06/19 1223 500 mg at 11/06/19 1223  . ondansetron (ZOFRAN) injection 4 mg  4 mg Intravenous Q6H Dahal, Binaya, MD   4 mg at 11/06/19 1212  . oxyCODONE (Oxy IR/ROXICODONE) immediate release tablet 5 mg  5 mg Oral Q6H PRN Rise Patience, MD      .  topiramate (TOPAMAX) tablet 50 mg  50 mg Oral QHS Rise Patience, MD   50 mg at 11/05/19 2147    PHYSICAL EXAMINATION: ECOG PERFORMANCE STATUS: {CHL ONC ECOG PS:[id number]}  There were no vitals filed for this visit. There were no vitals filed for this visit. *** GENERAL:alert, no distress and comfortable SKIN: skin color, texture, turgor are normal, no rashes or significant lesions EYES: normal, Conjunctiva are pink and non-injected, sclera clear {OROPHARYNX:no exudate, no erythema and lips, buccal mucosa, and tongue normal}  NECK: supple, thyroid normal size, non-tender, without nodularity LYMPH:  no palpable lymphadenopathy in the cervical, axillary {or inguinal} LUNGS: clear to auscultation and percussion with normal breathing effort HEART: regular rate & rhythm and no murmurs and no lower extremity edema ABDOMEN:abdomen soft, non-tender and  normal bowel sounds Musculoskeletal:no cyanosis of digits and no clubbing  NEURO: alert & oriented x 3 with fluent speech, no focal motor/sensory deficits  LABORATORY DATA:  I have reviewed the data as listed CBC Latest Ref Rng & Units 11/06/2019 11/05/2019 11/04/2019  WBC 4.0 - 10.5 K/uL 6.7 7.5 8.9  Hemoglobin 12.0 - 15.0 g/dL 7.0(L) 8.0(L) 10.0(L)  Hematocrit 36 - 46 % 21.3(L) 24.5(L) 29.3(L)  Platelets 150 - 400 K/uL 181 168 194     CMP Latest Ref Rng & Units 11/06/2019 11/05/2019 11/04/2019  Glucose 70 - 99 mg/dL 84 101(H) 113(H)  BUN 6 - 20 mg/dL <5(L) 5(L) 9  Creatinine 0.44 - 1.00 mg/dL 0.73 0.65 0.80  Sodium 135 - 145 mmol/L 142 134(L) 136  Potassium 3.5 - 5.1 mmol/L 3.4(L) 3.2(L) 2.9(L)  Chloride 98 - 111 mmol/L 108 99 95(L)  CO2 22 - 32 mmol/L 24 26 28   Calcium 8.9 - 10.3 mg/dL 8.6(L) 8.5(L) 9.5  Total Protein 6.5 - 8.1 g/dL - 6.0(L) 7.5  Total Bilirubin 0.3 - 1.2 mg/dL - 0.4 0.5  Alkaline Phos 38 - 126 U/L - 81 111  AST 15 - 41 U/L - 24 31  ALT 0 - 44 U/L - 26 33      RADIOGRAPHIC STUDIES: I have personally  reviewed the radiological images as listed and agreed with the findings in the report. CT ABDOMEN PELVIS W CONTRAST  Result Date: 11/04/2019 CLINICAL DATA:  Nausea and vomiting, history of pancreatic cancer EXAM: CT ABDOMEN AND PELVIS WITH CONTRAST TECHNIQUE: Multidetector CT imaging of the abdomen and pelvis was performed using the standard protocol following bolus administration of intravenous contrast. CONTRAST:  158mL OMNIPAQUE IOHEXOL 300 MG/ML  SOLN COMPARISON:  September 19, 2019. FINDINGS: Lower chest: No acute abnormality.  LEFT basilar atelectasis. Hepatobiliary: There are innumerable hypodense masses throughout the liver, increased in conspicuity in comparison to prior study. Representative mass of the hepatic dome measures 16 mm, previously 7 mm (series 2, image 8). Newly conspicuous mass of the RIGHT inferior liver measures 12 mm (series 2, image 28). Gallbladder is unremarkable. There is mild central biliary ductal prominence. Pancreas: Ill-defined pancreatic mass of the pancreatic head measures approximately 3.2 x 1.9 by 3.3 cm, similar to mildly increased in comparison to prior. There is upstream pancreatic ductal dilation, unchanged in comparison to prior. This mass directly abuts the duodenum. There is adjacent fat stranding along the head of the pancreas, similar in comparison to prior. Spleen: Unremarkable. Adrenals/Urinary Tract: Adrenals are unremarkable. Kidneys enhance symmetrically. No hydronephrosis. Bladder is unremarkable. Stomach/Bowel: No evidence of bowel obstruction. There is circumferential bowel wall thickening with adjacent fat stranding throughout the colon. This is most predominant in the transverse colon. Appendix is unremarkable. Vascular/Lymphatic: Peripancreatic lymph node measures 9 mm, unchanged (series 2, image 28). There is a filling defect in the LEFT internal iliac vein. Minimal atherosclerotic calcifications. Aortic branches are patent. Reproductive: Status post  hysterectomy. Other: No free air. Musculoskeletal: No acute or significant osseous findings. IMPRESSION: 1. Circumferential bowel wall thickening with adjacent fat stranding throughout the colon, most predominant in the transverse colon. This is consistent with colitis, which may be infectious or inflammatory in etiology. 2. Ill-defined pancreatic head mass consistent with known malignancy, similar to mildly increased in comparison to prior CT. There are innumerable hypodense masses throughout the liver, increased in conspicuity in comparison to prior CT. Findings are worrisome for worsening metastatic disease. 3. Filling defect in the LEFT internal iliac vein, likely a thrombus  with differential considerations including mixing artifact. Electronically Signed   By: [name] MD   On: 11/04/2019 19:10   DG Chest Port 1 View  Result Date: 11/04/2019 CLINICAL DATA:  Nausea, vomiting and diarrhea since chemotherapy 2 weeks prior, history of pancreatic cancer EXAM: PORTABLE CHEST 1 VIEW COMPARISON:  Radiograph 10/28/2019 FINDINGS: Accessed left subclavian approach Port-A-Cath tip terminates at the superior cavoatrial junction in similar position to prior. Telemetry leads overlie the chest. Stable streaky opacities in the left lung base likely reflecting scarring or subsegmental atelectasis, less likely infection. No new consolidative opacity. No pneumothorax or effusion. The cardiomediastinal contours are unremarkable. No acute osseous or soft tissue abnormality. IMPRESSION: Stable streaky opacities in the left lung base likely reflecting scarring or subsegmental atelectasis, less likely infection. No new acute cardiopulmonary abnormality. Electronically Signed   By: Lovena Le M.D.   On: 11/04/2019 20:59   VAS [place] LOWER EXTREMITY VENOUS (DVT) ([facility] and [facility] 7a-7p)  Result Date: 11/05/2019  Lower Venous DVT Study Indications: CT Abdomen filling defect.  Risk Factors: Cancer. Comparison Study: No prior  studies. Performing Technologist: Oliver Hum RVT  Examination Guidelines: A complete evaluation includes B-mode imaging, spectral Doppler, color Doppler, and power Doppler as needed of all accessible portions of each vessel. Bilateral testing is considered an integral part of a complete examination. Limited examinations for reoccurring indications may be performed as noted. The reflux portion of the exam is performed with the patient in reverse Trendelenburg.  +-----+---------------+---------+-----------+----------+--------------+ RIGHTCompressibilityPhasicitySpontaneityPropertiesThrombus Aging +-----+---------------+---------+-----------+----------+--------------+ CFV  Full           Yes      Yes                                 +-----+---------------+---------+-----------+----------+--------------+   +---------+---------------+---------+-----------+----------+--------------+ LEFT     CompressibilityPhasicitySpontaneityPropertiesThrombus Aging +---------+---------------+---------+-----------+----------+--------------+ CFV      Full           Yes      Yes                                 +---------+---------------+---------+-----------+----------+--------------+ SFJ      Full                                                        +---------+---------------+---------+-----------+----------+--------------+ FV Prox  Full                                                        +---------+---------------+---------+-----------+----------+--------------+ FV Mid   Full                                                        +---------+---------------+---------+-----------+----------+--------------+ FV DistalFull                                                        +---------+---------------+---------+-----------+----------+--------------+  PFV      Full                                                         +---------+---------------+---------+-----------+----------+--------------+ POP      Full           Yes      Yes                                 +---------+---------------+---------+-----------+----------+--------------+ PTV      Full                                                        +---------+---------------+---------+-----------+----------+--------------+ PERO     Full                                                        +---------+---------------+---------+-----------+----------+--------------+ EIV                     Yes      Yes                                 +---------+---------------+---------+-----------+----------+--------------+ CIV                                                   Not visualized +---------+---------------+---------+-----------+----------+--------------+     Summary: RIGHT: - No evidence of common femoral vein obstruction.  LEFT: - There is no evidence of deep vein thrombosis in the lower extremity.  - No cystic structure found in the popliteal fossa.  *See table(s) above for measurements and observations. Electronically signed by Monica Martinez MD on 11/05/2019 at 5:21:58 PM.    Final      ASSESSMENT & PLAN:  [name] is a 59 y.o. female with    1. 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 Her EUS biopsy with Dr Paulita Fujita on 09/25/19 shows pancreatic adenocarcinoma.  -Her CT Scan also showsseveral subcentimeterliver lesions suspicious for metastasis,also technically indeterminate. -Her 10/16/19 abdominal MRI showed multiple (>10) small liver lesions, most are consistent with metastatic lesions. Liver biopsy from 10/30/19 shows *** -I started her on first-line or neoadjuvant chemo with FOLFIRINOX q2weeks on 10/24/19. Goal of therapy is to control or shrink disease -FO results are still  pending.  -S/p C1 she developed colitis with N&V and was hospitalized on 11/04/19. ***   2. Upper abdominal cramps, Weight loss -Secondary to #1  -She has had intermittent upper abdominal cramping after certain meals. She also has had a gradual 40 pounds weight loss over 7 months (since 03/2019) -Her appetite is  otherwise stable. Continue to f/u with dietician.  -NP Lacie previously started her on Mirtazapine.  -For pain Dr Inda Merlin gave her Hydrocodone, but not taken much so far.   3. Comorbidities: Lupus, HTN, Migraines  -Symptomatic in her joints, Dx many years ago  -She is being followed by Rheumatologist Dr Wyline Copas and being treated with weekly Methotrexate injections, Plaquenil andDiclofenac. -For Migraines is well controlled on Topomax. She has been seen by Dr Jaynee Eagles.   4. Leftsmall pneumothorax -Secondary to port placement -repeat CXR9/23/21 shows enlarging but still small persistent pneumo, she is asymptomatic today  -monitoring   5. Anxiety, Social Support  -She has been very anxious with the fast pace workup and unexpected diagnosis. -She lives with her husband in [place]. She does not have children. She does have a brother who lives close by but no other near relatives. -She plans to apply for disability -We will refer her to social workerfor support  6. Left Axillary Lymphadenopathy  -Her CT chest form 10/15/19 showed multiple left axillary and subpectoral adenopathy, likely related to her recent Covid injection in left upper arm.   -Her left mammogram/US from 10/23/19 shows multiple left axillary LNs with cortical thickening up to 5-6 mm. Not palpable on exam. Radiology and I feel this is likely reactive to covid19 vaccine and the recommendation is short interval follow up.     PLAN: ***    No problem-specific Assessment & Plan notes found for this encounter.   No orders of the defined types were placed in this encounter.  All questions were answered.  The patient knows to call the clinic with any problems, questions or concerns. No barriers to learning was detected. The total time spent in the appointment was {CHL ONC TIME VISIT - [id number]}.     Joslyn Devon 11/06/2019   Oneal Deputy, am acting as scribe for Truitt Merle, MD.   {Add scribe attestation statement}

## 2019-11-06 NOTE — Progress Notes (Addendum)
HEMATOLOGY-ONCOLOGY PROGRESS NOTE  SUBJECTIVE: Feels somewhat better this morning.  Has ongoing diarrhea this morning.  Has taken Imodium.  Nausea and vomiting are much better controlled at this time.  He has been tolerating clear liquid diet and will advance to soft foods today.  Denies mucositis.  Denies chest pain or shortness of breath.  No bleeding reported.  Oncology History Overview Note  Cancer Staging Pancreatic cancer [facility]) Staging form: Exocrine Pancreas, AJCC 8th Edition - Clinical stage from 10/18/2019: Stage IV (cT3, cN1, cM1) - Signed by Truitt Merle, MD on 10/18/2019    Pancreatic cancer ([place])  09/19/2019 Imaging   CT AP w contrast 09/19/19  IMPRESSION: 1. Findings are highly concerning for probable primary pancreatic adenocarcinoma in the anterior aspect of the pancreatic head. Several prominent borderline enlarged lymph nodes are noted in the hepatoduodenal nodal station, and there are multiple indeterminate liver lesions which are highly concerning for probable hepatic metastases. Further evaluation with nonemergent abdominal MRI with and without IV gadolinium with MRCP is recommended in the near future to better evaluate these findings.   09/25/2019 Procedure   Upper EUS by Dr Paulita Fujita  IMPRESSION -There was no evidence of significant pathology in the left lobe of the liver.  -A few lymph nodes were visualized and measures in the peripancreatic region and porta hepata region.  -Hyperchoic material consistent with sludge was visualized endosonographically in the gallbladder.  -There was no sign of significant pathology in the common bile duct.  -A mass was identified in the pancreatic head. If biopsy results show adenocarcinoma, it would be staged T3N1Mx by endosonographic criteria. Fine needle aspiration performed.    09/25/2019 Initial Biopsy   FINAL MICROSCOPIC DIAGNOSIS: Fine needle aspirate, Pancreas;  MALIGNANT CELLS PRESENT CONSISTENT WITH ADENOCARCINOMA.     10/02/2019 Initial Diagnosis   Pancreatic cancer ([place])   10/11/2019 Procedure   PAC placement y Dr Barry Dienes    10/15/2019 Imaging   CT Chest  IMPRESSION: 1. Small anterior left pneumothorax with dependent atelectasis in the left lower lobe. 2. Increased number of bilateral axillary and subpectoral lymph nodes with mild lymphadenopathy in the left axilla. While this would be an atypical presentation for metastatic pancreatic cancer, this possibility is not excluded 3. Main duct dilatation in the pancreas, better assessed on abdomen CT 09/19/2019.   10/18/2019 Cancer Staging   Staging form: Exocrine Pancreas, AJCC 8th Edition - Clinical stage from 10/18/2019: Stage IV (cT3, cN1, cM1) - Signed by Truitt Merle, MD on 10/18/2019   10/24/2019 -  Chemotherapy   The patient had dexamethasone (DECADRON) 4 MG tablet, 8 mg, Oral, Daily, 1 of 1 cycle, Start date: --, End date: -- palonosetron (ALOXI) injection 0.25 mg, 0.25 mg, Intravenous,  Once, 1 of 4 cycles Administration: 0.25 mg (10/24/2019) pegfilgrastim-cbqv (UDENYCA) injection 6 mg, 6 mg, Subcutaneous, Once, 1 of 4 cycles Administration: 6 mg (10/26/2019) irinotecan (CAMPTOSAR) 300 mg in sodium chloride 0.9 % 500 mL chemo infusion, 150 mg/m2 = 300 mg, Intravenous,  Once, 1 of 4 cycles Administration: 300 mg (10/24/2019) oxaliplatin (ELOXATIN) 170 mg in dextrose 5 % 500 mL chemo infusion, 85 mg/m2 = 170 mg, Intravenous,  Once, 1 of 4 cycles Administration: 170 mg (10/24/2019) fosaprepitant (EMEND) 150 mg in sodium chloride 0.9 % 145 mL IVPB, 150 mg, Intravenous,  Once, 1 of 4 cycles Administration: 150 mg (10/24/2019) fluorouracil (ADRUCIL) 4,800 mg in sodium chloride 0.9 % 54 mL chemo infusion, 2,400 mg/m2 = 4,800 mg, Intravenous, 1 Day/Dose, 1 of 4 cycles Administration:  4,800 mg (10/24/2019) leucovorin 804 mg in sodium chloride 0.9 % 250 mL infusion, 400 mg/m2 = 804 mg, Intravenous,  Once, 1 of 4 cycles Administration: 804 mg (10/24/2019)  for  chemotherapy treatment.       REVIEW OF SYSTEMS:   Constitutional: Denies fevers, chills or abnormal weight loss Respiratory: Denies cough, dyspnea or wheezes Cardiovascular: Denies palpitation, chest discomfort Gastrointestinal: Nausea and vomiting better controlled.  Has diarrhea this morning. Skin: Denies abnormal skin rashes Lymphatics: Denies new lymphadenopathy or easy bruising Neurological:Denies numbness, tingling or new weaknesses Behavioral/Psych: Mood is stable, no new changes  Extremities: No lower extremity edema All other systems were reviewed with the patient and are negative.  I have reviewed the past medical history, past surgical history, social history and family history with the patient and they are unchanged from previous note.   PHYSICAL EXAMINATION: ECOG PERFORMANCE STATUS: 2 - Symptomatic, <50% confined to bed  Vitals:   11/06/19 0033 11/06/19 0547  BP: 118/70 108/70  Pulse: 94 89  Resp:  20  Temp:  98.1 F (36.7 C)  SpO2:  100%   Filed Weights   11/04/19 2033 11/05/19 0851  Weight: 82.6 kg 83.2 kg    Intake/Output from previous day: 10/11 0701 - 10/12 0700 In: 1228.9 [P.O.:100; I.V.:628.9; IV Piggyback:500] Out: -   GENERAL:alert, no distress and comfortable SKIN: skin color, texture, turgor are normal, no rashes or significant lesions EYES: normal, Conjunctiva are pink and non-injected, sclera clear OROPHARYNX:no exudate, no erythema and lips, buccal mucosa, and tongue normal  LUNGS: clear to auscultation and percussion with normal breathing effort HEART: regular rate & rhythm and no murmurs and no lower extremity edema ABDOMEN:abdomen soft, non-tender and normal bowel sounds NEURO: alert & oriented x 3 with fluent speech, no focal motor/sensory deficits  LABORATORY DATA:  I have reviewed the data as listed CMP Latest Ref Rng & Units 11/06/2019 11/05/2019 11/04/2019  Glucose 70 - 99 mg/dL 84 101(H) 113(H)  BUN 6 - 20 mg/dL <5(L) 5(L) 9   Creatinine 0.44 - 1.00 mg/dL 0.73 0.65 0.80  Sodium 135 - 145 mmol/L 142 134(L) 136  Potassium 3.5 - 5.1 mmol/L 3.4(L) 3.2(L) 2.9(L)  Chloride 98 - 111 mmol/L 108 99 95(L)  CO2 22 - 32 mmol/L 24 26 28   Calcium 8.9 - 10.3 mg/dL 8.6(L) 8.5(L) 9.5  Total Protein 6.5 - 8.1 g/dL - 6.0(L) 7.5  Total Bilirubin 0.3 - 1.2 mg/dL - 0.4 0.5  Alkaline Phos 38 - 126 U/L - 81 111  AST 15 - 41 U/L - 24 31  ALT 0 - 44 U/L - 26 33    Lab Results  Component Value Date   WBC 6.7 11/06/2019   HGB 7.0 (L) 11/06/2019   HCT 21.3 (L) 11/06/2019   MCV 91.0 11/06/2019   PLT 181 11/06/2019   NEUTROABS 4.1 11/06/2019    DG Chest 1 View  Result Date: 10/18/2019 CLINICAL DATA:  Pneumothorax EXAM: CHEST  1 VIEW COMPARISON:  Chest x-ray 10/11/2019, CT 10/15/2019 FINDINGS: Small left apical pneumothorax is again identified and appears enlarged in size since prior examination. Mild left basilar atelectasis is present with elevation of the left hemidiaphragm, new since prior examination. Lungs are clear otherwise. No pneumothorax or pleural effusion on the right. Cardiac size within normal limits. Left subclavian chest port is unchanged with its tip within the superior vena cava. Pulmonary vascularity is normal. IMPRESSION: Small, but enlarging left apical pneumothorax in keeping with ongoing air leak. Electronically Signed  By: Fidela Salisbury MD   On: 10/18/2019 22:58   DG Chest 2 View  Result Date: 10/28/2019 CLINICAL DATA:  Elevated white count EXAM: CHEST - 2 VIEW COMPARISON:  None. FINDINGS: The heart size and mediastinal contours are within normal limits. Again noted is streaky airspace opacity at the left lung base. A left-sided MediPort catheter seen with the tip at the superior cavoatrial junction. The visualized skeletal structures are unremarkable. IMPRESSION: Unchanged since October 18, 2019, however new since October 11, 2019 streaky airspace opacity at the left lung base which could be due to  subsegmental atelectasis and/or infectious etiology. Electronically Signed   By: Prudencio Pair M.D.   On: 10/28/2019 22:03   CT Chest Wo Contrast  Result Date: 10/15/2019 CLINICAL DATA:  Pancreatic cancer.  Staging. EXAM: CT CHEST WITHOUT CONTRAST TECHNIQUE: Multidetector CT imaging of the chest was performed following the standard protocol without IV contrast. COMPARISON:  None. FINDINGS: Cardiovascular: The heart size is normal. No substantial pericardial effusion. Left Port-A-Cath tip is in the mid to distal SVC. Mediastinum/Nodes: No mediastinal lymphadenopathy. No evidence for gross hilar lymphadenopathy although assessment is limited by the lack of intravenous contrast on today's study. The esophagus has normal imaging features. Increased number of bilateral axillary and subpectoral lymph nodes identified with mild lymphadenopathy in the left axilla (index 13 mm short axis visible on image 51/2. Right axillary nodes measure up to 10 mm short axis. Lungs/Pleura: Small anterior left pneumothorax evident. Minimal dependent atelectasis without substantial pleural effusion. No suspicious pulmonary nodule or mass within the aerated lungs. Upper Abdomen: Main duct dilatation noted in the pancreas with multiple hypoattenuating liver lesions, better assessed on abdomen CT 09/19/2019. Musculoskeletal: No worrisome lytic or sclerotic osseous abnormality. IMPRESSION: 1. Small anterior left pneumothorax with dependent atelectasis in the left lower lobe. 2. Increased number of bilateral axillary and subpectoral lymph nodes with mild lymphadenopathy in the left axilla. While this would be an atypical presentation for metastatic pancreatic cancer, this possibility is not excluded 3. Main duct dilatation in the pancreas, better assessed on abdomen CT 09/19/2019. These results will be called to the ordering clinician or representative by the Radiologist Assistant, and communication documented in the PACS or [employer].  Electronically Signed   By: Misty Stanley M.D.   On: 10/15/2019 08:12   MR Abdomen W Wo Contrast  Addendum Date: 10/16/2019   ADDENDUM REPORT: 10/16/2019 14:14 ADDENDUM: These results were called by telephone at the time of interpretation on 10/16/2019 at 2:03 pm to provider Truitt Merle , who verbally acknowledged these results. Electronically Signed   By: Ilona Sorrel M.D.   On: 10/16/2019 14:14   Result Date: 10/16/2019 CLINICAL DATA:  Recent diagnosis of biopsy. Pancreatic head adenocarcinoma. Indeterminate liver lesions on CT. EXAM: MRI ABDOMEN WITHOUT AND WITH CONTRAST (INCLUDING MRCP) TECHNIQUE: Multiplanar multisequence MR imaging of the abdomen was performed both before and after the administration of intravenous contrast. Heavily T2-weighted images of the biliary and pancreatic ducts were obtained, and three-dimensional MRCP images were rendered by post processing. CONTRAST:  35mL GADAVIST GADOBUTROL 1 MMOL/ML IV SOLN COMPARISON:  09/19/2019 CT abdomen. FINDINGS: Substantially motion degraded scan, particularly on the postcontrast sequences, significantly limiting assessment. Lower chest: Mild dependent left lung base atelectasis. Probable persistent anterior small left lung base pneumothorax, better seen on chest CT from 1 day prior. Hepatobiliary: Normal liver size and configuration. No hepatic steatosis. Numerous (greater than 10) small similar mildly T2 hyperintense T1 hypointense liver masses scattered throughout the  liver, largest 1.0 cm anteriorly at the liver dome (series 4/image 7), 1.0 cm posteriorly at the right liver dome (series 4/image 9) and 0.9 cm inferiorly in the liver near the gallbladder (series 4/image 21). These lesions appear to demonstrate targetoid enhancement on the limited motion degraded postcontrast sequences (for example on series 17/image 12 anteriorly at the liver dome). Normal gallbladder with no cholelithiasis. No biliary ductal dilatation. Common bile duct diameter 5 mm.  No choledocholithiasis. No biliary masses or beading. Pancreas: Poorly marginated hypoenhancing 3.7 x 2.9 cm pancreatic head mass (series 4/image 23), which appears to invade the anterior peripancreatic fat. Diffuse irregular dilatation of main pancreatic duct in pancreatic body and tail up to 6 mm diameter. No pancreas divisum. Spleen: Normal size. No mass. Adrenals/Urinary Tract: Normal adrenals. No hydronephrosis. Normal kidneys with no renal mass. Stomach/Bowel: Normal non-distended stomach. Visualized small and large bowel is normal caliber, with no bowel wall thickening. Vascular/Lymphatic: Normal caliber abdominal aorta. Mild narrowing of the main portal vein by the pancreatic head mass. SMV, splenic vein, right and left portal veins, renal veins and hepatic veins appear patent. No evidence of tumor involvement of SMA. Mild porta hepatis adenopathy up to 1.0 cm (series 25/image 38). Other: No abdominal ascites or focal fluid collection. Musculoskeletal: No aggressive appearing focal osseous lesions. IMPRESSION: 1. Substantially motion degraded scan. 2. Probable persistent small anterior left lung base pneumothorax, better seen on chest CT from 1 day prior. 3. Poorly marginated hypoenhancing 3.7 x 2.9 cm pancreatic head mass, which appears to invade the anterior peripancreatic fat, compatible with known pancreatic adenocarcinoma. Diffuse irregular dilatation of the main pancreatic duct in the pancreatic body and tail. Mild narrowing of the main portal vein by the mass. Abdominal vasculature remains patent and otherwise uninvolved. 4. Numerous (greater than 10) small liver masses scattered throughout the liver, largest 1.0 cm, which appear to demonstrate targetoid enhancement on the limited motion degraded postcontrast sequences, compatible with liver metastases. 5. Mild porta hepatis adenopathy, suspicious for metastatic disease. Electronically Signed: By: Ilona Sorrel M.D. On: 10/16/2019 14:04   CT ABDOMEN  PELVIS W CONTRAST  Result Date: 11/04/2019 CLINICAL DATA:  Nausea and vomiting, history of pancreatic cancer EXAM: CT ABDOMEN AND PELVIS WITH CONTRAST TECHNIQUE: Multidetector CT imaging of the abdomen and pelvis was performed using the standard protocol following bolus administration of intravenous contrast. CONTRAST:  118mL OMNIPAQUE IOHEXOL 300 MG/ML  SOLN COMPARISON:  September 19, 2019. FINDINGS: Lower chest: No acute abnormality.  LEFT basilar atelectasis. Hepatobiliary: There are innumerable hypodense masses throughout the liver, increased in conspicuity in comparison to prior study. Representative mass of the hepatic dome measures 16 mm, previously 7 mm (series 2, image 8). Newly conspicuous mass of the RIGHT inferior liver measures 12 mm (series 2, image 28). Gallbladder is unremarkable. There is mild central biliary ductal prominence. Pancreas: Ill-defined pancreatic mass of the pancreatic head measures approximately 3.2 x 1.9 by 3.3 cm, similar to mildly increased in comparison to prior. There is upstream pancreatic ductal dilation, unchanged in comparison to prior. This mass directly abuts the duodenum. There is adjacent fat stranding along the head of the pancreas, similar in comparison to prior. Spleen: Unremarkable. Adrenals/Urinary Tract: Adrenals are unremarkable. Kidneys enhance symmetrically. No hydronephrosis. Bladder is unremarkable. Stomach/Bowel: No evidence of bowel obstruction. There is circumferential bowel wall thickening with adjacent fat stranding throughout the colon. This is most predominant in the transverse colon. Appendix is unremarkable. Vascular/Lymphatic: Peripancreatic lymph node measures 9 mm, unchanged (series 2, image  28). There is a filling defect in the LEFT internal iliac vein. Minimal atherosclerotic calcifications. Aortic branches are patent. Reproductive: Status post hysterectomy. Other: No free air. Musculoskeletal: No acute or significant osseous findings. IMPRESSION:  1. Circumferential bowel wall thickening with adjacent fat stranding throughout the colon, most predominant in the transverse colon. This is consistent with colitis, which may be infectious or inflammatory in etiology. 2. Ill-defined pancreatic head mass consistent with known malignancy, similar to mildly increased in comparison to prior CT. There are innumerable hypodense masses throughout the liver, increased in conspicuity in comparison to prior CT. Findings are worrisome for worsening metastatic disease. 3. Filling defect in the LEFT internal iliac vein, likely a thrombus with differential considerations including mixing artifact. Electronically Signed   By: [name] MD   On: 11/04/2019 19:10   MR 3D Recon At Scanner  Addendum Date: 10/16/2019   ADDENDUM REPORT: 10/16/2019 14:14 ADDENDUM: These results were called by telephone at the time of interpretation on 10/16/2019 at 2:03 pm to provider Truitt Merle , who verbally acknowledged these results. Electronically Signed   By: Ilona Sorrel M.D.   On: 10/16/2019 14:14   Result Date: 10/16/2019 CLINICAL DATA:  Recent diagnosis of biopsy. Pancreatic head adenocarcinoma. Indeterminate liver lesions on CT. EXAM: MRI ABDOMEN WITHOUT AND WITH CONTRAST (INCLUDING MRCP) TECHNIQUE: Multiplanar multisequence MR imaging of the abdomen was performed both before and after the administration of intravenous contrast. Heavily T2-weighted images of the biliary and pancreatic ducts were obtained, and three-dimensional MRCP images were rendered by post processing. CONTRAST:  66mL GADAVIST GADOBUTROL 1 MMOL/ML IV SOLN COMPARISON:  09/19/2019 CT abdomen. FINDINGS: Substantially motion degraded scan, particularly on the postcontrast sequences, significantly limiting assessment. Lower chest: Mild dependent left lung base atelectasis. Probable persistent anterior small left lung base pneumothorax, better seen on chest CT from 1 day prior. Hepatobiliary: Normal liver size and  configuration. No hepatic steatosis. Numerous (greater than 10) small similar mildly T2 hyperintense T1 hypointense liver masses scattered throughout the liver, largest 1.0 cm anteriorly at the liver dome (series 4/image 7), 1.0 cm posteriorly at the right liver dome (series 4/image 9) and 0.9 cm inferiorly in the liver near the gallbladder (series 4/image 21). These lesions appear to demonstrate targetoid enhancement on the limited motion degraded postcontrast sequences (for example on series 17/image 12 anteriorly at the liver dome). Normal gallbladder with no cholelithiasis. No biliary ductal dilatation. Common bile duct diameter 5 mm. No choledocholithiasis. No biliary masses or beading. Pancreas: Poorly marginated hypoenhancing 3.7 x 2.9 cm pancreatic head mass (series 4/image 23), which appears to invade the anterior peripancreatic fat. Diffuse irregular dilatation of main pancreatic duct in pancreatic body and tail up to 6 mm diameter. No pancreas divisum. Spleen: Normal size. No mass. Adrenals/Urinary Tract: Normal adrenals. No hydronephrosis. Normal kidneys with no renal mass. Stomach/Bowel: Normal non-distended stomach. Visualized small and large bowel is normal caliber, with no bowel wall thickening. Vascular/Lymphatic: Normal caliber abdominal aorta. Mild narrowing of the main portal vein by the pancreatic head mass. SMV, splenic vein, right and left portal veins, renal veins and hepatic veins appear patent. No evidence of tumor involvement of SMA. Mild porta hepatis adenopathy up to 1.0 cm (series 25/image 38). Other: No abdominal ascites or focal fluid collection. Musculoskeletal: No aggressive appearing focal osseous lesions. IMPRESSION: 1. Substantially motion degraded scan. 2. Probable persistent small anterior left lung base pneumothorax, better seen on chest CT from 1 day prior. 3. Poorly marginated hypoenhancing 3.7 x 2.9 cm  pancreatic head mass, which appears to invade the anterior  peripancreatic fat, compatible with known pancreatic adenocarcinoma. Diffuse irregular dilatation of the main pancreatic duct in the pancreatic body and tail. Mild narrowing of the main portal vein by the mass. Abdominal vasculature remains patent and otherwise uninvolved. 4. Numerous (greater than 10) small liver masses scattered throughout the liver, largest 1.0 cm, which appear to demonstrate targetoid enhancement on the limited motion degraded postcontrast sequences, compatible with liver metastases. 5. Mild porta hepatis adenopathy, suspicious for metastatic disease. Electronically Signed: By: Ilona Sorrel M.D. On: 10/16/2019 14:04   US BIOPSY (LIVER)  Result Date: 10/30/2019 INDICATION: 59 year old female with a history of pancreatic cancer and small hepatic lesions concerning for metastatic disease. EXAM: ULTRASOUND BIOPSY CORE LIVER MEDICATIONS: None. ANESTHESIA/SEDATION: Moderate (conscious) sedation was employed during this procedure. A total of Versed 4 mg and Fentanyl 100 mcg was administered intravenously. Moderate Sedation Time: 14 minutes. The patient's level of consciousness and vital signs were monitored continuously by radiology nursing throughout the procedure under my direct supervision. FLUOROSCOPY TIME:  None. COMPLICATIONS: None immediate. PROCEDURE: Informed written consent was obtained from the patient after a thorough discussion of the procedural risks, benefits and alternatives. All questions were addressed. Maximal Sterile Barrier Technique was utilized including caps, mask, sterile gowns, sterile gloves, sterile drape, hand hygiene and skin antiseptic. A timeout was performed prior to the initiation of the procedure. Ultrasound was used to interrogate the liver. There are multiple small hypoechoic solid lesions scattered throughout the liver. A suitable lesion in the right hemi liver was identified. A skin entry site was selected and marked. The overlying skin was sterilely prepped  and draped in the standard fashion using chlorhexidine skin prep. Local anesthesia was attained by infiltration with 1% lidocaine. A small dermatotomy was made. Under real-time ultrasound guidance, a 17 gauge introducer needle was advanced through the liver and positioned at the margin of the lesion. Multiple 18 gauge core biopsies were then obtained coaxially using the bio Pince automated biopsy device. Biopsy specimens were placed in formalin and delivered to pathology for further analysis. As the introducer needle was removed, the biopsy tract was embolized with a Gel-Foam slurry. Post biopsy ultrasound images demonstrate no evidence of hematoma or immediate complication. The patient tolerated the procedure well. IMPRESSION: Technically successful ultrasound-guided core biopsy of liver lesion. Signed, Criselda Peaches, MD, [facility] Electronically Signed   By: Jacqulynn Cadet M.D.   On: 10/30/2019 15:34   DG Chest Port 1 View  Result Date: 11/04/2019 CLINICAL DATA:  Nausea, vomiting and diarrhea since chemotherapy 2 weeks prior, history of pancreatic cancer EXAM: PORTABLE CHEST 1 VIEW COMPARISON:  Radiograph 10/28/2019 FINDINGS: Accessed left subclavian approach Port-A-Cath tip terminates at the superior cavoatrial junction in similar position to prior. Telemetry leads overlie the chest. Stable streaky opacities in the left lung base likely reflecting scarring or subsegmental atelectasis, less likely infection. No new consolidative opacity. No pneumothorax or effusion. The cardiomediastinal contours are unremarkable. No acute osseous or soft tissue abnormality. IMPRESSION: Stable streaky opacities in the left lung base likely reflecting scarring or subsegmental atelectasis, less likely infection. No new acute cardiopulmonary abnormality. Electronically Signed   By: Lovena Le M.D.   On: 11/04/2019 20:59   DG CHEST PORT 1  VIEW  Result Date: 10/11/2019 CLINICAL DATA:  Port-A-Cath placement.  Pancreatic carcinoma EXAM: PORTABLE CHEST 1 VIEW COMPARISON:  November 30, 2012. FINDINGS: Port-A-Cath tip is in the superior vena  cava. No pneumothorax. The lungs are clear. The heart size and pulmonary vascularity are within normal limits. No adenopathy. No bone lesions. IMPRESSION: Port-A-Cath tip in superior vena cava. No pneumothorax. Lungs clear. No adenopathy. Electronically Signed   By: Lowella Grip III M.D.   On: 10/11/2019 09:11   DG Fluoro Guide CV Line-No Report  Result Date: 10/11/2019 Fluoroscopy was utilized by the requesting physician.  No radiographic interpretation.   MM DIAG BREAST TOMO UNI LEFT  Addendum Date: 10/25/2019   ADDENDUM REPORT: 10/25/2019 14:57 ADDENDUM: I spoke with the patient on the morning of 10/25/2019. She states her 2nd COVID vaccination was on 09/23/2019. She prefers not to wait much longer for re-evaluation. She has been scheduled for a follow-up ultrasound on 11/05/2019. This will be 6 weeks after the second COVID dose. If her lymphadenopathy has not improved, ultrasound-guided biopsy is recommended. A biopsy slot is being held for the patient on the same day. Electronically Signed   By: Kristopher Oppenheim M.D.   On: 10/25/2019 14:57   Addendum Date: 10/24/2019   ADDENDUM REPORT: 10/24/2019 15:27 ADDENDUM: This is an addendum to the report originally dictated on 10/15/2019. I spoke with the patient's referring provider Dr. Burr Medico today, who provided additional history that the patient had the second COVID vaccine recently within her left arm. This may account for her current imaging findings. Therefore, the IMPRESSION, RECOMMENDATION BI-RADS CATEGORY should read as follows. IMPRESSION: 1. Left axillary lymphadenopathy in keeping with recent CT findings may be related to recent second COVID vaccine in the ipsilateral arm. 2. No mammographic evidence of malignancy within the left breast.  RECOMMENDATION: Recommendation is for the patient to return in 8 weeks for follow-up ultrasound evaluation of the left axilla to ensure improvement/resolution of her current lymphadenopathy. BI-RADS CATEGORY: 3: Probably benign. Electronically Signed   By: Kristopher Oppenheim M.D.   On: 10/24/2019 15:27   Result Date: 10/25/2019 CLINICAL DATA:  59 year old female, recently diagnosed with pancreatic cancer and prominent left axillary lymphadenopathy on recent CT evaluation. The patient had a port placed on the left side approximately 2 weeks ago. EXAM: DIGITAL DIAGNOSTIC LEFT MAMMOGRAM WITH CAD AND TOMO ULTRASOUND LEFT BREAST COMPARISON:  Previous exam(s). ACR Breast Density Category c: The breast tissue is heterogeneously dense, which may obscure small masses. FINDINGS: No suspicious mammographic findings are identified in the left breast. The parenchymal pattern is stable. Mammographic images were processed with CAD. Targeted ultrasound is performed, showing multiple prominent left axillary lymph nodes with diffuse cortical thickening up to 5-6 mm. IMPRESSION: 1. Left axillary lymphadenopathy in keeping with recent CT findings. Recommendation is for ultrasound-guided biopsy. The differential includes metastatic disease, lymphoproliferative disorder and benign reactive changes. 2. No mammographic evidence of malignancy within the left breast. RECOMMENDATION: Ultrasound-guided biopsy of a left axillary lymph node is recommended. Samples should be sent in both formalin and saline. I have discussed the findings and recommendations with the patient. If applicable, a reminder letter will be sent to the patient regarding the next appointment. BI-RADS CATEGORY  4: Suspicious. Electronically Signed: By: Kristopher Oppenheim M.D. On: 10/23/2019 10:55   [place] AXILLA LEFT  Addendum Date: 10/25/2019   ADDENDUM REPORT: 10/25/2019 14:57 ADDENDUM: I spoke with the patient on the morning of 10/25/2019. She states her 2nd COVID vaccination  was on 09/23/2019. She prefers not to wait much longer for re-evaluation. She has been scheduled for a follow-up ultrasound on 11/05/2019. This will be 6 weeks after the second COVID dose. If her lymphadenopathy  has not improved, ultrasound-guided biopsy is recommended. A biopsy slot is being held for the patient on the same day. Electronically Signed   By: Kristopher Oppenheim M.D.   On: 10/25/2019 14:57   Addendum Date: 10/24/2019   ADDENDUM REPORT: 10/24/2019 15:27 ADDENDUM: This is an addendum to the report originally dictated on 10/15/2019. I spoke with the patient's referring provider Dr. Burr Medico today, who provided additional history that the patient had the second COVID vaccine recently within her left arm. This may account for her current imaging findings. Therefore, the IMPRESSION, RECOMMENDATION BI-RADS CATEGORY should read as follows. IMPRESSION: 1. Left axillary lymphadenopathy in keeping with recent CT findings may be related to recent second COVID vaccine in the ipsilateral arm. 2. No mammographic evidence of malignancy within the left breast. RECOMMENDATION: Recommendation is for the patient to return in 8 weeks for follow-up ultrasound evaluation of the left axilla to ensure improvement/resolution of her current lymphadenopathy. BI-RADS CATEGORY: 3: Probably benign. Electronically Signed   By: Kristopher Oppenheim M.D.   On: 10/24/2019 15:27   Result Date: 10/25/2019 CLINICAL DATA:  59 year old female, recently diagnosed with pancreatic cancer and prominent left axillary lymphadenopathy on recent CT evaluation. The patient had a port placed on the left side approximately 2 weeks ago. EXAM: DIGITAL DIAGNOSTIC LEFT MAMMOGRAM WITH CAD AND TOMO ULTRASOUND LEFT BREAST COMPARISON:  Previous exam(s). ACR Breast Density Category c: The breast tissue is heterogeneously dense, which may obscure small masses. FINDINGS: No suspicious mammographic findings are identified in the left breast. The parenchymal pattern is  stable. Mammographic images were processed with CAD. Targeted ultrasound is performed, showing multiple prominent left axillary lymph nodes with diffuse cortical thickening up to 5-6 mm. IMPRESSION: 1. Left axillary lymphadenopathy in keeping with recent CT findings. Recommendation is for ultrasound-guided biopsy. The differential includes metastatic disease, lymphoproliferative disorder and benign reactive changes. 2. No mammographic evidence of malignancy within the left breast. RECOMMENDATION: Ultrasound-guided biopsy of a left axillary lymph node is recommended. Samples should be sent in both formalin and saline. I have discussed the findings and recommendations with the patient. If applicable, a reminder letter will be sent to the patient regarding the next appointment. BI-RADS CATEGORY  4: Suspicious. Electronically Signed: By: Kristopher Oppenheim M.D. On: 10/23/2019 10:55   VAS [place] LOWER EXTREMITY VENOUS (DVT) (MC and WL 7a-7p)  Result Date: 11/05/2019  Lower Venous DVT Study Indications: CT Abdomen filling defect.  Risk Factors: Cancer. Comparison Study: No prior studies. Performing Technologist: Oliver Hum RVT  Examination Guidelines: A complete evaluation includes B-mode imaging, spectral Doppler, color Doppler, and power Doppler as needed of all accessible portions of each vessel. Bilateral testing is considered an integral part of a complete examination. Limited examinations for reoccurring indications may be performed as noted. The reflux portion of the exam is performed with the patient in reverse Trendelenburg.  +-----+---------------+---------+-----------+----------+--------------+ RIGHTCompressibilityPhasicitySpontaneityPropertiesThrombus Aging +-----+---------------+---------+-----------+----------+--------------+ CFV  Full           Yes      Yes                                 +-----+---------------+---------+-----------+----------+--------------+    +---------+---------------+---------+-----------+----------+--------------+ LEFT     CompressibilityPhasicitySpontaneityPropertiesThrombus Aging +---------+---------------+---------+-----------+----------+--------------+ CFV      Full           Yes      Yes                                 +---------+---------------+---------+-----------+----------+--------------+  SFJ      Full                                                        +---------+---------------+---------+-----------+----------+--------------+ FV Prox  Full                                                        +---------+---------------+---------+-----------+----------+--------------+ FV Mid   Full                                                        +---------+---------------+---------+-----------+----------+--------------+ FV DistalFull                                                        +---------+---------------+---------+-----------+----------+--------------+ PFV      Full                                                        +---------+---------------+---------+-----------+----------+--------------+ POP      Full           Yes      Yes                                 +---------+---------------+---------+-----------+----------+--------------+ PTV      Full                                                        +---------+---------------+---------+-----------+----------+--------------+ PERO     Full                                                        +---------+---------------+---------+-----------+----------+--------------+ EIV                     Yes      Yes                                 +---------+---------------+---------+-----------+----------+--------------+ CIV                                                   Not  visualized +---------+---------------+---------+-----------+----------+--------------+     Summary: RIGHT: - No evidence of common  femoral vein obstruction.  LEFT: - There is no evidence of deep vein thrombosis in the lower extremity.  - No cystic structure found in the popliteal fossa.  *See table(s) above for measurements and observations. Electronically signed by Monica Martinez MD on 11/05/2019 at 5:21:58 PM.    Final     ASSESSMENT AND PLAN: 1.  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  Pancolitis with nausea, vomiting, diarrhea 3.  Left iliac vein DVT 4.  Anemia secondary to recent chemotherapy and underlying lupus 5.  Hypokalemia 6.  Hypertension 7.  Migraine headaches 8.  Lupus  -The patient received her first cycle of FOLFIRINOX on 10/24/2019.  She had a lot of difficulty following her chemotherapy with nausea, vomiting, diarrhea.  Her next cycle of chemotherapy is scheduled for later this week and will likely need to delay by at least 1 week. -CT scan consistent with colitis.  Her nausea and vomiting are much better controlled and she is tolerating clear liquid diet.  She will advance to a soft diet today.  She continues to have diarrhea and Imodium is helping.  I have added Lomotil 2 tablets 4 times a day as needed. -The patient was found to have a new left iliac vein DVT.  She is currently on Lovenox 80 mg twice daily.  Discussed anticoagulation options with the patient including continuation of Lovenox upon discharge versus switching to DOAC.  Follow recommendation per Dr. Burr Medico pending. -The patient's hemoglobin today is 7.0.  I have recommended for her to receive 1 unit PRBCs today.  I discussed the risks/benefits of blood transfusion with the patient.  She wishes to think about this more for agreeing to proceed.  I have advised nursing to let me know what the patient wishes to proceed and I will enter the orders.  Follow hemoglobin closely.  Transfuse for hemoglobin less than 7.5. -On potassium replacement per hospitalist. -Continue management of chronic medical conditions  per hospitalist.   LOS: 2 days   Mikey Bussing, DNP, AGPCNP-BC, AOCNP 11/06/19   Addendum  I have seen the patient, examined her. I agree with the assessment and and plan and have edited the notes.   Mrs. Mcenaney developed significant nausea, vomiting and diarrhea after first cycle chemotherapy FOLFIRINOX for her metastatic pancreatic cancer.  C. difficile and GI panel were negative.  She is recovering slowly.  I discussed her recent liver biopsy, which confirmed metastatic pancreatic cancer.  I will cancel her second cycle chemotherapy this week, will postpone 1 to 2 weeks until she recovers well.  We discussed dose reduction, or hold irinotecan for cycle 2 chemo.   Moderate anemia is probably related chemo (she has anemia before chemo too due to metastatic cancer). Agree with blood transfusion, she wants to see her H/H tomorrow morning before she gets blood transfusion.  If her insurance covers Xarelto or Eliquis well, she can be discharge on one of this agent.   All questions were answered. We will f/u while she is in house.   Truitt Merle  11/06/2019

## 2019-11-06 NOTE — Progress Notes (Signed)
Initial Nutrition Assessment  INTERVENTION:   -Ensure Enlive po BID, each supplement provides 350 kcal and 20 grams of protein (mixed with milk to dilute sweetness) -Reviewed food options with patient given multiple food allergies  NUTRITION DIAGNOSIS:   Increased nutrient needs related to cancer and cancer related treatments as evidenced by estimated needs.  GOAL:   Patient will meet greater than or equal to 90% of their needs  MONITOR:   PO intake, Supplement acceptance, Labs, Weight trends, I & O's  REASON FOR ASSESSMENT:   Malnutrition Screening Tool    ASSESSMENT:   59 y.o. female with PMH of pancreatic cancer recently started on chemotherapy 2 weeks ago, also with history of lupus, hypertension, chronic anemiaPatient presented to the ED on 11/04/2019 with complaint of persistent nausea, vomiting, diarrhea, abdominal pain, poor oral intake, symptoms started after chemotherapy 2 weeks ago and have been progressively worsening.  Patient reports having N/V and diarrhea which started following her chemotherapy treatment on 10/1. Pt has been unable to tolerate any foods since that time.  Currently she denies any N/V today but her diarrhea has persisted.  Pt had oatmeal with pieces of banana for breakfast.  Has Ensure and Boost Breeze supplements ordered but is not a fan of how sweet they are. Will d/c Boost Breeze and will order Ensure mixed with whole milk to cut down on the sweetness.  Pt with multiple food allergies which include: -peanuts (does not consume any other nuts) -cinnamon, basil, oregano -garlic, tomatoes -oranges, grapefruit -potatoes -corn, corn oil (does not include corn syrup) -dried beans and green beans  Reviewed food options with pt and identified foods to include in her diet as long as she is not allergic or has a reaction. Pt follows up closely with an allergist.  Per weight records, pt has lost 41 lbs since 3/25 (18% wt loss x 6.5 months,  significant for time frame). Pt is at risk of malnutrition if continues to have poor PO.  Medications: IV Zofran, KLOR-CON  Labs reviewed: Low K  NUTRITION - FOCUSED PHYSICAL EXAM:  No depletions.  Diet Order:   Diet Order            DIET SOFT Room service appropriate? Yes; Fluid consistency: Thin  Diet effective now                 EDUCATION NEEDS:   Education needs have been addressed  Skin:  Skin Assessment: Reviewed RN Assessment  Last BM:  10/12 -type 7  Height:   Ht Readings from Last 1 Encounters:  11/05/19 5\' 7"  (1.702 m)    Weight:   Wt Readings from Last 1 Encounters:  11/05/19 83.2 kg   BMI:  Body mass index is 28.74 kg/m.  Estimated Nutritional Needs:   Kcal:  2200-2400  Protein:  115-125g  Fluid:  2L/day  Clayton Bibles, MS, RD, LDN Inpatient Clinical Dietitian Contact information available via Amion

## 2019-11-06 NOTE — Progress Notes (Signed)
PROGRESS NOTE  Cynthia Frye  DOB: 1960/04/27  PCP: Josetta Huddle, MD KLK:[id number]  DOA: 11/04/2019  LOS: 2 days   Chief Complaint  Patient presents with  . Emesis   Brief narrative: Cynthia Frye is a 59 y.o. female with PMH of pancreatic cancer recently started on chemotherapy 2 weeks ago, also with history of lupus, hypertension, chronic anemia Patient presented to the ED on 11/04/2019 with complaint of persistent nausea, vomiting, diarrhea, abdominal pain, poor oral intake, symptoms started after chemotherapy 2 weeks ago and have been progressively worsening.  Patient had presented to the ED with similar complaints on 10/8 and was discharged home on antibiotics but she was unable to hold them down because of vomiting. Of note, 10/5, patient had iopsy of liver lesions for suspicion of metastasis.  In the ED, patient was afebrile, tachycardic 245, blood pressure 146/97, maintaining oxygen saturation on room air. Labs with normal WBC count, hemoglobin 10, platelets 194, potassium 2.9, creatinine 0.8, magnesium 1.6, lactic acid 0.5 Respiratory virus panel including influenza and Covid PCR negative.  CT abdomen and pelvis showed circumferential bowel wall thickening with adjacent fat stranding throughout the colon, most predominant in the transverse colon. Findings are consistent with colitis, which may be infectious or inflammatory in etiology. It also showed possible left internal iliac vein thrombosis.  Patient was admitted to hospitalist service for colitis and DVT.  Subjective: Patient was seen and examined this morning. Nausea improving.  Able to tolerate liquid diet.  Wants to try soft diet.  Diarrhea continues. Remains on Imodium.  Oncology added Lomotil.  Assessment/Plan: Pancolitis -Presented with intractable nausea, vomiting, diarrhea, abdominal cramping progressively worsening for 2 weeks since chemotherapy. -WBC count normal, lactic acid level normal.     -Pancolitis noted in CT scan. Suspect chemotherapy-induced colitis and diarrhea.. -Nausea vomiting improving.  Able to tolerate liquid diet today.  Advance as tolerated.  Diarrhea continues.  -On scheduled Zofran and as needed Lomotil.  -Currently on normal saline with potassium at 125 mill per hour.  Reduce to 75 mill per hour. -Currently on empiric antibiotics. No fever.  Normal WC count.  Will stop IV antibiotics. Recent Labs  Lab 11/04/19 1549 11/05/19 0409 11/05/19 0704 11/06/19 0500  WBC 8.9 7.5  --  6.7  LATICACIDVEN  --   --  0.5  --     SIRS Sinus tachycardia -Heart rate elevated up to 145 at presentation likely related to dehydration. -Tachycardia improved.  Left iliac vein DVT  -CT abdomen and pelvis showed filling defect in the left internal iliac vein likely a thrombus.   -Currently on full dose Lovenox. DOAC planned at discharge.  Hypokalemia/hypomagnesemia/hypophosphatemia -Magnesium and phosphorus level improved.  Potassium remains low at 3.4 today.  Continue potassium rider.  Oral potassium supplement as well.  Continue to monitor electrolytes. Recent Labs  Lab 11/04/19 1549 11/05/19 0409 11/06/19 0500  K 2.9* 3.2* 3.4*  MG 1.6* 2.0 2.0  PHOS  --  2.1* 2.6   Essential hypertension -Home medications include Toprol 50 mg daily, HCTZ 12.5 mg daily, losartan 50 mg daily, potassium supplement. -Hypertensive on presentation because she was not able to take oral pills -Blood pressure is now improved.  Resume oral Toprol. -Keep losartan and HCTZ on hold.  Metastatic pancreatic cancer  -Follows with Dr. Burr Medico oncologist.  Last chemotherapy 2 weeks ago. -10/5, CT head liver biopsy to rule out metastasis. -CT abdomen on admission also showed innumerable hypodense masses throughout the liver, increased in conspicuity worrisome  for worsening metastatic disease.  History of lupus - presently on Plaquenil 400 mg daily and diclofenac.  She was on methotrexate subcu  weekly previously which was held before chemotherapy was initiated.  Acute on chronic anemia -Chronically anemic due to malignancy and lupus. -No active blood loss.  Hemoglobin low at 7 this morning.  Patient wants to see repeat hemoglobin tomorrow before agreeing to blood work. Recent Labs    10/28/19 2115 10/28/19 2115 10/30/19 1205 10/30/19 1205 11/04/19 1549 11/04/19 1549 11/05/19 0409 11/06/19 0500  HGB 9.9*  --  9.5*  --  10.0*  --  8.0* 7.0*  MCV 86.7   < > 88.3   < > 86.9   < > 89.1 91.0   < > = values in this interval not displayed.    Home meds  Toprol 50 mg daily, HCTZ 12.5 mg daily, losartan 50 mg daily, potassium supplement Aspirin 81 mg daily, Nexium 10 mg daily, iron sulfate, Plaquenil 400 mg daily, methotrexate 25 mg subcu weekly Oxycodone every 6 hours as needed, Topamax 50 mg at bedtime, Maxalt as needed for migraine Xanax 0.25 mg at bedtime for anxiety   Mobility: Encourage ambulation Code Status:   Code Status: Full Code  Nutritional status: Body mass index is 28.74 kg/m. Nutrition Problem: Increased nutrient needs Etiology: cancer and cancer related treatments Signs/Symptoms: estimated needs Diet Order            DIET SOFT Room service appropriate? Yes; Fluid consistency: Thin  Diet effective now                DVT prophylaxis: Lovenox full dose   Antimicrobials:  Stop antibiotics today Fluid: Normal saline with potassium 75 mL/h Consultants: Oncology Family Communication:  None at bedside  Status is: Inpatient  Remains inpatient appropriate because: IV antibiotics, IV fluids,   Dispo: The patient is from: Home              Anticipated d/c is to: Home              Anticipated d/c date is: 2 to 3 days              Patient currently is not medically stable to d/c.  Infusions:  . sodium chloride 75 mL/hr at 11/06/19 0856  . metronidazole 500 mg (11/06/19 1223)    Scheduled Meds: . aspirin EC  81 mg Oral Daily  . Chlorhexidine  Gluconate Cloth  6 each Topical Daily  . diclofenac  75 mg Oral BID  . enoxaparin (LOVENOX) injection  80 mg Subcutaneous Q12H  . feeding supplement (ENSURE ENLIVE)  237 mL Oral BID BM  . hydroxychloroquine  400 mg Oral Daily  . [START ON 11/07/2019] metoprolol tartrate  25 mg Oral BID  . ondansetron (ZOFRAN) IV  4 mg Intravenous Q6H  . topiramate  50 mg Oral QHS    Antimicrobials: Anti-infectives (From admission, onward)   Start     Dose/Rate Route Frequency Ordered Stop   11/05/19 2200  cefTRIAXone (ROCEPHIN) 2 g in sodium chloride 0.9 % 100 mL IVPB  Status:  Discontinued        2 g 200 mL/hr over 30 Minutes Intravenous Every 24 hours 11/04/19 2126 11/06/19 1713   11/05/19 1000  hydroxychloroquine (PLAQUENIL) tablet 400 mg        400 mg Oral Daily 11/04/19 2124     11/05/19 0400  metroNIDAZOLE (FLAGYL) IVPB 500 mg        500 mg  100 mL/hr over 60 Minutes Intravenous Every 8 hours 11/04/19 2126     11/04/19 2015  cefTRIAXone (ROCEPHIN) 2 g in sodium chloride 0.9 % 100 mL IVPB        2 g 200 mL/hr over 30 Minutes Intravenous  Once 11/04/19 2003 11/04/19 2238   11/04/19 2000  metroNIDAZOLE (FLAGYL) IVPB 500 mg        500 mg 100 mL/hr over 60 Minutes Intravenous  Once 11/04/19 1958 11/04/19 2151      PRN meds: acetaminophen **OR** acetaminophen, diphenoxylate-atropine, hydrALAZINE, labetalol, loperamide, oxyCODONE   Objective: Vitals:   11/06/19 0547 11/06/19 1424  BP: 108/70 122/79  Pulse: 89 95  Resp: 20 18  Temp: 98.1 F (36.7 C) 98.9 F (37.2 C)  SpO2: 100% 100%    Intake/Output Summary (Last 24 hours) at 11/06/2019 1715 Last data filed at 11/06/2019 0300 Gross per 24 hour  Intake 200 ml  Output --  Net 200 ml   Filed Weights   11/04/19 2033 11/05/19 0851  Weight: 82.6 kg 83.2 kg   Weight change: 0.68 kg Body mass index is 28.74 kg/m.   Physical Exam: General exam: Appears calm and comfortable.  Not in physical distress Skin: No rashes, lesions or  ulcers. HEENT: Atraumatic, normocephalic, supple neck, no obvious bleeding Lungs: Clear to auscultation bilaterally CVS: Regular rate and rhythm, no murmur GI/Abd soft, minimal tenderness improving, bowel sound present CNS: Alert, awake, oriented x3 Psychiatry: Mood appropriate Extremities: No pedal edema, no calf tenderness  Data Review: I have personally reviewed the laboratory data and studies available.  Recent Labs  Lab 11/04/19 1549 11/05/19 0409 11/06/19 0500  WBC 8.9 7.5 6.7  NEUTROABS 6.3 4.4 4.1  HGB 10.0* 8.0* 7.0*  HCT 29.3* 24.5* 21.3*  MCV 86.9 89.1 91.0  PLT 194 168 181   Recent Labs  Lab 11/04/19 1549 11/05/19 0409 11/06/19 0500  NA 136 134* 142  K 2.9* 3.2* 3.4*  CL 95* 99 108  CO2 28 26 24   GLUCOSE 113* 101* 84  BUN 9 5* <5*  CREATININE 0.80 0.65 0.73  CALCIUM 9.5 8.5* 8.6*  MG 1.6* 2.0 2.0  PHOS  --  2.1* 2.6    F/u labs ordered  Signed, Terrilee Croak, MD [facility] 11/06/2019

## 2019-11-07 LAB — CBC WITH DIFFERENTIAL/PLATELET
Abs Immature Granulocytes: 0.47 10*3/uL — ABNORMAL HIGH (ref 0.00–0.07)
Basophils Absolute: 0 10*3/uL (ref 0.0–0.1)
Basophils Relative: 0 %
Eosinophils Absolute: 0.7 10*3/uL — ABNORMAL HIGH (ref 0.0–0.5)
Eosinophils Relative: 10 %
HCT: 22.4 % — ABNORMAL LOW (ref 36.0–46.0)
Hemoglobin: 7.3 g/dL — ABNORMAL LOW (ref 12.0–15.0)
Immature Granulocytes: 7 %
Lymphocytes Relative: 19 %
Lymphs Abs: 1.3 10*3/uL (ref 0.7–4.0)
MCH: 29.6 pg (ref 26.0–34.0)
MCHC: 32.6 g/dL (ref 30.0–36.0)
MCV: 90.7 fL (ref 80.0–100.0)
Monocytes Absolute: 0.4 10*3/uL (ref 0.1–1.0)
Monocytes Relative: 6 %
Neutro Abs: 3.8 10*3/uL (ref 1.7–7.7)
Neutrophils Relative %: 58 %
Platelets: 213 10*3/uL (ref 150–400)
RBC: 2.47 MIL/uL — ABNORMAL LOW (ref 3.87–5.11)
RDW: 13.6 % (ref 11.5–15.5)
WBC: 6.7 10*3/uL (ref 4.0–10.5)
nRBC: 0 % (ref 0.0–0.2)

## 2019-11-07 LAB — BASIC METABOLIC PANEL
Anion gap: 9 (ref 5–15)
BUN: <5 mg/dL — ABNORMAL LOW (ref 6–20)
CO2: 24 mmol/L (ref 22–32)
Calcium: 8.7 mg/dL — ABNORMAL LOW (ref 8.9–10.3)
Chloride: 108 mmol/L (ref 98–111)
Creatinine, Ser: 0.66 mg/dL (ref 0.44–1.00)
GFR, Estimated: >60 mL/min (ref >60)
Glucose, Bld: 81 mg/dL (ref 70–99)
Potassium: 3.3 mmol/L — ABNORMAL LOW (ref 3.5–5.1)
Sodium: 141 mmol/L (ref 135–145)

## 2019-11-07 MED ORDER — POTASSIUM CHLORIDE CRYS ER 20 MEQ PO TBCR
40.0000 meq | EXTENDED_RELEASE_TABLET | Freq: Once | ORAL | Status: AC
  Administered 2019-11-07: 40 meq via ORAL
  Filled 2019-11-07: qty 2

## 2019-11-07 MED ORDER — ONDANSETRON HCL 4 MG/2ML IJ SOLN
4.0000 mg | Freq: Four times a day (QID) | INTRAMUSCULAR | Status: DC | PRN
  Administered 2019-11-08 – 2019-11-09 (×4): 4 mg via INTRAVENOUS
  Filled 2019-11-07 (×5): qty 2

## 2019-11-07 MED ORDER — POTASSIUM CHLORIDE 20 MEQ/15ML (10%) PO SOLN
40.0000 meq | Freq: Once | ORAL | Status: AC
  Administered 2019-11-07: 40 meq via ORAL
  Filled 2019-11-07: qty 30

## 2019-11-07 MED ORDER — DIPHENOXYLATE-ATROPINE 2.5-0.025 MG PO TABS
2.0000 | ORAL_TABLET | Freq: Four times a day (QID) | ORAL | Status: DC | PRN
  Administered 2019-11-07 (×2): 2 via ORAL
  Filled 2019-11-07 (×3): qty 2

## 2019-11-07 MED ORDER — LOPERAMIDE HCL 2 MG PO CAPS
4.0000 mg | ORAL_CAPSULE | Freq: Two times a day (BID) | ORAL | Status: DC | PRN
  Administered 2019-11-07 – 2019-11-09 (×4): 4 mg via ORAL
  Filled 2019-11-07 (×4): qty 2

## 2019-11-07 NOTE — Progress Notes (Signed)
[place] for Lovenox Indication: VTE, suspected iliac thrombus  Allergies  Allergen Reactions  . Cinnamon Other (See Comments)    Other reaction(s): Other (See Comments) Unknown  On allergy test Unknown  On allergy test  . Peanut-Containing Drug Products Hives  . Prednisone Other (See Comments)    Interacts with another medicine she is taking  . Corn Oil Hives  . Corn-Containing Products Hives  . Other Rash    Red grapefruit and naval oranges- lips tingling and facial rash Potatoes, tomatoes, garlic, oregano/basil caused headache    Patient Measurements: Height: 5\' 7"  (170.2 cm) Weight: 83.2 kg (183 lb 8 oz) IBW/kg (Calculated) : 61.6 Last documented height 67", weight 84.8 kg  Vital Signs: Temp: 98.3 F (36.8 C) (10/12 2229) Temp Source: Oral (10/12 2229) BP: 134/84 (10/12 2229) Pulse Rate: 90 (10/12 2229)  Labs: Recent Labs    11/05/19 0409 11/05/19 0409 11/06/19 0500 11/07/19 0544  HGB 8.0*   < > 7.0* 7.3*  HCT 24.5*  --  21.3* 22.4*  PLT 168  --  181 213  CREATININE 0.65  --  0.73 0.66   < > = values in this interval not displayed.    Estimated Creatinine Clearance: 84.9 mL/min (by C-G formula based on SCr of 0.66 mg/dL).   Medical History: Past Medical History:  Diagnosis Date  . Allergies    peanuts, corn, beans, red grapefruit, naval oranges  . Asthma    allergy shots and medication  . Cancer [facility])    pancreatic  . Collagen vascular disease ([place])   . DDD (degenerative disc disease), lumbar   . Eustachian tube dysfunction 12/2012   rhinitis, vertigo- Dr. Wilburn Cornelia, ENT   . Fibroids   . Heart murmur    Echo 1/18: EF 55-60, no RWMA, normal diastolic function, trivial AI, PASP 32  . History of cardiac catheterization    LHC 3/18: normal coronary arteries.   . History of nuclear stress test    ETT-Myoview 2/18: EF 62, + ECG response; apical and distal septal ischemia; intermediate risk.  [place] Kitchen Hypertension    . Iron deficiency anemia   . Lupus [facility]) 2011   Dr. Trudie Reed  . Migraine headache    trial of generic maxalt 10 mg, January 2021  . Obesity   . Seasonal allergies   . Seizure in childhood [facility])    as a child no treatment none x 30 years    Medications:  Scheduled:  . aspirin EC  81 mg Oral Daily  . Chlorhexidine Gluconate Cloth  6 each Topical Daily  . diclofenac  75 mg Oral BID  . enoxaparin (LOVENOX) injection  80 mg Subcutaneous Q12H  . feeding supplement (ENSURE ENLIVE)  237 mL Oral BID BM  . hydroxychloroquine  400 mg Oral Daily  . metoprolol tartrate  25 mg Oral BID  . ondansetron (ZOFRAN) IV  4 mg Intravenous Q6H  . potassium chloride  40 mEq Oral Once  . topiramate  50 mg Oral QHS   Infusions:  . sodium chloride 75 mL/hr at 11/07/19 0537  . metronidazole 500 mg (11/07/19 4580)    Assessment: 55 yoF admitted on 11/04/19 with N/V/D for 2 weeks after her first chemotherapy treatments on 9/29 for pancreatic cancer.  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 CT:  Filling defect in the LEFT internal iliac vein, likely a thrombus with differential considerations including mixing artifact. No prior to admission anticoagulation SCr <1 with  CrCl ~ 85 ml/min CBC:  Hgb low but stable d/t recent chemo and lupus No bleeding or complications reported.  Goal of Therapy:  Anti-Xa level 0.6-1 units/ml 4hrs after LMWH dose given Monitor platelets by anticoagulation protocol: Yes   Plan:  Continue Lovenox 1 mg/kg (80mg ) [place] q12h. Possible transition to [place] closer to discharge Will follow remotely Thank you for the consult   Ulice Dash, PharmD, BCPS [phone] 11/07/2019 7:53 AM

## 2019-11-07 NOTE — Progress Notes (Signed)
PROGRESS NOTE   Cynthia Frye  HAL:[id number]    DOB: [birth year]    DOA: 11/04/2019  PCP: Josetta Huddle, MD   I have briefly reviewed patients previous medical [facility]  Chief Complaint  Patient presents with  . Emesis    Brief Narrative:  59 year old female with PMH of pancreatic cancer, recently started on chemotherapy 2 weeks PTA, also with history of lupus, hypertension, chronic anemia, presented to the ED on 11/04/2019 with complaints of persistent nausea, vomiting, diarrhea, abdominal pain and poor oral intake that started after chemotherapy 2 weeks ago and have been progressively worsening.  She had presented to ED with similar complaints on 10/8 and was discharged home on antibiotics but she was unable to hold them down because of vomiting.  Of note, 10/5, patient had biopsy of liver lesions for suspicious metastasis.  Admitted for pancolitis and left iliac vein DVT.  Slowly improving.   Assessment & Plan:  Principal Problem:   Colitis Active Problems:   Essential hypertension   Lupus (systemic lupus erythematosus) (HCC)   Pancreatic cancer (HCC)   Acute deep vein thrombosis of left iliac vein (HCC)   Hypokalemia   Pancolitis:  CT abdomen and pelvis showed circumferential bowel wall thickening with adjacent fat stranding throughout the colon, most predominant in the transverse colon.  Findings consistent with colitis.  Suspecting chemo therapy induced colitis and diarrhea.  Nausea vomiting have improved, tolerating diet but still having lots of diarrhea.  Treat supportively with IVF, until oral intake consistently up, replace electrolytes as needed, antiemetics and Lomotil.  Empiric antibiotics were discontinued since infectious etiology was felt less likely.  SIRS/sinus tachycardia  Secondary to above.  Resolved.  Currently in sinus rhythm on telemetry.  Left iliac vein DVT:   Noted on CT abdomen and pelvis.  Currently on full dose  Lovenox with plans to switch over to DOAC at discharge.  Hypokalemia/hypomagnesemia/hypophosphatemia  Secondary to ongoing GI losses/diarrhea.  Replace as needed and follow closely.  Essential hypertension:  Continue Toprol, improved BP control.  Holding HCTZ and losartan.  Metastatic pancreatic cancer  Follows with Dr. Burr Medico, oncology.  Last chemo 2 weeks ago.  10/5, CT liver biopsy to rule out meta stasis.  CT abdomen on admission showed innumerable hypodense masses throughout the liver increased in conspicuity worrisome for metastatic disease.  History of lupus  Presently on Plaquenil and diclofenac.  She was on methotrexate previously which was held before chemotherapy.  Anemia of chronic disease  Stable in the low 7 g range.  Follow CBC and transfuse if hemoglobin 7 g or less.  Body mass index is 28.74 kg/m.  Nutritional Status Nutrition Problem: Increased nutrient needs Etiology: cancer and cancer related treatments Signs/Symptoms: estimated needs Interventions: Ensure Enlive (each supplement provides 350kcal and 20 grams of protein)  DVT prophylaxis:      Code Status: Full Code  Family Communication:  Disposition:  Status is: Inpatient  Remains inpatient appropriate because:Inpatient level of care appropriate due to severity of illness   Dispo: The patient is from: Home              Anticipated d/c is to: Home              Anticipated d/c date is: 2 days              Patient currently is not medically stable to d/c.        Consultants:     Procedures:  Antimicrobials:    Anti-infectives (From admission, onward)   Start     Dose/Rate Route Frequency Ordered Stop   11/05/19 2200  cefTRIAXone (ROCEPHIN) 2 g in sodium chloride 0.9 % 100 mL IVPB  Status:  Discontinued        2 g 200 mL/hr over 30 Minutes Intravenous Every 24 hours 11/04/19 2126 11/06/19 1713   11/05/19 1000  hydroxychloroquine (PLAQUENIL) tablet 400 mg        400 mg Oral Daily  11/04/19 2124     11/05/19 0400  metroNIDAZOLE (FLAGYL) IVPB 500 mg        500 mg 100 mL/hr over 60 Minutes Intravenous Every 8 hours 11/04/19 2126     11/04/19 2015  cefTRIAXone (ROCEPHIN) 2 g in sodium chloride 0.9 % 100 mL IVPB        2 g 200 mL/hr over 30 Minutes Intravenous  Once 11/04/19 2003 11/04/19 2238   11/04/19 2000  metroNIDAZOLE (FLAGYL) IVPB 500 mg        500 mg 100 mL/hr over 60 Minutes Intravenous  Once 11/04/19 1958 11/04/19 2151        Subjective:  Feels somewhat better.  No nausea or vomiting.  Able to tolerate some soft diet this morning.  Did not eat much yesterday.  Still having diarrhea, even every time she goes to urinate, watery but unable to quantify.  Had 2 BMs by the time I saw her this morning.  No abdominal pain.  Denies chest pain, dyspnea, dizziness or lightheadedness.  Objective:   Vitals:   11/06/19 0033 11/06/19 0547 11/06/19 1424 11/06/19 2229  BP: 118/70 108/70 122/79 134/84  Pulse: 94 89 95 90  Resp:  20 18 20   Temp:  98.1 F (36.7 C) 98.9 F (37.2 C) 98.3 F (36.8 C)  TempSrc:  Oral Oral Oral  SpO2:  100% 100% 100%  Weight:      Height:        General exam: Pleasant young female, moderately built and nourished sitting up comfortably in bed without distress. Respiratory system: Clear to auscultation. Respiratory effort normal. Cardiovascular system: S1 & S2 heard, RRR. No JVD, murmurs, rubs, gallops or clicks. No pedal edema.  Telemetry sinus rhythm. Gastrointestinal system: Abdomen is nondistended, soft and nontender. No organomegaly or masses felt. Normal bowel sounds heard. Central nervous system: Alert and oriented. No focal neurological deficits. Extremities: Symmetric 5 x 5 power. Skin: No rashes, lesions or ulcers Psychiatry: Judgement and insight appear normal. Mood & affect appropriate.     Data Reviewed:   I have personally reviewed following labs and imaging studies   CBC: Recent Labs  Lab 11/05/19 0409  11/06/19 0500 11/07/19 0544  WBC 7.5 6.7 6.7  NEUTROABS 4.4 4.1 3.8  HGB 8.0* 7.0* 7.3*  HCT 24.5* 21.3* 22.4*  MCV 89.1 91.0 90.7  PLT 168 181 518    Basic Metabolic Panel: Recent Labs  Lab 11/04/19 1549 11/04/19 1549 11/05/19 0409 11/06/19 0500 11/07/19 0544  NA 136   < > 134* 142 141  K 2.9*   < > 3.2* 3.4* 3.3*  CL 95*   < > 99 108 108  CO2 28   < > 26 24 24   GLUCOSE 113*   < > 101* 84 81  BUN 9   < > 5* <5* <5*  CREATININE 0.80   < > 0.65 0.73 0.66  CALCIUM 9.5   < > 8.5* 8.6* 8.7*  MG 1.6*  --  2.0 2.0  --  PHOS  --   --  2.1* 2.6  --    < > = values in this interval not displayed.    Liver Function Tests: Recent Labs  Lab 11/04/19 1549 11/05/19 0409  AST 31 24  ALT 33 26  ALKPHOS 111 81  BILITOT 0.5 0.4  PROT 7.5 6.0*  ALBUMIN 3.5 2.8*    CBG: No results for input(s): GLUCAP in the last 168 hours.  Microbiology Studies:   Recent Results (from the past 240 hour(s))  Blood culture (routine x 2)     Status: None   Collection Time: 10/28/19 10:13 PM   Specimen: BLOOD  Result Value Ref Range Status   Specimen Description   Final    BLOOD LEFT ANTECUBITAL Performed at [facility] [address] [place], [place] [address]    Special Requests   Final    BOTTLES DRAWN AEROBIC AND ANAEROBIC Blood Culture adequate volume Performed at [facility] [address], [place], [place] [address]    Culture   Final    NO GROWTH 5 DAYS Performed at [facility], [place] [address], [place], [place] [address]    Report Status 11/03/2019 FINAL  Final  Blood culture (routine x 2)     Status: None   Collection Time: 10/28/19 10:14 PM   Specimen: BLOOD  Result Value Ref Range Status   Specimen Description   Final    BLOOD PORTA CATH Performed at [facility] [address], [place], [place] [address]    Special Requests   Final    BOTTLES DRAWN AEROBIC AND ANAEROBIC Blood Culture adequate  volume Performed at [facility] [address] [place], [place] [address]    Culture   Final    NO GROWTH 5 DAYS Performed at [facility], [place] [address], [place], [place] [address]    Report Status 11/03/2019 FINAL  Final  Respiratory Panel by RT PCR (Flu A&B, Covid) - Nasopharyngeal Swab     Status: None   Collection Time: 11/04/19  8:35 PM   Specimen: Nasopharyngeal Swab  Result Value Ref Range Status   SARS Coronavirus 2 by RT PCR NEGATIVE NEGATIVE Final    Comment: (NOTE) SARS-CoV-2 target nucleic acids are NOT DETECTED.  The SARS-CoV-2 RNA is generally detectable in upper respiratoy specimens during the acute phase of infection. The lowest concentration of SARS-CoV-2 viral copies this assay can detect is 131 copies/mL. A negative result does not preclude SARS-Cov-2 infection and should not be used as the sole basis for treatment or other patient management decisions. A negative result may occur with  improper specimen collection/handling, submission of specimen other than nasopharyngeal swab, presence of viral mutation(s) within the areas targeted by this assay, and inadequate number of viral copies (<131 copies/mL). A negative result must be combined with clinical observations, patient history, and epidemiological information. The expected result is Negative.  Fact Sheet for Patients:  PinkCheek.be  Fact Sheet for Healthcare Providers:  GravelBags.it  This test is no t yet approved or cleared by the [place] FDA and  has been authorized for detection and/or diagnosis of SARS-CoV-2 by FDA under an Emergency Use Authorization (EUA). This EUA will remain  in effect (meaning this test can be used) for the duration of the COVID-19 declaration under Section 564(b)(1) of the Act, 21 U.S.C. section 360bbb-3(b)(1), unless the authorization is terminated or revoked sooner.     Influenza  A  by PCR NEGATIVE NEGATIVE Final   Influenza B by PCR NEGATIVE NEGATIVE Final    Comment: (NOTE) The Xpert Xpress SARS-CoV-2/FLU/RSV assay is intended as an aid in  the diagnosis of influenza from Nasopharyngeal swab specimens and  should not be used as a sole basis for treatment. Nasal washings and  aspirates are unacceptable for Xpert Xpress SARS-CoV-2/FLU/RSV  testing.  Fact Sheet for Patients: PinkCheek.be  Fact Sheet for Healthcare Providers: GravelBags.it  This test is not yet approved or cleared by the [place] FDA and  has been authorized for detection and/or diagnosis of SARS-CoV-2 by  FDA under an Emergency Use Authorization (EUA). This EUA will remain  in effect (meaning this test can be used) for the duration of the  Covid-19 declaration under Section 564(b)(1) of the Act, 21  U.S.C. section 360bbb-3(b)(1), unless the authorization is  terminated or revoked. Performed at [facility], [place] [address], [place], [place] [address]   Culture, blood (routine x 2)     Status: None (Preliminary result)   Collection Time: 11/04/19  8:42 PM   Specimen: Porta Cath; Blood  Result Value Ref Range Status   Specimen Description   Final    PORTA CATH BLOOD Performed at [facility] [address], [place], [place] [address]    Special Requests   Final    BOTTLES DRAWN AEROBIC AND ANAEROBIC Blood Culture adequate volume Performed at [facility] [address], [place], [place] [address]    Culture   Final    NO GROWTH 2 DAYS Performed at [facility] [address], [place], [place] [address]    Report Status PENDING  Incomplete  Gastrointestinal Panel by PCR , Stool     Status: None   Collection Time: 11/05/19  1:00 PM   Specimen: Stool  Result Value Ref Range Status   Campylobacter species NOT DETECTED NOT DETECTED Final   Plesimonas shigelloides NOT DETECTED  NOT DETECTED Final   Salmonella species NOT DETECTED NOT DETECTED Final   Yersinia enterocolitica NOT DETECTED NOT DETECTED Final   Vibrio species NOT DETECTED NOT DETECTED Final   Vibrio cholerae NOT DETECTED NOT DETECTED Final   Enteroaggregative E coli (EAEC) NOT DETECTED NOT DETECTED Final   Enteropathogenic E coli (EPEC) NOT DETECTED NOT DETECTED Final   Enterotoxigenic E coli (ETEC) NOT DETECTED NOT DETECTED Final   Shiga like toxin producing E coli (STEC) NOT DETECTED NOT DETECTED Final   Shigella/Enteroinvasive E coli (EIEC) NOT DETECTED NOT DETECTED Final   Cryptosporidium NOT DETECTED NOT DETECTED Final   Cyclospora cayetanensis NOT DETECTED NOT DETECTED Final   Entamoeba histolytica NOT DETECTED NOT DETECTED Final   Giardia lamblia NOT DETECTED NOT DETECTED Final   Adenovirus F40/41 NOT DETECTED NOT DETECTED Final   Astrovirus NOT DETECTED NOT DETECTED Final   Norovirus GI/GII NOT DETECTED NOT DETECTED Final   Rotavirus A NOT DETECTED NOT DETECTED Final   Sapovirus (I, II, IV, and V) NOT DETECTED NOT DETECTED Final    Comment: Performed at [facility], [address] [place], [place] [address]  C Difficile Quick Screen w PCR reflex     Status: None   Collection Time: 11/05/19  1:00 PM   Specimen: STOOL  Result Value Ref Range Status   C Diff antigen NEGATIVE NEGATIVE Final   C Diff toxin NEGATIVE NEGATIVE Final   C Diff interpretation No C. difficile detected.  Final    Comment: Performed  at [facility], [place] [address], [place], [place] [address]     Radiology Studies:  No results found.   Scheduled Meds:   . aspirin EC  81 mg Oral Daily  . Chlorhexidine Gluconate Cloth  6 each Topical Daily  . diclofenac  75 mg Oral BID  . enoxaparin (LOVENOX) injection  80 mg Subcutaneous Q12H  . feeding supplement  237 mL Oral BID BM  . hydroxychloroquine  400 mg Oral Daily  . metoprolol tartrate  25 mg Oral BID  . topiramate  50 mg Oral QHS     Continuous Infusions:   . sodium chloride 75 mL/hr at 11/07/19 0537  . metronidazole 500 mg (11/07/19 1247)     LOS: 3 days     Vernell Leep, MD, [place], [facility]. [facility]    To contact the attending provider between 7A-7P or the covering provider during after hours 7P-7A, please log into the web site [email or web address] and access using universal [place] password for that web site. If you do not have the password, please call the hospital operator.  11/07/2019, 7:20 PM

## 2019-11-07 NOTE — TOC Benefit Eligibility Note (Signed)
Transition of Care [facility]) Benefit Eligibility Note    Patient Details  Name: Cynthia Frye MRN: [id number] Date of Birth: 1960-11-05   Medication/Dose: Eliquis and Xarelto 2x day for 30 days  Covered?: Yes  Tier: 3 Drug  Prescription Coverage Preferred Pharmacy: local  Spoke with Person/Company/Phone Number:: [facility] [employer] automated system [phone]  Co-Pay: $0.00  Prior Approval: No  Deductible: Met       Kerin Salen Phone Number: 11/07/2019, 10:42 AM

## 2019-11-08 ENCOUNTER — Other Ambulatory Visit: Payer: BC Managed Care – PPO

## 2019-11-08 ENCOUNTER — Ambulatory Visit: Payer: BC Managed Care – PPO | Admitting: Hematology

## 2019-11-08 ENCOUNTER — Ambulatory Visit: Payer: BC Managed Care – PPO

## 2019-11-08 DIAGNOSIS — K529 Noninfective gastroenteritis and colitis, unspecified: Secondary | ICD-10-CM | POA: Diagnosis not present

## 2019-11-08 LAB — BASIC METABOLIC PANEL
Anion gap: 8 (ref 5–15)
BUN: <5 mg/dL — ABNORMAL LOW (ref 6–20)
CO2: 23 mmol/L (ref 22–32)
Calcium: 8.4 mg/dL — ABNORMAL LOW (ref 8.9–10.3)
Chloride: 106 mmol/L (ref 98–111)
Creatinine, Ser: 0.6 mg/dL (ref 0.44–1.00)
GFR, Estimated: >60 mL/min (ref >60)
Glucose, Bld: 81 mg/dL (ref 70–99)
Potassium: 3.3 mmol/L — ABNORMAL LOW (ref 3.5–5.1)
Sodium: 137 mmol/L (ref 135–145)

## 2019-11-08 LAB — CBC WITH DIFFERENTIAL/PLATELET
Abs Immature Granulocytes: 0.62 10*3/uL — ABNORMAL HIGH (ref 0.00–0.07)
Basophils Absolute: 0 10*3/uL (ref 0.0–0.1)
Basophils Relative: 0 %
Eosinophils Absolute: 1.1 10*3/uL — ABNORMAL HIGH (ref 0.0–0.5)
Eosinophils Relative: 17 %
HCT: 23.3 % — ABNORMAL LOW (ref 36.0–46.0)
Hemoglobin: 7.6 g/dL — ABNORMAL LOW (ref 12.0–15.0)
Immature Granulocytes: 9 %
Lymphocytes Relative: 18 %
Lymphs Abs: 1.2 10*3/uL (ref 0.7–4.0)
MCH: 29.2 pg (ref 26.0–34.0)
MCHC: 32.6 g/dL (ref 30.0–36.0)
MCV: 89.6 fL (ref 80.0–100.0)
Monocytes Absolute: 0.4 10*3/uL (ref 0.1–1.0)
Monocytes Relative: 6 %
Neutro Abs: 3.4 10*3/uL (ref 1.7–7.7)
Neutrophils Relative %: 50 %
Platelets: 244 10*3/uL (ref 150–400)
RBC: 2.6 MIL/uL — ABNORMAL LOW (ref 3.87–5.11)
RDW: 13.6 % (ref 11.5–15.5)
WBC: 6.7 10*3/uL (ref 4.0–10.5)
nRBC: 0.3 % — ABNORMAL HIGH (ref 0.0–0.2)

## 2019-11-08 LAB — PHOSPHORUS: Phosphorus: 2.6 mg/dL (ref 2.5–4.6)

## 2019-11-08 LAB — MAGNESIUM: Magnesium: 1.7 mg/dL (ref 1.7–2.4)

## 2019-11-08 MED ORDER — POTASSIUM CHLORIDE 20 MEQ/15ML (10%) PO SOLN
40.0000 meq | ORAL | Status: AC
  Administered 2019-11-08 (×3): 40 meq via ORAL
  Filled 2019-11-08 (×3): qty 30

## 2019-11-08 MED ORDER — POTASSIUM CHLORIDE CRYS ER 20 MEQ PO TBCR
40.0000 meq | EXTENDED_RELEASE_TABLET | ORAL | Status: DC
  Filled 2019-11-08: qty 2

## 2019-11-08 NOTE — Progress Notes (Signed)
PROGRESS NOTE   TAMITHA NORELL  BWL:[id number]    DOB: 1960-03-19    DOA: 11/04/2019  PCP: Josetta Huddle, MD   I have briefly reviewed patients previous medical records in [facility].  Chief Complaint  Patient presents with  . Emesis    Brief Narrative:  59 year old female with PMH of pancreatic cancer, recently started on chemotherapy 2 weeks PTA, also with history of lupus, hypertension, chronic anemia, presented to the ED on 11/04/2019 with complaints of persistent nausea, vomiting, diarrhea, abdominal pain and poor oral intake that started after chemotherapy 2 weeks ago and have been progressively worsening.  She had presented to ED with similar complaints on 10/8 and was discharged home on antibiotics but she was unable to hold them down because of vomiting.  Of note, 10/5, patient had biopsy of liver lesions for suspicious metastasis.  Admitted for pancolitis and left iliac vein DVT.  Slowly improving.   Assessment & Plan:  Principal Problem:   Colitis Active Problems:   Essential hypertension   Lupus (systemic lupus erythematosus) (HCC)   Pancreatic cancer (HCC)   Acute deep vein thrombosis of left iliac vein (HCC)   Hypokalemia   Pancolitis:  CT abdomen and pelvis showed circumferential bowel wall thickening with adjacent fat stranding throughout the colon, most predominant in the transverse colon.  Findings consistent with colitis.  Suspecting chemo therapy induced colitis and diarrhea.  She developed significant nausea, vomiting and diarrhea after first cycle chemotherapy with FOLFIRINOX.   C. difficile and GI panel PCR negative.  Nausea and vomiting have improved but oral intake is inconsistent and poor.  Diarrhea has improved.  Treating supportively with IVF, until oral intake consistently up, replace electrolytes as needed, antiemetics and Lomotil.  Hopefully she can get well enough to DC home tomorrow.  Empiric antibiotics were discontinued since  infectious etiology was felt less likely.  SIRS/sinus tachycardia  Secondary to above.  Resolved.  Currently in sinus rhythm on telemetry.  Left iliac vein DVT:   Noted on CT abdomen and pelvis.  Currently on full dose Lovenox with plans to switch over to DOAC at discharge.  I discussed in detail with patient regarding options of continued full dose Lovenox versus oral anticoagulation including warfarin, Pradaxa, Eliquis and Xarelto.  Went over in detail regarding risks and benefits of anticoagulation, adverse effects of these medications, management etc.  She was open to the idea of continuing full dose Lovenox and she has given methotrexate parenterally in the past.  However it might be more convenient to take oral medication rather than injectables daily.  She was open to the idea of oral anticoagulation.  She wishes to think about this.  I also updated Dr. Burr Medico regarding the discussion who plans to meet with her later this afternoon  Hypokalemia/hypomagnesemia/hypophosphatemia  Secondary to ongoing GI losses/diarrhea.  Persistent hypokalemia, mild.  Replace aggressively today and follow in a.m.  Essential hypertension:  Continue Toprol, improved BP control.  Holding HCTZ and losartan.  Metastatic pancreatic cancer  Follows with Dr. Burr Medico, oncology.  Last chemo 2 weeks ago.  10/5, CT liver biopsy confirmed metastatic pancreatic cancer.  Dr. Burr Medico plans to cancel her second cycle chemotherapy this week and postpone it to 1 to 2 weeks until she recovers well.  CT abdomen on admission showed innumerable hypodense masses throughout the liver increased in conspicuity worrisome for metastatic disease.  History of lupus  Presently on Plaquenil and diclofenac.  She was on methotrexate previously which was held  before chemotherapy.  Anemia of chronic disease  Stable in the low 7 g range.  Follow CBC and transfuse if hemoglobin 7 g or less.  Body mass index is 28.74 kg/m.   Nutritional Status Nutrition Problem: Increased nutrient needs Etiology: cancer and cancer related treatments Signs/Symptoms: estimated needs Interventions: Ensure Enlive (each supplement provides 350kcal and 20 grams of protein)  DVT prophylaxis:      Code Status: Full Code  Family Communication:  Disposition:  Status is: Inpatient  Remains inpatient appropriate because:Inpatient level of care appropriate due to severity of illness   Dispo: The patient is from: Home              Anticipated d/c is to: Home              Anticipated d/c date is: 2 days              Patient currently is not medically stable to d/c.        Consultants:   Oncology  Procedures:   None  Antimicrobials:    Anti-infectives (From admission, onward)   Start     Dose/Rate Route Frequency Ordered Stop   11/05/19 2200  cefTRIAXone (ROCEPHIN) 2 g in sodium chloride 0.9 % 100 mL IVPB  Status:  Discontinued        2 g 200 mL/hr over 30 Minutes Intravenous Every 24 hours 11/04/19 2126 11/06/19 1713   11/05/19 1000  hydroxychloroquine (PLAQUENIL) tablet 400 mg        400 mg Oral Daily 11/04/19 2124     11/05/19 0400  metroNIDAZOLE (FLAGYL) IVPB 500 mg  Status:  Discontinued        500 mg 100 mL/hr over 60 Minutes Intravenous Every 8 hours 11/04/19 2126 11/07/19 1933   11/04/19 2015  cefTRIAXone (ROCEPHIN) 2 g in sodium chloride 0.9 % 100 mL IVPB        2 g 200 mL/hr over 30 Minutes Intravenous  Once 11/04/19 2003 11/04/19 2238   11/04/19 2000  metroNIDAZOLE (FLAGYL) IVPB 500 mg        500 mg 100 mL/hr over 60 Minutes Intravenous  Once 11/04/19 1958 11/04/19 2151        Subjective:  Inconsistent oral intake.  Mild nausea but no vomiting.  States that she slept well last night.  Had an episode of diarrhea around 11 PM last night and once this morning.  Anxious about discharge and wants to make sure that she is eating well consistently, her lab work are optimized.  Reported intermittent lower  sternal pain, brought on only with deep inspiration or chest wall movements.  Feels that she may have pulled a muscle while raising herself off the bed which also brings on this pain.  Explained that this is likely musculoskeletal etiology  Objective:   Vitals:   11/06/19 2229 11/07/19 2127 11/08/19 0603 11/08/19 1335  BP: 134/84 135/81 127/80 (!) 145/88  Pulse: 90 87 76 82  Resp: 20   18  Temp: 98.3 F (36.8 C) 98.2 F (36.8 C) 98 F (36.7 C) 98.3 F (36.8 C)  TempSrc: Oral Oral Oral Oral  SpO2: 100% 98% 98% 100%  Weight:      Height:        General exam: Pleasant young female, moderately built and nourished sitting up comfortably in bed without distress.  Oral mucosa moist. Respiratory system: Clear to auscultation. Respiratory effort normal. Cardiovascular system: S1 & S2 heard, RRR. No JVD, murmurs, rubs, gallops  or clicks. No pedal edema.  Telemetry personally reviewed: Sinus rhythm.  Occasional mild sinus tachycardia. Gastrointestinal system: Abdomen is nondistended, soft and nontender. No organomegaly or masses felt. Normal bowel sounds heard. Central nervous system: Alert and oriented. No focal neurological deficits. Extremities: Symmetric 5 x 5 power. Skin: No rashes, lesions or ulcers Psychiatry: Judgement and insight appear normal. Mood & affect appropriate.     Data Reviewed:   I have personally reviewed following labs and imaging studies   CBC: Recent Labs  Lab 11/06/19 0500 11/07/19 0544 11/08/19 0619  WBC 6.7 6.7 6.7  NEUTROABS 4.1 3.8 3.4  HGB 7.0* 7.3* 7.6*  HCT 21.3* 22.4* 23.3*  MCV 91.0 90.7 89.6  PLT 181 213 338    Basic Metabolic Panel: Recent Labs  Lab 11/04/19 1549 11/04/19 1549 11/05/19 0409 11/05/19 0409 11/06/19 0500 11/07/19 0544 11/08/19 0619  NA 136   < > 134*   < > 142 141 137  K 2.9*   < > 3.2*   < > 3.4* 3.3* 3.3*  CL 95*   < > 99   < > 108 108 106  CO2 28   < > 26   < > 24 24 23   GLUCOSE 113*   < > 101*   < > 84 81 81   BUN 9   < > 5*   < > <5* <5* <5*  CREATININE 0.80   < > 0.65   < > 0.73 0.66 0.60  CALCIUM 9.5   < > 8.5*   < > 8.6* 8.7* 8.4*  MG 1.6*  --  2.0  --  2.0  --   --   PHOS  --   --  2.1*  --  2.6  --   --    < > = values in this interval not displayed.    Liver Function Tests: Recent Labs  Lab 11/04/19 1549 11/05/19 0409  AST 31 24  ALT 33 26  ALKPHOS 111 81  BILITOT 0.5 0.4  PROT 7.5 6.0*  ALBUMIN 3.5 2.8*    CBG: No results for input(s): GLUCAP in the last 168 hours.  Microbiology Studies:   Recent Results (from the past 240 hour(s))  Respiratory Panel by RT PCR (Flu A&B, Covid) - Nasopharyngeal Swab     Status: None   Collection Time: 11/04/19  8:35 PM   Specimen: Nasopharyngeal Swab  Result Value Ref Range Status   SARS Coronavirus 2 by RT PCR NEGATIVE NEGATIVE Final    Comment: (NOTE) SARS-CoV-2 target nucleic acids are NOT DETECTED.  The SARS-CoV-2 RNA is generally detectable in upper respiratoy specimens during the acute phase of infection. The lowest concentration of SARS-CoV-2 viral copies this assay can detect is 131 copies/mL. A negative result does not preclude SARS-Cov-2 infection and should not be used as the sole basis for treatment or other patient management decisions. A negative result may occur with  improper specimen collection/handling, submission of specimen other than nasopharyngeal swab, presence of viral mutation(s) within the areas targeted by this assay, and inadequate number of viral copies (<131 copies/mL). A negative result must be combined with clinical observations, patient history, and epidemiological information. The expected result is Negative.  Fact Sheet for Patients:  PinkCheek.be  Fact Sheet for Healthcare Providers:  GravelBags.it  This test is no t yet approved or cleared by the [place] FDA and  has been authorized for detection and/or diagnosis of SARS-CoV-2  by FDA under an Emergency Use Authorization (  EUA). This EUA will remain  in effect (meaning this test can be used) for the duration of the COVID-19 declaration under Section 564(b)(1) of the Act, 21 U.S.C. section 360bbb-3(b)(1), unless the authorization is terminated or revoked sooner.     Influenza A by PCR NEGATIVE NEGATIVE Final   Influenza B by PCR NEGATIVE NEGATIVE Final    Comment: (NOTE) The Xpert Xpress SARS-CoV-2/FLU/RSV assay is intended as an aid in  the diagnosis of influenza from Nasopharyngeal swab specimens and  should not be used as a sole basis for treatment. Nasal washings and  aspirates are unacceptable for Xpert Xpress SARS-CoV-2/FLU/RSV  testing.  Fact Sheet for Patients: PinkCheek.be  Fact Sheet for Healthcare Providers: GravelBags.it  This test is not yet approved or cleared by the [place] FDA and  has been authorized for detection and/or diagnosis of SARS-CoV-2 by  FDA under an Emergency Use Authorization (EUA). This EUA will remain  in effect (meaning this test can be used) for the duration of the  Covid-19 declaration under Section 564(b)(1) of the Act, 21  U.S.C. section 360bbb-3(b)(1), unless the authorization is  terminated or revoked. Performed at [facility], [place] [address] [place], [place] [address]   Culture, blood (routine x 2)     Status: None (Preliminary result)   Collection Time: 11/04/19  8:42 PM   Specimen: Porta Cath; Blood  Result Value Ref Range Status   Specimen Description   Final    PORTA CATH BLOOD Performed at [facility] [address] [place], [place] [address]    Special Requests   Final    BOTTLES DRAWN AEROBIC AND ANAEROBIC Blood Culture adequate volume Performed at [facility] [address] [place], [place] [address]    Culture   Final    NO GROWTH 3 DAYS Performed at [facility], [place]  [address], [place], [place] [address]    Report Status PENDING  Incomplete  Gastrointestinal Panel by PCR , Stool     Status: None   Collection Time: 11/05/19  1:00 PM   Specimen: Stool  Result Value Ref Range Status   Campylobacter species NOT DETECTED NOT DETECTED Final   Plesimonas shigelloides NOT DETECTED NOT DETECTED Final   Salmonella species NOT DETECTED NOT DETECTED Final   Yersinia enterocolitica NOT DETECTED NOT DETECTED Final   Vibrio species NOT DETECTED NOT DETECTED Final   Vibrio cholerae NOT DETECTED NOT DETECTED Final   Enteroaggregative E coli (EAEC) NOT DETECTED NOT DETECTED Final   Enteropathogenic E coli (EPEC) NOT DETECTED NOT DETECTED Final   Enterotoxigenic E coli (ETEC) NOT DETECTED NOT DETECTED Final   Shiga like toxin producing E coli (STEC) NOT DETECTED NOT DETECTED Final   Shigella/Enteroinvasive E coli (EIEC) NOT DETECTED NOT DETECTED Final   Cryptosporidium NOT DETECTED NOT DETECTED Final   Cyclospora cayetanensis NOT DETECTED NOT DETECTED Final   Entamoeba histolytica NOT DETECTED NOT DETECTED Final   Giardia lamblia NOT DETECTED NOT DETECTED Final   Adenovirus F40/41 NOT DETECTED NOT DETECTED Final   Astrovirus NOT DETECTED NOT DETECTED Final   Norovirus GI/GII NOT DETECTED NOT DETECTED Final   Rotavirus A NOT DETECTED NOT DETECTED Final   Sapovirus (I, II, IV, and V) NOT DETECTED NOT DETECTED Final    Comment: Performed at [facility], [address] [place], [place] [address]  C Difficile Quick Screen w PCR reflex     Status: None   Collection Time: 11/05/19  1:00 PM   Specimen: STOOL  Result Value Ref Range Status   C Diff antigen NEGATIVE NEGATIVE Final   C Diff toxin NEGATIVE NEGATIVE Final   C Diff interpretation No C. difficile detected.  Final    Comment: Performed at [facility], [place] [address], [place], [place] [address]     Radiology Studies:  No results found.   Scheduled Meds:   . aspirin EC  81 mg Oral  Daily  . Chlorhexidine Gluconate Cloth  6 each Topical Daily  . diclofenac  75 mg Oral BID  . enoxaparin (LOVENOX) injection  80 mg Subcutaneous Q12H  . feeding supplement  237 mL Oral BID BM  . hydroxychloroquine  400 mg Oral Daily  . metoprolol tartrate  25 mg Oral BID  . potassium chloride  40 mEq Oral Q4H  . topiramate  50 mg Oral QHS    Continuous Infusions:   . sodium chloride 75 mL/hr at 11/08/19 1111     LOS: 4 days     Vernell Leep, MD, [place], [facility]. [facility]    To contact the attending provider between 7A-7P or the covering provider during after hours 7P-7A, please log into the web site [email or web address] and access using universal [place] password for that web site. If you do not have the password, please call the hospital operator.  11/08/2019, 4:20 PM

## 2019-11-08 NOTE — Progress Notes (Signed)
Cynthia Frye   DOB:1960/08/22   RA#:[id number]   [id number]  Oncology follow up   Subjective: Patient overall is doing better, tolerating soft diet better, had a 2 loose bowel movement today, energy level also improved.  No bleeding, vital signs stable.   Objective:  Vitals:   11/08/19 0603 11/08/19 1335  BP: 127/80 (!) 145/88  Pulse: 76 82  Resp:  18  Temp: 98 F (36.7 C) 98.3 F (36.8 C)  SpO2: 98% 100%    Body mass index is 28.74 kg/m.  Intake/Output Summary (Last 24 hours) at 11/08/2019 1711 Last data filed at 11/08/2019 5456 Gross per 24 hour  Intake 1505.86 ml  Output --  Net 1505.86 ml     Sclerae unicteric  Oropharynx clear  No peripheral adenopathy  Lungs clear -- no rales or rhonchi  Heart regular rate and rhythm  Abdomen soft   MSK no focal spinal tenderness, no peripheral edema  Neuro nonfocal   CBG (last 3)  No results for input(s): GLUCAP in the last 72 hours.   Labs:  Urine Studies No results for input(s): UHGB, CRYS in the last 72 hours.  Invalid input(s): UACOL, UAPR, USPG, UPH, UTP, UGL, UKET, UBIL, UNIT, UROB, ULEU, UEPI, UWBC, URBC, UBAC, CAST, [place], [place]  Basic Metabolic Panel: Recent Labs  Lab 11/04/19 1549 11/04/19 1549 11/05/19 0409 11/05/19 0409 11/06/19 0500 11/06/19 0500 11/07/19 0544 11/08/19 0619  NA 136  --  134*  --  142  --  141 137  K 2.9*   < > 3.2*   < > 3.4*   < > 3.3* 3.3*  CL 95*  --  99  --  108  --  108 106  CO2 28  --  26  --  24  --  24 23  GLUCOSE 113*  --  101*  --  84  --  81 81  BUN 9  --  5*  --  <5*  --  <5* <5*  CREATININE 0.80  --  0.65  --  0.73  --  0.66 0.60  CALCIUM 9.5  --  8.5*  --  8.6*  --  8.7* 8.4*  MG 1.6*  --  2.0  --  2.0  --   --   --   PHOS  --   --  2.1*  --  2.6  --   --   --    < > = values in this interval not displayed.   GFR Estimated Creatinine Clearance: 84.9 mL/min (by C-G formula based on SCr of 0.6 mg/dL). Liver Function Tests: Recent Labs  Lab 11/04/19 1549  11/05/19 0409  AST 31 24  ALT 33 26  ALKPHOS 111 81  BILITOT 0.5 0.4  PROT 7.5 6.0*  ALBUMIN 3.5 2.8*   Recent Labs  Lab 11/04/19 1549  LIPASE 19   No results for input(s): AMMONIA in the last 168 hours. Coagulation profile No results for input(s): INR, PROTIME in the last 168 hours.  CBC: Recent Labs  Lab 11/04/19 1549 11/05/19 0409 11/06/19 0500 11/07/19 0544 11/08/19 0619  WBC 8.9 7.5 6.7 6.7 6.7  NEUTROABS 6.3 4.4 4.1 3.8 3.4  HGB 10.0* 8.0* 7.0* 7.3* 7.6*  HCT 29.3* 24.5* 21.3* 22.4* 23.3*  MCV 86.9 89.1 91.0 90.7 89.6  PLT 194 168 181 213 244   Cardiac Enzymes: No results for input(s): CKTOTAL, CKMB, CKMBINDEX, TROPONINI in the last 168 hours. BNP: Invalid input(s): POCBNP CBG: No results for input(s):  GLUCAP in the last 168 hours. D-Dimer No results for input(s): DDIMER in the last 72 hours. Hgb A1c No results for input(s): HGBA1C in the last 72 hours. Lipid Profile No results for input(s): CHOL, HDL, LDLCALC, TRIG, CHOLHDL, LDLDIRECT in the last 72 hours. Thyroid function studies No results for input(s): TSH, T4TOTAL, T3FREE, THYROIDAB in the last 72 hours.  Invalid input(s): FREET3 Anemia work up No results for input(s): VITAMINB12, FOLATE, FERRITIN, TIBC, IRON, RETICCTPCT in the last 72 hours. Microbiology Recent Results (from the past 240 hour(s))  Respiratory Panel by RT PCR (Flu A&B, Covid) - Nasopharyngeal Swab     Status: None   Collection Time: 11/04/19  8:35 PM   Specimen: Nasopharyngeal Swab  Result Value Ref Range Status   SARS Coronavirus 2 by RT PCR NEGATIVE NEGATIVE Final    Comment: (NOTE) SARS-CoV-2 target nucleic acids are NOT DETECTED.  The SARS-CoV-2 RNA is generally detectable in upper respiratoy specimens during the acute phase of infection. The lowest concentration of SARS-CoV-2 viral copies this assay can detect is 131 copies/mL. A negative result does not preclude SARS-Cov-2 infection and should not be used as the sole  basis for treatment or other patient management decisions. A negative result may occur with  improper specimen collection/handling, submission of specimen other than nasopharyngeal swab, presence of viral mutation(s) within the areas targeted by this assay, and inadequate number of viral copies (<131 copies/mL). A negative result must be combined with clinical observations, patient history, and epidemiological information. The expected result is Negative.  Fact Sheet for Patients:  PinkCheek.be  Fact Sheet for Healthcare Providers:  GravelBags.it  This test is no t yet approved or cleared by the [place] FDA and  has been authorized for detection and/or diagnosis of SARS-CoV-2 by FDA under an Emergency Use Authorization (EUA). This EUA will remain  in effect (meaning this test can be used) for the duration of the COVID-19 declaration under Section 564(b)(1) of the Act, 21 U.S.C. section 360bbb-3(b)(1), unless the authorization is terminated or revoked sooner.     Influenza A by PCR NEGATIVE NEGATIVE Final   Influenza B by PCR NEGATIVE NEGATIVE Final    Comment: (NOTE) The Xpert Xpress SARS-CoV-2/FLU/RSV assay is intended as an aid in  the diagnosis of influenza from Nasopharyngeal swab specimens and  should not be used as a sole basis for treatment. Nasal washings and  aspirates are unacceptable for Xpert Xpress SARS-CoV-2/FLU/RSV  testing.  Fact Sheet for Patients: PinkCheek.be  Fact Sheet for Healthcare Providers: GravelBags.it  This test is not yet approved or cleared by the [place] FDA and  has been authorized for detection and/or diagnosis of SARS-CoV-2 by  FDA under an Emergency Use Authorization (EUA). This EUA will remain  in effect (meaning this test can be used) for the duration of the  Covid-19 declaration under Section 564(b)(1) of the  Act, 21  U.S.C. section 360bbb-3(b)(1), unless the authorization is  terminated or revoked. Performed at [facility], [place] [address] [place], [place] [address]   Culture, blood (routine x 2)     Status: None (Preliminary result)   Collection Time: 11/04/19  8:42 PM   Specimen: Porta Cath; Blood  Result Value Ref Range Status   Specimen Description   Final    PORTA CATH BLOOD Performed at [facility] [address] [place], [place] [address]    Special Requests   Final    BOTTLES DRAWN AEROBIC AND ANAEROBIC Blood Culture  adequate volume Performed at [facility] [address], [place], [place] [address]    Culture   Final    NO GROWTH 3 DAYS Performed at [facility], [place] [address] [place], [place] [address]    Report Status PENDING  Incomplete  Gastrointestinal Panel by PCR , Stool     Status: None   Collection Time: 11/05/19  1:00 PM   Specimen: Stool  Result Value Ref Range Status   Campylobacter species NOT DETECTED NOT DETECTED Final   Plesimonas shigelloides NOT DETECTED NOT DETECTED Final   Salmonella species NOT DETECTED NOT DETECTED Final   Yersinia enterocolitica NOT DETECTED NOT DETECTED Final   Vibrio species NOT DETECTED NOT DETECTED Final   Vibrio cholerae NOT DETECTED NOT DETECTED Final   Enteroaggregative E coli (EAEC) NOT DETECTED NOT DETECTED Final   Enteropathogenic E coli (EPEC) NOT DETECTED NOT DETECTED Final   Enterotoxigenic E coli (ETEC) NOT DETECTED NOT DETECTED Final   Shiga like toxin producing E coli (STEC) NOT DETECTED NOT DETECTED Final   Shigella/Enteroinvasive E coli (EIEC) NOT DETECTED NOT DETECTED Final   Cryptosporidium NOT DETECTED NOT DETECTED Final   Cyclospora cayetanensis NOT DETECTED NOT DETECTED Final   Entamoeba histolytica NOT DETECTED NOT DETECTED Final   Giardia lamblia NOT DETECTED NOT DETECTED Final   Adenovirus F40/41 NOT DETECTED NOT DETECTED Final   Astrovirus NOT  DETECTED NOT DETECTED Final   Norovirus GI/GII NOT DETECTED NOT DETECTED Final   Rotavirus A NOT DETECTED NOT DETECTED Final   Sapovirus (I, II, IV, and V) NOT DETECTED NOT DETECTED Final    Comment: Performed at [facility], [place] [address] [place], [place] [address]  C Difficile Quick Screen w PCR reflex     Status: None   Collection Time: 11/05/19  1:00 PM   Specimen: STOOL  Result Value Ref Range Status   C Diff antigen NEGATIVE NEGATIVE Final   C Diff toxin NEGATIVE NEGATIVE Final   C Diff interpretation No C. difficile detected.  Final    Comment: Performed at [facility], [place] [address], [place], [place] [address]      Studies:  No results found.  Assessment: 59 y.o.  1.  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  Pancolitis with nausea, vomiting, diarrhea, secondary to chemo  3.  Left iliac vein DVT, new  4.  Anemia secondary to recent chemotherapy and underlying lupus 5.  Hypokalemia 6.  Hypertension 7.  Migraine headaches 8.  Lupus   Plan: -overall improving  -continue supportive care, she can use Imodium or Lomotil for diarrhea -I discussed the option of Lovenox ejection once daily vs Eliquis, she will think about it and let us know tomorrow before discharge  -If she goes home tomorrow, I will see her back mid next week for close f/u  -OK to hold blood transfusion for now, H/H slowly improving    Truitt Merle, MD 11/08/2019  5:11 PM

## 2019-11-09 ENCOUNTER — Other Ambulatory Visit: Payer: Self-pay | Admitting: Hematology and Oncology

## 2019-11-09 DIAGNOSIS — R112 Nausea with vomiting, unspecified: Secondary | ICD-10-CM

## 2019-11-09 LAB — RENAL FUNCTION PANEL
Albumin: 2.9 g/dL — ABNORMAL LOW (ref 3.5–5.0)
Anion gap: 8 (ref 5–15)
BUN: <5 mg/dL — ABNORMAL LOW (ref 6–20)
CO2: 24 mmol/L (ref 22–32)
Calcium: 8.9 mg/dL (ref 8.9–10.3)
Chloride: 108 mmol/L (ref 98–111)
Creatinine, Ser: 0.63 mg/dL (ref 0.44–1.00)
GFR, Estimated: >60 mL/min (ref >60)
Glucose, Bld: 86 mg/dL (ref 70–99)
Phosphorus: 3 mg/dL (ref 2.5–4.6)
Potassium: 3.9 mmol/L (ref 3.5–5.1)
Sodium: 140 mmol/L (ref 135–145)

## 2019-11-09 LAB — CBC
HCT: 24.9 % — ABNORMAL LOW (ref 36.0–46.0)
Hemoglobin: 8.1 g/dL — ABNORMAL LOW (ref 12.0–15.0)
MCH: 29 pg (ref 26.0–34.0)
MCHC: 32.5 g/dL (ref 30.0–36.0)
MCV: 89.2 fL (ref 80.0–100.0)
Platelets: 267 10*3/uL (ref 150–400)
RBC: 2.79 MIL/uL — ABNORMAL LOW (ref 3.87–5.11)
RDW: 13.8 % (ref 11.5–15.5)
WBC: 7.7 10*3/uL (ref 4.0–10.5)
nRBC: 0 % (ref 0.0–0.2)

## 2019-11-09 MED ORDER — LOPERAMIDE HCL 2 MG PO CAPS: 4.0000 mg | ORAL_CAPSULE | Freq: Two times a day (BID) | ORAL | 0 refills | Status: DC | PRN

## 2019-11-09 MED ORDER — HEPARIN SOD (PORK) LOCK FLUSH 100 UNIT/ML IV SOLN
500.0000 [IU] | INTRAVENOUS | Status: DC
  Administered 2019-11-09: 500 [IU]

## 2019-11-09 MED ORDER — ENSURE ENLIVE PO LIQD
237.0000 mL | Freq: Two times a day (BID) | ORAL | 12 refills | Status: DC
Start: 2019-11-09 — End: 2020-02-01

## 2019-11-09 MED ORDER — ENOXAPARIN SODIUM 120 MG/0.8ML ~~LOC~~ SOLN
120.0000 mg | Freq: Every day | SUBCUTANEOUS | Status: DC
  Administered 2019-11-09: 120 mg via SUBCUTANEOUS
  Filled 2019-11-09: qty 0.8

## 2019-11-09 MED ORDER — TOPIRAMATE 50 MG PO TABS: 50.0000 mg | ORAL_TABLET | Freq: Every day | ORAL | Status: DC

## 2019-11-09 MED ORDER — HEPARIN SOD (PORK) LOCK FLUSH 100 UNIT/ML IV SOLN: 500.0000 [IU] | INTRAVENOUS | Status: DC | PRN

## 2019-11-09 MED ORDER — DIPHENOXYLATE-ATROPINE 2.5-0.025 MG PO TABS: 2.0000 | ORAL_TABLET | Freq: Four times a day (QID) | ORAL | 0 refills | Status: DC | PRN

## 2019-11-09 MED ORDER — ENOXAPARIN SODIUM 120 MG/0.8ML ~~LOC~~ SOLN: 120.0000 mg | Freq: Every day | SUBCUTANEOUS | 30 refills | Status: DC

## 2019-11-09 NOTE — Discharge Instructions (Signed)

## 2019-11-09 NOTE — TOC Benefit Eligibility Note (Signed)
Transition of Care [facility]) Benefit Eligibility Note    Patient Details  Name: Cynthia Frye MRN: [id number] Date of Birth: 1960-07-17   Medication/Dose: Lovenox not on formulary Enoxaparin preferred Rx 120 mg sub Q injection daily  Covered?:  (lovenox not on formulary enoxaparin preferred Rx)  Tier: 3 Drug  Prescription Coverage [facility]  Spoke with Person/Company/Phone Number:: Christy/ [facility] [employer] [phone]  Co-Pay: $0.00  Prior Approval: No  Deductible: Met       Kerin Salen Phone Number: 11/09/2019, 10:31 AM

## 2019-11-09 NOTE — TOC Benefit Eligibility Note (Signed)
Transition of Care [facility]) Benefit Eligibility Note    Patient Details  Name: Cynthia Frye MRN: [id number] Date of Birth: 1960/02/20   Medication/Dose: Eliquis and Xarelto 2x day for 30 days  Covered?: Yes  Tier: 3 Drug  Prescription Coverage Preferred Pharmacy: local  Spoke with Person/Company/Phone Number:: [employer] [employer] automated system [phone]  Co-Pay: $0.00  Prior Approval: No  Deductible: Met       Kerin Salen Phone Number: 11/09/2019, 10:17 AM

## 2019-11-09 NOTE — Progress Notes (Signed)
Pt discharged home in stable condition. Discharge instructions given. Script sent to pharmacy of choice. No immediate questions or concerns at this time. Pt discharged from unit via wheelchair.

## 2019-11-09 NOTE — Progress Notes (Signed)
Pt able to successfully demonstrate self administration of Lovenox.

## 2019-11-09 NOTE — Discharge Summary (Addendum)
Physician Discharge Summary  Cynthia Frye OYD:[id number] DOB: 01/20/1961  PCP: Josetta Huddle, MD  Admitted from: Home Discharged to: Home  Admit date: 11/04/2019 Discharge date: 11/09/2019  Recommendations for Outpatient Follow-up:    Follow-up Information    Josetta Huddle, MD. Schedule an appointment as soon as possible for a visit.   Specialty: Internal Medicine Contact information: [address] [place] [address] [phone]        Truitt Merle, MD Follow up.   Specialties: Hematology, Oncology Why: Dr. Ernestina Penna office will call you for an appointment to be seen next week Wednesday or Thursday with repeat labs Contact information: [place] [address] [phone]                Home Health: None Equipment/Devices: None  Discharge Condition: Improved and stable CODE STATUS: Full Diet recommendation: Regular diet.  Advised to consume frequent small meals and then gradually advance as tolerated.  Discharge Diagnoses:  Principal Problem:   Colitis Active Problems:   Essential hypertension   Lupus (systemic lupus erythematosus) (HCC)   Pancreatic cancer (HCC)   Acute deep vein thrombosis of left iliac vein (HCC)   Hypokalemia   Brief Summary: 59 year old female with PMH of pancreatic cancer, recently started on chemotherapy 2 weeks PTA, also with history of lupus, hypertension, chronic anemia, presented to the ED on 11/04/2019 with complaints of persistent nausea, vomiting, diarrhea, abdominal pain and poor oral intake that started after chemotherapy 2 weeks ago and have been progressively worsening.  She had presented to ED with similar complaints on 10/8 and was discharged home on antibiotics but she was unable to hold them down because of vomiting.  Of note, 10/5, patient had biopsy of liver lesions for suspicious metastasis.  Admitted for pancolitis and left iliac vein DVT.     Assessment & Plan:    Pancolitis:  CT abdomen and pelvis showed circumferential bowel wall thickening with adjacent fat stranding throughout the colon, most predominant in the transverse colon.  Findings consistent with colitis.  Suspecting chemo therapy induced colitis and diarrhea.  She developed significant nausea, vomiting and diarrhea after first cycle chemotherapy with FOLFIRINOX.   C. difficile and GI panel PCR negative.  Treated supportively with IVF, until oral intake consistently up, replaced electrolytes as needed, antiemetics and Lomotil.    Empiric antibiotics were discontinued since infectious etiology was felt less likely.  Clinically improved.  Had an episode of vomiting last night most likely related to liquid potassium supplement.  But otherwise no nausea or vomiting reported.  Tolerating diet.  Diarrhea has significantly improved.  Slept through the night without diarrhea.  Had to loose BMs this morning with some stools in it.  Counseled frequent small meals and gradually advance as tolerated.  As advised by Dr. Burr Medico, continue as needed Imodium +/- Lomotil.  Improved.  SIRS/sinus tachycardia  Secondary to above.  Resolved.    Left internal iliac vein DVT:   Noted on CT abdomen and pelvis.  Was treated in the hospital with full dose twice daily Lovenox.  I discussed in detail with patient on 11/08/2019 regarding options of continued full dose Lovenox versus oral anticoagulation including warfarin, Pradaxa, Eliquis and Xarelto.  Went over in detail regarding risks and benefits of anticoagulation, adverse effects of these medications, management etc.  She was open to the idea of continuing full dose Lovenox and she has given methotrexate parenterally in the past.  However it might be more convenient  to take oral medication rather than injectables daily.  She was open to the idea of oral anticoagulation.  She wished to think about this.    Patient indicated today that after her discussion  with her oncologist yesterday, she has finally made the decision to discharge on once daily dose of therapeutic Lovenox.  TOC confirmed that this is covered by her insurance at 0 co-pay to her.  Hypokalemia/hypomagnesemia/hypophosphatemia  Secondary to ongoing GI losses/diarrhea.  She had persistent hypokalemia for several days due to poor oral intake and ongoing diarrhea.  Finally replaced today.  Continue prior home dose of potassium supplements with close follow-up with repeat BMPs mid next week.  Essential hypertension:  Continue Toprol, improved BP control.    Discontinued HCTZ and losartan for now given poor oral intake, GI losses and concern for dehydration and acute kidney injury.  Moreover blood pressure is reasonably controlled on Toprol alone.  Metastatic pancreatic cancer  Follows with Dr. Burr Medico, Oncology.  Last chemo 2 weeks ago.  10/5, CT liver biopsy confirmed metastatic pancreatic cancer.  Dr. Burr Medico plans to cancel her second cycle chemotherapy this week and postpone it to 1 to 2 weeks until she recovers well.  CT abdomen on admission showed innumerable hypodense masses throughout the liver increased in conspicuity worrisome for metastatic disease.  I discussed with Dr. Burr Medico who has cleared her for discharge home and will arrange follow-up on Wednesday or Thursday of next week with repeat labs.  She agrees with above plan.  History of lupus  Presently on Plaquenil and diclofenac.  She was on methotrexate previously which was held before chemotherapy and will continue to remain on hold until cleared by her oncologist.  Anemia of chronic disease  Hemoglobin has gradually improved to 8.1 g per DL.  Follow CBC and transfuse if hemoglobin 7 g or less.  Body mass index is 28.74 kg/m.  Nutritional Status Nutrition Problem: Increased nutrient needs Etiology: cancer and cancer related treatments Signs/Symptoms: estimated needs Interventions: Ensure Enlive (each  supplement provides 350kcal and 20 grams of protein)    Consultants:   Oncology  Procedures:   None   Discharge Instructions  Discharge Instructions    Call MD for:   Complete by: As directed    Persistent or worsening diarrhea.   Call MD for:  difficulty breathing, headache or visual disturbances   Complete by: As directed    Call MD for:  extreme fatigue   Complete by: As directed    Call MD for:  persistant dizziness or light-headedness   Complete by: As directed    Call MD for:  persistant nausea and vomiting   Complete by: As directed    Call MD for:  severe uncontrolled pain   Complete by: As directed    Call MD for:  temperature >100.4   Complete by: As directed    Diet general   Complete by: As directed    Recommend frequent small meals and then gradually advance as tolerated.   Increase activity slowly   Complete by: As directed        Medication List    STOP taking these medications   cephALEXin 500 MG capsule Commonly known as: KEFLEX   cetirizine 10 MG chewable tablet Commonly known as: ZYRTEC   losartan-hydrochlorothiazide 50-12.5 MG tablet Commonly known as: HYZAAR     TAKE these medications   acetaminophen 500 MG tablet Commonly known as: TYLENOL Take 1,000 mg by mouth every 6 (six) hours as needed  for moderate pain.   albuterol 108 (90 Base) MCG/ACT inhaler Commonly known as: VENTOLIN HFA Inhale 2 puffs into the lungs every 6 (six) hours as needed for wheezing or shortness of breath.   ALPRAZolam 0.25 MG tablet Commonly known as: XANAX Take 1 tablet (0.25 mg total) by mouth at bedtime as needed for anxiety.   aspirin 81 MG tablet Take 81 mg by mouth daily.   Bepreve 1.5 % Soln Generic drug: Bepotastine Besilate Place 1 drop into both eyes 2 (two) times daily as needed (DRY EYES).   dexamethasone 4 MG tablet Commonly known as: DECADRON Take 1 tablet (4 mg total) by mouth daily. Take 1 tablet once daily for 3 to 5 days after  chemo   diclofenac 75 MG EC tablet Commonly known as: VOLTAREN Take 75 mg by mouth 2 (two) times daily.   diphenoxylate-atropine 2.5-0.025 MG tablet Commonly known as: LOMOTIL Take 2 tablets by mouth 4 (four) times daily as needed for diarrhea or loose stools (use 2nd, if Imodium doesn't work. Max dose: 8 tablets/day.).   Diprolene 0.05 % ointment Generic drug: augmented betamethasone dipropionate Apply 1 application topically as needed (skin irritation).   enoxaparin 120 MG/0.8ML injection Commonly known as: LOVENOX Inject 0.8 mLs (120 mg total) into the skin daily. Start taking on: November 10, 2019   esomeprazole 10 MG packet Commonly known as: NEXIUM Take 10 mg by mouth daily as needed (heartburn).   feeding supplement Liqd Take 237 mLs by mouth 2 (two) times daily between meals.   ferrous sulfate 325 (65 FE) MG tablet Take 325 mg by mouth daily with breakfast.   fluticasone 50 MCG/ACT nasal spray Commonly known as: FLONASE Place 2 sprays into both nostrils daily.   hydroxychloroquine 200 MG tablet Commonly known as: PLAQUENIL Take 400 mg by mouth daily.   lidocaine-prilocaine cream Commonly known as: EMLA Apply to affected area once What changed:   how much to take  how to take this  when to take this  additional instructions   loperamide 2 MG capsule Commonly known as: IMODIUM Take 2 capsules (4 mg total) by mouth 2 (two) times daily as needed for diarrhea or loose stools (Use 1st).   loratadine 10 MG tablet Commonly known as: CLARITIN Take 10 mg by mouth daily.   methotrexate 50 MG/2ML injection Inject 25 mg into the skin once a week. 73mL = 25mg    metoprolol succinate 50 MG 24 hr tablet Commonly known as: TOPROL-XL Take 50 mg by mouth daily.   mirtazapine 15 MG tablet Commonly known as: Remeron Take 1 tablet (15 mg total) by mouth at bedtime.   ondansetron 8 MG tablet Commonly known as: Zofran Take 1 tablet (8 mg total) by mouth 2 (two)  times daily as needed. Start on day 3 after chemotherapy.   oxyCODONE 5 MG immediate release tablet Commonly known as: Oxy IR/ROXICODONE Take 1 tablet (5 mg total) by mouth every 6 (six) hours as needed for severe pain.   potassium chloride SA 20 MEQ tablet Commonly known as: KLOR-CON Take 1 tablet (20 mEq total) by mouth 2 (two) times daily.   prochlorperazine 10 MG tablet Commonly known as: COMPAZINE Take 1 tablet (10 mg total) by mouth every 6 (six) hours as needed (Nausea or vomiting). What changed: reasons to take this   Rheumate Caps Take 1 capsule by mouth daily.   rizatriptan 10 MG disintegrating tablet Commonly known as: MAXALT-MLT Take 1 tablet (10 mg total) by mouth as needed  for migraine. May repeat in 2 hours if needed What changed: when to take this   topiramate 50 MG tablet Commonly known as: Topamax Take 1 tablet (50 mg total) by mouth at bedtime.   Vitamin D 50 MCG (2000 UT) Caps Take 2,000 Units by mouth daily.      Allergies  Allergen Reactions  . Cinnamon Other (See Comments)    Other reaction(s): Other (See Comments) Unknown  On allergy test Unknown  On allergy test  . Peanut-Containing Drug Products Hives  . Prednisone Other (See Comments)    Interacts with another medicine she is taking  . Corn Oil Hives  . Corn-Containing Products Hives  . Other Rash    Red grapefruit and naval oranges- lips tingling and facial rash Potatoes, tomatoes, garlic, oregano/basil caused headache      Procedures/Studies:  CT ABDOMEN PELVIS W CONTRAST  Result Date: 11/04/2019 CLINICAL DATA:  Nausea and vomiting, history of pancreatic cancer EXAM: CT ABDOMEN AND PELVIS WITH CONTRAST TECHNIQUE: Multidetector CT imaging of the abdomen and pelvis was performed using the standard protocol following bolus administration of intravenous contrast. CONTRAST:  126mL OMNIPAQUE IOHEXOL 300 MG/ML  SOLN COMPARISON:  September 19, 2019. FINDINGS: Lower chest: No acute  abnormality.  LEFT basilar atelectasis. Hepatobiliary: There are innumerable hypodense masses throughout the liver, increased in conspicuity in comparison to prior study. Representative mass of the hepatic dome measures 16 mm, previously 7 mm (series 2, image 8). Newly conspicuous mass of the RIGHT inferior liver measures 12 mm (series 2, image 28). Gallbladder is unremarkable. There is mild central biliary ductal prominence. Pancreas: Ill-defined pancreatic mass of the pancreatic head measures approximately 3.2 x 1.9 by 3.3 cm, similar to mildly increased in comparison to prior. There is upstream pancreatic ductal dilation, unchanged in comparison to prior. This mass directly abuts the duodenum. There is adjacent fat stranding along the head of the pancreas, similar in comparison to prior. Spleen: Unremarkable. Adrenals/Urinary Tract: Adrenals are unremarkable. Kidneys enhance symmetrically. No hydronephrosis. Bladder is unremarkable. Stomach/Bowel: No evidence of bowel obstruction. There is circumferential bowel wall thickening with adjacent fat stranding throughout the colon. This is most predominant in the transverse colon. Appendix is unremarkable. Vascular/Lymphatic: Peripancreatic lymph node measures 9 mm, unchanged (series 2, image 28). There is a filling defect in the LEFT internal iliac vein. Minimal atherosclerotic calcifications. Aortic branches are patent. Reproductive: Status post hysterectomy. Other: No free air. Musculoskeletal: No acute or significant osseous findings. IMPRESSION: 1. Circumferential bowel wall thickening with adjacent fat stranding throughout the colon, most predominant in the transverse colon. This is consistent with colitis, which may be infectious or inflammatory in etiology. 2. Ill-defined pancreatic head mass consistent with known malignancy, similar to mildly increased in comparison to prior CT. There are innumerable hypodense masses throughout the liver, increased in  conspicuity in comparison to prior CT. Findings are worrisome for worsening metastatic disease. 3. Filling defect in the LEFT internal iliac vein, likely a thrombus with differential considerations including mixing artifact. Electronically Signed   By: [name] MD   On: 11/04/2019 19:10   DG Chest Port 1 View  Result Date: 11/04/2019 CLINICAL DATA:  Nausea, vomiting and diarrhea since chemotherapy 2 weeks prior, history of pancreatic cancer EXAM: PORTABLE CHEST 1 VIEW COMPARISON:  Radiograph 10/28/2019 FINDINGS: Accessed left subclavian approach Port-A-Cath tip terminates at the superior cavoatrial junction in similar position to prior. Telemetry leads overlie the chest. Stable streaky opacities in the left lung base likely reflecting scarring or subsegmental  atelectasis, less likely infection. No new consolidative opacity. No pneumothorax or effusion. The cardiomediastinal contours are unremarkable. No acute osseous or soft tissue abnormality. IMPRESSION: Stable streaky opacities in the left lung base likely reflecting scarring or subsegmental atelectasis, less likely infection. No new acute cardiopulmonary abnormality. Electronically Signed   By: Lovena Le M.D.   On: 11/04/2019 20:59   VAS [place] LOWER EXTREMITY VENOUS (DVT) ([facility] and [facility] 7a-7p)  Result Date: 11/05/2019  Lower Venous DVT Study Indications: CT Abdomen filling defect.  Risk Factors: Cancer. Comparison Study: No prior studies. Performing Technologist: Oliver Hum RVT  Examination Guidelines: A complete evaluation includes B-mode imaging, spectral Doppler, color Doppler, and power Doppler as needed of all accessible portions of each vessel. Bilateral testing is considered an integral part of a complete examination. Limited examinations for reoccurring indications may be performed as noted. The reflux portion of the exam is performed with the patient in reverse Trendelenburg.   +-----+---------------+---------+-----------+----------+--------------+ RIGHTCompressibilityPhasicitySpontaneityPropertiesThrombus Aging +-----+---------------+---------+-----------+----------+--------------+ CFV  Full           Yes      Yes                                 +-----+---------------+---------+-----------+----------+--------------+   +---------+---------------+---------+-----------+----------+--------------+ LEFT     CompressibilityPhasicitySpontaneityPropertiesThrombus Aging +---------+---------------+---------+-----------+----------+--------------+ CFV      Full           Yes      Yes                                 +---------+---------------+---------+-----------+----------+--------------+ SFJ      Full                                                        +---------+---------------+---------+-----------+----------+--------------+ FV Prox  Full                                                        +---------+---------------+---------+-----------+----------+--------------+ FV Mid   Full                                                        +---------+---------------+---------+-----------+----------+--------------+ FV DistalFull                                                        +---------+---------------+---------+-----------+----------+--------------+ PFV      Full                                                        +---------+---------------+---------+-----------+----------+--------------+ POP      Full  Yes      Yes                                 +---------+---------------+---------+-----------+----------+--------------+ PTV      Full                                                        +---------+---------------+---------+-----------+----------+--------------+ PERO     Full                                                         +---------+---------------+---------+-----------+----------+--------------+ EIV                     Yes      Yes                                 +---------+---------------+---------+-----------+----------+--------------+ CIV                                                   Not visualized +---------+---------------+---------+-----------+----------+--------------+     Summary: RIGHT: - No evidence of common femoral vein obstruction.  LEFT: - There is no evidence of deep vein thrombosis in the lower extremity.  - No cystic structure found in the popliteal fossa.  *See table(s) above for measurements and observations. Electronically signed by Monica Martinez MD on 11/05/2019 at 5:21:58 PM.    Final       Subjective: Patient interviewed and examined along with her female RN in the room.  Overall feels better.  Had an episode of vomiting last night after dinner and liquid potassium supplement.  No further nausea or vomiting since then.  Tolerating diet.  Slept through the night without diarrhea.  Had 2 episodes of loose stools this morning with some substance in it.  No abdominal pain or chest pain reported.  No dizziness or lightheadedness reported.  Discharge Exam:  Vitals:   11/08/19 0603 11/08/19 1335 11/08/19 2107 11/09/19 0547  BP: 127/80 (!) 145/88 130/84 129/85  Pulse: 76 82 91 86  Resp:  18 14 16   Temp: 98 F (36.7 C) 98.3 F (36.8 C) 98.5 F (36.9 C) 97.9 F (36.6 C)  TempSrc: Oral Oral Oral Oral  SpO2: 98% 100% 99% 100%  Weight:      Height:         General exam: Pleasant young female, moderately built and nourished sitting up comfortably in bed without distress.  Oral mucosa moist. Respiratory system: Clear to auscultation. Respiratory effort normal. Cardiovascular system: S1 & S2 heard, RRR. No JVD, murmurs, rubs, gallops or clicks. No pedal edema.  Telemetry personally reviewed: Sinus rhythm.  Gastrointestinal system: Abdomen is nondistended, soft and  nontender. No organomegaly or masses felt. Normal bowel sounds heard. Central nervous system: Alert and oriented. No focal neurological deficits. Extremities: Symmetric 5 x 5 power. Skin: No rashes, lesions or ulcers Psychiatry: Judgement  and insight appear normal. Mood & affect appropriate.    The results of significant diagnostics from this hospitalization (including imaging, microbiology, ancillary and laboratory) are listed below for reference.     Microbiology: Recent Results (from the past 240 hour(s))  Respiratory Panel by RT PCR (Flu A&B, Covid) - Nasopharyngeal Swab     Status: None   Collection Time: 11/04/19  8:35 PM   Specimen: Nasopharyngeal Swab  Result Value Ref Range Status   SARS Coronavirus 2 by RT PCR NEGATIVE NEGATIVE Final    Comment: (NOTE) SARS-CoV-2 target nucleic acids are NOT DETECTED.  The SARS-CoV-2 RNA is generally detectable in upper respiratoy specimens during the acute phase of infection. The lowest concentration of SARS-CoV-2 viral copies this assay can detect is 131 copies/mL. A negative result does not preclude SARS-Cov-2 infection and should not be used as the sole basis for treatment or other patient management decisions. A negative result may occur with  improper specimen collection/handling, submission of specimen other than nasopharyngeal swab, presence of viral mutation(s) within the areas targeted by this assay, and inadequate number of viral copies (<131 copies/mL). A negative result must be combined with clinical observations, patient history, and epidemiological information. The expected result is Negative.  Fact Sheet for Patients:  PinkCheek.be  Fact Sheet for Healthcare Providers:  GravelBags.it  This test is no t yet approved or cleared by the [place] FDA and  has been authorized for detection and/or diagnosis of SARS-CoV-2 by FDA under an Emergency Use  Authorization (EUA). This EUA will remain  in effect (meaning this test can be used) for the duration of the COVID-19 declaration under Section 564(b)(1) of the Act, 21 U.S.C. section 360bbb-3(b)(1), unless the authorization is terminated or revoked sooner.     Influenza A by PCR NEGATIVE NEGATIVE Final   Influenza B by PCR NEGATIVE NEGATIVE Final    Comment: (NOTE) The Xpert Xpress SARS-CoV-2/FLU/RSV assay is intended as an aid in  the diagnosis of influenza from Nasopharyngeal swab specimens and  should not be used as a sole basis for treatment. Nasal washings and  aspirates are unacceptable for Xpert Xpress SARS-CoV-2/FLU/RSV  testing.  Fact Sheet for Patients: PinkCheek.be  Fact Sheet for Healthcare Providers: GravelBags.it  This test is not yet approved or cleared by the [place] FDA and  has been authorized for detection and/or diagnosis of SARS-CoV-2 by  FDA under an Emergency Use Authorization (EUA). This EUA will remain  in effect (meaning this test can be used) for the duration of the  Covid-19 declaration under Section 564(b)(1) of the Act, 21  U.S.C. section 360bbb-3(b)(1), unless the authorization is  terminated or revoked. Performed at [facility], [place] [address] [place], [place] [address]   Culture, blood (routine x 2)     Status: None (Preliminary result)   Collection Time: 11/04/19  8:42 PM   Specimen: Porta Cath; Blood  Result Value Ref Range Status   Specimen Description   Final    PORTA CATH BLOOD Performed at [facility] [address] [place], [place] [address]    Special Requests   Final    BOTTLES DRAWN AEROBIC AND ANAEROBIC Blood Culture adequate volume Performed at [facility] [address], [place], [place] [address]    Culture   Final    NO GROWTH 4 DAYS Performed at [facility], [place] [address] [place],  [address]     Report Status PENDING  Incomplete  Gastrointestinal Panel by PCR , Stool     Status: None   Collection Time: 11/05/19  1:00 PM   Specimen: Stool  Result Value Ref Range Status   Campylobacter species NOT DETECTED NOT DETECTED Final   Plesimonas shigelloides NOT DETECTED NOT DETECTED Final   Salmonella species NOT DETECTED NOT DETECTED Final   Yersinia enterocolitica NOT DETECTED NOT DETECTED Final   Vibrio species NOT DETECTED NOT DETECTED Final   Vibrio cholerae NOT DETECTED NOT DETECTED Final   Enteroaggregative E coli (EAEC) NOT DETECTED NOT DETECTED Final   Enteropathogenic E coli (EPEC) NOT DETECTED NOT DETECTED Final   Enterotoxigenic E coli (ETEC) NOT DETECTED NOT DETECTED Final   Shiga like toxin producing E coli (STEC) NOT DETECTED NOT DETECTED Final   Shigella/Enteroinvasive E coli (EIEC) NOT DETECTED NOT DETECTED Final   Cryptosporidium NOT DETECTED NOT DETECTED Final   Cyclospora cayetanensis NOT DETECTED NOT DETECTED Final   Entamoeba histolytica NOT DETECTED NOT DETECTED Final   Giardia lamblia NOT DETECTED NOT DETECTED Final   Adenovirus F40/41 NOT DETECTED NOT DETECTED Final   Astrovirus NOT DETECTED NOT DETECTED Final   Norovirus GI/GII NOT DETECTED NOT DETECTED Final   Rotavirus A NOT DETECTED NOT DETECTED Final   Sapovirus (I, II, IV, and V) NOT DETECTED NOT DETECTED Final    Comment: Performed at [facility], [address] [place], [place] [id number]  C Difficile Quick Screen w PCR reflex     Status: None   Collection Time: 11/05/19  1:00 PM   Specimen: STOOL  Result Value Ref Range Status   C Diff antigen NEGATIVE NEGATIVE Final   C Diff toxin NEGATIVE NEGATIVE Final   C Diff interpretation No C. difficile detected.  Final    Comment: Performed at [facility], [place] [address] [place], [place] [address]     Labs: CBC: Recent Labs  Lab 11/04/19 1549 11/04/19 1549 11/05/19 0409 11/06/19 0500 11/07/19 0544 11/08/19 0619  11/09/19 0543  WBC 8.9   < > 7.5 6.7 6.7 6.7 7.7  NEUTROABS 6.3  --  4.4 4.1 3.8 3.4  --   HGB 10.0*   < > 8.0* 7.0* 7.3* 7.6* 8.1*  HCT 29.3*   < > 24.5* 21.3* 22.4* 23.3* 24.9*  MCV 86.9   < > 89.1 91.0 90.7 89.6 89.2  PLT 194   < > 168 181 213 244 267   < > = values in this interval not displayed.    Basic Metabolic Panel: Recent Labs  Lab 11/04/19 1549 11/04/19 1549 11/05/19 0409 11/06/19 0500 11/07/19 0544 11/08/19 0619 11/08/19 1718 11/09/19 0543  NA 136   < > 134* 142 141 137  --  140  K 2.9*   < > 3.2* 3.4* 3.3* 3.3*  --  3.9  CL 95*   < > 99 108 108 106  --  108  CO2 28   < > 26 24 24 23   --  24  GLUCOSE 113*   < > 101* 84 81 81  --  86  BUN 9   < > 5* <5* <5* <5*  --  <5*  CREATININE 0.80   < > 0.65 0.73 0.66 0.60  --  0.63  CALCIUM 9.5   < > 8.5* 8.6* 8.7* 8.4*  --  8.9  MG 1.6*  --  2.0 2.0  --   --  1.7  --   PHOS  --   --  2.1* 2.6  --   --  2.6 3.0   < > = values in this interval not displayed.    Liver Function Tests: Recent Labs  Lab 11/04/19 1549 11/05/19 0409 11/09/19 0543  AST 31 24  --   ALT 33 26  --   ALKPHOS 111 81  --   BILITOT 0.5 0.4  --   PROT 7.5 6.0*  --   ALBUMIN 3.5 2.8* 2.9*     Urinalysis    Component Value Date/Time   COLORURINE YELLOW 10/28/2019 2143   APPEARANCEUR CLEAR 10/28/2019 2143   LABSPEC 1.009 10/28/2019 2143   PHURINE 6.0 10/28/2019 2143   GLUCOSEU NEGATIVE 10/28/2019 2143   HGBUR NEGATIVE 10/28/2019 2143   BILIRUBINUR NEGATIVE 10/28/2019 2143   KETONESUR 5 (A) 10/28/2019 2143   PROTEINUR NEGATIVE 10/28/2019 2143   NITRITE NEGATIVE 10/28/2019 2143   LEUKOCYTESUR LARGE (A) 10/28/2019 2143    I discussed in detail with patients spouse via phone, updated care and answered all questions.  Time coordinating discharge: 40 minutes  SIGNED:  Vernell Leep, MD, [place], [facility]. [facility]  To contact the attending provider between 7A-7P or the covering provider during after hours 7P-7A, please log into  the web site [email or web address] and access using universal [place] password for that web site. If you do not have the password, please call the hospital operator.

## 2019-11-09 NOTE — Progress Notes (Signed)
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 Cinnamon Other (See Comments)    Other reaction(s): Other (See Comments) Unknown  On allergy test Unknown  On allergy test  . Peanut-Containing Drug Products Hives  . Prednisone Other (See Comments)    Interacts with another medicine she is taking  . Corn Oil Hives  . Corn-Containing Products Hives  . Other Rash    Red grapefruit and naval oranges- lips tingling and facial rash Potatoes, tomatoes, garlic, oregano/basil caused headache    Patient Measurements: Height: 5\' 7"  (170.2 cm) Weight: 83.2 kg (183 lb 8 oz) IBW/kg (Calculated) : 61.6 Last documented height 67", weight 84.8 kg  Vital Signs: Temp: 97.9 F (36.6 C) (10/15 0547) Temp Source: Oral (10/15 0547) BP: 129/85 (10/15 0547) Pulse Rate: 86 (10/15 0547)  Labs: Recent Labs    11/07/19 0544 11/07/19 0544 11/08/19 0619 11/09/19 0543  HGB 7.3*   < > 7.6* 8.1*  HCT 22.4*  --  23.3* 24.9*  PLT 213  --  244 267  CREATININE 0.66  --  0.60 0.63   < > = values in this interval not displayed.    Estimated Creatinine Clearance: 84.9 mL/min (by C-G formula based on SCr of 0.63 mg/dL).   Medical History: Past Medical History:  Diagnosis Date  . Allergies    peanuts, corn, beans, red grapefruit, naval oranges  . Asthma    allergy shots and medication  . Cancer [facility])    pancreatic  . Collagen vascular disease ([place])   . DDD (degenerative disc disease), lumbar   . Eustachian tube dysfunction 12/2012   rhinitis, vertigo- Dr. Wilburn Cornelia, ENT   . Fibroids   . Heart murmur    Echo 1/18: EF 55-60, no RWMA, normal diastolic function, trivial AI, PASP 32  . History of cardiac catheterization    LHC 3/18: normal coronary arteries.   . History of nuclear stress test    ETT-Myoview 2/18: EF 62, + ECG response; apical and distal septal ischemia; intermediate risk.  Marland Kitchen Hypertension    . Iron deficiency anemia   . Lupus [facility]) 2011   Dr. Trudie Reed  . Migraine headache    trial of generic maxalt 10 mg, January 2021  . Obesity   . Seasonal allergies   . Seizure in childhood [facility])    as a child no treatment none x 30 years    Medications:  Scheduled:  . aspirin EC  81 mg Oral Daily  . Chlorhexidine Gluconate Cloth  6 each Topical Daily  . diclofenac  75 mg Oral BID  . enoxaparin (LOVENOX) injection  120 mg Subcutaneous Daily  . feeding supplement  237 mL Oral BID BM  . hydroxychloroquine  400 mg Oral Daily  . metoprolol tartrate  25 mg Oral BID  . topiramate  50 mg Oral QHS   Infusions:  . sodium chloride 75 mL/hr at 11/09/19 0300    Assessment: 71 yoF admitted on 11/04/19 with N/V/D for 2 weeks after her first chemotherapy treatments on 9/29 for pancreatic cancer.  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 CT:  Filling defect in the LEFT internal iliac vein, likely a thrombus with differential considerations including mixing artifact. No prior to admission anticoagulation SCr <1 with CrCl ~ 85 ml/min CBC:  Hgb low but stable d/t recent chemo and lupus No bleeding or complications reported.  Goal of Therapy:  Anti-Xa level 0.6-1  units/ml 4hrs after LMWH dose given Monitor platelets by anticoagulation protocol: Yes   Plan:  Patient desires to use Lovenox as anti-coagulation Will change dose to 1.5mg /kg q24, Lovenox 120mg  SQ daily Educated patient about anti-coagulation with Lovenox.  Minda Ditto PharmD 11/09/2019, 12:13 PM

## 2019-11-10 LAB — CULTURE, BLOOD (ROUTINE X 2)
Culture: NO GROWTH
Special Requests: ADEQUATE

## 2019-11-12 ENCOUNTER — Encounter: Payer: Self-pay | Admitting: Genetic Counselor

## 2019-11-12 ENCOUNTER — Telehealth: Payer: Self-pay | Admitting: Genetic Counselor

## 2019-11-12 ENCOUNTER — Ambulatory Visit: Payer: Self-pay | Admitting: Genetic Counselor

## 2019-11-12 DIAGNOSIS — Z1379 Encounter for other screening for genetic and chromosomal anomalies: Secondary | ICD-10-CM

## 2019-11-12 DIAGNOSIS — C250 Malignant neoplasm of head of pancreas: Secondary | ICD-10-CM

## 2019-11-12 NOTE — Telephone Encounter (Signed)
Revealed negative genetic testing.  Discussed that we do not know why she has pancreatic cancer. It could be sporadic, due to a different gene that we are not testing, or maybe our current technology may not be able to pick something up.  It will be important for her to keep in contact with genetics to keep up with whether additional testing may be needed.

## 2019-11-12 NOTE — Progress Notes (Signed)
HPI:  Cynthia Frye was previously seen in the [place] clinic due to a personal history of pancreatic cancer and concerns regarding a hereditary predisposition to cancer. Please refer to our prior cancer genetics clinic note for more information regarding our discussion, assessment and recommendations, at the time. Cynthia Frye recent genetic test results were disclosed to her, as were recommendations warranted by these results. These results and recommendations are discussed in more detail below.  CANCER HISTORY:  Oncology History Overview Note  Cancer Staging Pancreatic cancer [facility]) Staging form: Exocrine Pancreas, AJCC 8th Edition - Clinical stage from 10/18/2019: Stage IV (cT3, cN1, cM1) - Signed by Truitt Merle, MD on 10/18/2019    Pancreatic cancer ([place])  09/19/2019 Imaging   CT AP w contrast 09/19/19  IMPRESSION: 1. Findings are highly concerning for probable primary pancreatic adenocarcinoma in the anterior aspect of the pancreatic head. Several prominent borderline enlarged lymph nodes are noted in the hepatoduodenal nodal station, and there are multiple indeterminate liver lesions which are highly concerning for probable hepatic metastases. Further evaluation with nonemergent abdominal MRI with and without IV gadolinium with MRCP is recommended in the near future to better evaluate these findings.   09/25/2019 Procedure   Upper EUS by Dr Paulita Fujita  IMPRESSION -There was no evidence of significant pathology in the left lobe of the liver.  -A few lymph nodes were visualized and measures in the peripancreatic region and porta hepata region.  -Hyperchoic material consistent with sludge was visualized endosonographically in the gallbladder.  -There was no sign of significant pathology in the common bile duct.  -A mass was identified in the pancreatic head. If biopsy results show adenocarcinoma, it would be staged T3N1Mx by endosonographic criteria. Fine needle aspiration  performed.    09/25/2019 Initial Biopsy   FINAL MICROSCOPIC DIAGNOSIS: Fine needle aspirate, Pancreas;  MALIGNANT CELLS PRESENT CONSISTENT WITH ADENOCARCINOMA.    10/02/2019 Initial Diagnosis   Pancreatic cancer ([place])   10/11/2019 Procedure   PAC placement y Dr Barry Dienes    10/15/2019 Imaging   CT Chest  IMPRESSION: 1. Small anterior left pneumothorax with dependent atelectasis in the left lower lobe. 2. Increased number of bilateral axillary and subpectoral lymph nodes with mild lymphadenopathy in the left axilla. While this would be an atypical presentation for metastatic pancreatic cancer, this possibility is not excluded 3. Main duct dilatation in the pancreas, better assessed on abdomen CT 09/19/2019.   10/16/2019 Imaging   MRI Abdomen  IMPRESSION: 1. Substantially motion degraded scan. 2. Probable persistent small anterior left lung base pneumothorax, better seen on chest CT from 1 day prior. 3. Poorly marginated hypoenhancing 3.7 x 2.9 cm pancreatic head mass, which appears to invade the anterior peripancreatic fat, compatible with known pancreatic adenocarcinoma. Diffuse irregular dilatation of the main pancreatic duct in the pancreatic body and tail. Mild narrowing of the main portal vein by the mass. Abdominal vasculature remains patent and otherwise uninvolved. 4. Numerous (greater than 10) small liver masses scattered throughout the liver, largest 1.0 cm, which appear to demonstrate targetoid enhancement on the limited motion degraded postcontrast sequences, compatible with liver metastases. 5. Mild porta hepatis adenopathy, suspicious for metastatic disease.   10/18/2019 Cancer Staging   Staging form: Exocrine Pancreas, AJCC 8th Edition - Clinical stage from 10/18/2019: Stage IV (cT3, cN1, cM1) - Signed by Truitt Merle, MD on 10/18/2019   10/24/2019 -  Chemotherapy   First-line FOLFIRINOX q2weeks starting 10/24/19   11/02/2019 Genetic Testing   Negative genetic testing:  no pathogenic variants detected in [facility] Common Hereditary Cancers Panel.  The report date is November 02, 2019.   The Common Hereditary Cancers Panel offered by [facility] includes sequencing and/or deletion duplication testing of the following 48 genes: APC, ATM, AXIN2, BARD1, BMPR1A, BRCA1, BRCA2, BRIP1, CDH1, CDK4, CDKN2A (p14ARF), CDKN2A (p16INK4a), CHEK2, CTNNA1, DICER1, EPCAM (Deletion/duplication testing only), GREM1 (promoter region deletion/duplication testing only), KIT, MEN1, MLH1, MSH2, MSH3, MSH6, MUTYH, NBN, NF1, NHTL1, PALB2, PDGFRA, PMS2, POLD1, POLE, PTEN, RAD50, RAD51C, RAD51D, RNF43, SDHB, SDHC, SDHD, SMAD4, SMARCA4. STK11, TP53, TSC1, TSC2, and VHL.  The following genes were evaluated for sequence changes only: SDHA and HOXB13 c.251G>A variant only.   11/04/2019 Imaging   CT AP  IMPRESSION: 1. Circumferential bowel wall thickening with adjacent fat stranding throughout the colon, most predominant in the transverse colon. This is consistent with colitis, which may be infectious or inflammatory in etiology. 2. Ill-defined pancreatic head mass consistent with known malignancy, similar to mildly increased in comparison to prior CT. There are innumerable hypodense masses throughout the liver, increased in conspicuity in comparison to prior CT. Findings are worrisome for worsening metastatic disease. 3. Filling defect in the LEFT internal iliac vein, likely a thrombus with differential considerations including mixing artifact.     FAMILY HISTORY:  We obtained a detailed, 4-generation family history.  Significant diagnoses are listed below: Family History  Problem Relation Age of Onset  . Cancer Niece        paternal half sister's daughter; unknown type; unknown age diagnosed     Cynthia Frye has one brother, age 14, who does not have a history of cancer.  She has one paternal half sister, in her 73s or 69s.  Cynthia Frye reported that one of her sisters daughters was diagnosed  with cancer of an unknown type.  Cynthia Frye mother passed away at age 35 and did not have cancer.  Cynthia Frye had limited information about her maternal family members, and no maternal family history of cancer was reported. Cynthia Frye father passed away in his 70s and did not have cancer.  Cynthia Frye had limited information about her paternal family members.   Cynthia Frye is unaware of previous family history of genetic testing for hereditary cancer risks. Patient's maternal ancestors are of African American descent, and paternal ancestors are of African American descent. There is no reported Ashkenazi Jewish ancestry. There is no known consanguinity.  GENETIC TEST RESULTS: Genetic testing reported out on November 02, 2019.  The [facility] Common Hereditary Cancers Panel through [facility] found no pathogenic mutations. The Common Hereditary Cancers Panel offered by [facility] includes sequencing and/or deletion duplication testing of the following 48 genes: APC, ATM, AXIN2, BARD1, BMPR1A, BRCA1, BRCA2, BRIP1, CDH1, CDK4, CDKN2A (p14ARF), CDKN2A (p16INK4a), CHEK2, CTNNA1, DICER1, EPCAM (Deletion/duplication testing only), GREM1 (promoter region deletion/duplication testing only), KIT, MEN1, MLH1, MSH2, MSH3, MSH6, MUTYH, NBN, NF1, NHTL1, PALB2, PDGFRA, PMS2, POLD1, POLE, PTEN, RAD50, RAD51C, RAD51D, RNF43, SDHB, SDHC, SDHD, SMAD4, SMARCA4. STK11, TP53, TSC1, TSC2, and VHL.  The following genes were evaluated for sequence changes only: SDHA and HOXB13 c.251G>A variant only.  The test report has been scanned into EPIC and is located under the Molecular Pathology section of the Results Review tab.  A portion of the result report is included below for reference.     We discussed with Cynthia Frye that because current genetic testing is not perfect, it is possible there may be a gene mutation in one of these genes that current testing  cannot detect, but that chance is small.  We also discussed, that there could be  another gene that has not yet been discovered, or that we have not yet tested, that is responsible for the cancer diagnoses in the family. It is also possible there is a hereditary cause for the cancer in the family that Cynthia Frye did not inherit and therefore was not identified in her testing.  Therefore, it is important to remain in touch with cancer genetics in the future so that we can continue to offer Cynthia Frye the most up to date genetic testing.   ADDITIONAL GENETIC TESTING: We discussed with Cynthia Frye that there are other genes that are associated with increased cancer risk that can be analyzed. Should Cynthia Frye wish to pursue additional genetic testing, we are happy to discuss and coordinate this testing, at any time.    CANCER SCREENING RECOMMENDATIONS: Cynthia Frye test result is considered negative (normal).  This means that we have not identified a hereditary cause for her personal history of pancreatic cancer at this time. Most cancers happen by chance and this negative test suggests that her cancer may fall into this category.    While reassuring, this does not definitively rule out a hereditary predisposition to cancer. It is still possible that there could be genetic mutations that are undetectable by current technology. There could be genetic mutations in genes that have not been tested or identified to increase cancer risk.  Therefore, it is recommended she continue to follow the cancer management and screening guidelines provided by heroncology and primary healthcare provider.   An individual's cancer risk and medical management are not determined by genetic test results alone. Overall cancer risk assessment incorporates additional factors, including personal medical history, family history, and any available genetic information that may result in a personalized plan for cancer prevention and surveillance  RECOMMENDATIONS FOR FAMILY MEMBERS:  Individuals in this family might be at  some increased risk of developing cancer, over the general population risk, simply due to the family history of cancer.  We recommended women in this family have a yearly mammogram beginning at age 86, or 54 years younger than the earliest onset of cancer, an annual clinical breast exam, and perform monthly breast self-exams. Women in this family should also have a gynecological exam as recommended by their primary provider. All family members should be referred for colonoscopy starting at age 32.  FOLLOW-UP: Lastly, we discussed with Cynthia Frye that cancer genetics is a rapidly advancing field and it is possible that new genetic tests will be appropriate for her and/or her family members in the future. We encouraged her to remain in contact with cancer genetics on an annual basis so we can update her personal and family histories and let her know of advances in cancer genetics that may benefit this family.   Our contact number was provided. Cynthia Frye questions were answered to her satisfaction, and she knows she is welcome to call us at anytime with additional questions or concerns.   Shenee Wignall M. Joette Catching, [place], [facility] Certified [email or web address] [email or web address] (P) [phone]

## 2019-11-14 ENCOUNTER — Other Ambulatory Visit: Payer: Self-pay

## 2019-11-14 ENCOUNTER — Inpatient Hospital Stay (HOSPITAL_BASED_OUTPATIENT_CLINIC_OR_DEPARTMENT_OTHER): Payer: BC Managed Care – PPO | Admitting: Hematology

## 2019-11-14 ENCOUNTER — Inpatient Hospital Stay: Payer: BC Managed Care – PPO

## 2019-11-14 ENCOUNTER — Encounter: Payer: Self-pay | Admitting: Hematology

## 2019-11-14 VITALS — BP 124/85 | HR 123 | Temp 98.1°F | Resp 18 | Ht 67.0 in | Wt 177.1 lb

## 2019-11-14 DIAGNOSIS — F419 Anxiety disorder, unspecified: Secondary | ICD-10-CM | POA: Diagnosis not present

## 2019-11-14 DIAGNOSIS — R109 Unspecified abdominal pain: Secondary | ICD-10-CM | POA: Diagnosis not present

## 2019-11-14 DIAGNOSIS — R531 Weakness: Secondary | ICD-10-CM | POA: Diagnosis not present

## 2019-11-14 DIAGNOSIS — R112 Nausea with vomiting, unspecified: Secondary | ICD-10-CM | POA: Diagnosis not present

## 2019-11-14 DIAGNOSIS — J939 Pneumothorax, unspecified: Secondary | ICD-10-CM | POA: Diagnosis not present

## 2019-11-14 DIAGNOSIS — C250 Malignant neoplasm of head of pancreas: Secondary | ICD-10-CM

## 2019-11-14 DIAGNOSIS — Z5189 Encounter for other specified aftercare: Secondary | ICD-10-CM | POA: Diagnosis not present

## 2019-11-14 DIAGNOSIS — I10 Essential (primary) hypertension: Secondary | ICD-10-CM

## 2019-11-14 DIAGNOSIS — G43909 Migraine, unspecified, not intractable, without status migrainosus: Secondary | ICD-10-CM | POA: Diagnosis not present

## 2019-11-14 DIAGNOSIS — Z5111 Encounter for antineoplastic chemotherapy: Secondary | ICD-10-CM | POA: Diagnosis not present

## 2019-11-14 DIAGNOSIS — E860 Dehydration: Secondary | ICD-10-CM | POA: Diagnosis not present

## 2019-11-14 DIAGNOSIS — M329 Systemic lupus erythematosus, unspecified: Secondary | ICD-10-CM | POA: Diagnosis not present

## 2019-11-14 DIAGNOSIS — R634 Abnormal weight loss: Secondary | ICD-10-CM | POA: Diagnosis not present

## 2019-11-14 LAB — CBC WITH DIFFERENTIAL (CANCER CENTER ONLY)
Abs Immature Granulocytes: 0.1 10*3/uL — ABNORMAL HIGH (ref 0.00–0.07)
Basophils Absolute: 0 10*3/uL (ref 0.0–0.1)
Basophils Relative: 1 %
Eosinophils Absolute: 0.2 10*3/uL (ref 0.0–0.5)
Eosinophils Relative: 2 %
HCT: 27.1 % — ABNORMAL LOW (ref 36.0–46.0)
Hemoglobin: 9 g/dL — ABNORMAL LOW (ref 12.0–15.0)
Immature Granulocytes: 2 %
Lymphocytes Relative: 15 %
Lymphs Abs: 1 10*3/uL (ref 0.7–4.0)
MCH: 28.9 pg (ref 26.0–34.0)
MCHC: 33.2 g/dL (ref 30.0–36.0)
MCV: 87.1 fL (ref 80.0–100.0)
Monocytes Absolute: 0.7 10*3/uL (ref 0.1–1.0)
Monocytes Relative: 10 %
Neutro Abs: 4.4 10*3/uL (ref 1.7–7.7)
Neutrophils Relative %: 70 %
Platelet Count: 239 10*3/uL (ref 150–400)
RBC: 3.11 MIL/uL — ABNORMAL LOW (ref 3.87–5.11)
RDW: 15.5 % (ref 11.5–15.5)
WBC Count: 6.3 10*3/uL (ref 4.0–10.5)
nRBC: 0.3 % — ABNORMAL HIGH (ref 0.0–0.2)

## 2019-11-14 LAB — CMP (CANCER CENTER ONLY)
ALT: 65 U/L — ABNORMAL HIGH (ref 0–44)
AST: 42 U/L — ABNORMAL HIGH (ref 15–41)
Albumin: 3.5 g/dL (ref 3.5–5.0)
Alkaline Phosphatase: 121 U/L (ref 38–126)
Anion gap: 8 (ref 5–15)
BUN: 4 mg/dL — ABNORMAL LOW (ref 6–20)
CO2: 27 mmol/L (ref 22–32)
Calcium: 9.9 mg/dL (ref 8.9–10.3)
Chloride: 103 mmol/L (ref 98–111)
Creatinine: 0.85 mg/dL (ref 0.44–1.00)
GFR, Estimated: >60 mL/min (ref >60)
Glucose, Bld: 124 mg/dL — ABNORMAL HIGH (ref 70–99)
Potassium: 3.3 mmol/L — ABNORMAL LOW (ref 3.5–5.1)
Sodium: 138 mmol/L (ref 135–145)
Total Bilirubin: 0.5 mg/dL (ref 0.3–1.2)
Total Protein: 7.3 g/dL (ref 6.5–8.1)

## 2019-11-14 MED ORDER — POTASSIUM CHLORIDE 20 MEQ PO PACK: 20.0000 meq | PACK | Freq: Every day | ORAL | 1 refills | Status: DC

## 2019-11-14 MED ORDER — DRONABINOL 2.5 MG PO CAPS: 2.5000 mg | ORAL_CAPSULE | Freq: Two times a day (BID) | ORAL | 0 refills | Status: DC

## 2019-11-14 NOTE — Progress Notes (Signed)
[place]   [phone] [phone]   Clinic Follow up Note   Patient Care Team: Josetta Huddle, MD as PCP - General (Internal Medicine) Jonnie Finner, RN as Oncology Nurse Navigator Truitt Merle, MD as Consulting Physician (Oncology) Arta Silence, MD as Consulting Physician (Gastroenterology) [name], MD as Consulting Physician (General Surgery)  Date of Service:  11/14/2019  CHIEF COMPLAINT: f/u of Pancreatic Cancer  SUMMARY OF ONCOLOGIC HISTORY: Oncology History Overview Note  Cancer Staging Pancreatic cancer [facility]) Staging form: Exocrine Pancreas, AJCC 8th Edition - Clinical stage from 10/18/2019: Stage IV (cT3, cN1, cM1) - Signed by Truitt Merle, MD on 10/18/2019    Pancreatic cancer ([place])  09/19/2019 Imaging   CT AP w contrast 09/19/19  IMPRESSION: 1. Findings are highly concerning for probable primary pancreatic adenocarcinoma in the anterior aspect of the pancreatic head. Several prominent borderline enlarged lymph nodes are noted in the hepatoduodenal nodal station, and there are multiple indeterminate liver lesions which are highly concerning for probable hepatic metastases. Further evaluation with nonemergent abdominal MRI with and without IV gadolinium with MRCP is recommended in the near future to better evaluate these findings.   09/25/2019 Procedure   Upper EUS by Dr Paulita Fujita  IMPRESSION -There was no evidence of significant pathology in the left lobe of the liver.  -A few lymph nodes were visualized and measures in the peripancreatic region and porta hepata region.  -Hyperchoic material consistent with sludge was visualized endosonographically in the gallbladder.  -There was no sign of significant pathology in the common bile duct.  -A mass was identified in the pancreatic head. If biopsy results show adenocarcinoma, it would be staged T3N1Mx by endosonographic criteria. Fine needle aspiration performed.    09/25/2019  Initial Biopsy   FINAL MICROSCOPIC DIAGNOSIS: Fine needle aspirate, Pancreas;  MALIGNANT CELLS PRESENT CONSISTENT WITH ADENOCARCINOMA.    10/02/2019 Initial Diagnosis   Pancreatic cancer ([place])   10/11/2019 Procedure   PAC placement y Dr Barry Dienes    10/15/2019 Imaging   CT Chest  IMPRESSION: 1. Small anterior left pneumothorax with dependent atelectasis in the left lower lobe. 2. Increased number of bilateral axillary and subpectoral lymph nodes with mild lymphadenopathy in the left axilla. While this would be an atypical presentation for metastatic pancreatic cancer, this possibility is not excluded 3. Main duct dilatation in the pancreas, better assessed on abdomen CT 09/19/2019.   10/16/2019 Imaging   MRI Abdomen  IMPRESSION: 1. Substantially motion degraded scan. 2. Probable persistent small anterior left lung base pneumothorax, better seen on chest CT from 1 day prior. 3. Poorly marginated hypoenhancing 3.7 x 2.9 cm pancreatic head mass, which appears to invade the anterior peripancreatic fat, compatible with known pancreatic adenocarcinoma. Diffuse irregular dilatation of the main pancreatic duct in the pancreatic body and tail. Mild narrowing of the main portal vein by the mass. Abdominal vasculature remains patent and otherwise uninvolved. 4. Numerous (greater than 10) small liver masses scattered throughout the liver, largest 1.0 cm, which appear to demonstrate targetoid enhancement on the limited motion degraded postcontrast sequences, compatible with liver metastases. 5. Mild porta hepatis adenopathy, suspicious for metastatic disease.   10/18/2019 Cancer Staging   Staging form: Exocrine Pancreas, AJCC 8th Edition - Clinical stage from 10/18/2019: Stage IV (cT3, cN1, cM1) - Signed by Truitt Merle, MD on 10/18/2019   10/24/2019 -  Chemotherapy   First-line FOLFIRINOX q2weeks starting 10/24/19   10/30/2019 Pathology Results   FINAL MICROSCOPIC DIAGNOSIS:   A. LIVER, LESION,  BIOPSY:  -  Metastatic carcinoma  -  See comment   COMMENT:   By immunohistochemistry, the neoplastic cells are positive for  cytokeratin 7 and GATA3 with patchy nonspecific staining for PAX 8 but  negative for TTF-1, CDX2 and cytokeratin 20.  Overall, the findings are  consistent with metastasis of the patient's known breast carcinoma.  Prognostic panel (ER, PR, Her-2) is pending and will be reported in an  addendum.    ADDENDUM:   Dr. Laurence Ferrari notified us (November 01, 2019) that the patient was also  being worked up for a pancreatic mass.  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  In addition, ER and PR  are negative.  Gata-3 can be expressed in the pancreatic  adenocarcinomas; therefore, pancreatobiliary primary remains in the  differential.    11/02/2019 Genetic Testing   Negative genetic testing: no pathogenic variants detected in [employer] Common Hereditary Cancers Panel.  The report date is November 02, 2019.   The Common Hereditary Cancers Panel offered by [facility] includes sequencing and/or deletion duplication testing of the following 48 genes: APC, ATM, AXIN2, BARD1, BMPR1A, BRCA1, BRCA2, BRIP1, CDH1, CDK4, CDKN2A (p14ARF), CDKN2A (p16INK4a), CHEK2, CTNNA1, DICER1, EPCAM (Deletion/duplication testing only), GREM1 (promoter region deletion/duplication testing only), KIT, MEN1, MLH1, MSH2, MSH3, MSH6, MUTYH, NBN, NF1, NHTL1, PALB2, PDGFRA, PMS2, POLD1, POLE, PTEN, RAD50, RAD51C, RAD51D, RNF43, SDHB, SDHC, SDHD, SMAD4, SMARCA4. STK11, TP53, TSC1, TSC2, and VHL.  The following genes were evaluated for sequence changes only: SDHA and HOXB13 c.251G>A variant only.   11/04/2019 Imaging   CT AP  IMPRESSION: 1. Circumferential bowel wall thickening with adjacent fat stranding throughout the colon, most predominant in the transverse colon. This is consistent with colitis, which may be infectious or inflammatory in etiology. 2. Ill-defined  pancreatic head mass consistent with known malignancy, similar to mildly increased in comparison to prior CT. There are innumerable hypodense masses throughout the liver, increased in conspicuity in comparison to prior CT. Findings are worrisome for worsening metastatic disease. 3. Filling defect in the LEFT internal iliac vein, likely a thrombus with differential considerations including mixing artifact.      CURRENT THERAPY:  First-line FOLFIRINOX q2weeks starting 10/24/19. C2 postponed due to N/V/D and dose reduced 20-30%.   INTERVAL HISTORY:  JOHANNY SEGERS is here for a follow up. She presents to the clinic with her husband. She notes she is improving since hospital discharge, but not as fast as she wants She notes her stool improved, now watery. She notes 1 episode of vomiting based on how much she ate. Otherwise no other vomiting. She notes her appetite fluctuates. She notes her fatigue is still low. She noes potassium hard to swallow but willing to crush. She also notes Mirtazapine made her jittery so she stopped it. She notes she continues to tolerate Lovenox injections well.     REVIEW OF SYSTEMS:   Constitutional: Denies fevers, chills or abnormal weight loss (+) Fluctuating appetite (+)Fatigue  Eyes: Denies blurriness of vision Ears, nose, mouth, throat, and face: Denies mucositis or sore throat Respiratory: Denies cough, dyspnea or wheezes Cardiovascular: Denies palpitation, chest discomfort or lower extremity swelling Gastrointestinal:  Denies nausea, heartburn (+) Loose stool.  Skin: Denies abnormal skin rashes Lymphatics: Denies new lymphadenopathy or easy bruising Neurological:Denies numbness, tingling or new weaknesses Behavioral/Psych: Mood is stable, no new changes  All other systems were reviewed with the patient and are negative.  MEDICAL HISTORY:  Past Medical History:  Diagnosis  Date  . Allergies    peanuts, corn, beans, red grapefruit, naval oranges  .  Asthma    allergy shots and medication  . Cancer [facility])    pancreatic  . Collagen vascular disease ([place])   . DDD (degenerative disc disease), lumbar   . Eustachian tube dysfunction 12/2012   rhinitis, vertigo- Dr. Wilburn Cornelia, ENT   . Fibroids   . Heart murmur    Echo 1/18: EF 55-60, no RWMA, normal diastolic function, trivial AI, PASP 32  . History of cardiac catheterization    LHC 3/18: normal coronary arteries.   . History of nuclear stress test    ETT-Myoview 2/18: EF 62, + ECG response; apical and distal septal ischemia; intermediate risk.  Marland Kitchen Hypertension   . Iron deficiency anemia   . Lupus [facility]) 2011   Dr. Trudie Reed  . Migraine headache    trial of generic maxalt 10 mg, January 2021  . Obesity   . Seasonal allergies   . Seizure in childhood [facility])    as a child no treatment none x 30 years    SURGICAL HISTORY: Past Surgical History:  Procedure Laterality Date  . ABDOMINAL HYSTERECTOMY    . BACK SURGERY  2009  . COLONOSCOPY WITH PROPOFOL N/A 04/11/2012   Procedure: COLONOSCOPY WITH PROPOFOL;  Surgeon: Garlan Fair, MD;  Location: [facility] ENDOSCOPY;  Service: Endoscopy;  Laterality: N/A;  . HERNIA REPAIR     as child unbilical  . X9-J partial discectomy and laminectomy     Dr. Christella Noa  . LEFT HEART CATH AND CORONARY ANGIOGRAPHY N/A 04/09/2016   Procedure: Left Heart Cath and Coronary Angiography;  Surgeon: Nelva Bush, MD;  Location: [place] CV LAB;  Service: Cardiovascular;  Laterality: N/A;  . MOUTH SURGERY    . PORTACATH PLACEMENT N/A 10/11/2019   Procedure: INSERTION PORT-A-CATH LEFT SUBCLAVIAN;  Surgeon: [name], MD;  Location: [place];  Service: General;  Laterality: N/A;    I have reviewed the social history and family history with the patient and they are unchanged from previous note.  ALLERGIES:  is allergic to cinnamon, peanut-containing drug products, prednisone, corn oil, corn-containing products, and other.  MEDICATIONS:    Current Outpatient Medications  Medication Sig Dispense Refill  . acetaminophen (TYLENOL) 500 MG tablet Take 1,000 mg by mouth every 6 (six) hours as needed for moderate pain.    Marland Kitchen albuterol (PROVENTIL HFA;VENTOLIN HFA) 108 (90 Base) MCG/ACT inhaler Inhale 2 puffs into the lungs every 6 (six) hours as needed for wheezing or shortness of breath. 18 g 0  . ALPRAZolam (XANAX) 0.25 MG tablet Take 1 tablet (0.25 mg total) by mouth at bedtime as needed for anxiety. 20 tablet 0  . aspirin 81 MG tablet Take 81 mg by mouth daily.    . Bepotastine Besilate (BEPREVE) 1.5 % SOLN Place 1 drop into both eyes 2 (two) times daily as needed (DRY EYES).    . Cholecalciferol (VITAMIN D) 2000 units CAPS Take 2,000 Units by mouth daily.     Marland Kitchen dexamethasone (DECADRON) 4 MG tablet Take 1 tablet (4 mg total) by mouth daily. Take 1 tablet once daily for 3 to 5 days after chemo 20 tablet 0  . diclofenac (VOLTAREN) 75 MG EC tablet Take 75 mg by mouth 2 (two) times daily.    . Dietary Management Product (RHEUMATE) CAPS Take 1 capsule by mouth daily.     . diphenoxylate-atropine (LOMOTIL) 2.5-0.025 MG tablet Take 2 tablets by mouth 4 (  four) times daily as needed for diarrhea or loose stools (use 2nd, if Imodium doesn't work. Max dose: 8 tablets/day.). 20 tablet 0  . DIPROLENE 0.05 % ointment Apply 1 application topically as needed (skin irritation).     Marland Kitchen dronabinol (MARINOL) 2.5 MG capsule Take 1 capsule (2.5 mg total) by mouth 2 (two) times daily before a meal. 30 capsule 0  . enoxaparin (LOVENOX) 120 MG/0.8ML injection Inject 0.8 mLs (120 mg total) into the skin daily. 0.8 mL 30  . esomeprazole (NEXIUM) 10 MG packet Take 10 mg by mouth daily as needed (heartburn).     . feeding supplement (ENSURE ENLIVE / ENSURE PLUS) LIQD Take 237 mLs by mouth 2 (two) times daily between meals. 237 mL 12  . ferrous sulfate 325 (65 FE) MG tablet Take 325 mg by mouth daily with breakfast.    . fluticasone (FLONASE) 50 MCG/ACT nasal spray  Place 2 sprays into both nostrils daily. 16 g 12  . hydroxychloroquine (PLAQUENIL) 200 MG tablet Take 400 mg by mouth daily.   5  . lidocaine-prilocaine (EMLA) cream Apply to affected area once (Patient taking differently: Apply 1 application topically once. ) 30 g 3  . loperamide (IMODIUM) 2 MG capsule Take 2 capsules (4 mg total) by mouth 2 (two) times daily as needed for diarrhea or loose stools (Use 1st). 30 capsule 0  . loratadine (CLARITIN) 10 MG tablet Take 10 mg by mouth daily.    . methotrexate 50 MG/2ML injection Inject 25 mg into the skin once a week. 60m = 235m 3  . metoprolol succinate (TOPROL-XL) 50 MG 24 hr tablet Take 50 mg by mouth daily.     . ondansetron (ZOFRAN) 8 MG tablet Take 1 tablet (8 mg total) by mouth 2 (two) times daily as needed. Start on day 3 after chemotherapy. 30 tablet 1  . oxyCODONE (OXY IR/ROXICODONE) 5 MG immediate release tablet Take 1 tablet (5 mg total) by mouth every 6 (six) hours as needed for severe pain. 8 tablet 0  . potassium chloride (KLOR-CON) 20 MEQ packet Take 20 mEq by mouth daily. 30 packet 1  . potassium chloride SA (KLOR-CON) 20 MEQ tablet Take 1 tablet (20 mEq total) by mouth 2 (two) times daily. 60 tablet 1  . prochlorperazine (COMPAZINE) 10 MG tablet Take 1 tablet (10 mg total) by mouth every 6 (six) hours as needed (Nausea or vomiting). (Patient taking differently: Take 10 mg by mouth every 6 (six) hours as needed for nausea or vomiting. ) 30 tablet 1  . rizatriptan (MAXALT-MLT) 10 MG disintegrating tablet Take 1 tablet (10 mg total) by mouth as needed for migraine. May repeat in 2 hours if needed (Patient taking differently: Take 10 mg by mouth daily as needed for migraine. May repeat in 2 hours if needed) 9 tablet 11  . topiramate (TOPAMAX) 50 MG tablet Take 1 tablet (50 mg total) by mouth at bedtime.     No current facility-administered medications for this visit.    PHYSICAL EXAMINATION: ECOG PERFORMANCE STATUS: 2 - Symptomatic, <50%  confined to bed  Vitals:   11/14/19 0937  BP: 124/85  Pulse: (!) 123  Resp: 18  Temp: 98.1 F (36.7 C)  SpO2: 100%   Filed Weights   11/14/19 0937  Weight: 177 lb 1.6 oz (80.3 kg)    GENERAL:alert, no distress and comfortable SKIN: skin color, texture, turgor are normal, no rashes or significant lesions EYES: normal, Conjunctiva are pink and non-injected, sclera clear  NECK: supple, thyroid normal size, non-tender, without nodularity LYMPH:  no palpable lymphadenopathy in the cervical, axillary  LUNGS: clear to auscultation and percussion with normal breathing effort HEART: regular & rhythm and no murmurs and no lower extremity edema (+) Tachycardia  ABDOMEN:abdomen soft, non-tender and normal bowel sounds Musculoskeletal:no cyanosis of digits and no clubbing  NEURO: alert & oriented x 3 with fluent speech, no focal motor/sensory deficits  LABORATORY DATA:  I have reviewed the data as listed CBC Latest Ref Rng & Units 11/14/2019 11/09/2019 11/08/2019  WBC 4.0 - 10.5 K/uL 6.3 7.7 6.7  Hemoglobin 12.0 - 15.0 g/dL 9.0(L) 8.1(L) 7.6(L)  Hematocrit 36 - 46 % 27.1(L) 24.9(L) 23.3(L)  Platelets 150 - 400 K/uL 239 267 244     CMP Latest Ref Rng & Units 11/14/2019 11/09/2019 11/08/2019  Glucose 70 - 99 mg/dL 124(H) 86 81  BUN 6 - 20 mg/dL 4(L) <5(L) <5(L)  Creatinine 0.44 - 1.00 mg/dL 0.85 0.63 0.60  Sodium 135 - 145 mmol/L 138 140 137  Potassium 3.5 - 5.1 mmol/L 3.3(L) 3.9 3.3(L)  Chloride 98 - 111 mmol/L 103 108 106  CO2 22 - 32 mmol/L _0 Calcium 8.9 - 10.3 mg/dL 9.9 8.9 8.4(L)  Total Protein 6.5 - 8.1 g/dL 7.3 - -  Total Bilirubin 0.3 - 1.2 mg/dL 0.5 - -  Alkaline Phos 38 - 126 U/L 121 - -  AST 15 - 41 U/L 42(H) - -  ALT 0 - 44 U/L 65(H) - -      RADIOGRAPHIC STUDIES: I have personally reviewed the radiological images as listed and agreed with the findings in the report. No results found.   ASSESSMENT & PLAN:  Cynthia Frye is a 59 y.o. female with     1. 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 Her EUS biopsy with Dr Paulita Fujita on 09/25/19 shows pancreatic adenocarcinoma.  -Her 10/16/19 abdominal MRI showed multiple (>10) small liver lesions, most are consistent with metastatic lesions. Liver biopsy from 10/30/19 confirmed metastatic pancreatic cancer.  -I started her on first-line or neoadjuvant chemo with FOLFIRINOX q2weeks on 10/24/19. Goal of therapy is to control or shrink disease -FO results are still pending.  -S/p C1 she had a lot of side effects following her chemotherapy with nausea, vomiting, diarrhea. She was hospitalized on 11/04/19 and seen to develop colitis with N&V.  -She has been improving since hospital discharge. Her appetite fluctuates and her diarrhea and nausea improved. She is tachycardic today, but notes adequate liquid intake. She denies recent bleeding.  -F/u next week with NP Lacie for C2 chemo with 20-30% dose reduction of all chemo agents.   2. Upper abdominal cramps, Weight loss, N/V/D -Secondary to #1 and chemo.  -She has had intermittent upper abdominal cramping after certain meals. She also has had a gradual 40 pounds weight loss over 7 months (since 03/2019) -Her appetite fluctuates. Continue to f/u with dietician. I recommend she use nutritional supplement 1-3 times daily.   -Pt notes Mirtazapine gave her jitters and stopped. I also suggested Marinol, she is willing to try (11/14/19). -For pain Dr Inda Merlin gave her Hydrocodone, but not taken much so far.  -Diarrhea improved after recent hospitalization.   3. Comorbidities: Lupus, HTN, Migraines  -Symptomatic in her joints, Dx many years ago  -She is being followed by Rheumatologist Dr Wyline Copas and being treated with weekly Methotrexate injections,  Plaquenil andDiclofenac. -For Migraines is well controlled on Topomax. She has been seen by Dr  Jaynee Eagles.   4. Leftsmall pneumothorax -Secondary to port placement. Repeat CXR 10/18/19 shows enlarging but still small persistent pneum. Will continue to monitoring   5. Anxiety, Social/Financial Support  -She has been very anxious with the fast pace workup and unexpected diagnosis. -She lives with her husband in [place]. She does not have children. She does have a brother who lives close by but no other near relatives.  6. Left Axillary Lymphadenopathy  -Her CT chest from 10/15/19 showed multiple left axillary and subpectoral adenopathy. -Her left mammogram/US from 10/23/19 shows multiple left axillary LNs with cortical thickening up to 5-6 mm. Not palpable on exam. Radiology and I feel this is likely reactive to covid19 vaccine and the recommendation is short interval follow up.   7. Anemia  -Secondary to secondary to recent chemotherapy and underlying lupus -During recent hospitalization her Hg dropped to 7 on 11/06/19. Has slowly improved on its own.  -Will monitor and if drops again, will give blood transfusion, she is agreeable.   8. Left iliac vein DVT, new  -For hospital work up for N&V, her CT AP showed thrombosis of left internal iliac vein.  -She is currently on Lovenox injections, will continue     9. Hypokalemia  -secondary to recent diarrhea. Since recent hospitalization, her diarrhea has improved.  -On oral potassium once daily. She is fine to crush pill due to size.    PLAN: -I called in Marinol today  -Lab, flush, f/u and chemo FOLFIRINOX in 1, 3, 5 weeks with dose reduction of all agents .     No problem-specific Assessment & Plan notes found for this encounter.   No orders of the defined types were placed in this encounter.  All questions were answered. The patient knows to call the clinic with any problems, questions or concerns. No barriers to learning was detected. The total time spent in the appointment was 30 minutes.     Truitt Merle, MD 11/14/2019    I, Joslyn Devon, am acting as scribe for Truitt Merle, MD.   I have reviewed the above documentation for accuracy and completeness, and I agree with the above.

## 2019-11-15 ENCOUNTER — Telehealth: Payer: Self-pay | Admitting: Hematology

## 2019-11-15 NOTE — Telephone Encounter (Signed)
Scheduled appointments per 10/20 los. Spoke to patient who is aware of appointments dates and times.

## 2019-11-16 ENCOUNTER — Telehealth: Payer: Self-pay

## 2019-11-16 ENCOUNTER — Other Ambulatory Visit: Payer: Self-pay | Admitting: Nurse Practitioner

## 2019-11-16 ENCOUNTER — Other Ambulatory Visit: Payer: Self-pay | Admitting: Hematology

## 2019-11-16 MED ORDER — OXYCODONE HCL 5 MG PO TABS: 5.0000 mg | ORAL_TABLET | Freq: Four times a day (QID) | ORAL | 0 refills | Status: DC | PRN

## 2019-11-16 NOTE — Telephone Encounter (Signed)
Patient calls for refill on Oxycodone to be sent into [facility] pharmacy on [address] on file.  Last script was by Dr. Barry Dienes on 9/16 #8

## 2019-11-20 ENCOUNTER — Telehealth: Payer: Self-pay

## 2019-11-20 NOTE — Telephone Encounter (Signed)
Cynthia Frye states she is having increased "belly" pain.  She describes it as cramping.  She has not moved her bowels for 1 week.  She is passing gas, she was nauseated last pm and took a compazine with relief.  I have instructed her to increase her oral fluid intake that can include jello, pudding, ice cream, sherbert, etc, any thing that when it melts turns to liquid.  I instructed her to drink 1 cup on warm prune juice and take senokot s.  She has taken this in the past with results.  She verbalized understanding.  She has appt with Regan Rakers tomorrow.

## 2019-11-20 NOTE — Progress Notes (Signed)
[place]   Telephone:([phone] Fax:([phone]) [phone]   Clinic Follow up Note   Patient Care Team: Josetta Huddle, MD as PCP - General (Internal Medicine) Jonnie Finner, RN as Oncology Nurse Navigator Truitt Merle, MD as Consulting Physician (Oncology) Arta Silence, MD as Consulting Physician (Gastroenterology) [name], MD as Consulting Physician (General Surgery) 11/21/2019  CHIEF COMPLAINT: Follow-up pancreas cancer  SUMMARY OF ONCOLOGIC HISTORY: Oncology History Overview Note  Cancer Staging Pancreatic cancer [facility]) Staging form: Exocrine Pancreas, AJCC 8th Edition - Clinical stage from 10/18/2019: Stage IV (cT3, cN1, cM1) - Signed by Truitt Merle, MD on 10/18/2019    Pancreatic cancer ([place])  09/19/2019 Imaging   CT AP w contrast 09/19/19  IMPRESSION: 1. Findings are highly concerning for probable primary pancreatic adenocarcinoma in the anterior aspect of the pancreatic head. Several prominent borderline enlarged lymph nodes are noted in the hepatoduodenal nodal station, and there are multiple indeterminate liver lesions which are highly concerning for probable hepatic metastases. Further evaluation with nonemergent abdominal MRI with and without IV gadolinium with MRCP is recommended in the near future to better evaluate these findings.   09/25/2019 Procedure   Upper EUS by Dr Paulita Fujita  IMPRESSION -There was no evidence of significant pathology in the left lobe of the liver.  -A few lymph nodes were visualized and measures in the peripancreatic region and porta hepata region.  -Hyperchoic material consistent with sludge was visualized endosonographically in the gallbladder.  -There was no sign of significant pathology in the common bile duct.  -A mass was identified in the pancreatic head. If biopsy results show adenocarcinoma, it would be staged T3N1Mx by endosonographic criteria. Fine needle aspiration performed.    09/25/2019 Initial Biopsy    FINAL MICROSCOPIC DIAGNOSIS: Fine needle aspirate, Pancreas;  MALIGNANT CELLS PRESENT CONSISTENT WITH ADENOCARCINOMA.    10/02/2019 Initial Diagnosis   Pancreatic cancer ([place])   10/11/2019 Procedure   PAC placement y Dr Barry Dienes    10/15/2019 Imaging   CT Chest  IMPRESSION: 1. Small anterior left pneumothorax with dependent atelectasis in the left lower lobe. 2. Increased number of bilateral axillary and subpectoral lymph nodes with mild lymphadenopathy in the left axilla. While this would be an atypical presentation for metastatic pancreatic cancer, this possibility is not excluded 3. Main duct dilatation in the pancreas, better assessed on abdomen CT 09/19/2019.   10/16/2019 Imaging   MRI Abdomen  IMPRESSION: 1. Substantially motion degraded scan. 2. Probable persistent small anterior left lung base pneumothorax, better seen on chest CT from 1 day prior. 3. Poorly marginated hypoenhancing 3.7 x 2.9 cm pancreatic head mass, which appears to invade the anterior peripancreatic fat, compatible with known pancreatic adenocarcinoma. Diffuse irregular dilatation of the main pancreatic duct in the pancreatic body and tail. Mild narrowing of the main portal vein by the mass. Abdominal vasculature remains patent and otherwise uninvolved. 4. Numerous (greater than 10) small liver masses scattered throughout the liver, largest 1.0 cm, which appear to demonstrate targetoid enhancement on the limited motion degraded postcontrast sequences, compatible with liver metastases. 5. Mild porta hepatis adenopathy, suspicious for metastatic disease.   10/18/2019 Cancer Staging   Staging form: Exocrine Pancreas, AJCC 8th Edition - Clinical stage from 10/18/2019: Stage IV (cT3, cN1, cM1) - Signed by Truitt Merle, MD on 10/18/2019   10/24/2019 -  Chemotherapy   First-line FOLFIRINOX q2weeks starting 10/24/19   10/30/2019 Pathology Results   FINAL MICROSCOPIC DIAGNOSIS:   A. LIVER, LESION, BIOPSY:  -   Metastatic  carcinoma  -  See comment   COMMENT:   By immunohistochemistry, the neoplastic cells are positive for  cytokeratin 7 and GATA3 with patchy nonspecific staining for PAX 8 but  negative for TTF-1, CDX2 and cytokeratin 20.  Overall, the findings are  consistent with metastasis of the patient's known breast carcinoma.  Prognostic panel (ER, PR, Her-2) is pending and will be reported in an  addendum.    ADDENDUM:   Dr. Laurence Ferrari notified us (November 01, 2019) that the patient was also  being worked up for a pancreatic mass.  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  In addition, ER and PR  are negative.  Gata-3 can be expressed in the pancreatic  adenocarcinomas; therefore, pancreatobiliary primary remains in the  differential.    11/02/2019 Genetic Testing   Negative genetic testing: no pathogenic variants detected in [employer] Common Hereditary Cancers Panel.  The report date is November 02, 2019.   The Common Hereditary Cancers Panel offered by [facility] includes sequencing and/or deletion duplication testing of the following 48 genes: APC, ATM, AXIN2, BARD1, BMPR1A, BRCA1, BRCA2, BRIP1, CDH1, CDK4, CDKN2A (p14ARF), CDKN2A (p16INK4a), CHEK2, CTNNA1, DICER1, EPCAM (Deletion/duplication testing only), GREM1 (promoter region deletion/duplication testing only), KIT, MEN1, MLH1, MSH2, MSH3, MSH6, MUTYH, NBN, NF1, NHTL1, PALB2, PDGFRA, PMS2, POLD1, POLE, PTEN, RAD50, RAD51C, RAD51D, RNF43, SDHB, SDHC, SDHD, SMAD4, SMARCA4. STK11, TP53, TSC1, TSC2, and VHL.  The following genes were evaluated for sequence changes only: SDHA and HOXB13 c.251G>A variant only.   11/04/2019 Imaging   CT AP  IMPRESSION: 1. Circumferential bowel wall thickening with adjacent fat stranding throughout the colon, most predominant in the transverse colon. This is consistent with colitis, which may be infectious or inflammatory in etiology. 2. Ill-defined pancreatic head mass  consistent with known malignancy, similar to mildly increased in comparison to prior CT. There are innumerable hypodense masses throughout the liver, increased in conspicuity in comparison to prior CT. Findings are worrisome for worsening metastatic disease. 3. Filling defect in the LEFT internal iliac vein, likely a thrombus with differential considerations including mixing artifact.     CURRENT THERAPY: First-line FOLFIRINOX q2weeks starting 10/24/19. C2 postponed due to N/V/D and dose reduced 20-30%.   INTERVAL HISTORY: Cynthia Frye returns for follow-up and to resume chemo.  She was seen by Dr. Burr Medico on 10/20 to follow-up on her recent hospitalization.  She continues to gain strength.  She is out of bed all day but "up and down" with activities and rest.  Still a little weak.  She is trying to tolerate boost/Ensure but does not like the taste.  She had an episode of diet-related nausea, no vomiting.  She was constipated that eventually resolved with a BM last night into this morning.  Stools are soft.  Denies any bleeding.  She has abdominal cramping but no pain.  She continues Lovenox, no leg edema.  She does have 2 "knots" on the left forearm that appeared after blood draws.  No arm edema.  She denies any fever, chills, cough, chest pain, dyspnea, neuropathy.   MEDICAL HISTORY:  Past Medical History:  Diagnosis Date  . Allergies    peanuts, corn, beans, red grapefruit, naval oranges  . Asthma    allergy shots and medication  . Cancer [facility])    pancreatic  . Collagen vascular disease ([place])   . DDD (degenerative disc disease), lumbar   . Eustachian tube dysfunction 12/2012   rhinitis, vertigo- Dr. Wilburn Cornelia, ENT   .  Fibroids   . Heart murmur    Echo 1/18: EF 55-60, no RWMA, normal diastolic function, trivial AI, PASP 32  . History of cardiac catheterization    LHC 3/18: normal coronary arteries.   . History of nuclear stress test    ETT-Myoview 2/18: EF 62, + ECG response; apical and  distal septal ischemia; intermediate risk.  Marland Kitchen Hypertension   . Iron deficiency anemia   . Lupus [facility]) 2011   Dr. Trudie Reed  . Migraine headache    trial of generic maxalt 10 mg, January 2021  . Obesity   . Seasonal allergies   . Seizure in childhood [facility])    as a child no treatment none x 30 years    SURGICAL HISTORY: Past Surgical History:  Procedure Laterality Date  . ABDOMINAL HYSTERECTOMY    . BACK SURGERY  2009  . COLONOSCOPY WITH PROPOFOL N/A 04/11/2012   Procedure: COLONOSCOPY WITH PROPOFOL;  Surgeon: Garlan Fair, MD;  Location: [facility] ENDOSCOPY;  Service: Endoscopy;  Laterality: N/A;  . HERNIA REPAIR     as child unbilical  . D3-T partial discectomy and laminectomy     Dr. Christella Noa  . LEFT HEART CATH AND CORONARY ANGIOGRAPHY N/A 04/09/2016   Procedure: Left Heart Cath and Coronary Angiography;  Surgeon: Nelva Bush, MD;  Location: [place] CV LAB;  Service: Cardiovascular;  Laterality: N/A;  . MOUTH SURGERY    . PORTACATH PLACEMENT N/A 10/11/2019   Procedure: INSERTION PORT-A-CATH LEFT SUBCLAVIAN;  Surgeon: [name], MD;  Location: [place];  Service: General;  Laterality: N/A;    I have reviewed the social history and family history with the patient and they are unchanged from previous note.  ALLERGIES:  is allergic to cinnamon, peanut-containing drug products, prednisone, corn oil, corn-containing products, and other.  MEDICATIONS:  Current Outpatient Medications  Medication Sig Dispense Refill  . acetaminophen (TYLENOL) 500 MG tablet Take 1,000 mg by mouth every 6 (six) hours as needed for moderate pain.    Marland Kitchen albuterol (PROVENTIL HFA;VENTOLIN HFA) 108 (90 Base) MCG/ACT inhaler Inhale 2 puffs into the lungs every 6 (six) hours as needed for wheezing or shortness of breath. 18 g 0  . ALPRAZolam (XANAX) 0.25 MG tablet Take 1 tablet (0.25 mg total) by mouth at bedtime as needed for anxiety. 20 tablet 0  . aspirin 81 MG tablet Take 81 mg by mouth  daily.    . Bepotastine Besilate (BEPREVE) 1.5 % SOLN Place 1 drop into both eyes 2 (two) times daily as needed (DRY EYES).    . Cholecalciferol (VITAMIN D) 2000 units CAPS Take 2,000 Units by mouth daily.     Marland Kitchen dexamethasone (DECADRON) 4 MG tablet Take 1 tablet (4 mg total) by mouth daily. Take 1 tablet once daily for 3 to 5 days after chemo 20 tablet 0  . diclofenac (VOLTAREN) 75 MG EC tablet Take 75 mg by mouth 2 (two) times daily.    . Dietary Management Product (RHEUMATE) CAPS Take 1 capsule by mouth daily.     . diphenoxylate-atropine (LOMOTIL) 2.5-0.025 MG tablet Take 2 tablets by mouth 4 (four) times daily as needed for diarrhea or loose stools (use 2nd, if Imodium doesn't work. Max dose: 8 tablets/day.). 20 tablet 0  . DIPROLENE 0.05 % ointment Apply 1 application topically as needed (skin irritation).     Marland Kitchen dronabinol (MARINOL) 2.5 MG capsule Take 1 capsule (2.5 mg total) by mouth 2 (two) times daily before a meal. 30 capsule  0  . enoxaparin (LOVENOX) 120 MG/0.8ML injection Inject 0.8 mLs (120 mg total) into the skin daily. 0.8 mL 30  . esomeprazole (NEXIUM) 10 MG packet Take 10 mg by mouth daily as needed (heartburn).     . feeding supplement (ENSURE ENLIVE / ENSURE PLUS) LIQD Take 237 mLs by mouth 2 (two) times daily between meals. 237 mL 12  . ferrous sulfate 325 (65 FE) MG tablet Take 325 mg by mouth daily with breakfast.    . fluticasone (FLONASE) 50 MCG/ACT nasal spray Place 2 sprays into both nostrils daily. 16 g 12  . hydroxychloroquine (PLAQUENIL) 200 MG tablet Take 400 mg by mouth daily.   5  . KLOR-CON M20 20 MEQ tablet TAKE 1 TABLET BY MOUTH TWICE A DAY 60 tablet 1  . lidocaine-prilocaine (EMLA) cream Apply to affected area once (Patient taking differently: Apply 1 application topically once. ) 30 g 3  . loperamide (IMODIUM) 2 MG capsule Take 2 capsules (4 mg total) by mouth 2 (two) times daily as needed for diarrhea or loose stools (Use 1st). 30 capsule 0  . loratadine  (CLARITIN) 10 MG tablet Take 10 mg by mouth daily.    Marland Kitchen LORazepam (ATIVAN) 0.5 MG tablet Take 1 tablet (0.5 mg total) by mouth every 8 (eight) hours as needed for anxiety (and anticiaptory n/v. DO NOT TAKE WITH XANAX). 30 tablet 0  . methotrexate 50 MG/2ML injection Inject 25 mg into the skin once a week. 59m = 223m 3  . metoprolol succinate (TOPROL-XL) 50 MG 24 hr tablet Take 50 mg by mouth daily.     . ondansetron (ZOFRAN) 8 MG tablet Take 1 tablet (8 mg total) by mouth 2 (two) times daily as needed. Start on day 3 after chemotherapy. 30 tablet 1  . oxyCODONE (OXY IR/ROXICODONE) 5 MG immediate release tablet Take 1 tablet (5 mg total) by mouth every 6 (six) hours as needed for severe pain. 20 tablet 0  . potassium chloride 20 MEQ/15ML (10%) SOLN Take 15 mLs (20 mEq total) by mouth 2 (two) times daily. 750 mL 1  . prochlorperazine (COMPAZINE) 10 MG tablet Take 1 tablet (10 mg total) by mouth every 6 (six) hours as needed (Nausea or vomiting). (Patient taking differently: Take 10 mg by mouth every 6 (six) hours as needed for nausea or vomiting. ) 30 tablet 1  . rizatriptan (MAXALT-MLT) 10 MG disintegrating tablet Take 1 tablet (10 mg total) by mouth as needed for migraine. May repeat in 2 hours if needed (Patient taking differently: Take 10 mg by mouth daily as needed for migraine. May repeat in 2 hours if needed) 9 tablet 11  . topiramate (TOPAMAX) 50 MG tablet Take 1 tablet (50 mg total) by mouth at bedtime.     No current facility-administered medications for this visit.   Facility-Administered Medications Ordered in Other Visits  Medication Dose Route Frequency Provider Last Rate Last Admin  . atropine injection 0.5 mg  0.5 mg Intravenous Once PRN FeTruitt MerleMD      . dexamethasone (DECADRON) 10 mg in sodium chloride 0.9 % 50 mL IVPB  10 mg Intravenous Once FeTruitt MerleMD 204 mL/hr at 11/21/19 1117 10 mg at 11/21/19 1117  . fluorouracil (ADRUCIL) 4,000 mg in sodium chloride 0.9 % 70 mL chemo  infusion  2,000 mg/m2 (Treatment Plan Recorded) Intravenous 1 day or 1 dose FeTruitt MerleMD      . fosaprepitant (EMEND) 150 mg in sodium chloride 0.9 % 145  mL IVPB  150 mg Intravenous Once Truitt Merle, MD      . irinotecan (CAMPTOSAR) 240 mg in sodium chloride 0.9 % 500 mL chemo infusion  120 mg/m2 (Treatment Plan Recorded) Intravenous Once Truitt Merle, MD      . leucovorin 804 mg in sodium chloride 0.9 % 250 mL infusion  400 mg/m2 (Treatment Plan Recorded) Intravenous Once Truitt Merle, MD      . oxaliplatin (ELOXATIN) 120 mg in dextrose 5 % 500 mL chemo infusion  60 mg/m2 (Treatment Plan Recorded) Intravenous Once Truitt Merle, MD      . palonosetron (ALOXI) injection 0.25 mg  0.25 mg Intravenous Once Truitt Merle, MD        PHYSICAL EXAMINATION: ECOG PERFORMANCE STATUS: 1 - Symptomatic but completely ambulatory  Vitals:   11/21/19 1007  BP: (!) 142/90  Pulse: 99  Resp: 20  Temp: 98.1 F (36.7 C)  SpO2: 100%   Filed Weights   11/21/19 1007  Weight: 175 lb 9.6 oz (79.7 kg)    GENERAL:alert, no distress and comfortable SKIN: No rash EYES:  sclera clear LUNGS:  normal breathing effort HEART:  no lower extremity edema ABDOMEN:abdomen soft, non-tender and normal bowel sounds Musculoskeletal: 2 soft tissue nodules on the left lower forearm, no edema, ecchymosis, skin breakdown NEURO: alert & oriented x 3 with fluent speech PAC without erythema  LABORATORY DATA:  I have reviewed the data as listed CBC Latest Ref Rng & Units 11/21/2019 11/14/2019 11/09/2019  WBC 4.0 - 10.5 K/uL 4.6 6.3 7.7  Hemoglobin 12.0 - 15.0 g/dL 8.8(L) 9.0(L) 8.1(L)  Hematocrit 36 - 46 % 26.4(L) 27.1(L) 24.9(L)  Platelets 150 - 400 K/uL 253 239 267     CMP Latest Ref Rng & Units 11/21/2019 11/14/2019 11/09/2019  Glucose 70 - 99 mg/dL 87 124(H) 86  BUN 6 - 20 mg/dL 7 4(L) <5(L)  Creatinine 0.44 - 1.00 mg/dL 0.65 0.85 0.63  Sodium 135 - 145 mmol/L 139 138 140  Potassium 3.5 - 5.1 mmol/L 3.2(L) 3.3(L) 3.9  Chloride  98 - 111 mmol/L 101 103 108  CO2 22 - 32 mmol/L _0 Calcium 8.9 - 10.3 mg/dL 9.7 9.9 8.9  Total Protein 6.5 - 8.1 g/dL 7.1 7.3 -  Total Bilirubin 0.3 - 1.2 mg/dL 0.6 0.5 -  Alkaline Phos 38 - 126 U/L 124 121 -  AST 15 - 41 U/L 41 42(H) -  ALT 0 - 44 U/L 34 65(H) -      RADIOGRAPHIC STUDIES: I have personally reviewed the radiological images as listed and agreed with the findings in the report. No results found.   ASSESSMENT & PLAN: Cynthia Frye a 59 y.o.femalewith    1. 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 Her EUS biopsy with Dr Paulita Fujita on 09/25/19 shows pancreatic adenocarcinoma.  -Her CT Scan also showsseveral subcentimeterliver lesions suspicious for metastasis,also technically indeterminate. -CT chest from 10/15/19 and MRI abdomen from 9/21/21showedmultiple left axillary and subpectoral adenopathy, likely related to her recent Covid injection in left upper arm.  -Her abdominal MRI showed multiple(>10)small liver lesions, most are consistent with metastatic lesions.Consensus inGI tumor conferenceis to attempt IR liver biopsywhich is scheduled on 10/5, will request[facility]. -we reviewed her left mammogram/US from 9/28 which shows multiple left axillary LNs with cortical thickening up to 5-6 mm. Not palpable on my exam. Per review with Dr. Burr Medico and radiology,  this is likely reactive to covid19 vaccine and the recommendation is short interval follow up.  -She underwent port placement, chemo class, and previously consented to FOLFIRINOX chemo with palliative intent.  -S/p cycle 1 FOLFIRINOX on 10/24/2019, she developed nausea, vomiting, diarrhea, hospitalized 10/10-10/15 for colitis -She has recovered moderately well, will resume chemo with 20-30% dose reductions and plan for close monitoring  2.   Nausea/vomiting, dehydration, weakness  -Following cycle 1 FOLFIRINOX -Required ED visit for IV fluids, potassium, antiemetics and treated for UTI -Admitted 10/10-10/15 for N/V/D, diagnosed with colitis and found to have left iliac vein DVT, discharged on 10/15 -recovered moderately well, able to resume chemo 10/27 with dose reductions   3. Left iliac vein DVT -Tolerating Lovenox  4. Upper abdominal cramps, Weight loss -Secondary to #1  -She has had intermittent upper abdominal cramping after certain meals. She also has had a gradual 40 pounds weight lossover 7 months (since 03/2019) -Her appetite is otherwise stable.Continue to f/u with dietician. -She has oxycodone for her Lupus but doesn't take it. Epigastric pain mostly relieved with Nexium -She tried mirtazapine but developed jitters and then stopped.  Marinol has been prescribed but she is reluctant to take it.  5. Comorbidities: Lupus, HTN, Migraines  -Symptomatic in her joints, Dx many years ago  -She is being followed by Rheumatologist Dr Wyline Copas and being treated with weekly Methotrexate injections, Plaquenil andDiclofenac. -For Migraines is well controlled on Topomax. She has been seen by Dr Jaynee Eagles.   6. Leftsmall pneumothorax -Secondary to port placement -repeat CXR9/23/21 shows enlarging but still small persistent pneumo, she is asymptomatic  -monitoring -Repeat CXR in ED on 10/3 is stable  7. Anxiety, Social Support  -She has been very anxious with the fast pace workup and unexpected diagnosis. -She lives with her husband in [place]. She does not have children. She does have a brother who lives close by but no other near relatives. -She plans to apply for disability -She has been referred to social work, will ask them to follow-up   Disposition: Cynthia Frye appears stable.  She has recovered from recent hospitalization.  She continues to improve her energy and appetite.  Discussed nutrition and symptom  management for intermittent nausea, constipation, and cramping.  She has what appears to be a component of anticipatory nausea, I recommend Ativan and have sent a prescription.   We reviewed the CBC and CMP from today, labs adequate for treatment. We discussed changing potassium to liquid and she will take 20 mEq BID. She agrees to call if liquid is not tolerable.  We will follow-up on the pending CA 19-9 from today.  She will proceed with dose reduced FOLFIRINOX today as planned, IV fluids with pump DC on day 3.  We are holding G-CSF on day 3 due to dose reductions.   She will return for lab, follow-up, and supportive care next week.  The plan was discussed with Dr. Burr Medico.   Orders Placed This Encounter  Procedures  . Sample to Blood Bank    Standing Status:   Future    Standing Expiration Date:   11/20/2020   All questions were answered. The patient knows to call the clinic with any problems, questions or concerns. No barriers to learning were detected.  Total encounter time was 30 minutes.     Alla Feeling, NP 11/21/19

## 2019-11-21 ENCOUNTER — Inpatient Hospital Stay: Payer: BC Managed Care – PPO

## 2019-11-21 ENCOUNTER — Inpatient Hospital Stay (HOSPITAL_BASED_OUTPATIENT_CLINIC_OR_DEPARTMENT_OTHER): Payer: BC Managed Care – PPO | Admitting: Nurse Practitioner

## 2019-11-21 ENCOUNTER — Encounter: Payer: BC Managed Care – PPO | Admitting: Nutrition

## 2019-11-21 ENCOUNTER — Encounter: Payer: Self-pay | Admitting: Nurse Practitioner

## 2019-11-21 ENCOUNTER — Other Ambulatory Visit: Payer: Self-pay

## 2019-11-21 ENCOUNTER — Inpatient Hospital Stay: Payer: BC Managed Care – PPO | Admitting: Nutrition

## 2019-11-21 VITALS — BP 142/90 | HR 99 | Temp 98.1°F | Resp 20 | Ht 67.0 in | Wt 175.6 lb

## 2019-11-21 VITALS — BP 142/86 | HR 93

## 2019-11-21 DIAGNOSIS — M329 Systemic lupus erythematosus, unspecified: Secondary | ICD-10-CM | POA: Diagnosis not present

## 2019-11-21 DIAGNOSIS — Z5111 Encounter for antineoplastic chemotherapy: Secondary | ICD-10-CM | POA: Diagnosis not present

## 2019-11-21 DIAGNOSIS — R112 Nausea with vomiting, unspecified: Secondary | ICD-10-CM | POA: Diagnosis not present

## 2019-11-21 DIAGNOSIS — Z5189 Encounter for other specified aftercare: Secondary | ICD-10-CM | POA: Diagnosis not present

## 2019-11-21 DIAGNOSIS — C250 Malignant neoplasm of head of pancreas: Secondary | ICD-10-CM

## 2019-11-21 DIAGNOSIS — R109 Unspecified abdominal pain: Secondary | ICD-10-CM | POA: Diagnosis not present

## 2019-11-21 DIAGNOSIS — J939 Pneumothorax, unspecified: Secondary | ICD-10-CM | POA: Diagnosis not present

## 2019-11-21 DIAGNOSIS — G43909 Migraine, unspecified, not intractable, without status migrainosus: Secondary | ICD-10-CM | POA: Diagnosis not present

## 2019-11-21 DIAGNOSIS — E860 Dehydration: Secondary | ICD-10-CM | POA: Diagnosis not present

## 2019-11-21 DIAGNOSIS — F419 Anxiety disorder, unspecified: Secondary | ICD-10-CM | POA: Diagnosis not present

## 2019-11-21 DIAGNOSIS — C189 Malignant neoplasm of colon, unspecified: Secondary | ICD-10-CM | POA: Diagnosis not present

## 2019-11-21 DIAGNOSIS — Z7189 Other specified counseling: Secondary | ICD-10-CM

## 2019-11-21 DIAGNOSIS — Z95828 Presence of other vascular implants and grafts: Secondary | ICD-10-CM

## 2019-11-21 DIAGNOSIS — R634 Abnormal weight loss: Secondary | ICD-10-CM | POA: Diagnosis not present

## 2019-11-21 DIAGNOSIS — I10 Essential (primary) hypertension: Secondary | ICD-10-CM | POA: Diagnosis not present

## 2019-11-21 DIAGNOSIS — R531 Weakness: Secondary | ICD-10-CM | POA: Diagnosis not present

## 2019-11-21 LAB — CMP (CANCER CENTER ONLY)
ALT: 34 U/L (ref 0–44)
AST: 41 U/L (ref 15–41)
Albumin: 3.5 g/dL (ref 3.5–5.0)
Alkaline Phosphatase: 124 U/L (ref 38–126)
Anion gap: 11 (ref 5–15)
BUN: 7 mg/dL (ref 6–20)
CO2: 27 mmol/L (ref 22–32)
Calcium: 9.7 mg/dL (ref 8.9–10.3)
Chloride: 101 mmol/L (ref 98–111)
Creatinine: 0.65 mg/dL (ref 0.44–1.00)
GFR, Estimated: >60 mL/min (ref >60)
Glucose, Bld: 87 mg/dL (ref 70–99)
Potassium: 3.2 mmol/L — ABNORMAL LOW (ref 3.5–5.1)
Sodium: 139 mmol/L (ref 135–145)
Total Bilirubin: 0.6 mg/dL (ref 0.3–1.2)
Total Protein: 7.1 g/dL (ref 6.5–8.1)

## 2019-11-21 LAB — CBC WITH DIFFERENTIAL (CANCER CENTER ONLY)
Abs Immature Granulocytes: 0.01 10*3/uL (ref 0.00–0.07)
Basophils Absolute: 0.1 10*3/uL (ref 0.0–0.1)
Basophils Relative: 1 %
Eosinophils Absolute: 0 10*3/uL (ref 0.0–0.5)
Eosinophils Relative: 1 %
HCT: 26.4 % — ABNORMAL LOW (ref 36.0–46.0)
Hemoglobin: 8.8 g/dL — ABNORMAL LOW (ref 12.0–15.0)
Immature Granulocytes: 0 %
Lymphocytes Relative: 20 %
Lymphs Abs: 0.9 10*3/uL (ref 0.7–4.0)
MCH: 29.7 pg (ref 26.0–34.0)
MCHC: 33.3 g/dL (ref 30.0–36.0)
MCV: 89.2 fL (ref 80.0–100.0)
Monocytes Absolute: 0.6 10*3/uL (ref 0.1–1.0)
Monocytes Relative: 12 %
Neutro Abs: 3 10*3/uL (ref 1.7–7.7)
Neutrophils Relative %: 66 %
Platelet Count: 253 10*3/uL (ref 150–400)
RBC: 2.96 MIL/uL — ABNORMAL LOW (ref 3.87–5.11)
RDW: 15.7 % — ABNORMAL HIGH (ref 11.5–15.5)
WBC Count: 4.6 10*3/uL (ref 4.0–10.5)
nRBC: 0 % (ref 0.0–0.2)

## 2019-11-21 MED ORDER — SODIUM CHLORIDE 0.9 % IV SOLN
400.0000 mg/m2 | Freq: Once | INTRAVENOUS | Status: AC
  Administered 2019-11-21: 804 mg via INTRAVENOUS
  Filled 2019-11-21: qty 40.2

## 2019-11-21 MED ORDER — ATROPINE SULFATE 1 MG/ML IJ SOLN
INTRAMUSCULAR | Status: AC
  Filled 2019-11-21: qty 1

## 2019-11-21 MED ORDER — POTASSIUM CHLORIDE 20 MEQ/15ML (10%) PO SOLN: 20.0000 meq | Freq: Two times a day (BID) | ORAL | 1 refills | Status: DC

## 2019-11-21 MED ORDER — DEXTROSE 5 % IV SOLN
Freq: Once | INTRAVENOUS | Status: AC
  Filled 2019-11-21: qty 250

## 2019-11-21 MED ORDER — SODIUM CHLORIDE 0.9% FLUSH
10.0000 mL | Freq: Once | INTRAVENOUS | Status: AC
  Administered 2019-11-21: 10 mL
  Filled 2019-11-21: qty 10

## 2019-11-21 MED ORDER — ATROPINE SULFATE 1 MG/ML IJ SOLN
0.5000 mg | Freq: Once | INTRAMUSCULAR | Status: AC | PRN
  Administered 2019-11-21: 0.5 mg via INTRAVENOUS

## 2019-11-21 MED ORDER — ATROPINE SULFATE 1 MG/ML IJ SOLN
0.5000 mg | Freq: Once | INTRAMUSCULAR | Status: AC
  Administered 2019-11-21: 0.5 mg via INTRAVENOUS

## 2019-11-21 MED ORDER — SODIUM CHLORIDE 0.9 % IV SOLN
150.0000 mg | Freq: Once | INTRAVENOUS | Status: AC
  Administered 2019-11-21: 150 mg via INTRAVENOUS
  Filled 2019-11-21: qty 150

## 2019-11-21 MED ORDER — LORAZEPAM 0.5 MG PO TABS: 0.5000 mg | ORAL_TABLET | Freq: Three times a day (TID) | ORAL | 0 refills | Status: DC | PRN

## 2019-11-21 MED ORDER — PALONOSETRON HCL INJECTION 0.25 MG/5ML
INTRAVENOUS | Status: AC
  Filled 2019-11-21: qty 5

## 2019-11-21 MED ORDER — SODIUM CHLORIDE 0.9 % IV SOLN
2000.0000 mg/m2 | INTRAVENOUS | Status: DC
  Administered 2019-11-21: 4000 mg via INTRAVENOUS
  Filled 2019-11-21: qty 80

## 2019-11-21 MED ORDER — SODIUM CHLORIDE 0.9 % IV SOLN
10.0000 mg | Freq: Once | INTRAVENOUS | Status: AC
  Administered 2019-11-21: 10 mg via INTRAVENOUS
  Filled 2019-11-21: qty 10

## 2019-11-21 MED ORDER — OXALIPLATIN CHEMO INJECTION 100 MG/20ML
60.0000 mg/m2 | Freq: Once | INTRAVENOUS | Status: AC
  Administered 2019-11-21: 120 mg via INTRAVENOUS
  Filled 2019-11-21: qty 20

## 2019-11-21 MED ORDER — SODIUM CHLORIDE 0.9 % IV SOLN
120.0000 mg/m2 | Freq: Once | INTRAVENOUS | Status: AC
  Administered 2019-11-21: 240 mg via INTRAVENOUS
  Filled 2019-11-21: qty 12

## 2019-11-21 MED ORDER — PALONOSETRON HCL INJECTION 0.25 MG/5ML
0.2500 mg | Freq: Once | INTRAVENOUS | Status: AC
  Administered 2019-11-21: 0.25 mg via INTRAVENOUS

## 2019-11-21 NOTE — Patient Instructions (Signed)
[place] Discharge Instructions for Patients Receiving Chemotherapy  Today you received the following chemotherapy agents oxaliplatin/leucovorin/florouracil/irinotecan   To help prevent nausea and vomiting after your treatment, we encourage you to take your nausea medication as directed   If you develop nausea and vomiting that is not controlled by your nausea medication, call the clinic.   BELOW ARE SYMPTOMS THAT SHOULD BE REPORTED IMMEDIATELY:  *FEVER GREATER THAN 100.5 F  *CHILLS WITH OR WITHOUT FEVER  NAUSEA AND VOMITING THAT IS NOT CONTROLLED WITH YOUR NAUSEA MEDICATION  *UNUSUAL SHORTNESS OF BREATH  *UNUSUAL BRUISING OR BLEEDING  TENDERNESS IN MOUTH AND THROAT WITH OR WITHOUT PRESENCE OF ULCERS  *URINARY PROBLEMS  *BOWEL PROBLEMS  UNUSUAL RASH Items with * indicate a potential emergency and should be followed up as soon as possible.  Feel free to call the clinic you have any questions or concerns. The clinic phone number is [phone].

## 2019-11-21 NOTE — Progress Notes (Signed)
Nutrition follow-up completed with patient during infusion for pancreas cancer. Weight decreased and was documented as 175.6 pounds on October 27 down from 187.7 pounds September 30. Noted labs: Glucose 124, potassium 3.3. Appetite remains low. Reports constipation. Dislikes anything too sweet and reports that is why she cannot drink oral nutrition supplements.  Nutrition diagnosis: Food and nutrition related knowledge deficit continues.  Intervention: Educated patient on strategies for increasing overall calories and protein in small frequent meals and snacks. Reviewed strategies for altering taste of food.  Provided multiple suggestions for decreasing sweetness of smoothies and oral nutrition supplements. Encouraged bowel regimen. Stressed importance of weight maintenance.  Monitoring, evaluation, goals: Patient will tolerate adequate calories and protein to minimize weight loss.  Next visit: Thursday, November 11 during infusion.  **Disclaimer: This note was dictated with voice recognition software. Similar sounding words can inadvertently be transcribed and this note may contain transcription errors which may not have been corrected upon publication of note.**

## 2019-11-22 ENCOUNTER — Telehealth: Payer: Self-pay

## 2019-11-22 ENCOUNTER — Telehealth: Payer: Self-pay | Admitting: Nurse Practitioner

## 2019-11-22 LAB — CANCER ANTIGEN 19-9: CA 19-9: 59238 U/mL — ABNORMAL HIGH (ref 0–35)

## 2019-11-22 NOTE — Telephone Encounter (Signed)
Patient called back stating the Oxycodone did help some with her cramping.  No more bowel movements.  She does report she went a week with a bowel movement.  I explained she probably is still backed up with stool.  She plans on taking her stool softener today and take the Oxycodone as prescribed for pain.  She will call our office her symptoms worsen.

## 2019-11-22 NOTE — Telephone Encounter (Signed)
Scheduled per 10/27 los. Pt is aware of appt times and date.

## 2019-11-22 NOTE — Telephone Encounter (Signed)
Patient calls with complaint of stomach cramps this morning, having some nausea, no vomiting. She has had two bowel movements which were formed. She took compazine for the nausea.  She has not taken her pain medication.  I instructed her to try taking Oxycodone as prescribed.  She agreed to try this and call back if no relief.

## 2019-11-23 ENCOUNTER — Other Ambulatory Visit: Payer: Self-pay

## 2019-11-23 ENCOUNTER — Inpatient Hospital Stay: Payer: BC Managed Care – PPO

## 2019-11-23 ENCOUNTER — Inpatient Hospital Stay: Payer: BC Managed Care – PPO | Admitting: General Practice

## 2019-11-23 VITALS — BP 139/88 | HR 98 | Temp 98.2°F | Resp 18

## 2019-11-23 DIAGNOSIS — Z95828 Presence of other vascular implants and grafts: Secondary | ICD-10-CM

## 2019-11-23 DIAGNOSIS — Z7189 Other specified counseling: Secondary | ICD-10-CM | POA: Diagnosis not present

## 2019-11-23 DIAGNOSIS — G43709 Chronic migraine without aura, not intractable, without status migrainosus: Secondary | ICD-10-CM | POA: Diagnosis not present

## 2019-11-23 DIAGNOSIS — E440 Moderate protein-calorie malnutrition: Secondary | ICD-10-CM | POA: Diagnosis not present

## 2019-11-23 DIAGNOSIS — Z8507 Personal history of malignant neoplasm of pancreas: Secondary | ICD-10-CM | POA: Diagnosis not present

## 2019-11-23 DIAGNOSIS — J302 Other seasonal allergic rhinitis: Secondary | ICD-10-CM | POA: Diagnosis not present

## 2019-11-23 DIAGNOSIS — K2950 Unspecified chronic gastritis without bleeding: Secondary | ICD-10-CM | POA: Diagnosis not present

## 2019-11-23 DIAGNOSIS — R000 Tachycardia, unspecified: Secondary | ICD-10-CM | POA: Diagnosis present

## 2019-11-23 DIAGNOSIS — K224 Dyskinesia of esophagus: Secondary | ICD-10-CM | POA: Diagnosis present

## 2019-11-23 DIAGNOSIS — E860 Dehydration: Secondary | ICD-10-CM | POA: Diagnosis not present

## 2019-11-23 DIAGNOSIS — K2970 Gastritis, unspecified, without bleeding: Secondary | ICD-10-CM | POA: Diagnosis not present

## 2019-11-23 DIAGNOSIS — Z20822 Contact with and (suspected) exposure to covid-19: Secondary | ICD-10-CM | POA: Diagnosis not present

## 2019-11-23 DIAGNOSIS — T451X5A Adverse effect of antineoplastic and immunosuppressive drugs, initial encounter: Secondary | ICD-10-CM | POA: Diagnosis not present

## 2019-11-23 DIAGNOSIS — E6609 Other obesity due to excess calories: Secondary | ICD-10-CM | POA: Diagnosis not present

## 2019-11-23 DIAGNOSIS — K5900 Constipation, unspecified: Secondary | ICD-10-CM | POA: Diagnosis not present

## 2019-11-23 DIAGNOSIS — K529 Noninfective gastroenteritis and colitis, unspecified: Secondary | ICD-10-CM | POA: Diagnosis not present

## 2019-11-23 DIAGNOSIS — C250 Malignant neoplasm of head of pancreas: Secondary | ICD-10-CM

## 2019-11-23 DIAGNOSIS — R1312 Dysphagia, oropharyngeal phase: Secondary | ICD-10-CM | POA: Diagnosis not present

## 2019-11-23 DIAGNOSIS — M329 Systemic lupus erythematosus, unspecified: Secondary | ICD-10-CM | POA: Diagnosis not present

## 2019-11-23 DIAGNOSIS — E876 Hypokalemia: Secondary | ICD-10-CM | POA: Diagnosis not present

## 2019-11-23 DIAGNOSIS — K2210 Ulcer of esophagus without bleeding: Secondary | ICD-10-CM | POA: Diagnosis not present

## 2019-11-23 DIAGNOSIS — Z515 Encounter for palliative care: Secondary | ICD-10-CM | POA: Diagnosis not present

## 2019-11-23 DIAGNOSIS — D630 Anemia in neoplastic disease: Secondary | ICD-10-CM | POA: Diagnosis not present

## 2019-11-23 DIAGNOSIS — J45909 Unspecified asthma, uncomplicated: Secondary | ICD-10-CM | POA: Diagnosis not present

## 2019-11-23 DIAGNOSIS — R1084 Generalized abdominal pain: Secondary | ICD-10-CM | POA: Diagnosis not present

## 2019-11-23 DIAGNOSIS — R112 Nausea with vomiting, unspecified: Secondary | ICD-10-CM | POA: Diagnosis not present

## 2019-11-23 DIAGNOSIS — R109 Unspecified abdominal pain: Secondary | ICD-10-CM | POA: Diagnosis not present

## 2019-11-23 DIAGNOSIS — K3189 Other diseases of stomach and duodenum: Secondary | ICD-10-CM | POA: Diagnosis not present

## 2019-11-23 DIAGNOSIS — D6481 Anemia due to antineoplastic chemotherapy: Secondary | ICD-10-CM | POA: Diagnosis not present

## 2019-11-23 DIAGNOSIS — R9431 Abnormal electrocardiogram [ECG] [EKG]: Secondary | ICD-10-CM | POA: Diagnosis not present

## 2019-11-23 DIAGNOSIS — N390 Urinary tract infection, site not specified: Secondary | ICD-10-CM | POA: Diagnosis not present

## 2019-11-23 DIAGNOSIS — R1110 Vomiting, unspecified: Secondary | ICD-10-CM | POA: Diagnosis not present

## 2019-11-23 DIAGNOSIS — R1310 Dysphagia, unspecified: Secondary | ICD-10-CM | POA: Diagnosis not present

## 2019-11-23 DIAGNOSIS — C787 Secondary malignant neoplasm of liver and intrahepatic bile duct: Secondary | ICD-10-CM | POA: Diagnosis not present

## 2019-11-23 DIAGNOSIS — I82422 Acute embolism and thrombosis of left iliac vein: Secondary | ICD-10-CM | POA: Diagnosis not present

## 2019-11-23 DIAGNOSIS — I10 Essential (primary) hypertension: Secondary | ICD-10-CM | POA: Diagnosis not present

## 2019-11-23 DIAGNOSIS — C799 Secondary malignant neoplasm of unspecified site: Secondary | ICD-10-CM | POA: Diagnosis not present

## 2019-11-23 DIAGNOSIS — R110 Nausea: Secondary | ICD-10-CM | POA: Diagnosis not present

## 2019-11-23 DIAGNOSIS — R531 Weakness: Secondary | ICD-10-CM | POA: Diagnosis not present

## 2019-11-23 DIAGNOSIS — R933 Abnormal findings on diagnostic imaging of other parts of digestive tract: Secondary | ICD-10-CM | POA: Diagnosis not present

## 2019-11-23 DIAGNOSIS — K2211 Ulcer of esophagus with bleeding: Secondary | ICD-10-CM | POA: Diagnosis not present

## 2019-11-23 MED ORDER — SODIUM CHLORIDE 0.9 % IV SOLN
Freq: Once | INTRAVENOUS | Status: AC
  Filled 2019-11-23: qty 250

## 2019-11-23 NOTE — Progress Notes (Signed)
[facility] CSW Progress Notes  Met w patient in infusion per referral for anxiety and general support.  Patient presented w visible symptoms of anxiety - shaking leg, tense body.  "This has been such a whirlwind."  Many appointments, tests and similar.  Did not respond well to initial chemotherapy, resulting in inpatient hospitalization.  Having difficulty w eating, both salt and sugar are too intense for her - subsisting on chicken at this point.  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  When alone, she becomes tearful, anxious about her medical situation.  Discussed need to learn/practice ways to reduce anxious feelings and allow body and mind to rest in the midst of difficult circumstances.  Also discussed the need for support from professionals as well as peer mentors.  Provided her with information on Pancreatic Cancer peer mentors, [place].  Referred to [facility] Counseling intern and CSW will continue to follow her while in infusion.  Edwyna Shell, LCSW Clinical Social Worker Phone:  [phone]

## 2019-11-23 NOTE — Patient Instructions (Signed)

## 2019-11-24 ENCOUNTER — Inpatient Hospital Stay (HOSPITAL_COMMUNITY)
Admission: EM | Admit: 2019-11-24 | Discharge: 2019-12-12 | DRG: 436 | Disposition: A | Discharge status: Home Health | Payer: BC Managed Care – PPO | Attending: Internal Medicine | Admitting: Internal Medicine

## 2019-11-24 ENCOUNTER — Encounter (HOSPITAL_COMMUNITY): Payer: Self-pay | Admitting: *Deleted

## 2019-11-24 ENCOUNTER — Observation Stay (HOSPITAL_COMMUNITY): Payer: BC Managed Care – PPO

## 2019-11-24 ENCOUNTER — Emergency Department (HOSPITAL_COMMUNITY): Payer: BC Managed Care – PPO

## 2019-11-24 DIAGNOSIS — K224 Dyskinesia of esophagus: Secondary | ICD-10-CM | POA: Diagnosis present

## 2019-11-24 DIAGNOSIS — Z20822 Contact with and (suspected) exposure to covid-19: Secondary | ICD-10-CM | POA: Diagnosis present

## 2019-11-24 DIAGNOSIS — R1110 Vomiting, unspecified: Secondary | ICD-10-CM | POA: Diagnosis not present

## 2019-11-24 DIAGNOSIS — Z7902 Long term (current) use of antithrombotics/antiplatelets: Secondary | ICD-10-CM

## 2019-11-24 DIAGNOSIS — Z6826 Body mass index (BMI) 26.0-26.9, adult: Secondary | ICD-10-CM

## 2019-11-24 DIAGNOSIS — G43709 Chronic migraine without aura, not intractable, without status migrainosus: Secondary | ICD-10-CM

## 2019-11-24 DIAGNOSIS — Z91010 Allergy to peanuts: Secondary | ICD-10-CM

## 2019-11-24 DIAGNOSIS — Z833 Family history of diabetes mellitus: Secondary | ICD-10-CM

## 2019-11-24 DIAGNOSIS — I82422 Acute embolism and thrombosis of left iliac vein: Secondary | ICD-10-CM | POA: Diagnosis not present

## 2019-11-24 DIAGNOSIS — R112 Nausea with vomiting, unspecified: Secondary | ICD-10-CM | POA: Diagnosis not present

## 2019-11-24 DIAGNOSIS — D6481 Anemia due to antineoplastic chemotherapy: Secondary | ICD-10-CM | POA: Diagnosis present

## 2019-11-24 DIAGNOSIS — Z8249 Family history of ischemic heart disease and other diseases of the circulatory system: Secondary | ICD-10-CM

## 2019-11-24 DIAGNOSIS — C787 Secondary malignant neoplasm of liver and intrahepatic bile duct: Secondary | ICD-10-CM | POA: Diagnosis present

## 2019-11-24 DIAGNOSIS — Z888 Allergy status to other drugs, medicaments and biological substances status: Secondary | ICD-10-CM

## 2019-11-24 DIAGNOSIS — R1310 Dysphagia, unspecified: Secondary | ICD-10-CM

## 2019-11-24 DIAGNOSIS — I10 Essential (primary) hypertension: Secondary | ICD-10-CM | POA: Diagnosis present

## 2019-11-24 DIAGNOSIS — J302 Other seasonal allergic rhinitis: Secondary | ICD-10-CM

## 2019-11-24 DIAGNOSIS — D630 Anemia in neoplastic disease: Secondary | ICD-10-CM | POA: Diagnosis present

## 2019-11-24 DIAGNOSIS — K5900 Constipation, unspecified: Secondary | ICD-10-CM | POA: Diagnosis present

## 2019-11-24 DIAGNOSIS — Z825 Family history of asthma and other chronic lower respiratory diseases: Secondary | ICD-10-CM

## 2019-11-24 DIAGNOSIS — F419 Anxiety disorder, unspecified: Secondary | ICD-10-CM | POA: Diagnosis present

## 2019-11-24 DIAGNOSIS — Z7952 Long term (current) use of systemic steroids: Secondary | ICD-10-CM

## 2019-11-24 DIAGNOSIS — Z79899 Other long term (current) drug therapy: Secondary | ICD-10-CM

## 2019-11-24 DIAGNOSIS — M329 Systemic lupus erythematosus, unspecified: Secondary | ICD-10-CM | POA: Diagnosis present

## 2019-11-24 DIAGNOSIS — I82409 Acute embolism and thrombosis of unspecified deep veins of unspecified lower extremity: Secondary | ICD-10-CM | POA: Diagnosis present

## 2019-11-24 DIAGNOSIS — C250 Malignant neoplasm of head of pancreas: Principal | ICD-10-CM

## 2019-11-24 DIAGNOSIS — E6609 Other obesity due to excess calories: Secondary | ICD-10-CM | POA: Diagnosis present

## 2019-11-24 DIAGNOSIS — E440 Moderate protein-calorie malnutrition: Secondary | ICD-10-CM | POA: Diagnosis present

## 2019-11-24 DIAGNOSIS — E876 Hypokalemia: Secondary | ICD-10-CM

## 2019-11-24 DIAGNOSIS — Z9102 Food additives allergy status: Secondary | ICD-10-CM

## 2019-11-24 DIAGNOSIS — T451X5A Adverse effect of antineoplastic and immunosuppressive drugs, initial encounter: Secondary | ICD-10-CM | POA: Diagnosis present

## 2019-11-24 DIAGNOSIS — Z823 Family history of stroke: Secondary | ICD-10-CM

## 2019-11-24 DIAGNOSIS — Z91018 Allergy to other foods: Secondary | ICD-10-CM

## 2019-11-24 DIAGNOSIS — C259 Malignant neoplasm of pancreas, unspecified: Secondary | ICD-10-CM | POA: Diagnosis present

## 2019-11-24 DIAGNOSIS — N390 Urinary tract infection, site not specified: Secondary | ICD-10-CM | POA: Diagnosis present

## 2019-11-24 DIAGNOSIS — R9431 Abnormal electrocardiogram [ECG] [EKG]: Secondary | ICD-10-CM | POA: Diagnosis present

## 2019-11-24 DIAGNOSIS — J45909 Unspecified asthma, uncomplicated: Secondary | ICD-10-CM | POA: Diagnosis present

## 2019-11-24 DIAGNOSIS — R1084 Generalized abdominal pain: Secondary | ICD-10-CM

## 2019-11-24 DIAGNOSIS — R1312 Dysphagia, oropharyngeal phase: Secondary | ICD-10-CM | POA: Diagnosis present

## 2019-11-24 DIAGNOSIS — E860 Dehydration: Secondary | ICD-10-CM | POA: Diagnosis present

## 2019-11-24 DIAGNOSIS — R000 Tachycardia, unspecified: Secondary | ICD-10-CM

## 2019-11-24 DIAGNOSIS — R110 Nausea: Secondary | ICD-10-CM | POA: Diagnosis not present

## 2019-11-24 DIAGNOSIS — K2970 Gastritis, unspecified, without bleeding: Secondary | ICD-10-CM | POA: Diagnosis present

## 2019-11-24 DIAGNOSIS — K2210 Ulcer of esophagus without bleeding: Secondary | ICD-10-CM | POA: Diagnosis present

## 2019-11-24 LAB — CBC WITH DIFFERENTIAL/PLATELET
Abs Immature Granulocytes: 0.02 10*3/uL (ref 0.00–0.07)
Basophils Absolute: 0 10*3/uL (ref 0.0–0.1)
Basophils Relative: 0 %
Eosinophils Absolute: 0 10*3/uL (ref 0.0–0.5)
Eosinophils Relative: 0 %
HCT: 25 % — ABNORMAL LOW (ref 36.0–46.0)
Hemoglobin: 8.2 g/dL — ABNORMAL LOW (ref 12.0–15.0)
Immature Granulocytes: 0 %
Lymphocytes Relative: 12 %
Lymphs Abs: 0.7 10*3/uL (ref 0.7–4.0)
MCH: 29.8 pg (ref 26.0–34.0)
MCHC: 32.8 g/dL (ref 30.0–36.0)
MCV: 90.9 fL (ref 80.0–100.0)
Monocytes Absolute: 0.3 10*3/uL (ref 0.1–1.0)
Monocytes Relative: 4 %
Neutro Abs: 4.7 10*3/uL (ref 1.7–7.7)
Neutrophils Relative %: 84 %
Platelets: 160 10*3/uL (ref 150–400)
RBC: 2.75 MIL/uL — ABNORMAL LOW (ref 3.87–5.11)
RDW: 15.4 % (ref 11.5–15.5)
WBC: 5.6 10*3/uL (ref 4.0–10.5)
nRBC: 0 % (ref 0.0–0.2)

## 2019-11-24 LAB — BASIC METABOLIC PANEL
Anion gap: 12 (ref 5–15)
BUN: 8 mg/dL (ref 6–20)
CO2: 27 mmol/L (ref 22–32)
Calcium: 9.1 mg/dL (ref 8.9–10.3)
Chloride: 97 mmol/L — ABNORMAL LOW (ref 98–111)
Creatinine, Ser: 0.5 mg/dL (ref 0.44–1.00)
GFR, Estimated: >60 mL/min (ref >60)
Glucose, Bld: 94 mg/dL (ref 70–99)
Potassium: 2.7 mmol/L — CL (ref 3.5–5.1)
Sodium: 136 mmol/L (ref 135–145)

## 2019-11-24 LAB — URINALYSIS, ROUTINE W REFLEX MICROSCOPIC
Bilirubin Urine: NEGATIVE
Glucose, UA: NEGATIVE mg/dL
Hgb urine dipstick: NEGATIVE
Ketones, ur: 20 mg/dL — AB
Nitrite: NEGATIVE
Protein, ur: NEGATIVE mg/dL
Specific Gravity, Urine: 1.025 (ref 1.005–1.030)
pH: 7 (ref 5.0–8.0)

## 2019-11-24 LAB — RESPIRATORY PANEL BY RT PCR (FLU A&B, COVID)
Influenza A by PCR: NEGATIVE
Influenza B by PCR: NEGATIVE
SARS Coronavirus 2 by RT PCR: NEGATIVE

## 2019-11-24 LAB — MAGNESIUM: Magnesium: 1.7 mg/dL (ref 1.7–2.4)

## 2019-11-24 MED ORDER — POTASSIUM CHLORIDE 10 MEQ/100ML IV SOLN
10.0000 meq | INTRAVENOUS | Status: AC
  Administered 2019-11-24 (×3): 10 meq via INTRAVENOUS
  Filled 2019-11-24 (×3): qty 100

## 2019-11-24 MED ORDER — SODIUM CHLORIDE 0.9% FLUSH
3.0000 mL | Freq: Two times a day (BID) | INTRAVENOUS | Status: DC
  Administered 2019-11-26 – 2019-12-07 (×3): 3 mL via INTRAVENOUS

## 2019-11-24 MED ORDER — ONDANSETRON HCL 4 MG/2ML IJ SOLN
4.0000 mg | Freq: Once | INTRAMUSCULAR | Status: AC
  Administered 2019-11-24: 4 mg via INTRAVENOUS
  Filled 2019-11-24: qty 2

## 2019-11-24 MED ORDER — PROCHLORPERAZINE EDISYLATE 10 MG/2ML IJ SOLN
10.0000 mg | Freq: Once | INTRAMUSCULAR | Status: AC
  Administered 2019-11-24: 10 mg via INTRAVENOUS
  Filled 2019-11-24: qty 2

## 2019-11-24 MED ORDER — MAGNESIUM SULFATE 4 GM/100ML IV SOLN
4.0000 g | Freq: Once | INTRAVENOUS | Status: AC
  Administered 2019-11-24: 4 g via INTRAVENOUS
  Filled 2019-11-24: qty 100

## 2019-11-24 MED ORDER — FLUTICASONE PROPIONATE 50 MCG/ACT NA SUSP
2.0000 | Freq: Every day | NASAL | Status: DC
  Administered 2019-11-24 – 2019-12-12 (×15): 2 via NASAL
  Filled 2019-11-24: qty 16

## 2019-11-24 MED ORDER — POTASSIUM CHLORIDE IN NACL 40-0.9 MEQ/L-% IV SOLN
INTRAVENOUS | Status: DC
  Filled 2019-11-24 (×2): qty 1000

## 2019-11-24 MED ORDER — BISACODYL 10 MG RE SUPP
10.0000 mg | Freq: Every day | RECTAL | Status: DC
  Filled 2019-11-24: qty 1

## 2019-11-24 MED ORDER — LORAZEPAM 2 MG/ML IJ SOLN
0.5000 mg | Freq: Three times a day (TID) | INTRAMUSCULAR | Status: DC
  Administered 2019-11-24 – 2019-11-26 (×5): 0.5 mg via INTRAVENOUS
  Filled 2019-11-24 (×5): qty 1

## 2019-11-24 MED ORDER — ENOXAPARIN SODIUM 120 MG/0.8ML ~~LOC~~ SOLN
120.0000 mg | SUBCUTANEOUS | Status: DC
  Administered 2019-11-24 – 2019-12-07 (×14): 120 mg via SUBCUTANEOUS
  Filled 2019-11-24 (×15): qty 0.8

## 2019-11-24 MED ORDER — LORAZEPAM 2 MG/ML IJ SOLN
0.5000 mg | Freq: Once | INTRAMUSCULAR | Status: AC
  Administered 2019-11-24: 0.5 mg via INTRAVENOUS
  Filled 2019-11-24: qty 1

## 2019-11-24 MED ORDER — ACETAMINOPHEN 650 MG RE SUPP: 650.0000 mg | Freq: Four times a day (QID) | RECTAL | Status: DC | PRN

## 2019-11-24 MED ORDER — HYDROMORPHONE HCL 1 MG/ML IJ SOLN
0.5000 mg | INTRAMUSCULAR | Status: DC | PRN
  Administered 2019-11-29 – 2019-12-10 (×21): 0.5 mg via INTRAVENOUS
  Filled 2019-11-24 (×21): qty 0.5

## 2019-11-24 MED ORDER — LORAZEPAM 2 MG/ML IJ SOLN
1.0000 mg | Freq: Three times a day (TID) | INTRAMUSCULAR | Status: DC
Start: 2019-11-24 — End: 2019-11-24

## 2019-11-24 MED ORDER — ACETAMINOPHEN 325 MG PO TABS
650.0000 mg | ORAL_TABLET | Freq: Four times a day (QID) | ORAL | Status: DC | PRN
  Administered 2019-12-12: 650 mg via ORAL
  Filled 2019-11-24: qty 2

## 2019-11-24 MED ORDER — DIPHENHYDRAMINE HCL 50 MG/ML IJ SOLN
12.5000 mg | Freq: Once | INTRAMUSCULAR | Status: AC
  Administered 2019-11-24: 12.5 mg via INTRAVENOUS
  Filled 2019-11-24: qty 1

## 2019-11-24 MED ORDER — METOCLOPRAMIDE HCL 5 MG/ML IJ SOLN
10.0000 mg | Freq: Once | INTRAMUSCULAR | Status: AC
  Administered 2019-11-24: 10 mg via INTRAVENOUS
  Filled 2019-11-24: qty 2

## 2019-11-24 MED ORDER — LEVALBUTEROL HCL 0.63 MG/3ML IN NEBU: 0.6300 mg | INHALATION_SOLUTION | Freq: Four times a day (QID) | RESPIRATORY_TRACT | Status: DC | PRN

## 2019-11-24 MED ORDER — SODIUM CHLORIDE (PF) 0.9 % IJ SOLN
INTRAMUSCULAR | Status: AC
  Filled 2019-11-24: qty 50

## 2019-11-24 MED ORDER — TOPIRAMATE 25 MG PO TABS
50.0000 mg | ORAL_TABLET | Freq: Every day | ORAL | Status: DC
  Administered 2019-11-25 – 2019-12-11 (×14): 50 mg via ORAL
  Filled 2019-11-24 (×16): qty 2

## 2019-11-24 MED ORDER — SODIUM CHLORIDE 0.9 % IV SOLN
1.0000 g | INTRAVENOUS | Status: DC
  Administered 2019-11-24 – 2019-11-26 (×3): 1 g via INTRAVENOUS
  Filled 2019-11-24 (×3): qty 1

## 2019-11-24 MED ORDER — SODIUM CHLORIDE 0.9 % IV BOLUS
1000.0000 mL | Freq: Once | INTRAVENOUS | Status: AC
  Administered 2019-11-24: 1000 mL via INTRAVENOUS

## 2019-11-24 MED ORDER — IOHEXOL 300 MG/ML  SOLN
100.0000 mL | Freq: Once | INTRAMUSCULAR | Status: AC | PRN
  Administered 2019-11-24: 100 mL via INTRAVENOUS

## 2019-11-24 MED ORDER — DEXAMETHASONE SODIUM PHOSPHATE 4 MG/ML IJ SOLN
4.0000 mg | INTRAMUSCULAR | Status: AC
  Administered 2019-11-24 – 2019-11-26 (×3): 4 mg via INTRAVENOUS
  Filled 2019-11-24 (×3): qty 1

## 2019-11-24 MED ORDER — SODIUM CHLORIDE 0.9 % IV SOLN: INTRAVENOUS | Status: DC

## 2019-11-24 MED ORDER — HYDROMORPHONE HCL 1 MG/ML IJ SOLN
0.5000 mg | Freq: Once | INTRAMUSCULAR | Status: AC
  Administered 2019-11-24: 0.5 mg via INTRAVENOUS
  Filled 2019-11-24: qty 1

## 2019-11-24 MED ORDER — ONDANSETRON HCL 4 MG/2ML IJ SOLN
4.0000 mg | Freq: Four times a day (QID) | INTRAMUSCULAR | Status: DC | PRN
  Administered 2019-11-26 – 2019-12-05 (×22): 4 mg via INTRAVENOUS
  Filled 2019-11-24 (×22): qty 2

## 2019-11-24 MED ORDER — PANTOPRAZOLE SODIUM 40 MG IV SOLR
40.0000 mg | Freq: Two times a day (BID) | INTRAVENOUS | Status: DC
  Administered 2019-11-24 – 2019-11-27 (×6): 40 mg via INTRAVENOUS
  Filled 2019-11-24 (×6): qty 40

## 2019-11-24 MED ORDER — METOPROLOL TARTRATE 5 MG/5ML IV SOLN
5.0000 mg | Freq: Three times a day (TID) | INTRAVENOUS | Status: DC
  Administered 2019-11-24 – 2019-12-08 (×41): 5 mg via INTRAVENOUS
  Filled 2019-11-24 (×41): qty 5

## 2019-11-24 MED ORDER — HYDRALAZINE HCL 20 MG/ML IJ SOLN: 10.0000 mg | Freq: Four times a day (QID) | INTRAMUSCULAR | Status: DC | PRN

## 2019-11-24 NOTE — ED Provider Notes (Signed)
[facility] DEPT Provider Note   CSN: [id number] Arrival date & time: 11/24/19  1325     History Chief Complaint  Patient presents with  . Emesis    Cynthia Frye is a 59 y.o. female.  HPI She reports nausea and vomiting with decreased stooling for 5 days, during which she was treated with 3-day course of chemotherapy.  She was being treated at home, and yesterday when she went to have the pump removed, she was treated with IV fluids.  She is using both Compazine and Zofran with partial relief of nausea and vomiting.  She denies fever, blood in emesis, cough, shortness of breath or chest pain.  She is being treated for pancreatic cancer.  There are no other known modifying factors.    Past Medical History:  Diagnosis Date  . Allergies    peanuts, corn, beans, red grapefruit, naval oranges  . Asthma    allergy shots and medication  . Cancer [facility])    pancreatic  . Collagen vascular disease ([place])   . DDD (degenerative disc disease), lumbar   . Eustachian tube dysfunction 12/2012   rhinitis, vertigo- Dr. Wilburn Cornelia, ENT   . Fibroids   . Heart murmur    Echo 1/18: EF 55-60, no RWMA, normal diastolic function, trivial AI, PASP 32  . History of cardiac catheterization    LHC 3/18: normal coronary arteries.   . History of nuclear stress test    ETT-Myoview 2/18: EF 62, + ECG response; apical and distal septal ischemia; intermediate risk.  [facility] Kitchen Hypertension   . Iron deficiency anemia   . Lupus [facility]) 2011   Dr. Trudie Reed  . Migraine headache    trial of generic maxalt 10 mg, January 2021  . Obesity   . Seasonal allergies   . Seizure in childhood [facility])    as a child no treatment none x 30 years    Patient Active Problem List   Diagnosis Date Noted  . Genetic testing 11/12/2019  . Colitis 11/04/2019  . Acute deep vein thrombosis of left iliac vein (HCC) 11/04/2019  . Hypokalemia 11/04/2019  . Port-A-Cath in place 10/24/2019  . Goals of care,  counseling/discussion 10/18/2019  . Pancreatic cancer ([place]) 10/02/2019  . Chronic migraine without aura without status migrainosus, not intractable 04/23/2019  . Migraine with aura and without status migrainosus, not intractable 04/23/2019  . Essential hypertension 04/29/2016  . Lupus (systemic lupus erythematosus) ([place]) 04/29/2016  . Asthma 04/29/2016  . Seasonal allergies 04/29/2016  . Abnormal stress test 04/09/2016    Past Surgical History:  Procedure Laterality Date  . ABDOMINAL HYSTERECTOMY    . BACK SURGERY  2009  . COLONOSCOPY WITH PROPOFOL N/A 04/11/2012   Procedure: COLONOSCOPY WITH PROPOFOL;  Surgeon: Garlan Fair, MD;  Location: [facility] ENDOSCOPY;  Service: Endoscopy;  Laterality: N/A;  . HERNIA REPAIR     as child unbilical  . K9-T partial discectomy and laminectomy     Dr. Christella Noa  . LEFT HEART CATH AND CORONARY ANGIOGRAPHY N/A 04/09/2016   Procedure: Left Heart Cath and Coronary Angiography;  Surgeon: Nelva Bush, MD;  Location: [facility] CV LAB;  Service: Cardiovascular;  Laterality: N/A;  . MOUTH SURGERY    . PORTACATH PLACEMENT N/A 10/11/2019   Procedure: INSERTION PORT-A-CATH LEFT SUBCLAVIAN;  Surgeon: [name], MD;  Location: [facility];  Service: General;  Laterality: N/A;     OB History   No obstetric history on file.  Family History  Problem Relation Age of Onset  . Asthma Mother   . Diabetes Mother   . Hypertension Mother   . Heart attack Father 41  . CAD Father   . Stroke Maternal Aunt   . Stroke Paternal Aunt   . Stroke Paternal Uncle   . Gout Other        uncles   . Cancer Niece        paternal half sister's daughter; unknown type; unknown age diagnosed  . Ovarian cancer Neg Hx   . Breast cancer Neg Hx   . Colon cancer Neg Hx   . Migraines Neg Hx     Social History   Tobacco Use  . Smoking status: Never Smoker  . Smokeless tobacco: Never Used  Vaping Use  . Vaping Use: Never used  Substance Use Topics  .  Alcohol use: No  . Drug use: No    Home Medications Prior to Admission medications   Medication Sig Start Date End Date Taking? Authorizing Provider  acetaminophen (TYLENOL) 500 MG tablet Take 1,000 mg by mouth every 6 (six) hours as needed for moderate pain.   Yes [provider]  albuterol (PROVENTIL HFA;VENTOLIN HFA) 108 (90 Base) MCG/ACT inhaler Inhale 2 puffs into the lungs every 6 (six) hours as needed for wheezing or shortness of breath. 04/29/16  Yes Jeffery, Domingo Mend, PA  ALPRAZolam (XANAX) 0.25 MG tablet Take 1 tablet (0.25 mg total) by mouth at bedtime as needed for anxiety. 10/18/19  Yes Truitt Merle, MD  Bepotastine Besilate (BEPREVE) 1.5 % SOLN Place 1 drop into both eyes 2 (two) times daily as needed (dry eyes).    Yes [provider]  Cholecalciferol (VITAMIN D) 2000 units CAPS Take 2,000 Units by mouth daily.    Yes [provider]  dexamethasone (DECADRON) 4 MG tablet Take 1 tablet (4 mg total) by mouth daily. Take 1 tablet once daily for 3 to 5 days after chemo 10/29/19  Yes Alla Feeling, NP  diclofenac (VOLTAREN) 75 MG EC tablet Take 75 mg by mouth 2 (two) times daily. 10/17/19  Yes [provider]  Dietary Management Product (RHEUMATE) CAPS Take 1 capsule by mouth daily.    Yes [provider]  diphenoxylate-atropine (LOMOTIL) 2.5-0.025 MG tablet Take 2 tablets by mouth 4 (four) times daily as needed for diarrhea or loose stools (use 2nd, if Imodium doesn't work. Max dose: 8 tablets/day.). Patient taking differently: Take 2 tablets by mouth 4 (four) times daily as needed for diarrhea or loose stools.  11/09/19  Yes Orson Slick, MD  DIPROLENE 0.05 % ointment Apply 1 application topically daily as needed (skin irritation).  04/08/15  Yes [provider]  dronabinol (MARINOL) 2.5 MG capsule Take 1 capsule (2.5 mg total) by mouth 2 (two) times daily before a meal. Patient taking differently: Take 2.5 mg by mouth 2 (two) times  daily as needed (nausea).  11/14/19  Yes Truitt Merle, MD  enoxaparin (LOVENOX) 120 MG/0.8ML injection Inject 0.8 mLs (120 mg total) into the skin daily. 11/10/19  Yes Orson Slick, MD  esomeprazole (NEXIUM) 10 MG packet Take 10 mg by mouth daily as needed (heartburn).    Yes [provider]  feeding supplement (ENSURE ENLIVE / ENSURE PLUS) LIQD Take 237 mLs by mouth 2 (two) times daily between meals. 11/09/19  Yes Hongalgi, Lenis Dickinson, MD  ferrous sulfate 325 (65 FE) MG tablet Take 325 mg by mouth daily with breakfast.  Yes [provider]  fluticasone (FLONASE) 50 MCG/ACT nasal spray Place 2 sprays into both nostrils daily. 04/29/16  Yes Jeffery, Domingo Mend, PA  hydroxychloroquine (PLAQUENIL) 200 MG tablet Take 400 mg by mouth daily.  06/17/14  Yes [provider]  lidocaine-prilocaine (EMLA) cream Apply to affected area once Patient taking differently: Apply 1 application topically daily as needed (for port access).  10/18/19  Yes Truitt Merle, MD  loperamide (IMODIUM) 2 MG capsule Take 2 capsules (4 mg total) by mouth 2 (two) times daily as needed for diarrhea or loose stools (Use 1st). 11/09/19  Yes Hongalgi, Lenis Dickinson, MD  loratadine (CLARITIN) 10 MG tablet Take 10 mg by mouth daily.   Yes [provider]  metoprolol succinate (TOPROL-XL) 50 MG 24 hr tablet Take 50 mg by mouth daily.    Yes [provider]  ondansetron (ZOFRAN) 8 MG tablet Take 1 tablet (8 mg total) by mouth 2 (two) times daily as needed. Start on day 3 after chemotherapy. Patient taking differently: Take 8 mg by mouth 2 (two) times daily as needed for nausea or vomiting. Start on day 3 after chemotherapy. 10/18/19  Yes Truitt Merle, MD  oxyCODONE (OXY IR/ROXICODONE) 5 MG immediate release tablet Take 1 tablet (5 mg total) by mouth every 6 (six) hours as needed for severe pain. 11/16/19  Yes Truitt Merle, MD  pantoprazole (PROTONIX) 40 MG tablet Take 40 mg by mouth daily.   Yes [provider]    potassium chloride 20 MEQ/15ML (10%) SOLN Take 15 mLs (20 mEq total) by mouth 2 (two) times daily. 11/21/19  Yes Alla Feeling, NP  prochlorperazine (COMPAZINE) 10 MG tablet Take 1 tablet (10 mg total) by mouth every 6 (six) hours as needed (Nausea or vomiting). Patient taking differently: Take 10 mg by mouth every 6 (six) hours as needed for nausea or vomiting.  10/18/19  Yes Truitt Merle, MD  topiramate (TOPAMAX) 50 MG tablet Take 1 tablet (50 mg total) by mouth at bedtime. 11/09/19  Yes Hongalgi, Lenis Dickinson, MD  KLOR-CON M20 20 MEQ tablet TAKE 1 TABLET BY MOUTH TWICE A DAY Patient not taking: Reported on 11/24/2019 11/20/19   Truitt Merle, MD  LORazepam (ATIVAN) 0.5 MG tablet Take 1 tablet (0.5 mg total) by mouth every 8 (eight) hours as needed for anxiety (and anticiaptory n/v. DO NOT TAKE WITH XANAX). 11/21/19   Alla Feeling, NP  rizatriptan (MAXALT-MLT) 10 MG disintegrating tablet Take 1 tablet (10 mg total) by mouth as needed for migraine. May repeat in 2 hours if needed Patient not taking: Reported on 11/24/2019 04/19/19   Melvenia Beam, MD    Allergies    Cinnamon, Peanut-containing drug products, Prednisone, Corn oil, Corn-containing products, and Other  Review of Systems   Review of Systems  All other systems reviewed and are negative.   Physical Exam Updated Vital Signs BP (!) 156/85   Pulse 80   Temp 98 F (36.7 C) (Oral)   Resp 16   Ht 5\' 7"  (1.702 m)   Wt 79.4 kg   SpO2 100%   BMI 27.41 kg/m   Physical Exam Vitals and nursing note reviewed.  Constitutional:      General: She is not in acute distress.    Appearance: She is well-developed. She is not ill-appearing, toxic-appearing or diaphoretic.  HENT:     Head: Normocephalic and atraumatic.     Right Ear: External ear normal.     Left Ear: External ear normal.  Eyes:     Conjunctiva/sclera: Conjunctivae normal.     Pupils: Pupils are equal, round, and reactive to light.  Neck:     Trachea: Phonation normal.   Cardiovascular:     Rate and Rhythm: Normal rate and regular rhythm.     Heart sounds: Normal heart sounds.  Pulmonary:     Effort: Pulmonary effort is normal.     Breath sounds: Normal breath sounds.  Abdominal:     General: There is distension.     Palpations: Abdomen is soft. There is no mass.     Tenderness: There is abdominal tenderness (Upper, moderate).     Comments: Hyperactive bowel sounds  Musculoskeletal:        General: Normal range of motion.     Cervical back: Normal range of motion and neck supple.  Skin:    General: Skin is warm and dry.  Neurological:     Mental Status: She is alert and oriented to person, place, and time.     Cranial Nerves: No cranial nerve deficit.     Sensory: No sensory deficit.     Motor: No abnormal muscle tone.     Coordination: Coordination normal.  Psychiatric:        Behavior: Behavior normal.        Judgment: Judgment normal.     ED Results / Procedures / Treatments   Labs (all labs ordered are listed, but only abnormal results are displayed) Labs Reviewed  BASIC METABOLIC PANEL - Abnormal; Notable for the following components:      Result Value   Potassium 2.7 (*)    Chloride 97 (*)    All other components within normal limits  CBC WITH DIFFERENTIAL/PLATELET - Abnormal; Notable for the following components:   RBC 2.75 (*)    Hemoglobin 8.2 (*)    HCT 25.0 (*)    All other components within normal limits  RESPIRATORY PANEL BY RT PCR (FLU A&B, COVID)  MAGNESIUM    EKG EKG Interpretation  Date/Time:  Saturday November 24 2019 13:48:01 EDT Ventricular Rate:  92 PR Interval:    QRS Duration: 84 QT Interval:  418 QTC Calculation: 518 R Axis:   22 Text Interpretation: Sinus rhythm Nonspecific T abnormalities, diffuse leads Prolonged QT interval Since last tracing rate slower and QT longer Otherwise no significant change Confirmed by Daleen Bo [phone]) on 11/24/2019 2:52:27 PM   Radiology CT Abdomen Pelvis W  Contrast  Result Date: 11/24/2019 CLINICAL DATA:  Abdominal distension, pain, nausea, vomiting. Pancreatic cancer EXAM: CT ABDOMEN AND PELVIS WITH CONTRAST TECHNIQUE: Multidetector CT imaging of the abdomen and pelvis was performed using the standard protocol following bolus administration of intravenous contrast. CONTRAST:  118mL OMNIPAQUE IOHEXOL 300 MG/ML  SOLN COMPARISON:  11/04/2019 FINDINGS: Lower chest: Scarring at the left lung base.  No acute abnormality. Hepatobiliary: Numerous lesions throughout the liver, enlarging since prior study. Index right hepatic dome lesion measures 2.1 cm compared to 1.6 cm previously. Inferior right hepatic lesion measures 1.4 cm compared with 9 mm previously. Gallbladder unremarkable. Pancreas: Pancreatic head mass measuring up to 3.4 cm compared to 3.2 cm previously, likely not significantly changed. Pancreatic ductal dilatation in the body and tail. Spleen: No focal abnormality.  Normal size. Adrenals/Urinary Tract: No adrenal abnormality. No focal renal abnormality. No stones or hydronephrosis. Urinary bladder is unremarkable. Stomach/Bowel: Stomach, large and small bowel grossly unremarkable. Normal appendix. Vascular/Lymphatic: No evidence of aneurysm or adenopathy. Reproductive: No mass or abnormality noted. Other: No free  fluid or free air. Musculoskeletal: No acute bony abnormality. IMPRESSION: Pancreatic head mass again noted compatible with patient's given history of pancreatic cancer. Numerous metastases throughout the liver, enlarging since prior study. Electronically Signed   By: Rolm Baptise M.D.   On: 11/24/2019 17:02    Procedures .Critical Care Performed by: Daleen Bo, MD Authorized by: Daleen Bo, MD   Critical care provider statement:    Critical care time (minutes):  35   Critical care start time:  11/24/2019 1:30 PM   Critical care end time:  11/24/2019 3:30 PM   Critical care time was exclusive of:  Separately billable procedures  and treating other patients   Critical care was necessary to treat or prevent imminent or life-threatening deterioration of the following conditions:  Metabolic crisis   Critical care was time spent personally by me on the following activities:  Blood draw for specimens, development of treatment plan with patient or surrogate, discussions with consultants, evaluation of patient's response to treatment, examination of patient, obtaining history from patient or surrogate, ordering and performing treatments and interventions, ordering and review of laboratory studies, pulse oximetry, re-evaluation of patient's condition, review of old charts and ordering and review of radiographic studies   (including critical care time)  Medications Ordered in ED Medications  potassium chloride 10 mEq in 100 mL IVPB (10 mEq Intravenous New Bag/Given 11/24/19 1629)  sodium chloride (PF) 0.9 % injection (has no administration in time range)  sodium chloride 0.9 % bolus 1,000 mL (1,000 mLs Intravenous New Bag/Given 11/24/19 1426)  ondansetron (ZOFRAN) injection 4 mg (4 mg Intravenous Given 11/24/19 1423)  LORazepam (ATIVAN) injection 0.5 mg (0.5 mg Intravenous Given 11/24/19 1422)  iohexol (OMNIPAQUE) 300 MG/ML solution 100 mL (100 mLs Intravenous Contrast Given 11/24/19 1641)    ED Course  I have reviewed the triage vital signs and the nursing notes.  Pertinent labs & imaging results that were available during my care of the patient were reviewed by me and considered in my medical decision making (see chart for details).  Clinical Course as of Nov 24 1706  Sat Nov 24, 2019  1512 Hypokalemia present, concerning for metabolic instability, IV potassium ordered.  Will check magnesium level for coexisting electrolyte abnormality.   [EW]    Clinical Course User Index [EW] Daleen Bo, MD   MDM Rules/Calculators/A&P                           Patient Vitals for the past 24 hrs:  BP Temp Temp src Pulse Resp  SpO2 Height Weight  11/24/19 1630 (!) 156/85 -- -- 80 16 100 % -- --  11/24/19 1615 (!) 165/84 -- -- 78 15 100 % -- --  11/24/19 1600 (!) 146/80 -- -- 81 15 100 % -- --  11/24/19 1545 (!) 152/81 -- -- 81 18 100 % -- --  11/24/19 1530 (!) 146/77 -- -- 80 16 100 % -- --  11/24/19 1515 (!) 141/82 -- -- 79 (!) 24 100 % -- --  11/24/19 1500 119/83 -- -- 81 17 100 % -- --  11/24/19 1445 (!) 150/80 -- -- 85 15 100 % -- --  11/24/19 1430 (!) 148/77 -- -- 82 18 100 % -- --  11/24/19 1415 (!) 143/84 -- -- 95 18 100 % -- --  11/24/19 1400 (!) 154/84 -- -- 91 12 100 % -- --  11/24/19 1348 (!) 149/84 98 F (36.7 C) Oral 95  13 100 % 5\' 7"  (1.702 m) 79.4 kg  11/24/19 1345 (!) 149/84 -- -- 95 -- 100 % -- --  11/24/19 1332 -- -- -- -- -- -- 5\' 7"  (1.702 m) 79.4 kg  11/24/19 1331 (!) 144/87 98.4 F (36.9 C) -- (!) 105 17 100 % -- --      Medical Decision Making:  This patient is presenting for evaluation of nausea, vomiting and decreased stooling, which does require a range of treatment options, and is a complaint that involves a high risk of morbidity and mortality. The differential diagnoses include gastroenteritis, chemotherapy complication, cancer complication. I decided to review old records, and in summary middle-aged female being treated for pancreatic cancer with GI distress, nonspecific nature, requiring comprehensive evaluation.  I obtained additional historical information from husband, at the bedside.  Clinical Laboratory Tests Ordered, included CBC and Metabolic panel. Review indicates hypokalemia, anemia. Radiologic Tests Ordered, included CT abdomen pelvis.  I independently Visualized: Radiographic images, which show metastatic pancreatic cancer no other acute abnormalities    Critical Interventions-clinical evaluation, laboratory testing, CT imaging, IV potassium, IV fluids, observation and reassessment  After These Interventions, the Patient was reevaluated and was found to require  admission for further care and treatment.  CRITICAL CARE-yes Performed by: Daleen Bo  Nursing Notes Reviewed/ Care Coordinated Applicable Imaging Reviewed Interpretation of Laboratory Data incorporated into ED treatment  Dr. Benjie Karvonen to arrange admission for further care and treatment.    Final Clinical Impression(s) / ED Diagnoses Final diagnoses:  Nausea and vomiting, intractability of vomiting not specified, unspecified vomiting type  Hypokalemia  Generalized abdominal pain    Rx / DC Orders ED Discharge Orders    None       Daleen Bo, MD 11/24/19 1709

## 2019-11-24 NOTE — ED Provider Notes (Signed)
  Physical Exam  BP (!) 159/87   Pulse 80   Temp 98 F (36.7 C) (Oral)   Resp 17   Ht 5\' 7"  (1.702 m)   Wt 79.4 kg   SpO2 100%   BMI 27.41 kg/m   Physical Exam  ED Course/Procedures   Clinical Course as of Nov 24 1735  Sat Nov 24, 2019  1512 Hypokalemia present, concerning for metabolic instability, IV potassium ordered.  Will check magnesium level for coexisting electrolyte abnormality.   [EW]    Clinical Course User Index [EW] Daleen Bo, MD    Procedures  MDM  Care assumed at 3 PM.  Patient has a history of metastatic pancreatic cancer on palliative chemo.  Patient is here with nausea vomiting for several days.  Patient went to the oncology center yesterday and received IV fluids but still not feeling better.  Patient's potassium is 2.7 and signout pending CT abdomen pelvis and likely admission.  5:38 PM CT confirmed metastatic pancreatic cancer but no obstruction.  Receiving multiple potassium runs and still vomiting despite getting nausea medicines.  Patient will be admitted for observation for hypokalemia     Drenda Freeze, MD 11/24/19 [phone]

## 2019-11-24 NOTE — ED Notes (Signed)
Date and time results received: 11/24/19 3:01 PM  Test: Potassium Critical Value: 2.7  Name of Provider Notified: Eulis Foster

## 2019-11-24 NOTE — H&P (Signed)
History and Physical    Cynthia Frye WER:[id number] DOB: 1960/10/28 DOA: 11/24/2019  PCP: Josetta Huddle, MD  Patient coming from: Home  I have personally briefly reviewed patient's old medical records in [place]  Chief Complaint: Intractable nausea and vomiting  HPI: Cynthia Frye is an unfortunate 59 y.o. female with medical history significant of metastatic pancreatic adenocarcinoma in the head with mets to the liver diagnosed 09/19/2019 per CT abdomen and pelvis with EUS biopsy per Dr. Paulita Fujita 09/25/2019 showing pancreatic adenocarcinoma, currently on chemotherapy status post cycle 1 FOLFIRINOX on 10/24/2019 and currently undergoing cycle 2 which started on 11/21/2019 per patient, history of asthma, lupus, migraine headaches who presents to the ED with a 1 day history of worsening nausea and vomiting with multiple episodes of emesis on the day of admission to the point where she was unable to take anything down including her medications.  Patient denies any fevers, no chills, no shortness of breath, no diarrhea, no syncope, no lightheadedness, no melena, no hematemesis, no hematochezia, no focal neurological deficits, no dysuria.  Patient does endorse decreased oral intake.  Patient does endorse epigastric abdominal pain, constipation.  Patient also endorses some chest discomfort from multiple episodes of emesis.  Patient states with poor oral intake eating less than 25% of her meals.  Patient tried Compazine and Zofran at home without any significant improvement and subsequently presented to the ED.  Patient states similar episode happened after her first treatment of chemotherapy when she developed nausea vomiting diarrhea and was hospitalized from 11/04/2019 - 11/09/2019 for colitis.  ED Course: Patient seen in the ED, urinalysis pending.  Basic metabolic profile with a potassium of 2.7, magnesium of 1.7 otherwise within normal limits.  CBC with a hemoglobin of 8.2 otherwise was within  normal limits.  Urinalysis pending.  Chest x-ray not done.  CT abdomen and pelvis which was done with pancreatic head mass again noted compatible with patient's history of pancreatic cancer, numerous metastases throughout the liver, enlarging since prior study.  Patient placed on IV fluids.  3 rounds of KCl ordered.  Patient given Ativan, IV Zofran with no significant improvement with nausea and emesis.  Hospitalist were called to admit the patient for further evaluation and management.  Review of Systems: As per HPI otherwise all other systems reviewed and are negative.  Past Medical History:  Diagnosis Date  . Allergies    peanuts, corn, beans, red grapefruit, naval oranges  . Asthma    allergy shots and medication  . Cancer [facility])    pancreatic  . Collagen vascular disease ([place])   . DDD (degenerative disc disease), lumbar   . Eustachian tube dysfunction 12/2012   rhinitis, vertigo- Dr. Wilburn Cornelia, ENT   . Fibroids   . Heart murmur    Echo 1/18: EF 55-60, no RWMA, normal diastolic function, trivial AI, PASP 32  . History of cardiac catheterization    LHC 3/18: normal coronary arteries.   . History of nuclear stress test    ETT-Myoview 2/18: EF 62, + ECG response; apical and distal septal ischemia; intermediate risk.  Marland Kitchen Hypertension   . Iron deficiency anemia   . Lupus [facility]) 2011   Dr. Trudie Reed  . Migraine headache    trial of generic maxalt 10 mg, January 2021  . Obesity   . Seasonal allergies   . Seizure in childhood [facility])    as a child no treatment none x 30 years    Past Surgical History:  Procedure  Laterality Date  . ABDOMINAL HYSTERECTOMY    . BACK SURGERY  2009  . COLONOSCOPY WITH PROPOFOL N/A 04/11/2012   Procedure: COLONOSCOPY WITH PROPOFOL;  Surgeon: Garlan Fair, MD;  Location: [facility];  Service: Endoscopy;  Laterality: N/A;  . HERNIA REPAIR     as child unbilical  . A1-P partial discectomy and laminectomy     Dr. Christella Noa  . LEFT HEART CATH AND CORONARY  ANGIOGRAPHY N/A 04/09/2016   Procedure: Left Heart Cath and Coronary Angiography;  Surgeon: Nelva Bush, MD;  Location: [place] CV LAB;  Service: Cardiovascular;  Laterality: N/A;  . MOUTH SURGERY    . PORTACATH PLACEMENT N/A 10/11/2019   Procedure: INSERTION PORT-A-CATH LEFT SUBCLAVIAN;  Surgeon: [name], MD;  Location: [place];  Service: General;  Laterality: N/A;    Social History  reports that she has never smoked. She has never used smokeless tobacco. She reports that she does not drink alcohol and does not use drugs.  Allergies  Allergen Reactions  . Cinnamon Other (See Comments)    Other reaction(s): Other (See Comments) Unknown  On allergy test Unknown  On allergy test  . Peanut-Containing Drug Products Hives  . Prednisone Other (See Comments)    Interacts with another medicine she is taking  . Corn Oil Hives  . Corn-Containing Products Hives  . Other Rash    Red grapefruit and naval oranges- lips tingling and facial rash Potatoes, tomatoes, garlic, oregano/basil caused headache    Family History  Problem Relation Age of Onset  . Asthma Mother   . Diabetes Mother   . Hypertension Mother   . Heart attack Father 54  . CAD Father   . Stroke Maternal Aunt   . Stroke Paternal Aunt   . Stroke Paternal Uncle   . Gout Other        uncles   . Cancer Niece        paternal half sister's daughter; unknown type; unknown age diagnosed  . Ovarian cancer Neg Hx   . Breast cancer Neg Hx   . Colon cancer Neg Hx   . Migraines Neg Hx    Mother deceased age 19 from acute MI per patient.  Father deceased age 69 from asthma attack per patient.  Prior to Admission medications   Medication Sig Start Date End Date Taking? Authorizing Provider  acetaminophen (TYLENOL) 500 MG tablet Take 1,000 mg by mouth every 6 (six) hours as needed for moderate pain.   Yes [provider]  albuterol (PROVENTIL HFA;VENTOLIN HFA) 108 (90 Base) MCG/ACT inhaler  Inhale 2 puffs into the lungs every 6 (six) hours as needed for wheezing or shortness of breath. 04/29/16  Yes Jeffery, Domingo Mend, PA  ALPRAZolam (XANAX) 0.25 MG tablet Take 1 tablet (0.25 mg total) by mouth at bedtime as needed for anxiety. 10/18/19  Yes Truitt Merle, MD  Bepotastine Besilate (BEPREVE) 1.5 % SOLN Place 1 drop into both eyes 2 (two) times daily as needed (dry eyes).    Yes [provider]  Cholecalciferol (VITAMIN D) 2000 units CAPS Take 2,000 Units by mouth daily.    Yes [provider]  dexamethasone (DECADRON) 4 MG tablet Take 1 tablet (4 mg total) by mouth daily. Take 1 tablet once daily for 3 to 5 days after chemo 10/29/19  Yes Alla Feeling, NP  diclofenac (VOLTAREN) 75 MG EC tablet Take 75 mg by mouth 2 (two) times daily. 10/17/19  Yes [provider]  Dietary  Management Product (RHEUMATE) CAPS Take 1 capsule by mouth daily.    Yes [provider]  diphenoxylate-atropine (LOMOTIL) 2.5-0.025 MG tablet Take 2 tablets by mouth 4 (four) times daily as needed for diarrhea or loose stools (use 2nd, if Imodium doesn't work. Max dose: 8 tablets/day.). Patient taking differently: Take 2 tablets by mouth 4 (four) times daily as needed for diarrhea or loose stools.  11/09/19  Yes Orson Slick, MD  DIPROLENE 0.05 % ointment Apply 1 application topically daily as needed (skin irritation).  04/08/15  Yes [provider]  dronabinol (MARINOL) 2.5 MG capsule Take 1 capsule (2.5 mg total) by mouth 2 (two) times daily before a meal. Patient taking differently: Take 2.5 mg by mouth 2 (two) times daily as needed (nausea).  11/14/19  Yes Truitt Merle, MD  enoxaparin (LOVENOX) 120 MG/0.8ML injection Inject 0.8 mLs (120 mg total) into the skin daily. 11/10/19  Yes Orson Slick, MD  esomeprazole (NEXIUM) 10 MG packet Take 10 mg by mouth daily as needed (heartburn).    Yes [provider]  feeding supplement (ENSURE ENLIVE / ENSURE PLUS) LIQD Take 237  mLs by mouth 2 (two) times daily between meals. 11/09/19  Yes Hongalgi, Lenis Dickinson, MD  ferrous sulfate 325 (65 FE) MG tablet Take 325 mg by mouth daily with breakfast.   Yes [provider]  fluticasone (FLONASE) 50 MCG/ACT nasal spray Place 2 sprays into both nostrils daily. 04/29/16  Yes Jeffery, Domingo Mend, PA  hydroxychloroquine (PLAQUENIL) 200 MG tablet Take 400 mg by mouth daily.  06/17/14  Yes [provider]  lidocaine-prilocaine (EMLA) cream Apply to affected area once Patient taking differently: Apply 1 application topically daily as needed (for port access).  10/18/19  Yes Truitt Merle, MD  loperamide (IMODIUM) 2 MG capsule Take 2 capsules (4 mg total) by mouth 2 (two) times daily as needed for diarrhea or loose stools (Use 1st). 11/09/19  Yes Hongalgi, Lenis Dickinson, MD  loratadine (CLARITIN) 10 MG tablet Take 10 mg by mouth daily.   Yes [provider]  metoprolol succinate (TOPROL-XL) 50 MG 24 hr tablet Take 50 mg by mouth daily.    Yes [provider]  ondansetron (ZOFRAN) 8 MG tablet Take 1 tablet (8 mg total) by mouth 2 (two) times daily as needed. Start on day 3 after chemotherapy. Patient taking differently: Take 8 mg by mouth 2 (two) times daily as needed for nausea or vomiting. Start on day 3 after chemotherapy. 10/18/19  Yes Truitt Merle, MD  oxyCODONE (OXY IR/ROXICODONE) 5 MG immediate release tablet Take 1 tablet (5 mg total) by mouth every 6 (six) hours as needed for severe pain. 11/16/19  Yes Truitt Merle, MD  pantoprazole (PROTONIX) 40 MG tablet Take 40 mg by mouth daily.   Yes [provider]  potassium chloride 20 MEQ/15ML (10%) SOLN Take 15 mLs (20 mEq total) by mouth 2 (two) times daily. 11/21/19  Yes Alla Feeling, NP  prochlorperazine (COMPAZINE) 10 MG tablet Take 1 tablet (10 mg total) by mouth every 6 (six) hours as needed (Nausea or vomiting). Patient taking differently: Take 10 mg by mouth every 6 (six) hours as needed for nausea or vomiting.   10/18/19  Yes Truitt Merle, MD  topiramate (TOPAMAX) 50 MG tablet Take 1 tablet (50 mg total) by mouth at bedtime. 11/09/19  Yes Hongalgi, Lenis Dickinson, MD  KLOR-CON M20 20 MEQ tablet TAKE 1 TABLET BY MOUTH TWICE A DAY Patient not taking:  Reported on 11/24/2019 11/20/19   Truitt Merle, MD  LORazepam (ATIVAN) 0.5 MG tablet Take 1 tablet (0.5 mg total) by mouth every 8 (eight) hours as needed for anxiety (and anticiaptory n/v. DO NOT TAKE WITH XANAX). 11/21/19   Alla Feeling, NP  rizatriptan (MAXALT-MLT) 10 MG disintegrating tablet Take 1 tablet (10 mg total) by mouth as needed for migraine. May repeat in 2 hours if needed Patient not taking: Reported on 11/24/2019 04/19/19   Melvenia Beam, MD    Physical Exam: Vitals:   11/24/19 1715 11/24/19 1730 11/24/19 1745 11/24/19 1800  BP: (!) 159/87 (!) 167/93 (!) 161/90 124/88  Pulse: 80 (!) 106 85 99  Resp: 17 18 17 16   Temp:      TempSrc:      SpO2: 100% 100% 100% 100%  Weight:      Height:        Constitutional: NAD, calm, comfortable Vitals:   11/24/19 1715 11/24/19 1730 11/24/19 1745 11/24/19 1800  BP: (!) 159/87 (!) 167/93 (!) 161/90 124/88  Pulse: 80 (!) 106 85 99  Resp: 17 18 17 16   Temp:      TempSrc:      SpO2: 100% 100% 100% 100%  Weight:      Height:       Eyes: PERRL, lids and conjunctivae normal ENMT: Mucous membranes are dry. Posterior pharynx clear of any exudate or lesions.Normal dentition.  Neck: normal, supple, no masses, no thyromegaly Respiratory: clear to auscultation bilaterally, no wheezing, no crackles. Normal respiratory effort. No accessory muscle use.  Cardiovascular: Tachycardia.  No lower extremity edema.  No JVD.  No murmurs rubs or gallops. Abdomen: Soft, nondistended, positive bowel sounds, tender to palpation in the epigastrium and right upper quadrant.  No rebound.  No guarding.  Musculoskeletal: no clubbing / cyanosis. No joint deformity upper and lower extremities. Good ROM, no contractures. Normal muscle  tone.  Skin: no rashes, lesions, ulcers. No induration Neurologic: CN 2-12 grossly intact. Sensation intact, DTR normal. Strength 5/5 in all 4.  Psychiatric: Normal judgment and insight. Alert and oriented x 3. Normal mood.   Labs on Admission: I have personally reviewed following labs and imaging studies  CBC: Recent Labs  Lab 11/21/19 0934 11/24/19 1427  WBC 4.6 5.6  NEUTROABS 3.0 4.7  HGB 8.8* 8.2*  HCT 26.4* 25.0*  MCV 89.2 90.9  PLT 253 397    Basic Metabolic Panel: Recent Labs  Lab 11/21/19 0934 11/24/19 1427 11/24/19 1504  NA 139 136  --   K 3.2* 2.7*  --   CL 101 97*  --   CO2 27 27  --   GLUCOSE 87 94  --   BUN 7 8  --   CREATININE 0.65 0.50  --   CALCIUM 9.7 9.1  --   MG  --   --  1.7    GFR: Estimated Creatinine Clearance: 83.1 mL/min (by C-G formula based on SCr of 0.5 mg/dL).  Liver Function Tests: Recent Labs  Lab 11/21/19 0934  AST 41  ALT 34  ALKPHOS 124  BILITOT 0.6  PROT 7.1  ALBUMIN 3.5    Urine analysis:    Component Value Date/Time   COLORURINE YELLOW 11/24/2019 1832   APPEARANCEUR CLEAR 11/24/2019 1832   LABSPEC 1.025 11/24/2019 1832   PHURINE 7.0 11/24/2019 1832   GLUCOSEU NEGATIVE 11/24/2019 [place] NEGATIVE 11/24/2019 [place] NEGATIVE 11/24/2019 1832   KETONESUR 20 (A) 11/24/2019 1832  PROTEINUR NEGATIVE 11/24/2019 1832   NITRITE NEGATIVE 11/24/2019 1832   LEUKOCYTESUR MODERATE (A) 11/24/2019 1832    Radiological Exams on Admission: CT Abdomen Pelvis W Contrast  Result Date: 11/24/2019 CLINICAL DATA:  Abdominal distension, pain, nausea, vomiting. Pancreatic cancer EXAM: CT ABDOMEN AND PELVIS WITH CONTRAST TECHNIQUE: Multidetector CT imaging of the abdomen and pelvis was performed using the standard protocol following bolus administration of intravenous contrast. CONTRAST:  148mL OMNIPAQUE IOHEXOL 300 MG/ML  SOLN COMPARISON:  11/04/2019 FINDINGS: Lower chest: Scarring at the left lung base.  No acute  abnormality. Hepatobiliary: Numerous lesions throughout the liver, enlarging since prior study. Index right hepatic dome lesion measures 2.1 cm compared to 1.6 cm previously. Inferior right hepatic lesion measures 1.4 cm compared with 9 mm previously. Gallbladder unremarkable. Pancreas: Pancreatic head mass measuring up to 3.4 cm compared to 3.2 cm previously, likely not significantly changed. Pancreatic ductal dilatation in the body and tail. Spleen: No focal abnormality.  Normal size. Adrenals/Urinary Tract: No adrenal abnormality. No focal renal abnormality. No stones or hydronephrosis. Urinary bladder is unremarkable. Stomach/Bowel: Stomach, large and small bowel grossly unremarkable. Normal appendix. Vascular/Lymphatic: No evidence of aneurysm or adenopathy. Reproductive: No mass or abnormality noted. Other: No free fluid or free air. Musculoskeletal: No acute bony abnormality. IMPRESSION: Pancreatic head mass again noted compatible with patient's given history of pancreatic cancer. Numerous metastases throughout the liver, enlarging since prior study. Electronically Signed   By: Rolm Baptise M.D.   On: 11/24/2019 17:02    EKG: Independently reviewed.  Sinus rhythm, nonspecific ST-T wave changes, prolonged QT interval.  Assessment/Plan Principal Problem:   Hypokalemia Active Problems:   Essential hypertension   Lupus (systemic lupus erythematosus) (HCC)   Seasonal allergies   Chronic migraine without aura without status migrainosus, not intractable   Pancreatic cancer (HCC)   Acute deep vein thrombosis of left iliac vein (HCC)   Nausea and vomiting in adult   Dehydration   Prolonged QT interval   1  Hypokalemia Secondary to GI losses from nausea and emesis postchemotherapy.  Magnesium level at 1.7.  Patient with ongoing nausea and vomiting and inability to keep anything down.  Patient receiving third round of KCl 10 mEq in the ED.  Will place potassium and IV fluids.  We will give another 3  rounds of KCl.  Magnesium sulfate 4 g IV x1.  Keep magnesium greater than 2.  IV fluids.  Supportive care.  2.  Nausea and vomiting Likely chemotherapy induced.  Patient with recent chemotherapy 11/21/2019.  Patient noted to have had similar episodes recently after first cycle of chemotherapy with nausea vomiting and diarrhea and had to be hospitalized from 11/04/2019-11/09/2019 in addition to colitis.  We will keep patient n.p.o. except ice chips.  Patient with QT prolongation and as such we will place on scheduled IV Ativan 0.5 mg every 8 hours.  IV Zofran as needed.  Repeat EKG in the morning.  Check a chest x-ray to rule out aspiration.  IV fluids.  Supportive care.  3.  Dehydration IV fluids.  4.  Tachycardia Likely secondary to volume depletion, nausea vomiting, possible beta-blockade withdrawal as patient unable to keep medications down.  Placed on IV Lopressor.  IV fluids.  Pain management.  Antiemetics.  Supportive care.  5.  Prolonged QT interval Keep magnesium greater than 2.  Keep potassium greater than 4.  Repeat EKG in the morning.  Avoid QTC prolongation medications.  Supportive care.  Follow.  6.  Pancreatic adenocarcinoma in  the head, with multiple liver lesions likely liver metastases Recently diagnosed per CT scan on 09/19/2019, EUS biopsy per Dr. Paulita Fujita 09/25/2019 consistent with pancreatic adenocarcinoma.  Patient status post cycle 1 FOLFIRINOX 10/24/2019.  Patient stated started cycle 2 of chemotherapy 11/21/2019.  Patient noted per med rec to be on Decadron 4 mg daily after chemotherapy for 3 to 5 days and as such due to nausea and vomiting will place on Decadron 4 mg IV daily x3 more days.  Oncology, Dr. Burr Medico informed via epic of patient's admission.  Pain management.  Supportive care.  7.  Recent left leg vein DVT Continue home regimen full dose Lovenox.  8.  Upper abdominal cramps Likely secondary to metastatic pancreatic adenocarcinoma.  Patient noted to be on oral  oxycodone at home for her lupus prior to admission however due to ongoing nausea and vomiting will place on IV Dilaudid as needed until patient able to tolerate oral intake.  Placed on PPI IV every 12 hours for now.  Supportive care.  Follow.  56.  Lupus Being followed in the outpatient setting by rheumatologist, Dr. Trudie Reed on weekly methotrexate injections, Plaquenil and diclofenac.  Outpatient follow-up with rheumatology.  10.  Probable UTI Urinalysis with moderate leukocytes, nitrite negative, rare bacteria, 21-50 WBCs.  Check urine cultures.  Patient with metastatic pancreatic cancer on chemotherapy and as such we will place empirically on IV Rocephin pending urine culture results.  11.  Anxiety Patient currently being placed on scheduled Ativan for nausea and vomiting due to QT prolongation.  Supportive care.  Follow.  12.  Hypertension IV Lopressor.  DVT prophylaxis: Lovenox Code Status:   Full Family Communication:  Updated patient and husband at bedside. Disposition Plan:   Patient is from:  Home  Anticipated DC to:  Home  Anticipated DC date:  2 to 3 days  Anticipated DC barriers: Ongoing nausea vomiting/electrolyte abnormalities  Consults called:  Oncology informed via epic of patient's admission, Dr. Burr Medico Admission status:  Place in observation  Severity of Illness:     Irine Seal MD [facility]  How to contact the [facility] Attending or Consulting provider [place] or covering provider during after hours [place], for this patient?   1. Check the care team in [facility] and look for a) attending/consulting [facility] provider listed and b) the [facility] team listed 2. Log into [email or web address] and use [place]'s universal password to access. If you do not have the password, please contact the hospital operator. 3. Locate the [facility] provider you are looking for under [facility] and page to a number that you can be directly reached. 4. If you still have difficulty reaching the  provider, please page the [facility] (Director on Call) for the Hospitalists listed on amion for assistance.  11/24/2019, 6:48 PM

## 2019-11-24 NOTE — ED Notes (Signed)
Patient transported to X-ray 

## 2019-11-24 NOTE — ED Triage Notes (Addendum)
Chemo Wed-Fri with continued emesis. Lower left back pain.

## 2019-11-25 DIAGNOSIS — N390 Urinary tract infection, site not specified: Secondary | ICD-10-CM | POA: Diagnosis present

## 2019-11-25 DIAGNOSIS — E876 Hypokalemia: Secondary | ICD-10-CM | POA: Diagnosis present

## 2019-11-25 DIAGNOSIS — I10 Essential (primary) hypertension: Secondary | ICD-10-CM | POA: Diagnosis present

## 2019-11-25 DIAGNOSIS — Z515 Encounter for palliative care: Secondary | ICD-10-CM | POA: Diagnosis not present

## 2019-11-25 DIAGNOSIS — E6609 Other obesity due to excess calories: Secondary | ICD-10-CM | POA: Diagnosis present

## 2019-11-25 DIAGNOSIS — R112 Nausea with vomiting, unspecified: Secondary | ICD-10-CM | POA: Diagnosis not present

## 2019-11-25 DIAGNOSIS — R000 Tachycardia, unspecified: Secondary | ICD-10-CM | POA: Diagnosis present

## 2019-11-25 DIAGNOSIS — I82422 Acute embolism and thrombosis of left iliac vein: Secondary | ICD-10-CM | POA: Diagnosis present

## 2019-11-25 DIAGNOSIS — K224 Dyskinesia of esophagus: Secondary | ICD-10-CM | POA: Diagnosis present

## 2019-11-25 DIAGNOSIS — D630 Anemia in neoplastic disease: Secondary | ICD-10-CM | POA: Diagnosis present

## 2019-11-25 DIAGNOSIS — K5900 Constipation, unspecified: Secondary | ICD-10-CM | POA: Diagnosis present

## 2019-11-25 DIAGNOSIS — R1084 Generalized abdominal pain: Secondary | ICD-10-CM

## 2019-11-25 DIAGNOSIS — G43709 Chronic migraine without aura, not intractable, without status migrainosus: Secondary | ICD-10-CM | POA: Diagnosis present

## 2019-11-25 DIAGNOSIS — M329 Systemic lupus erythematosus, unspecified: Secondary | ICD-10-CM | POA: Diagnosis present

## 2019-11-25 DIAGNOSIS — R1312 Dysphagia, oropharyngeal phase: Secondary | ICD-10-CM | POA: Diagnosis present

## 2019-11-25 DIAGNOSIS — E860 Dehydration: Secondary | ICD-10-CM | POA: Diagnosis present

## 2019-11-25 DIAGNOSIS — J45909 Unspecified asthma, uncomplicated: Secondary | ICD-10-CM | POA: Diagnosis present

## 2019-11-25 DIAGNOSIS — C250 Malignant neoplasm of head of pancreas: Secondary | ICD-10-CM | POA: Diagnosis present

## 2019-11-25 DIAGNOSIS — K2970 Gastritis, unspecified, without bleeding: Secondary | ICD-10-CM | POA: Diagnosis present

## 2019-11-25 DIAGNOSIS — D6481 Anemia due to antineoplastic chemotherapy: Secondary | ICD-10-CM | POA: Diagnosis present

## 2019-11-25 DIAGNOSIS — K2210 Ulcer of esophagus without bleeding: Secondary | ICD-10-CM | POA: Diagnosis present

## 2019-11-25 DIAGNOSIS — R9431 Abnormal electrocardiogram [ECG] [EKG]: Secondary | ICD-10-CM | POA: Diagnosis present

## 2019-11-25 DIAGNOSIS — T451X5A Adverse effect of antineoplastic and immunosuppressive drugs, initial encounter: Secondary | ICD-10-CM | POA: Diagnosis present

## 2019-11-25 DIAGNOSIS — C787 Secondary malignant neoplasm of liver and intrahepatic bile duct: Secondary | ICD-10-CM | POA: Diagnosis present

## 2019-11-25 DIAGNOSIS — Z7189 Other specified counseling: Secondary | ICD-10-CM | POA: Diagnosis not present

## 2019-11-25 DIAGNOSIS — Z20822 Contact with and (suspected) exposure to covid-19: Secondary | ICD-10-CM | POA: Diagnosis present

## 2019-11-25 DIAGNOSIS — E440 Moderate protein-calorie malnutrition: Secondary | ICD-10-CM | POA: Diagnosis present

## 2019-11-25 LAB — COMPREHENSIVE METABOLIC PANEL
ALT: 27 U/L (ref 0–44)
AST: 24 U/L (ref 15–41)
Albumin: 3 g/dL — ABNORMAL LOW (ref 3.5–5.0)
Alkaline Phosphatase: 88 U/L (ref 38–126)
Anion gap: 8 (ref 5–15)
BUN: 7 mg/dL (ref 6–20)
CO2: 25 mmol/L (ref 22–32)
Calcium: 8.8 mg/dL — ABNORMAL LOW (ref 8.9–10.3)
Chloride: 106 mmol/L (ref 98–111)
Creatinine, Ser: 0.55 mg/dL (ref 0.44–1.00)
GFR, Estimated: >60 mL/min (ref >60)
Glucose, Bld: 96 mg/dL (ref 70–99)
Potassium: 4.6 mmol/L (ref 3.5–5.1)
Sodium: 139 mmol/L (ref 135–145)
Total Bilirubin: 1 mg/dL (ref 0.3–1.2)
Total Protein: 6.4 g/dL — ABNORMAL LOW (ref 6.5–8.1)

## 2019-11-25 LAB — CBC
HCT: 23.5 % — ABNORMAL LOW (ref 36.0–46.0)
Hemoglobin: 7.5 g/dL — ABNORMAL LOW (ref 12.0–15.0)
MCH: 29.8 pg (ref 26.0–34.0)
MCHC: 31.9 g/dL (ref 30.0–36.0)
MCV: 93.3 fL (ref 80.0–100.0)
Platelets: 151 10*3/uL (ref 150–400)
RBC: 2.52 MIL/uL — ABNORMAL LOW (ref 3.87–5.11)
RDW: 15.6 % — ABNORMAL HIGH (ref 11.5–15.5)
WBC: 6.2 10*3/uL (ref 4.0–10.5)
nRBC: 0 % (ref 0.0–0.2)

## 2019-11-25 LAB — IRON AND TIBC
Iron: 28 ug/dL (ref 28–170)
Saturation Ratios: 20 % (ref 10.4–31.8)
TIBC: 141 ug/dL — ABNORMAL LOW (ref 250–450)
UIBC: 113 ug/dL

## 2019-11-25 LAB — PHOSPHORUS: Phosphorus: 3.1 mg/dL (ref 2.5–4.6)

## 2019-11-25 LAB — GLUCOSE, CAPILLARY: Glucose-Capillary: 97 mg/dL (ref 70–99)

## 2019-11-25 LAB — VITAMIN B12: Vitamin B-12: 1356 pg/mL — ABNORMAL HIGH (ref 180–914)

## 2019-11-25 LAB — FERRITIN: Ferritin: 1445 ng/mL — ABNORMAL HIGH (ref 11–307)

## 2019-11-25 LAB — MAGNESIUM: Magnesium: 2.3 mg/dL (ref 1.7–2.4)

## 2019-11-25 LAB — FOLATE: Folate: 28.1 ng/mL (ref >5.9)

## 2019-11-25 MED ORDER — CHLORHEXIDINE GLUCONATE CLOTH 2 % EX PADS
6.0000 | MEDICATED_PAD | Freq: Every day | CUTANEOUS | Status: DC
  Administered 2019-11-26 – 2019-12-12 (×14): 6 via TOPICAL

## 2019-11-25 MED ORDER — SODIUM CHLORIDE 0.9 % IV SOLN: INTRAVENOUS | Status: AC

## 2019-11-25 NOTE — Progress Notes (Signed)
Cynthia Frye   DOB:1960/07/02   HL#:[id number]   [id number]  Subjective:  Cynthia Frye c/o uncontrolled nausea and vomiting and unable to keep herself hydrated. She was directed to the ED and admitted (as she was after her first cycle of chemo). This AM she is feeling "better"-- on dexamethasone QD, lorazepam TID, and PRN ondansetron. Pain is well-controlled. No BM since 11/21/2019. No vomiting overnight and slept well. No family in room   Objective: [name] examined in bed Vitals:   11/25/19 0410 11/25/19 0813  BP: 128/79 (!) 141/87  Pulse: 92 82  Resp: 14 16  Temp: 98.1 F (36.7 C) 98.6 F (37 C)  SpO2: 100% 100%    Body mass index is 27.9 kg/m.  Intake/Output Summary (Last 24 hours) at 11/25/2019 1051 Last data filed at 11/25/2019 0300 Gross per 24 hour  Intake 1300 ml  Output --  Net 1300 ml      Lungs no rales or wheezes--auscultated anterolaterally  Heart regular rate and rhythm  Abdomen soft, +BS  Neuro nonfocal    CBG (last 3)  Recent Labs    11/25/19 0811  GLUCAP 97     Labs:  Lab Results  Component Value Date   WBC 6.2 11/25/2019   HGB 7.5 (L) 11/25/2019   HCT 23.5 (L) 11/25/2019   MCV 93.3 11/25/2019   PLT 151 11/25/2019   NEUTROABS 4.7 11/24/2019    @LASTCHEMISTRY @  Urine Studies No results for input(s): UHGB, CRYS in the last 72 hours.  Invalid input(s): UACOL, UAPR, USPG, UPH, UTP, UGL, UKET, UBIL, UNIT, UROB, ULEU, UEPI, UWBC, URBC, UBAC, CAST, [place], [place]  Basic Metabolic Panel: Recent Labs  Lab 11/21/19 0934 11/21/19 0934 11/24/19 1427 11/24/19 1504 11/25/19 0626  NA 139  --  136  --  139  K 3.2*   < > 2.7*  --  4.6  CL 101  --  97*  --  106  CO2 27  --  27  --  25  GLUCOSE 87  --  94  --  96  BUN 7  --  8  --  7  CREATININE 0.65  --  0.50  --  0.55  CALCIUM 9.7  --  9.1  --  8.8*  MG  --   --   --  1.7 2.3  PHOS  --   --   --   --  3.1   < > = values in this interval not displayed.    GFR Estimated Creatinine Clearance: 83.9 mL/min (by C-G formula based on SCr of 0.55 mg/dL). Liver Function Tests: Recent Labs  Lab 11/21/19 0934 11/25/19 0626  AST 41 24  ALT 34 27  ALKPHOS 124 88  BILITOT 0.6 1.0  PROT 7.1 6.4*  ALBUMIN 3.5 3.0*   No results for input(s): LIPASE, AMYLASE in the last 168 hours. No results for input(s): AMMONIA in the last 168 hours. Coagulation profile No results for input(s): INR, PROTIME in the last 168 hours.  CBC: Recent Labs  Lab 11/21/19 0934 11/24/19 1427 11/25/19 0626  WBC 4.6 5.6 6.2  NEUTROABS 3.0 4.7  --   HGB 8.8* 8.2* 7.5*  HCT 26.4* 25.0* 23.5*  MCV 89.2 90.9 93.3  PLT 253 160 151   Cardiac Enzymes: No results for input(s): CKTOTAL, CKMB, CKMBINDEX, TROPONINI in the last 168 hours. BNP: Invalid input(s): POCBNP CBG: Recent Labs  Lab 11/25/19 0811  GLUCAP 97   D-Dimer No results for  input(s): DDIMER in the last 72 hours. Hgb A1c No results for input(s): HGBA1C in the last 72 hours. Lipid Profile No results for input(s): CHOL, HDL, LDLCALC, TRIG, CHOLHDL, LDLDIRECT in the last 72 hours. Thyroid function studies No results for input(s): TSH, T4TOTAL, T3FREE, THYROIDAB in the last 72 hours.  Invalid input(s): FREET3 Anemia work up No results for input(s): VITAMINB12, FOLATE, FERRITIN, TIBC, IRON, RETICCTPCT in the last 72 hours. Microbiology Recent Results (from the past 240 hour(s))  Respiratory Panel by RT PCR (Flu A&B, Covid) - Nasopharyngeal Swab     Status: None   Collection Time: 11/24/19  3:34 PM   Specimen: Nasopharyngeal Swab  Result Value Ref Range Status   SARS Coronavirus 2 by RT PCR NEGATIVE NEGATIVE Final    Comment: (NOTE) SARS-CoV-2 target nucleic acids are NOT DETECTED.  The SARS-CoV-2 RNA is generally detectable in upper respiratoy specimens during the acute phase of infection. The lowest concentration of SARS-CoV-2 viral copies this assay can detect is 131 copies/mL. A negative  result does not preclude SARS-Cov-2 infection and should not be used as the sole basis for treatment or other patient management decisions. A negative result may occur with  improper specimen collection/handling, submission of specimen other than nasopharyngeal swab, presence of viral mutation(s) within the areas targeted by this assay, and inadequate number of viral copies (<131 copies/mL). A negative result must be combined with clinical observations, patient history, and epidemiological information. The expected result is Negative.  Fact Sheet for Patients:  PinkCheek.be  Fact Sheet for Healthcare Providers:  GravelBags.it  This test is no t yet approved or cleared by the [place] FDA and  has been authorized for detection and/or diagnosis of SARS-CoV-2 by FDA under an Emergency Use Authorization (EUA). This EUA will remain  in effect (meaning this test can be used) for the duration of the COVID-19 declaration under Section 564(b)(1) of the Act, 21 U.S.C. section 360bbb-3(b)(1), unless the authorization is terminated or revoked sooner.     Influenza A by PCR NEGATIVE NEGATIVE Final   Influenza B by PCR NEGATIVE NEGATIVE Final    Comment: (NOTE) The Xpert Xpress SARS-CoV-2/FLU/RSV assay is intended as an aid in  the diagnosis of influenza from Nasopharyngeal swab specimens and  should not be used as a sole basis for treatment. Nasal washings and  aspirates are unacceptable for Xpert Xpress SARS-CoV-2/FLU/RSV  testing.  Fact Sheet for Patients: PinkCheek.be  Fact Sheet for Healthcare Providers: GravelBags.it  This test is not yet approved or cleared by the [place] FDA and  has been authorized for detection and/or diagnosis of SARS-CoV-2 by  FDA under an Emergency Use Authorization (EUA). This EUA will remain  in effect (meaning this test can be used)  for the duration of the  Covid-19 declaration under Section 564(b)(1) of the Act, 21  U.S.C. section 360bbb-3(b)(1), unless the authorization is  terminated or revoked. Performed at [facility], [place] [address], [place], [place] [address]       Studies:  DG Chest 2 View  Result Date: 11/24/2019 CLINICAL DATA:  Nausea and vomiting. EXAM: CHEST - 2 VIEW COMPARISON:  November 04, 2019 FINDINGS: There is stable left-sided venous Port-A-Cath positioning. The heart size and mediastinal contours are within normal limits. Both lungs are clear. The visualized skeletal structures are unremarkable. IMPRESSION: No active cardiopulmonary disease. Electronically Signed   By: Virgina Norfolk M.D.   On: 11/24/2019 18:52   CT Abdomen Pelvis W Contrast  Result Date:  11/24/2019 CLINICAL DATA:  Abdominal distension, pain, nausea, vomiting. Pancreatic cancer EXAM: CT ABDOMEN AND PELVIS WITH CONTRAST TECHNIQUE: Multidetector CT imaging of the abdomen and pelvis was performed using the standard protocol following bolus administration of intravenous contrast. CONTRAST:  176mL OMNIPAQUE IOHEXOL 300 MG/ML  SOLN COMPARISON:  11/04/2019 FINDINGS: Lower chest: Scarring at the left lung base.  No acute abnormality. Hepatobiliary: Numerous lesions throughout the liver, enlarging since prior study. Index right hepatic dome lesion measures 2.1 cm compared to 1.6 cm previously. Inferior right hepatic lesion measures 1.4 cm compared with 9 mm previously. Gallbladder unremarkable. Pancreas: Pancreatic head mass measuring up to 3.4 cm compared to 3.2 cm previously, likely not significantly changed. Pancreatic ductal dilatation in the body and tail. Spleen: No focal abnormality.  Normal size. Adrenals/Urinary Tract: No adrenal abnormality. No focal renal abnormality. No stones or hydronephrosis. Urinary bladder is unremarkable. Stomach/Bowel: Stomach, large and small bowel grossly unremarkable. Normal appendix.  Vascular/Lymphatic: No evidence of aneurysm or adenopathy. Reproductive: No mass or abnormality noted. Other: No free fluid or free air. Musculoskeletal: No acute bony abnormality. IMPRESSION: Pancreatic head mass again noted compatible with patient's given history of pancreatic cancer. Numerous metastases throughout the liver, enlarging since prior study. Electronically Signed   By: Rolm Baptise M.D.   On: 11/24/2019 17:02    Assessment: 59 y.o. [place] woman with stage IV pancreatic cancer initially diagnosed 09/25/2019, currently day 5 cycle 2 FOLFIRINOX, admitted with uncontrolled nausea, vomiting and constipation  Plan: Symptomatically better. For enema this AM. Tolerates TID lorazepam well and this may be helpful if there is a significant component of associative nausea.  CT abd Frye shows no evidence of obstruction so if enema does not work OK to use magnesium citrate, 1/2 bottle po and if no results repeat after 2 h  Appreciate your excellent care of this patient. Will alert Dr Burr Medico her oncologist re admission.    Chauncey Cruel, MD 11/25/2019  10:51 AM Medical Oncology and Hematology [facility] [address] [place], [place] [address] Tel. [phone]    Fax. [phone]

## 2019-11-25 NOTE — Evaluation (Signed)
Physical Therapy Evaluation Patient Details Name: Cynthia Frye MRN: [id number] DOB: Jan 15, 1961 Today's Date: 11/25/2019   History of Present Illness  59 year old unfortunate female history of recently diagnosed metastatic pancreatic adenocarcinoma with mets to the liver 09/19/2019, currently undergoing chemotherapy, recent chemotherapy 11/21/2019 presenting with 1 day history of worsening nausea, vomiting, abdominal pain.  Noted to have significant hypokalemia, admitted to [facility]  Clinical Impression  Pt admitted with above diagnosis.  Pt feeling much weaker than her baseline. Able to amb ~ 85'  With min-min/guard assist, guarded, slow gait limited by fatigue.  Will continue to follow in acute setting.  Pt currently with functional limitations due to the deficits listed below (see PT Problem List). Pt will benefit from skilled PT to increase their independence and safety with mobility to allow discharge to the venue listed below.       Follow Up Recommendations No PT follow up    Equipment Recommendations  None recommended by PT    Recommendations for Other Services       Precautions / Restrictions Precautions Precautions: Fall Restrictions Weight Bearing Restrictions: No      Mobility  Bed Mobility               General bed mobility comments: pt in recliner    Transfers Overall transfer level: Needs assistance Equipment used: None Transfers: Sit to/from Stand Sit to Stand: Min guard         General transfer comment: incr time, min/guard for safety on rising  Ambulation/Gait Ambulation/Gait assistance: Min guard Gait Distance (Feet): 60 Feet Assistive device: IV Pole Gait Pattern/deviations: Step-through pattern;Decreased stride length;Narrow base of support     General Gait Details: guarded, slow gait. unsteady but without overt LOB. min/guard for safety. fatigues quickly  Stairs            Wheelchair Mobility    Modified Rankin (Stroke Patients  Only)       Balance Overall balance assessment: Needs assistance   Sitting balance-Leahy Scale: Good       Standing balance-Leahy Scale: Fair                               Pertinent Vitals/Pain Pain Assessment: No/denies pain    Home Living Family/patient expects to be discharged to:: Private residence Living Arrangements: Spouse/significant other Available Help at Discharge: Family Type of Home: House Home Access: Stairs to enter   Technical brewer of Steps: 4   Home Equipment: None      Prior Function Level of Independence: Independent         Comments: spouse works but assists with household tasks as needed     Journalist, newspaper        Extremity/Trunk Assessment   Upper Extremity Assessment Upper Extremity Assessment: Overall WFL for tasks assessed    Lower Extremity Assessment Lower Extremity Assessment: Overall WFL for tasks assessed (pt reports feeling weaker in her LEs than normal; expericences rapide muscle fatigue with light activity)       Communication   Communication: No difficulties  Cognition Arousal/Alertness: Awake/alert Behavior During Therapy: WFL for tasks assessed/performed;Flat affect Overall Cognitive Status: Within Functional Limits for tasks assessed                                        General Comments  Exercises General Exercises - Lower Extremity Ankle Circles/Pumps: AROM;Both;5 reps Long Arc Quad: AROM;Both;5 reps;Seated Hip Flexion/Marching: AROM;Both;5 reps;Seated Heel Raises: AROM;Both;5 reps;Seated Other Exercises Other Exercises: reviewed sit<>stands/mini-squats   Assessment/Plan    PT Assessment Patient needs continued PT services  PT Problem List Decreased strength;Decreased mobility;Decreased activity tolerance       PT Treatment Interventions DME instruction;Therapeutic activities;Gait training;Functional mobility training;Therapeutic exercise;Patient/family  education    PT Goals (Current goals can be found in the Care Plan section)  Acute Rehab PT Goals Patient Stated Goal: get stronger PT Goal Formulation: With patient Time For Goal Achievement: 12/09/19 Potential to Achieve Goals: Good    Frequency Min 3X/week   Barriers to discharge        Co-evaluation               AM-PAC PT "6 Clicks" Mobility  Outcome Measure Help needed turning from your back to your side while in a flat bed without using bedrails?: None Help needed moving from lying on your back to sitting on the side of a flat bed without using bedrails?: None Help needed moving to and from a bed to a chair (including a wheelchair)?: A Little Help needed standing up from a chair using your arms (e.g., wheelchair or bedside chair)?: A Little Help needed to walk in hospital room?: A Little Help needed climbing 3-5 steps with a railing? : A Little 6 Click Score: 20    End of Session Equipment Utilized During Treatment: Gait belt Activity Tolerance: Patient limited by fatigue Patient left: in chair;with call bell/phone within reach   PT Visit Diagnosis: Difficulty in walking, not elsewhere classified (R26.2)    Time: 4128-7867 PT Time Calculation (min) (ACUTE ONLY): 14 min   Charges:   PT Evaluation $PT Eval Low Complexity: [place], PT  Acute Rehab Dept ([place]) [phone] Pager [phone]  11/25/2019   [facility] 11/25/2019, 2:11 PM

## 2019-11-25 NOTE — Evaluation (Signed)
Occupational Therapy Evaluation Patient Details Name: Cynthia Frye MRN: [id number] DOB: 10-Dec-1960 Today's Date: 11/25/2019    History of Present Illness 59 year old unfortunate female history of recently diagnosed metastatic pancreatic adenocarcinoma with mets to the liver 09/19/2019, currently undergoing chemotherapy, recent chemotherapy 11/21/2019 presenting with 1 day history of worsening nausea, vomiting, abdominal pain.  Noted to have significant hypokalemia, admitted to [facility]   Clinical Impression   Pt admitted with hypokalemia. Pt currently with functional limitations due to the deficits listed below (see OT Problem List).  Pt will benefit from skilled OT to increase their safety and independence with ADL and functional mobility for ADL to facilitate discharge to venue listed below.      Follow Up Recommendations  Supervision/Assistance - 24 hour    Equipment Recommendations  None recommended by OT    Recommendations for Other Services       Precautions / Restrictions Precautions Precautions: Fall Restrictions Weight Bearing Restrictions: No      Mobility Bed Mobility Overal bed mobility: Needs Assistance Bed Mobility: Supine to Sit     Supine to sit: Min assist     General bed mobility comments: pt in recliner    Transfers Overall transfer level: Needs assistance Equipment used: None Transfers: Sit to/from [facility] Sit to Stand: Min assist Stand pivot transfers: Min assist       General transfer comment: incr time, min/guard for safety on rising    Balance Overall balance assessment: Needs assistance   Sitting balance-Leahy Scale: Good       Standing balance-Leahy Scale: Fair                             ADL either performed or assessed with clinical judgement   ADL Overall ADL's : Needs assistance/impaired Eating/Feeding: Set up;Sitting   Grooming: Set up;Sitting   Upper Body Bathing: Set up;Sitting   Lower  Body Bathing: Sit to/from stand;Minimal assistance   Upper Body Dressing : Set up;Sitting   Lower Body Dressing: Minimal assistance;Sit to/from stand   Toilet Transfer: Minimal assistance;Cueing for sequencing;Cueing for safety;Regular Toilet   Toileting- Clothing Manipulation and Hygiene: Minimal assistance;Sit to/from stand;Cueing for safety;Cueing for sequencing       Functional mobility during ADLs: Min guard                    Pertinent Vitals/Pain Pain Assessment: No/denies pain     Hand Dominance     Extremity/Trunk Assessment Upper Extremity Assessment Upper Extremity Assessment: Generalized weakness   Lower Extremity Assessment Lower Extremity Assessment: Overall WFL for tasks assessed (pt reports feeling weaker in her LEs than normal; expericences rapide muscle fatigue with light activity)       Communication Communication Communication: No difficulties   Cognition Arousal/Alertness: Awake/alert Behavior During Therapy: WFL for tasks assessed/performed;Flat affect Overall Cognitive Status: Within Functional Limits for tasks assessed                                                Home Living Family/patient expects to be discharged to:: Private residence Living Arrangements: Spouse/significant other Available Help at Discharge: Family Type of Home: House Home Access: Stairs to enter Technical brewer of Steps: 4   Home Layout: One level     Bathroom Shower/Tub: Teacher, early years/pre:  Standard     Home Equipment: None          Prior Functioning/Environment Level of Independence: Independent        Comments: spouse works but assists with household tasks as needed        OT Problem List: Decreased strength;Decreased activity tolerance      OT Treatment/Interventions: Self-care/ADL training;Patient/family education;DME and/or AE instruction;Therapeutic activities    OT Goals(Current goals can be  found in the care plan section) Acute Rehab OT Goals Patient Stated Goal: get stronger OT Goal Formulation: With patient Time For Goal Achievement: 12/09/19 ADL Goals Pt Will Perform Lower Body Bathing: with supervision;sit to/from stand Pt Will Perform Lower Body Dressing: with supervision;sit to/from stand Pt Will Transfer to Toilet: with supervision;ambulating;regular height toilet Pt Will Perform Toileting - Clothing Manipulation and hygiene: with supervision;sit to/from stand  OT Frequency: Min 2X/week    AM-PAC OT "6 Clicks" Daily Activity     Outcome Measure Help from another person eating meals?: None Help from another person taking care of personal grooming?: None Help from another person toileting, which includes using toliet, bedpan, or urinal?: A Little Help from another person bathing (including washing, rinsing, drying)?: A Little Help from another person to put on and taking off regular upper body clothing?: None Help from another person to put on and taking off regular lower body clothing?: A Little 6 Click Score: 21   End of Session Nurse Communication: Mobility status  Activity Tolerance: Patient limited by fatigue Patient left: in chair  OT Visit Diagnosis: Muscle weakness (generalized) (M62.81)                Time: 1320-1340 OT Time Calculation (min): 20 min Charges:  OT General Charges $OT Visit: 1 Visit OT Evaluation $OT Eval Moderate Complexity: 1 Mod  Kari Baars, OT Acute Rehabilitation Services [phone] Office- [place], Edwena Felty D 11/25/2019, 3:05 PM

## 2019-11-25 NOTE — Progress Notes (Addendum)
PROGRESS NOTE    Cynthia Frye  EHM:[id number] DOB: 03/23/1960 DOA: 11/24/2019 PCP: Josetta Huddle, MD   Chief Complaint  Patient presents with  . Emesis    Brief Narrative:  Patient pleasant 59 year old unfortunate female history of recently diagnosed metastatic pancreatic adenocarcinoma with mets to the liver 09/19/2019, currently undergoing chemotherapy, recent chemotherapy 11/21/2019 presenting with 1 day history of worsening nausea, vomiting, abdominal pain.  Noted to have significant hypokalemia with inability to keep anything down.  CT abdomen and pelvis negative for small bowel obstruction.  Urinalysis concerning for UTI.  Patient started on IV antibiotics, scheduled IV antiemetics, IV fluids, potassium repletion.   Assessment & Plan:   Principal Problem:   Hypokalemia Active Problems:   Essential hypertension   Lupus (systemic lupus erythematosus) (HCC)   Seasonal allergies   Chronic migraine without aura without status migrainosus, not intractable   Pancreatic cancer (HCC)   Acute deep vein thrombosis of left iliac vein (HCC)   Nausea and vomiting in adult   Dehydration   Prolonged QT interval   Emesis   Tachycardia   Anemia associated with chemotherapy   Constipation  1  Hypokalemia Secondary to GI losses from nausea and emesis postchemotherapy.  Magnesium level at 1.7 on admission.  Potassium was 2.7 on admission.  Potassium repleted and currently at 4.6.  Status post 6 rounds of KCl 10 mEq IV.  Discontinue potassium from IV fluids.  Magnesium repleted and currently at 2.3.  IV fluids.  Supportive care..   2.  Nausea and vomiting Likely chemotherapy induced.  Patient with recent chemotherapy 11/21/2019.  Patient noted to have had similar episodes recently after first cycle of chemotherapy with nausea vomiting and diarrhea and had to be hospitalized from 11/04/2019-11/09/2019 in addition to colitis.    Currently n.p.o. except ice chips.  Clinical improvement on  current management of scheduled IV Ativan.  Due to QTC prolongation patient placed on scheduled IV Ativan which will continue for another 24 hours.  IV Zofran as needed.  Repeat EKG pending.  Chest x-ray negative for acute aspiration.  IV fluids.  Place on clear liquid diet.  Supportive care.  Follow.  3.  Dehydration Change IV fluids to normal saline at 75 cc an hour.    4.  Tachycardia Likely secondary to volume depletion, nausea vomiting, possible beta-blockade withdrawal as patient unable to keep medications down.  Tachycardia improved.  Continue hydration with IV fluids.  Continue current regimen of IV Lopressor when tolerating oral intake could likely transition back to home regimen of Toprol-XL.  Pain management.  Antiemetics.  Supportive care.   5.  Prolonged QT interval Repeat EKG with resolution of QTC prolongation.  Keep magnesium >2, potassium >4.  Avoid QTC prolongation medications.  Supportive care.  Follow.  6.  Pancreatic adenocarcinoma in the head, with multiple liver lesions likely liver metastases Recently diagnosed per CT scan on 09/19/2019, EUS biopsy per Dr. Paulita Fujita 09/25/2019 consistent with pancreatic adenocarcinoma.  Patient status post cycle 1 FOLFIRINOX 10/24/2019.  Patient stated started cycle 2 of chemotherapy 11/21/2019.  Patient noted per med rec to be on Decadron 4 mg daily after chemotherapy for 3 to 5 days and as such due to nausea and vomiting patient started on Decadron 4 mg IV daily x3 days.  Oncology, Dr. Burr Medico informed via epic of patient's admission.  Continue current pain management.  Supportive care.   7.  Recent left leg vein DVT Full dose Lovenox.    8.  Upper abdominal  cramps Likely secondary to metastatic pancreatic adenocarcinoma.    Patient also with some complaints of constipation.  Patient noted to be on oral oxycodone at home for her lupus prior to admission however due to ongoing nausea and vomiting patient placed on IV Dilaudid as needed.  Pain  currently controlled on current regimen of IV Dilaudid.  Continue IV PPI twice daily for now and when tolerating oral intake will transition to oral PPI.  Supportive care.  Follow.    7.  Lupus Being followed in the outpatient setting by rheumatologist, Dr. Trudie Reed on weekly methotrexate injections, Plaquenil and diclofenac.  Outpatient follow-up with rheumatology.  10.  Probable UTI Urinalysis with moderate leukocytes, nitrite negative, rare bacteria, 21-50 WBCs.  Urine cultures pending.  Patient with metastatic pancreatic cancer on chemotherapy and as such patient started on IV Rocephin.  Continue IV Rocephin pending urine culture results.  11.  Anxiety Currently controlled on scheduled Ativan for nausea and emesis.  Supportive care.  Follow.   12.  Hypertension Continue IV Lopressor.  Once tolerating oral intake could likely transition back to home regimen of oral antihypertensive medications.  Follow.    13.  Constipation Patient placed on daily Dulcolax suppositories with no results.  Soapsuds enema x1.  Follow.  14.  Anemia Likely chemo induced.  Patient with no overt bleeding.  Check an anemia panel.  Follow H&H.  Transfusion threshold hemoglobin <7.     DVT prophylaxis: Lovenox Code Status: Full Family Communication: Updated patient.  No family at bedside. Disposition:   Status is: Observation    Dispo: The patient is from: Home              Anticipated d/c is to: Home              Anticipated d/c date is: 11/27/2019              Patient currently on IV antibiotics, IV antiemetics, IV fluids, not stable for discharge.       Consultants:   None  Procedures:   Chest x-ray 11/24/2019  CT abdomen and pelvis 11/24/2019  Antimicrobials:   IV Rocephin 11/24/2019   Subjective: Patient laying in bed.  No emesis this morning.  Nausea improved.  Complains of constipation, states has not had a bowel movement.  Feels better than she did on admission.  Stated she  slept well this morning.  Tolerating ice chips.  Objective: Vitals:   11/24/19 1921 11/24/19 2319 11/25/19 0410 11/25/19 0813  BP: (!) 144/87 137/83 128/79 (!) 141/87  Pulse: 92 97 92 82  Resp: 14 16 14 16   Temp: 98.6 F (37 C) 99.7 F (37.6 C) 98.1 F (36.7 C) 98.6 F (37 C)  TempSrc: Oral Oral Oral Oral  SpO2: 100% 100% 100% 100%  Weight: 80.8 kg     Height:        Intake/Output Summary (Last 24 hours) at 11/25/2019 1000 Last data filed at 11/25/2019 0300 Gross per 24 hour  Intake 1300 ml  Output -  Net 1300 ml   Filed Weights   11/24/19 1332 11/24/19 1348 11/24/19 1921  Weight: 79.4 kg 79.4 kg 80.8 kg    Examination:  General exam: Appears calm and comfortable  Respiratory system: Clear to auscultation. Respiratory effort normal. Cardiovascular system: S1 & S2 heard, RRR. No JVD, murmurs, rubs, gallops or clicks. No pedal edema. Gastrointestinal system: Abdomen is nondistended, soft and some pressure/tenderness to palpation in the epigastrium improved from yesterday.  Positive bowel  sounds.  No rebound.  No guarding.  Central nervous system: Alert and oriented. No focal neurological deficits. Extremities: Symmetric 5 x 5 power. Skin: No rashes, lesions or ulcers Psychiatry: Judgement and insight appear normal. Mood & affect appropriate.     Data Reviewed: I have personally reviewed following labs and imaging studies  CBC: Recent Labs  Lab 11/21/19 0934 11/24/19 1427 11/25/19 0626  WBC 4.6 5.6 6.2  NEUTROABS 3.0 4.7  --   HGB 8.8* 8.2* 7.5*  HCT 26.4* 25.0* 23.5*  MCV 89.2 90.9 93.3  PLT 253 160 161    Basic Metabolic Panel: Recent Labs  Lab 11/21/19 0934 11/24/19 1427 11/24/19 1504 11/25/19 0626  NA 139 136  --  139  K 3.2* 2.7*  --  4.6  CL 101 97*  --  106  CO2 27 27  --  25  GLUCOSE 87 94  --  96  BUN 7 8  --  7  CREATININE 0.65 0.50  --  0.55  CALCIUM 9.7 9.1  --  8.8*  MG  --   --  1.7 2.3  PHOS  --   --   --  3.1    GFR:  Estimated Creatinine Clearance: 83.9 mL/min (by C-G formula based on SCr of 0.55 mg/dL).  Liver Function Tests: Recent Labs  Lab 11/21/19 0934 11/25/19 0626  AST 41 24  ALT 34 27  ALKPHOS 124 88  BILITOT 0.6 1.0  PROT 7.1 6.4*  ALBUMIN 3.5 3.0*    CBG: Recent Labs  Lab 11/25/19 0811  GLUCAP 97     Recent Results (from the past 240 hour(s))  Respiratory Panel by RT PCR (Flu A&B, Covid) - Nasopharyngeal Swab     Status: None   Collection Time: 11/24/19  3:34 PM   Specimen: Nasopharyngeal Swab  Result Value Ref Range Status   SARS Coronavirus 2 by RT PCR NEGATIVE NEGATIVE Final    Comment: (NOTE) SARS-CoV-2 target nucleic acids are NOT DETECTED.  The SARS-CoV-2 RNA is generally detectable in upper respiratoy specimens during the acute phase of infection. The lowest concentration of SARS-CoV-2 viral copies this assay can detect is 131 copies/mL. A negative result does not preclude SARS-Cov-2 infection and should not be used as the sole basis for treatment or other patient management decisions. A negative result may occur with  improper specimen collection/handling, submission of specimen other than nasopharyngeal swab, presence of viral mutation(s) within the areas targeted by this assay, and inadequate number of viral copies (<131 copies/mL). A negative result must be combined with clinical observations, patient history, and epidemiological information. The expected result is Negative.  Fact Sheet for Patients:  PinkCheek.be  Fact Sheet for Healthcare Providers:  GravelBags.it  This test is no t yet approved or cleared by the [place] FDA and  has been authorized for detection and/or diagnosis of SARS-CoV-2 by FDA under an Emergency Use Authorization (EUA). This EUA will remain  in effect (meaning this test can be used) for the duration of the COVID-19 declaration under Section 564(b)(1) of the Act, 21  U.S.C. section 360bbb-3(b)(1), unless the authorization is terminated or revoked sooner.     Influenza A by PCR NEGATIVE NEGATIVE Final   Influenza B by PCR NEGATIVE NEGATIVE Final    Comment: (NOTE) The Xpert Xpress SARS-CoV-2/FLU/RSV assay is intended as an aid in  the diagnosis of influenza from Nasopharyngeal swab specimens and  should not be used as a sole basis for treatment. Nasal washings and  aspirates are unacceptable for Xpert Xpress SARS-CoV-2/FLU/RSV  testing.  Fact Sheet for Patients: PinkCheek.be  Fact Sheet for Healthcare Providers: GravelBags.it  This test is not yet approved or cleared by the [place] FDA and  has been authorized for detection and/or diagnosis of SARS-CoV-2 by  FDA under an Emergency Use Authorization (EUA). This EUA will remain  in effect (meaning this test can be used) for the duration of the  Covid-19 declaration under Section 564(b)(1) of the Act, 21  U.S.C. section 360bbb-3(b)(1), unless the authorization is  terminated or revoked. Performed at [facility], [place] [address], [place], [place] [address]          Radiology Studies: DG Chest 2 View  Result Date: 11/24/2019 CLINICAL DATA:  Nausea and vomiting. EXAM: CHEST - 2 VIEW COMPARISON:  November 04, 2019 FINDINGS: There is stable left-sided venous Port-A-Cath positioning. The heart size and mediastinal contours are within normal limits. Both lungs are clear. The visualized skeletal structures are unremarkable. IMPRESSION: No active cardiopulmonary disease. Electronically Signed   By: Virgina Norfolk M.D.   On: 11/24/2019 18:52   CT Abdomen Pelvis W Contrast  Result Date: 11/24/2019 CLINICAL DATA:  Abdominal distension, pain, nausea, vomiting. Pancreatic cancer EXAM: CT ABDOMEN AND PELVIS WITH CONTRAST TECHNIQUE: Multidetector CT imaging of the abdomen and pelvis was performed using the standard  protocol following bolus administration of intravenous contrast. CONTRAST:  112mL OMNIPAQUE IOHEXOL 300 MG/ML  SOLN COMPARISON:  11/04/2019 FINDINGS: Lower chest: Scarring at the left lung base.  No acute abnormality. Hepatobiliary: Numerous lesions throughout the liver, enlarging since prior study. Index right hepatic dome lesion measures 2.1 cm compared to 1.6 cm previously. Inferior right hepatic lesion measures 1.4 cm compared with 9 mm previously. Gallbladder unremarkable. Pancreas: Pancreatic head mass measuring up to 3.4 cm compared to 3.2 cm previously, likely not significantly changed. Pancreatic ductal dilatation in the body and tail. Spleen: No focal abnormality.  Normal size. Adrenals/Urinary Tract: No adrenal abnormality. No focal renal abnormality. No stones or hydronephrosis. Urinary bladder is unremarkable. Stomach/Bowel: Stomach, large and small bowel grossly unremarkable. Normal appendix. Vascular/Lymphatic: No evidence of aneurysm or adenopathy. Reproductive: No mass or abnormality noted. Other: No free fluid or free air. Musculoskeletal: No acute bony abnormality. IMPRESSION: Pancreatic head mass again noted compatible with patient's given history of pancreatic cancer. Numerous metastases throughout the liver, enlarging since prior study. Electronically Signed   By: Rolm Baptise M.D.   On: 11/24/2019 17:02        Scheduled Meds: . bisacodyl  10 mg Rectal Daily  . Chlorhexidine Gluconate Cloth  6 each Topical Daily  . dexamethasone (DECADRON) injection  4 mg Intravenous Q24H  . enoxaparin  120 mg Subcutaneous Q24H  . fluticasone  2 spray Each Nare Daily  . LORazepam  0.5 mg Intravenous Q8H  . metoprolol tartrate  5 mg Intravenous Q8H  . pantoprazole (PROTONIX) IV  40 mg Intravenous Q12H  . sodium chloride flush  3 mL Intravenous Q12H  . topiramate  50 mg Oral QHS   Continuous Infusions: . sodium chloride    . cefTRIAXone (ROCEPHIN)  IV Stopped (11/24/19 2106)     LOS: 0  days    Time spent: 35 minutes    Irine Seal, MD [facility]   To contact the attending provider between 7A-7P or the covering provider during after hours 7P-7A, please log into the web site [email or web address] and access using universal [place] password for that web site. If  you do not have the password, please call the hospital operator.  11/25/2019, 10:00 AM

## 2019-11-26 ENCOUNTER — Telehealth: Payer: Self-pay

## 2019-11-26 DIAGNOSIS — E876 Hypokalemia: Secondary | ICD-10-CM | POA: Diagnosis not present

## 2019-11-26 DIAGNOSIS — D6481 Anemia due to antineoplastic chemotherapy: Secondary | ICD-10-CM | POA: Diagnosis not present

## 2019-11-26 DIAGNOSIS — E440 Moderate protein-calorie malnutrition: Secondary | ICD-10-CM | POA: Insufficient documentation

## 2019-11-26 DIAGNOSIS — R112 Nausea with vomiting, unspecified: Secondary | ICD-10-CM | POA: Diagnosis not present

## 2019-11-26 DIAGNOSIS — I82422 Acute embolism and thrombosis of left iliac vein: Secondary | ICD-10-CM | POA: Diagnosis not present

## 2019-11-26 LAB — CBC WITH DIFFERENTIAL/PLATELET
Abs Immature Granulocytes: 0.02 10*3/uL (ref 0.00–0.07)
Basophils Absolute: 0 10*3/uL (ref 0.0–0.1)
Basophils Relative: 0 %
Eosinophils Absolute: 0 10*3/uL (ref 0.0–0.5)
Eosinophils Relative: 0 %
HCT: 23.1 % — ABNORMAL LOW (ref 36.0–46.0)
Hemoglobin: 7.6 g/dL — ABNORMAL LOW (ref 12.0–15.0)
Immature Granulocytes: 0 %
Lymphocytes Relative: 14 %
Lymphs Abs: 0.8 10*3/uL (ref 0.7–4.0)
MCH: 30.3 pg (ref 26.0–34.0)
MCHC: 32.9 g/dL (ref 30.0–36.0)
MCV: 92 fL (ref 80.0–100.0)
Monocytes Absolute: 0.2 10*3/uL (ref 0.1–1.0)
Monocytes Relative: 3 %
Neutro Abs: 4.3 10*3/uL (ref 1.7–7.7)
Neutrophils Relative %: 83 %
Platelets: 152 10*3/uL (ref 150–400)
RBC: 2.51 MIL/uL — ABNORMAL LOW (ref 3.87–5.11)
RDW: 15.4 % (ref 11.5–15.5)
WBC: 5.2 10*3/uL (ref 4.0–10.5)
nRBC: 0 % (ref 0.0–0.2)

## 2019-11-26 LAB — COMPREHENSIVE METABOLIC PANEL
ALT: 24 U/L (ref 0–44)
AST: 21 U/L (ref 15–41)
Albumin: 3 g/dL — ABNORMAL LOW (ref 3.5–5.0)
Alkaline Phosphatase: 87 U/L (ref 38–126)
Anion gap: 9 (ref 5–15)
BUN: 7 mg/dL (ref 6–20)
CO2: 23 mmol/L (ref 22–32)
Calcium: 9 mg/dL (ref 8.9–10.3)
Chloride: 104 mmol/L (ref 98–111)
Creatinine, Ser: 0.57 mg/dL (ref 0.44–1.00)
GFR, Estimated: >60 mL/min (ref >60)
Glucose, Bld: 98 mg/dL (ref 70–99)
Potassium: 3.7 mmol/L (ref 3.5–5.1)
Sodium: 136 mmol/L (ref 135–145)
Total Bilirubin: 0.7 mg/dL (ref 0.3–1.2)
Total Protein: 6.4 g/dL — ABNORMAL LOW (ref 6.5–8.1)

## 2019-11-26 LAB — GLUCOSE, CAPILLARY: Glucose-Capillary: 87 mg/dL (ref 70–99)

## 2019-11-26 LAB — MAGNESIUM: Magnesium: 1.9 mg/dL (ref 1.7–2.4)

## 2019-11-26 MED ORDER — POLYETHYLENE GLYCOL 3350 17 G PO PACK
17.0000 g | PACK | Freq: Every day | ORAL | Status: DC
  Administered 2019-11-26 – 2019-12-12 (×4): 17 g via ORAL
  Filled 2019-11-26 (×8): qty 1

## 2019-11-26 MED ORDER — OXYCODONE HCL 5 MG PO TABS
5.0000 mg | ORAL_TABLET | Freq: Four times a day (QID) | ORAL | Status: DC | PRN
  Administered 2019-12-01 – 2019-12-12 (×10): 5 mg via ORAL
  Filled 2019-11-26 (×11): qty 1

## 2019-11-26 MED ORDER — LORAZEPAM 0.5 MG PO TABS
0.5000 mg | ORAL_TABLET | Freq: Three times a day (TID) | ORAL | Status: DC | PRN
  Administered 2019-11-26: 0.5 mg via ORAL
  Filled 2019-11-26: qty 1

## 2019-11-26 MED ORDER — OXYCODONE HCL 5 MG PO TABS: 5.0000 mg | ORAL_TABLET | Freq: Four times a day (QID) | ORAL | Status: DC | PRN

## 2019-11-26 MED ORDER — SODIUM CHLORIDE 0.9 % IV SOLN: INTRAVENOUS | Status: DC

## 2019-11-26 MED ORDER — POTASSIUM CHLORIDE CRYS ER 20 MEQ PO TBCR
40.0000 meq | EXTENDED_RELEASE_TABLET | Freq: Once | ORAL | Status: DC
  Filled 2019-11-26: qty 2

## 2019-11-26 MED ORDER — SENNOSIDES-DOCUSATE SODIUM 8.6-50 MG PO TABS
1.0000 | ORAL_TABLET | Freq: Two times a day (BID) | ORAL | Status: DC
  Administered 2019-11-26 – 2019-12-08 (×10): 1 via ORAL
  Filled 2019-11-26 (×15): qty 1

## 2019-11-26 MED ORDER — ENSURE ENLIVE PO LIQD
237.0000 mL | Freq: Two times a day (BID) | ORAL | Status: DC
  Administered 2019-11-28 (×2): 237 mL via ORAL

## 2019-11-26 NOTE — Progress Notes (Signed)
Initial Nutrition Assessment  DOCUMENTATION CODES:   Non-severe (moderate) malnutrition in context of chronic illness  INTERVENTION:   -Ensure Enlive po BID, each supplement provides 350 kcal and 20 grams of protein (mixed with milk to lessen sweetness)  NUTRITION DIAGNOSIS:   Moderate Malnutrition related to chronic illness, cancer and cancer related treatments as evidenced by percent weight loss, energy intake < or equal to 75% for > or equal to 1 month.  GOAL:   Patient will meet greater than or equal to 90% of their needs  MONITOR:   PO intake, Supplement acceptance, Labs, Weight trends, I & O's  REASON FOR ASSESSMENT:   Malnutrition Screening Tool    ASSESSMENT:   59 year old unfortunate female history of recently diagnosed metastatic pancreatic adenocarcinoma with mets to the liver 09/19/2019, currently undergoing chemotherapy, recent chemotherapy 11/21/2019 presenting with 1 day history of worsening nausea, vomiting, abdominal pain.  Patient reports having N/V PTA following chemotherapy.  Currently tolerating clear liquids with no N/V. Diet was advanced to full liquids this morning and now is on a soft diet. Will resume Ensure supplements but will place instructions to have them diluted with plain milk to lessen the sweet taste. Pt does not like how sweet supplements are.   Pt with multiple food allergies as listed below: peanuts (does not consume any other nuts) -cinnamon, basil, oregano -garlic, tomatoes -oranges, grapefruit -potatoes -corn, corn oil (does not include corn syrup) -dried beans and green beans  Per weight records, pt has lost 31 lbs since 6/29 (14% wt loss x 4 months, significant for time frame). Pt meets criteria for moderate malnutrition given pt's inconsistent PO intakes given N/V over the past month d/t chemotherapy treatments and significant weight loss.  Medications: Miralax, KLOR-CON, Senokot  Labs reviewed: CBGs: 87-97  NUTRITION -  FOCUSED PHYSICAL EXAM:  No depletions noted.  Diet Order:   Diet Order            DIET SOFT Room service appropriate? Yes; Fluid consistency: Thin  Diet effective now                 EDUCATION NEEDS:   No education needs have been identified at this time  Skin:  Skin Assessment: Reviewed RN Assessment  Last BM:  10/31 -type 3  Height:   Ht Readings from Last 1 Encounters:  11/24/19 5\' 7"  (1.702 m)    Weight:   Wt Readings from Last 1 Encounters:  11/24/19 80.8 kg   BMI:  Body mass index is 27.9 kg/m.  Estimated Nutritional Needs:   Kcal:  2200-2400  Protein:  115-125g  Fluid:  2L/day  Clayton Bibles, MS, RD, LDN Inpatient Clinical Dietitian Contact information available via Amion

## 2019-11-26 NOTE — Telephone Encounter (Signed)
Called to see if interested in counseling again. Left message to call me back.  Gaylyn Rong Counseling Intern

## 2019-11-26 NOTE — Progress Notes (Addendum)
HEMATOLOGY-ONCOLOGY PROGRESS NOTE  SUBJECTIVE: Feels a lot better today.  Able to take in clear liquids without any recurrent nausea or vomiting.  She is not having any abdominal cramping this morning but states that her abdomen feels full.  Denies diarrhea.  Oncology History Overview Note  Cancer Staging Pancreatic cancer [facility]) Staging form: Exocrine Pancreas, AJCC 8th Edition - Clinical stage from 10/18/2019: Stage IV (cT3, cN1, cM1) - Signed by Truitt Merle, MD on 10/18/2019    Pancreatic cancer ([place])  09/19/2019 Imaging   CT AP w contrast 09/19/19  IMPRESSION: 1. Findings are highly concerning for probable primary pancreatic adenocarcinoma in the anterior aspect of the pancreatic head. Several prominent borderline enlarged lymph nodes are noted in the hepatoduodenal nodal station, and there are multiple indeterminate liver lesions which are highly concerning for probable hepatic metastases. Further evaluation with nonemergent abdominal MRI with and without IV gadolinium with MRCP is recommended in the near future to better evaluate these findings.   09/25/2019 Procedure   Upper EUS by Dr Paulita Fujita  IMPRESSION -There was no evidence of significant pathology in the left lobe of the liver.  -A few lymph nodes were visualized and measures in the peripancreatic region and porta hepata region.  -Hyperchoic material consistent with sludge was visualized endosonographically in the gallbladder.  -There was no sign of significant pathology in the common bile duct.  -A mass was identified in the pancreatic head. If biopsy results show adenocarcinoma, it would be staged T3N1Mx by endosonographic criteria. Fine needle aspiration performed.    09/25/2019 Initial Biopsy   FINAL MICROSCOPIC DIAGNOSIS: Fine needle aspirate, Pancreas;  MALIGNANT CELLS PRESENT CONSISTENT WITH ADENOCARCINOMA.    10/02/2019 Initial Diagnosis   Pancreatic cancer ([place])   10/11/2019 Procedure   PAC placement y Dr Barry Dienes     10/15/2019 Imaging   CT Chest  IMPRESSION: 1. Small anterior left pneumothorax with dependent atelectasis in the left lower lobe. 2. Increased number of bilateral axillary and subpectoral lymph nodes with mild lymphadenopathy in the left axilla. While this would be an atypical presentation for metastatic pancreatic cancer, this possibility is not excluded 3. Main duct dilatation in the pancreas, better assessed on abdomen CT 09/19/2019.   10/16/2019 Imaging   MRI Abdomen  IMPRESSION: 1. Substantially motion degraded scan. 2. Probable persistent small anterior left lung base pneumothorax, better seen on chest CT from 1 day prior. 3. Poorly marginated hypoenhancing 3.7 x 2.9 cm pancreatic head mass, which appears to invade the anterior peripancreatic fat, compatible with known pancreatic adenocarcinoma. Diffuse irregular dilatation of the main pancreatic duct in the pancreatic body and tail. Mild narrowing of the main portal vein by the mass. Abdominal vasculature remains patent and otherwise uninvolved. 4. Numerous (greater than 10) small liver masses scattered throughout the liver, largest 1.0 cm, which appear to demonstrate targetoid enhancement on the limited motion degraded postcontrast sequences, compatible with liver metastases. 5. Mild porta hepatis adenopathy, suspicious for metastatic disease.   10/18/2019 Cancer Staging   Staging form: Exocrine Pancreas, AJCC 8th Edition - Clinical stage from 10/18/2019: Stage IV (cT3, cN1, cM1) - Signed by Truitt Merle, MD on 10/18/2019   10/24/2019 -  Chemotherapy   First-line FOLFIRINOX q2weeks starting 10/24/19   10/30/2019 Pathology Results   FINAL MICROSCOPIC DIAGNOSIS:   A. LIVER, LESION, BIOPSY:  -  Metastatic carcinoma  -  See comment   COMMENT:   By immunohistochemistry, the neoplastic cells are positive for  cytokeratin 7 and GATA3 with patchy nonspecific staining for  PAX 8 but  negative for TTF-1, CDX2 and cytokeratin 20.   Overall, the findings are  consistent with metastasis of the patient's known breast carcinoma.  Prognostic panel (ER, PR, Her-2) is pending and will be reported in an  addendum.    ADDENDUM:   Dr. Laurence Ferrari notified us (November 01, 2019) that the patient was also  being worked up for a pancreatic mass.  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  In addition, ER and PR  are negative.  Gata-3 can be expressed in the pancreatic  adenocarcinomas; therefore, pancreatobiliary primary remains in the  differential.    11/02/2019 Genetic Testing   Negative genetic testing: no pathogenic variants detected in [facility] Common Hereditary Cancers Panel.  The report date is November 02, 2019.   The Common Hereditary Cancers Panel offered by [facility] includes sequencing and/or deletion duplication testing of the following 48 genes: APC, ATM, AXIN2, BARD1, BMPR1A, BRCA1, BRCA2, BRIP1, CDH1, CDK4, CDKN2A (p14ARF), CDKN2A (p16INK4a), CHEK2, CTNNA1, DICER1, EPCAM (Deletion/duplication testing only), GREM1 (promoter region deletion/duplication testing only), KIT, MEN1, MLH1, MSH2, MSH3, MSH6, MUTYH, NBN, NF1, NHTL1, PALB2, PDGFRA, PMS2, POLD1, POLE, PTEN, RAD50, RAD51C, RAD51D, RNF43, SDHB, SDHC, SDHD, SMAD4, SMARCA4. STK11, TP53, TSC1, TSC2, and VHL.  The following genes were evaluated for sequence changes only: SDHA and HOXB13 c.251G>A variant only.   11/04/2019 Imaging   CT AP  IMPRESSION: 1. Circumferential bowel wall thickening with adjacent fat stranding throughout the colon, most predominant in the transverse colon. This is consistent with colitis, which may be infectious or inflammatory in etiology. 2. Ill-defined pancreatic head mass consistent with known malignancy, similar to mildly increased in comparison to prior CT. There are innumerable hypodense masses throughout the liver, increased in conspicuity in comparison to prior CT. Findings are worrisome for  worsening metastatic disease. 3. Filling defect in the LEFT internal iliac vein, likely a thrombus with differential considerations including mixing artifact.      REVIEW OF SYSTEMS:   Constitutional: Denies fevers, chills  Respiratory: Denies cough, dyspnea or wheezes Cardiovascular: Denies palpitation, chest discomfort Gastrointestinal: Reports abdominal fullness this morning.  Denies nausea and vomiting.  Denies diarrhea.  Denies abdominal cramping. Skin: Denies abnormal skin rashes Lymphatics: Denies new lymphadenopathy or easy bruising Neurological:Denies numbness, tingling or new weaknesses Behavioral/Psych: Mood is stable, no new changes  Extremities: No lower extremity edema All other systems were reviewed with the patient and are negative.  I have reviewed the past medical history, past surgical history, social history and family history with the patient and they are unchanged from previous note.   PHYSICAL EXAMINATION: ECOG PERFORMANCE STATUS: 2 - Symptomatic, <50% confined to bed  Vitals:   11/25/19 1958 11/26/19 0607  BP: (!) 152/82 (!) 148/84  Pulse: 85 83  Resp: 16 14  Temp: 98.1 F (36.7 C) 98 F (36.7 C)  SpO2: 100% 100%   Filed Weights   11/24/19 1332 11/24/19 1348 11/24/19 1921  Weight: 79.4 kg 79.4 kg 80.8 kg    Intake/Output from previous day: 10/31 0701 - 11/01 0700 In: 1390 [I.V.:1290; IV Piggyback:100] Out: 800 [Urine:800]  GENERAL:alert, no distress and comfortable SKIN: skin color, texture, turgor are normal, no rashes or significant lesions LUNGS: clear to auscultation and percussion with normal breathing effort HEART: regular rate & rhythm and no murmurs and no lower extremity edema ABDOMEN: Positive bowel sounds, soft, nontender Musculoskeletal:no cyanosis of digits and no clubbing  NEURO: alert & oriented x 3 with fluent  speech, no focal motor/sensory deficits  LABORATORY DATA:  I have reviewed the data as listed CMP Latest Ref Rng &  Units 11/26/2019 11/25/2019 11/24/2019  Glucose 70 - 99 mg/dL 98 96 94  BUN 6 - 20 mg/dL '7 7 8  ' Creatinine 0.44 - 1.00 mg/dL 0.57 0.55 0.50  Sodium 135 - 145 mmol/L 136 139 136  Potassium 3.5 - 5.1 mmol/L 3.7 4.6 2.7(LL)  Chloride 98 - 111 mmol/L 104 106 97(L)  CO2 22 - 32 mmol/L '23 25 27  ' Calcium 8.9 - 10.3 mg/dL 9.0 8.8(L) 9.1  Total Protein 6.5 - 8.1 g/dL 6.4(L) 6.4(L) -  Total Bilirubin 0.3 - 1.2 mg/dL 0.7 1.0 -  Alkaline Phos 38 - 126 U/L 87 88 -  AST 15 - 41 U/L 21 24 -  ALT 0 - 44 U/L 24 27 -    Lab Results  Component Value Date   WBC 5.2 11/26/2019   HGB 7.6 (L) 11/26/2019   HCT 23.1 (L) 11/26/2019   MCV 92.0 11/26/2019   PLT 152 11/26/2019   NEUTROABS 4.3 11/26/2019    DG Chest 2 View  Result Date: 11/24/2019 CLINICAL DATA:  Nausea and vomiting. EXAM: CHEST - 2 VIEW COMPARISON:  November 04, 2019 FINDINGS: There is stable left-sided venous Port-A-Cath positioning. The heart size and mediastinal contours are within normal limits. Both lungs are clear. The visualized skeletal structures are unremarkable. IMPRESSION: No active cardiopulmonary disease. Electronically Signed   By: Virgina Norfolk M.D.   On: 11/24/2019 18:52   DG Chest 2 View  Result Date: 10/28/2019 CLINICAL DATA:  Elevated white count EXAM: CHEST - 2 VIEW COMPARISON:  None. FINDINGS: The heart size and mediastinal contours are within normal limits. Again noted is streaky airspace opacity at the left lung base. A left-sided MediPort catheter seen with the tip at the superior cavoatrial junction. The visualized skeletal structures are unremarkable. IMPRESSION: Unchanged since October 18, 2019, however new since October 11, 2019 streaky airspace opacity at the left lung base which could be due to subsegmental atelectasis and/or infectious etiology. Electronically Signed   By: Prudencio Pair M.D.   On: 10/28/2019 22:03   CT Abdomen Pelvis W Contrast  Result Date: 11/24/2019 CLINICAL DATA:  Abdominal  distension, pain, nausea, vomiting. Pancreatic cancer EXAM: CT ABDOMEN AND PELVIS WITH CONTRAST TECHNIQUE: Multidetector CT imaging of the abdomen and pelvis was performed using the standard protocol following bolus administration of intravenous contrast. CONTRAST:  162m OMNIPAQUE IOHEXOL 300 MG/ML  SOLN COMPARISON:  11/04/2019 FINDINGS: Lower chest: Scarring at the left lung base.  No acute abnormality. Hepatobiliary: Numerous lesions throughout the liver, enlarging since prior study. Index right hepatic dome lesion measures 2.1 cm compared to 1.6 cm previously. Inferior right hepatic lesion measures 1.4 cm compared with 9 mm previously. Gallbladder unremarkable. Pancreas: Pancreatic head mass measuring up to 3.4 cm compared to 3.2 cm previously, likely not significantly changed. Pancreatic ductal dilatation in the body and tail. Spleen: No focal abnormality.  Normal size. Adrenals/Urinary Tract: No adrenal abnormality. No focal renal abnormality. No stones or hydronephrosis. Urinary bladder is unremarkable. Stomach/Bowel: Stomach, large and small bowel grossly unremarkable. Normal appendix. Vascular/Lymphatic: No evidence of aneurysm or adenopathy. Reproductive: No mass or abnormality noted. Other: No free fluid or free air. Musculoskeletal: No acute bony abnormality. IMPRESSION: Pancreatic head mass again noted compatible with patient's given history of pancreatic cancer. Numerous metastases throughout the liver, enlarging since prior study. Electronically Signed   By: KRolm BaptiseM.D.  On: 11/24/2019 17:02   CT ABDOMEN PELVIS W CONTRAST  Result Date: 11/04/2019 CLINICAL DATA:  Nausea and vomiting, history of pancreatic cancer EXAM: CT ABDOMEN AND PELVIS WITH CONTRAST TECHNIQUE: Multidetector CT imaging of the abdomen and pelvis was performed using the standard protocol following bolus administration of intravenous contrast. CONTRAST:  18m OMNIPAQUE IOHEXOL 300 MG/ML  SOLN COMPARISON:  September 19, 2019.  FINDINGS: Lower chest: No acute abnormality.  LEFT basilar atelectasis. Hepatobiliary: There are innumerable hypodense masses throughout the liver, increased in conspicuity in comparison to prior study. Representative mass of the hepatic dome measures 16 mm, previously 7 mm (series 2, image 8). Newly conspicuous mass of the RIGHT inferior liver measures 12 mm (series 2, image 28). Gallbladder is unremarkable. There is mild central biliary ductal prominence. Pancreas: Ill-defined pancreatic mass of the pancreatic head measures approximately 3.2 x 1.9 by 3.3 cm, similar to mildly increased in comparison to prior. There is upstream pancreatic ductal dilation, unchanged in comparison to prior. This mass directly abuts the duodenum. There is adjacent fat stranding along the head of the pancreas, similar in comparison to prior. Spleen: Unremarkable. Adrenals/Urinary Tract: Adrenals are unremarkable. Kidneys enhance symmetrically. No hydronephrosis. Bladder is unremarkable. Stomach/Bowel: No evidence of bowel obstruction. There is circumferential bowel wall thickening with adjacent fat stranding throughout the colon. This is most predominant in the transverse colon. Appendix is unremarkable. Vascular/Lymphatic: Peripancreatic lymph node measures 9 mm, unchanged (series 2, image 28). There is a filling defect in the LEFT internal iliac vein. Minimal atherosclerotic calcifications. Aortic branches are patent. Reproductive: Status post hysterectomy. Other: No free air. Musculoskeletal: No acute or significant osseous findings. IMPRESSION: 1. Circumferential bowel wall thickening with adjacent fat stranding throughout the colon, most predominant in the transverse colon. This is consistent with colitis, which may be infectious or inflammatory in etiology. 2. Ill-defined pancreatic head mass consistent with known malignancy, similar to mildly increased in comparison to prior CT. There are innumerable hypodense masses throughout  the liver, increased in conspicuity in comparison to prior CT. Findings are worrisome for worsening metastatic disease. 3. Filling defect in the LEFT internal iliac vein, likely a thrombus with differential considerations including mixing artifact. Electronically Signed   By: SValentino SaxonMD   On: 11/04/2019 19:10   [place]OPSY (LIVER)  Result Date: 10/30/2019 INDICATION: 59year old female with a history of pancreatic cancer and small hepatic lesions concerning for metastatic disease. EXAM: ULTRASOUND BIOPSY CORE LIVER MEDICATIONS: None. ANESTHESIA/SEDATION: Moderate (conscious) sedation was employed during this procedure. A total of Versed 4 mg and Fentanyl 100 mcg was administered intravenously. Moderate Sedation Time: 14 minutes. The patient's level of consciousness and vital signs were monitored continuously by radiology nursing throughout the procedure under my direct supervision. FLUOROSCOPY TIME:  None. COMPLICATIONS: None immediate. PROCEDURE: Informed written consent was obtained from the patient after a thorough discussion of the procedural risks, benefits and alternatives. All questions were addressed. Maximal Sterile Barrier Technique was utilized including caps, mask, sterile gowns, sterile gloves, sterile drape, hand hygiene and skin antiseptic. A timeout was performed prior to the initiation of the procedure. Ultrasound was used to interrogate the liver. There are multiple small hypoechoic solid lesions scattered throughout the liver. A suitable lesion in the right hemi liver was identified. A skin entry site was selected and marked. The overlying skin was sterilely prepped and draped in the standard fashion using chlorhexidine skin prep. Local anesthesia was attained by infiltration with 1% lidocaine. A small dermatotomy was made. Under real-time ultrasound  guidance, a 55 gauge introducer needle was advanced through the liver and positioned at the margin of the lesion. Multiple 18 gauge core  biopsies were then obtained coaxially using the bio Pince automated biopsy device. Biopsy specimens were placed in formalin and delivered to pathology for further analysis. As the introducer needle was removed, the biopsy tract was embolized with a Gel-Foam slurry. Post biopsy ultrasound images demonstrate no evidence of hematoma or immediate complication. The patient tolerated the procedure well. IMPRESSION: Technically successful ultrasound-guided core biopsy of liver lesion. Signed, Criselda Peaches, MD, [facility] Radiology Electronically Signed   By: Jacqulynn Cadet M.D.   On: 10/30/2019 15:34   DG Chest Port 1 View  Result Date: 11/04/2019 CLINICAL DATA:  Nausea, vomiting and diarrhea since chemotherapy 2 weeks prior, history of pancreatic cancer EXAM: PORTABLE CHEST 1 VIEW COMPARISON:  Radiograph 10/28/2019 FINDINGS: Accessed left subclavian approach Port-A-Cath tip terminates at the superior cavoatrial junction in similar position to prior. Telemetry leads overlie the chest. Stable streaky opacities in the left lung base likely reflecting scarring or subsegmental atelectasis, less likely infection. No new consolidative opacity. No pneumothorax or effusion. The cardiomediastinal contours are unremarkable. No acute osseous or soft tissue abnormality. IMPRESSION: Stable streaky opacities in the left lung base likely reflecting scarring or subsegmental atelectasis, less likely infection. No new acute cardiopulmonary abnormality. Electronically Signed   By: Lovena Le M.D.   On: 11/04/2019 20:59   VAS [place] LOWER EXTREMITY VENOUS (DVT) ([facility] and [facility] 7a-7p)  Result Date: 11/05/2019  Lower Venous DVT Study Indications: CT Abdomen filling defect.  Risk Factors: Cancer. Comparison Study: No prior studies. Performing Technologist: Oliver Hum RVT  Examination Guidelines: A complete evaluation includes B-mode imaging, spectral Doppler, color Doppler,  and power Doppler as needed of all accessible portions of each vessel. Bilateral testing is considered an integral part of a complete examination. Limited examinations for reoccurring indications may be performed as noted. The reflux portion of the exam is performed with the patient in reverse Trendelenburg.  +-----+---------------+---------+-----------+----------+--------------+ RIGHTCompressibilityPhasicitySpontaneityPropertiesThrombus Aging +-----+---------------+---------+-----------+----------+--------------+ CFV  Full           Yes      Yes                                 +-----+---------------+---------+-----------+----------+--------------+   +---------+---------------+---------+-----------+----------+--------------+ LEFT     CompressibilityPhasicitySpontaneityPropertiesThrombus Aging +---------+---------------+---------+-----------+----------+--------------+ CFV      Full           Yes      Yes                                 +---------+---------------+---------+-----------+----------+--------------+ SFJ      Full                                                        +---------+---------------+---------+-----------+----------+--------------+ FV Prox  Full                                                        +---------+---------------+---------+-----------+----------+--------------+  FV Mid   Full                                                        +---------+---------------+---------+-----------+----------+--------------+ FV DistalFull                                                        +---------+---------------+---------+-----------+----------+--------------+ PFV      Full                                                        +---------+---------------+---------+-----------+----------+--------------+ POP      Full           Yes      Yes                                  +---------+---------------+---------+-----------+----------+--------------+ PTV      Full                                                        +---------+---------------+---------+-----------+----------+--------------+ PERO     Full                                                        +---------+---------------+---------+-----------+----------+--------------+ EIV                     Yes      Yes                                 +---------+---------------+---------+-----------+----------+--------------+ CIV                                                   Not visualized +---------+---------------+---------+-----------+----------+--------------+     Summary: RIGHT: - No evidence of common femoral vein obstruction.  LEFT: - There is no evidence of deep vein thrombosis in the lower extremity.  - No cystic structure found in the popliteal fossa.  *See table(s) above for measurements and observations. Electronically signed by Monica Martinez MD on 11/05/2019 at 5:21:58 PM.    Final     ASSESSMENT AND PLAN: 1.  Metastatic pancreatic adenocarcinoma 2.  Intractable nausea and vomiting secondary to chemotherapy, improved 3.  Dehydration, improved 4.  Anemia secondary to chemotherapy 5.  Left iliac vein DVT 6.  Lupus 7.  Hypertension 8.  Migraines 9.  Anxiety  -The patient received her last cycle of  chemotherapy on 11/21/2019.  Last cycle was dose reduced secondary to intractable nausea and vomiting following cycle #1.  Despite dose reduction, the patient still had intractable nausea and vomiting causing dehydration and hypokalemia requiring hospital admission.  Discussed with the patient that we can consider further dose reduction of her current chemotherapy regimen versus changing her to gemcitabine/Abraxane with future cycles.  Further recommendations per Dr. Burr Medico. -Nausea vomiting have resolved.  Tolerating clear liquid diet and recommend advancing diet as tolerated.   Continue dexamethasone, Zofran, lorazepam.  Discussed with patient how to take antiemetics at home.  We discussed that she may use lorazepam p.o. or sublingual if she develops vomiting and is unable to keep antiemetics down.  May need to also adjust antiemetic regimen with future chemotherapy. -Continue IV fluids per hospitalist.  Encourage p.o. intake. -Hemoglobin today is 7.6.  Recommend close monitoring and transfuse for hemoglobin less than 7.5. -Continue Lovenox for DVT. -Management of chronic medical conditions per hospitalist.   LOS: 1 day   Mikey Bussing, DNP, AGPCNP-BC, AOCNP 11/26/19  Addendum  I have seen the patient, examined her. I agree with the assessment and and plan and have edited the notes.   Mrs. Leano did feel better earlier today, and he tried lunch (salmon, rice and broccoli), she ate a few bites, and had nausea and vomiting.  She is quite fatigued with him after that.  Bowel movement is normal.  Will change her diet clear liquid, no solid food for the next few days.  Continue antiemetics and supportive care, including IV fluids.  I reviewed her CT scan from 2 days ago, which unfortunately showed disease progression in liver . She has been tolerating current regimen poorly, I plan to change her chemo regimen to gemcitabine and Abraxane, start with single agent gemcitabine first, when she recovers well from this episode.  Pt and her husband are in agreement with that.  If her repeated Hg<=7.5 tomorrow, please give one unit blood.   All questions answered.  I will follow-up as needed when she is in house and will schedule her f/u with me after discharge.   Truitt Merle  11/26/2019

## 2019-11-26 NOTE — Progress Notes (Signed)
PROGRESS NOTE    Cynthia Frye  DJS:[id number] DOB: 07/30/1960 DOA: 11/24/2019 PCP: Josetta Huddle, MD   Chief Complaint  Patient presents with  . Emesis    Brief Narrative:  Patient pleasant 59 year old unfortunate female history of recently diagnosed metastatic pancreatic adenocarcinoma with mets to the liver 09/19/2019, currently undergoing chemotherapy, recent chemotherapy 11/21/2019 presenting with 1 day history of worsening nausea, vomiting, abdominal pain.  Noted to have significant hypokalemia with inability to keep anything down.  CT abdomen and pelvis negative for small bowel obstruction.  Urinalysis concerning for UTI.  Patient started on IV antibiotics, scheduled IV antiemetics, IV fluids, potassium repletion.   Assessment & Plan:   Principal Problem:   Hypokalemia Active Problems:   Essential hypertension   Lupus (systemic lupus erythematosus) (HCC)   Seasonal allergies   Chronic migraine without aura without status migrainosus, not intractable   Pancreatic cancer (HCC)   Acute deep vein thrombosis of left iliac vein (HCC)   Nausea and vomiting in adult   Dehydration   Prolonged QT interval   Emesis   Tachycardia   Anemia associated with chemotherapy   Constipation   Generalized abdominal pain  1  Hypokalemia Secondary to GI losses from nausea and emesis postchemotherapy.  Magnesium level at 1.7 on admission.  Potassium was 2.7 on admission.  Potassium repleted and currently at 3.7..  Status post 6 rounds of KCl 10 mEq IV.  Potassium has been discontinued from IV fluids.  Magnesium at 1.9.  K. Dur 40 mEq p.o. x1.  Saline lock IV fluids.  Supportive care.  Follow.   2.  Nausea and vomiting Likely chemotherapy induced.  Patient with recent chemotherapy 11/21/2019.  Patient noted to have had similar episodes recently after first cycle of chemotherapy with nausea vomiting and diarrhea and had to be hospitalized from 11/04/2019-11/09/2019 in addition to colitis.     Clinical improvement on scheduled IV Ativan, IV Decadron, as needed Zofran.  Tolerating clear liquids.  Due to QTC prolongation patient was placed on scheduled IV Ativan for nausea and vomiting.  Discontinue scheduled IV Ativan and placed on oral Ativan every 8 hours as needed.  Repeat EKG with resolution of QTC prolongation.  Chest x-ray negative for any acute infiltrate.  Saline lock IV fluids.  Advance to a full liquid diet and if tolerates will advance to a soft diet for supper.  Supportive care.  Follow.   3.  Dehydration On normal saline at 75 cc/h.  Tolerating clears.  Advance to full liquid diet.  Saline lock IV fluids.  4.  Tachycardia Likely secondary to volume depletion, nausea vomiting, possible beta-blockade withdrawal as patient unable to keep medications down.  Tachycardia improved.  Continue hydration with IV fluids.  Continue current regimen of IV Lopressor when tolerating oral intake could likely transition back to home regimen of Toprol-XL.  Pain management.  Antiemetics.  Supportive care.   5.  Prolonged QT interval Repeat EKG with resolution of QTC prolongation.  Keep magnesium >2, potassium >4.  Avoid QTC prolongation medications.  Supportive care.  Follow.  6.  Pancreatic adenocarcinoma in the head, with multiple liver lesions likely liver metastases Recently diagnosed per CT scan on 09/19/2019, EUS biopsy per Dr. Paulita Fujita 09/25/2019 consistent with pancreatic adenocarcinoma.  Patient status post cycle 1 FOLFIRINOX 10/24/2019.  Patient stated started cycle 2 of chemotherapy 11/21/2019.  Patient noted per med rec to be on Decadron 4 mg daily after chemotherapy for 3 to 5 days and as such due to  nausea and vomiting patient started on Decadron 4 mg IV daily x3 days.  Oncology, Dr. Burr Medico informed via epic of patient's admission.  Continue current pain management.  Resume home regimen oxycodone as needed.  Supportive care.   7.  Recent left leg vein DVT Continue full dose Lovenox.      8.  Upper abdominal cramps Likely secondary to metastatic pancreatic adenocarcinoma.    Patient also with some complaints of constipation.  Patient noted to be on oral oxycodone at home for her lupus prior to admission however due to ongoing nausea and vomiting patient placed on IV Dilaudid as needed.  Pain currently controlled on current regimen of IV Dilaudid.  Continue IV PPI twice daily.  When tolerating oral intake will transition to oral PPI.  Resume home regimen oxycodone as needed.  Follow.   21.  Lupus Being followed in the outpatient setting by rheumatologist, Dr. Trudie Reed on weekly methotrexate injections, Plaquenil and diclofenac.  Outpatient follow-up with rheumatology.  10.  Probable UTI Urinalysis with moderate leukocytes, nitrite negative, rare bacteria, 21-50 WBCs.  Urine cultures pending.  Patient with metastatic pancreatic cancer on chemotherapy and as such patient started on IV Rocephin.  Urine cultures pending.  Continue IV Rocephin  D3/3.  Follow.   11.  Anxiety Currently controlled on scheduled Ativan for nausea and emesis.  We will discontinue scheduled IV Ativan and placed on Ativan 0.5 mg p.o. every 8 hours as needed.  Supportive care.  Follow.   12.  Hypertension Continue IV Lopressor.  Once tolerating oral intake will transition back to home regimen of oral antihypertensive medications.  Follow.   13.  Constipation Patient with large bowel movement RN yesterday evening after soapsuds enema was given.  Discontinue Dulcolax suppositories.  Placed on a bowel regimen of MiraLAX daily, Senokot-S twice daily.  Outpatient follow-up.   14.  Anemia Likely chemo induced.  Patient denies any overt bleeding.  Consistent with anemia of chronic disease.  Hemoglobin stable at 7.5.  Transfusion threshold hemoglobin < 7.  Follow H&H.     DVT prophylaxis: Lovenox Code Status: Full Family Communication: Updated patient.  No family at bedside. Disposition:   Status is:  Observation    Dispo: The patient is from: Home              Anticipated d/c is to: Home              Anticipated d/c date is: 11/27/2019              Patient currently on IV antibiotics, IV antiemetics, IV fluids, not stable for discharge.       Consultants:   Oncology: Dr. Jana Hakim 11/25/2019  Procedures:   Chest x-ray 11/24/2019  CT abdomen and pelvis 11/24/2019  Antimicrobials:   IV Rocephin 11/24/2019   Subjective: Sitting up in bed on the telephone.  Denies chest pain or shortness of breath.  States abdominal pain has improved.  Tolerating clear liquids.  Stated had a large bowel movement after enema was given yesterday.   Objective: Vitals:   11/25/19 0813 11/25/19 1420 11/25/19 1958 11/26/19 0607  BP: (!) 141/87 137/83 (!) 152/82 (!) 148/84  Pulse: 82 90 85 83  Resp: 16 16 16 14   Temp: 98.6 F (37 C) 98.7 F (37.1 C) 98.1 F (36.7 C) 98 F (36.7 C)  TempSrc: Oral Oral Oral Oral  SpO2: 100% 99% 100% 100%  Weight:      Height:  Intake/Output Summary (Last 24 hours) at 11/26/2019 0911 Last data filed at 11/26/2019 0312 Gross per 24 hour  Intake 1390 ml  Output 800 ml  Net 590 ml   Filed Weights   11/24/19 1332 11/24/19 1348 11/24/19 1921  Weight: 79.4 kg 79.4 kg 80.8 kg    Examination:  General exam: NAD Respiratory system: CTAB.  No wheezes, no crackles, no rhonchi.  Normal respiratory effort. Cardiovascular system: Regular rate rhythm no murmurs rubs or gallops.  No JVD.  No lower extremity edema.  Gastrointestinal system: Abdomen is soft, nontender, nondistended, positive bowel sounds.  Some pressure to palpation in the lower abdomen.  No rebound.  No guarding.  Central nervous system: Alert and oriented. No focal neurological deficits. Extremities: Symmetric 5 x 5 power. Skin: No rashes, lesions or ulcers Psychiatry: Judgement and insight appear normal. Mood & affect appropriate.     Data Reviewed: I have personally reviewed  following labs and imaging studies  CBC: Recent Labs  Lab 11/21/19 0934 11/24/19 1427 11/25/19 0626 11/26/19 0624  WBC 4.6 5.6 6.2 5.2  NEUTROABS 3.0 4.7  --  4.3  HGB 8.8* 8.2* 7.5* 7.6*  HCT 26.4* 25.0* 23.5* 23.1*  MCV 89.2 90.9 93.3 92.0  PLT 253 160 151 858    Basic Metabolic Panel: Recent Labs  Lab 11/21/19 0934 11/24/19 1427 11/24/19 1504 11/25/19 0626 11/26/19 0624  NA 139 136  --  139 136  K 3.2* 2.7*  --  4.6 3.7  CL 101 97*  --  106 104  CO2 27 27  --  25 23  GLUCOSE 87 94  --  96 98  BUN 7 8  --  7 7  CREATININE 0.65 0.50  --  0.55 0.57  CALCIUM 9.7 9.1  --  8.8* 9.0  MG  --   --  1.7 2.3 1.9  PHOS  --   --   --  3.1  --     GFR: Estimated Creatinine Clearance: 83.9 mL/min (by C-G formula based on SCr of 0.57 mg/dL).  Liver Function Tests: Recent Labs  Lab 11/21/19 0934 11/25/19 0626 11/26/19 0624  AST 41 24 21  ALT 34 27 24  ALKPHOS 124 88 87  BILITOT 0.6 1.0 0.7  PROT 7.1 6.4* 6.4*  ALBUMIN 3.5 3.0* 3.0*    CBG: Recent Labs  Lab 11/25/19 0811 11/26/19 0707  GLUCAP 97 87     Recent Results (from the past 240 hour(s))  Respiratory Panel by RT PCR (Flu A&B, Covid) - Nasopharyngeal Swab     Status: None   Collection Time: 11/24/19  3:34 PM   Specimen: Nasopharyngeal Swab  Result Value Ref Range Status   SARS Coronavirus 2 by RT PCR NEGATIVE NEGATIVE Final    Comment: (NOTE) SARS-CoV-2 target nucleic acids are NOT DETECTED.  The SARS-CoV-2 RNA is generally detectable in upper respiratoy specimens during the acute phase of infection. The lowest concentration of SARS-CoV-2 viral copies this assay can detect is 131 copies/mL. A negative result does not preclude SARS-Cov-2 infection and should not be used as the sole basis for treatment or other patient management decisions. A negative result may occur with  improper specimen collection/handling, submission of specimen other than nasopharyngeal swab, presence of viral mutation(s)  within the areas targeted by this assay, and inadequate number of viral copies (<131 copies/mL). A negative result must be combined with clinical observations, patient history, and epidemiological information. The expected result is Negative.  Fact Sheet for Patients:  PinkCheek.be  Fact Sheet for Healthcare Providers:  GravelBags.it  This test is no t yet approved or cleared by the [place] FDA and  has been authorized for detection and/or diagnosis of SARS-CoV-2 by FDA under an Emergency Use Authorization (EUA). This EUA will remain  in effect (meaning this test can be used) for the duration of the COVID-19 declaration under Section 564(b)(1) of the Act, 21 U.S.C. section 360bbb-3(b)(1), unless the authorization is terminated or revoked sooner.     Influenza A by PCR NEGATIVE NEGATIVE Final   Influenza B by PCR NEGATIVE NEGATIVE Final    Comment: (NOTE) The Xpert Xpress SARS-CoV-2/FLU/RSV assay is intended as an aid in  the diagnosis of influenza from Nasopharyngeal swab specimens and  should not be used as a sole basis for treatment. Nasal washings and  aspirates are unacceptable for Xpert Xpress SARS-CoV-2/FLU/RSV  testing.  Fact Sheet for Patients: PinkCheek.be  Fact Sheet for Healthcare Providers: GravelBags.it  This test is not yet approved or cleared by the [place] FDA and  has been authorized for detection and/or diagnosis of SARS-CoV-2 by  FDA under an Emergency Use Authorization (EUA). This EUA will remain  in effect (meaning this test can be used) for the duration of the  Covid-19 declaration under Section 564(b)(1) of the Act, 21  U.S.C. section 360bbb-3(b)(1), unless the authorization is  terminated or revoked. Performed at [facility], [place] [address], [place], [place] [address]          Radiology  Studies: DG Chest 2 View  Result Date: 11/24/2019 CLINICAL DATA:  Nausea and vomiting. EXAM: CHEST - 2 VIEW COMPARISON:  November 04, 2019 FINDINGS: There is stable left-sided venous Port-A-Cath positioning. The heart size and mediastinal contours are within normal limits. Both lungs are clear. The visualized skeletal structures are unremarkable. IMPRESSION: No active cardiopulmonary disease. Electronically Signed   By: Virgina Norfolk M.D.   On: 11/24/2019 18:52   CT Abdomen Pelvis W Contrast  Result Date: 11/24/2019 CLINICAL DATA:  Abdominal distension, pain, nausea, vomiting. Pancreatic cancer EXAM: CT ABDOMEN AND PELVIS WITH CONTRAST TECHNIQUE: Multidetector CT imaging of the abdomen and pelvis was performed using the standard protocol following bolus administration of intravenous contrast. CONTRAST:  179mL OMNIPAQUE IOHEXOL 300 MG/ML  SOLN COMPARISON:  11/04/2019 FINDINGS: Lower chest: Scarring at the left lung base.  No acute abnormality. Hepatobiliary: Numerous lesions throughout the liver, enlarging since prior study. Index right hepatic dome lesion measures 2.1 cm compared to 1.6 cm previously. Inferior right hepatic lesion measures 1.4 cm compared with 9 mm previously. Gallbladder unremarkable. Pancreas: Pancreatic head mass measuring up to 3.4 cm compared to 3.2 cm previously, likely not significantly changed. Pancreatic ductal dilatation in the body and tail. Spleen: No focal abnormality.  Normal size. Adrenals/Urinary Tract: No adrenal abnormality. No focal renal abnormality. No stones or hydronephrosis. Urinary bladder is unremarkable. Stomach/Bowel: Stomach, large and small bowel grossly unremarkable. Normal appendix. Vascular/Lymphatic: No evidence of aneurysm or adenopathy. Reproductive: No mass or abnormality noted. Other: No free fluid or free air. Musculoskeletal: No acute bony abnormality. IMPRESSION: Pancreatic head mass again noted compatible with patient's given history of  pancreatic cancer. Numerous metastases throughout the liver, enlarging since prior study. Electronically Signed   By: Rolm Baptise M.D.   On: 11/24/2019 17:02        Scheduled Meds: . Chlorhexidine Gluconate Cloth  6 each Topical Daily  . dexamethasone (DECADRON) injection  4 mg Intravenous Q24H  . enoxaparin  120 mg Subcutaneous Q24H  . fluticasone  2 spray Each Nare Daily  . metoprolol tartrate  5 mg Intravenous Q8H  . pantoprazole (PROTONIX) IV  40 mg Intravenous Q12H  . polyethylene glycol  17 g Oral Daily  . senna-docusate  1 tablet Oral BID  . sodium chloride flush  3 mL Intravenous Q12H  . topiramate  50 mg Oral QHS   Continuous Infusions: . sodium chloride 75 mL/hr at 11/25/19 1000  . cefTRIAXone (ROCEPHIN)  IV Stopped (11/25/19 2039)     LOS: 1 day    Time spent: 35 minutes    Irine Seal, MD [facility]   To contact the attending provider between 7A-7P or the covering provider during after hours 7P-7A, please log into the web site [email or web address] and access using universal  password for that web site. If you do not have the password, please call the hospital operator.  11/26/2019, 9:11 AM

## 2019-11-26 NOTE — Progress Notes (Signed)
PT Cancellation Note  Patient Details Name: Cynthia Frye MRN: [id number] DOB: Jan 19, 1961   Cancelled Treatment:    Reason Eval/Treat Not Completed: Medical issues which prohibited therapy (Attempted 2x, pt c/o nausea and with multiple bouts of emesis despite medication. Will hold and follow up at later date/time as pt able and schedule allows.)   Verner Mould, [place]  Office [phone] Pager [phone]  11/26/2019 6:24 PM

## 2019-11-27 DIAGNOSIS — I82422 Acute embolism and thrombosis of left iliac vein: Secondary | ICD-10-CM | POA: Diagnosis not present

## 2019-11-27 DIAGNOSIS — R112 Nausea with vomiting, unspecified: Secondary | ICD-10-CM | POA: Diagnosis not present

## 2019-11-27 DIAGNOSIS — E876 Hypokalemia: Secondary | ICD-10-CM | POA: Diagnosis not present

## 2019-11-27 DIAGNOSIS — D6481 Anemia due to antineoplastic chemotherapy: Secondary | ICD-10-CM | POA: Diagnosis not present

## 2019-11-27 LAB — BASIC METABOLIC PANEL
Anion gap: 10 (ref 5–15)
BUN: 7 mg/dL (ref 6–20)
CO2: 23 mmol/L (ref 22–32)
Calcium: 8.9 mg/dL (ref 8.9–10.3)
Chloride: 103 mmol/L (ref 98–111)
Creatinine, Ser: 0.62 mg/dL (ref 0.44–1.00)
GFR, Estimated: >60 mL/min (ref >60)
Glucose, Bld: 99 mg/dL (ref 70–99)
Potassium: 3.7 mmol/L (ref 3.5–5.1)
Sodium: 136 mmol/L (ref 135–145)

## 2019-11-27 LAB — CBC WITH DIFFERENTIAL/PLATELET
Abs Immature Granulocytes: 0.03 10*3/uL (ref 0.00–0.07)
Basophils Absolute: 0 10*3/uL (ref 0.0–0.1)
Basophils Relative: 0 %
Eosinophils Absolute: 0 10*3/uL (ref 0.0–0.5)
Eosinophils Relative: 0 %
HCT: 22.8 % — ABNORMAL LOW (ref 36.0–46.0)
Hemoglobin: 7.8 g/dL — ABNORMAL LOW (ref 12.0–15.0)
Immature Granulocytes: 1 %
Lymphocytes Relative: 14 %
Lymphs Abs: 0.7 10*3/uL (ref 0.7–4.0)
MCH: 30.4 pg (ref 26.0–34.0)
MCHC: 34.2 g/dL (ref 30.0–36.0)
MCV: 88.7 fL (ref 80.0–100.0)
Monocytes Absolute: 0.2 10*3/uL (ref 0.1–1.0)
Monocytes Relative: 3 %
Neutro Abs: 4.1 10*3/uL (ref 1.7–7.7)
Neutrophils Relative %: 82 %
Platelets: 176 10*3/uL (ref 150–400)
RBC: 2.57 MIL/uL — ABNORMAL LOW (ref 3.87–5.11)
RDW: 14.9 % (ref 11.5–15.5)
WBC: 5 10*3/uL (ref 4.0–10.5)
nRBC: 0 % (ref 0.0–0.2)

## 2019-11-27 LAB — GLUCOSE, CAPILLARY: Glucose-Capillary: 99 mg/dL (ref 70–99)

## 2019-11-27 LAB — MAGNESIUM: Magnesium: 1.7 mg/dL (ref 1.7–2.4)

## 2019-11-27 MED ORDER — PANTOPRAZOLE SODIUM 40 MG IV SOLR
40.0000 mg | INTRAVENOUS | Status: DC
  Administered 2019-11-28 – 2019-12-05 (×8): 40 mg via INTRAVENOUS
  Filled 2019-11-27 (×8): qty 40

## 2019-11-27 MED ORDER — LORAZEPAM 2 MG/ML IJ SOLN: 0.5000 mg | Freq: Three times a day (TID) | INTRAMUSCULAR | Status: DC

## 2019-11-27 MED ORDER — MAGNESIUM SULFATE 4 GM/100ML IV SOLN
4.0000 g | Freq: Once | INTRAVENOUS | Status: AC
  Administered 2019-11-27: 4 g via INTRAVENOUS
  Filled 2019-11-27: qty 100

## 2019-11-27 MED ORDER — POTASSIUM CHLORIDE 10 MEQ/100ML IV SOLN
10.0000 meq | INTRAVENOUS | Status: AC
  Administered 2019-11-27 (×4): 10 meq via INTRAVENOUS
  Filled 2019-11-27 (×3): qty 100

## 2019-11-27 MED ORDER — LORAZEPAM 2 MG/ML IJ SOLN
0.5000 mg | Freq: Three times a day (TID) | INTRAMUSCULAR | Status: AC
  Administered 2019-11-27 – 2019-11-28 (×5): 0.5 mg via INTRAVENOUS
  Filled 2019-11-27 (×5): qty 1

## 2019-11-27 NOTE — Progress Notes (Signed)
PT Cancellation Note  Patient Details Name: Cynthia Frye MRN: [id number] DOB: Jun 12, 1960   Cancelled Treatment:     am cancel due to nausea Pm cancel "just vomited"  Pt has been evaluated with rec for no PT f/u post acute hospital stay.  RN stated she would walk pt later is pt felt able.   Rica Koyanagi  PTA Acute  Rehabilitation Services Pager      [phone] Office      [phone]

## 2019-11-27 NOTE — Progress Notes (Signed)
Occupational Therapy Treatment Patient Details Name: Cynthia Frye MRN: [id number] DOB: [birth year] Today's Date: 11/27/2019    History of present illness 59 year old unfortunate female history of recently diagnosed metastatic pancreatic adenocarcinoma with mets to the liver 09/19/2019, currently undergoing chemotherapy, recent chemotherapy 11/21/2019 presenting with 1 day history of worsening nausea, vomiting, abdominal pain.  Noted to have significant hypokalemia, admitted to [facility]   OT comments  Patient progressing and able to participate a little today before becoming too nauseated with vomiting to continue functional mobility, compared to previous session. Patient limited by N/V, generalized fatigue and weakness, along with deficits noted below. Pt continues to demonstrate good rehab potential and would benefit from continued skilled OT to increase safety and independence with ADLs and functional transfers to allow pt to return home safely and reduce caregiver burden and fall risk.   Follow Up Recommendations  Supervision/Assistance - 24 hour    Equipment Recommendations  None recommended by OT    Recommendations for Other Services      Precautions / Restrictions Precautions Precautions: Fall Restrictions Weight Bearing Restrictions: No       Mobility Bed Mobility Overal bed mobility: Modified Independent Bed Mobility: Supine to Sit     Supine to sit: Modified independent (Device/Increase time)        Transfers Overall transfer level: Needs assistance Equipment used: None Transfers: Sit to/from [facility] Sit to Stand: Supervision         General transfer comment: Pt stood from EOB Mod I. Ambulated in room with supervision for ~30', then had to stop and sit in recliner due to nausea/vomiting.  RN and MD into room and aware.    Balance Overall balance assessment: Needs assistance   Sitting balance-Leahy Scale: Good       Standing balance-Leahy  Scale: Fair                             ADL either performed or assessed with clinical judgement   ADL     Eating/Feeding Details (indicate cue type and reason): Declined due to "stomach bubbling". Grooming: Sitting;Wash/dry face;Set up                               Functional mobility during ADLs: Supervision/safety General ADL Comments: Sat for grooming due to nausea/vomiting after ambulation in room.     Vision   Vision Assessment?: No apparent visual deficits   Perception     Praxis      Cognition Arousal/Alertness: Awake/alert Behavior During Therapy: WFL for tasks assessed/performed;Flat affect Overall Cognitive Status: Within Functional Limits for tasks assessed                                          Exercises     Shoulder Instructions       General Comments Walking rather stooped over, possibly due to nausea. Cues for posture.    Pertinent Vitals/ Pain       Pain Assessment: No/denies pain  Home Living                                          Prior Functioning/Environment  Frequency  Min 2X/week        Progress Toward Goals  OT Goals(current goals can now be found in the care plan section)  Progress towards OT goals: Progressing toward goals  Acute Rehab OT Goals Patient Stated Goal: Be able to tolerate eating without nausea/vomiting in order to get stronger. OT Goal Formulation: With patient Time For Goal Achievement: 12/09/19  Plan Discharge plan remains appropriate    Co-evaluation                 AM-PAC OT "6 Clicks" Daily Activity     Outcome Measure   Help from another person eating meals?: None Help from another person taking care of personal grooming?: None Help from another person toileting, which includes using toliet, bedpan, or urinal?: A Little Help from another person bathing (including washing, rinsing, drying)?: A Little Help from  another person to put on and taking off regular upper body clothing?: None Help from another person to put on and taking off regular lower body clothing?: A Little 6 Click Score: 21    End of Session Equipment Utilized During Treatment: Gait belt  OT Visit Diagnosis: Muscle weakness (generalized) (M62.81)   Activity Tolerance Treatment limited secondary to medical complications (Comment) (Nausea/vomiting.)   Patient Left in chair (MD in room)   Nurse Communication  (Vomiting)        Time: 0355-9741 OT Time Calculation (min): 12 min  Charges: OT General Charges $OT Visit: 1 Visit OT Treatments $Therapeutic Activity: 8-22 mins  Anderson Malta, OT Acute Rehab Services Office: [phone] 11/27/2019    Julien Girt 11/27/2019, 10:17 AM

## 2019-11-27 NOTE — Progress Notes (Signed)
PROGRESS NOTE    Cynthia Frye  ZGY:[id number] DOB: 10-May-1960 DOA: 11/24/2019 PCP: Josetta Huddle, MD   Chief Complaint  Patient presents with  . Emesis    Brief Narrative:  Patient pleasant 59 year old unfortunate female history of recently diagnosed metastatic pancreatic adenocarcinoma with mets to the liver 09/19/2019, currently undergoing chemotherapy, recent chemotherapy 11/21/2019 presenting with 1 day history of worsening nausea, vomiting, abdominal pain.  Noted to have significant hypokalemia with inability to keep anything down.  CT abdomen and pelvis negative for small bowel obstruction.  Urinalysis concerning for UTI.  Patient started on IV antibiotics, scheduled IV antiemetics, IV fluids, potassium repletion.   Assessment & Plan:   Principal Problem:   Hypokalemia Active Problems:   Essential hypertension   Lupus (systemic lupus erythematosus) (HCC)   Seasonal allergies   Chronic migraine without aura without status migrainosus, not intractable   Pancreatic cancer (HCC)   Acute deep vein thrombosis of left iliac vein (HCC)   Nausea and vomiting in adult   Dehydration   Prolonged QT interval   Emesis   Tachycardia   Anemia associated with chemotherapy   Constipation   Generalized abdominal pain   Malnutrition of moderate degree  1  Hypokalemia Secondary to GI losses from nausea and emesis postchemotherapy.  Magnesium level at 1.7 on admission.  Potassium was 2.7 on admission.  Potassium repleted and currently at 3.7.  Patient status post 6 rounds of KCl 10 mEq IV on admission.  We will give 3 more rounds of KCl 10 mEq IV to keep potassium >4.  Magnesium at 1.7.  We will give magnesium sulfate 4 g IV to keep magnesium >2.  Continue hydration with IV fluids.  Supportive care.  2.  Nausea and vomiting Likely chemotherapy induced.  Patient with recent chemotherapy 11/21/2019.  Patient noted to have had similar episodes recently after first cycle of chemotherapy with  nausea vomiting and diarrhea and had to be hospitalized from 11/04/2019-11/09/2019 in addition to colitis.    Patient on admission initially placed on scheduled IV Ativan, IV Decadron, Zofran as needed with improvement with nausea and emesis.  Scheduled IV Ativan was discontinued and patient's diet was advanced from clears to full liquids/soft diet however after 2 bites patient had repeated episodes of nausea and emesis and as such diet has been downgraded back to clear liquids for the next few days.  Will place back on scheduled Ativan x48 hours.  Continue IV Decadron.  Continue as needed Zofran.  Continue IV fluids.  Will likely maintain on clear liquids for the next few days.  Per oncology future chemo doses will be decreased/adjusted.  Supportive care.   3.  Dehydration Continue IV fluids and normal saline 100 cc/h.  Currently on clear liquids.  Follow.   4.  Tachycardia Likely secondary to volume depletion, nausea vomiting, possible beta-blockade withdrawal as patient unable to keep medications down.  Tachycardia has improved.  Continue hydration with IV fluids.  Continue current dose of IV Lopressor.  Pain management.  Antiemetics.  Supportive care.  5.  Prolonged QT interval Repeat EKG with resolution of QTC prolongation.  Keep magnesium >2, potassium >4.  Avoid QTC prolongation medications.  Supportive care.  Follow.  6.  Pancreatic adenocarcinoma in the head, with multiple liver lesions likely liver metastases Recently diagnosed per CT scan on 09/19/2019, EUS biopsy per Dr. Paulita Fujita 09/25/2019 consistent with pancreatic adenocarcinoma.  Patient status post cycle 1 FOLFIRINOX 10/24/2019.  Patient stated started cycle 2 of chemotherapy 11/21/2019.  Patient noted per med rec to be on Decadron 4 mg daily after chemotherapy for 3 to 5 days and as such due to nausea and vomiting patient started on Decadron 4 mg IV daily x3 days.  Oncology, Dr. Burr Medico has assessed the patient and is currently following.   Continue current pain regimen.  Per oncology.    7.  Recent left leg vein DVT Full dose Lovenox.    8.  Upper abdominal cramps Likely secondary to metastatic pancreatic adenocarcinoma.    Patient also with some complaints of constipation.  Patient noted to be on oral oxycodone at home for her lupus prior to admission however due to ongoing nausea and vomiting patient placed on IV Dilaudid as needed.  Pain currently controlled on current regimen of IV Dilaudid.  Change oral PPI back to IV PPI due to ongoing emesis.  When tolerating oral intake could likely resume home dose oral oxycodone as needed.  Follow.   46.  Lupus Being followed in the outpatient setting by rheumatologist, Dr. Trudie Reed on weekly methotrexate injections, Plaquenil and diclofenac.  Outpatient follow-up with rheumatology.  10.  Probable UTI Urinalysis with moderate leukocytes, nitrite negative, rare bacteria, 21-50 WBCs.  Urine cultures pending.  Patient with metastatic pancreatic cancer on chemotherapy and as such patient started on IV Rocephin.  Urine culture still pending.  Status post 3 days IV Rocephin.  DC antibiotics.  No further antibiotics needed.    11.  Anxiety Resume scheduled IV Ativan due to nausea and emesis.  Once tolerating oral intake could likely transition to oral Ativan as needed.  Supportive care.   12.  Hypertension Patient still with nausea and emesis.  Continue IV Lopressor.  Once tolerating oral intake could transition back to home regimen oral antihypertensive medications.   13.  Constipation Patient with large bowel movement RN after soapsuds enema was given.  Patient currently on a bowel regimen of MiraLAX and Senokot-S twice daily if able to tolerate oral intake.  Dulcolax suppositories discontinued.  Outpatient follow-up.    14.  Anemia of chronic disease/chemotherapy-induced anemia Likely chemo induced.  Patient denies any overt bleeding.  Consistent with anemia of chronic disease.   Hemoglobin currently at 7.8.  Per hematology transfusion threshold hemoglobin < 7.5.    DVT prophylaxis: Lovenox Code Status: Full Family Communication: Updated patient.  No family at bedside. Disposition:   Status is: Inpatient    Dispo: The patient is from: Home              Anticipated d/c is to: Home              Anticipated d/c date is: 3 to 4 days.              Patient currently on IV fluids, IV antiemetics.  Patient with ongoing nausea and emesis.  Not stable for discharge.         Consultants:   Oncology: Dr. Jana Hakim 11/25/2019  Procedures:   Chest x-ray 11/24/2019  CT abdomen and pelvis 11/24/2019  Antimicrobials:   IV Rocephin 11/24/2019>>>> 11/27/2019   Subjective: Patient sitting up in chair with emesis bag).  Patient was just working with physical therapy and noted to have a bout of emesis.  Patient noted to have emesis yesterday evening after trying to bites of food as well as taking oral medications.  Patient denies any chest pain.  Complaining that his stomach is bubbling/some discomfort.  Currently on clears.    Objective: Vitals:   11/26/19  8676 11/26/19 1549 11/26/19 2142 11/27/19 0531  BP: (!) 148/84 (!) 157/80 131/83 133/90  Pulse: 83 84 79 95  Resp: 14 16 14 14   Temp: 98 F (36.7 C)  99 F (37.2 C) 99.6 F (37.6 C)  TempSrc: Oral  Oral Oral  SpO2: 100% 100% 100% 100%  Weight:      Height:        Intake/Output Summary (Last 24 hours) at 11/27/2019 1019 Last data filed at 11/26/2019 1100 Gross per 24 hour  Intake 120 ml  Output --  Net 120 ml   Filed Weights   11/24/19 1332 11/24/19 1348 11/24/19 1921  Weight: 79.4 kg 79.4 kg 80.8 kg    Examination:  General exam: NAD Respiratory system: Lungs clear to auscultation bilaterally.  No wheezes, no crackles, no rhonchi.  Normal respiratory effort. Cardiovascular system: RRR no murmurs rubs or gallops.  No JVD.  No lower extremity edema.  Gastrointestinal system: Abdomen is soft,  nontender, nondistended, positive bowel sounds.  No rebound.  No guarding.  Central nervous system: Alert and oriented. No focal neurological deficits. Extremities: Symmetric 5 x 5 power. Skin: No rashes, lesions or ulcers Psychiatry: Judgement and insight appear normal. Mood & affect appropriate.     Data Reviewed: I have personally reviewed following labs and imaging studies  CBC: Recent Labs  Lab 11/21/19 0934 11/24/19 1427 11/25/19 0626 11/26/19 0624 11/27/19 0555  WBC 4.6 5.6 6.2 5.2 5.0  NEUTROABS 3.0 4.7  --  4.3 4.1  HGB 8.8* 8.2* 7.5* 7.6* 7.8*  HCT 26.4* 25.0* 23.5* 23.1* 22.8*  MCV 89.2 90.9 93.3 92.0 88.7  PLT 253 160 151 152 195    Basic Metabolic Panel: Recent Labs  Lab 11/21/19 0934 11/24/19 1427 11/24/19 1504 11/25/19 0626 11/26/19 0624 11/27/19 0555  NA 139 136  --  139 136 136  K 3.2* 2.7*  --  4.6 3.7 3.7  CL 101 97*  --  106 104 103  CO2 27 27  --  25 23 23   GLUCOSE 87 94  --  96 98 99  BUN 7 8  --  7 7 7   CREATININE 0.65 0.50  --  0.55 0.57 0.62  CALCIUM 9.7 9.1  --  8.8* 9.0 8.9  MG  --   --  1.7 2.3 1.9 1.7  PHOS  --   --   --  3.1  --   --     GFR: Estimated Creatinine Clearance: 83.9 mL/min (by C-G formula based on SCr of 0.62 mg/dL).  Liver Function Tests: Recent Labs  Lab 11/21/19 0934 11/25/19 0626 11/26/19 0624  AST 41 24 21  ALT 34 27 24  ALKPHOS 124 88 87  BILITOT 0.6 1.0 0.7  PROT 7.1 6.4* 6.4*  ALBUMIN 3.5 3.0* 3.0*    CBG: Recent Labs  Lab 11/25/19 0811 11/26/19 0707 11/27/19 0534  GLUCAP 97 87 99     Recent Results (from the past 240 hour(s))  Respiratory Panel by RT PCR (Flu A&B, Covid) - Nasopharyngeal Swab     Status: None   Collection Time: 11/24/19  3:34 PM   Specimen: Nasopharyngeal Swab  Result Value Ref Range Status   SARS Coronavirus 2 by RT PCR NEGATIVE NEGATIVE Final    Comment: (NOTE) SARS-CoV-2 target nucleic acids are NOT DETECTED.  The SARS-CoV-2 RNA is generally detectable in upper  respiratoy specimens during the acute phase of infection. The lowest concentration of SARS-CoV-2 viral copies this assay can detect is  131 copies/mL. A negative result does not preclude SARS-Cov-2 infection and should not be used as the sole basis for treatment or other patient management decisions. A negative result may occur with  improper specimen collection/handling, submission of specimen other than nasopharyngeal swab, presence of viral mutation(s) within the areas targeted by this assay, and inadequate number of viral copies (<131 copies/mL). A negative result must be combined with clinical observations, patient history, and epidemiological information. The expected result is Negative.  Fact Sheet for Patients:  PinkCheek.be  Fact Sheet for Healthcare Providers:  GravelBags.it  This test is no t yet approved or cleared by the [place] FDA and  has been authorized for detection and/or diagnosis of SARS-CoV-2 by FDA under an Emergency Use Authorization (EUA). This EUA will remain  in effect (meaning this test can be used) for the duration of the COVID-19 declaration under Section 564(b)(1) of the Act, 21 U.S.C. section 360bbb-3(b)(1), unless the authorization is terminated or revoked sooner.     Influenza A by PCR NEGATIVE NEGATIVE Final   Influenza B by PCR NEGATIVE NEGATIVE Final    Comment: (NOTE) The Xpert Xpress SARS-CoV-2/FLU/RSV assay is intended as an aid in  the diagnosis of influenza from Nasopharyngeal swab specimens and  should not be used as a sole basis for treatment. Nasal washings and  aspirates are unacceptable for Xpert Xpress SARS-CoV-2/FLU/RSV  testing.  Fact Sheet for Patients: PinkCheek.be  Fact Sheet for Healthcare Providers: GravelBags.it  This test is not yet approved or cleared by the [place] FDA and  has been  authorized for detection and/or diagnosis of SARS-CoV-2 by  FDA under an Emergency Use Authorization (EUA). This EUA will remain  in effect (meaning this test can be used) for the duration of the  Covid-19 declaration under Section 564(b)(1) of the Act, 21  U.S.C. section 360bbb-3(b)(1), unless the authorization is  terminated or revoked. Performed at [facility], [place] [address], [place], [place] [address]          Radiology Studies: No results found.      Scheduled Meds: . Chlorhexidine Gluconate Cloth  6 each Topical Daily  . enoxaparin  120 mg Subcutaneous Q24H  . feeding supplement  237 mL Oral BID BM  . fluticasone  2 spray Each Nare Daily  . LORazepam  0.5 mg Intravenous Q8H  . metoprolol tartrate  5 mg Intravenous Q8H  . pantoprazole (PROTONIX) IV  40 mg Intravenous Q12H  . polyethylene glycol  17 g Oral Daily  . senna-docusate  1 tablet Oral BID  . sodium chloride flush  3 mL Intravenous Q12H  . topiramate  50 mg Oral QHS   Continuous Infusions: . sodium chloride 100 mL/hr at 11/27/19 0547  . potassium chloride       LOS: 2 days    Time spent: 35 minutes    Irine Seal, MD [facility]   To contact the attending provider between 7A-7P or the covering provider during after hours 7P-7A, please log into the web site [email or web address] and access using universal [place] password for that web site. If you do not have the password, please call the hospital operator.  11/27/2019, 10:19 AM

## 2019-11-28 ENCOUNTER — Telehealth: Payer: Self-pay | Admitting: Nurse Practitioner

## 2019-11-28 DIAGNOSIS — K5900 Constipation, unspecified: Secondary | ICD-10-CM | POA: Diagnosis not present

## 2019-11-28 DIAGNOSIS — D6481 Anemia due to antineoplastic chemotherapy: Secondary | ICD-10-CM | POA: Diagnosis not present

## 2019-11-28 DIAGNOSIS — E876 Hypokalemia: Secondary | ICD-10-CM | POA: Diagnosis not present

## 2019-11-28 DIAGNOSIS — T451X5A Adverse effect of antineoplastic and immunosuppressive drugs, initial encounter: Secondary | ICD-10-CM | POA: Diagnosis not present

## 2019-11-28 LAB — CBC
HCT: 21.9 % — ABNORMAL LOW (ref 36.0–46.0)
Hemoglobin: 7.2 g/dL — ABNORMAL LOW (ref 12.0–15.0)
MCH: 29.6 pg (ref 26.0–34.0)
MCHC: 32.9 g/dL (ref 30.0–36.0)
MCV: 90.1 fL (ref 80.0–100.0)
Platelets: 173 10*3/uL (ref 150–400)
RBC: 2.43 MIL/uL — ABNORMAL LOW (ref 3.87–5.11)
RDW: 15.1 % (ref 11.5–15.5)
WBC: 3.9 10*3/uL — ABNORMAL LOW (ref 4.0–10.5)
nRBC: 0 % (ref 0.0–0.2)

## 2019-11-28 LAB — MAGNESIUM: Magnesium: 2.6 mg/dL — ABNORMAL HIGH (ref 1.7–2.4)

## 2019-11-28 LAB — RENAL FUNCTION PANEL
Albumin: 2.8 g/dL — ABNORMAL LOW (ref 3.5–5.0)
Anion gap: 9 (ref 5–15)
BUN: 7 mg/dL (ref 6–20)
CO2: 23 mmol/L (ref 22–32)
Calcium: 8.4 mg/dL — ABNORMAL LOW (ref 8.9–10.3)
Chloride: 105 mmol/L (ref 98–111)
Creatinine, Ser: 0.57 mg/dL (ref 0.44–1.00)
GFR, Estimated: >60 mL/min (ref >60)
Glucose, Bld: 74 mg/dL (ref 70–99)
Phosphorus: 2.5 mg/dL (ref 2.5–4.6)
Potassium: 3 mmol/L — ABNORMAL LOW (ref 3.5–5.1)
Sodium: 137 mmol/L (ref 135–145)

## 2019-11-28 LAB — GLUCOSE, CAPILLARY: Glucose-Capillary: 74 mg/dL (ref 70–99)

## 2019-11-28 LAB — PREPARE RBC (CROSSMATCH)

## 2019-11-28 MED ORDER — POTASSIUM CHLORIDE CRYS ER 20 MEQ PO TBCR
40.0000 meq | EXTENDED_RELEASE_TABLET | Freq: Two times a day (BID) | ORAL | Status: DC
  Filled 2019-11-28: qty 2

## 2019-11-28 MED ORDER — OLANZAPINE 5 MG PO TABS
2.5000 mg | ORAL_TABLET | Freq: Every day | ORAL | Status: DC
  Administered 2019-11-28 – 2019-12-01 (×4): 2.5 mg via ORAL
  Filled 2019-11-28 (×4): qty 1

## 2019-11-28 MED ORDER — PROCHLORPERAZINE EDISYLATE 10 MG/2ML IJ SOLN
10.0000 mg | Freq: Four times a day (QID) | INTRAMUSCULAR | Status: DC | PRN
  Administered 2019-11-30 – 2019-12-05 (×16): 10 mg via INTRAVENOUS
  Filled 2019-11-28 (×16): qty 2

## 2019-11-28 MED ORDER — SODIUM CHLORIDE 0.9% IV SOLUTION: Freq: Once | INTRAVENOUS | Status: DC

## 2019-11-28 MED ORDER — POTASSIUM CHLORIDE 10 MEQ/100ML IV SOLN
10.0000 meq | INTRAVENOUS | Status: AC
  Administered 2019-11-28 (×6): 10 meq via INTRAVENOUS
  Filled 2019-11-28: qty 100

## 2019-11-28 NOTE — Telephone Encounter (Signed)
Scheduled per 10/20 los, patient has been called and voicemail was left.

## 2019-11-28 NOTE — Progress Notes (Signed)
Physical Therapy Treatment Patient Details Name: Cynthia Frye MRN: [id number] DOB: 26-Oct-1960 Today's Date: 11/28/2019    History of Present Illness 59 year old unfortunate female history of recently diagnosed metastatic pancreatic adenocarcinoma with mets to the liver 09/19/2019, currently undergoing chemotherapy, recent chemotherapy 11/21/2019 presenting with 1 day history of worsening nausea, vomiting, abdominal pain.  Noted to have significant hypokalemia, admitted to [facility]    PT Comments    Pt feeling very sleepy "maybe from the nausea meds" stated pt.  Assisted OOB to amb.  General transfer comment: required increased physical assist torise this session die to increased c/o weakness and fatigue.  "I feel so sleepy" General Gait Details: used a walker this session due to increased c/o of fatigue/weakness and "very sleepy".  Decreased amb distance as well this session.  Assisted back to bed and positioned to comfort.     Follow Up Recommendations  No PT follow up     Equipment Recommendations  None recommended by PT    Recommendations for Other Services       Precautions / Restrictions Precautions Precautions: Fall Precaution Comments: CHEMO precautions    Mobility  Bed Mobility Overal bed mobility: Modified Independent             General bed mobility comments: increased time, feeling very sleepy  Transfers Overall transfer level: Needs assistance Equipment used: Rolling walker (2 wheeled) Transfers: Sit to/from Stand Sit to Stand: Min assist Stand pivot transfers: Min assist       General transfer comment: required increased physical assist torise this session die to increased c/o weakness and fatigue.  "I feel so sleepy"  Ambulation/Gait Ambulation/Gait assistance: Min guard Gait Distance (Feet): 35 Feet Assistive device: Rolling walker (2 wheeled) Gait Pattern/deviations: Step-through pattern;Decreased stride length;Narrow base of support Gait velocity:  decreased   General Gait Details: used a walker this session due to increased c/o of fatigue/weakness and "very sleepy".  Decreased amb distance as well this session.   Stairs             Wheelchair Mobility    Modified Rankin (Stroke Patients Only)       Balance                                            Cognition Arousal/Alertness: Awake/alert Behavior During Therapy: WFL for tasks assessed/performed;Flat affect Overall Cognitive Status: Within Functional Limits for tasks assessed                                 General Comments: AxO x 3 very pleasant (feeling poorly)      Exercises      General Comments        Pertinent Vitals/Pain Pain Assessment: No/denies pain    Home Living                      Prior Function            PT Goals (current goals can now be found in the care plan section) Progress towards PT goals: Progressing toward goals    Frequency    Min 3X/week      PT Plan Current plan remains appropriate    Co-evaluation              AM-PAC PT "6 Clicks" Mobility  Outcome Measure  Help needed turning from your back to your side while in a flat bed without using bedrails?: None Help needed moving from lying on your back to sitting on the side of a flat bed without using bedrails?: A Little Help needed moving to and from a bed to a chair (including a wheelchair)?: A Little Help needed standing up from a chair using your arms (e.g., wheelchair or bedside chair)?: A Little Help needed to walk in hospital room?: A Little Help needed climbing 3-5 steps with a railing? : A Lot 6 Click Score: 18    End of Session Equipment Utilized During Treatment: Gait belt Activity Tolerance: Patient limited by fatigue Patient left: in bed;with call bell/phone within reach Nurse Communication: Mobility status PT Visit Diagnosis: Difficulty in walking, not elsewhere classified (R26.2)     Time:  3299-2426 PT Time Calculation (min) (ACUTE ONLY): 17 min  Charges:  $Gait Training: 8-22 mins                     Rica Koyanagi  PTA Acute  Rehabilitation Services Pager      [phone] Office      [phone]

## 2019-11-28 NOTE — Progress Notes (Signed)
PROGRESS NOTE    Cynthia Frye  PQZ:[id number] DOB: 1960/04/14 DOA: 11/24/2019 PCP: Josetta Huddle, MD    Brief Narrative:  59 year old unfortunate female history of recently diagnosed metastatic pancreatic adenocarcinoma with mets to the liver 09/19/2019, currently undergoing chemotherapy, recent chemotherapy 11/21/2019 presenting with 1 day history of worsening nausea, vomiting, abdominal pain.  Noted to have significant hypokalemia with inability to keep anything down.  CT abdomen and pelvis negative for small bowel obstruction.  Urinalysis concerning for UTI.  Patient started on IV antibiotics, scheduled IV antiemetics, IV fluids, potassium repletion.  Assessment & Plan:   Principal Problem:   Hypokalemia Active Problems:   Essential hypertension   Lupus (systemic lupus erythematosus) (HCC)   Seasonal allergies   Chronic migraine without aura without status migrainosus, not intractable   Pancreatic cancer (HCC)   Acute deep vein thrombosis of left iliac vein (HCC)   Nausea and vomiting in adult   Dehydration   Prolonged QT interval   Emesis   Tachycardia   Anemia associated with chemotherapy   Constipation   Generalized abdominal pain   Malnutrition of moderate degree   1 Hypokalemia Secondary to GI losses from nausea and emesis postchemotherapy. Magnesium level at 1.7 on admission.  Potassium was 2.7 on admission. This AM, K remains low, will replace Repeat bmet in AM  2. Nausea and vomiting Likely chemotherapy induced. Patient with recent chemotherapy 11/21/2019. Patient noted to have had similar episodes recently after first cycle of chemotherapy with nausea vomiting and diarrhea and had to be hospitalized from 11/04/2019-11/09/2019 in addition to colitis.    Patient on admission initially placed on scheduled IV Ativan, IV Decadron, Zofran as needed with improvement with nausea and emesis.   Continue IV Decadron.  Continue as needed Zofran.  Continue IV fluids.    Seen at bedside with Dr. Sherran Needs with clears for now Will ensure adequate anti-emetic as needed  3. Dehydration Continue IV fluids and normal saline 100 cc/h.   Continue with clear liquids as tolerated Repeat bmet in AM   4. Tachycardia Likely secondary to volume depletion, nausea vomiting, possible beta-blockade withdrawal as patient unable to keep medications down.   Resolved. Continue hydration with IV fluids as tolerated Continue Lopressor.   5. Prolonged QT interval Repeat EKG with resolution of QTC prolongation.  Keep magnesium >2, potassium >4.  Avoid QTC prolongation medications.  Supportive care.  Follow.  6. Pancreatic adenocarcinoma in the head, with multiple liver lesionslikely liver metastases Recently diagnosed per CT scan on 09/19/2019, EUS biopsy per Dr. Paulita Fujita 09/25/2019 consistent with pancreatic adenocarcinoma. Patient status post cycle 1 FOLFIRINOX 10/24/2019. Patient stated started cycle 2 of chemotherapy 11/21/2019. Patient noted per med rec to be on Decadron 4 mg daily after chemotherapy for 3 to 5 days and as such due to nausea and vomiting patient started on Decadron 4 mg IV daily x3 days.   Oncology, Dr. Burr Medico has assessed the patient and is currently following.  Continue with analgesic and antiemetics as tolerated  7. Recent left leg vein DVT Continued on full dose Lovenox.    8. Upper abdominal cramps Likely secondary to metastatic pancreatic adenocarcinoma.    Continue to ensure regular bowel movements given concurrent analgesics Continue with analgesia as needed On PPI  9. Lupus Being followed in the outpatient setting by rheumatologist, Dr. Trudie Reed on weekly methotrexate injections, Plaquenil and diclofenac.  Recommend f/u with Rheumatology as outpatient.  10. Probable UTI Urinalysis with moderate leukocytes, nitrite negative, rare bacteria, 21-50  WBCs.   Urine cx remains pending Pt is now status post 3 days IV Rocephin  11.  Anxiety Resume scheduled IV Ativan due to nausea and emesis.  Once tolerating oral intake could likely transition to oral Ativan as needed.  Supportive care.   12. Hypertension Patient still with nausea and emesis.   Continue IV Lopressor while pt is still nauseated Once tolerating oral intake could transition back to home regimen oral antihypertensive medications.   13.  Constipation Patient with large bowel movement RN after soapsuds enema was given. Continue with cathartics as needed  14.  Anemia of chronic disease/chemotherapy-induced anemia Likely chemo induced.  Patient denies any overt bleeding.  Consistent with anemia of chronic disease.  Hemoglobin currently at 7.2.  Per hematology transfusion threshold hemoglobin < 7.5. Will order 1 unit PRBC  DVT prophylaxis: Lovenox Code Status: Full Family Communication: Pt in room  Status is: Inpatient  Remains inpatient appropriate because:IV treatments appropriate due to intensity of illness or inability to take PO   Dispo: The patient is from: Home              Anticipated d/c is to: Home              Anticipated d/c date is: 3 days              Patient currently is not medically stable to d/c.       Consultants:   Oncology  Procedures:     Antimicrobials: Anti-infectives (From admission, onward)   Start     Dose/Rate Route Frequency Ordered Stop   11/24/19 2000  cefTRIAXone (ROCEPHIN) 1 g in sodium chloride 0.9 % 100 mL IVPB  Status:  Discontinued        1 g 200 mL/hr over 30 Minutes Intravenous Every 24 hours 11/24/19 1912 11/27/19 0759       Subjective: Still nauseated this AM, unable to even tolerate pills  Objective: Vitals:   11/27/19 1252 11/27/19 2149 11/28/19 0651 11/28/19 1351  BP: 136/83 131/75 126/80 (!) 151/83  Pulse: 90 79 80 75  Resp: 18 16 16 15   Temp: 98.1 F (36.7 C) 98 F (36.7 C) 98.2 F (36.8 C) 98.4 F (36.9 C)  TempSrc: Oral Oral Oral Oral  SpO2: 100% 100% 100% 100%    Weight:      Height:        Intake/Output Summary (Last 24 hours) at 11/28/2019 1758 Last data filed at 11/28/2019 1047 Gross per 24 hour  Intake 3031.71 ml  Output --  Net 3031.71 ml   Filed Weights   11/24/19 1332 11/24/19 1348 11/24/19 1921  Weight: 79.4 kg 79.4 kg 80.8 kg    Examination:  General exam: Appears calm, appears mildly uncomfortable Respiratory system: Clear to auscultation. Respiratory effort normal. Cardiovascular system: S1 & S2 heard, Regular Gastrointestinal system: Abdomen is nondistended, soft and nontender. No organomegaly or masses felt. Normal bowel sounds heard. Central nervous system: Alert and oriented. No focal neurological deficits. Extremities: Symmetric 5 x 5 power. Skin: No rashes, lesions  Psychiatry: Judgement and insight appear normal. Mood & affect appropriate.   Data Reviewed: I have personally reviewed following labs and imaging studies  CBC: Recent Labs  Lab 11/24/19 1427 11/25/19 0626 11/26/19 0624 11/27/19 0555 11/28/19 0601  WBC 5.6 6.2 5.2 5.0 3.9*  NEUTROABS 4.7  --  4.3 4.1  --   HGB 8.2* 7.5* 7.6* 7.8* 7.2*  HCT 25.0* 23.5* 23.1* 22.8* 21.9*  MCV 90.9 93.3  92.0 88.7 90.1  PLT 160 151 152 176 878   Basic Metabolic Panel: Recent Labs  Lab 11/24/19 1427 11/24/19 1504 11/25/19 0626 11/26/19 0624 11/27/19 0555 11/28/19 0601  NA 136  --  139 136 136 137  K 2.7*  --  4.6 3.7 3.7 3.0*  CL 97*  --  106 104 103 105  CO2 27  --  25 23 23 23   GLUCOSE 94  --  96 98 99 74  BUN 8  --  7 7 7 7   CREATININE 0.50  --  0.55 0.57 0.62 0.57  CALCIUM 9.1  --  8.8* 9.0 8.9 8.4*  MG  --  1.7 2.3 1.9 1.7 2.6*  PHOS  --   --  3.1  --   --  2.5   GFR: Estimated Creatinine Clearance: 83.9 mL/min (by C-G formula based on SCr of 0.57 mg/dL). Liver Function Tests: Recent Labs  Lab 11/25/19 0626 11/26/19 0624 11/28/19 0601  AST 24 21  --   ALT 27 24  --   ALKPHOS 88 87  --   BILITOT 1.0 0.7  --   PROT 6.4* 6.4*  --   ALBUMIN  3.0* 3.0* 2.8*   No results for input(s): LIPASE, AMYLASE in the last 168 hours. No results for input(s): AMMONIA in the last 168 hours. Coagulation Profile: No results for input(s): INR, PROTIME in the last 168 hours. Cardiac Enzymes: No results for input(s): CKTOTAL, CKMB, CKMBINDEX, TROPONINI in the last 168 hours. BNP (last 3 results) No results for input(s): PROBNP in the last 8760 hours. HbA1C: No results for input(s): HGBA1C in the last 72 hours. CBG: Recent Labs  Lab 11/25/19 0811 11/26/19 0707 11/27/19 0534 11/28/19 0655  GLUCAP 97 87 99 74   Lipid Profile: No results for input(s): CHOL, HDL, LDLCALC, TRIG, CHOLHDL, LDLDIRECT in the last 72 hours. Thyroid Function Tests: No results for input(s): TSH, T4TOTAL, FREET4, T3FREE, THYROIDAB in the last 72 hours. Anemia Panel: No results for input(s): VITAMINB12, FOLATE, FERRITIN, TIBC, IRON, RETICCTPCT in the last 72 hours. Sepsis Labs: No results for input(s): PROCALCITON, LATICACIDVEN in the last 168 hours.  Recent Results (from the past 240 hour(s))  Respiratory Panel by RT PCR (Flu A&B, Covid) - Nasopharyngeal Swab     Status: None   Collection Time: 11/24/19  3:34 PM   Specimen: Nasopharyngeal Swab  Result Value Ref Range Status   SARS Coronavirus 2 by RT PCR NEGATIVE NEGATIVE Final    Comment: (NOTE) SARS-CoV-2 target nucleic acids are NOT DETECTED.  The SARS-CoV-2 RNA is generally detectable in upper respiratoy specimens during the acute phase of infection. The lowest concentration of SARS-CoV-2 viral copies this assay can detect is 131 copies/mL. A negative result does not preclude SARS-Cov-2 infection and should not be used as the sole basis for treatment or other patient management decisions. A negative result may occur with  improper specimen collection/handling, submission of specimen other than nasopharyngeal swab, presence of viral mutation(s) within the areas targeted by this assay, and inadequate  number of viral copies (<131 copies/mL). A negative result must be combined with clinical observations, patient history, and epidemiological information. The expected result is Negative.  Fact Sheet for Patients:  PinkCheek.be  Fact Sheet for Healthcare Providers:  GravelBags.it  This test is no t yet approved or cleared by the [place] FDA and  has been authorized for detection and/or diagnosis of SARS-CoV-2 by FDA under an Emergency Use Authorization (EUA). This EUA will  remain  in effect (meaning this test can be used) for the duration of the COVID-19 declaration under Section 564(b)(1) of the Act, 21 U.S.C. section 360bbb-3(b)(1), unless the authorization is terminated or revoked sooner.     Influenza A by PCR NEGATIVE NEGATIVE Final   Influenza B by PCR NEGATIVE NEGATIVE Final    Comment: (NOTE) The Xpert Xpress SARS-CoV-2/FLU/RSV assay is intended as an aid in  the diagnosis of influenza from Nasopharyngeal swab specimens and  should not be used as a sole basis for treatment. Nasal washings and  aspirates are unacceptable for Xpert Xpress SARS-CoV-2/FLU/RSV  testing.  Fact Sheet for Patients: PinkCheek.be  Fact Sheet for Healthcare Providers: GravelBags.it  This test is not yet approved or cleared by the [place] FDA and  has been authorized for detection and/or diagnosis of SARS-CoV-2 by  FDA under an Emergency Use Authorization (EUA). This EUA will remain  in effect (meaning this test can be used) for the duration of the  Covid-19 declaration under Section 564(b)(1) of the Act, 21  U.S.C. section 360bbb-3(b)(1), unless the authorization is  terminated or revoked. Performed at [facility], [place] [address], [place], [place] [address]      Radiology Studies: No results found.  Scheduled Meds: . Chlorhexidine Gluconate  Cloth  6 each Topical Daily  . enoxaparin  120 mg Subcutaneous Q24H  . feeding supplement  237 mL Oral BID BM  . fluticasone  2 spray Each Nare Daily  . LORazepam  0.5 mg Intravenous Q8H  . metoprolol tartrate  5 mg Intravenous Q8H  . pantoprazole (PROTONIX) IV  40 mg Intravenous Q24H  . polyethylene glycol  17 g Oral Daily  . senna-docusate  1 tablet Oral BID  . sodium chloride flush  3 mL Intravenous Q12H  . topiramate  50 mg Oral QHS   Continuous Infusions: . sodium chloride 100 mL/hr at 11/28/19 0748  . potassium chloride 10 mEq (11/28/19 1705)     LOS: 3 days   Marylu Lund, MD [facility] Pager On Amion  If 7PM-7AM, please contact night-coverage 11/28/2019, 5:58 PM

## 2019-11-28 NOTE — Progress Notes (Signed)
HEMATOLOGY-ONCOLOGY PROGRESS NOTE  SUBJECTIVE: Pt is recovering very slowly, had N/V even after water, was very drowsy from Ativan this morning, she was able to use bathroom by herself, but she is still spending most time in bed.   Oncology History Overview Note  Cancer Staging Pancreatic cancer [facility]) Staging form: Exocrine Pancreas, AJCC 8th Edition - Clinical stage from 10/18/2019: Stage IV (cT3, cN1, cM1) - Signed by Truitt Merle, MD on 10/18/2019    Pancreatic cancer ([place])  09/19/2019 Imaging   CT AP w contrast 09/19/19  IMPRESSION: 1. Findings are highly concerning for probable primary pancreatic adenocarcinoma in the anterior aspect of the pancreatic head. Several prominent borderline enlarged lymph nodes are noted in the hepatoduodenal nodal station, and there are multiple indeterminate liver lesions which are highly concerning for probable hepatic metastases. Further evaluation with nonemergent abdominal MRI with and without IV gadolinium with MRCP is recommended in the near future to better evaluate these findings.   09/25/2019 Procedure   Upper EUS by Dr Paulita Fujita  IMPRESSION -There was no evidence of significant pathology in the left lobe of the liver.  -A few lymph nodes were visualized and measures in the peripancreatic region and porta hepata region.  -Hyperchoic material consistent with sludge was visualized endosonographically in the gallbladder.  -There was no sign of significant pathology in the common bile duct.  -A mass was identified in the pancreatic head. If biopsy results show adenocarcinoma, it would be staged T3N1Mx by endosonographic criteria. Fine needle aspiration performed.    09/25/2019 Initial Biopsy   FINAL MICROSCOPIC DIAGNOSIS: Fine needle aspirate, Pancreas;  MALIGNANT CELLS PRESENT CONSISTENT WITH ADENOCARCINOMA.    10/02/2019 Initial Diagnosis   Pancreatic cancer ([place])   10/11/2019 Procedure   PAC placement y Dr Barry Dienes    10/15/2019 Imaging   CT  Chest  IMPRESSION: 1. Small anterior left pneumothorax with dependent atelectasis in the left lower lobe. 2. Increased number of bilateral axillary and subpectoral lymph nodes with mild lymphadenopathy in the left axilla. While this would be an atypical presentation for metastatic pancreatic cancer, this possibility is not excluded 3. Main duct dilatation in the pancreas, better assessed on abdomen CT 09/19/2019.   10/16/2019 Imaging   MRI Abdomen  IMPRESSION: 1. Substantially motion degraded scan. 2. Probable persistent small anterior left lung base pneumothorax, better seen on chest CT from 1 day prior. 3. Poorly marginated hypoenhancing 3.7 x 2.9 cm pancreatic head mass, which appears to invade the anterior peripancreatic fat, compatible with known pancreatic adenocarcinoma. Diffuse irregular dilatation of the main pancreatic duct in the pancreatic body and tail. Mild narrowing of the main portal vein by the mass. Abdominal vasculature remains patent and otherwise uninvolved. 4. Numerous (greater than 10) small liver masses scattered throughout the liver, largest 1.0 cm, which appear to demonstrate targetoid enhancement on the limited motion degraded postcontrast sequences, compatible with liver metastases. 5. Mild porta hepatis adenopathy, suspicious for metastatic disease.   10/18/2019 Cancer Staging   Staging form: Exocrine Pancreas, AJCC 8th Edition - Clinical stage from 10/18/2019: Stage IV (cT3, cN1, cM1) - Signed by Truitt Merle, MD on 10/18/2019   10/24/2019 -  Chemotherapy   First-line FOLFIRINOX q2weeks starting 10/24/19   10/30/2019 Pathology Results   FINAL MICROSCOPIC DIAGNOSIS:   A. LIVER, LESION, BIOPSY:  -  Metastatic carcinoma  -  See comment   COMMENT:   By immunohistochemistry, the neoplastic cells are positive for  cytokeratin 7 and GATA3 with patchy nonspecific staining for PAX 8 but  negative for TTF-1, CDX2 and cytokeratin 20.  Overall, the findings are   consistent with metastasis of the patient's known breast carcinoma.  Prognostic panel (ER, PR, Her-2) is pending and will be reported in an  addendum.    ADDENDUM:   Dr. Laurence Ferrari notified us (November 01, 2019) that the patient was also  being worked up for a pancreatic mass.  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  In addition, ER and PR  are negative.  Gata-3 can be expressed in the pancreatic  adenocarcinomas; therefore, pancreatobiliary primary remains in the  differential.    11/02/2019 Genetic Testing   Negative genetic testing: no pathogenic variants detected in [facility] Common Hereditary Cancers Panel.  The report date is November 02, 2019.   The Common Hereditary Cancers Panel offered by [facility] includes sequencing and/or deletion duplication testing of the following 48 genes: APC, ATM, AXIN2, BARD1, BMPR1A, BRCA1, BRCA2, BRIP1, CDH1, CDK4, CDKN2A (p14ARF), CDKN2A (p16INK4a), CHEK2, CTNNA1, DICER1, EPCAM (Deletion/duplication testing only), GREM1 (promoter region deletion/duplication testing only), KIT, MEN1, MLH1, MSH2, MSH3, MSH6, MUTYH, NBN, NF1, NHTL1, PALB2, PDGFRA, PMS2, POLD1, POLE, PTEN, RAD50, RAD51C, RAD51D, RNF43, SDHB, SDHC, SDHD, SMAD4, SMARCA4. STK11, TP53, TSC1, TSC2, and VHL.  The following genes were evaluated for sequence changes only: SDHA and HOXB13 c.251G>A variant only.   11/04/2019 Imaging   CT AP  IMPRESSION: 1. Circumferential bowel wall thickening with adjacent fat stranding throughout the colon, most predominant in the transverse colon. This is consistent with colitis, which may be infectious or inflammatory in etiology. 2. Ill-defined pancreatic head mass consistent with known malignancy, similar to mildly increased in comparison to prior CT. There are innumerable hypodense masses throughout the liver, increased in conspicuity in comparison to prior CT. Findings are worrisome for worsening metastatic  disease. 3. Filling defect in the LEFT internal iliac vein, likely a thrombus with differential considerations including mixing artifact.      REVIEW OF SYSTEMS:   Constitutional: Denies fevers, chills  Respiratory: Denies cough, dyspnea or wheezes Cardiovascular: Denies palpitation, chest discomfort Gastrointestinal: Reports abdominal fullness this morning.  Denies nausea and vomiting.  Denies diarrhea.  Denies abdominal cramping. Skin: Denies abnormal skin rashes Lymphatics: Denies new lymphadenopathy or easy bruising Neurological:Denies numbness, tingling or new weaknesses Behavioral/Psych: Mood is stable, no new changes  Extremities: No lower extremity edema All other systems were reviewed with the patient and are negative.  I have reviewed the past medical history, past surgical history, social history and family history with the patient and they are unchanged from previous note.   PHYSICAL EXAMINATION: ECOG PERFORMANCE STATUS: 3  Vitals:   11/28/19 0651 11/28/19 1351  BP: 126/80 (!) 151/83  Pulse: 80 75  Resp: 16 15  Temp: 98.2 F (36.8 C) 98.4 F (36.9 C)  SpO2: 100% 100%   Filed Weights   11/24/19 1332 11/24/19 1348 11/24/19 1921  Weight: 175 lb (79.4 kg) 175 lb (79.4 kg) 178 lb 2.1 oz (80.8 kg)    Intake/Output from previous day: 11/02 0701 - 11/03 0700 In: 2811.7 [I.V.:2811.7] Out: -   GENERAL:alert, no distress and comfortable SKIN: skin color, texture, turgor are normal, no rashes or significant lesions LUNGS: clear to auscultation and percussion with normal breathing effort HEART: regular rate & rhythm and no murmurs and no lower extremity edema ABDOMEN: Positive bowel sounds, soft, nontender Musculoskeletal:no cyanosis of digits and no clubbing  NEURO: alert & oriented x 3 with fluent speech, no focal motor/sensory deficits  LABORATORY DATA:  I have reviewed the data as listed CMP Latest Ref Rng & Units 11/28/2019 11/27/2019 11/26/2019  Glucose 70 -  99 mg/dL 74 99 98  BUN 6 - 20 mg/dL '7 7 7  ' Creatinine 0.44 - 1.00 mg/dL 0.57 0.62 0.57  Sodium 135 - 145 mmol/L 137 136 136  Potassium 3.5 - 5.1 mmol/L 3.0(L) 3.7 3.7  Chloride 98 - 111 mmol/L 105 103 104  CO2 22 - 32 mmol/L '23 23 23  ' Calcium 8.9 - 10.3 mg/dL 8.4(L) 8.9 9.0  Total Protein 6.5 - 8.1 g/dL - - 6.4(L)  Total Bilirubin 0.3 - 1.2 mg/dL - - 0.7  Alkaline Phos 38 - 126 U/L - - 87  AST 15 - 41 U/L - - 21  ALT 0 - 44 U/L - - 24    Lab Results  Component Value Date   WBC 3.9 (L) 11/28/2019   HGB 7.2 (L) 11/28/2019   HCT 21.9 (L) 11/28/2019   MCV 90.1 11/28/2019   PLT 173 11/28/2019   NEUTROABS 4.1 11/27/2019    DG Chest 2 View  Result Date: 11/24/2019 CLINICAL DATA:  Nausea and vomiting. EXAM: CHEST - 2 VIEW COMPARISON:  November 04, 2019 FINDINGS: There is stable left-sided venous Port-A-Cath positioning. The heart size and mediastinal contours are within normal limits. Both lungs are clear. The visualized skeletal structures are unremarkable. IMPRESSION: No active cardiopulmonary disease. Electronically Signed   By: Virgina Norfolk M.D.   On: 11/24/2019 18:52   CT Abdomen Pelvis W Contrast  Result Date: 11/24/2019 CLINICAL DATA:  Abdominal distension, pain, nausea, vomiting. Pancreatic cancer EXAM: CT ABDOMEN AND PELVIS WITH CONTRAST TECHNIQUE: Multidetector CT imaging of the abdomen and pelvis was performed using the standard protocol following bolus administration of intravenous contrast. CONTRAST:  166m OMNIPAQUE IOHEXOL 300 MG/ML  SOLN COMPARISON:  11/04/2019 FINDINGS: Lower chest: Scarring at the left lung base.  No acute abnormality. Hepatobiliary: Numerous lesions throughout the liver, enlarging since prior study. Index right hepatic dome lesion measures 2.1 cm compared to 1.6 cm previously. Inferior right hepatic lesion measures 1.4 cm compared with 9 mm previously. Gallbladder unremarkable. Pancreas: Pancreatic head mass measuring up to 3.4 cm compared to 3.2 cm  previously, likely not significantly changed. Pancreatic ductal dilatation in the body and tail. Spleen: No focal abnormality.  Normal size. Adrenals/Urinary Tract: No adrenal abnormality. No focal renal abnormality. No stones or hydronephrosis. Urinary bladder is unremarkable. Stomach/Bowel: Stomach, large and small bowel grossly unremarkable. Normal appendix. Vascular/Lymphatic: No evidence of aneurysm or adenopathy. Reproductive: No mass or abnormality noted. Other: No free fluid or free air. Musculoskeletal: No acute bony abnormality. IMPRESSION: Pancreatic head mass again noted compatible with patient's given history of pancreatic cancer. Numerous metastases throughout the liver, enlarging since prior study. Electronically Signed   By: KRolm BaptiseM.D.   On: 11/24/2019 17:02   CT ABDOMEN PELVIS W CONTRAST  Result Date: 11/04/2019 CLINICAL DATA:  Nausea and vomiting, history of pancreatic cancer EXAM: CT ABDOMEN AND PELVIS WITH CONTRAST TECHNIQUE: Multidetector CT imaging of the abdomen and pelvis was performed using the standard protocol following bolus administration of intravenous contrast. CONTRAST:  1041mOMNIPAQUE IOHEXOL 300 MG/ML  SOLN COMPARISON:  September 19, 2019. FINDINGS: Lower chest: No acute abnormality.  LEFT basilar atelectasis. Hepatobiliary: There are innumerable hypodense masses throughout the liver, increased in conspicuity in comparison to prior study. Representative mass of the hepatic dome measures 16 mm, previously 7 mm (series 2, image 8). Newly conspicuous mass of  the RIGHT inferior liver measures 12 mm (series 2, image 28). Gallbladder is unremarkable. There is mild central biliary ductal prominence. Pancreas: Ill-defined pancreatic mass of the pancreatic head measures approximately 3.2 x 1.9 by 3.3 cm, similar to mildly increased in comparison to prior. There is upstream pancreatic ductal dilation, unchanged in comparison to prior. This mass directly abuts the duodenum. There is  adjacent fat stranding along the head of the pancreas, similar in comparison to prior. Spleen: Unremarkable. Adrenals/Urinary Tract: Adrenals are unremarkable. Kidneys enhance symmetrically. No hydronephrosis. Bladder is unremarkable. Stomach/Bowel: No evidence of bowel obstruction. There is circumferential bowel wall thickening with adjacent fat stranding throughout the colon. This is most predominant in the transverse colon. Appendix is unremarkable. Vascular/Lymphatic: Peripancreatic lymph node measures 9 mm, unchanged (series 2, image 28). There is a filling defect in the LEFT internal iliac vein. Minimal atherosclerotic calcifications. Aortic branches are patent. Reproductive: Status post hysterectomy. Other: No free air. Musculoskeletal: No acute or significant osseous findings. IMPRESSION: 1. Circumferential bowel wall thickening with adjacent fat stranding throughout the colon, most predominant in the transverse colon. This is consistent with colitis, which may be infectious or inflammatory in etiology. 2. Ill-defined pancreatic head mass consistent with known malignancy, similar to mildly increased in comparison to prior CT. There are innumerable hypodense masses throughout the liver, increased in conspicuity in comparison to prior CT. Findings are worrisome for worsening metastatic disease. 3. Filling defect in the LEFT internal iliac vein, likely a thrombus with differential considerations including mixing artifact. Electronically Signed   By: [name] MD   On: 11/04/2019 19:10   US BIOPSY (LIVER)  Result Date: 10/30/2019 INDICATION: 59 year old female with a history of pancreatic cancer and small hepatic lesions concerning for metastatic disease. EXAM: ULTRASOUND BIOPSY CORE LIVER MEDICATIONS: None. ANESTHESIA/SEDATION: Moderate (conscious) sedation was employed during this procedure. A total of Versed 4 mg and Fentanyl 100 mcg was administered intravenously. Moderate Sedation Time: 14  minutes. The patient's level of consciousness and vital signs were monitored continuously by radiology nursing throughout the procedure under my direct supervision. FLUOROSCOPY TIME:  None. COMPLICATIONS: None immediate. PROCEDURE: Informed written consent was obtained from the patient after a thorough discussion of the procedural risks, benefits and alternatives. All questions were addressed. Maximal Sterile Barrier Technique was utilized including caps, mask, sterile gowns, sterile gloves, sterile drape, hand hygiene and skin antiseptic. A timeout was performed prior to the initiation of the procedure. Ultrasound was used to interrogate the liver. There are multiple small hypoechoic solid lesions scattered throughout the liver. A suitable lesion in the right hemi liver was identified. A skin entry site was selected and marked. The overlying skin was sterilely prepped and draped in the standard fashion using chlorhexidine skin prep. Local anesthesia was attained by infiltration with 1% lidocaine. A small dermatotomy was made. Under real-time ultrasound guidance, a 17 gauge introducer needle was advanced through the liver and positioned at the margin of the lesion. Multiple 18 gauge core biopsies were then obtained coaxially using the bio Pince automated biopsy device. Biopsy specimens were placed in formalin and delivered to pathology for further analysis. As the introducer needle was removed, the biopsy tract was embolized with a Gel-Foam slurry. Post biopsy ultrasound images demonstrate no evidence of hematoma or immediate complication. The patient tolerated the procedure well. IMPRESSION: Technically successful ultrasound-guided core biopsy of liver lesion. Signed, Criselda Peaches, MD, [facility] Radiology Electronically Signed   By: Dellis Filbert.D.  On: 10/30/2019 15:34   DG Chest Port 1 View  Result Date: 11/04/2019 CLINICAL DATA:  Nausea,  vomiting and diarrhea since chemotherapy 2 weeks prior, history of pancreatic cancer EXAM: PORTABLE CHEST 1 VIEW COMPARISON:  Radiograph 10/28/2019 FINDINGS: Accessed left subclavian approach Port-A-Cath tip terminates at the superior cavoatrial junction in similar position to prior. Telemetry leads overlie the chest. Stable streaky opacities in the left lung base likely reflecting scarring or subsegmental atelectasis, less likely infection. No new consolidative opacity. No pneumothorax or effusion. The cardiomediastinal contours are unremarkable. No acute osseous or soft tissue abnormality. IMPRESSION: Stable streaky opacities in the left lung base likely reflecting scarring or subsegmental atelectasis, less likely infection. No new acute cardiopulmonary abnormality. Electronically Signed   By: Lovena Le M.D.   On: 11/04/2019 20:59   VAS [place] LOWER EXTREMITY VENOUS (DVT) ([facility] and [facility] 7a-7p)  Result Date: 11/05/2019  Lower Venous DVT Study Indications: CT Abdomen filling defect.  Risk Factors: Cancer. Comparison Study: No prior studies. Performing Technologist: Oliver Hum RVT  Examination Guidelines: A complete evaluation includes B-mode imaging, spectral Doppler, color Doppler, and power Doppler as needed of all accessible portions of each vessel. Bilateral testing is considered an integral part of a complete examination. Limited examinations for reoccurring indications may be performed as noted. The reflux portion of the exam is performed with the patient in reverse Trendelenburg.  +-----+---------------+---------+-----------+----------+--------------+ RIGHTCompressibilityPhasicitySpontaneityPropertiesThrombus Aging +-----+---------------+---------+-----------+----------+--------------+ CFV  Full           Yes      Yes                                 +-----+---------------+---------+-----------+----------+--------------+    +---------+---------------+---------+-----------+----------+--------------+ LEFT     CompressibilityPhasicitySpontaneityPropertiesThrombus Aging +---------+---------------+---------+-----------+----------+--------------+ CFV      Full           Yes      Yes                                 +---------+---------------+---------+-----------+----------+--------------+ SFJ      Full                                                        +---------+---------------+---------+-----------+----------+--------------+ FV Prox  Full                                                        +---------+---------------+---------+-----------+----------+--------------+ FV Mid   Full                                                        +---------+---------------+---------+-----------+----------+--------------+ FV DistalFull                                                        +---------+---------------+---------+-----------+----------+--------------+  PFV      Full                                                        +---------+---------------+---------+-----------+----------+--------------+ POP      Full           Yes      Yes                                 +---------+---------------+---------+-----------+----------+--------------+ PTV      Full                                                        +---------+---------------+---------+-----------+----------+--------------+ PERO     Full                                                        +---------+---------------+---------+-----------+----------+--------------+ EIV                     Yes      Yes                                 +---------+---------------+---------+-----------+----------+--------------+ CIV                                                   Not visualized +---------+---------------+---------+-----------+----------+--------------+     Summary: RIGHT: - No evidence of common  femoral vein obstruction.  LEFT: - There is no evidence of deep vein thrombosis in the lower extremity.  - No cystic structure found in the popliteal fossa.  *See table(s) above for measurements and observations. Electronically signed by Monica Martinez MD on 11/05/2019 at 5:21:58 PM.    Final     ASSESSMENT AND PLAN: 1.  Metastatic pancreatic adenocarcinoma 2.  Intractable nausea and vomiting secondary to chemotherapy, recovering slowly  3.  Dehydration, improved 4.  Anemia secondary to chemotherapy 5.  Left iliac vein DVT 6.  Lupus 7.  Hypertension 8.  Migraines 9.  Anxiety  -We reviewed antiemetics, she was too drowsy from Ativan, I recommend her to use Zofran and Compazine more, and ativan only at evening -will add olanzapine low dose 2.77m HS  -I encouraged her to try clear liquid such as broth -continue IVF, please give one unit blood for Hg 7.2 today     LOS: 3 days     YTruitt Merle 11/28/2019

## 2019-11-29 ENCOUNTER — Other Ambulatory Visit: Payer: BC Managed Care – PPO

## 2019-11-29 ENCOUNTER — Ambulatory Visit: Payer: BC Managed Care – PPO | Admitting: Nurse Practitioner

## 2019-11-29 ENCOUNTER — Ambulatory Visit: Payer: BC Managed Care – PPO

## 2019-11-29 DIAGNOSIS — T451X5A Adverse effect of antineoplastic and immunosuppressive drugs, initial encounter: Secondary | ICD-10-CM | POA: Diagnosis not present

## 2019-11-29 DIAGNOSIS — E876 Hypokalemia: Secondary | ICD-10-CM | POA: Diagnosis not present

## 2019-11-29 DIAGNOSIS — D6481 Anemia due to antineoplastic chemotherapy: Secondary | ICD-10-CM | POA: Diagnosis not present

## 2019-11-29 DIAGNOSIS — G43709 Chronic migraine without aura, not intractable, without status migrainosus: Secondary | ICD-10-CM | POA: Diagnosis not present

## 2019-11-29 LAB — COMPREHENSIVE METABOLIC PANEL
ALT: 22 U/L (ref 0–44)
AST: 24 U/L (ref 15–41)
Albumin: 3 g/dL — ABNORMAL LOW (ref 3.5–5.0)
Alkaline Phosphatase: 103 U/L (ref 38–126)
Anion gap: 12 (ref 5–15)
BUN: <5 mg/dL — ABNORMAL LOW (ref 6–20)
CO2: 22 mmol/L (ref 22–32)
Calcium: 8.8 mg/dL — ABNORMAL LOW (ref 8.9–10.3)
Chloride: 104 mmol/L (ref 98–111)
Creatinine, Ser: 0.51 mg/dL (ref 0.44–1.00)
GFR, Estimated: >60 mL/min (ref >60)
Glucose, Bld: 73 mg/dL (ref 70–99)
Potassium: 3.2 mmol/L — ABNORMAL LOW (ref 3.5–5.1)
Sodium: 138 mmol/L (ref 135–145)
Total Bilirubin: 1 mg/dL (ref 0.3–1.2)
Total Protein: 6.2 g/dL — ABNORMAL LOW (ref 6.5–8.1)

## 2019-11-29 LAB — CBC
HCT: 27.8 % — ABNORMAL LOW (ref 36.0–46.0)
Hemoglobin: 9.5 g/dL — ABNORMAL LOW (ref 12.0–15.0)
MCH: 30.4 pg (ref 26.0–34.0)
MCHC: 34.2 g/dL (ref 30.0–36.0)
MCV: 88.8 fL (ref 80.0–100.0)
Platelets: 174 10*3/uL (ref 150–400)
RBC: 3.13 MIL/uL — ABNORMAL LOW (ref 3.87–5.11)
RDW: 14.8 % (ref 11.5–15.5)
WBC: 5.4 10*3/uL (ref 4.0–10.5)
nRBC: 0 % (ref 0.0–0.2)

## 2019-11-29 LAB — GLUCOSE, CAPILLARY
Glucose-Capillary: 65 mg/dL — ABNORMAL LOW (ref 70–99)
Glucose-Capillary: 69 mg/dL — ABNORMAL LOW (ref 70–99)
Glucose-Capillary: 82 mg/dL (ref 70–99)

## 2019-11-29 LAB — ABO/RH: ABO/RH(D): B NEG

## 2019-11-29 MED ORDER — KETOROLAC TROMETHAMINE 15 MG/ML IJ SOLN
15.0000 mg | Freq: Four times a day (QID) | INTRAMUSCULAR | Status: AC | PRN
  Administered 2019-11-29 – 2019-12-04 (×7): 15 mg via INTRAVENOUS
  Filled 2019-11-29 (×7): qty 1

## 2019-11-29 MED ORDER — POTASSIUM CHLORIDE 10 MEQ/100ML IV SOLN
10.0000 meq | INTRAVENOUS | Status: AC
  Administered 2019-11-29 (×6): 10 meq via INTRAVENOUS
  Filled 2019-11-29 (×6): qty 100

## 2019-11-29 MED ORDER — DEXTROSE-NACL 5-0.9 % IV SOLN: INTRAVENOUS | Status: AC

## 2019-11-29 NOTE — Progress Notes (Signed)
Patient stated that she was able to tolerate chicken broth yesterday with no issues related to her food allergies. Chicken broth ordered today for patient.

## 2019-11-29 NOTE — Progress Notes (Signed)
HEMATOLOGY-ONCOLOGY PROGRESS NOTE  SUBJECTIVE: The patient remains on clear liquids.  Had a low blood sugar this morning and took in some apple juice which made her have nausea.  She also reports that her stomach feels "bubbly."  She is not having any vomiting.  Denies constipation and diarrhea.  She reports that she feels weak.  Oncology History Overview Note  Cancer Staging Pancreatic cancer [facility]) Staging form: Exocrine Pancreas, AJCC 8th Edition - Clinical stage from 10/18/2019: Stage IV (cT3, cN1, cM1) - Signed by Truitt Merle, MD on 10/18/2019    Pancreatic cancer ([place])  09/19/2019 Imaging   CT AP w contrast 09/19/19  IMPRESSION: 1. Findings are highly concerning for probable primary pancreatic adenocarcinoma in the anterior aspect of the pancreatic head. Several prominent borderline enlarged lymph nodes are noted in the hepatoduodenal nodal station, and there are multiple indeterminate liver lesions which are highly concerning for probable hepatic metastases. Further evaluation with nonemergent abdominal MRI with and without IV gadolinium with MRCP is recommended in the near future to better evaluate these findings.   09/25/2019 Procedure   Upper EUS by Dr Paulita Fujita  IMPRESSION -There was no evidence of significant pathology in the left lobe of the liver.  -A few lymph nodes were visualized and measures in the peripancreatic region and porta hepata region.  -Hyperchoic material consistent with sludge was visualized endosonographically in the gallbladder.  -There was no sign of significant pathology in the common bile duct.  -A mass was identified in the pancreatic head. If biopsy results show adenocarcinoma, it would be staged T3N1Mx by endosonographic criteria. Fine needle aspiration performed.    09/25/2019 Initial Biopsy   FINAL MICROSCOPIC DIAGNOSIS: Fine needle aspirate, Pancreas;  MALIGNANT CELLS PRESENT CONSISTENT WITH ADENOCARCINOMA.    10/02/2019 Initial Diagnosis   Pancreatic  cancer ([place])   10/11/2019 Procedure   PAC placement y Dr Barry Dienes    10/15/2019 Imaging   CT Chest  IMPRESSION: 1. Small anterior left pneumothorax with dependent atelectasis in the left lower lobe. 2. Increased number of bilateral axillary and subpectoral lymph nodes with mild lymphadenopathy in the left axilla. While this would be an atypical presentation for metastatic pancreatic cancer, this possibility is not excluded 3. Main duct dilatation in the pancreas, better assessed on abdomen CT 09/19/2019.   10/16/2019 Imaging   MRI Abdomen  IMPRESSION: 1. Substantially motion degraded scan. 2. Probable persistent small anterior left lung base pneumothorax, better seen on chest CT from 1 day prior. 3. Poorly marginated hypoenhancing 3.7 x 2.9 cm pancreatic head mass, which appears to invade the anterior peripancreatic fat, compatible with known pancreatic adenocarcinoma. Diffuse irregular dilatation of the main pancreatic duct in the pancreatic body and tail. Mild narrowing of the main portal vein by the mass. Abdominal vasculature remains patent and otherwise uninvolved. 4. Numerous (greater than 10) small liver masses scattered throughout the liver, largest 1.0 cm, which appear to demonstrate targetoid enhancement on the limited motion degraded postcontrast sequences, compatible with liver metastases. 5. Mild porta hepatis adenopathy, suspicious for metastatic disease.   10/18/2019 Cancer Staging   Staging form: Exocrine Pancreas, AJCC 8th Edition - Clinical stage from 10/18/2019: Stage IV (cT3, cN1, cM1) - Signed by Truitt Merle, MD on 10/18/2019   10/24/2019 -  Chemotherapy   First-line FOLFIRINOX q2weeks starting 10/24/19   10/30/2019 Pathology Results   FINAL MICROSCOPIC DIAGNOSIS:   A. LIVER, LESION, BIOPSY:  -  Metastatic carcinoma  -  See comment   COMMENT:   By immunohistochemistry, the  neoplastic cells are positive for  cytokeratin 7 and GATA3 with patchy nonspecific  staining for PAX 8 but  negative for TTF-1, CDX2 and cytokeratin 20.  Overall, the findings are  consistent with metastasis of the patient's known breast carcinoma.  Prognostic panel (ER, PR, Her-2) is pending and will be reported in an  addendum.    ADDENDUM:   Dr. Laurence Ferrari notified us (November 01, 2019) that the patient was also  being worked up for a pancreatic mass.  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  In addition, ER and PR  are negative.  Gata-3 can be expressed in the pancreatic  adenocarcinomas; therefore, pancreatobiliary primary remains in the  differential.    11/02/2019 Genetic Testing   Negative genetic testing: no pathogenic variants detected in [facility] Common Hereditary Cancers Panel.  The report date is November 02, 2019.   The Common Hereditary Cancers Panel offered by [facility] includes sequencing and/or deletion duplication testing of the following 48 genes: APC, ATM, AXIN2, BARD1, BMPR1A, BRCA1, BRCA2, BRIP1, CDH1, CDK4, CDKN2A (p14ARF), CDKN2A (p16INK4a), CHEK2, CTNNA1, DICER1, EPCAM (Deletion/duplication testing only), GREM1 (promoter region deletion/duplication testing only), KIT, MEN1, MLH1, MSH2, MSH3, MSH6, MUTYH, NBN, NF1, NHTL1, PALB2, PDGFRA, PMS2, POLD1, POLE, PTEN, RAD50, RAD51C, RAD51D, RNF43, SDHB, SDHC, SDHD, SMAD4, SMARCA4. STK11, TP53, TSC1, TSC2, and VHL.  The following genes were evaluated for sequence changes only: SDHA and HOXB13 c.251G>A variant only.   11/04/2019 Imaging   CT AP  IMPRESSION: 1. Circumferential bowel wall thickening with adjacent fat stranding throughout the colon, most predominant in the transverse colon. This is consistent with colitis, which may be infectious or inflammatory in etiology. 2. Ill-defined pancreatic head mass consistent with known malignancy, similar to mildly increased in comparison to prior CT. There are innumerable hypodense masses throughout the liver, increased in  conspicuity in comparison to prior CT. Findings are worrisome for worsening metastatic disease. 3. Filling defect in the LEFT internal iliac vein, likely a thrombus with differential considerations including mixing artifact.      REVIEW OF SYSTEMS:   Constitutional: Denies fevers, chills  Respiratory: Denies cough, dyspnea or wheezes Cardiovascular: Denies palpitation, chest discomfort Gastrointestinal: Reports nausea but no vomiting  Skin: Denies abnormal skin rashes Lymphatics: Denies new lymphadenopathy or easy bruising Neurological:Denies numbness, tingling or new weaknesses Behavioral/Psych: Mood is stable, no new changes  Extremities: No lower extremity edema All other systems were reviewed with the patient and are negative.  I have reviewed the past medical history, past surgical history, social history and family history with the patient and they are unchanged from previous note.   PHYSICAL EXAMINATION: ECOG PERFORMANCE STATUS: 3  Vitals:   11/29/19 0303 11/29/19 0623  BP: (!) 157/84 (!) 149/82  Pulse: 75 72  Resp: 16 16  Temp: 98.1 F (36.7 C) 98.5 F (36.9 C)  SpO2: 100% 100%   Filed Weights   11/24/19 1332 11/24/19 1348 11/24/19 1921  Weight: 79.4 kg 79.4 kg 80.8 kg    Intake/Output from previous day: 11/03 0701 - 11/04 0700 In: 2699 [P.O.:760; I.V.:1521.2; Blood:417.8] Out: -   GENERAL:alert, no distress and comfortable SKIN: skin color, texture, turgor are normal, no rashes or significant lesions LUNGS: clear to auscultation and percussion with normal breathing effort HEART: regular rate & rhythm and no murmurs and no lower extremity edema ABDOMEN: Positive bowel sounds, soft, nontender Musculoskeletal:no cyanosis of digits and no clubbing  NEURO: alert & oriented x 3 with fluent speech, no  focal motor/sensory deficits  LABORATORY DATA:  I have reviewed the data as listed CMP Latest Ref Rng & Units 11/29/2019 11/28/2019 11/27/2019  Glucose 70 - 99  mg/dL 73 74 99  BUN 6 - 20 mg/dL <5(L) 7 7  Creatinine 0.44 - 1.00 mg/dL 0.51 0.57 0.62  Sodium 135 - 145 mmol/L 138 137 136  Potassium 3.5 - 5.1 mmol/L 3.2(L) 3.0(L) 3.7  Chloride 98 - 111 mmol/L 104 105 103  CO2 22 - 32 mmol/L '22 23 23  ' Calcium 8.9 - 10.3 mg/dL 8.8(L) 8.4(L) 8.9  Total Protein 6.5 - 8.1 g/dL 6.2(L) - -  Total Bilirubin 0.3 - 1.2 mg/dL 1.0 - -  Alkaline Phos 38 - 126 U/L 103 - -  AST 15 - 41 U/L 24 - -  ALT 0 - 44 U/L 22 - -    Lab Results  Component Value Date   WBC 5.4 11/29/2019   HGB 9.5 (L) 11/29/2019   HCT 27.8 (L) 11/29/2019   MCV 88.8 11/29/2019   PLT 174 11/29/2019   NEUTROABS 4.1 11/27/2019    DG Chest 2 View  Result Date: 11/24/2019 CLINICAL DATA:  Nausea and vomiting. EXAM: CHEST - 2 VIEW COMPARISON:  November 04, 2019 FINDINGS: There is stable left-sided venous Port-A-Cath positioning. The heart size and mediastinal contours are within normal limits. Both lungs are clear. The visualized skeletal structures are unremarkable. IMPRESSION: No active cardiopulmonary disease. Electronically Signed   By: Virgina Norfolk M.D.   On: 11/24/2019 18:52   CT Abdomen Pelvis W Contrast  Result Date: 11/24/2019 CLINICAL DATA:  Abdominal distension, pain, nausea, vomiting. Pancreatic cancer EXAM: CT ABDOMEN AND PELVIS WITH CONTRAST TECHNIQUE: Multidetector CT imaging of the abdomen and pelvis was performed using the standard protocol following bolus administration of intravenous contrast. CONTRAST:  14m OMNIPAQUE IOHEXOL 300 MG/ML  SOLN COMPARISON:  11/04/2019 FINDINGS: Lower chest: Scarring at the left lung base.  No acute abnormality. Hepatobiliary: Numerous lesions throughout the liver, enlarging since prior study. Index right hepatic dome lesion measures 2.1 cm compared to 1.6 cm previously. Inferior right hepatic lesion measures 1.4 cm compared with 9 mm previously. Gallbladder unremarkable. Pancreas: Pancreatic head mass measuring up to 3.4 cm compared to 3.2  cm previously, likely not significantly changed. Pancreatic ductal dilatation in the body and tail. Spleen: No focal abnormality.  Normal size. Adrenals/Urinary Tract: No adrenal abnormality. No focal renal abnormality. No stones or hydronephrosis. Urinary bladder is unremarkable. Stomach/Bowel: Stomach, large and small bowel grossly unremarkable. Normal appendix. Vascular/Lymphatic: No evidence of aneurysm or adenopathy. Reproductive: No mass or abnormality noted. Other: No free fluid or free air. Musculoskeletal: No acute bony abnormality. IMPRESSION: Pancreatic head mass again noted compatible with patient's given history of pancreatic cancer. Numerous metastases throughout the liver, enlarging since prior study. Electronically Signed   By: KRolm BaptiseM.D.   On: 11/24/2019 17:02   CT ABDOMEN PELVIS W CONTRAST  Result Date: 11/04/2019 CLINICAL DATA:  Nausea and vomiting, history of pancreatic cancer EXAM: CT ABDOMEN AND PELVIS WITH CONTRAST TECHNIQUE: Multidetector CT imaging of the abdomen and pelvis was performed using the standard protocol following bolus administration of intravenous contrast. CONTRAST:  1020mOMNIPAQUE IOHEXOL 300 MG/ML  SOLN COMPARISON:  September 19, 2019. FINDINGS: Lower chest: No acute abnormality.  LEFT basilar atelectasis. Hepatobiliary: There are innumerable hypodense masses throughout the liver, increased in conspicuity in comparison to prior study. Representative mass of the hepatic dome measures 16 mm, previously 7 mm (series 2, image 8). Newly  conspicuous mass of the RIGHT inferior liver measures 12 mm (series 2, image 28). Gallbladder is unremarkable. There is mild central biliary ductal prominence. Pancreas: Ill-defined pancreatic mass of the pancreatic head measures approximately 3.2 x 1.9 by 3.3 cm, similar to mildly increased in comparison to prior. There is upstream pancreatic ductal dilation, unchanged in comparison to prior. This mass directly abuts the duodenum. There  is adjacent fat stranding along the head of the pancreas, similar in comparison to prior. Spleen: Unremarkable. Adrenals/Urinary Tract: Adrenals are unremarkable. Kidneys enhance symmetrically. No hydronephrosis. Bladder is unremarkable. Stomach/Bowel: No evidence of bowel obstruction. There is circumferential bowel wall thickening with adjacent fat stranding throughout the colon. This is most predominant in the transverse colon. Appendix is unremarkable. Vascular/Lymphatic: Peripancreatic lymph node measures 9 mm, unchanged (series 2, image 28). There is a filling defect in the LEFT internal iliac vein. Minimal atherosclerotic calcifications. Aortic branches are patent. Reproductive: Status post hysterectomy. Other: No free air. Musculoskeletal: No acute or significant osseous findings. IMPRESSION: 1. Circumferential bowel wall thickening with adjacent fat stranding throughout the colon, most predominant in the transverse colon. This is consistent with colitis, which may be infectious or inflammatory in etiology. 2. Ill-defined pancreatic head mass consistent with known malignancy, similar to mildly increased in comparison to prior CT. There are innumerable hypodense masses throughout the liver, increased in conspicuity in comparison to prior CT. Findings are worrisome for worsening metastatic disease. 3. Filling defect in the LEFT internal iliac vein, likely a thrombus with differential considerations including mixing artifact. Electronically Signed   By: [name] MD   On: 11/04/2019 19:10   US BIOPSY (LIVER)  Result Date: 10/30/2019 INDICATION: 59 year old female with a history of pancreatic cancer and small hepatic lesions concerning for metastatic disease. EXAM: ULTRASOUND BIOPSY CORE LIVER MEDICATIONS: None. ANESTHESIA/SEDATION: Moderate (conscious) sedation was employed during this procedure. A total of Versed 4 mg and Fentanyl 100 mcg was administered intravenously. Moderate Sedation Time: 14  minutes. The patient's level of consciousness and vital signs were monitored continuously by radiology nursing throughout the procedure under my direct supervision. FLUOROSCOPY TIME:  None. COMPLICATIONS: None immediate. PROCEDURE: Informed written consent was obtained from the patient after a thorough discussion of the procedural risks, benefits and alternatives. All questions were addressed. Maximal Sterile Barrier Technique was utilized including caps, mask, sterile gowns, sterile gloves, sterile drape, hand hygiene and skin antiseptic. A timeout was performed prior to the initiation of the procedure. Ultrasound was used to interrogate the liver. There are multiple small hypoechoic solid lesions scattered throughout the liver. A suitable lesion in the right hemi liver was identified. A skin entry site was selected and marked. The overlying skin was sterilely prepped and draped in the standard fashion using chlorhexidine skin prep. Local anesthesia was attained by infiltration with 1% lidocaine. A small dermatotomy was made. Under real-time ultrasound guidance, a 17 gauge introducer needle was advanced through the liver and positioned at the margin of the lesion. Multiple 18 gauge core biopsies were then obtained coaxially using the bio Pince automated biopsy device. Biopsy specimens were placed in formalin and delivered to pathology for further analysis. As the introducer needle was removed, the biopsy tract was embolized with a Gel-Foam slurry. Post biopsy ultrasound images demonstrate no evidence of hematoma or immediate complication. The patient tolerated the procedure well. IMPRESSION: Technically successful ultrasound-guided core biopsy of liver lesion. Signed, Criselda Peaches, MD, [facility] Electronically Signed   By: Myrle Sheng  Laurence Ferrari M.D.   On: 10/30/2019 15:34   DG Chest Port 1 View  Result Date: 11/04/2019 CLINICAL DATA:  Nausea,  vomiting and diarrhea since chemotherapy 2 weeks prior, history of pancreatic cancer EXAM: PORTABLE CHEST 1 VIEW COMPARISON:  Radiograph 10/28/2019 FINDINGS: Accessed left subclavian approach Port-A-Cath tip terminates at the superior cavoatrial junction in similar position to prior. Telemetry leads overlie the chest. Stable streaky opacities in the left lung base likely reflecting scarring or subsegmental atelectasis, less likely infection. No new consolidative opacity. No pneumothorax or effusion. The cardiomediastinal contours are unremarkable. No acute osseous or soft tissue abnormality. IMPRESSION: Stable streaky opacities in the left lung base likely reflecting scarring or subsegmental atelectasis, less likely infection. No new acute cardiopulmonary abnormality. Electronically Signed   By: Lovena Le M.D.   On: 11/04/2019 20:59   VAS [place] LOWER EXTREMITY VENOUS (DVT) ([facility] and [facility] 7a-7p)  Result Date: 11/05/2019  Lower Venous DVT Study Indications: CT Abdomen filling defect.  Risk Factors: Cancer. Comparison Study: No prior studies. Performing Technologist: Oliver Hum RVT  Examination Guidelines: A complete evaluation includes B-mode imaging, spectral Doppler, color Doppler, and power Doppler as needed of all accessible portions of each vessel. Bilateral testing is considered an integral part of a complete examination. Limited examinations for reoccurring indications may be performed as noted. The reflux portion of the exam is performed with the patient in reverse Trendelenburg.  +-----+---------------+---------+-----------+----------+--------------+ RIGHTCompressibilityPhasicitySpontaneityPropertiesThrombus Aging +-----+---------------+---------+-----------+----------+--------------+ CFV  Full           Yes      Yes                                 +-----+---------------+---------+-----------+----------+--------------+    +---------+---------------+---------+-----------+----------+--------------+ LEFT     CompressibilityPhasicitySpontaneityPropertiesThrombus Aging +---------+---------------+---------+-----------+----------+--------------+ CFV      Full           Yes      Yes                                 +---------+---------------+---------+-----------+----------+--------------+ SFJ      Full                                                        +---------+---------------+---------+-----------+----------+--------------+ FV Prox  Full                                                        +---------+---------------+---------+-----------+----------+--------------+ FV Mid   Full                                                        +---------+---------------+---------+-----------+----------+--------------+ FV DistalFull                                                        +---------+---------------+---------+-----------+----------+--------------+  PFV      Full                                                        +---------+---------------+---------+-----------+----------+--------------+ POP      Full           Yes      Yes                                 +---------+---------------+---------+-----------+----------+--------------+ PTV      Full                                                        +---------+---------------+---------+-----------+----------+--------------+ PERO     Full                                                        +---------+---------------+---------+-----------+----------+--------------+ EIV                     Yes      Yes                                 +---------+---------------+---------+-----------+----------+--------------+ CIV                                                   Not visualized +---------+---------------+---------+-----------+----------+--------------+     Summary: RIGHT: - No evidence of common  femoral vein obstruction.  LEFT: - There is no evidence of deep vein thrombosis in the lower extremity.  - No cystic structure found in the popliteal fossa.  *See table(s) above for measurements and observations. Electronically signed by Monica Martinez MD on 11/05/2019 at 5:21:58 PM.    Final     ASSESSMENT AND PLAN: 1.  Metastatic pancreatic adenocarcinoma 2.  Intractable nausea and vomiting secondary to chemotherapy, recovering slowly  3.  Dehydration, improved 4.  Anemia secondary to chemotherapy 5.  Left iliac vein DVT 6.  Lupus 7.  Hypertension 8.  Migraines 9.  Anxiety 10.  Hypoglycemia due to poor p.o. intake  -Continue current antiemetics.  Olanzapine was added at low-dose last evening.  We plan to continue this. -Continue clear liquids for now.  She is not taking much in by mouth.  When she is taking in more and not having significant nausea or vomiting, can advance diet. -Change normal saline to D5 normal saline due to hypoglycemia. -Status post 1 unit PRBCs 11/3 with improvement of her hemoglobin.  Monitor for now.    LOS: 4 days     Mikey Bussing  11/29/2019

## 2019-11-29 NOTE — Progress Notes (Signed)
PROGRESS NOTE    Cynthia Frye  FKC:[id number] DOB: 30-Jul-1960 DOA: 11/24/2019 PCP: Josetta Huddle, MD    Brief Narrative:  59 year old unfortunate female history of recently diagnosed metastatic pancreatic adenocarcinoma with mets to the liver 09/19/2019, currently undergoing chemotherapy, recent chemotherapy 11/21/2019 presenting with 1 day history of worsening nausea, vomiting, abdominal pain.  Noted to have significant hypokalemia with inability to keep anything down.  CT abdomen and pelvis negative for small bowel obstruction.  Urinalysis concerning for UTI.  Patient started on IV antibiotics, scheduled IV antiemetics, IV fluids, potassium repletion.  Assessment & Plan:   Principal Problem:   Hypokalemia Active Problems:   Essential hypertension   Lupus (systemic lupus erythematosus) (HCC)   Seasonal allergies   Chronic migraine without aura without status migrainosus, not intractable   Pancreatic cancer (HCC)   Acute deep vein thrombosis of left iliac vein (HCC)   Nausea and vomiting in adult   Dehydration   Prolonged QT interval   Emesis   Tachycardia   Anemia associated with chemotherapy   Constipation   Generalized abdominal pain   Malnutrition of moderate degree   1 Hypokalemia Secondary to GI losses from nausea and emesis postchemotherapy. Magnesium level at 1.7 on admission.  Potassium was 2.7 on admission. Potassium remains low. Will replace Repeat bmet in AM  2. Nausea and vomiting Likely chemotherapy induced. Patient with recent chemotherapy 11/21/2019. Patient noted to have had similar episodes recently after first cycle of chemotherapy with nausea vomiting and diarrhea and had to be hospitalized from 11/04/2019-11/09/2019 in addition to colitis.    Patient on admission initially placed on scheduled IV Ativan, IV Decadron, Zofran as needed with improvement with nausea and emesis.   Continue IV Decadron.  Continue as needed Zofran.  Continue IV fluids.    Cont with clears for now with eventual plan to advance diet when pt feels better Will ensure adequate anti-emetic as needed  3. Dehydration Continue IV fluids and normal saline 100 cc/h.   Continue with clear liquids as tolerated Recheck bmet in AM  4. Tachycardia Likely secondary to volume depletion, nausea vomiting, possible beta-blockade withdrawal as patient unable to keep medications down.   Resolved. Continue hydration with IV fluids as tolerated Continue Lopressor as tolerated  5. Prolonged QT interval Repeat EKG with resolution of QTC prolongation.  Keep magnesium >2, potassium >4.  Avoid QTC prolongation medications.  Supportive care.  Follow.  6. Pancreatic adenocarcinoma in the head, with multiple liver lesionslikely liver metastases Recently diagnosed per CT scan on 09/19/2019, EUS biopsy per Dr. Paulita Fujita 09/25/2019 consistent with pancreatic adenocarcinoma. Patient status post cycle 1 FOLFIRINOX 10/24/2019. Patient stated started cycle 2 of chemotherapy 11/21/2019. Patient noted per med rec to be on Decadron 4 mg daily after chemotherapy for 3 to 5 days and as such due to nausea and vomiting patient started on Decadron 4 mg IV daily x3 days.   Oncology, Dr. Burr Medico is following. Greatly appreciate assistance by Oncology Continue with analgesic and antiemetics as tolerated  7. Recent left leg vein DVT Continued on full dose Lovenox.   -No evidence of acute blood loss  8. Upper abdominal cramps -Likely secondary to metastatic pancreatic adenocarcinoma.    Continue to ensure regular bowel movements given concurrent analgesics Continue with analgesia as needed Pt is continued on PPI  9. Lupus Being followed in the outpatient setting by rheumatologist, Dr. Trudie Reed on weekly methotrexate injections, Plaquenil and diclofenac.  Recommend f/u with Rheumatology as outpatient when pt is ultimately  discharged  10. Probable UTI Urinalysis with moderate leukocytes,  nitrite negative, rare bacteria, 21-50 WBCs.   Urine cx remains pending Pt has completed 3 days IV Rocephin  11. Anxiety Resume scheduled IV Ativan due to nausea and emesis.  Once tolerating oral intake could likely transition to oral Ativan as needed.  Continue supportive care.   12. Hypertension Patient still with nausea and emesis.   Continue IV Lopressor while pt is still nauseated Once tolerating oral intake could transition back to home regimen oral antihypertensive medications.   13.  Constipation Patient with large bowel movement RN after soapsuds enema was given. Continue with cathartics as needed  14.  Anemia of chronic disease/chemotherapy-induced anemia Likely chemo induced.  Patient denies any overt bleeding.  Consistent with anemia of chronic disease.   -Hgb was down to 7.2 on 11/3, given 1 unit PRBC -Hgb this AM now 9.5  DVT prophylaxis: Lovenox Code Status: Full Family Communication: Pt in room  Status is: Inpatient  Remains inpatient appropriate because:IV treatments appropriate due to intensity of illness or inability to take PO   Dispo: The patient is from: Home              Anticipated d/c is to: Home              Anticipated d/c date is: 3 days              Patient currently is not medically stable to d/c.  Consultants:   Oncology  Procedures:     Antimicrobials: Anti-infectives (From admission, onward)   Start     Dose/Rate Route Frequency Ordered Stop   11/24/19 2000  cefTRIAXone (ROCEPHIN) 1 g in sodium chloride 0.9 % 100 mL IVPB  Status:  Discontinued        1 g 200 mL/hr over 30 Minutes Intravenous Every 24 hours 11/24/19 1912 11/27/19 0759      Subjective: Still reporting feeling more tired and drowsy. Not very hungry this AM  Objective: Vitals:   11/29/19 0054 11/29/19 0303 11/29/19 0623 11/29/19 1351  BP: (!) 144/75 (!) 157/84 (!) 149/82 (!) 152/81  Pulse: 86 75 72 76  Resp: 18 16 16 16   Temp: 98.4 F (36.9 C) 98.1 F  (36.7 C) 98.5 F (36.9 C) 99.3 F (37.4 C)  TempSrc: Oral Oral Oral Oral  SpO2: 100% 100% 100% 100%  Weight:      Height:        Intake/Output Summary (Last 24 hours) at 11/29/2019 1649 Last data filed at 11/29/2019 1354 Gross per 24 hour  Intake 2654.02 ml  Output --  Net 2654.02 ml   Filed Weights   11/24/19 1332 11/24/19 1348 11/24/19 1921  Weight: 79.4 kg 79.4 kg 80.8 kg    Examination: General exam: Awake, laying in bed, in nad, appears drowsy Respiratory system: Normal respiratory effort, no wheezing Cardiovascular system: regular rate, s1, s2 Gastrointestinal system: Soft, nondistended, positive BS Central nervous system: CN2-12 grossly intact, strength intact Extremities: Perfused, no clubbing Skin: Normal skin turgor, no notable skin lesions seen Psychiatry: Mood normal // no visual hallucinations   Data Reviewed: I have personally reviewed following labs and imaging studies  CBC: Recent Labs  Lab 11/24/19 1427 11/24/19 1427 11/25/19 0626 11/26/19 0624 11/27/19 0555 11/28/19 0601 11/29/19 0603  WBC 5.6   < > 6.2 5.2 5.0 3.9* 5.4  NEUTROABS 4.7  --   --  4.3 4.1  --   --   HGB 8.2*   < >  7.5* 7.6* 7.8* 7.2* 9.5*  HCT 25.0*   < > 23.5* 23.1* 22.8* 21.9* 27.8*  MCV 90.9   < > 93.3 92.0 88.7 90.1 88.8  PLT 160   < > 151 152 176 173 174   < > = values in this interval not displayed.   Basic Metabolic Panel: Recent Labs  Lab 11/24/19 1427 11/24/19 1504 11/25/19 0626 11/26/19 0624 11/27/19 0555 11/28/19 0601 11/29/19 0603  NA   < >  --  139 136 136 137 138  K   < >  --  4.6 3.7 3.7 3.0* 3.2*  CL   < >  --  106 104 103 105 104  CO2   < >  --  25 23 23 23 22   GLUCOSE   < >  --  96 98 99 74 73  BUN   < >  --  7 7 7 7  <5*  CREATININE   < >  --  0.55 0.57 0.62 0.57 0.51  CALCIUM   < >  --  8.8* 9.0 8.9 8.4* 8.8*  MG  --  1.7 2.3 1.9 1.7 2.6*  --   PHOS  --   --  3.1  --   --  2.5  --    < > = values in this interval not displayed.   GFR: Estimated  Creatinine Clearance: 83.9 mL/min (by C-G formula based on SCr of 0.51 mg/dL). Liver Function Tests: Recent Labs  Lab 11/25/19 0626 11/26/19 0624 11/28/19 0601 11/29/19 0603  AST 24 21  --  24  ALT 27 24  --  22  ALKPHOS 88 87  --  103  BILITOT 1.0 0.7  --  1.0  PROT 6.4* 6.4*  --  6.2*  ALBUMIN 3.0* 3.0* 2.8* 3.0*   No results for input(s): LIPASE, AMYLASE in the last 168 hours. No results for input(s): AMMONIA in the last 168 hours. Coagulation Profile: No results for input(s): INR, PROTIME in the last 168 hours. Cardiac Enzymes: No results for input(s): CKTOTAL, CKMB, CKMBINDEX, TROPONINI in the last 168 hours. BNP (last 3 results) No results for input(s): PROBNP in the last 8760 hours. HbA1C: No results for input(s): HGBA1C in the last 72 hours. CBG: Recent Labs  Lab 11/27/19 0534 11/28/19 0655 11/29/19 0626 11/29/19 0802 11/29/19 1026  GLUCAP 99 74 65* 69* 82   Lipid Profile: No results for input(s): CHOL, HDL, LDLCALC, TRIG, CHOLHDL, LDLDIRECT in the last 72 hours. Thyroid Function Tests: No results for input(s): TSH, T4TOTAL, FREET4, T3FREE, THYROIDAB in the last 72 hours. Anemia Panel: No results for input(s): VITAMINB12, FOLATE, FERRITIN, TIBC, IRON, RETICCTPCT in the last 72 hours. Sepsis Labs: No results for input(s): PROCALCITON, LATICACIDVEN in the last 168 hours.  Recent Results (from the past 240 hour(s))  Respiratory Panel by RT PCR (Flu A&B, Covid) - Nasopharyngeal Swab     Status: None   Collection Time: 11/24/19  3:34 PM   Specimen: Nasopharyngeal Swab  Result Value Ref Range Status   SARS Coronavirus 2 by RT PCR NEGATIVE NEGATIVE Final    Comment: (NOTE) SARS-CoV-2 target nucleic acids are NOT DETECTED.  The SARS-CoV-2 RNA is generally detectable in upper respiratoy specimens during the acute phase of infection. The lowest concentration of SARS-CoV-2 viral copies this assay can detect is 131 copies/mL. A negative result does not preclude  SARS-Cov-2 infection and should not be used as the sole basis for treatment or other patient management decisions. A negative result  may occur with  improper specimen collection/handling, submission of specimen other than nasopharyngeal swab, presence of viral mutation(s) within the areas targeted by this assay, and inadequate number of viral copies (<131 copies/mL). A negative result must be combined with clinical observations, patient history, and epidemiological information. The expected result is Negative.  Fact Sheet for Patients:  PinkCheek.be  Fact Sheet for Healthcare Providers:  GravelBags.it  This test is no t yet approved or cleared by the [place] FDA and  has been authorized for detection and/or diagnosis of SARS-CoV-2 by FDA under an Emergency Use Authorization (EUA). This EUA will remain  in effect (meaning this test can be used) for the duration of the COVID-19 declaration under Section 564(b)(1) of the Act, 21 U.S.C. section 360bbb-3(b)(1), unless the authorization is terminated or revoked sooner.     Influenza A by PCR NEGATIVE NEGATIVE Final   Influenza B by PCR NEGATIVE NEGATIVE Final    Comment: (NOTE) The Xpert Xpress SARS-CoV-2/FLU/RSV assay is intended as an aid in  the diagnosis of influenza from Nasopharyngeal swab specimens and  should not be used as a sole basis for treatment. Nasal washings and  aspirates are unacceptable for Xpert Xpress SARS-CoV-2/FLU/RSV  testing.  Fact Sheet for Patients: PinkCheek.be  Fact Sheet for Healthcare Providers: GravelBags.it  This test is not yet approved or cleared by the [place] FDA and  has been authorized for detection and/or diagnosis of SARS-CoV-2 by  FDA under an Emergency Use Authorization (EUA). This EUA will remain  in effect (meaning this test can be used) for the duration of the    Covid-19 declaration under Section 564(b)(1) of the Act, 21  U.S.C. section 360bbb-3(b)(1), unless the authorization is  terminated or revoked. Performed at [facility], [place] [address], [place], [place] [address]      Radiology Studies: No results found.  Scheduled Meds: . sodium chloride   Intravenous Once  . Chlorhexidine Gluconate Cloth  6 each Topical Daily  . enoxaparin  120 mg Subcutaneous Q24H  . feeding supplement  237 mL Oral BID BM  . fluticasone  2 spray Each Nare Daily  . metoprolol tartrate  5 mg Intravenous Q8H  . OLANZapine  2.5 mg Oral QHS  . pantoprazole (PROTONIX) IV  40 mg Intravenous Q24H  . polyethylene glycol  17 g Oral Daily  . senna-docusate  1 tablet Oral BID  . sodium chloride flush  3 mL Intravenous Q12H  . topiramate  50 mg Oral QHS   Continuous Infusions: . dextrose 5 % and 0.9% NaCl 100 mL/hr at 11/29/19 0804     LOS: 4 days   Marylu Lund, MD [facility] Pager On Amion  If 7PM-7AM, please contact night-coverage 11/29/2019, 4:49 PM

## 2019-11-30 DIAGNOSIS — E876 Hypokalemia: Secondary | ICD-10-CM | POA: Diagnosis not present

## 2019-11-30 DIAGNOSIS — D6481 Anemia due to antineoplastic chemotherapy: Secondary | ICD-10-CM | POA: Diagnosis not present

## 2019-11-30 DIAGNOSIS — T451X5A Adverse effect of antineoplastic and immunosuppressive drugs, initial encounter: Secondary | ICD-10-CM | POA: Diagnosis not present

## 2019-11-30 DIAGNOSIS — K5900 Constipation, unspecified: Secondary | ICD-10-CM | POA: Diagnosis not present

## 2019-11-30 LAB — COMPREHENSIVE METABOLIC PANEL
ALT: 20 U/L (ref 0–44)
AST: 32 U/L (ref 15–41)
Albumin: 2.9 g/dL — ABNORMAL LOW (ref 3.5–5.0)
Alkaline Phosphatase: 91 U/L (ref 38–126)
Anion gap: 9 (ref 5–15)
BUN: <5 mg/dL — ABNORMAL LOW (ref 6–20)
CO2: 26 mmol/L (ref 22–32)
Calcium: 8.7 mg/dL — ABNORMAL LOW (ref 8.9–10.3)
Chloride: 100 mmol/L (ref 98–111)
Creatinine, Ser: 0.53 mg/dL (ref 0.44–1.00)
GFR, Estimated: >60 mL/min (ref >60)
Glucose, Bld: 108 mg/dL — ABNORMAL HIGH (ref 70–99)
Potassium: 3 mmol/L — ABNORMAL LOW (ref 3.5–5.1)
Sodium: 135 mmol/L (ref 135–145)
Total Bilirubin: 0.9 mg/dL (ref 0.3–1.2)
Total Protein: 6.2 g/dL — ABNORMAL LOW (ref 6.5–8.1)

## 2019-11-30 LAB — CBC
HCT: 28.8 % — ABNORMAL LOW (ref 36.0–46.0)
Hemoglobin: 9.6 g/dL — ABNORMAL LOW (ref 12.0–15.0)
MCH: 30 pg (ref 26.0–34.0)
MCHC: 33.3 g/dL (ref 30.0–36.0)
MCV: 90 fL (ref 80.0–100.0)
Platelets: 188 10*3/uL (ref 150–400)
RBC: 3.2 MIL/uL — ABNORMAL LOW (ref 3.87–5.11)
RDW: 15.7 % — ABNORMAL HIGH (ref 11.5–15.5)
WBC: 4.1 10*3/uL (ref 4.0–10.5)
nRBC: 0 % (ref 0.0–0.2)

## 2019-11-30 LAB — BPAM RBC
Blood Product Expiration Date: 202111292359
ISSUE DATE / TIME: 202111040030
Unit Type and Rh: 1700

## 2019-11-30 LAB — TYPE AND SCREEN
ABO/RH(D): B NEG
Antibody Screen: NEGATIVE
Unit division: 0

## 2019-11-30 LAB — MAGNESIUM: Magnesium: 1.5 mg/dL — ABNORMAL LOW (ref 1.7–2.4)

## 2019-11-30 LAB — GLUCOSE, CAPILLARY: Glucose-Capillary: 117 mg/dL — ABNORMAL HIGH (ref 70–99)

## 2019-11-30 MED ORDER — MAGNESIUM SULFATE 4 GM/100ML IV SOLN
4.0000 g | Freq: Once | INTRAVENOUS | Status: AC
  Administered 2019-11-30: 4 g via INTRAVENOUS
  Filled 2019-11-30: qty 100

## 2019-11-30 MED ORDER — METOCLOPRAMIDE HCL 5 MG/ML IJ SOLN
5.0000 mg | Freq: Three times a day (TID) | INTRAMUSCULAR | Status: DC | PRN
  Administered 2019-12-02 – 2019-12-05 (×6): 5 mg via INTRAVENOUS
  Filled 2019-11-30 (×8): qty 2

## 2019-11-30 MED ORDER — METOCLOPRAMIDE HCL 5 MG/ML IJ SOLN
5.0000 mg | Freq: Three times a day (TID) | INTRAMUSCULAR | Status: DC
  Administered 2019-11-30: 5 mg via INTRAVENOUS
  Filled 2019-11-30: qty 2

## 2019-11-30 MED ORDER — POTASSIUM CHLORIDE 10 MEQ/100ML IV SOLN
10.0000 meq | INTRAVENOUS | Status: AC
  Administered 2019-11-30 (×6): 10 meq via INTRAVENOUS
  Filled 2019-11-30 (×5): qty 100

## 2019-11-30 MED ORDER — POTASSIUM CHLORIDE 10 MEQ/50ML IV SOLN
10.0000 meq | INTRAVENOUS | Status: DC
  Administered 2019-11-30: 10 meq via INTRAVENOUS
  Filled 2019-11-30 (×4): qty 50

## 2019-11-30 NOTE — Progress Notes (Signed)
PT Cancellation Note  Patient Details Name: Cynthia Frye MRN: [id number] DOB: [birth year]   Cancelled Treatment:     Issues with nausea are on/off.  When I arrived to unit pt was sleeping so comfortably so did not disturb.     Rica Koyanagi  PTA Acute  Rehabilitation Services Pager      [phone] Office      [phone]

## 2019-11-30 NOTE — Progress Notes (Signed)
HEMATOLOGY-ONCOLOGY PROGRESS NOTE  SUBJECTIVE: Pt still has intermittent N/V, on clear liquid, fatigued, did get out of bed today, but remains to be in bed most of time.   Oncology History Overview Note  Cancer Staging Pancreatic cancer [facility]) Staging form: Exocrine Pancreas, AJCC 8th Edition - Clinical stage from 10/18/2019: Stage IV (cT3, cN1, cM1) - Signed by Truitt Merle, MD on 10/18/2019    Pancreatic cancer ([place])  09/19/2019 Imaging   CT AP w contrast 09/19/19  IMPRESSION: 1. Findings are highly concerning for probable primary pancreatic adenocarcinoma in the anterior aspect of the pancreatic head. Several prominent borderline enlarged lymph nodes are noted in the hepatoduodenal nodal station, and there are multiple indeterminate liver lesions which are highly concerning for probable hepatic metastases. Further evaluation with nonemergent abdominal MRI with and without IV gadolinium with MRCP is recommended in the near future to better evaluate these findings.   09/25/2019 Procedure   Upper EUS by Dr Paulita Fujita  IMPRESSION -There was no evidence of significant pathology in the left lobe of the liver.  -A few lymph nodes were visualized and measures in the peripancreatic region and porta hepata region.  -Hyperchoic material consistent with sludge was visualized endosonographically in the gallbladder.  -There was no sign of significant pathology in the common bile duct.  -A mass was identified in the pancreatic head. If biopsy results show adenocarcinoma, it would be staged T3N1Mx by endosonographic criteria. Fine needle aspiration performed.    09/25/2019 Initial Biopsy   FINAL MICROSCOPIC DIAGNOSIS: Fine needle aspirate, Pancreas;  MALIGNANT CELLS PRESENT CONSISTENT WITH ADENOCARCINOMA.    10/02/2019 Initial Diagnosis   Pancreatic cancer ([place])   10/11/2019 Procedure   PAC placement y Dr Barry Dienes    10/15/2019 Imaging   CT Chest  IMPRESSION: 1. Small anterior left pneumothorax with  dependent atelectasis in the left lower lobe. 2. Increased number of bilateral axillary and subpectoral lymph nodes with mild lymphadenopathy in the left axilla. While this would be an atypical presentation for metastatic pancreatic cancer, this possibility is not excluded 3. Main duct dilatation in the pancreas, better assessed on abdomen CT 09/19/2019.   10/16/2019 Imaging   MRI Abdomen  IMPRESSION: 1. Substantially motion degraded scan. 2. Probable persistent small anterior left lung base pneumothorax, better seen on chest CT from 1 day prior. 3. Poorly marginated hypoenhancing 3.7 x 2.9 cm pancreatic head mass, which appears to invade the anterior peripancreatic fat, compatible with known pancreatic adenocarcinoma. Diffuse irregular dilatation of the main pancreatic duct in the pancreatic body and tail. Mild narrowing of the main portal vein by the mass. Abdominal vasculature remains patent and otherwise uninvolved. 4. Numerous (greater than 10) small liver masses scattered throughout the liver, largest 1.0 cm, which appear to demonstrate targetoid enhancement on the limited motion degraded postcontrast sequences, compatible with liver metastases. 5. Mild porta hepatis adenopathy, suspicious for metastatic disease.   10/18/2019 Cancer Staging   Staging form: Exocrine Pancreas, AJCC 8th Edition - Clinical stage from 10/18/2019: Stage IV (cT3, cN1, cM1) - Signed by Truitt Merle, MD on 10/18/2019   10/24/2019 -  Chemotherapy   First-line FOLFIRINOX q2weeks starting 10/24/19   10/30/2019 Pathology Results   FINAL MICROSCOPIC DIAGNOSIS:   A. LIVER, LESION, BIOPSY:  -  Metastatic carcinoma  -  See comment   COMMENT:   By immunohistochemistry, the neoplastic cells are positive for  cytokeratin 7 and GATA3 with patchy nonspecific staining for PAX 8 but  negative for TTF-1, CDX2 and cytokeratin 20.  Overall,  the findings are  consistent with metastasis of the patient's known breast  carcinoma.  Prognostic panel (ER, PR, Her-2) is pending and will be reported in an  addendum.    ADDENDUM:   Dr. Laurence Ferrari notified us (November 01, 2019) that the patient was also  being worked up for a pancreatic mass.  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  In addition, ER and PR  are negative.  Gata-3 can be expressed in the pancreatic  adenocarcinomas; therefore, pancreatobiliary primary remains in the  differential.    11/02/2019 Genetic Testing   Negative genetic testing: no pathogenic variants detected in [facility] Common Hereditary Cancers Panel.  The report date is November 02, 2019.   The Common Hereditary Cancers Panel offered by [facility] includes sequencing and/or deletion duplication testing of the following 48 genes: APC, ATM, AXIN2, BARD1, BMPR1A, BRCA1, BRCA2, BRIP1, CDH1, CDK4, CDKN2A (p14ARF), CDKN2A (p16INK4a), CHEK2, CTNNA1, DICER1, EPCAM (Deletion/duplication testing only), GREM1 (promoter region deletion/duplication testing only), KIT, MEN1, MLH1, MSH2, MSH3, MSH6, MUTYH, NBN, NF1, NHTL1, PALB2, PDGFRA, PMS2, POLD1, POLE, PTEN, RAD50, RAD51C, RAD51D, RNF43, SDHB, SDHC, SDHD, SMAD4, SMARCA4. STK11, TP53, TSC1, TSC2, and VHL.  The following genes were evaluated for sequence changes only: SDHA and HOXB13 c.251G>A variant only.   11/04/2019 Imaging   CT AP  IMPRESSION: 1. Circumferential bowel wall thickening with adjacent fat stranding throughout the colon, most predominant in the transverse colon. This is consistent with colitis, which may be infectious or inflammatory in etiology. 2. Ill-defined pancreatic head mass consistent with known malignancy, similar to mildly increased in comparison to prior CT. There are innumerable hypodense masses throughout the liver, increased in conspicuity in comparison to prior CT. Findings are worrisome for worsening metastatic disease. 3. Filling defect in the LEFT internal iliac vein, likely a  thrombus with differential considerations including mixing artifact.      REVIEW OF SYSTEMS:   Constitutional: Denies fevers, chills  Respiratory: Denies cough, dyspnea or wheezes Cardiovascular: Denies palpitation, chest discomfort Gastrointestinal: Reports abdominal fullness this morning.  Denies nausea and vomiting.  Denies diarrhea.  Denies abdominal cramping. Skin: Denies abnormal skin rashes Lymphatics: Denies new lymphadenopathy or easy bruising Neurological:Denies numbness, tingling or new weaknesses Behavioral/Psych: Mood is stable, no new changes  Extremities: No lower extremity edema All other systems were reviewed with the patient and are negative.  I have reviewed the past medical history, past surgical history, social history and family history with the patient and they are unchanged from previous note.   PHYSICAL EXAMINATION: ECOG PERFORMANCE STATUS: 3  Vitals:   11/30/19 1337 11/30/19 1527  BP: (!) 145/80 (!) 145/84  Pulse: 80 80  Resp: 16 15  Temp: 98.1 F (36.7 C) 98.2 F (36.8 C)  SpO2: 100% 100%   Filed Weights   11/24/19 1332 11/24/19 1348 11/24/19 1921  Weight: 175 lb (79.4 kg) 175 lb (79.4 kg) 178 lb 2.1 oz (80.8 kg)    Intake/Output from previous day: 11/04 0701 - 11/05 0700 In: 2382.5 [P.O.:225; I.V.:2157.5] Out: -   GENERAL:alert, no distress and comfortable SKIN: skin color, texture, turgor are normal, no rashes or significant lesions LUNGS: clear to auscultation and percussion with normal breathing effort HEART: regular rate & rhythm and no murmurs and no lower extremity edema ABDOMEN: Positive bowel sounds, soft, nontender Musculoskeletal:no cyanosis of digits and no clubbing  NEURO: alert & oriented x 3 with fluent speech, no focal motor/sensory deficits  LABORATORY DATA:  I have reviewed  the data as listed CMP Latest Ref Rng & Units 11/30/2019 11/29/2019 11/28/2019  Glucose 70 - 99 mg/dL 108(H) 73 74  BUN 6 - 20 mg/dL <5(L) <5(L) 7   Creatinine 0.44 - 1.00 mg/dL 0.53 0.51 0.57  Sodium 135 - 145 mmol/L 135 138 137  Potassium 3.5 - 5.1 mmol/L 3.0(L) 3.2(L) 3.0(L)  Chloride 98 - 111 mmol/L 100 104 105  CO2 22 - 32 mmol/L '26 22 23  ' Calcium 8.9 - 10.3 mg/dL 8.7(L) 8.8(L) 8.4(L)  Total Protein 6.5 - 8.1 g/dL 6.2(L) 6.2(L) -  Total Bilirubin 0.3 - 1.2 mg/dL 0.9 1.0 -  Alkaline Phos 38 - 126 U/L 91 103 -  AST 15 - 41 U/L 32 24 -  ALT 0 - 44 U/L 20 22 -    Lab Results  Component Value Date   WBC 4.1 11/30/2019   HGB 9.6 (L) 11/30/2019   HCT 28.8 (L) 11/30/2019   MCV 90.0 11/30/2019   PLT 188 11/30/2019   NEUTROABS 4.1 11/27/2019    DG Chest 2 View  Result Date: 11/24/2019 CLINICAL DATA:  Nausea and vomiting. EXAM: CHEST - 2 VIEW COMPARISON:  November 04, 2019 FINDINGS: There is stable left-sided venous Port-A-Cath positioning. The heart size and mediastinal contours are within normal limits. Both lungs are clear. The visualized skeletal structures are unremarkable. IMPRESSION: No active cardiopulmonary disease. Electronically Signed   By: Virgina Norfolk M.D.   On: 11/24/2019 18:52   CT Abdomen Pelvis W Contrast  Result Date: 11/24/2019 CLINICAL DATA:  Abdominal distension, pain, nausea, vomiting. Pancreatic cancer EXAM: CT ABDOMEN AND PELVIS WITH CONTRAST TECHNIQUE: Multidetector CT imaging of the abdomen and pelvis was performed using the standard protocol following bolus administration of intravenous contrast. CONTRAST:  152m OMNIPAQUE IOHEXOL 300 MG/ML  SOLN COMPARISON:  11/04/2019 FINDINGS: Lower chest: Scarring at the left lung base.  No acute abnormality. Hepatobiliary: Numerous lesions throughout the liver, enlarging since prior study. Index right hepatic dome lesion measures 2.1 cm compared to 1.6 cm previously. Inferior right hepatic lesion measures 1.4 cm compared with 9 mm previously. Gallbladder unremarkable. Pancreas: Pancreatic head mass measuring up to 3.4 cm compared to 3.2 cm previously, likely not  significantly changed. Pancreatic ductal dilatation in the body and tail. Spleen: No focal abnormality.  Normal size. Adrenals/Urinary Tract: No adrenal abnormality. No focal renal abnormality. No stones or hydronephrosis. Urinary bladder is unremarkable. Stomach/Bowel: Stomach, large and small bowel grossly unremarkable. Normal appendix. Vascular/Lymphatic: No evidence of aneurysm or adenopathy. Reproductive: No mass or abnormality noted. Other: No free fluid or free air. Musculoskeletal: No acute bony abnormality. IMPRESSION: Pancreatic head mass again noted compatible with patient's given history of pancreatic cancer. Numerous metastases throughout the liver, enlarging since prior study. Electronically Signed   By: KRolm BaptiseM.D.   On: 11/24/2019 17:02   CT ABDOMEN PELVIS W CONTRAST  Result Date: 11/04/2019 CLINICAL DATA:  Nausea and vomiting, history of pancreatic cancer EXAM: CT ABDOMEN AND PELVIS WITH CONTRAST TECHNIQUE: Multidetector CT imaging of the abdomen and pelvis was performed using the standard protocol following bolus administration of intravenous contrast. CONTRAST:  101mOMNIPAQUE IOHEXOL 300 MG/ML  SOLN COMPARISON:  September 19, 2019. FINDINGS: Lower chest: No acute abnormality.  LEFT basilar atelectasis. Hepatobiliary: There are innumerable hypodense masses throughout the liver, increased in conspicuity in comparison to prior study. Representative mass of the hepatic dome measures 16 mm, previously 7 mm (series 2, image 8). Newly conspicuous mass of the RIGHT inferior liver measures 12 mm (  series 2, image 28). Gallbladder is unremarkable. There is mild central biliary ductal prominence. Pancreas: Ill-defined pancreatic mass of the pancreatic head measures approximately 3.2 x 1.9 by 3.3 cm, similar to mildly increased in comparison to prior. There is upstream pancreatic ductal dilation, unchanged in comparison to prior. This mass directly abuts the duodenum. There is adjacent fat stranding  along the head of the pancreas, similar in comparison to prior. Spleen: Unremarkable. Adrenals/Urinary Tract: Adrenals are unremarkable. Kidneys enhance symmetrically. No hydronephrosis. Bladder is unremarkable. Stomach/Bowel: No evidence of bowel obstruction. There is circumferential bowel wall thickening with adjacent fat stranding throughout the colon. This is most predominant in the transverse colon. Appendix is unremarkable. Vascular/Lymphatic: Peripancreatic lymph node measures 9 mm, unchanged (series 2, image 28). There is a filling defect in the LEFT internal iliac vein. Minimal atherosclerotic calcifications. Aortic branches are patent. Reproductive: Status post hysterectomy. Other: No free air. Musculoskeletal: No acute or significant osseous findings. IMPRESSION: 1. Circumferential bowel wall thickening with adjacent fat stranding throughout the colon, most predominant in the transverse colon. This is consistent with colitis, which may be infectious or inflammatory in etiology. 2. Ill-defined pancreatic head mass consistent with known malignancy, similar to mildly increased in comparison to prior CT. There are innumerable hypodense masses throughout the liver, increased in conspicuity in comparison to prior CT. Findings are worrisome for worsening metastatic disease. 3. Filling defect in the LEFT internal iliac vein, likely a thrombus with differential considerations including mixing artifact. Electronically Signed   By: [name] MD   On: 11/04/2019 19:10   DG Chest Port 1 View  Result Date: 11/04/2019 CLINICAL DATA:  Nausea, vomiting and diarrhea since chemotherapy 2 weeks prior, history of pancreatic cancer EXAM: PORTABLE CHEST 1 VIEW COMPARISON:  Radiograph 10/28/2019 FINDINGS: Accessed left subclavian approach Port-A-Cath tip terminates at the superior cavoatrial junction in similar position to prior. Telemetry leads overlie the chest. Stable streaky opacities in the left lung base  likely reflecting scarring or subsegmental atelectasis, less likely infection. No new consolidative opacity. No pneumothorax or effusion. The cardiomediastinal contours are unremarkable. No acute osseous or soft tissue abnormality. IMPRESSION: Stable streaky opacities in the left lung base likely reflecting scarring or subsegmental atelectasis, less likely infection. No new acute cardiopulmonary abnormality. Electronically Signed   By: Lovena Le M.D.   On: 11/04/2019 20:59   VAS [place] LOWER EXTREMITY VENOUS (DVT) ([facility] and [facility] 7a-7p)  Result Date: 11/05/2019  Lower Venous DVT Study Indications: CT Abdomen filling defect.  Risk Factors: Cancer. Comparison Study: No prior studies. Performing Technologist: Oliver Hum RVT  Examination Guidelines: A complete evaluation includes B-mode imaging, spectral Doppler, color Doppler, and power Doppler as needed of all accessible portions of each vessel. Bilateral testing is considered an integral part of a complete examination. Limited examinations for reoccurring indications may be performed as noted. The reflux portion of the exam is performed with the patient in reverse Trendelenburg.  +-----+---------------+---------+-----------+----------+--------------+ RIGHTCompressibilityPhasicitySpontaneityPropertiesThrombus Aging +-----+---------------+---------+-----------+----------+--------------+ CFV  Full           Yes      Yes                                 +-----+---------------+---------+-----------+----------+--------------+   +---------+---------------+---------+-----------+----------+--------------+ LEFT     CompressibilityPhasicitySpontaneityPropertiesThrombus Aging +---------+---------------+---------+-----------+----------+--------------+ CFV      Full           Yes      Yes                                 +---------+---------------+---------+-----------+----------+--------------+  SFJ      Full                                                         +---------+---------------+---------+-----------+----------+--------------+ FV Prox  Full                                                        +---------+---------------+---------+-----------+----------+--------------+ FV Mid   Full                                                        +---------+---------------+---------+-----------+----------+--------------+ FV DistalFull                                                        +---------+---------------+---------+-----------+----------+--------------+ PFV      Full                                                        +---------+---------------+---------+-----------+----------+--------------+ POP      Full           Yes      Yes                                 +---------+---------------+---------+-----------+----------+--------------+ PTV      Full                                                        +---------+---------------+---------+-----------+----------+--------------+ PERO     Full                                                        +---------+---------------+---------+-----------+----------+--------------+ EIV                     Yes      Yes                                 +---------+---------------+---------+-----------+----------+--------------+ CIV  Not visualized +---------+---------------+---------+-----------+----------+--------------+     Summary: RIGHT: - No evidence of common femoral vein obstruction.  LEFT: - There is no evidence of deep vein thrombosis in the lower extremity.  - No cystic structure found in the popliteal fossa.  *See table(s) above for measurements and observations. Electronically signed by Monica Martinez MD on 11/05/2019 at 5:21:58 PM.    Final     ASSESSMENT AND PLAN: 1.  Metastatic pancreatic adenocarcinoma 2.  Intractable nausea and vomiting secondary to chemotherapy, recovering  slowly  3.  Dehydration, improved 4.  Anemia secondary to chemotherapy 5.  Left iliac vein DVT 6.  Lupus 7.  Hypertension 8.  Migraines 9.  Anxiety  -she remains to be very symptomatic, and I think her symptoms are partially related to her metastatic pancreatic cancer which has recently progressed. Her CT from 10/30 did not show any signs of bowel obstruction, but given the location of her pancreatic cancer in the head, which can cause obstruction to duodenum, it's reasonable to get a abdominal x-ray to rule out gastric obstruction  -due to her history of QT prolongation, pt is reluctant to increase olanzapine dose  -may consider a trial of Creon to see if some of her GI symptoms are related to pancreatic insufficiency?  -I will f/u on Monday.   Truitt Merle  11/30/2019      LOS: 5 days     Truitt Merle  11/30/2019

## 2019-11-30 NOTE — Progress Notes (Signed)
PROGRESS NOTE    Cynthia Frye  EQA:[id number] DOB: Feb 20, 1960 DOA: 11/24/2019 PCP: Josetta Huddle, MD    Brief Narrative:  59 year old unfortunate female history of recently diagnosed metastatic pancreatic adenocarcinoma with mets to the liver 09/19/2019, currently undergoing chemotherapy, recent chemotherapy 11/21/2019 presenting with 1 day history of worsening nausea, vomiting, abdominal pain.  Noted to have significant hypokalemia with inability to keep anything down.  CT abdomen and pelvis negative for small bowel obstruction.  Urinalysis concerning for UTI.  Patient started on IV antibiotics, scheduled IV antiemetics, IV fluids, potassium repletion.  Assessment & Plan:   Principal Problem:   Hypokalemia Active Problems:   Essential hypertension   Lupus (systemic lupus erythematosus) (HCC)   Seasonal allergies   Chronic migraine without aura without status migrainosus, not intractable   Pancreatic cancer (HCC)   Acute deep vein thrombosis of left iliac vein (HCC)   Nausea and vomiting in adult   Dehydration   Prolonged QT interval   Emesis   Tachycardia   Anemia associated with chemotherapy   Constipation   Generalized abdominal pain   Malnutrition of moderate degree   1 Hypokalemia and hypomagnesemia Secondary to GI losses from nausea and emesis postchemotherapy. Magnesium level at 1.7 on admission.  Potassium was 2.7 on admission. Potassium remains low this AM Mg also low 1.5 Will replace Mg and K Repeat bmet in AM  2. Nausea and vomiting Likely chemotherapy induced. Patient with recent chemotherapy 11/21/2019. Patient noted to have had similar episodes recently after first cycle of chemotherapy with nausea vomiting and diarrhea and had to be hospitalized from 11/04/2019-11/09/2019 in addition to colitis.    Patient on admission initially placed on scheduled IV Ativan, IV Decadron, Zofran as needed with improvement with nausea and emesis.   Continue IV  Decadron.  Continue as needed Zofran.  Continue IV fluids.   Cont with clears for now with eventual plan to advance diet when pt feels better Still nauseated this AM. Given trial of reglan, however pt reported diarrhea afterwards thus will keep PRN only  3. Dehydration Continue IV fluids and normal saline 100 cc/h.   Continue with clear liquids as tolerated Repeat bmet in AM  4. Tachycardia Likely secondary to volume depletion, nausea vomiting, possible beta-blockade withdrawal as patient unable to keep medications down.   Resolved. Continue hydration with IV fluids as tolerated Continue Lopressor IV scheduled until pt can reliably tolerate PO, then would change to PO  5. Prolonged QT interval Repeat EKG with resolution of QTC prolongation.  Keep magnesium >2, potassium >4.  Avoid QTC prolongation medications.   Continue with supportive care.  Follow.  6. Pancreatic adenocarcinoma in the head, with multiple liver lesionslikely liver metastases Recently diagnosed per CT scan on 09/19/2019, EUS biopsy per Dr. Paulita Fujita 09/25/2019 consistent with pancreatic adenocarcinoma. Patient status post cycle 1 FOLFIRINOX 10/24/2019. Patient stated started cycle 2 of chemotherapy 11/21/2019. Patient noted per med rec to be on Decadron 4 mg daily after chemotherapy for 3 to 5 days and as such due to nausea and vomiting patient started on Decadron 4 mg IV daily x3 days.   Oncology, Dr. Burr Medico is following. Greatly appreciate assistance by Oncology Continue with analgesic and antiemetics as tolerated  7. Recent left leg vein DVT Continued on full dose Lovenox.   -No evidence of acute blood loss at this time  8. Upper abdominal cramps -Likely secondary to metastatic pancreatic adenocarcinoma.    Continue to ensure regular bowel movements given concurrent analgesics Continue  with analgesia as needed Continue with PPI as tolerated  9. Lupus Being followed in the outpatient setting by  rheumatologist, Dr. Trudie Reed on weekly methotrexate injections, Plaquenil and diclofenac.  Recommend f/u with Rheumatology as outpatient when pt is ultimately discharged  10. Probable UTI Urinalysis with moderate leukocytes, nitrite negative, rare bacteria, 21-50 WBCs.   Urine cx remains pending Pt has completed 3 days IV Rocephin and remains stable  11. Anxiety Resume scheduled IV Ativan due to nausea and emesis.  Once tolerating oral intake could likely transition to oral Ativan as needed.  Continue supportive care.   12. Hypertension Patient still with nausea and emesis.   Continue IV Lopressor while pt is still nauseated Once tolerating oral intake could transition back to home regimen oral antihypertensive medications.   13.  Constipation Patient with large bowel movement RN after soapsuds enema was given. Loose stool noted today after receiving reglan  14.  Anemia of chronic disease/chemotherapy-induced anemia Likely chemo induced.  Patient denies any overt bleeding.  Consistent with anemia of chronic disease.   -Hgb was down to 7.2 on 11/3, given 1 unit PRBC -Hgb this AM now 9.6  DVT prophylaxis: Lovenox Code Status: Full Family Communication: Pt in room  Status is: Inpatient  Remains inpatient appropriate because:IV treatments appropriate due to intensity of illness or inability to take PO   Dispo: The patient is from: Home              Anticipated d/c is to: Home              Anticipated d/c date is: 3 days              Patient currently is not medically stable to d/c.  Consultants:   Oncology  Procedures:     Antimicrobials: Anti-infectives (From admission, onward)   Start     Dose/Rate Route Frequency Ordered Stop   11/24/19 2000  cefTRIAXone (ROCEPHIN) 1 g in sodium chloride 0.9 % 100 mL IVPB  Status:  Discontinued        1 g 200 mL/hr over 30 Minutes Intravenous Every 24 hours 11/24/19 1912 11/27/19 0759      Subjective: Continues to  complain of nausea. Is afraid to eat, thinking she may vomit if she does  Objective: Vitals:   11/29/19 2117 11/30/19 0538 11/30/19 1337 11/30/19 1527  BP: 135/82 137/89 (!) 145/80 (!) 145/84  Pulse: 74 92 80 80  Resp: 17 17 16 15   Temp: 98.9 F (37.2 C) 98.6 F (37 C) 98.1 F (36.7 C) 98.2 F (36.8 C)  TempSrc: Oral Oral Oral Oral  SpO2: 99% 99% 100% 100%  Weight:      Height:        Intake/Output Summary (Last 24 hours) at 11/30/2019 1712 Last data filed at 11/30/2019 0600 Gross per 24 hour  Intake 2207.52 ml  Output --  Net 2207.52 ml   Filed Weights   11/24/19 1332 11/24/19 1348 11/24/19 1921  Weight: 79.4 kg 79.4 kg 80.8 kg    Examination: General exam: Conversant, in no acute distress Respiratory system: normal chest rise, clear, no audible wheezing Cardiovascular system: regular rhythm, s1-s2 Gastrointestinal system: Nondistended, nontender, pos BS Central nervous system: No seizures, no tremors Extremities: No cyanosis, no joint deformities Skin: No rashes, no pallor Psychiatry: Affect normal // no auditory hallucinations   Data Reviewed: I have personally reviewed following labs and imaging studies  CBC: Recent Labs  Lab 11/24/19 1427 11/25/19 0626 11/26/19 6759  11/27/19 0555 11/28/19 0601 11/29/19 0603 11/30/19 0443  WBC 5.6   < > 5.2 5.0 3.9* 5.4 4.1  NEUTROABS 4.7  --  4.3 4.1  --   --   --   HGB 8.2*   < > 7.6* 7.8* 7.2* 9.5* 9.6*  HCT 25.0*   < > 23.1* 22.8* 21.9* 27.8* 28.8*  MCV 90.9   < > 92.0 88.7 90.1 88.8 90.0  PLT 160   < > 152 176 173 174 188   < > = values in this interval not displayed.   Basic Metabolic Panel: Recent Labs  Lab 11/25/19 0626 11/25/19 0626 11/26/19 0624 11/27/19 0555 11/28/19 0601 11/29/19 0603 11/30/19 0443  NA 139   < > 136 136 137 138 135  K 4.6   < > 3.7 3.7 3.0* 3.2* 3.0*  CL 106   < > 104 103 105 104 100  CO2 25   < > 23 23 23 22 26   GLUCOSE 96   < > 98 99 74 73 108*  BUN 7   < > 7 7 7  <5* <5*   CREATININE 0.55   < > 0.57 0.62 0.57 0.51 0.53  CALCIUM 8.8*   < > 9.0 8.9 8.4* 8.8* 8.7*  MG 2.3  --  1.9 1.7 2.6*  --  1.5*  PHOS 3.1  --   --   --  2.5  --   --    < > = values in this interval not displayed.   GFR: Estimated Creatinine Clearance: 83.9 mL/min (by C-G formula based on SCr of 0.53 mg/dL). Liver Function Tests: Recent Labs  Lab 11/25/19 0626 11/26/19 0624 11/28/19 0601 11/29/19 0603 11/30/19 0443  AST 24 21  --  24 32  ALT 27 24  --  22 20  ALKPHOS 88 87  --  103 91  BILITOT 1.0 0.7  --  1.0 0.9  PROT 6.4* 6.4*  --  6.2* 6.2*  ALBUMIN 3.0* 3.0* 2.8* 3.0* 2.9*   No results for input(s): LIPASE, AMYLASE in the last 168 hours. No results for input(s): AMMONIA in the last 168 hours. Coagulation Profile: No results for input(s): INR, PROTIME in the last 168 hours. Cardiac Enzymes: No results for input(s): CKTOTAL, CKMB, CKMBINDEX, TROPONINI in the last 168 hours. BNP (last 3 results) No results for input(s): PROBNP in the last 8760 hours. HbA1C: No results for input(s): HGBA1C in the last 72 hours. CBG: Recent Labs  Lab 11/28/19 0655 11/29/19 0626 11/29/19 0802 11/29/19 1026 11/30/19 0738  GLUCAP 74 65* 69* 82 117*   Lipid Profile: No results for input(s): CHOL, HDL, LDLCALC, TRIG, CHOLHDL, LDLDIRECT in the last 72 hours. Thyroid Function Tests: No results for input(s): TSH, T4TOTAL, FREET4, T3FREE, THYROIDAB in the last 72 hours. Anemia Panel: No results for input(s): VITAMINB12, FOLATE, FERRITIN, TIBC, IRON, RETICCTPCT in the last 72 hours. Sepsis Labs: No results for input(s): PROCALCITON, LATICACIDVEN in the last 168 hours.  Recent Results (from the past 240 hour(s))  Respiratory Panel by RT PCR (Flu A&B, Covid) - Nasopharyngeal Swab     Status: None   Collection Time: 11/24/19  3:34 PM   Specimen: Nasopharyngeal Swab  Result Value Ref Range Status   SARS Coronavirus 2 by RT PCR NEGATIVE NEGATIVE Final    Comment: (NOTE) SARS-CoV-2 target  nucleic acids are NOT DETECTED.  The SARS-CoV-2 RNA is generally detectable in upper respiratoy specimens during the acute phase of infection. The lowest concentration of SARS-CoV-2  viral copies this assay can detect is 131 copies/mL. A negative result does not preclude SARS-Cov-2 infection and should not be used as the sole basis for treatment or other patient management decisions. A negative result may occur with  improper specimen collection/handling, submission of specimen other than nasopharyngeal swab, presence of viral mutation(s) within the areas targeted by this assay, and inadequate number of viral copies (<131 copies/mL). A negative result must be combined with clinical observations, patient history, and epidemiological information. The expected result is Negative.  Fact Sheet for Patients:  PinkCheek.be  Fact Sheet for Healthcare Providers:  GravelBags.it  This test is no t yet approved or cleared by the [place] FDA and  has been authorized for detection and/or diagnosis of SARS-CoV-2 by FDA under an Emergency Use Authorization (EUA). This EUA will remain  in effect (meaning this test can be used) for the duration of the COVID-19 declaration under Section 564(b)(1) of the Act, 21 U.S.C. section 360bbb-3(b)(1), unless the authorization is terminated or revoked sooner.     Influenza A by PCR NEGATIVE NEGATIVE Final   Influenza B by PCR NEGATIVE NEGATIVE Final    Comment: (NOTE) The Xpert Xpress SARS-CoV-2/FLU/RSV assay is intended as an aid in  the diagnosis of influenza from Nasopharyngeal swab specimens and  should not be used as a sole basis for treatment. Nasal washings and  aspirates are unacceptable for Xpert Xpress SARS-CoV-2/FLU/RSV  testing.  Fact Sheet for Patients: PinkCheek.be  Fact Sheet for Healthcare  Providers: GravelBags.it  This test is not yet approved or cleared by the [place] FDA and  has been authorized for detection and/or diagnosis of SARS-CoV-2 by  FDA under an Emergency Use Authorization (EUA). This EUA will remain  in effect (meaning this test can be used) for the duration of the  Covid-19 declaration under Section 564(b)(1) of the Act, 21  U.S.C. section 360bbb-3(b)(1), unless the authorization is  terminated or revoked. Performed at [facility], [place] [address] [place], [place] [address]      Radiology Studies: No results found.  Scheduled Meds: . sodium chloride   Intravenous Once  . Chlorhexidine Gluconate Cloth  6 each Topical Daily  . enoxaparin  120 mg Subcutaneous Q24H  . feeding supplement  237 mL Oral BID BM  . fluticasone  2 spray Each Nare Daily  . metoprolol tartrate  5 mg Intravenous Q8H  . OLANZapine  2.5 mg Oral QHS  . pantoprazole (PROTONIX) IV  40 mg Intravenous Q24H  . polyethylene glycol  17 g Oral Daily  . senna-docusate  1 tablet Oral BID  . sodium chloride flush  3 mL Intravenous Q12H  . topiramate  50 mg Oral QHS   Continuous Infusions: . dextrose 5 % and 0.9% NaCl 100 mL/hr at 11/30/19 0542     LOS: 5 days   Marylu Lund, MD [facility] Pager On Amion  If 7PM-7AM, please contact night-coverage 11/30/2019, 5:12 PM

## 2019-12-01 DIAGNOSIS — D6481 Anemia due to antineoplastic chemotherapy: Secondary | ICD-10-CM | POA: Diagnosis not present

## 2019-12-01 DIAGNOSIS — T451X5A Adverse effect of antineoplastic and immunosuppressive drugs, initial encounter: Secondary | ICD-10-CM | POA: Diagnosis not present

## 2019-12-01 DIAGNOSIS — G43709 Chronic migraine without aura, not intractable, without status migrainosus: Secondary | ICD-10-CM | POA: Diagnosis not present

## 2019-12-01 DIAGNOSIS — E876 Hypokalemia: Secondary | ICD-10-CM | POA: Diagnosis not present

## 2019-12-01 LAB — COMPREHENSIVE METABOLIC PANEL
ALT: 28 U/L (ref 0–44)
AST: 53 U/L — ABNORMAL HIGH (ref 15–41)
Albumin: 3 g/dL — ABNORMAL LOW (ref 3.5–5.0)
Alkaline Phosphatase: 95 U/L (ref 38–126)
Anion gap: 7 (ref 5–15)
BUN: <5 mg/dL — ABNORMAL LOW (ref 6–20)
CO2: 26 mmol/L (ref 22–32)
Calcium: 8.7 mg/dL — ABNORMAL LOW (ref 8.9–10.3)
Chloride: 104 mmol/L (ref 98–111)
Creatinine, Ser: 0.51 mg/dL (ref 0.44–1.00)
GFR, Estimated: >60 mL/min (ref >60)
Glucose, Bld: 116 mg/dL — ABNORMAL HIGH (ref 70–99)
Potassium: 3.1 mmol/L — ABNORMAL LOW (ref 3.5–5.1)
Sodium: 137 mmol/L (ref 135–145)
Total Bilirubin: 0.7 mg/dL (ref 0.3–1.2)
Total Protein: 6.2 g/dL — ABNORMAL LOW (ref 6.5–8.1)

## 2019-12-01 LAB — CBC
HCT: 28.3 % — ABNORMAL LOW (ref 36.0–46.0)
Hemoglobin: 9.3 g/dL — ABNORMAL LOW (ref 12.0–15.0)
MCH: 29.7 pg (ref 26.0–34.0)
MCHC: 32.9 g/dL (ref 30.0–36.0)
MCV: 90.4 fL (ref 80.0–100.0)
Platelets: 179 10*3/uL (ref 150–400)
RBC: 3.13 MIL/uL — ABNORMAL LOW (ref 3.87–5.11)
RDW: 15.8 % — ABNORMAL HIGH (ref 11.5–15.5)
WBC: 3.5 10*3/uL — ABNORMAL LOW (ref 4.0–10.5)
nRBC: 0 % (ref 0.0–0.2)

## 2019-12-01 LAB — GLUCOSE, CAPILLARY: Glucose-Capillary: 111 mg/dL — ABNORMAL HIGH (ref 70–99)

## 2019-12-01 LAB — MAGNESIUM: Magnesium: 1.9 mg/dL (ref 1.7–2.4)

## 2019-12-01 MED ORDER — POTASSIUM CHLORIDE 10 MEQ/100ML IV SOLN
10.0000 meq | INTRAVENOUS | Status: AC
  Administered 2019-12-01 (×6): 10 meq via INTRAVENOUS
  Filled 2019-12-01 (×6): qty 100

## 2019-12-01 MED ORDER — LORAZEPAM 2 MG/ML IJ SOLN: 0.5000 mg | Freq: Three times a day (TID) | INTRAMUSCULAR | Status: DC | PRN

## 2019-12-01 NOTE — Progress Notes (Signed)
PROGRESS NOTE    Cynthia Frye  NOB:[id number] DOB: [birth year] DOA: 11/24/2019 PCP: Josetta Huddle, MD    Brief Narrative:  59 year old unfortunate female history of recently diagnosed metastatic pancreatic adenocarcinoma with mets to the liver 09/19/2019, currently undergoing chemotherapy, recent chemotherapy 11/21/2019 presenting with 1 day history of worsening nausea, vomiting, abdominal pain.  Noted to have significant hypokalemia with inability to keep anything down.  CT abdomen and pelvis negative for small bowel obstruction.  Urinalysis concerning for UTI.  Patient started on IV antibiotics, scheduled IV antiemetics, IV fluids, potassium repletion.  Assessment & Plan:   Principal Problem:   Hypokalemia Active Problems:   Essential hypertension   Lupus (systemic lupus erythematosus) (HCC)   Seasonal allergies   Chronic migraine without aura without status migrainosus, not intractable   Pancreatic cancer (HCC)   Acute deep vein thrombosis of left iliac vein (HCC)   Nausea and vomiting in adult   Dehydration   Prolonged QT interval   Emesis   Tachycardia   Anemia associated with chemotherapy   Constipation   Generalized abdominal pain   Malnutrition of moderate degree   1 Hypokalemia and hypomagnesemia Secondary to GI losses from nausea and emesis postchemotherapy. Magnesium level at 1.7 on admission.  Potassium was 2.7 on admission. Potassium remains low this AM at 3.1 Mg currently 1.9 Will replace K Repeat bmet in AM  2. Nausea and vomiting Likely chemotherapy induced. Patient with recent chemotherapy 11/21/2019. Patient noted to have had similar episodes recently after first cycle of chemotherapy with nausea vomiting and diarrhea and had to be hospitalized from 11/04/2019-11/09/2019 in addition to colitis.    Patient on admission initially placed on scheduled IV Ativan, IV Decadron, Zofran as needed with improvement with nausea and emesis.   Continue IV  Decadron.  Continue as needed Zofran.  Continue IV fluids.   Cont with clears for now with eventual plan to advance diet when pt feels better Recommend trial of ginger for nausea  3. Dehydration Continue IV fluids and normal saline 100 cc/h.   Continue with clear liquids as tolerated Repeat bmet in AM  4. Tachycardia Likely secondary to volume depletion, nausea vomiting, possible beta-blockade withdrawal as patient unable to keep medications down.   Resolved. Continue hydration with IV fluids as tolerated Continue Lopressor IV scheduled until pt can reliably tolerate PO, then would change to PO  5. Prolonged QT interval Repeat EKG with resolution of QTC prolongation.  Keep magnesium >2, potassium >4.  Avoid QTC prolongation medications.   Continue with supportive care.  Cont to follow  6. Pancreatic adenocarcinoma in the head, with multiple liver lesionslikely liver metastases Recently diagnosed per CT scan on 09/19/2019, EUS biopsy per Dr. Paulita Fujita 09/25/2019 consistent with pancreatic adenocarcinoma. Patient status post cycle 1 FOLFIRINOX 10/24/2019. Patient stated started cycle 2 of chemotherapy 11/21/2019. Patient noted per med rec to be on Decadron 4 mg daily after chemotherapy for 3 to 5 days and as such due to nausea and vomiting patient started on Decadron 4 mg IV daily x3 days.   Oncology, Dr. Burr Medico is following. Greatly appreciate assistance by Oncology Continue with analgesic and antiemetics as tolerated  7. Recent left leg vein DVT Continued on full dose Lovenox.   -No evidence of acute blood loss at this time  8. Upper abdominal cramps -Likely secondary to metastatic pancreatic adenocarcinoma.    Continue to ensure regular bowel movements given concurrent analgesics Continue with analgesia as needed Continue with PPI as tolerated  9.  Lupus Being followed in the outpatient setting by rheumatologist, Dr. Trudie Reed on weekly methotrexate injections, Plaquenil and  diclofenac.  Recommend f/u with Rheumatology as outpatient when pt is ultimately discharged  10. Probable UTI Urinalysis with moderate leukocytes, nitrite negative, rare bacteria, 21-50 WBCs.   Urine cx remains pending Pt has completed 3 days IV Rocephin and remains stable  11. Anxiety Resume scheduled IV Ativan due to nausea and emesis.  Once tolerating oral intake could likely transition to oral Ativan as needed.  Continue supportive care.   12. Hypertension Patient still with nausea and emesis.   Pt is continued on IV Lopressor while pt is still nauseated Once tolerating oral intake could transition back to home regimen oral antihypertensive medications.   13.  Constipation Patient with large bowel movement RN after soapsuds enema was given. Loose stool noted today after receiving reglan  14.  Anemia of chronic disease/chemotherapy-induced anemia Likely chemo induced.  Patient denies any overt bleeding.  Consistent with anemia of chronic disease.   -Hgb was down to 7.2 on 11/3, given 1 unit PRBC -Hgb this AM now 9.3  DVT prophylaxis: Lovenox Code Status: Full Family Communication: Pt in room  Status is: Inpatient  Remains inpatient appropriate because:IV treatments appropriate due to intensity of illness or inability to take PO   Dispo: The patient is from: Home              Anticipated d/c is to: Home              Anticipated d/c date is: 3 days              Patient currently is not medically stable to d/c.  Consultants:   Oncology  Procedures:     Antimicrobials: Anti-infectives (From admission, onward)   Start     Dose/Rate Route Frequency Ordered Stop   11/24/19 2000  cefTRIAXone (ROCEPHIN) 1 g in sodium chloride 0.9 % 100 mL IVPB  Status:  Discontinued        1 g 200 mL/hr over 30 Minutes Intravenous Every 24 hours 11/24/19 1912 11/27/19 0759      Subjective: Still feeling nauseated. Did vomit later in the AM  Objective: Vitals:    11/30/19 1527 11/30/19 2043 12/01/19 0628 12/01/19 1416  BP: (!) 145/84 (!) 149/91 126/86 (!) 152/89  Pulse: 80 84 98 75  Resp: 15 20 20    Temp: 98.2 F (36.8 C) 99 F (37.2 C) 98 F (36.7 C) 98 F (36.7 C)  TempSrc: Oral Oral Oral Oral  SpO2: 100% 100% 100% 100%  Weight:      Height:        Intake/Output Summary (Last 24 hours) at 12/01/2019 1523 Last data filed at 12/01/2019 1227 Gross per 24 hour  Intake 2951.77 ml  Output 50 ml  Net 2901.77 ml   Filed Weights   11/24/19 1332 11/24/19 1348 11/24/19 1921  Weight: 79.4 kg 79.4 kg 80.8 kg    Examination: General exam: Awake, laying in bed, in nad Respiratory system: Normal respiratory effort, no wheezing Cardiovascular system: regular rate, s1, s2 Gastrointestinal system: Soft, nondistended, positive BS Central nervous system: CN2-12 grossly intact, strength intact Extremities: Perfused, no clubbing Skin: Normal skin turgor, no notable skin lesions seen Psychiatry: Mood normal // no visual hallucinations   Data Reviewed: I have personally reviewed following labs and imaging studies  CBC: Recent Labs  Lab 11/26/19 0624 11/26/19 0624 11/27/19 0555 11/28/19 0601 11/29/19 0603 11/30/19 0443 12/01/19 0639  WBC  5.2   < > 5.0 3.9* 5.4 4.1 3.5*  NEUTROABS 4.3  --  4.1  --   --   --   --   HGB 7.6*   < > 7.8* 7.2* 9.5* 9.6* 9.3*  HCT 23.1*   < > 22.8* 21.9* 27.8* 28.8* 28.3*  MCV 92.0   < > 88.7 90.1 88.8 90.0 90.4  PLT 152   < > 176 173 174 188 179   < > = values in this interval not displayed.   Basic Metabolic Panel: Recent Labs  Lab 11/25/19 0626 11/25/19 0626 11/26/19 0624 11/26/19 0624 11/27/19 0555 11/28/19 0601 11/29/19 0603 11/30/19 0443 12/01/19 0639  NA 139   < > 136   < > 136 137 138 135 137  K 4.6   < > 3.7   < > 3.7 3.0* 3.2* 3.0* 3.1*  CL 106   < > 104   < > 103 105 104 100 104  CO2 25   < > 23   < > 23 23 22 26 26   GLUCOSE 96   < > 98   < > 99 74 73 108* 116*  BUN 7   < > 7   < > 7 7 <5*  <5* <5*  CREATININE 0.55   < > 0.57   < > 0.62 0.57 0.51 0.53 0.51  CALCIUM 8.8*   < > 9.0   < > 8.9 8.4* 8.8* 8.7* 8.7*  MG 2.3   < > 1.9  --  1.7 2.6*  --  1.5* 1.9  PHOS 3.1  --   --   --   --  2.5  --   --   --    < > = values in this interval not displayed.   GFR: Estimated Creatinine Clearance: 83.9 mL/min (by C-G formula based on SCr of 0.51 mg/dL). Liver Function Tests: Recent Labs  Lab 11/25/19 0626 11/25/19 0626 11/26/19 0624 11/28/19 0601 11/29/19 0603 11/30/19 0443 12/01/19 0639  AST 24  --  21  --  24 32 53*  ALT 27  --  24  --  22 20 28   ALKPHOS 88  --  87  --  103 91 95  BILITOT 1.0  --  0.7  --  1.0 0.9 0.7  PROT 6.4*  --  6.4*  --  6.2* 6.2* 6.2*  ALBUMIN 3.0*   < > 3.0* 2.8* 3.0* 2.9* 3.0*   < > = values in this interval not displayed.   No results for input(s): LIPASE, AMYLASE in the last 168 hours. No results for input(s): AMMONIA in the last 168 hours. Coagulation Profile: No results for input(s): INR, PROTIME in the last 168 hours. Cardiac Enzymes: No results for input(s): CKTOTAL, CKMB, CKMBINDEX, TROPONINI in the last 168 hours. BNP (last 3 results) No results for input(s): PROBNP in the last 8760 hours. HbA1C: No results for input(s): HGBA1C in the last 72 hours. CBG: Recent Labs  Lab 11/29/19 0626 11/29/19 0802 11/29/19 1026 11/30/19 0738 12/01/19 0630  GLUCAP 65* 69* 82 117* 111*   Lipid Profile: No results for input(s): CHOL, HDL, LDLCALC, TRIG, CHOLHDL, LDLDIRECT in the last 72 hours. Thyroid Function Tests: No results for input(s): TSH, T4TOTAL, FREET4, T3FREE, THYROIDAB in the last 72 hours. Anemia Panel: No results for input(s): VITAMINB12, FOLATE, FERRITIN, TIBC, IRON, RETICCTPCT in the last 72 hours. Sepsis Labs: No results for input(s): PROCALCITON, LATICACIDVEN in the last 168 hours.  Recent Results (from  the past 240 hour(s))  Respiratory Panel by RT PCR (Flu A&B, Covid) - Nasopharyngeal Swab     Status: None   Collection  Time: 11/24/19  3:34 PM   Specimen: Nasopharyngeal Swab  Result Value Ref Range Status   SARS Coronavirus 2 by RT PCR NEGATIVE NEGATIVE Final    Comment: (NOTE) SARS-CoV-2 target nucleic acids are NOT DETECTED.  The SARS-CoV-2 RNA is generally detectable in upper respiratoy specimens during the acute phase of infection. The lowest concentration of SARS-CoV-2 viral copies this assay can detect is 131 copies/mL. A negative result does not preclude SARS-Cov-2 infection and should not be used as the sole basis for treatment or other patient management decisions. A negative result may occur with  improper specimen collection/handling, submission of specimen other than nasopharyngeal swab, presence of viral mutation(s) within the areas targeted by this assay, and inadequate number of viral copies (<131 copies/mL). A negative result must be combined with clinical observations, patient history, and epidemiological information. The expected result is Negative.  Fact Sheet for Patients:  PinkCheek.be  Fact Sheet for Healthcare Providers:  GravelBags.it  This test is no t yet approved or cleared by the [place] FDA and  has been authorized for detection and/or diagnosis of SARS-CoV-2 by FDA under an Emergency Use Authorization (EUA). This EUA will remain  in effect (meaning this test can be used) for the duration of the COVID-19 declaration under Section 564(b)(1) of the Act, 21 U.S.C. section 360bbb-3(b)(1), unless the authorization is terminated or revoked sooner.     Influenza A by PCR NEGATIVE NEGATIVE Final   Influenza B by PCR NEGATIVE NEGATIVE Final    Comment: (NOTE) The Xpert Xpress SARS-CoV-2/FLU/RSV assay is intended as an aid in  the diagnosis of influenza from Nasopharyngeal swab specimens and  should not be used as a sole basis for treatment. Nasal washings and  aspirates are unacceptable for Xpert Xpress  SARS-CoV-2/FLU/RSV  testing.  Fact Sheet for Patients: PinkCheek.be  Fact Sheet for Healthcare Providers: GravelBags.it  This test is not yet approved or cleared by the [place] FDA and  has been authorized for detection and/or diagnosis of SARS-CoV-2 by  FDA under an Emergency Use Authorization (EUA). This EUA will remain  in effect (meaning this test can be used) for the duration of the  Covid-19 declaration under Section 564(b)(1) of the Act, 21  U.S.C. section 360bbb-3(b)(1), unless the authorization is  terminated or revoked. Performed at [facility], [place] [address], [place], [place] [address]      Radiology Studies: No results found.  Scheduled Meds: . sodium chloride   Intravenous Once  . Chlorhexidine Gluconate Cloth  6 each Topical Daily  . enoxaparin  120 mg Subcutaneous Q24H  . feeding supplement  237 mL Oral BID BM  . fluticasone  2 spray Each Nare Daily  . metoprolol tartrate  5 mg Intravenous Q8H  . OLANZapine  2.5 mg Oral QHS  . pantoprazole (PROTONIX) IV  40 mg Intravenous Q24H  . polyethylene glycol  17 g Oral Daily  . senna-docusate  1 tablet Oral BID  . sodium chloride flush  3 mL Intravenous Q12H  . topiramate  50 mg Oral QHS   Continuous Infusions: . dextrose 5 % and 0.9% NaCl 100 mL/hr at 12/01/19 1046  . potassium chloride 10 mEq (12/01/19 1436)     LOS: 6 days   Marylu Lund, MD [facility] Pager On Amion  If 7PM-7AM, please contact night-coverage 12/01/2019,  3:23 PM

## 2019-12-02 DIAGNOSIS — K5900 Constipation, unspecified: Secondary | ICD-10-CM | POA: Diagnosis not present

## 2019-12-02 DIAGNOSIS — R112 Nausea with vomiting, unspecified: Secondary | ICD-10-CM | POA: Diagnosis not present

## 2019-12-02 DIAGNOSIS — G43709 Chronic migraine without aura, not intractable, without status migrainosus: Secondary | ICD-10-CM | POA: Diagnosis not present

## 2019-12-02 DIAGNOSIS — Z515 Encounter for palliative care: Secondary | ICD-10-CM

## 2019-12-02 DIAGNOSIS — C250 Malignant neoplasm of head of pancreas: Secondary | ICD-10-CM | POA: Diagnosis not present

## 2019-12-02 DIAGNOSIS — E876 Hypokalemia: Secondary | ICD-10-CM | POA: Diagnosis not present

## 2019-12-02 DIAGNOSIS — D6481 Anemia due to antineoplastic chemotherapy: Secondary | ICD-10-CM | POA: Diagnosis not present

## 2019-12-02 DIAGNOSIS — R9431 Abnormal electrocardiogram [ECG] [EKG]: Secondary | ICD-10-CM | POA: Diagnosis not present

## 2019-12-02 LAB — COMPREHENSIVE METABOLIC PANEL
ALT: 41 U/L (ref 0–44)
AST: 69 U/L — ABNORMAL HIGH (ref 15–41)
Albumin: 2.9 g/dL — ABNORMAL LOW (ref 3.5–5.0)
Alkaline Phosphatase: 96 U/L (ref 38–126)
Anion gap: 6 (ref 5–15)
BUN: <5 mg/dL — ABNORMAL LOW (ref 6–20)
CO2: 26 mmol/L (ref 22–32)
Calcium: 8.9 mg/dL (ref 8.9–10.3)
Chloride: 106 mmol/L (ref 98–111)
Creatinine, Ser: 0.52 mg/dL (ref 0.44–1.00)
GFR, Estimated: >60 mL/min (ref >60)
Glucose, Bld: 116 mg/dL — ABNORMAL HIGH (ref 70–99)
Potassium: 3.1 mmol/L — ABNORMAL LOW (ref 3.5–5.1)
Sodium: 138 mmol/L (ref 135–145)
Total Bilirubin: 0.8 mg/dL (ref 0.3–1.2)
Total Protein: 6.1 g/dL — ABNORMAL LOW (ref 6.5–8.1)

## 2019-12-02 LAB — GLUCOSE, CAPILLARY: Glucose-Capillary: 107 mg/dL — ABNORMAL HIGH (ref 70–99)

## 2019-12-02 LAB — CBC
HCT: 27.8 % — ABNORMAL LOW (ref 36.0–46.0)
Hemoglobin: 9.2 g/dL — ABNORMAL LOW (ref 12.0–15.0)
MCH: 30.2 pg (ref 26.0–34.0)
MCHC: 33.1 g/dL (ref 30.0–36.0)
MCV: 91.1 fL (ref 80.0–100.0)
Platelets: 167 10*3/uL (ref 150–400)
RBC: 3.05 MIL/uL — ABNORMAL LOW (ref 3.87–5.11)
RDW: 15.5 % (ref 11.5–15.5)
WBC: 3.6 10*3/uL — ABNORMAL LOW (ref 4.0–10.5)
nRBC: 0 % (ref 0.0–0.2)

## 2019-12-02 LAB — MAGNESIUM: Magnesium: 1.8 mg/dL (ref 1.7–2.4)

## 2019-12-02 MED ORDER — OLANZAPINE 5 MG PO TABS
5.0000 mg | ORAL_TABLET | Freq: Every day | ORAL | Status: DC
  Administered 2019-12-02 – 2019-12-04 (×3): 5 mg via ORAL
  Filled 2019-12-02 (×3): qty 1

## 2019-12-02 MED ORDER — LOPERAMIDE HCL 2 MG PO CAPS
2.0000 mg | ORAL_CAPSULE | ORAL | Status: DC | PRN
  Administered 2019-12-02: 2 mg via ORAL
  Filled 2019-12-02: qty 1

## 2019-12-02 MED ORDER — HYDROXYZINE HCL 10 MG PO TABS
10.0000 mg | ORAL_TABLET | Freq: Three times a day (TID) | ORAL | Status: DC | PRN
  Filled 2019-12-02: qty 1

## 2019-12-02 MED ORDER — MAGNESIUM SULFATE 2 GM/50ML IV SOLN
2.0000 g | Freq: Once | INTRAVENOUS | Status: AC
  Administered 2019-12-02: 2 g via INTRAVENOUS
  Filled 2019-12-02: qty 50

## 2019-12-02 MED ORDER — POTASSIUM CHLORIDE 10 MEQ/100ML IV SOLN
10.0000 meq | INTRAVENOUS | Status: AC
  Administered 2019-12-02 (×6): 10 meq via INTRAVENOUS
  Filled 2019-12-02 (×3): qty 100
  Filled 2019-12-02: qty 200
  Filled 2019-12-02: qty 100

## 2019-12-02 NOTE — Progress Notes (Signed)
PROGRESS NOTE    Cynthia Frye  YSA:[id number] DOB: Mar 21, 1960 DOA: 11/24/2019 PCP: Josetta Huddle, MD    Brief Narrative:  59 year old unfortunate female history of recently diagnosed metastatic pancreatic adenocarcinoma with mets to the liver 09/19/2019, currently undergoing chemotherapy, recent chemotherapy 11/21/2019 presenting with 1 day history of worsening nausea, vomiting, abdominal pain.  Noted to have significant hypokalemia with inability to keep anything down.  CT abdomen and pelvis negative for small bowel obstruction.  Urinalysis concerning for UTI.  Patient started on IV antibiotics, scheduled IV antiemetics, IV fluids, potassium repletion.  Assessment & Plan:   Principal Problem:   Hypokalemia Active Problems:   Essential hypertension   Lupus (systemic lupus erythematosus) (HCC)   Seasonal allergies   Chronic migraine without aura without status migrainosus, not intractable   Pancreatic cancer (HCC)   Acute deep vein thrombosis of left iliac vein (HCC)   Nausea and vomiting in adult   Dehydration   Prolonged QT interval   Emesis   Tachycardia   Anemia associated with chemotherapy   Constipation   Generalized abdominal pain   Malnutrition of moderate degree   1 Hypokalemia and hypomagnesemia Secondary to GI losses from nausea and emesis postchemotherapy. Magnesium level at 1.7 on admission.  Potassium was 2.7 on admission. Potassium still low at 3.1 Mg currently 1.9 Will replace K Will repeat bmet in AM  2. Nausea and vomiting Likely chemotherapy induced. Patient with recent chemotherapy 11/21/2019. Patient noted to have had similar episodes recently after first cycle of chemotherapy with nausea vomiting and diarrhea and had to be hospitalized from 11/04/2019-11/09/2019 in addition to colitis.    Patient on admission initially placed on scheduled IV Ativan, IV Decadron, Zofran as needed with improvement with nausea and emesis.   Continue IV Decadron.   Continue as needed Zofran.  Continue IV fluids.   Cont with clears for now with eventual plan to advance diet when pt feels better Still with on-going nausea/vomiting despite multiple anti-emetics Will consult Palliative Care for assistance with symptom management  3. Dehydration Continue IV fluids and normal saline 100 cc/h.   Continue with clear liquids as tolerated Recheck bmet in AM  4. Tachycardia Likely secondary to volume depletion, nausea vomiting, possible beta-blockade withdrawal as patient unable to keep medications down.   Resolved. Continue hydration with IV fluids as tolerated Continue Lopressor IV scheduled until pt can reliably tolerate PO, then would change to PO  5. Prolonged QT interval Repeat EKG with resolution of QTC prolongation.  Keep magnesium >2, potassium >4.  Avoid QTC prolongation medications.   Continue with supportive care.  Cont to follow  6. Pancreatic adenocarcinoma in the head, with multiple liver lesionslikely liver metastases Recently diagnosed per CT scan on 09/19/2019, EUS biopsy per Dr. Paulita Fujita 09/25/2019 consistent with pancreatic adenocarcinoma. Patient status post cycle 1 FOLFIRINOX 10/24/2019. Patient stated started cycle 2 of chemotherapy 11/21/2019. Patient noted per med rec to be on Decadron 4 mg daily after chemotherapy for 3 to 5 days and as such due to nausea and vomiting patient started on Decadron 4 mg IV daily x3 days.   Oncology, Dr. Burr Medico is following. Greatly appreciate assistance by Oncology Continue with analgesic and antiemetics as pt tolerates  7. Recent left leg vein DVT Continued on full dose Lovenox.   -No evidence of acute blood loss at this time  8. Upper abdominal cramps -Likely secondary to metastatic pancreatic adenocarcinoma.    Continue to ensure regular bowel movements given concurrent analgesics Continue with  analgesia as needed Continue with PPI as tolerated  9. Lupus Being followed in the  outpatient setting by rheumatologist, Dr. Trudie Reed on weekly methotrexate injections, Plaquenil and diclofenac.  Recommend f/u with Rheumatology as outpatient when pt is ultimately discharged  10. Probable UTI Urinalysis with moderate leukocytes, nitrite negative, rare bacteria, 21-50 WBCs.   Urine cx remains pending Pt has completed 3 days IV Rocephin and remains stable  11. Anxiety Resume scheduled IV Ativan due to nausea and emesis.  Once tolerating oral intake could likely transition to oral Ativan as needed.  Continue supportive care.   12. Hypertension Patient still with nausea and emesis.   Pt is continued on IV Lopressor while pt is still nauseated Once tolerating oral intake could transition back to home regimen oral antihypertensive medications.   13.  Constipation Patient with large bowel movement RN after soapsuds enema was given. Diarrhea noted today Will order imodium for diarrhea  14.  Anemia of chronic disease/chemotherapy-induced anemia Likely chemo induced.  Patient denies any overt bleeding.  Consistent with anemia of chronic disease.   -Hgb was down to 7.2 on 11/3, given 1 unit PRBC -Hgb this AM now 9.2  DVT prophylaxis: Lovenox Code Status: Full Family Communication: Pt in room  Status is: Inpatient  Remains inpatient appropriate because:IV treatments appropriate due to intensity of illness or inability to take PO   Dispo: The patient is from: Home              Anticipated d/c is to: Home              Anticipated d/c date is: 3 days              Patient currently is not medically stable to d/c.  Consultants:   Oncology  Palliative Care  Procedures:     Antimicrobials: Anti-infectives (From admission, onward)   Start     Dose/Rate Route Frequency Ordered Stop   11/24/19 2000  cefTRIAXone (ROCEPHIN) 1 g in sodium chloride 0.9 % 100 mL IVPB  Status:  Discontinued        1 g 200 mL/hr over 30 Minutes Intravenous Every 24 hours 11/24/19  1912 11/27/19 0759      Subjective: Continues to feel nauseated, actively throwing up this AM  Objective: Vitals:   12/01/19 2237 12/02/19 0224 12/02/19 0537 12/02/19 1422  BP: (!) 155/87  (!) 147/95 (!) 167/94  Pulse: 83  87 78  Resp: 16  16 18   Temp: 98.1 F (36.7 C)  98.3 F (36.8 C) 97.8 F (36.6 C)  TempSrc: Oral  Oral Oral  SpO2: 99%  99% 100%  Weight:  80.3 kg    Height:       No intake or output data in the 24 hours ending 12/02/19 1612 Filed Weights   11/24/19 1348 11/24/19 1921 12/02/19 0224  Weight: 79.4 kg 80.8 kg 80.3 kg    Examination: General exam: Conversant, appears uncomfortable Respiratory system: normal chest rise, clear, no audible wheezing Cardiovascular system: regular rhythm, s1-s2 Gastrointestinal system: Nondistended, nontender, pos BS Central nervous system: No seizures, no tremors Extremities: No cyanosis, no joint deformities Skin: No rashes, no pallor Psychiatry: Affect normal // no auditory hallucinations   Data Reviewed: I have personally reviewed following labs and imaging studies  CBC: Recent Labs  Lab 11/26/19 0624 11/26/19 0624 11/27/19 0555 11/27/19 0555 11/28/19 0601 11/29/19 0603 11/30/19 0443 12/01/19 0639 12/02/19 0622  WBC 5.2   < > 5.0   < >  3.9* 5.4 4.1 3.5* 3.6*  NEUTROABS 4.3  --  4.1  --   --   --   --   --   --   HGB 7.6*   < > 7.8*   < > 7.2* 9.5* 9.6* 9.3* 9.2*  HCT 23.1*   < > 22.8*   < > 21.9* 27.8* 28.8* 28.3* 27.8*  MCV 92.0   < > 88.7   < > 90.1 88.8 90.0 90.4 91.1  PLT 152   < > 176   < > 173 174 188 179 167   < > = values in this interval not displayed.   Basic Metabolic Panel: Recent Labs  Lab 11/27/19 0555 11/27/19 0555 11/28/19 0601 11/29/19 0603 11/30/19 0443 12/01/19 0639 12/02/19 0622  NA 136   < > 137 138 135 137 138  K 3.7   < > 3.0* 3.2* 3.0* 3.1* 3.1*  CL 103   < > 105 104 100 104 106  CO2 23   < > 23 22 26 26 26   GLUCOSE 99   < > 74 73 108* 116* 116*  BUN 7   < > 7 <5* <5*  <5* <5*  CREATININE 0.62   < > 0.57 0.51 0.53 0.51 0.52  CALCIUM 8.9   < > 8.4* 8.8* 8.7* 8.7* 8.9  MG 1.7  --  2.6*  --  1.5* 1.9 1.8  PHOS  --   --  2.5  --   --   --   --    < > = values in this interval not displayed.   GFR: Estimated Creatinine Clearance: 83.6 mL/min (by C-G formula based on SCr of 0.52 mg/dL). Liver Function Tests: Recent Labs  Lab 11/26/19 0624 11/26/19 0624 11/28/19 0601 11/29/19 0603 11/30/19 0443 12/01/19 0639 12/02/19 0622  AST 21  --   --  24 32 53* 69*  ALT 24  --   --  22 20 28  41  ALKPHOS 87  --   --  103 91 95 96  BILITOT 0.7  --   --  1.0 0.9 0.7 0.8  PROT 6.4*  --   --  6.2* 6.2* 6.2* 6.1*  ALBUMIN 3.0*   < > 2.8* 3.0* 2.9* 3.0* 2.9*   < > = values in this interval not displayed.   No results for input(s): LIPASE, AMYLASE in the last 168 hours. No results for input(s): AMMONIA in the last 168 hours. Coagulation Profile: No results for input(s): INR, PROTIME in the last 168 hours. Cardiac Enzymes: No results for input(s): CKTOTAL, CKMB, CKMBINDEX, TROPONINI in the last 168 hours. BNP (last 3 results) No results for input(s): PROBNP in the last 8760 hours. HbA1C: No results for input(s): HGBA1C in the last 72 hours. CBG: Recent Labs  Lab 11/29/19 0802 11/29/19 1026 11/30/19 0738 12/01/19 0630 12/02/19 0647  GLUCAP 69* 82 117* 111* 107*   Lipid Profile: No results for input(s): CHOL, HDL, LDLCALC, TRIG, CHOLHDL, LDLDIRECT in the last 72 hours. Thyroid Function Tests: No results for input(s): TSH, T4TOTAL, FREET4, T3FREE, THYROIDAB in the last 72 hours. Anemia Panel: No results for input(s): VITAMINB12, FOLATE, FERRITIN, TIBC, IRON, RETICCTPCT in the last 72 hours. Sepsis Labs: No results for input(s): PROCALCITON, LATICACIDVEN in the last 168 hours.  Recent Results (from the past 240 hour(s))  Respiratory Panel by RT PCR (Flu A&B, Covid) - Nasopharyngeal Swab     Status: None   Collection Time: 11/24/19  3:34 PM  Specimen:  Nasopharyngeal Swab  Result Value Ref Range Status   SARS Coronavirus 2 by RT PCR NEGATIVE NEGATIVE Final    Comment: (NOTE) SARS-CoV-2 target nucleic acids are NOT DETECTED.  The SARS-CoV-2 RNA is generally detectable in upper respiratoy specimens during the acute phase of infection. The lowest concentration of SARS-CoV-2 viral copies this assay can detect is 131 copies/mL. A negative result does not preclude SARS-Cov-2 infection and should not be used as the sole basis for treatment or other patient management decisions. A negative result may occur with  improper specimen collection/handling, submission of specimen other than nasopharyngeal swab, presence of viral mutation(s) within the areas targeted by this assay, and inadequate number of viral copies (<131 copies/mL). A negative result must be combined with clinical observations, patient history, and epidemiological information. The expected result is Negative.  Fact Sheet for Patients:  PinkCheek.be  Fact Sheet for Healthcare Providers:  GravelBags.it  This test is no t yet approved or cleared by the [place] FDA and  has been authorized for detection and/or diagnosis of SARS-CoV-2 by FDA under an Emergency Use Authorization (EUA). This EUA will remain  in effect (meaning this test can be used) for the duration of the COVID-19 declaration under Section 564(b)(1) of the Act, 21 U.S.C. section 360bbb-3(b)(1), unless the authorization is terminated or revoked sooner.     Influenza A by PCR NEGATIVE NEGATIVE Final   Influenza B by PCR NEGATIVE NEGATIVE Final    Comment: (NOTE) The Xpert Xpress SARS-CoV-2/FLU/RSV assay is intended as an aid in  the diagnosis of influenza from Nasopharyngeal swab specimens and  should not be used as a sole basis for treatment. Nasal washings and  aspirates are unacceptable for Xpert Xpress SARS-CoV-2/FLU/RSV  testing.  Fact Sheet  for Patients: PinkCheek.be  Fact Sheet for Healthcare Providers: GravelBags.it  This test is not yet approved or cleared by the [place] FDA and  has been authorized for detection and/or diagnosis of SARS-CoV-2 by  FDA under an Emergency Use Authorization (EUA). This EUA will remain  in effect (meaning this test can be used) for the duration of the  Covid-19 declaration under Section 564(b)(1) of the Act, 21  U.S.C. section 360bbb-3(b)(1), unless the authorization is  terminated or revoked. Performed at [facility], [place] [address] [place], [place] [address]      Radiology Studies: No results found.  Scheduled Meds: . sodium chloride   Intravenous Once  . Chlorhexidine Gluconate Cloth  6 each Topical Daily  . enoxaparin  120 mg Subcutaneous Q24H  . feeding supplement  237 mL Oral BID BM  . fluticasone  2 spray Each Nare Daily  . metoprolol tartrate  5 mg Intravenous Q8H  . OLANZapine  2.5 mg Oral QHS  . pantoprazole (PROTONIX) IV  40 mg Intravenous Q24H  . polyethylene glycol  17 g Oral Daily  . senna-docusate  1 tablet Oral BID  . sodium chloride flush  3 mL Intravenous Q12H  . topiramate  50 mg Oral QHS   Continuous Infusions: . dextrose 5 % and 0.9% NaCl 100 mL/hr at 12/02/19 1340     LOS: 7 days   Marylu Lund, MD [facility] Pager On Amion  If 7PM-7AM, please contact night-coverage 12/02/2019, 4:12 PM

## 2019-12-03 ENCOUNTER — Inpatient Hospital Stay (HOSPITAL_COMMUNITY): Payer: BC Managed Care – PPO

## 2019-12-03 DIAGNOSIS — R9431 Abnormal electrocardiogram [ECG] [EKG]: Secondary | ICD-10-CM | POA: Diagnosis not present

## 2019-12-03 DIAGNOSIS — G43709 Chronic migraine without aura, not intractable, without status migrainosus: Secondary | ICD-10-CM | POA: Diagnosis not present

## 2019-12-03 DIAGNOSIS — C250 Malignant neoplasm of head of pancreas: Secondary | ICD-10-CM | POA: Diagnosis not present

## 2019-12-03 DIAGNOSIS — R112 Nausea with vomiting, unspecified: Secondary | ICD-10-CM | POA: Diagnosis not present

## 2019-12-03 DIAGNOSIS — D6481 Anemia due to antineoplastic chemotherapy: Secondary | ICD-10-CM | POA: Diagnosis not present

## 2019-12-03 DIAGNOSIS — K5900 Constipation, unspecified: Secondary | ICD-10-CM | POA: Diagnosis not present

## 2019-12-03 DIAGNOSIS — E876 Hypokalemia: Secondary | ICD-10-CM | POA: Diagnosis not present

## 2019-12-03 LAB — CBC
HCT: 27.9 % — ABNORMAL LOW (ref 36.0–46.0)
Hemoglobin: 9.3 g/dL — ABNORMAL LOW (ref 12.0–15.0)
MCH: 30.2 pg (ref 26.0–34.0)
MCHC: 33.3 g/dL (ref 30.0–36.0)
MCV: 90.6 fL (ref 80.0–100.0)
Platelets: 165 10*3/uL (ref 150–400)
RBC: 3.08 MIL/uL — ABNORMAL LOW (ref 3.87–5.11)
RDW: 15.6 % — ABNORMAL HIGH (ref 11.5–15.5)
WBC: 3.8 10*3/uL — ABNORMAL LOW (ref 4.0–10.5)
nRBC: 0 % (ref 0.0–0.2)

## 2019-12-03 LAB — MAGNESIUM: Magnesium: 2 mg/dL (ref 1.7–2.4)

## 2019-12-03 LAB — COMPREHENSIVE METABOLIC PANEL
ALT: 44 U/L (ref 0–44)
AST: 59 U/L — ABNORMAL HIGH (ref 15–41)
Albumin: 2.9 g/dL — ABNORMAL LOW (ref 3.5–5.0)
Alkaline Phosphatase: 98 U/L (ref 38–126)
Anion gap: 5 (ref 5–15)
BUN: <5 mg/dL — ABNORMAL LOW (ref 6–20)
CO2: 26 mmol/L (ref 22–32)
Calcium: 8.8 mg/dL — ABNORMAL LOW (ref 8.9–10.3)
Chloride: 106 mmol/L (ref 98–111)
Creatinine, Ser: 0.57 mg/dL (ref 0.44–1.00)
GFR, Estimated: >60 mL/min (ref >60)
Glucose, Bld: 110 mg/dL — ABNORMAL HIGH (ref 70–99)
Potassium: 3.2 mmol/L — ABNORMAL LOW (ref 3.5–5.1)
Sodium: 137 mmol/L (ref 135–145)
Total Bilirubin: 0.7 mg/dL (ref 0.3–1.2)
Total Protein: 6.2 g/dL — ABNORMAL LOW (ref 6.5–8.1)

## 2019-12-03 LAB — GLUCOSE, CAPILLARY: Glucose-Capillary: 109 mg/dL — ABNORMAL HIGH (ref 70–99)

## 2019-12-03 MED ORDER — PANCRELIPASE (LIP-PROT-AMYL) 12000-38000 UNITS PO CPEP
24000.0000 [IU] | ORAL_CAPSULE | Freq: Three times a day (TID) | ORAL | Status: DC
  Administered 2019-12-07 – 2019-12-12 (×15): 24000 [IU] via ORAL
  Filled 2019-12-03 (×17): qty 2

## 2019-12-03 MED ORDER — POTASSIUM CHLORIDE 10 MEQ/100ML IV SOLN
10.0000 meq | INTRAVENOUS | Status: AC
  Administered 2019-12-03 (×6): 10 meq via INTRAVENOUS
  Filled 2019-12-03 (×4): qty 100

## 2019-12-03 NOTE — Progress Notes (Signed)
Daily Progress Note   Patient Name: Cynthia Frye       Date: 12/03/2019 DOB: [birth year]  Age: 59 y.o. MRN#: [id number] Attending Physician: Donne Hazel, MD Primary Care Physician: Josetta Huddle, MD Admit Date: 11/24/2019  Reason for Consultation/Follow-up: Non pain symptom management  Subjective: I saw and examined Cynthia Frye today.  She was sitting in bed in no distress time of my encounter.  She appeared more comfortable today than yesterday.  We discussed her nausea and she reports that she does feel like the night went better for her.  Overall, she thinks the nausea is slightly better today than it was yesterday.  At the same time, she reports that it does seem to be worsening as the day goes on.  She states that she has had 3 episodes of vomiting today including one a few minutes prior to my visiting with her.  We reviewed results of abdominal film which did not show signs of obstruction.  Discussed options for care moving forward including continuation of current regimen with consideration for addition of Creon to see if pancreatic insufficiency may be playing a part in her current symptoms.  She is agreeable to this, but she does express some concern about being able to swallow if the pill is very large.  Length of Stay: 8  Current Medications: Scheduled Meds:  . sodium chloride   Intravenous Once  . Chlorhexidine Gluconate Cloth  6 each Topical Daily  . enoxaparin  120 mg Subcutaneous Q24H  . fluticasone  2 spray Each Nare Daily  . lipase/protease/amylase  24,000 Units Oral TID WC  . metoprolol tartrate  5 mg Intravenous Q8H  . OLANZapine  5 mg Oral QHS  . pantoprazole (PROTONIX) IV  40 mg Intravenous Q24H  . polyethylene glycol  17 g Oral Daily  . senna-docusate  1 tablet  Oral BID  . sodium chloride flush  3 mL Intravenous Q12H  . topiramate  50 mg Oral QHS    Continuous Infusions: . dextrose 5 % and 0.9% NaCl 100 mL/hr at 12/03/19 1017    PRN Meds: acetaminophen **OR** acetaminophen, hydrALAZINE, HYDROmorphone (DILAUDID) injection, hydrOXYzine, ketorolac, levalbuterol, loperamide, LORazepam, metoCLOPramide (REGLAN) injection, ondansetron (ZOFRAN) IV, oxyCODONE, prochlorperazine  Physical Exam         General: Alert, awake, in no acute  distress.  Heart: Regular rate and rhythm. No murmur appreciated. Lungs: Good air movement, clear Abdomen: Soft, nontender, nondistended, positive bowel sounds.  Ext: No significant edema Skin: Warm and dry Neuro: Grossly intact, nonfocal.   Vital Signs: BP (!) 157/84 (BP Location: Right Arm)   Pulse 82   Temp 97.7 F (36.5 C) (Oral)   Resp 17   Ht 5\' 7"  (1.702 m)   Wt 80.1 kg   SpO2 100%   BMI 27.66 kg/m  SpO2: SpO2: 100 % O2 Device: O2 Device: Room Air O2 Flow Rate:    Intake/output summary:   Intake/Output Summary (Last 24 hours) at 12/03/2019 2259 Last data filed at 12/03/2019 1740 Gross per 24 hour  Intake 2444.75 ml  Output 500 ml  Net 1944.75 ml   LBM: Last BM Date: 12/02/19 Baseline Weight: Weight: 79.4 kg Most recent weight: Weight: 80.1 kg       Palliative Assessment/Data:      Patient Active Problem List   Diagnosis Date Noted  . Malnutrition of moderate degree 11/26/2019  . 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 Constipation 11/25/2019  . Generalized abdominal pain   . Nausea and vomiting in adult 11/24/2019  . Dehydration 11/24/2019  . Prolonged QT interval 11/24/2019  . Emesis   . Tachycardia   . Genetic testing 11/12/2019  . Colitis 11/04/2019  . Acute deep vein thrombosis of left iliac vein (HCC) 11/04/2019  . Hypokalemia 11/04/2019  . Port-A-Cath in place 10/24/2019  . Goals of care, counseling/discussion 10/18/2019  . Pancreatic cancer ([place]) 10/02/2019   . Chronic migraine without aura without status migrainosus, not intractable 04/23/2019  . Migraine with aura and without status migrainosus, not intractable 04/23/2019  . Essential hypertension 04/29/2016  . Lupus (systemic lupus erythematosus) ([place]) 04/29/2016  . Asthma 04/29/2016  . Seasonal allergies 04/29/2016  . Abnormal stress test 04/09/2016    Palliative Care Assessment & Plan   Patient Profile: Ms. Cynthia Frye is a 59 year old female with past medical history of recently diagnosed metastatic pancreatic cancer who recently underwent second round of chemotherapy and was admitted with worsening nausea, vomiting, and abdominal pain.  She has had significant African hypokalemia and hypomagnesemia.  CT of the abdomen on exam was negative for bowel obstruction.  Urinalysis was concerning for UTI.  Of note, she had several day admission following first round of chemo with colitis and also suffering from symptoms of nausea and vomiting that was uncontrolled.  Palliative consulted for refractory nausea and vomiting. Assessment: Patient Active Problem List   Diagnosis Date Noted  . Malnutrition of moderate degree 11/26/2019  . 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 Constipation 11/25/2019  . Generalized abdominal pain   . Nausea and vomiting in adult 11/24/2019  . Dehydration 11/24/2019  . Prolonged QT interval 11/24/2019  . Emesis   . Tachycardia   . Genetic testing 11/12/2019  . Colitis 11/04/2019  . Acute deep vein thrombosis of left iliac vein (HCC) 11/04/2019  . Hypokalemia 11/04/2019  . Port-A-Cath in place 10/24/2019  . Goals of care, counseling/discussion 10/18/2019  . Pancreatic cancer ([place]) 10/02/2019  . Chronic migraine without aura without status migrainosus, not intractable 04/23/2019  . Migraine with aura and without status migrainosus, not intractable 04/23/2019  . Essential hypertension 04/29/2016  . Lupus (systemic lupus erythematosus) ([place]) 04/29/2016  .  Asthma 04/29/2016  . Seasonal allergies 04/29/2016  . Abnormal stress test 04/09/2016     Recommendations/Plan:  Nausea somewhat better today per patient report.  Abdominal  x-ray without sign of obstruction  She thinks the increase in olanzapine has been helpful.  Continue same.  Continue other antiemetics as needed including Zofran and Compazine.  We will plan for trial of Creon to see if this improves nausea that may be related to pancreatic insufficiency.  Continue to follow QTc.  QTc was 439 on telemetry measurement today.  Goals of Care and Additional Recommendations:  Limitations on Scope of Treatment: Full Scope Treatment  Code Status:    Code Status Orders  (From admission, onward)         Start     Ordered   11/24/19 1909  Full code  Continuous        11/24/19 1908        Code Status History    Date Active Date Inactive Code Status Order ID Comments User Context   11/04/2019 2125 11/09/2019 2207 Full Code [id number]  Rise Patience, MD ED   04/09/2016 1319 04/09/2016 1952 Full Code [id number]  Nelva Bush, MD Inpatient   Advance Care Planning Activity       Prognosis:   Unable to determine  Discharge Planning:  To Be Determined  Care plan was discussed with patient, interdisciplinary care team  Thank you for allowing the Palliative Medicine Team to assist in the care of this patient.   Time In: 1530 Time Out: 1610 Total Time 40 Prolonged Time Billed No      Greater than 50%  of this time was spent counseling and coordinating care related to the above assessment and plan.  Micheline Rough, MD  Please contact Palliative Medicine Team phone at [phone] for questions and concerns.

## 2019-12-03 NOTE — Progress Notes (Signed)
PT Cancellation Note  Patient Details Name: Cynthia Frye MRN: [id number] DOB: 09-26-1960   Cancelled Treatment:    Reason Eval/Treat Not Completed: Other (comment).  Pt was with another staff member, then told OT that she did not want to ruin a good day by tiring herself with another therapy session.  Follow up tomorrow.   Ramond Dial 12/03/2019, 3:32 PM   Mee Hives, PT MS Acute Rehab Dept. Number: [place] [facility] [place]

## 2019-12-03 NOTE — Progress Notes (Signed)
PROGRESS NOTE    Cynthia Frye  WUX:[id number] DOB: 05-22-1960 DOA: 11/24/2019 PCP: Josetta Huddle, MD    Brief Narrative:  59 year old unfortunate female history of recently diagnosed metastatic pancreatic adenocarcinoma with mets to the liver 09/19/2019, currently undergoing chemotherapy, recent chemotherapy 11/21/2019 presenting with 1 day history of worsening nausea, vomiting, abdominal pain.  Noted to have significant hypokalemia with inability to keep anything down.  CT abdomen and pelvis negative for small bowel obstruction.  Urinalysis concerning for UTI.  Patient started on IV antibiotics, scheduled IV antiemetics, IV fluids, potassium repletion.  Assessment & Plan:   Principal Problem:   Hypokalemia Active Problems:   Essential hypertension   Lupus (systemic lupus erythematosus) (HCC)   Seasonal allergies   Chronic migraine without aura without status migrainosus, not intractable   Pancreatic cancer (HCC)   Acute deep vein thrombosis of left iliac vein (HCC)   Nausea and vomiting in adult   Dehydration   Prolonged QT interval   Emesis   Tachycardia   Anemia associated with chemotherapy   Constipation   Generalized abdominal pain   Malnutrition of moderate degree   1 Hypokalemia and hypomagnesemia Secondary to GI losses from nausea and emesis postchemotherapy. Magnesium level at 1.7 on admission.  Potassium was 2.7 on admission. Potassium remains low at 3.2 Mg currently 2.0 Will replace K Will repeat bmet in AM  2. Nausea and vomiting Likely chemotherapy induced. Patient with recent chemotherapy 11/21/2019. Patient noted to have had similar episodes recently after first cycle of chemotherapy with nausea vomiting and diarrhea and had to be hospitalized from 11/04/2019-11/09/2019 in addition to colitis.    Patient on admission initially placed on scheduled IV Ativan, IV Decadron, Zofran as needed with improvement with nausea and emesis.   Continue IV  Decadron.  Continue as needed Zofran.  Continue IV fluids.   Cont with clears for now with eventual plan to advance diet when pt feels better Still with on-going nausea/vomiting despite multiple anti-emetics Appreciate input by palliative care who increased zyprexa to 5mg  Also recommendation to start creon  3. Dehydration Continue IV fluids and normal saline 100 cc/h.   Continue with clear liquids as tolerated Repeat bmet in AM  4. Tachycardia Likely secondary to volume depletion, nausea vomiting, possible beta-blockade withdrawal as patient unable to keep medications down.   Resolved. Continue hydration with IV fluids as tolerated Continue Lopressor IV scheduled until pt can reliably tolerate PO, then would change to PO   5. Prolonged QT interval Repeat EKG with resolution of QTC prolongation.  Keep magnesium >2, potassium >4.  Avoid QTC prolongation medications.   Continue with supportive care.  Cont to follow for now  6. Pancreatic adenocarcinoma in the head, with multiple liver lesionslikely liver metastases Recently diagnosed per CT scan on 09/19/2019, EUS biopsy per Dr. Paulita Fujita 09/25/2019 consistent with pancreatic adenocarcinoma. Patient status post cycle 1 FOLFIRINOX 10/24/2019. Patient stated started cycle 2 of chemotherapy 11/21/2019. Patient noted per med rec to be on Decadron 4 mg daily after chemotherapy for 3 to 5 days and as such due to nausea and vomiting patient started on Decadron 4 mg IV daily x3 days.   Oncology, Dr. Burr Medico is following. Greatly appreciate assistance by Oncology Continue with analgesic and antiemetics as pt tolerates  7. Recent left leg vein DVT Continued on full dose Lovenox.   -No evidence of acute blood loss at this time  8. Upper abdominal cramps -Likely secondary to metastatic pancreatic adenocarcinoma.    Continue to  ensure regular bowel movements given concurrent analgesics Continue with analgesia as needed Continue with PPI as  tolerated  9. Lupus Being followed in the outpatient setting by rheumatologist, Dr. Trudie Reed on weekly methotrexate injections, Plaquenil and diclofenac.  Recommend f/u with Rheumatology as outpatient when pt is ultimately discharged  10. Probable UTI Urinalysis with moderate leukocytes, nitrite negative, rare bacteria, 21-50 WBCs.   Urine cx remains pending Pt has completed 3 days IV Rocephin and remains stable  11. Anxiety Resume scheduled IV Ativan due to nausea and emesis.  Once tolerating oral intake could likely transition to oral Ativan as needed.  Continue supportive care.   12. Hypertension Patient still with nausea and emesis.   Pt is continued on IV Lopressor while pt is still nauseated Once tolerating oral intake could transition back to home regimen oral antihypertensive medications.   13.  Constipation Patient with large bowel movement RN after soapsuds enema was given. Diarrhea noted today Continue with imodium for diarrhea Abd xray personally reviewed. No acute process or obstruction  14.  Anemia of chronic disease/chemotherapy-induced anemia Likely chemo induced.  Patient denies any overt bleeding.  Consistent with anemia of chronic disease.   -Hgb was down to 7.2 on 11/3, given 1 unit PRBC -Hgb this AM now 9.3  DVT prophylaxis: Lovenox Code Status: Full Family Communication: Pt in room  Status is: Inpatient  Remains inpatient appropriate because:IV treatments appropriate due to intensity of illness or inability to take PO   Dispo: The patient is from: Home              Anticipated d/c is to: Home              Anticipated d/c date is: 3 days              Patient currently is not medically stable to d/c.  Consultants:   Oncology  Palliative Care  Procedures:     Antimicrobials: Anti-infectives (From admission, onward)   Start     Dose/Rate Route Frequency Ordered Stop   11/24/19 2000  cefTRIAXone (ROCEPHIN) 1 g in sodium chloride 0.9 %  100 mL IVPB  Status:  Discontinued        1 g 200 mL/hr over 30 Minutes Intravenous Every 24 hours 11/24/19 1912 11/27/19 0759      Subjective: Still nauseated, but feeling better today  Objective: Vitals:   12/02/19 1422 12/02/19 2059 12/03/19 0556 12/03/19 1359  BP: (!) 167/94 (!) 152/84 (!) 137/93 (!) 169/87  Pulse: 78 80 94 75  Resp: 18 17 16 16   Temp: 97.8 F (36.6 C) 98.1 F (36.7 C) 98.8 F (37.1 C) 98 F (36.7 C)  TempSrc: Oral Oral Oral Oral  SpO2: 100% 100% 100% 100%  Weight:   80.1 kg   Height:        Intake/Output Summary (Last 24 hours) at 12/03/2019 1706 Last data filed at 12/03/2019 1359 Gross per 24 hour  Intake 1200 ml  Output 500 ml  Net 700 ml   Filed Weights   11/24/19 1921 12/02/19 0224 12/03/19 0556  Weight: 80.8 kg 80.3 kg 80.1 kg    Examination: General exam: Conversant, in no acute distress Respiratory system: normal chest rise, clear, no audible wheezing Cardiovascular system: regular rhythm, s1-s2 Gastrointestinal system: Nondistended, nontender, pos BS Central nervous system: No seizures, no tremors Extremities: No cyanosis, no joint deformities Skin: No rashes, no pallor Psychiatry: Affect normal // no auditory hallucinations   Data Reviewed: I have personally  reviewed following labs and imaging studies  CBC: Recent Labs  Lab 11/27/19 0555 11/28/19 0601 11/29/19 0603 11/30/19 0443 12/01/19 0639 12/02/19 0622 12/03/19 0514  WBC 5.0   < > 5.4 4.1 3.5* 3.6* 3.8*  NEUTROABS 4.1  --   --   --   --   --   --   HGB 7.8*   < > 9.5* 9.6* 9.3* 9.2* 9.3*  HCT 22.8*   < > 27.8* 28.8* 28.3* 27.8* 27.9*  MCV 88.7   < > 88.8 90.0 90.4 91.1 90.6  PLT 176   < > 174 188 179 167 165   < > = values in this interval not displayed.   Basic Metabolic Panel: Recent Labs  Lab 11/28/19 0601 11/28/19 0601 11/29/19 0603 11/30/19 0443 12/01/19 0639 12/02/19 0622 12/03/19 0514  NA 137   < > 138 135 137 138 137  K 3.0*   < > 3.2* 3.0* 3.1*  3.1* 3.2*  CL 105   < > 104 100 104 106 106  CO2 23   < > 22 26 26 26 26   GLUCOSE 74   < > 73 108* 116* 116* 110*  BUN 7   < > <5* <5* <5* <5* <5*  CREATININE 0.57   < > 0.51 0.53 0.51 0.52 0.57  CALCIUM 8.4*   < > 8.8* 8.7* 8.7* 8.9 8.8*  MG 2.6*  --   --  1.5* 1.9 1.8 2.0  PHOS 2.5  --   --   --   --   --   --    < > = values in this interval not displayed.   GFR: Estimated Creatinine Clearance: 83.5 mL/min (by C-G formula based on SCr of 0.57 mg/dL). Liver Function Tests: Recent Labs  Lab 11/29/19 0603 11/30/19 0443 12/01/19 0639 12/02/19 0622 12/03/19 0514  AST 24 32 53* 69* 59*  ALT 22 20 28  41 44  ALKPHOS 103 91 95 96 98  BILITOT 1.0 0.9 0.7 0.8 0.7  PROT 6.2* 6.2* 6.2* 6.1* 6.2*  ALBUMIN 3.0* 2.9* 3.0* 2.9* 2.9*   No results for input(s): LIPASE, AMYLASE in the last 168 hours. No results for input(s): AMMONIA in the last 168 hours. Coagulation Profile: No results for input(s): INR, PROTIME in the last 168 hours. Cardiac Enzymes: No results for input(s): CKTOTAL, CKMB, CKMBINDEX, TROPONINI in the last 168 hours. BNP (last 3 results) No results for input(s): PROBNP in the last 8760 hours. HbA1C: No results for input(s): HGBA1C in the last 72 hours. CBG: Recent Labs  Lab 11/29/19 1026 11/30/19 0738 12/01/19 0630 12/02/19 0647 12/03/19 0743  GLUCAP 82 117* 111* 107* 109*   Lipid Profile: No results for input(s): CHOL, HDL, LDLCALC, TRIG, CHOLHDL, LDLDIRECT in the last 72 hours. Thyroid Function Tests: No results for input(s): TSH, T4TOTAL, FREET4, T3FREE, THYROIDAB in the last 72 hours. Anemia Panel: No results for input(s): VITAMINB12, FOLATE, FERRITIN, TIBC, IRON, RETICCTPCT in the last 72 hours. Sepsis Labs: No results for input(s): PROCALCITON, LATICACIDVEN in the last 168 hours.  Recent Results (from the past 240 hour(s))  Respiratory Panel by RT PCR (Flu A&B, Covid) - Nasopharyngeal Swab     Status: None   Collection Time: 11/24/19  3:34 PM    Specimen: Nasopharyngeal Swab  Result Value Ref Range Status   SARS Coronavirus 2 by RT PCR NEGATIVE NEGATIVE Final    Comment: (NOTE) SARS-CoV-2 target nucleic acids are NOT DETECTED.  The SARS-CoV-2 RNA is generally detectable in  upper respiratoy specimens during the acute phase of infection. The lowest concentration of SARS-CoV-2 viral copies this assay can detect is 131 copies/mL. A negative result does not preclude SARS-Cov-2 infection and should not be used as the sole basis for treatment or other patient management decisions. A negative result may occur with  improper specimen collection/handling, submission of specimen other than nasopharyngeal swab, presence of viral mutation(s) within the areas targeted by this assay, and inadequate number of viral copies (<131 copies/mL). A negative result must be combined with clinical observations, patient history, and epidemiological information. The expected result is Negative.  Fact Sheet for Patients:  PinkCheek.be  Fact Sheet for Healthcare Providers:  GravelBags.it  This test is no t yet approved or cleared by the [place] FDA and  has been authorized for detection and/or diagnosis of SARS-CoV-2 by FDA under an Emergency Use Authorization (EUA). This EUA will remain  in effect (meaning this test can be used) for the duration of the COVID-19 declaration under Section 564(b)(1) of the Act, 21 U.S.C. section 360bbb-3(b)(1), unless the authorization is terminated or revoked sooner.     Influenza A by PCR NEGATIVE NEGATIVE Final   Influenza B by PCR NEGATIVE NEGATIVE Final    Comment: (NOTE) The Xpert Xpress SARS-CoV-2/FLU/RSV assay is intended as an aid in  the diagnosis of influenza from Nasopharyngeal swab specimens and  should not be used as a sole basis for treatment. Nasal washings and  aspirates are unacceptable for Xpert Xpress SARS-CoV-2/FLU/RSV   testing.  Fact Sheet for Patients: PinkCheek.be  Fact Sheet for Healthcare Providers: GravelBags.it  This test is not yet approved or cleared by the [place] FDA and  has been authorized for detection and/or diagnosis of SARS-CoV-2 by  FDA under an Emergency Use Authorization (EUA). This EUA will remain  in effect (meaning this test can be used) for the duration of the  Covid-19 declaration under Section 564(b)(1) of the Act, 21  U.S.C. section 360bbb-3(b)(1), unless the authorization is  terminated or revoked. Performed at [facility], [place] [address], [place], [place] [address]      Radiology Studies: DG Abd 1 View  Result Date: 12/03/2019 CLINICAL DATA:  Diffuse abdominal pain EXAM: ABDOMEN - 1 VIEW COMPARISON:  11/24/2019 FINDINGS: The bowel gas pattern is normal. No radio-opaque calculi or other significant radiographic abnormality are seen. IMPRESSION: Negative. Electronically Signed   By: Inez Catalina M.D.   On: 12/03/2019 08:21    Scheduled Meds: . sodium chloride   Intravenous Once  . Chlorhexidine Gluconate Cloth  6 each Topical Daily  . enoxaparin  120 mg Subcutaneous Q24H  . fluticasone  2 spray Each Nare Daily  . lipase/protease/amylase  24,000 Units Oral TID WC  . metoprolol tartrate  5 mg Intravenous Q8H  . OLANZapine  5 mg Oral QHS  . pantoprazole (PROTONIX) IV  40 mg Intravenous Q24H  . polyethylene glycol  17 g Oral Daily  . senna-docusate  1 tablet Oral BID  . sodium chloride flush  3 mL Intravenous Q12H  . topiramate  50 mg Oral QHS   Continuous Infusions: . dextrose 5 % and 0.9% NaCl 100 mL/hr at 12/03/19 1017  . potassium chloride 10 mEq (12/03/19 1640)     LOS: 8 days   Marylu Lund, MD [facility] Pager On Amion  If 7PM-7AM, please contact night-coverage 12/03/2019, 5:06 PM

## 2019-12-03 NOTE — Progress Notes (Signed)
Occupational Therapy Treatment Patient Details Name: Cynthia Frye MRN: [id number] DOB: Aug 06, 1960 Today's Date: 12/03/2019    History of present illness 59 year old unfortunate female history of recently diagnosed metastatic pancreatic adenocarcinoma with mets to the liver 09/19/2019, currently undergoing chemotherapy, recent chemotherapy 11/21/2019 presenting with 1 day history of worsening nausea, vomiting, abdominal pain.  Noted to have significant hypokalemia, admitted to [facility]   OT comments  This 59 yo female stating this is the best day she has had since admission when it comes to N/V and did not want to do too much for fear it would send her day in the other direction. She did well with getting OOB and going to bathroom to stand at sink for grooming (min guard A at RW level). Pt will continue to benefit from acute OT without need for follow up.  Follow Up Recommendations  Supervision/Assistance - 24 hour    Equipment Recommendations  None recommended by OT       Precautions / Restrictions Precautions Precautions: Fall Precaution Comments: CHEMO precautions Restrictions Weight Bearing Restrictions: No       Mobility Bed Mobility Overal bed mobility: Modified Independent             General bed mobility comments: HOB up  Transfers Overall transfer level: Needs assistance Equipment used: Rolling walker (2 wheeled) Transfers: Sit to/from Stand Sit to Stand: Min guard         General transfer comment: VCs for safe hand placement    Balance Overall balance assessment: Needs assistance Sitting-balance support: No upper extremity supported;Feet supported Sitting balance-Leahy Scale: Good     Standing balance support: No upper extremity supported;During functional activity Standing balance-Leahy Scale: Fair Standing balance comment: standing at sink for oral care                           ADL either performed or assessed with clinical judgement    ADL Overall ADL's : Needs assistance/impaired     Grooming: Oral care;Min guard;Standing         Lower Body Bathing Details (indicate cue type and reason): Pt able to cross her legs to get to her feet while seated and min guard A sit<>stand       Lower Body Dressing Details (indicate cue type and reason): Pt able to cross her legs to get to her feet while seated and min guard A sit<>stand Toilet Transfer: Min guard;Ambulation;RW Toilet Transfer Details (indicate cue type and reason): simulated bed>to sink for grooming>bed           General ADL Comments: We did discuss a shower seat being more safe than  standing as of now and that one would allow her to enjoy taking a shower instead of getting so worn out by taking one. Made her aware insurance does not cover these.     Vision Patient Visual Report: No change from baseline            Cognition Arousal/Alertness: Awake/alert Behavior During Therapy: WFL for tasks assessed/performed;Flat affect Overall Cognitive Status: Within Functional Limits for tasks assessed                                                     Pertinent Vitals/ Pain       Pain Assessment: No/denies pain  Frequency  Min 2X/week        Progress Toward Goals  OT Goals(current goals can now be found in the care plan section)  Progress towards OT goals: Progressing toward goals  Acute Rehab OT Goals Patient Stated Goal: Be able to tolerate eating and moving without nausea/vomiting in order to get stronger. OT Goal Formulation: With patient Time For Goal Achievement: 12/09/19  Plan Discharge plan remains appropriate       AM-PAC OT "6 Clicks" Daily Activity     Outcome Measure   Help from another person eating meals?: None Help from another person taking care of personal grooming?: A Little Help from another person toileting, which includes using toliet, bedpan, or urinal?: A Little Help from another  person bathing (including washing, rinsing, drying)?: A Little Help from another person to put on and taking off regular upper body clothing?: A Little Help from another person to put on and taking off regular lower body clothing?: A Little 6 Click Score: 19    End of Session    OT Visit Diagnosis: Muscle weakness (generalized) (M62.81)   Activity Tolerance Patient tolerated treatment well   Patient Left in bed;with call bell/phone within reach;with bed alarm set       Time: 7903-8333 OT Time Calculation (min): 14 min  Charges: OT General Charges $OT Visit: 1 Visit OT Treatments $Self Care/Home Management : 8-22 mins  [address], OTR/L Acute Rehab Services Pager [phone] Office [phone]        Almon Register 12/03/2019, 3:47 PM

## 2019-12-03 NOTE — Progress Notes (Addendum)
HEMATOLOGY-ONCOLOGY PROGRESS NOTE  SUBJECTIVE: The patient initially reported that her nausea was better with higher dose of Zyprexa.  However, during my visit, she developed worsening abdominal discomfort is started to vomit.  Oncology History Overview Note  Cancer Staging Pancreatic cancer [facility]) Staging form: Exocrine Pancreas, AJCC 8th Edition - Clinical stage from 10/18/2019: Stage IV (cT3, cN1, cM1) - Signed by Truitt Merle, MD on 10/18/2019    Pancreatic cancer ([place])  09/19/2019 Imaging   CT AP w contrast 09/19/19  IMPRESSION: 1. Findings are highly concerning for probable primary pancreatic adenocarcinoma in the anterior aspect of the pancreatic head. Several prominent borderline enlarged lymph nodes are noted in the hepatoduodenal nodal station, and there are multiple indeterminate liver lesions which are highly concerning for probable hepatic metastases. Further evaluation with nonemergent abdominal MRI with and without IV gadolinium with MRCP is recommended in the near future to better evaluate these findings.   09/25/2019 Procedure   Upper EUS by Dr Paulita Fujita  IMPRESSION -There was no evidence of significant pathology in the left lobe of the liver.  -A few lymph nodes were visualized and measures in the peripancreatic region and porta hepata region.  -Hyperchoic material consistent with sludge was visualized endosonographically in the gallbladder.  -There was no sign of significant pathology in the common bile duct.  -A mass was identified in the pancreatic head. If biopsy results show adenocarcinoma, it would be staged T3N1Mx by endosonographic criteria. Fine needle aspiration performed.    09/25/2019 Initial Biopsy   FINAL MICROSCOPIC DIAGNOSIS: Fine needle aspirate, Pancreas;  MALIGNANT CELLS PRESENT CONSISTENT WITH ADENOCARCINOMA.    10/02/2019 Initial Diagnosis   Pancreatic cancer ([place])   10/11/2019 Procedure   PAC placement y Dr Barry Dienes    10/15/2019 Imaging   CT Chest   IMPRESSION: 1. Small anterior left pneumothorax with dependent atelectasis in the left lower lobe. 2. Increased number of bilateral axillary and subpectoral lymph nodes with mild lymphadenopathy in the left axilla. While this would be an atypical presentation for metastatic pancreatic cancer, this possibility is not excluded 3. Main duct dilatation in the pancreas, better assessed on abdomen CT 09/19/2019.   10/16/2019 Imaging   MRI Abdomen  IMPRESSION: 1. Substantially motion degraded scan. 2. Probable persistent small anterior left lung base pneumothorax, better seen on chest CT from 1 day prior. 3. Poorly marginated hypoenhancing 3.7 x 2.9 cm pancreatic head mass, which appears to invade the anterior peripancreatic fat, compatible with known pancreatic adenocarcinoma. Diffuse irregular dilatation of the main pancreatic duct in the pancreatic body and tail. Mild narrowing of the main portal vein by the mass. Abdominal vasculature remains patent and otherwise uninvolved. 4. Numerous (greater than 10) small liver masses scattered throughout the liver, largest 1.0 cm, which appear to demonstrate targetoid enhancement on the limited motion degraded postcontrast sequences, compatible with liver metastases. 5. Mild porta hepatis adenopathy, suspicious for metastatic disease.   10/18/2019 Cancer Staging   Staging form: Exocrine Pancreas, AJCC 8th Edition - Clinical stage from 10/18/2019: Stage IV (cT3, cN1, cM1) - Signed by Truitt Merle, MD on 10/18/2019   10/24/2019 -  Chemotherapy   First-line FOLFIRINOX q2weeks starting 10/24/19   10/30/2019 Pathology Results   FINAL MICROSCOPIC DIAGNOSIS:   A. LIVER, LESION, BIOPSY:  -  Metastatic carcinoma  -  See comment   COMMENT:   By immunohistochemistry, the neoplastic cells are positive for  cytokeratin 7 and GATA3 with patchy nonspecific staining for PAX 8 but  negative for TTF-1, CDX2 and cytokeratin  20.  Overall, the findings are   consistent with metastasis of the patient's known breast carcinoma.  Prognostic panel (ER, PR, Her-2) is pending and will be reported in an  addendum.    ADDENDUM:   Dr. Laurence Ferrari notified us (November 01, 2019) that the patient was also  being worked up for a pancreatic mass.  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  In addition, ER and PR  are negative.  Gata-3 can be expressed in the pancreatic  adenocarcinomas; therefore, pancreatobiliary primary remains in the  differential.    11/02/2019 Genetic Testing   Negative genetic testing: no pathogenic variants detected in [facility] Common Hereditary Cancers Panel.  The report date is November 02, 2019.   The Common Hereditary Cancers Panel offered by [facility] includes sequencing and/or deletion duplication testing of the following 48 genes: APC, ATM, AXIN2, BARD1, BMPR1A, BRCA1, BRCA2, BRIP1, CDH1, CDK4, CDKN2A (p14ARF), CDKN2A (p16INK4a), CHEK2, CTNNA1, DICER1, EPCAM (Deletion/duplication testing only), GREM1 (promoter region deletion/duplication testing only), KIT, MEN1, MLH1, MSH2, MSH3, MSH6, MUTYH, NBN, NF1, NHTL1, PALB2, PDGFRA, PMS2, POLD1, POLE, PTEN, RAD50, RAD51C, RAD51D, RNF43, SDHB, SDHC, SDHD, SMAD4, SMARCA4. STK11, TP53, TSC1, TSC2, and VHL.  The following genes were evaluated for sequence changes only: SDHA and HOXB13 c.251G>A variant only.   11/04/2019 Imaging   CT AP  IMPRESSION: 1. Circumferential bowel wall thickening with adjacent fat stranding throughout the colon, most predominant in the transverse colon. This is consistent with colitis, which may be infectious or inflammatory in etiology. 2. Ill-defined pancreatic head mass consistent with known malignancy, similar to mildly increased in comparison to prior CT. There are innumerable hypodense masses throughout the liver, increased in conspicuity in comparison to prior CT. Findings are worrisome for worsening metastatic  disease. 3. Filling defect in the LEFT internal iliac vein, likely a thrombus with differential considerations including mixing artifact.      REVIEW OF SYSTEMS:   Constitutional: Denies fevers, chills  Respiratory: Denies cough, dyspnea or wheezes Cardiovascular: Denies palpitation, chest discomfort Gastrointestinal: Developed abdominal discomfort with nausea and vomiting during my visit. Denies diarrhea.  Skin: Denies abnormal skin rashes Lymphatics: Denies new lymphadenopathy or easy bruising Neurological:Denies numbness, tingling or new weaknesses Behavioral/Psych: Mood is stable, no new changes  Extremities: No lower extremity edema All other systems were reviewed with the patient and are negative.  I have reviewed the past medical history, past surgical history, social history and family history with the patient and they are unchanged from previous note.   PHYSICAL EXAMINATION: ECOG PERFORMANCE STATUS: 3  Vitals:   12/02/19 2059 12/03/19 0556  BP: (!) 152/84 (!) 137/93  Pulse: 80 94  Resp: 17 16  Temp: 98.1 F (36.7 C) 98.8 F (37.1 C)  SpO2: 100% 100%   Filed Weights   11/24/19 1921 12/02/19 0224 12/03/19 0556  Weight: 80.8 kg 80.3 kg 80.1 kg    Intake/Output from previous day: 11/07 0701 - 11/08 0700 In: 1200 [I.V.:1200] Out: -   GENERAL:alert, no distress and comfortable SKIN: skin color, texture, turgor are normal, no rashes or significant lesions LUNGS: clear to auscultation and percussion with normal breathing effort HEART: regular rate & rhythm and no murmurs and no lower extremity edema ABDOMEN: Positive bowel sounds, soft, generalized tenderness Musculoskeletal:no cyanosis of digits and no clubbing  NEURO: alert & oriented x 3 with fluent speech, no focal motor/sensory deficits  LABORATORY DATA:  I have reviewed the data as listed CMP Latest Ref Rng &  Units 12/03/2019 12/02/2019 12/01/2019  Glucose 70 - 99 mg/dL 110(H) 116(H) 116(H)  BUN 6 - 20  mg/dL <5(L) <5(L) <5(L)  Creatinine 0.44 - 1.00 mg/dL 0.57 0.52 0.51  Sodium 135 - 145 mmol/L 137 138 137  Potassium 3.5 - 5.1 mmol/L 3.2(L) 3.1(L) 3.1(L)  Chloride 98 - 111 mmol/L 106 106 104  CO2 22 - 32 mmol/L '26 26 26  ' Calcium 8.9 - 10.3 mg/dL 8.8(L) 8.9 8.7(L)  Total Protein 6.5 - 8.1 g/dL 6.2(L) 6.1(L) 6.2(L)  Total Bilirubin 0.3 - 1.2 mg/dL 0.7 0.8 0.7  Alkaline Phos 38 - 126 U/L 98 96 95  AST 15 - 41 U/L 59(H) 69(H) 53(H)  ALT 0 - 44 U/L 44 41 28    Lab Results  Component Value Date   WBC 3.8 (L) 12/03/2019   HGB 9.3 (L) 12/03/2019   HCT 27.9 (L) 12/03/2019   MCV 90.6 12/03/2019   PLT 165 12/03/2019   NEUTROABS 4.1 11/27/2019    DG Chest 2 View  Result Date: 11/24/2019 CLINICAL DATA:  Nausea and vomiting. EXAM: CHEST - 2 VIEW COMPARISON:  November 04, 2019 FINDINGS: There is stable left-sided venous Port-A-Cath positioning. The heart size and mediastinal contours are within normal limits. Both lungs are clear. The visualized skeletal structures are unremarkable. IMPRESSION: No active cardiopulmonary disease. Electronically Signed   By: Virgina Norfolk M.D.   On: 11/24/2019 18:52   DG Abd 1 View  Result Date: 12/03/2019 CLINICAL DATA:  Diffuse abdominal pain EXAM: ABDOMEN - 1 VIEW COMPARISON:  11/24/2019 FINDINGS: The bowel gas pattern is normal. No radio-opaque calculi or other significant radiographic abnormality are seen. IMPRESSION: Negative. Electronically Signed   By: Inez Catalina M.D.   On: 12/03/2019 08:21   CT Abdomen Pelvis W Contrast  Result Date: 11/24/2019 CLINICAL DATA:  Abdominal distension, pain, nausea, vomiting. Pancreatic cancer EXAM: CT ABDOMEN AND PELVIS WITH CONTRAST TECHNIQUE: Multidetector CT imaging of the abdomen and pelvis was performed using the standard protocol following bolus administration of intravenous contrast. CONTRAST:  160m OMNIPAQUE IOHEXOL 300 MG/ML  SOLN COMPARISON:  11/04/2019 FINDINGS: Lower chest: Scarring at the left lung base.   No acute abnormality. Hepatobiliary: Numerous lesions throughout the liver, enlarging since prior study. Index right hepatic dome lesion measures 2.1 cm compared to 1.6 cm previously. Inferior right hepatic lesion measures 1.4 cm compared with 9 mm previously. Gallbladder unremarkable. Pancreas: Pancreatic head mass measuring up to 3.4 cm compared to 3.2 cm previously, likely not significantly changed. Pancreatic ductal dilatation in the body and tail. Spleen: No focal abnormality.  Normal size. Adrenals/Urinary Tract: No adrenal abnormality. No focal renal abnormality. No stones or hydronephrosis. Urinary bladder is unremarkable. Stomach/Bowel: Stomach, large and small bowel grossly unremarkable. Normal appendix. Vascular/Lymphatic: No evidence of aneurysm or adenopathy. Reproductive: No mass or abnormality noted. Other: No free fluid or free air. Musculoskeletal: No acute bony abnormality. IMPRESSION: Pancreatic head mass again noted compatible with patient's given history of pancreatic cancer. Numerous metastases throughout the liver, enlarging since prior study. Electronically Signed   By: KRolm BaptiseM.D.   On: 11/24/2019 17:02   CT ABDOMEN PELVIS W CONTRAST  Result Date: 11/04/2019 CLINICAL DATA:  Nausea and vomiting, history of pancreatic cancer EXAM: CT ABDOMEN AND PELVIS WITH CONTRAST TECHNIQUE: Multidetector CT imaging of the abdomen and pelvis was performed using the standard protocol following bolus administration of intravenous contrast. CONTRAST:  1074mOMNIPAQUE IOHEXOL 300 MG/ML  SOLN COMPARISON:  September 19, 2019. FINDINGS: Lower chest: No acute  abnormality.  LEFT basilar atelectasis. Hepatobiliary: There are innumerable hypodense masses throughout the liver, increased in conspicuity in comparison to prior study. Representative mass of the hepatic dome measures 16 mm, previously 7 mm (series 2, image 8). Newly conspicuous mass of the RIGHT inferior liver measures 12 mm (series 2, image 28).  Gallbladder is unremarkable. There is mild central biliary ductal prominence. Pancreas: Ill-defined pancreatic mass of the pancreatic head measures approximately 3.2 x 1.9 by 3.3 cm, similar to mildly increased in comparison to prior. There is upstream pancreatic ductal dilation, unchanged in comparison to prior. This mass directly abuts the duodenum. There is adjacent fat stranding along the head of the pancreas, similar in comparison to prior. Spleen: Unremarkable. Adrenals/Urinary Tract: Adrenals are unremarkable. Kidneys enhance symmetrically. No hydronephrosis. Bladder is unremarkable. Stomach/Bowel: No evidence of bowel obstruction. There is circumferential bowel wall thickening with adjacent fat stranding throughout the colon. This is most predominant in the transverse colon. Appendix is unremarkable. Vascular/Lymphatic: Peripancreatic lymph node measures 9 mm, unchanged (series 2, image 28). There is a filling defect in the LEFT internal iliac vein. Minimal atherosclerotic calcifications. Aortic branches are patent. Reproductive: Status post hysterectomy. Other: No free air. Musculoskeletal: No acute or significant osseous findings. IMPRESSION: 1. Circumferential bowel wall thickening with adjacent fat stranding throughout the colon, most predominant in the transverse colon. This is consistent with colitis, which may be infectious or inflammatory in etiology. 2. Ill-defined pancreatic head mass consistent with known malignancy, similar to mildly increased in comparison to prior CT. There are innumerable hypodense masses throughout the liver, increased in conspicuity in comparison to prior CT. Findings are worrisome for worsening metastatic disease. 3. Filling defect in the LEFT internal iliac vein, likely a thrombus with differential considerations including mixing artifact. Electronically Signed   By: [name] MD   On: 11/04/2019 19:10   DG Chest Port 1 View  Result Date: 11/04/2019 CLINICAL  DATA:  Nausea, vomiting and diarrhea since chemotherapy 2 weeks prior, history of pancreatic cancer EXAM: PORTABLE CHEST 1 VIEW COMPARISON:  Radiograph 10/28/2019 FINDINGS: Accessed left subclavian approach Port-A-Cath tip terminates at the superior cavoatrial junction in similar position to prior. Telemetry leads overlie the chest. Stable streaky opacities in the left lung base likely reflecting scarring or subsegmental atelectasis, less likely infection. No new consolidative opacity. No pneumothorax or effusion. The cardiomediastinal contours are unremarkable. No acute osseous or soft tissue abnormality. IMPRESSION: Stable streaky opacities in the left lung base likely reflecting scarring or subsegmental atelectasis, less likely infection. No new acute cardiopulmonary abnormality. Electronically Signed   By: Lovena Le M.D.   On: 11/04/2019 20:59   VAS [place] LOWER EXTREMITY VENOUS (DVT) ([facility] and [facility] 7a-7p)  Result Date: 11/05/2019  Lower Venous DVT Study Indications: CT Abdomen filling defect.  Risk Factors: Cancer. Comparison Study: No prior studies. Performing Technologist: Oliver Hum RVT  Examination Guidelines: A complete evaluation includes B-mode imaging, spectral Doppler, color Doppler, and power Doppler as needed of all accessible portions of each vessel. Bilateral testing is considered an integral part of a complete examination. Limited examinations for reoccurring indications may be performed as noted. The reflux portion of the exam is performed with the patient in reverse Trendelenburg.  +-----+---------------+---------+-----------+----------+--------------+ RIGHTCompressibilityPhasicitySpontaneityPropertiesThrombus Aging +-----+---------------+---------+-----------+----------+--------------+ CFV  Full           Yes      Yes                                 +-----+---------------+---------+-----------+----------+--------------+    +---------+---------------+---------+-----------+----------+--------------+  LEFT     CompressibilityPhasicitySpontaneityPropertiesThrombus Aging +---------+---------------+---------+-----------+----------+--------------+ CFV      Full           Yes      Yes                                 +---------+---------------+---------+-----------+----------+--------------+ SFJ      Full                                                        +---------+---------------+---------+-----------+----------+--------------+ FV Prox  Full                                                        +---------+---------------+---------+-----------+----------+--------------+ FV Mid   Full                                                        +---------+---------------+---------+-----------+----------+--------------+ FV DistalFull                                                        +---------+---------------+---------+-----------+----------+--------------+ PFV      Full                                                        +---------+---------------+---------+-----------+----------+--------------+ POP      Full           Yes      Yes                                 +---------+---------------+---------+-----------+----------+--------------+ PTV      Full                                                        +---------+---------------+---------+-----------+----------+--------------+ PERO     Full                                                        +---------+---------------+---------+-----------+----------+--------------+ EIV                     Yes      Yes                                 +---------+---------------+---------+-----------+----------+--------------+  CIV                                                   Not visualized +---------+---------------+---------+-----------+----------+--------------+     Summary: RIGHT: - No evidence of common  femoral vein obstruction.  LEFT: - There is no evidence of deep vein thrombosis in the lower extremity.  - No cystic structure found in the popliteal fossa.  *See table(s) above for measurements and observations. Electronically signed by Monica Martinez MD on 11/05/2019 at 5:21:58 PM.    Final     ASSESSMENT AND PLAN: 1.  Metastatic pancreatic adenocarcinoma 2.  Intractable nausea and vomiting secondary to chemotherapy, recovering slowly  3.  Dehydration, improved 4.  Anemia secondary to chemotherapy 5.  Left iliac vein DVT 6.  Lupus 7.  Hypertension 8.  Migraines 9.  Anxiety  -she remains to be very symptomatic, and I think her symptoms are partially related to her metastatic pancreatic cancer which has recently progressed. Her CT from 10/30 did not show any signs of bowel obstruction, but given the location of her pancreatic cancer in the head, which try cause obstruction to duodenum.  Abdominal x-ray from this morning was negative. -Olanzapine dose increased to 5 mg last evening.  She initially reported some improvement but subsequently developed nausea and vomiting.  Discussed with palliative care who will continue to adjust her antiemetics. -may consider a trial of Creon to see if some of her GI symptoms are related to pancreatic insufficiency?   Mikey Bussing  12/03/2019      LOS: 8 days    Addendum  Pt has persistent N/V, still on clear liquid and not able to keep much down.  I appreciate active care Dr. Kirstie Mirza input. She is on very high dose antiemtics now, I do suspect her N/V is mainly from her cancer. Plan to give her a trial of creon.   She has not had much nutrition since he came into the hospital for over a week, I am very concerned about her severe malnutrition, and I recommend a short course of TPN (2-4 weeks), with the goal of get her little stronger, so I may be able to give her some chemo (second line gemcitabine w or wo Abraxane), given her young age and has not  had much chemo, before I recommend hospice.  We discussed the benefit and sent especially risk of infection.  She is interested. We can start tomorrow.  I will f/u on Wednesday.   Truitt Merle  12/03/2019

## 2019-12-03 NOTE — Plan of Care (Signed)
Pt BP elevated.  Scheduled meds administered per MAR.  Pt continues to experience significant nausea despite multiple anti-emetics that are being administered.   Problem: Clinical Measurements: Goal: Will remain free from infection Outcome: Progressing Goal: Diagnostic test results will improve Outcome: Progressing Goal: Respiratory complications will improve Outcome: Progressing Goal: Cardiovascular complication will be avoided Outcome: Progressing   Problem: Coping: Goal: Level of anxiety will decrease Outcome: Progressing   Problem: Elimination: Goal: Will not experience complications related to bowel motility Outcome: Progressing Goal: Will not experience complications related to urinary retention Outcome: Progressing   Problem: Pain Managment: Goal: General experience of comfort will improve Outcome: Progressing   Problem: Safety: Goal: Ability to remain free from injury will improve Outcome: Progressing   Problem: Skin Integrity: Goal: Risk for impaired skin integrity will decrease Outcome: Progressing   Problem: Education: Goal: Knowledge of General Education information will improve Description: Including pain rating scale, medication(s)/side effects and non-pharmacologic comfort measures Outcome: Not Progressing   Problem: Health Behavior/Discharge Planning: Goal: Ability to manage health-related needs will improve Outcome: Not Progressing   Problem: Clinical Measurements: Goal: Ability to maintain clinical measurements within normal limits will improve Outcome: Not Progressing   Problem: Activity: Goal: Risk for activity intolerance will decrease Outcome: Not Progressing   Problem: Nutrition: Goal: Adequate nutrition will be maintained Outcome: Not Progressing

## 2019-12-03 NOTE — Consult Note (Addendum)
Palliative care consult note  Reason for consult: Symptom management/recurrent nausea vomiting in light of pancreatic cancer.  Palliative care consult received.  Chart reviewed including personal review of pertinent labs and imaging.  Discussed case with Dr. Wyline Copas.  Briefly, Cynthia Frye is a 59 year old female with past medical history of recently diagnosed metastatic pancreatic cancer who recently underwent second round of chemotherapy and was admitted with worsening nausea, vomiting, and abdominal pain.  She has had significant African hypokalemia and hypomagnesemia.  CT of the abdomen on exam was negative for bowel obstruction.  Urinalysis was concerning for UTI.  Of note, she had several day admission following first round of chemo with colitis and also suffering from symptoms of nausea and vomiting that was uncontrolled.  Palliative consulted for refractory nausea and vomiting.  I saw and examined Cynthia Frye.  Her husband, Cynthia Frye, was at the bedside as well.  She was vomiting on my arrival.  We discussed the severity of her symptoms and she reports feeling "100s" of emesis bag over the past week.  She is unable to tolerate intake other than a few sips of liquids before she has recurrent vomiting.  I reviewed her current regimen with her which includes multiple antiemetic medications.  In the past 24 hours she has had steroids, Zofran x4 doses, Compazine x2 doses, Reglan x1 dose, and low-dose of olanzapine (2.5 mg) at night.  We discussed nausea and vomiting related to cancer and chemotherapy.  We also discussed the fact that she had had a prolonged QT on prior admission and that this was also factored into consideration when reviewing potential regimens and care plans.  Cynthia Frye reports in her note consideration for increasing olanzapine but this was not done due to concern from prior prolonged QT.  I spoke with Cynthia Frye that she is on multiple medications that can prolong QT including some  extent of prolongation from each of the antiemetics she is on.  On the last EKG in her file, QTC was 440.  I checked again with telemetry this evening and QTC was 439.  We talked about refractory nausea and vomiting, particularly delayed nausea related to chemotherapy.  Discussed recommendation that most evidence or accepted recommendations from medical societies recommend focusing on utilization of olanzapine in situations of delayed nausea/vomiting related to chemotherapy.  I discussed risks and benefits of increasing olanzapine with her and Cynthia Frye this evening.  She reports that she would try "anything" at this point in time due to the fact she is so miserable.  Recommendations: -Discussed with Dr. Wyline Copas.  While imaging she had on admission was negative for obstruction, she reports that she felt better at first and then worse again.  Recommend checking abdominal x-ray to ensure no signs of obstruction. -Discussed risk versus benefit of antiemetics prior prolonged QT.  While we certainly need to be judicious with medications, particularly in light of his significant hypokalemia/hypomagnesemia, her QTC today was 439.  Additionally, due to how miserable she has been she has been receiving multiple doses of other antiemetics which can also prolong QT.  My recommendation was to increase Zyprexa to 5 mg nightly beginning this evening.  She was agreeable to this change. -I also see Cynthia Frye noted potential for trial of pancreatic enzymes.  I do think this certainly sounds reasonable, but I discussed with Cynthia Frye today a plan to get abdominal x-ray to rule out obstruction prior to trial of this. -We will plan to follow-up tomorrow.  Start time: 15  End time: 1850 Total time: 60 minutes  Greater than 50%  of this time was spent counseling and coordinating care related to the above assessment and plan.  Cynthia Rough, MD [facility] Team [phone]

## 2019-12-04 DIAGNOSIS — G43709 Chronic migraine without aura, not intractable, without status migrainosus: Secondary | ICD-10-CM | POA: Diagnosis not present

## 2019-12-04 DIAGNOSIS — E876 Hypokalemia: Secondary | ICD-10-CM | POA: Diagnosis not present

## 2019-12-04 DIAGNOSIS — T451X5A Adverse effect of antineoplastic and immunosuppressive drugs, initial encounter: Secondary | ICD-10-CM | POA: Diagnosis not present

## 2019-12-04 DIAGNOSIS — R112 Nausea with vomiting, unspecified: Secondary | ICD-10-CM | POA: Diagnosis not present

## 2019-12-04 DIAGNOSIS — D6481 Anemia due to antineoplastic chemotherapy: Secondary | ICD-10-CM | POA: Diagnosis not present

## 2019-12-04 DIAGNOSIS — Z515 Encounter for palliative care: Secondary | ICD-10-CM | POA: Diagnosis not present

## 2019-12-04 LAB — GLUCOSE, CAPILLARY: Glucose-Capillary: 103 mg/dL — ABNORMAL HIGH (ref 70–99)

## 2019-12-04 LAB — COMPREHENSIVE METABOLIC PANEL
ALT: 50 U/L — ABNORMAL HIGH (ref 0–44)
AST: 62 U/L — ABNORMAL HIGH (ref 15–41)
Albumin: 3 g/dL — ABNORMAL LOW (ref 3.5–5.0)
Alkaline Phosphatase: 103 U/L (ref 38–126)
Anion gap: 6 (ref 5–15)
BUN: <5 mg/dL — ABNORMAL LOW (ref 6–20)
CO2: 25 mmol/L (ref 22–32)
Calcium: 9.1 mg/dL (ref 8.9–10.3)
Chloride: 105 mmol/L (ref 98–111)
Creatinine, Ser: 0.56 mg/dL (ref 0.44–1.00)
GFR, Estimated: >60 mL/min (ref >60)
Glucose, Bld: 115 mg/dL — ABNORMAL HIGH (ref 70–99)
Potassium: 2.9 mmol/L — ABNORMAL LOW (ref 3.5–5.1)
Sodium: 136 mmol/L (ref 135–145)
Total Bilirubin: 0.7 mg/dL (ref 0.3–1.2)
Total Protein: 6.3 g/dL — ABNORMAL LOW (ref 6.5–8.1)

## 2019-12-04 LAB — CBC
HCT: 28.5 % — ABNORMAL LOW (ref 36.0–46.0)
Hemoglobin: 9.2 g/dL — ABNORMAL LOW (ref 12.0–15.0)
MCH: 29.8 pg (ref 26.0–34.0)
MCHC: 32.3 g/dL (ref 30.0–36.0)
MCV: 92.2 fL (ref 80.0–100.0)
Platelets: 162 10*3/uL (ref 150–400)
RBC: 3.09 MIL/uL — ABNORMAL LOW (ref 3.87–5.11)
RDW: 15.7 % — ABNORMAL HIGH (ref 11.5–15.5)
WBC: 3.7 10*3/uL — ABNORMAL LOW (ref 4.0–10.5)
nRBC: 0 % (ref 0.0–0.2)

## 2019-12-04 LAB — MAGNESIUM: Magnesium: 1.8 mg/dL (ref 1.7–2.4)

## 2019-12-04 MED ORDER — MAGNESIUM SULFATE 2 GM/50ML IV SOLN
2.0000 g | Freq: Once | INTRAVENOUS | Status: AC
  Administered 2019-12-04: 2 g via INTRAVENOUS
  Filled 2019-12-04: qty 50

## 2019-12-04 MED ORDER — POTASSIUM CHLORIDE 10 MEQ/100ML IV SOLN
10.0000 meq | INTRAVENOUS | Status: AC
  Administered 2019-12-04 (×6): 10 meq via INTRAVENOUS
  Filled 2019-12-04 (×4): qty 100

## 2019-12-04 NOTE — Progress Notes (Signed)
PROGRESS NOTE    Cynthia Frye  ZWC:[id number] DOB: May 06, 1960 DOA: 11/24/2019 PCP: Josetta Huddle, MD    Brief Narrative:  59 year old unfortunate female history of recently diagnosed metastatic pancreatic adenocarcinoma with mets to the liver 09/19/2019, currently undergoing chemotherapy, recent chemotherapy 11/21/2019 presenting with 1 day history of worsening nausea, vomiting, abdominal pain.  Noted to have significant hypokalemia with inability to keep anything down.  CT abdomen and pelvis negative for small bowel obstruction.  Urinalysis concerning for UTI.  Patient started on IV antibiotics, scheduled IV antiemetics, IV fluids, potassium repletion.  Assessment & Plan:   Principal Problem:   Hypokalemia Active Problems:   Essential hypertension   Lupus (systemic lupus erythematosus) (HCC)   Seasonal allergies   Chronic migraine without aura without status migrainosus, not intractable   Pancreatic cancer (HCC)   Acute deep vein thrombosis of left iliac vein (HCC)   Nausea and vomiting in adult   Dehydration   Prolonged QT interval   Emesis   Tachycardia   Anemia associated with chemotherapy   Constipation   Generalized abdominal pain   Malnutrition of moderate degree   1 Hypokalemia and hypomagnesemia Secondary to GI losses from nausea and emesis postchemotherapy. Magnesium level at 1.7 on admission.  Potassium was 2.7 on admission. Potassium remains low at 2.9 Mg currently 1.8 Replace K Will repeat bmet in AM  2. Nausea and vomiting Likely chemotherapy induced. Patient with recent chemotherapy 11/21/2019. Patient noted to have had similar episodes recently after first cycle of chemotherapy with nausea vomiting and diarrhea and had to be hospitalized from 11/04/2019-11/09/2019 in addition to colitis.    Patient has been continued on multiple anti-emetics without improvement Continued on clears for now, although pt is not tolerating  Trial of Creon started per  Oncology recs Discussed with Oncology. Recommendation to start TPN given poor po intake  3. Dehydration Continue IV fluids as tolerated Continued on trial of clear liquids Repeat bmet in AM  4. Tachycardia Likely secondary to volume depletion, nausea vomiting, possible beta-blockade withdrawal as patient unable to keep medications down.   Resolved. Continue hydration with IV fluids as tolerated Continue Lopressor IV scheduled until pt can reliably tolerate PO, then would change to PO   5. Prolonged QT interval Resolved Keep magnesium >2, potassium >4.  Avoid QTC prolongation medications if possible, although pt is currently tolerating anti-emetics  6. Pancreatic adenocarcinoma in the head, with multiple liver lesionslikely liver metastases Recently diagnosed per CT scan on 09/19/2019, EUS biopsy per Dr. Paulita Fujita 09/25/2019 consistent with pancreatic adenocarcinoma. Patient status post cycle 1 FOLFIRINOX 10/24/2019. Patient stated started cycle 2 of chemotherapy 11/21/2019. Patient noted per med rec to be on Decadron 4 mg daily after chemotherapy for 3 to 5 days and as such due to nausea and vomiting patient started on Decadron 4 mg IV daily x3 days.   Oncology, Dr. Burr Medico is following.   7. Recent left leg vein DVT Continued on full dose Lovenox.   -No evidence of acute blood loss at this time  8. Upper abdominal cramps Likely secondary to metastatic pancreatic adenocarcinoma.    Continue to ensure regular bowel movements given concurrent analgesics Continue with analgesia as needed Continue with PPI as tolerated  9. Lupus Being followed in the outpatient setting by rheumatologist, Dr. Trudie Reed on weekly methotrexate injections, Plaquenil and diclofenac.  Recommend f/u with Rheumatology as outpatient when pt is ultimately discharged  10. Probable UTI Urinalysis with moderate leukocytes, nitrite negative, rare bacteria, 21-50 WBCs.  Urine cx remains pending Pt has  completed 3 days IV Rocephin and remains stable  11. Anxiety Resume scheduled IV Ativan due to nausea and emesis.  Once tolerating oral intake could likely transition to oral Ativan as needed.  Continue supportive care.   12. Hypertension Patient still with nausea and emesis.   Pt is continued on IV Lopressor while pt is still nauseated Once tolerating oral intake could transition back to home regimen oral antihypertensive medications.   13.  Constipation Patient with large bowel movement RN after soapsuds enema was given. Recently noted to have some diarrhea. Continue with imodium for diarrhea Recent abd xray without acute process or obstruction  14.  Anemia of chronic disease/chemotherapy-induced anemia Likely chemo induced.  Patient denies any overt bleeding.  Consistent with anemia of chronic disease.   -Hgb was down to 7.2 on 11/3, given 1 unit PRBC -Hgb this AM stable at 9.2  DVT prophylaxis: Lovenox Code Status: Full Family Communication: Pt in room  Status is: Inpatient  Remains inpatient appropriate because:IV treatments appropriate due to intensity of illness or inability to take PO   Dispo: The patient is from: Home              Anticipated d/c is to: Home              Anticipated d/c date is: 3 days              Patient currently is not medically stable to d/c.  Consultants:   Oncology  Palliative Care  Procedures:     Antimicrobials: Anti-infectives (From admission, onward)   Start     Dose/Rate Route Frequency Ordered Stop   11/24/19 2000  cefTRIAXone (ROCEPHIN) 1 g in sodium chloride 0.9 % 100 mL IVPB  Status:  Discontinued        1 g 200 mL/hr over 30 Minutes Intravenous Every 24 hours 11/24/19 1912 11/27/19 0759      Subjective: Reports continued n/v but overall improved from before  Objective: Vitals:   12/03/19 2108 12/04/19 0534 12/04/19 1354 12/04/19 1428  BP: (!) 157/84 (!) 142/83  (!) 157/91  Pulse: 82 93  81  Resp: 17 18  20     Temp: 97.7 F (36.5 C) 97.9 F (36.6 C)  98.6 F (37 C)  TempSrc: Oral Oral  Oral  SpO2: 100% 99%  100%  Weight:  80.4 kg 79.9 kg   Height:        Intake/Output Summary (Last 24 hours) at 12/04/2019 1622 Last data filed at 12/04/2019 0300 Gross per 24 hour  Intake 2177.59 ml  Output --  Net 2177.59 ml   Filed Weights   12/03/19 0556 12/04/19 0534 12/04/19 1354  Weight: 80.1 kg 80.4 kg 79.9 kg    Examination: General exam: Awake, laying in bed, in nad Respiratory system: Normal respiratory effort, no wheezing Cardiovascular system: regular rate, s1, s2 Gastrointestinal system: Soft, nondistended, positive BS Central nervous system: CN2-12 grossly intact, strength intact Extremities: Perfused, no clubbing Skin: Normal skin turgor, no notable skin lesions seen Psychiatry: Mood normal // no visual hallucinations   Data Reviewed: I have personally reviewed following labs and imaging studies  CBC: Recent Labs  Lab 11/30/19 0443 12/01/19 0639 12/02/19 0622 12/03/19 0514 12/04/19 0544  WBC 4.1 3.5* 3.6* 3.8* 3.7*  HGB 9.6* 9.3* 9.2* 9.3* 9.2*  HCT 28.8* 28.3* 27.8* 27.9* 28.5*  MCV 90.0 90.4 91.1 90.6 92.2  PLT 188 179 167 165 162   Basic  Metabolic Panel: Recent Labs  Lab 11/28/19 0601 11/29/19 0603 11/30/19 0443 12/01/19 0639 12/02/19 0622 12/03/19 0514 12/04/19 0544  NA 137   < > 135 137 138 137 136  K 3.0*   < > 3.0* 3.1* 3.1* 3.2* 2.9*  CL 105   < > 100 104 106 106 105  CO2 23   < > 26 26 26 26 25   GLUCOSE 74   < > 108* 116* 116* 110* 115*  BUN 7   < > <5* <5* <5* <5* <5*  CREATININE 0.57   < > 0.53 0.51 0.52 0.57 0.56  CALCIUM 8.4*   < > 8.7* 8.7* 8.9 8.8* 9.1  MG 2.6*  --  1.5* 1.9 1.8 2.0 1.8  PHOS 2.5  --   --   --   --   --   --    < > = values in this interval not displayed.   GFR: Estimated Creatinine Clearance: 83.4 mL/min (by C-G formula based on SCr of 0.56 mg/dL). Liver Function Tests: Recent Labs  Lab 11/30/19 0443 12/01/19 0639  12/02/19 0622 12/03/19 0514 12/04/19 0544  AST 32 53* 69* 59* 62*  ALT 20 28 41 44 50*  ALKPHOS 91 95 96 98 103  BILITOT 0.9 0.7 0.8 0.7 0.7  PROT 6.2* 6.2* 6.1* 6.2* 6.3*  ALBUMIN 2.9* 3.0* 2.9* 2.9* 3.0*   No results for input(s): LIPASE, AMYLASE in the last 168 hours. No results for input(s): AMMONIA in the last 168 hours. Coagulation Profile: No results for input(s): INR, PROTIME in the last 168 hours. Cardiac Enzymes: No results for input(s): CKTOTAL, CKMB, CKMBINDEX, TROPONINI in the last 168 hours. BNP (last 3 results) No results for input(s): PROBNP in the last 8760 hours. HbA1C: No results for input(s): HGBA1C in the last 72 hours. CBG: Recent Labs  Lab 11/30/19 0738 12/01/19 0630 12/02/19 0647 12/03/19 0743 12/04/19 0726  GLUCAP 117* 111* 107* 109* 103*   Lipid Profile: No results for input(s): CHOL, HDL, LDLCALC, TRIG, CHOLHDL, LDLDIRECT in the last 72 hours. Thyroid Function Tests: No results for input(s): TSH, T4TOTAL, FREET4, T3FREE, THYROIDAB in the last 72 hours. Anemia Panel: No results for input(s): VITAMINB12, FOLATE, FERRITIN, TIBC, IRON, RETICCTPCT in the last 72 hours. Sepsis Labs: No results for input(s): PROCALCITON, LATICACIDVEN in the last 168 hours.  No results found for this or any previous visit (from the past 240 hour(s)).   Radiology Studies: DG Abd 1 View  Result Date: 12/03/2019 CLINICAL DATA:  Diffuse abdominal pain EXAM: ABDOMEN - 1 VIEW COMPARISON:  11/24/2019 FINDINGS: The bowel gas pattern is normal. No radio-opaque calculi or other significant radiographic abnormality are seen. IMPRESSION: Negative. Electronically Signed   By: Inez Catalina M.D.   On: 12/03/2019 08:21    Scheduled Meds: . sodium chloride   Intravenous Once  . Chlorhexidine Gluconate Cloth  6 each Topical Daily  . enoxaparin  120 mg Subcutaneous Q24H  . fluticasone  2 spray Each Nare Daily  . lipase/protease/amylase  24,000 Units Oral TID WC  . metoprolol  tartrate  5 mg Intravenous Q8H  . OLANZapine  5 mg Oral QHS  . pantoprazole (PROTONIX) IV  40 mg Intravenous Q24H  . polyethylene glycol  17 g Oral Daily  . senna-docusate  1 tablet Oral BID  . sodium chloride flush  3 mL Intravenous Q12H  . topiramate  50 mg Oral QHS   Continuous Infusions: . dextrose 5 % and 0.9% NaCl 100 mL/hr at 12/04/19  0300     LOS: 9 days   Marylu Lund, MD [facility] Pager On Amion  If 7PM-7AM, please contact night-coverage 12/04/2019, 4:22 PM

## 2019-12-04 NOTE — Progress Notes (Signed)
Per paitent she has safely ate the chicken broth from the cafeteria in relation to her allergies.

## 2019-12-04 NOTE — Progress Notes (Signed)
Daily Progress Note   Patient Name: Cynthia Frye       Date: 12/04/2019 DOB: 1960-09-30  Age: 59 y.o. MRN#: [id number] Attending Physician: Donne Hazel, MD Primary Care Physician: Josetta Huddle, MD Admit Date: 11/24/2019  Reason for Consultation/Follow-up: Non pain symptom management  Subjective: I saw and examined Cynthia Frye today.  She was sitting in bed in no distress time of my encounter.    Length of Stay: 9  Current Medications: Scheduled Meds:  . sodium chloride   Intravenous Once  . Chlorhexidine Gluconate Cloth  6 each Topical Daily  . enoxaparin  120 mg Subcutaneous Q24H  . fluticasone  2 spray Each Nare Daily  . lipase/protease/amylase  24,000 Units Oral TID WC  . metoprolol tartrate  5 mg Intravenous Q8H  . OLANZapine  5 mg Oral QHS  . pantoprazole (PROTONIX) IV  40 mg Intravenous Q24H  . polyethylene glycol  17 g Oral Daily  . senna-docusate  1 tablet Oral BID  . sodium chloride flush  3 mL Intravenous Q12H  . topiramate  50 mg Oral QHS    Continuous Infusions: . dextrose 5 % and 0.9% NaCl 100 mL/hr at 12/04/19 0300  . potassium chloride 10 mEq (12/04/19 1302)    PRN Meds: acetaminophen **OR** acetaminophen, hydrALAZINE, HYDROmorphone (DILAUDID) injection, hydrOXYzine, ketorolac, levalbuterol, loperamide, LORazepam, metoCLOPramide (REGLAN) injection, ondansetron (ZOFRAN) IV, oxyCODONE, prochlorperazine  Physical Exam         General: Alert, awake, in no acute distress.  Heart: Regular rate and rhythm. No murmur appreciated. Lungs: Good air movement, clear Abdomen: Soft, nontender, nondistended, positive bowel sounds.  Ext: No significant edema Skin: Warm and dry Neuro: Grossly intact, nonfocal.   Vital Signs: BP (!) 142/83 (BP Location: Right Arm)    Pulse 93   Temp 97.9 F (36.6 C) (Oral)   Resp 18   Ht 5\' 7"  (1.702 m)   Wt 80.4 kg   SpO2 99%   BMI 27.76 kg/m  SpO2: SpO2: 99 % O2 Device: O2 Device: Room Air O2 Flow Rate:    Intake/output summary:   Intake/Output Summary (Last 24 hours) at 12/04/2019 1326 Last data filed at 12/04/2019 0300 Gross per 24 hour  Intake 2177.59 ml  Output 400 ml  Net 1777.59 ml   LBM: Last BM Date: 12/02/19 Baseline  Weight: Weight: 79.4 kg Most recent weight: Weight: 80.4 kg       Palliative Assessment/Data:      Patient Active Problem List   Diagnosis Date Noted  . Malnutrition of moderate degree 11/26/2019  . 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 Constipation 11/25/2019  . Generalized abdominal pain   . Nausea and vomiting in adult 11/24/2019  . Dehydration 11/24/2019  . Prolonged QT interval 11/24/2019  . Emesis   . Tachycardia   . Genetic testing 11/12/2019  . Colitis 11/04/2019  . Acute deep vein thrombosis of left iliac vein (HCC) 11/04/2019  . Hypokalemia 11/04/2019  . Port-A-Cath in place 10/24/2019  . Goals of care, counseling/discussion 10/18/2019  . Pancreatic cancer ([place]) 10/02/2019  . Chronic migraine without aura without status migrainosus, not intractable 04/23/2019  . Migraine with aura and without status migrainosus, not intractable 04/23/2019  . Essential hypertension 04/29/2016  . Lupus (systemic lupus erythematosus) ([place]) 04/29/2016  . Asthma 04/29/2016  . Seasonal allergies 04/29/2016  . Abnormal stress test 04/09/2016    Palliative Care Assessment & Plan   Patient Profile: Cynthia Frye is a 59 year old female with past medical history of recently diagnosed metastatic pancreatic cancer who recently underwent second round of chemotherapy and was admitted with worsening nausea, vomiting, and abdominal pain.  She has had significant African hypokalemia and hypomagnesemia.  CT of the abdomen on exam was negative for bowel obstruction.   Urinalysis was concerning for UTI.  Of note, she had several day admission following first round of chemo with colitis and also suffering from symptoms of nausea and vomiting that was uncontrolled.  Palliative consulted for refractory nausea and vomiting. Assessment: Patient Active Problem List   Diagnosis Date Noted  . Malnutrition of moderate degree 11/26/2019  . 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 Constipation 11/25/2019  . Generalized abdominal pain   . Nausea and vomiting in adult 11/24/2019  . Dehydration 11/24/2019  . Prolonged QT interval 11/24/2019  . Emesis   . Tachycardia   . Genetic testing 11/12/2019  . Colitis 11/04/2019  . Acute deep vein thrombosis of left iliac vein (HCC) 11/04/2019  . Hypokalemia 11/04/2019  . Port-A-Cath in place 10/24/2019  . Goals of care, counseling/discussion 10/18/2019  . Pancreatic cancer ([place]) 10/02/2019  . Chronic migraine without aura without status migrainosus, not intractable 04/23/2019  . Migraine with aura and without status migrainosus, not intractable 04/23/2019  . Essential hypertension 04/29/2016  . Lupus (systemic lupus erythematosus) ([place]) 04/29/2016  . Asthma 04/29/2016  . Seasonal allergies 04/29/2016  . Abnormal stress test 04/09/2016     Recommendations/Plan:  Nausea somewhat better today per patient report, recommend continuation of current antiemetic regimen.  Patient wishes to try some broth later today.  Is still on liquids.   Goals of Care and Additional Recommendations:  Limitations on Scope of Treatment: Full Scope Treatment  Code Status:    Code Status Orders  (From admission, onward)         Start     Ordered   11/24/19 1909  Full code  Continuous        11/24/19 1908        Code Status History    Date Active Date Inactive Code Status Order ID Comments User Context   11/04/2019 2125 11/09/2019 2207 Full Code [id number]  Rise Patience, MD ED   04/09/2016 1319 04/09/2016 1952  Full Code [id number]  Nelva Bush, MD Inpatient   Advance Care Planning Activity  Prognosis:   Unable to determine  Discharge Planning:  To Be Determined  Care plan was discussed with patient, interdisciplinary care team  Thank you for allowing the Palliative Medicine Team to assist in the care of this patient.   Time In: 1300 Time Out: 1325 Total Time 25 Prolonged Time Billed No      Greater than 50%  of this time was spent counseling and coordinating care related to the above assessment and plan.  Loistine Chance, MD  Please contact Palliative Medicine Team phone at [phone] for questions and concerns.

## 2019-12-04 NOTE — Progress Notes (Signed)
Nutrition Follow-up  DOCUMENTATION CODES:   Non-severe (moderate) malnutrition in context of chronic illness  INTERVENTION:   Monitor magnesium, potassium, and phosphorus daily for at least 3 days, MD to replete as needed, as pt is at risk for refeeding syndrome.  -Recommend initiation of TPN as pt has been unable to tolerate POs d/t uncontrollable N/V for 9-10 days now.  -D/c Ensure  NUTRITION DIAGNOSIS:   Moderate Malnutrition related to chronic illness, cancer and cancer related treatments as evidenced by percent weight loss, energy intake < or equal to 75% for > or equal to 1 month.  Ongoing.  GOAL:   Patient will meet greater than or equal to 90% of their needs  Not meeting.  MONITOR:   PO intake, Supplement acceptance, Labs, Weight trends, I & O's  ASSESSMENT:   59 year old unfortunate female history of recently diagnosed metastatic pancreatic adenocarcinoma with mets to the liver 09/19/2019, currently undergoing chemotherapy, recent chemotherapy 11/21/2019 presenting with 1 day history of worsening nausea, vomiting, abdominal pain.  Patient has continued to struggle to tolerate POs d/t N/V unable to be controlled.   Per oncology note, recommend trial of Creon but per documentation from nursing staff pt is unable to keep medications down PO so has not received a dose yet.  Oncology also mentioned starting TPN given difficulty of tolerating PO for so long now. Pt has been on clear liquids since 11/2.  Pt has an aversion to sweet supplements and was not drinking Ensure that was ordered so d/c. Hesitant to order any supplements at this time as don't feel pt will tolerate unless can tolerate her medications. Pt at risk of worsening malnutrition if PO does not improve or nutrition support is not initiated.  Admission weight: 178 lbs. Current weight: 177 lbs.  Medications: Creon, Miralax, Senokot, D5 infusion, IV Mg sulfate, IV KCl, IV Reglan, IV Zofran, IV  Compazine  Labs reviewed: CBGs: 103-109 Low K  Diet Order:   Diet Order            Diet clear liquid Room service appropriate? Yes; Fluid consistency: Thin  Diet effective now                 EDUCATION NEEDS:   No education needs have been identified at this time  Skin:  Skin Assessment: Reviewed RN Assessment  Last BM:  10/31 -type 3  Height:   Ht Readings from Last 1 Encounters:  11/24/19 5\' 7"  (1.702 m)    Weight:   Wt Readings from Last 1 Encounters:  12/04/19 80.4 kg   BMI:  Body mass index is 27.76 kg/m.  Estimated Nutritional Needs:   Kcal:  2200-2400  Protein:  115-125g  Fluid:  2L/day  Clayton Bibles, MS, RD, LDN Inpatient Clinical Dietitian Contact information available via Amion

## 2019-12-04 NOTE — Progress Notes (Signed)
Physical Therapy Treatment Patient Details Name: Cynthia Frye MRN: [id number] DOB: Dec 20, 1960 Today's Date: 12/04/2019    History of Present Illness 59 year old unfortunate female history of recently diagnosed metastatic pancreatic adenocarcinoma with mets to the liver 09/19/2019, currently undergoing chemotherapy, recent chemotherapy 11/21/2019 presenting with 1 day history of worsening nausea, vomiting, abdominal pain.  Noted to have significant hypokalemia, admitted to [facility]    PT Comments    Pt tolerated increased activity level today, she ambulated 120' with a RW, no loss of balance, HR 115, 2/4 dyspnea. Instructed pt in strengthening exercises to minimize deconditioning during hospitalization.   Follow Up Recommendations  No PT follow up     Equipment Recommendations  Rolling walker with 5" wheels    Recommendations for Other Services       Precautions / Restrictions Precautions Precautions: Fall Precaution Comments: CHEMO precautions Restrictions Weight Bearing Restrictions: No    Mobility  Bed Mobility Overal bed mobility: Modified Independent Bed Mobility: Sit to Supine       Sit to supine: Modified independent (Device/Increase time)   General bed mobility comments: HOB up, used rail  Transfers Overall transfer level: Needs assistance Equipment used: Rolling walker (2 wheeled) Transfers: Sit to/from Stand Sit to Stand: Min guard         General transfer comment: VCs for safe hand placement  Ambulation/Gait Ambulation/Gait assistance: Supervision Gait Distance (Feet): 120 Feet Assistive device: Rolling walker (2 wheeled) Gait Pattern/deviations: Step-through pattern;Decreased stride length Gait velocity: decreased   General Gait Details: steady with walking, HR 115 walking, 2/4 dyspnea, no loss of balance   Stairs             Wheelchair Mobility    Modified Rankin (Stroke Patients Only)       Balance Overall balance assessment: Needs  assistance Sitting-balance support: No upper extremity supported;Feet supported Sitting balance-Leahy Scale: Good     Standing balance support: No upper extremity supported;During functional activity Standing balance-Leahy Scale: Fair Standing balance comment: standing at sink for oral care                            Cognition Arousal/Alertness: Awake/alert Behavior During Therapy: WFL for tasks assessed/performed;Flat affect Overall Cognitive Status: Within Functional Limits for tasks assessed                                        Exercises General Exercises - Lower Extremity Ankle Circles/Pumps: AROM;Both;10 reps;Supine Quad Sets: AROM;Both;5 reps;Supine Gluteal Sets: AROM;Both;5 reps;Supine   Demonstrated LAQs, seated marching, and B shoulder flexion AROM.     General Comments        Pertinent Vitals/Pain Pain Assessment: No/denies pain    Home Living                      Prior Function            PT Goals (current goals can now be found in the care plan section) Acute Rehab PT Goals Patient Stated Goal: be able to exercise and socialize PT Goal Formulation: With patient Time For Goal Achievement: 12/09/19 Potential to Achieve Goals: Good Progress towards PT goals: Progressing toward goals    Frequency    Min 3X/week      PT Plan Current plan remains appropriate    Co-evaluation  AM-PAC PT "6 Clicks" Mobility   Outcome Measure  Help needed turning from your back to your side while in a flat bed without using bedrails?: None Help needed moving from lying on your back to sitting on the side of a flat bed without using bedrails?: A Little Help needed moving to and from a bed to a chair (including a wheelchair)?: None Help needed standing up from a chair using your arms (e.g., wheelchair or bedside chair)?: None Help needed to walk in hospital room?: None Help needed climbing 3-5 steps with a  railing? : A Little 6 Click Score: 22    End of Session Equipment Utilized During Treatment: Gait belt Activity Tolerance: Patient limited by fatigue;Patient tolerated treatment well Patient left: in bed;with call bell/phone within reach Nurse Communication: Mobility status PT Visit Diagnosis: Difficulty in walking, not elsewhere classified (R26.2)     Time: 6067-7034 PT Time Calculation (min) (ACUTE ONLY): 15 min  Charges:  $Gait Training: 8-22 mins                     Blondell Reveal Kistler PT 12/04/2019  Acute Rehabilitation Services Pager [phone] Office [phone]

## 2019-12-04 NOTE — Progress Notes (Addendum)
Brief Pharmacy note: initiation of TPN 11/10  Intractable nausea post-chemo, poor po intake, requiring aggressive electrolyte repletion.   TPN labs ordered, Dietitian recommended TPN, ordered by Oncology.   Peanut allergy noted: reaction Hives Note that our usual Lipid product may have cross-reactivity in persons with Peanut allergies.   Estimated Nutritional Needs:   Kcal:  2200-2400 Protein:  115-125g Fluid:  2L/day   Central access via PAC  Minda Ditto PharmD [facility] [phone] 12/04/2019, 1:59 PM

## 2019-12-05 ENCOUNTER — Other Ambulatory Visit: Payer: Self-pay | Admitting: Hematology

## 2019-12-05 DIAGNOSIS — R112 Nausea with vomiting, unspecified: Secondary | ICD-10-CM | POA: Diagnosis not present

## 2019-12-05 DIAGNOSIS — E876 Hypokalemia: Secondary | ICD-10-CM | POA: Diagnosis not present

## 2019-12-05 LAB — COMPREHENSIVE METABOLIC PANEL
ALT: 49 U/L — ABNORMAL HIGH (ref 0–44)
AST: 56 U/L — ABNORMAL HIGH (ref 15–41)
Albumin: 3 g/dL — ABNORMAL LOW (ref 3.5–5.0)
Alkaline Phosphatase: 107 U/L (ref 38–126)
Anion gap: 7 (ref 5–15)
BUN: <5 mg/dL — ABNORMAL LOW (ref 6–20)
CO2: 25 mmol/L (ref 22–32)
Calcium: 9 mg/dL (ref 8.9–10.3)
Chloride: 105 mmol/L (ref 98–111)
Creatinine, Ser: 0.51 mg/dL (ref 0.44–1.00)
GFR, Estimated: >60 mL/min (ref >60)
Glucose, Bld: 105 mg/dL — ABNORMAL HIGH (ref 70–99)
Potassium: 3.2 mmol/L — ABNORMAL LOW (ref 3.5–5.1)
Sodium: 137 mmol/L (ref 135–145)
Total Bilirubin: 0.6 mg/dL (ref 0.3–1.2)
Total Protein: 6.4 g/dL — ABNORMAL LOW (ref 6.5–8.1)

## 2019-12-05 LAB — CBC
HCT: 27.6 % — ABNORMAL LOW (ref 36.0–46.0)
Hemoglobin: 9.1 g/dL — ABNORMAL LOW (ref 12.0–15.0)
MCH: 30.5 pg (ref 26.0–34.0)
MCHC: 33 g/dL (ref 30.0–36.0)
MCV: 92.6 fL (ref 80.0–100.0)
Platelets: 169 10*3/uL (ref 150–400)
RBC: 2.98 MIL/uL — ABNORMAL LOW (ref 3.87–5.11)
RDW: 15.2 % (ref 11.5–15.5)
WBC: 3.2 10*3/uL — ABNORMAL LOW (ref 4.0–10.5)
nRBC: 0 % (ref 0.0–0.2)

## 2019-12-05 LAB — GLUCOSE, CAPILLARY
Glucose-Capillary: 106 mg/dL — ABNORMAL HIGH (ref 70–99)
Glucose-Capillary: 109 mg/dL — ABNORMAL HIGH (ref 70–99)
Glucose-Capillary: 112 mg/dL — ABNORMAL HIGH (ref 70–99)
Glucose-Capillary: 124 mg/dL — ABNORMAL HIGH (ref 70–99)

## 2019-12-05 LAB — TRIGLYCERIDES: Triglycerides: 64 mg/dL (ref <150)

## 2019-12-05 LAB — DIFFERENTIAL
Abs Immature Granulocytes: 0 10*3/uL (ref 0.00–0.07)
Basophils Absolute: 0 10*3/uL (ref 0.0–0.1)
Basophils Relative: 1 %
Eosinophils Absolute: 0.1 10*3/uL (ref 0.0–0.5)
Eosinophils Relative: 3 %
Immature Granulocytes: 0 %
Lymphocytes Relative: 28 %
Lymphs Abs: 0.9 10*3/uL (ref 0.7–4.0)
Monocytes Absolute: 0.5 10*3/uL (ref 0.1–1.0)
Monocytes Relative: 14 %
Neutro Abs: 1.7 10*3/uL (ref 1.7–7.7)
Neutrophils Relative %: 54 %

## 2019-12-05 LAB — PHOSPHORUS: Phosphorus: 3.4 mg/dL (ref 2.5–4.6)

## 2019-12-05 LAB — MAGNESIUM: Magnesium: 2 mg/dL (ref 1.7–2.4)

## 2019-12-05 LAB — PREALBUMIN: Prealbumin: 9 mg/dL — ABNORMAL LOW (ref 18–38)

## 2019-12-05 MED ORDER — POTASSIUM CHLORIDE 10 MEQ/100ML IV SOLN
10.0000 meq | INTRAVENOUS | Status: AC
  Administered 2019-12-05 (×5): 10 meq via INTRAVENOUS
  Filled 2019-12-05 (×2): qty 100

## 2019-12-05 MED ORDER — DEXTROSE-NACL 5-0.9 % IV SOLN: INTRAVENOUS | Status: AC

## 2019-12-05 MED ORDER — ONDANSETRON HCL 4 MG/2ML IJ SOLN
4.0000 mg | Freq: Four times a day (QID) | INTRAMUSCULAR | Status: DC | PRN
  Administered 2019-12-05 – 2019-12-11 (×15): 4 mg via INTRAVENOUS
  Filled 2019-12-05 (×15): qty 2

## 2019-12-05 MED ORDER — SODIUM CHLORIDE 0.9% FLUSH
10.0000 mL | Freq: Two times a day (BID) | INTRAVENOUS | Status: DC
  Administered 2019-12-07: 10 mL

## 2019-12-05 MED ORDER — METOCLOPRAMIDE HCL 5 MG/ML IJ SOLN
5.0000 mg | Freq: Three times a day (TID) | INTRAMUSCULAR | Status: DC | PRN
  Administered 2019-12-07 – 2019-12-10 (×4): 5 mg via INTRAVENOUS
  Filled 2019-12-05 (×4): qty 2

## 2019-12-05 MED ORDER — INSULIN ASPART 100 UNIT/ML ~~LOC~~ SOLN
0.0000 [IU] | Freq: Four times a day (QID) | SUBCUTANEOUS | Status: DC
  Administered 2019-12-05 – 2019-12-11 (×4): 1 [IU] via SUBCUTANEOUS

## 2019-12-05 MED ORDER — PANTOPRAZOLE SODIUM 40 MG PO TBEC
40.0000 mg | DELAYED_RELEASE_TABLET | Freq: Every day | ORAL | Status: DC
  Administered 2019-12-07 – 2019-12-12 (×6): 40 mg via ORAL
  Filled 2019-12-05 (×6): qty 1

## 2019-12-05 MED ORDER — OLANZAPINE 5 MG PO TABS
7.5000 mg | ORAL_TABLET | Freq: Every day | ORAL | Status: DC
  Administered 2019-12-05 – 2019-12-11 (×7): 7.5 mg via ORAL
  Filled 2019-12-05 (×7): qty 2

## 2019-12-05 MED ORDER — PROCHLORPERAZINE EDISYLATE 10 MG/2ML IJ SOLN
10.0000 mg | Freq: Four times a day (QID) | INTRAMUSCULAR | Status: DC | PRN
  Administered 2019-12-05 – 2019-12-08 (×5): 10 mg via INTRAVENOUS
  Filled 2019-12-05 (×5): qty 2

## 2019-12-05 MED ORDER — TRAVASOL 10 % IV SOLN
INTRAVENOUS | Status: DC
  Filled 2019-12-05: qty 528

## 2019-12-05 MED ORDER — SODIUM CHLORIDE 0.9% FLUSH: 10.0000 mL | INTRAVENOUS | Status: DC | PRN

## 2019-12-05 NOTE — Progress Notes (Signed)
Occupational Therapy Treatment Patient Details Name: Cynthia Frye MRN: [id number] DOB: [birth year] Today's Date: 12/05/2019    History of present illness 59 year old unfortunate female history of recently diagnosed metastatic pancreatic adenocarcinoma with mets to the liver 09/19/2019, currently undergoing chemotherapy, recent chemotherapy 11/21/2019 presenting with 1 day history of worsening nausea, vomiting, abdominal pain.  Noted to have significant hypokalemia, admitted to [facility]   OT comments  Treatment focused on movement to improve activity tolerance. Patient able to ambulate in room pushing IV pole with supervision only. Patient and therapist discussed need of activity/movement to maintain strength while in hospital and on return home. Reiterated use of shower chair for home. Patient able to perform 2 min 25 sec of activity before requiring seated rest break. Patient will benefit from UB HEP.     Follow Up Recommendations  No OT follow up;Supervision - Intermittent    Equipment Recommendations  Tub/shower seat    Recommendations for Other Services      Precautions / Restrictions Precautions Precautions: None Precaution Comments: CHEMO precautions Restrictions Weight Bearing Restrictions: No       Mobility Bed Mobility Overal bed mobility: Modified Independent Bed Mobility: Sit to Supine;Supine to Sit     Supine to sit: Modified independent (Device/Increase time) Sit to supine: Modified independent (Device/Increase time)      Transfers Overall transfer level: Needs assistance Equipment used: Rolling walker (2 wheeled) Transfers: Sit to/from Stand Sit to Stand: Supervision Stand pivot transfers: Supervision       General transfer comment: supervision to ambulate in room pushing IV pole.    Balance Overall balance assessment: Mild deficits observed, not formally tested                                         ADL either performed or assessed  with clinical judgement   ADL                         Lower Body Dressing Details (indicate cue type and reason): demonstrated ability to reach feet to don socks.               General ADL Comments: Reports ambulating to bathroom and performing toiletin with supervision from nursing. Reiterated use of shower chair for safety at home. Patient had no deficits or concerns to report for return home.     Vision Patient Visual Report: No change from baseline     Perception     Praxis      Cognition Arousal/Alertness: Awake/alert Behavior During Therapy: WFL for tasks assessed/performed Overall Cognitive Status: Within Functional Limits for tasks assessed                                          Exercises Other Exercises Other Exercises: Timed ambulation in room to determine activity tolerance. Able to ambulate 2 min 25 seconds before needing rest break   Shoulder Instructions       General Comments      Pertinent Vitals/ Pain       Pain Assessment: No/denies pain  Home Living  Prior Functioning/Environment              Frequency  Min 2X/week        Progress Toward Goals  OT Goals(current goals can now be found in the care plan section)  Progress towards OT goals: Progressing toward goals  Acute Rehab OT Goals Patient Stated Goal: be able to exercise and socialize OT Goal Formulation: With patient Time For Goal Achievement: 12/09/19  Plan Discharge plan remains appropriate    Co-evaluation                 AM-PAC OT "6 Clicks" Daily Activity     Outcome Measure   Help from another person eating meals?: None Help from another person taking care of personal grooming?: None Help from another person toileting, which includes using toliet, bedpan, or urinal?: None Help from another person bathing (including washing, rinsing, drying)?: None Help from another  person to put on and taking off regular upper body clothing?: None Help from another person to put on and taking off regular lower body clothing?: None 6 Click Score: 24    End of Session    OT Visit Diagnosis: Muscle weakness (generalized) (M62.81)   Activity Tolerance Patient tolerated treatment well   Patient Left in bed;with call bell/phone within reach   Nurse Communication Mobility status        Time: 8333-8329 OT Time Calculation (min): 14 min  Charges: OT General Charges $OT Visit: 1 Visit OT Treatments $Therapeutic Activity: 8-22 mins  Derl Barrow, OTR/L [place]  Office [phone] Pager: [place] 12/05/2019, 1:05 PM

## 2019-12-05 NOTE — Progress Notes (Signed)
PROGRESS NOTE    Cynthia Frye  QQI:[id number] DOB: 01-22-1961 DOA: 11/24/2019 PCP: Josetta Huddle, MD    Chief Complaint  Patient presents with  . Emesis    Brief Narrative:   59 year old unfortunate female history of recently diagnosed metastatic pancreatic adenocarcinoma with mets to the liver 09/19/2019, currently undergoing chemotherapy, recent chemotherapy 11/21/2019 presenting with 1 day history of worsening nausea, vomiting, abdominal pain. Noted to have significant hypokalemia with inability to keep anything down. CT abdomen and pelvis negative for small bowel obstruction. Urinalysis concerning for UTI. Patient started on IV antibiotics, scheduled IV antiemetics, IV fluids, potassium repletion  Subjective:   Continue to have n/v  Assessment & Plan:   Principal Problem:   Hypokalemia Active Problems:   Essential hypertension   Lupus (systemic lupus erythematosus) (HCC)   Seasonal allergies   Chronic migraine without aura without status migrainosus, not intractable   Pancreatic cancer (HCC)   Acute deep vein thrombosis of left iliac vein (HCC)   Nausea and vomiting in adult   Dehydration   Prolonged QT interval   Emesis   Tachycardia   Anemia associated with chemotherapy   Constipation   Generalized abdominal pain   Malnutrition of moderate degree  Intractable nausea vomiting, dehydration On around-the-clock as needed Zofran, Compazine TPN started today due to poor oral intake We will add on PPI Case discussed with oncology, will consult GI  Hypokalemia hypomagnesemia Replace to keep K above 4, mag above 2  QTC prolongation Resolved, monitor QTC every morning  Possible UTI -Completed 3 days of IV Rocephin  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 Burr Medico  following  Normocytic anemia in the setting of malignancy Hemoglobin stable above 9  Recent left leg DVT Currently on full dose Lovenox  History of lupus Follows rheumatologist Dr. Lenna Gilford, on weekly methotrexate injection Plaquenil diclofenac  Hypertension On IV Lopressor due to inconsistent oral intake  DVT prophylaxis: Full dose Lovenox   Code Status: Full Family Communication: Patient Disposition:   Status is: Inpatient  Dispo: The patient is from: Home              Anticipated d/c is to: Home              Anticipated d/c date is: To be determined                Consultants:   Oncology Dr Burr Medico  GI  Procedures:   None  Antimicrobials:   As above     Objective: Vitals:   12/04/19 1428 12/04/19 2046 12/05/19 0540 12/05/19 0823  BP: (!) 157/91 (!) 152/98 (!) 151/93 (!) 160/87  Pulse: 81 90 87 72  Resp: 20 17 15 17   Temp: 98.6 F (37 C) 98.1 F (36.7 C) 97.8 F (36.6 C) 98.3 F (36.8 C)  TempSrc: Oral Oral Oral Oral  SpO2: 100% 99% 100% 100%  Weight:   80.2 kg   Height:        Intake/Output Summary (Last 24 hours) at 12/05/2019 1435 Last data filed at 12/05/2019 0400 Gross per 24 hour  Intake 2457.2 ml  Output --  Net 2457.2 ml   Filed Weights   12/04/19 0534 12/04/19 1354 12/05/19 0540  Weight: 80.4 kg 79.9 kg 80.2 kg    Examination:  General exam: calm, NAD Respiratory system: Clear to auscultation. Respiratory  effort normal. Cardiovascular system: S1 & S2 heard, RRR. No JVD, no murmur, No pedal edema. Gastrointestinal system: Abdomen is nondistended, soft and nontender.  Normal bowel sounds heard. Central nervous system: Alert and oriented. No focal neurological deficits. Extremities: Symmetric 5 x 5 power. Skin: No rashes, lesions or ulcers Psychiatry: Judgement and insight appear normal. Mood & affect appropriate.     Data Reviewed: I have personally reviewed following labs and imaging studies  CBC: Recent Labs  Lab  12/01/19 0639 12/02/19 0622 12/03/19 0514 12/04/19 0544 12/05/19 0615  WBC 3.5* 3.6* 3.8* 3.7* 3.2*  NEUTROABS  --   --   --   --  1.7  HGB 9.3* 9.2* 9.3* 9.2* 9.1*  HCT 28.3* 27.8* 27.9* 28.5* 27.6*  MCV 90.4 91.1 90.6 92.2 92.6  PLT 179 167 165 162 875    Basic Metabolic Panel: Recent Labs  Lab 12/01/19 0639 12/02/19 0622 12/03/19 0514 12/04/19 0544 12/05/19 0615  NA 137 138 137 136 137  K 3.1* 3.1* 3.2* 2.9* 3.2*  CL 104 106 106 105 105  CO2 26 26 26 25 25   GLUCOSE 116* 116* 110* 115* 105*  BUN <5* <5* <5* <5* <5*  CREATININE 0.51 0.52 0.57 0.56 0.51  CALCIUM 8.7* 8.9 8.8* 9.1 9.0  MG 1.9 1.8 2.0 1.8 2.0  PHOS  --   --   --   --  3.4    GFR: Estimated Creatinine Clearance: 83.5 mL/min (by C-G formula based on SCr of 0.51 mg/dL).  Liver Function Tests: Recent Labs  Lab 12/01/19 0639 12/02/19 0622 12/03/19 0514 12/04/19 0544 12/05/19 0615  AST 53* 69* 59* 62* 56*  ALT 28 41 44 50* 49*  ALKPHOS 95 96 98 103 107  BILITOT 0.7 0.8 0.7 0.7 0.6  PROT 6.2* 6.1* 6.2* 6.3* 6.4*  ALBUMIN 3.0* 2.9* 2.9* 3.0* 3.0*    CBG: Recent Labs  Lab 12/02/19 0647 12/03/19 0743 12/04/19 0726 12/05/19 0753 12/05/19 1157  GLUCAP 107* 109* 103* 106* 112*     No results found for this or any previous visit (from the past 240 hour(s)).       Radiology Studies: No results found.      Scheduled Meds: . Chlorhexidine Gluconate Cloth  6 each Topical Daily  . enoxaparin  120 mg Subcutaneous Q24H  . fluticasone  2 spray Each Nare Daily  . insulin aspart  0-9 Units Subcutaneous Q6H  . lipase/protease/amylase  24,000 Units Oral TID WC  . metoprolol tartrate  5 mg Intravenous Q8H  . OLANZapine  5 mg Oral QHS  . [START ON 12/06/2019] pantoprazole  40 mg Oral Q1200  . polyethylene glycol  17 g Oral Daily  . senna-docusate  1 tablet Oral BID  . sodium chloride flush  3 mL Intravenous Q12H  . topiramate  50 mg Oral QHS   Continuous Infusions: . dextrose 5 % and  0.9% NaCl 100 mL/hr at 12/05/19 0400  . dextrose 5 % and 0.9% NaCl    . TPN ADULT (ION)       LOS: 10 days   Time spent: 65mins Greater than 50% of this time was spent in counseling, explanation of diagnosis, planning of further management, and coordination of care.  I have personally reviewed and interpreted on  12/05/2019 daily labs, tele strips, I reviewed all nursing notes, pharmacy notes, consultant notes,  vitals, pertinent old records  I have discussed plan of care as described above with RN , patient  on 12/05/2019  Voice Recognition Viviann Spare dictation system was used to create this note, attempts have been made to correct errors. Please contact the author with questions and/or clarifications.   Florencia Reasons, MD PhD FACP [facility]  Available via Epic secure chat 7am-7pm for nonurgent issues Please page for urgent issues To page the attending provider between 7A-7P or the covering provider during after hours 7P-7A, please log into the web site [email or web address] and access using universal [place] password for that web site. If you do not have the password, please call the hospital operator.    12/05/2019, 2:35 PM

## 2019-12-05 NOTE — Progress Notes (Signed)
Daily Progress Note   Patient Name: Cynthia Frye       Date: 12/05/2019 DOB: Mar 07, 1960  Age: 59 y.o. MRN#: [id number] Attending Physician: Florencia Reasons, MD Primary Care Physician: Josetta Huddle, MD Admit Date: 11/24/2019  Reason for Consultation/Follow-up: Non pain symptom management  Subjective: I saw and examined Cynthia Frye today.  She was sitting in bed, she has had 4 vomiting episodes today, her husband also arrived in the room. She is trying to watch TV, she is trying but is not able to tolerate broth or liquids.    Length of Stay: 10  Current Medications: Scheduled Meds:  . Chlorhexidine Gluconate Cloth  6 each Topical Daily  . enoxaparin  120 mg Subcutaneous Q24H  . fluticasone  2 spray Each Nare Daily  . insulin aspart  0-9 Units Subcutaneous Q6H  . lipase/protease/amylase  24,000 Units Oral TID WC  . metoprolol tartrate  5 mg Intravenous Q8H  . OLANZapine  7.5 mg Oral QHS  . [START ON 12/06/2019] pantoprazole  40 mg Oral Q1200  . polyethylene glycol  17 g Oral Daily  . senna-docusate  1 tablet Oral BID  . sodium chloride flush  3 mL Intravenous Q12H  . topiramate  50 mg Oral QHS    Continuous Infusions: . dextrose 5 % and 0.9% NaCl 100 mL/hr at 12/05/19 0400  . dextrose 5 % and 0.9% NaCl    . TPN ADULT (ION)      PRN Meds: acetaminophen **OR** acetaminophen, hydrALAZINE, HYDROmorphone (DILAUDID) injection, hydrOXYzine, levalbuterol, loperamide, LORazepam, metoCLOPramide (REGLAN) injection, ondansetron (ZOFRAN) IV, oxyCODONE, prochlorperazine  Physical Exam         General: Alert, awake, in no acute distress.  Heart: Regular rate and rhythm. No murmur appreciated. Lungs: Good air movement, clear Abdomen: Soft, nontender, nondistended, positive bowel sounds.   Ext: No significant edema Skin: Warm and dry Neuro: Grossly intact, nonfocal.   Vital Signs: BP (!) 165/91 (BP Location: Right Arm)   Pulse 77   Temp 98.4 F (36.9 C) (Oral)   Resp 16   Ht 5\' 7"  (1.702 m)   Wt 80.2 kg   SpO2 100%   BMI 27.69 kg/m  SpO2: SpO2: 100 % O2 Device: O2 Device: Room Air O2 Flow Rate:    Intake/output summary:   Intake/Output Summary (Last 24  hours) at 12/05/2019 1637 Last data filed at 12/05/2019 0400 Gross per 24 hour  Intake 2457.2 ml  Output --  Net 2457.2 ml   LBM: Last BM Date: 12/05/19 Baseline Weight: Weight: 79.4 kg Most recent weight: Weight: 80.2 kg       Palliative Assessment/Data:      Patient Active Problem List   Diagnosis Date Noted  . Malnutrition of moderate degree 11/26/2019  . 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 Constipation 11/25/2019  . Generalized abdominal pain   . Nausea and vomiting in adult 11/24/2019  . Dehydration 11/24/2019  . Prolonged QT interval 11/24/2019  . Emesis   . Tachycardia   . Genetic testing 11/12/2019  . Colitis 11/04/2019  . Acute deep vein thrombosis of left iliac vein (HCC) 11/04/2019  . Hypokalemia 11/04/2019  . Port-A-Cath in place 10/24/2019  . Goals of care, counseling/discussion 10/18/2019  . Pancreatic cancer ([place]) 10/02/2019  . Chronic migraine without aura without status migrainosus, not intractable 04/23/2019  . Migraine with aura and without status migrainosus, not intractable 04/23/2019  . Essential hypertension 04/29/2016  . Lupus (systemic lupus erythematosus) ([place]) 04/29/2016  . Asthma 04/29/2016  . Seasonal allergies 04/29/2016  . Abnormal stress test 04/09/2016    Palliative Care Assessment & Plan   Patient Profile: Cynthia Frye is a 58 year old female with past medical history of recently diagnosed metastatic pancreatic cancer who recently underwent second round of chemotherapy and was admitted with worsening nausea, vomiting, and abdominal pain.   She has had significant African hypokalemia and hypomagnesemia.  CT of the abdomen on exam was negative for bowel obstruction.  Urinalysis was concerning for UTI.  Of note, she had several day admission following first round of chemo with colitis and also suffering from symptoms of nausea and vomiting that was uncontrolled.  Palliative consulted for refractory nausea and vomiting. Assessment: Patient Active Problem List   Diagnosis Date Noted  . Malnutrition of moderate degree 11/26/2019  . 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 Constipation 11/25/2019  . Generalized abdominal pain   . Nausea and vomiting in adult 11/24/2019  . Dehydration 11/24/2019  . Prolonged QT interval 11/24/2019  . Emesis   . Tachycardia   . Genetic testing 11/12/2019  . Colitis 11/04/2019  . Acute deep vein thrombosis of left iliac vein (HCC) 11/04/2019  . Hypokalemia 11/04/2019  . Port-A-Cath in place 10/24/2019  . Goals of care, counseling/discussion 10/18/2019  . Pancreatic cancer ([place]) 10/02/2019  . Chronic migraine without aura without status migrainosus, not intractable 04/23/2019  . Migraine with aura and without status migrainosus, not intractable 04/23/2019  . Essential hypertension 04/29/2016  . Lupus (systemic lupus erythematosus) ([place]) 04/29/2016  . Asthma 04/29/2016  . Seasonal allergies 04/29/2016  . Abnormal stress test 04/09/2016     Recommendations/Plan: Nausea vomiting ongoing, will increase dose of Zyprexa today, also on Zofran and Compazine, discussed with TRH MD, EKG for QT interval to also be monitored, to be started on TPN, continue current mode of care.    Goals of Care and Additional Recommendations:  Limitations on Scope of Treatment: Full Scope Treatment  Code Status:    Code Status Orders  (From admission, onward)         Start     Ordered   11/24/19 1909  Full code  Continuous        11/24/19 1908        Code Status History    Date Active Date  Inactive Code Status Order  ID Comments User Context   11/04/2019 2125 11/09/2019 2207 Full Code [id number]  Rise Patience, MD ED   04/09/2016 1319 04/09/2016 1952 Full Code [id number]  Nelva Bush, MD Inpatient   Advance Care Planning Activity       Prognosis:   Unable to determine  Discharge Planning:  To Be Determined  Care plan was discussed with patient, husband and interdisciplinary care team  Thank you for allowing the Palliative Medicine Team to assist in the care of this patient.   Time In: 1600 Time Out: 1625 Total Time 25 Prolonged Time Billed No      Greater than 50%  of this time was spent counseling and coordinating care related to the above assessment and plan.  Loistine Chance, MD  Please contact Palliative Medicine Team phone at [phone] for questions and concerns.

## 2019-12-05 NOTE — Progress Notes (Signed)
PHARMACY - TOTAL PARENTERAL NUTRITION CONSULT NOTE   Indication: intolerance to enteral feeding  Patient Measurements: Height: 5\' 7"  (170.2 cm) Weight: 80.2 kg (176 lb 12.9 oz) IBW/kg (Calculated) : 61.6 TPN AdjBW (KG): 66 Body mass index is 27.69 kg/m. Usual Weight:   Assessment: Patient is a 59 y.o. F with metastatic pancreatic cancer on chemotherapy PTA presented to the ED on 10/30 with c/o abdominal pain and n/v. With patient unable to tolerate oral intake, pharmacy is consulted to start TPN for nutrition.  Glucose / Insulin: no hx DM - CBG 105 from CMET Electrolytes: K 3.2 (got 6 runs KCL on 11/9); other lytes wnl Renal: scr 0.51, BUN <5 LFTs / TGs: AST/ALT down 56/49, Tbili wnl Prealbumin / albumin: prealbumin 9, albumin 3 Intake / Output; MIVF: I/O +2457 - D5NS at 100 ml/hr GI Imaging: Surgeries / Procedures:   Central access: port-a cath TPN start date: 12/05/19  Nutritional Goals (per RD recommendation on 11/9): Kcal:  2200-2400 Protein:  115-125g Fluid:  2L/day  Goal TPN rate is 90 mL/hr (provides 119 g of protein and 2372 kcals per day)  Current Nutrition:  Clear liquids  - start TPN on 11/10  - Of note, patient has peanut allergy (rxns= hives). Will use SMOFlipid  Plan:   Now: - potassium chloride 10 meq IV x 5 runs  - Start TPN at 40 mL/hr at 1800 - Electrolytes in TPN: 9mEq/L of Na, 56mEq/L of K, 54mEq/L of Ca, 55mEq/L of Mg, and 69mmol/L of Phos. Cl:Ac ratio 1:1 Add standard MVI and trace elements to TPN - Initiate Sensitive q6h SSI and adjust as needed  - Reduce MIVF to 60 mL/hr at 1800 - BMET, phos and mag on 12/06/19 - Monitor TPN labs on Mon/Thurs  Cynthia Frye P 12/05/2019,7:18 AM

## 2019-12-05 NOTE — Progress Notes (Signed)
HEMATOLOGY-ONCOLOGY PROGRESS NOTE  SUBJECTIVE: Pt overall stable, but still has intermittent N/V, she is not able to keep oral medicine down most of time, and is on IV zofran, compazine and reglan alternatively, every 2-4 hours. She is still trying clear liquid, but not able to keep much down, she is scheduled to start TPN later today   Oncology History Overview Note  Cancer Staging Pancreatic cancer [facility]) Staging form: Exocrine Pancreas, AJCC 8th Edition - Clinical stage from 10/18/2019: Stage IV (cT3, cN1, cM1) - Signed by Truitt Merle, MD on 10/18/2019    Pancreatic cancer ([place])  09/19/2019 Imaging   CT AP w contrast 09/19/19  IMPRESSION: 1. Findings are highly concerning for probable primary pancreatic adenocarcinoma in the anterior aspect of the pancreatic head. Several prominent borderline enlarged lymph nodes are noted in the hepatoduodenal nodal station, and there are multiple indeterminate liver lesions which are highly concerning for probable hepatic metastases. Further evaluation with nonemergent abdominal MRI with and without IV gadolinium with MRCP is recommended in the near future to better evaluate these findings.   09/25/2019 Procedure   Upper EUS by Dr Paulita Fujita  IMPRESSION -There was no evidence of significant pathology in the left lobe of the liver.  -A few lymph nodes were visualized and measures in the peripancreatic region and porta hepata region.  -Hyperchoic material consistent with sludge was visualized endosonographically in the gallbladder.  -There was no sign of significant pathology in the common bile duct.  -A mass was identified in the pancreatic head. If biopsy results show adenocarcinoma, it would be staged T3N1Mx by endosonographic criteria. Fine needle aspiration performed.    09/25/2019 Initial Biopsy   FINAL MICROSCOPIC DIAGNOSIS: Fine needle aspirate, Pancreas;  MALIGNANT CELLS PRESENT CONSISTENT WITH ADENOCARCINOMA.    10/02/2019 Initial Diagnosis    Pancreatic cancer ([place])   10/11/2019 Procedure   PAC placement y Dr Barry Dienes    10/15/2019 Imaging   CT Chest  IMPRESSION: 1. Small anterior left pneumothorax with dependent atelectasis in the left lower lobe. 2. Increased number of bilateral axillary and subpectoral lymph nodes with mild lymphadenopathy in the left axilla. While this would be an atypical presentation for metastatic pancreatic cancer, this possibility is not excluded 3. Main duct dilatation in the pancreas, better assessed on abdomen CT 09/19/2019.   10/16/2019 Imaging   MRI Abdomen  IMPRESSION: 1. Substantially motion degraded scan. 2. Probable persistent small anterior left lung base pneumothorax, better seen on chest CT from 1 day prior. 3. Poorly marginated hypoenhancing 3.7 x 2.9 cm pancreatic head mass, which appears to invade the anterior peripancreatic fat, compatible with known pancreatic adenocarcinoma. Diffuse irregular dilatation of the main pancreatic duct in the pancreatic body and tail. Mild narrowing of the main portal vein by the mass. Abdominal vasculature remains patent and otherwise uninvolved. 4. Numerous (greater than 10) small liver masses scattered throughout the liver, largest 1.0 cm, which appear to demonstrate targetoid enhancement on the limited motion degraded postcontrast sequences, compatible with liver metastases. 5. Mild porta hepatis adenopathy, suspicious for metastatic disease.   10/18/2019 Cancer Staging   Staging form: Exocrine Pancreas, AJCC 8th Edition - Clinical stage from 10/18/2019: Stage IV (cT3, cN1, cM1) - Signed by Truitt Merle, MD on 10/18/2019   10/24/2019 -  Chemotherapy   First-line FOLFIRINOX q2weeks starting 10/24/19   10/30/2019 Pathology Results   FINAL MICROSCOPIC DIAGNOSIS:   A. LIVER, LESION, BIOPSY:  -  Metastatic carcinoma  -  See comment   COMMENT:   By immunohistochemistry,  the neoplastic cells are positive for  cytokeratin 7 and GATA3 with patchy  nonspecific staining for PAX 8 but  negative for TTF-1, CDX2 and cytokeratin 20.  Overall, the findings are  consistent with metastasis of the patient's known breast carcinoma.  Prognostic panel (ER, PR, Her-2) is pending and will be reported in an  addendum.    ADDENDUM:   Dr. Laurence Ferrari notified us (November 01, 2019) that the patient was also  being worked up for a pancreatic mass.  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  In addition, ER and PR  are negative.  Gata-3 can be expressed in the pancreatic  adenocarcinomas; therefore, pancreatobiliary primary remains in the  differential.    11/02/2019 Genetic Testing   Negative genetic testing: no pathogenic variants detected in [facility] Common Hereditary Cancers Panel.  The report date is November 02, 2019.   The Common Hereditary Cancers Panel offered by [facility] includes sequencing and/or deletion duplication testing of the following 48 genes: APC, ATM, AXIN2, BARD1, BMPR1A, BRCA1, BRCA2, BRIP1, CDH1, CDK4, CDKN2A (p14ARF), CDKN2A (p16INK4a), CHEK2, CTNNA1, DICER1, EPCAM (Deletion/duplication testing only), GREM1 (promoter region deletion/duplication testing only), KIT, MEN1, MLH1, MSH2, MSH3, MSH6, MUTYH, NBN, NF1, NHTL1, PALB2, PDGFRA, PMS2, POLD1, POLE, PTEN, RAD50, RAD51C, RAD51D, RNF43, SDHB, SDHC, SDHD, SMAD4, SMARCA4. STK11, TP53, TSC1, TSC2, and VHL.  The following genes were evaluated for sequence changes only: SDHA and HOXB13 c.251G>A variant only.   11/04/2019 Imaging   CT AP  IMPRESSION: 1. Circumferential bowel wall thickening with adjacent fat stranding throughout the colon, most predominant in the transverse colon. This is consistent with colitis, which may be infectious or inflammatory in etiology. 2. Ill-defined pancreatic head mass consistent with known malignancy, similar to mildly increased in comparison to prior CT. There are innumerable hypodense masses throughout the  liver, increased in conspicuity in comparison to prior CT. Findings are worrisome for worsening metastatic disease. 3. Filling defect in the LEFT internal iliac vein, likely a thrombus with differential considerations including mixing artifact.      REVIEW OF SYSTEMS:   Constitutional: Denies fevers, chills  Respiratory: Denies cough, dyspnea or wheezes Cardiovascular: Denies palpitation, chest discomfort Gastrointestinal: Reports abdominal fullness this morning.  Denies nausea and vomiting.  Denies diarrhea.  Denies abdominal cramping. Skin: Denies abnormal skin rashes Lymphatics: Denies new lymphadenopathy or easy bruising Neurological:Denies numbness, tingling or new weaknesses Behavioral/Psych: Mood is stable, no new changes  Extremities: No lower extremity edema All other systems were reviewed with the patient and are negative.  I have reviewed the past medical history, past surgical history, social history and family history with the patient and they are unchanged from previous note.   PHYSICAL EXAMINATION: ECOG PERFORMANCE STATUS: 3  Vitals:   12/05/19 0823 12/05/19 1447  BP: (!) 160/87 (!) 165/91  Pulse: 72 77  Resp: 17 16  Temp: 98.3 F (36.8 C) 98.4 F (36.9 C)  SpO2: 100% 100%   Filed Weights   12/04/19 0534 12/04/19 1354 12/05/19 0540  Weight: 177 lb 4 oz (80.4 kg) 176 lb 2.4 oz (79.9 kg) 176 lb 12.9 oz (80.2 kg)    Intake/Output from previous day: 11/09 0701 - 11/10 0700 In: 2457.2 [I.V.:2457.2] Out: -   GENERAL:alert, no distress and comfortable SKIN: skin color, texture, turgor are normal, no rashes or significant lesions LUNGS: clear to auscultation and percussion with normal breathing effort HEART: regular rate & rhythm and no murmurs and no lower extremity edema ABDOMEN: Positive  bowel sounds, soft, nontender Musculoskeletal:no cyanosis of digits and no clubbing  NEURO: alert & oriented x 3 with fluent speech, no focal motor/sensory  deficits  LABORATORY DATA:  I have reviewed the data as listed CMP Latest Ref Rng & Units 12/05/2019 12/04/2019 12/03/2019  Glucose 70 - 99 mg/dL 105(H) 115(H) 110(H)  BUN 6 - 20 mg/dL <5(L) <5(L) <5(L)  Creatinine 0.44 - 1.00 mg/dL 0.51 0.56 0.57  Sodium 135 - 145 mmol/L 137 136 137  Potassium 3.5 - 5.1 mmol/L 3.2(L) 2.9(L) 3.2(L)  Chloride 98 - 111 mmol/L 105 105 106  CO2 22 - 32 mmol/L '25 25 26  ' Calcium 8.9 - 10.3 mg/dL 9.0 9.1 8.8(L)  Total Protein 6.5 - 8.1 g/dL 6.4(L) 6.3(L) 6.2(L)  Total Bilirubin 0.3 - 1.2 mg/dL 0.6 0.7 0.7  Alkaline Phos 38 - 126 U/L 107 103 98  AST 15 - 41 U/L 56(H) 62(H) 59(H)  ALT 0 - 44 U/L 49(H) 50(H) 44    Lab Results  Component Value Date   WBC 3.2 (L) 12/05/2019   HGB 9.1 (L) 12/05/2019   HCT 27.6 (L) 12/05/2019   MCV 92.6 12/05/2019   PLT 169 12/05/2019   NEUTROABS 1.7 12/05/2019    DG Chest 2 View  Result Date: 11/24/2019 CLINICAL DATA:  Nausea and vomiting. EXAM: CHEST - 2 VIEW COMPARISON:  November 04, 2019 FINDINGS: There is stable left-sided venous Port-A-Cath positioning. The heart size and mediastinal contours are within normal limits. Both lungs are clear. The visualized skeletal structures are unremarkable. IMPRESSION: No active cardiopulmonary disease. Electronically Signed   By: Virgina Norfolk M.D.   On: 11/24/2019 18:52   DG Abd 1 View  Result Date: 12/03/2019 CLINICAL DATA:  Diffuse abdominal pain EXAM: ABDOMEN - 1 VIEW COMPARISON:  11/24/2019 FINDINGS: The bowel gas pattern is normal. No radio-opaque calculi or other significant radiographic abnormality are seen. IMPRESSION: Negative. Electronically Signed   By: Inez Catalina M.D.   On: 12/03/2019 08:21   CT Abdomen Pelvis W Contrast  Result Date: 11/24/2019 CLINICAL DATA:  Abdominal distension, pain, nausea, vomiting. Pancreatic cancer EXAM: CT ABDOMEN AND PELVIS WITH CONTRAST TECHNIQUE: Multidetector CT imaging of the abdomen and pelvis was performed using the standard  protocol following bolus administration of intravenous contrast. CONTRAST:  139m OMNIPAQUE IOHEXOL 300 MG/ML  SOLN COMPARISON:  11/04/2019 FINDINGS: Lower chest: Scarring at the left lung base.  No acute abnormality. Hepatobiliary: Numerous lesions throughout the liver, enlarging since prior study. Index right hepatic dome lesion measures 2.1 cm compared to 1.6 cm previously. Inferior right hepatic lesion measures 1.4 cm compared with 9 mm previously. Gallbladder unremarkable. Pancreas: Pancreatic head mass measuring up to 3.4 cm compared to 3.2 cm previously, likely not significantly changed. Pancreatic ductal dilatation in the body and tail. Spleen: No focal abnormality.  Normal size. Adrenals/Urinary Tract: No adrenal abnormality. No focal renal abnormality. No stones or hydronephrosis. Urinary bladder is unremarkable. Stomach/Bowel: Stomach, large and small bowel grossly unremarkable. Normal appendix. Vascular/Lymphatic: No evidence of aneurysm or adenopathy. Reproductive: No mass or abnormality noted. Other: No free fluid or free air. Musculoskeletal: No acute bony abnormality. IMPRESSION: Pancreatic head mass again noted compatible with patient's given history of pancreatic cancer. Numerous metastases throughout the liver, enlarging since prior study. Electronically Signed   By: KRolm BaptiseM.D.   On: 11/24/2019 17:02    ASSESSMENT AND PLAN: 1.  Metastatic pancreatic adenocarcinoma 2.  Intractable nausea and vomiting secondary to chemotherapy and cancer, persistent and refractory  3.  Dehydration, improved 4.  Anemia secondary to chemotherapy 5.  Left iliac vein DVT 6.  Lupus 7.  Hypertension 8.  Migraines 9.  Anxiety 10 persistent hypokalemia   Plan -Patient is on maximum antiemetics, and still has persistent nausea and vomiting and could not tolerate oral intake including oral medicine.  Abdominal x-ray 2 days ago was negative for bowel obstruction.  Will ask GI input to see if she has  partial upper GI obstruction and mobility issue of stomach which causes her symptoms. If not, then her symptoms are likely related to her underline malignancy -She is scheduled to start TPN for nutrition -I spoke with Dr. Erlinda Hong, and will f/u    Truitt Merle  12/05/2019

## 2019-12-06 ENCOUNTER — Inpatient Hospital Stay: Payer: BC Managed Care – PPO

## 2019-12-06 ENCOUNTER — Inpatient Hospital Stay: Payer: BC Managed Care – PPO | Admitting: Nutrition

## 2019-12-06 ENCOUNTER — Inpatient Hospital Stay: Payer: BC Managed Care – PPO | Admitting: Nurse Practitioner

## 2019-12-06 ENCOUNTER — Inpatient Hospital Stay: Payer: BC Managed Care – PPO | Admitting: General Practice

## 2019-12-06 ENCOUNTER — Inpatient Hospital Stay (HOSPITAL_COMMUNITY): Payer: BC Managed Care – PPO

## 2019-12-06 DIAGNOSIS — E876 Hypokalemia: Secondary | ICD-10-CM | POA: Diagnosis not present

## 2019-12-06 DIAGNOSIS — R112 Nausea with vomiting, unspecified: Secondary | ICD-10-CM | POA: Diagnosis not present

## 2019-12-06 LAB — COMPREHENSIVE METABOLIC PANEL
ALT: 49 U/L — ABNORMAL HIGH (ref 0–44)
AST: 51 U/L — ABNORMAL HIGH (ref 15–41)
Albumin: 3.1 g/dL — ABNORMAL LOW (ref 3.5–5.0)
Alkaline Phosphatase: 118 U/L (ref 38–126)
Anion gap: 7 (ref 5–15)
BUN: 5 mg/dL — ABNORMAL LOW (ref 6–20)
CO2: 26 mmol/L (ref 22–32)
Calcium: 9.6 mg/dL (ref 8.9–10.3)
Chloride: 105 mmol/L (ref 98–111)
Creatinine, Ser: 0.54 mg/dL (ref 0.44–1.00)
GFR, Estimated: >60 mL/min (ref >60)
Glucose, Bld: 98 mg/dL (ref 70–99)
Potassium: 4.2 mmol/L (ref 3.5–5.1)
Sodium: 138 mmol/L (ref 135–145)
Total Bilirubin: 0.6 mg/dL (ref 0.3–1.2)
Total Protein: 6.9 g/dL (ref 6.5–8.1)

## 2019-12-06 LAB — MAGNESIUM: Magnesium: 1.9 mg/dL (ref 1.7–2.4)

## 2019-12-06 LAB — GLUCOSE, CAPILLARY
Glucose-Capillary: 111 mg/dL — ABNORMAL HIGH (ref 70–99)
Glucose-Capillary: 105 mg/dL — ABNORMAL HIGH (ref 70–99)
Glucose-Capillary: 106 mg/dL — ABNORMAL HIGH (ref 70–99)

## 2019-12-06 LAB — PHOSPHORUS: Phosphorus: 4.3 mg/dL (ref 2.5–4.6)

## 2019-12-06 MED ORDER — TRAVASOL 10 % IV SOLN
INTRAVENOUS | Status: AC
  Filled 2019-12-06: qty 858

## 2019-12-06 MED ORDER — DEXTROSE-NACL 5-0.9 % IV SOLN: INTRAVENOUS | Status: AC

## 2019-12-06 MED ORDER — SUCRALFATE 1 GM/10ML PO SUSP
1.0000 g | Freq: Three times a day (TID) | ORAL | Status: DC
  Administered 2019-12-06 – 2019-12-12 (×22): 1 g via ORAL
  Filled 2019-12-06 (×23): qty 10

## 2019-12-06 NOTE — Progress Notes (Signed)
Physical Therapy Treatment Patient Details Name: Cynthia Frye MRN: [id number] DOB: August 22, 1960 Today's Date: 12/06/2019    History of Present Illness 59 year old unfortunate female history of recently diagnosed metastatic pancreatic adenocarcinoma with mets to the liver 09/19/2019, currently undergoing chemotherapy, recent chemotherapy 11/21/2019 presenting with 1 day history of worsening nausea, vomiting, abdominal pain.  Noted to have significant hypokalemia, admitted to [facility]    PT Comments    Pt tolerated increased ambulation distance of 200' with RW, HR 137 max while walking, no loss of balance.   Follow Up Recommendations  No PT follow up     Equipment Recommendations  Rolling walker with 5" wheels    Recommendations for Other Services       Precautions / Restrictions Precautions Precautions: Other (comment) Precaution Comments: CHEMO precautions Restrictions Weight Bearing Restrictions: No    Mobility  Bed Mobility               General bed mobility comments: up in recliner  Transfers Overall transfer level: Needs assistance Equipment used: Rolling walker (2 wheeled) Transfers: Sit to/from Stand Sit to Stand: Supervision         General transfer comment: no loss of balance, good hand placement  Ambulation/Gait Ambulation/Gait assistance: Supervision Gait Distance (Feet): 200 Feet Assistive device: Rolling walker (2 wheeled) Gait Pattern/deviations: Step-through pattern;Decreased stride length Gait velocity: decreased   General Gait Details: steady walking, HR 137 max, no dyspnea   Stairs             Wheelchair Mobility    Modified Rankin (Stroke Patients Only)       Balance Overall balance assessment: Needs assistance Sitting-balance support: No upper extremity supported;Feet supported Sitting balance-Leahy Scale: Good     Standing balance support: No upper extremity supported;During functional activity Standing balance-Leahy  Scale: Fair                              Cognition Arousal/Alertness: Awake/alert Behavior During Therapy: WFL for tasks assessed/performed Overall Cognitive Status: Within Functional Limits for tasks assessed                                        Exercises      General Comments        Pertinent Vitals/Pain Pain Assessment: No/denies pain    Home Living                      Prior Function            PT Goals (current goals can now be found in the care plan section) Acute Rehab PT Goals Patient Stated Goal: be able to exercise and socialize, 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 PT Goal Formulation: With patient Time For Goal Achievement: 12/09/19 Potential to Achieve Goals: Good Progress towards PT goals: Progressing toward goals    Frequency    Min 3X/week      PT Plan Current plan remains appropriate    Co-evaluation              AM-PAC PT "6 Clicks" Mobility   Outcome Measure  Help needed turning from your back to your side while in a flat bed without using bedrails?: None Help needed moving from lying on your back to sitting on the side of a flat bed without using bedrails?: A  Little Help needed moving to and from a bed to a chair (including a wheelchair)?: None Help needed standing up from a chair using your arms (e.g., wheelchair or bedside chair)?: None Help needed to walk in hospital room?: None Help needed climbing 3-5 steps with a railing? : A Little 6 Click Score: 22    End of Session Equipment Utilized During Treatment: Gait belt Activity Tolerance: Patient tolerated treatment well Patient left: with call bell/phone within reach;in chair Nurse Communication: Mobility status PT Visit Diagnosis: Difficulty in walking, not elsewhere classified (R26.2)     Time: 0349-1791 PT Time Calculation (min) (ACUTE ONLY): 13 min  Charges:  $Gait Training: 8-22 mins                     Blondell Reveal  [place] PT 12/06/2019  Acute Rehabilitation Services Pager [phone] Office [phone]

## 2019-12-06 NOTE — Progress Notes (Addendum)
TPN order in Epic for 40 ml/hr was accidentally d/ced this morning. Please continue TPN at 40 ml/hr until 6p. At 6pm, hang new TPN bag  at 65 ml/hr.  Informed pt's RN [facility]) of plan.  Dia Sitter, PharmD, BCPS 12/06/2019 11:02 AM

## 2019-12-06 NOTE — Progress Notes (Signed)
   12/06/19 1354  Assess: MEWS Score  Temp 98.1 F (36.7 C)  BP 115/80  Pulse Rate (!) 137  Resp 18  SpO2 100 %  Assess: MEWS Score  MEWS Temp 0  MEWS Systolic 0  MEWS Pulse 3  MEWS RR 0  MEWS LOC 0  MEWS Score 3  MEWS Score Color Yellow  Assess: if the MEWS score is Yellow or Red  Were vital signs taken at a resting state? Yes  Focused Assessment No change from prior assessment  Early Detection of Sepsis Score *See Row Information* Low  MEWS guidelines implemented *See Row Information* Yes  Treat  MEWS Interventions Administered scheduled meds/treatments  Take Vital Signs  Increase Vital Sign Frequency  Yellow: Q 2hr X 2 then Q 4hr X 2, if remains yellow, continue Q 4hrs  Escalate  MEWS: Escalate Yellow: discuss with charge nurse/RN and consider discussing with provider and RRT  Notify: Charge Nurse/RN  Name of Charge Nurse/RN Notified Kim RN   Date Charge Nurse/RN Notified 12/06/19  Time Charge Nurse/RN Notified 1400  Document  Patient Outcome Stabilized after interventions  Progress note created (see row info) Yes

## 2019-12-06 NOTE — Progress Notes (Signed)
PHARMACY - TOTAL PARENTERAL NUTRITION CONSULT NOTE   Indication: intolerance to enteral feeding  Patient Measurements: Height: 5\' 7"  (170.2 cm) Weight: 80.2 kg (176 lb 12.9 oz) IBW/kg (Calculated) : 61.6 TPN AdjBW (KG): 66 Body mass index is 27.69 kg/m. Usual Weight:   Assessment: Patient is a 59 y.o. F with metastatic pancreatic cancer on chemotherapy PTA presented to the ED on 10/30 with c/o abdominal pain and n/v. With patient unable to tolerate oral intake, pharmacy is consulted to start TPN for nutrition.  Glucose / Insulin: no hx DM; on sensitive SSI q6h (used 1 unit since TPN started yesterday) - CBGs (goal <150): 109-124 Electrolytes: K up 4.2 (s/p 5 runs kcl on 11/10),CorrCa 10.32, phos up 4.3 Renal: scr stable, BUN 5 LFTs / TGs: AST/ALT 51/49, Tbili wnl Prealbumin / albumin: prealbumin 9, albumin 3 Intake / Output; MIVF: I/O +321 - D5NS at 60 ml/hr GI Imaging: - 11/8 abd xray: negative Surgeries / Procedures:   Central access: port-a cath TPN start date: 12/05/19  Nutritional Goals (per RD recommendation on 11/9): Kcal:  2200-2400 Protein:  115-125g Fluid:  2L/day  Goal TPN rate is 90 mL/hr (provides 119 g of protein and 2372 kcals per day)  Current Nutrition:  - NPO  - start TPN on 11/10  - Of note, patient has peanut allergy (rxns= hives). Will use SMOFlipid  Plan:  - increase TPN to 65 mL/hr at 1800 - Electrolytes in TPN: 81mEq/L of Na, decrease to 30mEq/L of K, remove Ca, 57mEq/L of Mg, and decrease to 5 mmol/L of Phos. Cl:Ac ratio 1:1 Add standard MVI and trace elements to TPN - Initiate Sensitive q6h SSI and adjust as needed  - Reduce MIVF to 30 mL/hr at 1800 - BMET, phos and mag on 12/07/19 - Monitor TPN labs on Mon/Thurs  Analee Montee P 12/06/2019,7:32 AM

## 2019-12-06 NOTE — Progress Notes (Signed)
PROGRESS NOTE    Cynthia Frye  WFU:[id number] DOB: [birth year] DOA: 11/24/2019 PCP: Josetta Huddle, MD    Chief Complaint  Patient presents with  . Emesis    Brief Narrative:   59 year old unfortunate female history of recently diagnosed metastatic pancreatic adenocarcinoma with mets to the liver 09/19/2019, currently undergoing chemotherapy, recent chemotherapy 11/21/2019 presenting with 1 day history of worsening nausea, vomiting, abdominal pain. Noted to have significant hypokalemia with inability to keep anything down. CT abdomen and pelvis negative for small bowel obstruction. Urinalysis concerning for UTI. Patient started on IV antibiotics, scheduled IV antiemetics, IV fluids, potassium repletion  Subjective:   feeling better this morning, report feeling better with Dilaudid and Compazine given together this morning Tolerating TPN GI evaluated her this morning plan for upper GI series  Assessment & Plan:   Principal Problem:   Hypokalemia Active Problems:   Essential hypertension   Lupus (systemic lupus erythematosus) (HCC)   Seasonal allergies   Chronic migraine without aura without status migrainosus, not intractable   Pancreatic cancer (HCC)   Acute deep vein thrombosis of left iliac vein (HCC)   Nausea and vomiting in adult   Dehydration   Prolonged QT interval   Emesis   Tachycardia   Anemia associated with chemotherapy   Constipation   Generalized abdominal pain   Malnutrition of moderate degree  Intractable nausea vomiting, dehydration On around-the-clock as needed Zofran, Compazine Only increased dose Zyprexa TPN started today due to poor oral intake Continue PPI GI input appreciated, upper GI series this morning  Hypokalemia hypomagnesemia Replace to keep K above 4, mag above 2  QTC prolongation Resolved, monitor QTC every morning  Possible UTI -Completed 3 days of IV Rocephin  Pancreatic attic no carcinoma in the head with multiple liver  lesions likely liver metastasis Diagnosed in August 2021, received first cycle of chemo FOLFIRINOX 10/24/2019, second cycle on November 21, 2019 Oncology Dr. Burr Medico following  Normocytic anemia in the setting of malignancy Hemoglobin stable above 9  Recent left leg DVT Currently on full dose Lovenox  History of lupus Follows rheumatologist Dr. Lenna Gilford, on weekly methotrexate injection Plaquenil diclofenac  Hypertension On IV Lopressor due to inconsistent oral intake  DVT prophylaxis: Full dose Lovenox   Code Status: Full Family Communication: Patient Disposition:   Status is: Inpatient  Dispo: The patient is from: Home              Anticipated d/c is to: Home              Anticipated d/c date is: To be determined                Consultants:   Oncology Dr Burr Medico  GI  Procedures:   None  Antimicrobials:   As above     Objective: Vitals:   12/05/19 0823 12/05/19 1447 12/05/19 2122 12/06/19 0626  BP: (!) 160/87 (!) 165/91 (!) 161/89 (!) 137/93  Pulse: 72 77 87 (!) 109  Resp: 17 16 16 18   Temp: 98.3 F (36.8 C) 98.4 F (36.9 C) 98.9 F (37.2 C) 99.2 F (37.3 C)  TempSrc: Oral Oral Oral Oral  SpO2: 100% 100% 100% 100%  Weight:      Height:        Intake/Output Summary (Last 24 hours) at 12/06/2019 1138 Last data filed at 12/06/2019 2025 Gross per 24 hour  Intake 721.13 ml  Output 400 ml  Net 321.13 ml   Filed Weights   12/04/19 0534 12/04/19  1354 12/05/19 0540  Weight: 80.4 kg 79.9 kg 80.2 kg    Examination:  General exam: calm, NAD Respiratory system: Clear to auscultation. Respiratory effort normal. Cardiovascular system: S1 & S2 heard, RRR. No JVD, no murmur, No pedal edema. Gastrointestinal system: Abdomen is nondistended, soft and nontender.  Normal bowel sounds heard. Central nervous system: Alert and oriented. No focal neurological deficits. Extremities: Symmetric 5 x 5 power. Skin: No rashes, lesions or ulcers Psychiatry: Judgement and  insight appear normal. Mood & affect appropriate.     Data Reviewed: I have personally reviewed following labs and imaging studies  CBC: Recent Labs  Lab 12/01/19 0639 12/02/19 0622 12/03/19 0514 12/04/19 0544 12/05/19 0615  WBC 3.5* 3.6* 3.8* 3.7* 3.2*  NEUTROABS  --   --   --   --  1.7  HGB 9.3* 9.2* 9.3* 9.2* 9.1*  HCT 28.3* 27.8* 27.9* 28.5* 27.6*  MCV 90.4 91.1 90.6 92.2 92.6  PLT 179 167 165 162 938    Basic Metabolic Panel: Recent Labs  Lab 12/02/19 0622 12/03/19 0514 12/04/19 0544 12/05/19 0615 12/06/19 0833  NA 138 137 136 137 138  K 3.1* 3.2* 2.9* 3.2* 4.2  CL 106 106 105 105 105  CO2 26 26 25 25 26   GLUCOSE 116* 110* 115* 105* 98  BUN <5* <5* <5* <5* 5*  CREATININE 0.52 0.57 0.56 0.51 0.54  CALCIUM 8.9 8.8* 9.1 9.0 9.6  MG 1.8 2.0 1.8 2.0 1.9  PHOS  --   --   --  3.4 4.3    GFR: Estimated Creatinine Clearance: 83.5 mL/min (by C-G formula based on SCr of 0.54 mg/dL).  Liver Function Tests: Recent Labs  Lab 12/02/19 0622 12/03/19 0514 12/04/19 0544 12/05/19 0615 12/06/19 0833  AST 69* 59* 62* 56* 51*  ALT 41 44 50* 49* 49*  ALKPHOS 96 98 103 107 118  BILITOT 0.8 0.7 0.7 0.6 0.6  PROT 6.1* 6.2* 6.3* 6.4* 6.9  ALBUMIN 2.9* 2.9* 3.0* 3.0* 3.1*    CBG: Recent Labs  Lab 12/05/19 0753 12/05/19 1157 12/05/19 1820 12/05/19 2357 12/06/19 0628  GLUCAP 106* 112* 124* 109* 111*     No results found for this or any previous visit (from the past 240 hour(s)).       Radiology Studies: No results found.      Scheduled Meds: . Chlorhexidine Gluconate Cloth  6 each Topical Daily  . enoxaparin  120 mg Subcutaneous Q24H  . fluticasone  2 spray Each Nare Daily  . insulin aspart  0-9 Units Subcutaneous Q6H  . lipase/protease/amylase  24,000 Units Oral TID WC  . metoprolol tartrate  5 mg Intravenous Q8H  . OLANZapine  7.5 mg Oral QHS  . pantoprazole  40 mg Oral Q1200  . polyethylene glycol  17 g Oral Daily  . senna-docusate  1 tablet  Oral BID  . sodium chloride flush  10-40 mL Intracatheter Q12H  . sodium chloride flush  3 mL Intravenous Q12H  . sucralfate  1 g Oral TID WC & HS  . topiramate  50 mg Oral QHS   Continuous Infusions: . dextrose 5 % and 0.9% NaCl 60 mL/hr at 12/06/19 0608  . dextrose 5 % and 0.9% NaCl    . TPN ADULT (ION)       LOS: 11 days   Time spent: 66mins Greater than 50% of this time was spent in counseling, explanation of diagnosis, planning of further management, and coordination of care.  I have personally  reviewed and interpreted on  12/06/2019 daily labs, tele strips, I reviewed all nursing notes, pharmacy notes, consultant notes,  vitals, pertinent old records  I have discussed plan of care as described above with RN , patient  on 12/06/2019  Voice Recognition /Dragon dictation system was used to create this note, attempts have been made to correct errors. Please contact the author with questions and/or clarifications.   Florencia Reasons, MD PhD FACP [facility]  Available via Epic secure chat 7am-7pm for nonurgent issues Please page for urgent issues To page the attending provider between 7A-7P or the covering provider during after hours 7P-7A, please log into the web site [email or web address] and access using universal  password for that web site. If you do not have the password, please call the hospital operator.    12/06/2019, 11:38 AM

## 2019-12-06 NOTE — Consult Note (Signed)
Referring Provider: [facility] Primary Care Physician:  Josetta Huddle, MD Primary Gastroenterologist:  [facility] Primary  Reason for Consultation: Nausea and vomiting  HPI: Cynthia Frye is a 59 y.o. female with past medical history of metastatic pancreatic adenocarcinoma with mets to liver diagnosed in August 2021 was on first cycle of chemotherapy admitted to the hospital on November 24, 2019 with worsening nausea and vomiting.  She was admitted to hospital from October 10 to October 15 for nausea vomiting abdominal pain and diarrhea.  Was diagnosed with possible chemotherapy-induced colitis.  C. difficile and GI pathogen panel were negative.  She was treated with empiric antibiotics and was subsequently discharged home.  Came back after 2 weeks for worsening nausea or vomiting.  Patient is currently on IV Zofran, Compazine and Reglan alternatively every 2-4 hours without any improvement in symptoms.  CT abdomen pelvis with contrast on November 24, 2019 showed normal-appearing stomach, small and large intestine.  It showed pancreatic head mass as well as numerous enlarging liver metastasis.  Patient seen and examined at bedside. Her nausea and vomiting was not associated with food intake. It was random in occurrence. She is feeling better since yesterday. Denies any further vomiting. Only few episodes of nausea. Currently on TPN. Diarrhea is improving. Continues to have generalized abdominal pain.  Past Medical History:  Diagnosis Date  . Allergies    peanuts, corn, beans, red grapefruit, naval oranges  . Asthma    allergy shots and medication  . Cancer [facility])    pancreatic  . Collagen vascular disease ([place])   . DDD (degenerative disc disease), lumbar   . Eustachian tube dysfunction 12/2012   rhinitis, vertigo- Dr. Wilburn Cornelia, ENT   . Fibroids   . Heart murmur    Echo 1/18: EF 55-60, no RWMA, normal diastolic function, trivial AI, PASP 32  . History of cardiac catheterization    LHC 3/18: normal  coronary arteries.   . History of nuclear stress test    ETT-Myoview 2/18: EF 62, + ECG response; apical and distal septal ischemia; intermediate risk.  Marland Kitchen Hypertension   . Iron deficiency anemia   . Lupus [facility]) 2011   Dr. Trudie Reed  . Migraine headache    trial of generic maxalt 10 mg, January 2021  . Obesity   . Seasonal allergies   . Seizure in childhood [facility])    as a child no treatment none x 30 years    Past Surgical History:  Procedure Laterality Date  . ABDOMINAL HYSTERECTOMY    . BACK SURGERY  2009  . COLONOSCOPY WITH PROPOFOL N/A 04/11/2012   Procedure: COLONOSCOPY WITH PROPOFOL;  Surgeon: Garlan Fair, MD;  Location: [facility];  Service: Endoscopy;  Laterality: N/A;  . HERNIA REPAIR     as child unbilical  . Q6-S partial discectomy and laminectomy     Dr. Christella Noa  . LEFT HEART CATH AND CORONARY ANGIOGRAPHY N/A 04/09/2016   Procedure: Left Heart Cath and Coronary Angiography;  Surgeon: Nelva Bush, MD;  Location: [place] [facility];  Service: Cardiovascular;  Laterality: N/A;  . MOUTH SURGERY    . PORTACATH PLACEMENT N/A 10/11/2019   Procedure: INSERTION PORT-A-CATH LEFT SUBCLAVIAN;  Surgeon: [name], MD;  Location: [place];  Service: General;  Laterality: N/A;    Prior to Admission medications   Medication Sig Start Date End Date Taking? Authorizing Provider  acetaminophen (TYLENOL) 500 MG tablet Take 1,000 mg by mouth every 6 (six) hours as needed for moderate pain.  Yes [provider]  albuterol (PROVENTIL HFA;VENTOLIN HFA) 108 (90 Base) MCG/ACT inhaler Inhale 2 puffs into the lungs every 6 (six) hours as needed for wheezing or shortness of breath. 04/29/16  Yes Jeffery, Domingo Mend, PA  ALPRAZolam (XANAX) 0.25 MG tablet Take 1 tablet (0.25 mg total) by mouth at bedtime as needed for anxiety. 10/18/19  Yes Truitt Merle, MD  Bepotastine Besilate (BEPREVE) 1.5 % SOLN Place 1 drop into both eyes 2 (two) times daily as needed (dry eyes).    Yes  [provider]  Cholecalciferol (VITAMIN D) 2000 units CAPS Take 2,000 Units by mouth daily.    Yes [provider]  dexamethasone (DECADRON) 4 MG tablet Take 1 tablet (4 mg total) by mouth daily. Take 1 tablet once daily for 3 to 5 days after chemo 10/29/19  Yes Alla Feeling, NP  diclofenac (VOLTAREN) 75 MG EC tablet Take 75 mg by mouth 2 (two) times daily. 10/17/19  Yes [provider]  Dietary Management Product (RHEUMATE) CAPS Take 1 capsule by mouth daily.    Yes [provider]  diphenoxylate-atropine (LOMOTIL) 2.5-0.025 MG tablet Take 2 tablets by mouth 4 (four) times daily as needed for diarrhea or loose stools (use 2nd, if Imodium doesn't work. Max dose: 8 tablets/day.). Patient taking differently: Take 2 tablets by mouth 4 (four) times daily as needed for diarrhea or loose stools.  11/09/19  Yes Orson Slick, MD  DIPROLENE 0.05 % ointment Apply 1 application topically daily as needed (skin irritation).  04/08/15  Yes [provider]  dronabinol (MARINOL) 2.5 MG capsule Take 1 capsule (2.5 mg total) by mouth 2 (two) times daily before a meal. Patient taking differently: Take 2.5 mg by mouth 2 (two) times daily as needed (nausea).  11/14/19  Yes Truitt Merle, MD  enoxaparin (LOVENOX) 120 MG/0.8ML injection Inject 0.8 mLs (120 mg total) into the skin daily. 11/10/19  Yes Orson Slick, MD  esomeprazole (NEXIUM) 10 MG packet Take 10 mg by mouth daily as needed (heartburn).    Yes [provider]  feeding supplement (ENSURE ENLIVE / ENSURE PLUS) LIQD Take 237 mLs by mouth 2 (two) times daily between meals. 11/09/19  Yes Hongalgi, Lenis Dickinson, MD  ferrous sulfate 325 (65 FE) MG tablet Take 325 mg by mouth daily with breakfast.   Yes [provider]  fluticasone (FLONASE) 50 MCG/ACT nasal spray Place 2 sprays into both nostrils daily. 04/29/16  Yes Jeffery, Domingo Mend, PA  hydroxychloroquine (PLAQUENIL) 200 MG tablet Take 400 mg by mouth  daily.  06/17/14  Yes [provider]  lidocaine-prilocaine (EMLA) cream Apply to affected area once Patient taking differently: Apply 1 application topically daily as needed (for port access).  10/18/19  Yes Truitt Merle, MD  loperamide (IMODIUM) 2 MG capsule Take 2 capsules (4 mg total) by mouth 2 (two) times daily as needed for diarrhea or loose stools (Use 1st). 11/09/19  Yes Hongalgi, Lenis Dickinson, MD  loratadine (CLARITIN) 10 MG tablet Take 10 mg by mouth daily.   Yes [provider]  metoprolol succinate (TOPROL-XL) 50 MG 24 hr tablet Take 50 mg by mouth daily.    Yes [provider]  ondansetron (ZOFRAN) 8 MG tablet Take 1 tablet (8 mg total) by mouth 2 (two) times daily as needed. Start on day 3 after chemotherapy. Patient taking differently: Take 8 mg by mouth 2 (two) times daily as needed for nausea or vomiting. Start on day 3 after chemotherapy.  10/18/19  Yes Truitt Merle, MD  oxyCODONE (OXY IR/ROXICODONE) 5 MG immediate release tablet Take 1 tablet (5 mg total) by mouth every 6 (six) hours as needed for severe pain. 11/16/19  Yes Truitt Merle, MD  pantoprazole (PROTONIX) 40 MG tablet Take 40 mg by mouth daily.   Yes [provider]  potassium chloride 20 MEQ/15ML (10%) SOLN Take 15 mLs (20 mEq total) by mouth 2 (two) times daily. 11/21/19  Yes Alla Feeling, NP  prochlorperazine (COMPAZINE) 10 MG tablet Take 1 tablet (10 mg total) by mouth every 6 (six) hours as needed (Nausea or vomiting). Patient taking differently: Take 10 mg by mouth every 6 (six) hours as needed for nausea or vomiting.  10/18/19  Yes Truitt Merle, MD  topiramate (TOPAMAX) 50 MG tablet Take 1 tablet (50 mg total) by mouth at bedtime. 11/09/19  Yes Hongalgi, Lenis Dickinson, MD  KLOR-CON M20 20 MEQ tablet TAKE 1 TABLET BY MOUTH TWICE A DAY Patient not taking: Reported on 11/24/2019 11/20/19   Truitt Merle, MD  LORazepam (ATIVAN) 0.5 MG tablet Take 1 tablet (0.5 mg total) by mouth every 8 (eight) hours as needed  for anxiety (and anticiaptory n/v. DO NOT TAKE WITH XANAX). 11/21/19   Alla Feeling, NP  rizatriptan (MAXALT-MLT) 10 MG disintegrating tablet Take 1 tablet (10 mg total) by mouth as needed for migraine. May repeat in 2 hours if needed Patient not taking: Reported on 11/24/2019 04/19/19   Melvenia Beam, MD    Scheduled Meds: . Chlorhexidine Gluconate Cloth  6 each Topical Daily  . enoxaparin  120 mg Subcutaneous Q24H  . fluticasone  2 spray Each Nare Daily  . insulin aspart  0-9 Units Subcutaneous Q6H  . lipase/protease/amylase  24,000 Units Oral TID WC  . metoprolol tartrate  5 mg Intravenous Q8H  . OLANZapine  7.5 mg Oral QHS  . pantoprazole  40 mg Oral Q1200  . polyethylene glycol  17 g Oral Daily  . senna-docusate  1 tablet Oral BID  . sodium chloride flush  10-40 mL Intracatheter Q12H  . sodium chloride flush  3 mL Intravenous Q12H  . topiramate  50 mg Oral QHS   Continuous Infusions: . dextrose 5 % and 0.9% NaCl 60 mL/hr at 12/06/19 0608  . TPN ADULT (ION) 40 mL/hr at 12/05/19 1724   PRN Meds:.acetaminophen **OR** acetaminophen, hydrALAZINE, HYDROmorphone (DILAUDID) injection, hydrOXYzine, levalbuterol, loperamide, LORazepam, metoCLOPramide (REGLAN) injection, ondansetron (ZOFRAN) IV, oxyCODONE, prochlorperazine, sodium chloride flush  Allergies as of 11/24/2019 - Review Complete 11/24/2019  Allergen Reaction Noted  . Cinnamon Other (See Comments) 04/07/2016  . Peanut-containing drug products Hives 04/07/2016  . Prednisone Other (See Comments) 10/28/2019  . Corn oil Hives 04/07/2016  . Corn-containing products Hives 04/07/2016  . Other Rash 04/07/2016    Family History  Problem Relation Age of Onset  . Asthma Mother   . Diabetes Mother   . Hypertension Mother   . Heart attack Father 22  . CAD Father   . Stroke Maternal Aunt   . Stroke Paternal Aunt   . Stroke Paternal Uncle   . Gout Other        uncles   . Cancer Niece        paternal half sister's daughter;  unknown type; unknown age diagnosed  . Ovarian cancer Neg Hx   . Breast cancer Neg Hx   . Colon cancer Neg Hx   . Migraines Neg Hx     Social History  Socioeconomic History  . Marital status: Married    Spouse name: Not on file  . Number of children: 0  . Years of education: Not on file  . Highest education level: Some college, no degree  Occupational History  . Occupation: Customer service manager   Tobacco Use  . Smoking status: Never Smoker  . Smokeless tobacco: Never Used  Vaping Use  . Vaping Use: Never used  Substance and Sexual Activity  . Alcohol use: No  . Drug use: No  . Sexual activity: Not on file  Other Topics Concern  . 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 Difficulty of Paying Living Expenses: Not on file  Food Insecurity:   . Worried About Charity fundraiser in the Last Year: Not on file  . Ran Out of Food in the Last Year: Not on file  Transportation Needs:   . Lack of Transportation (Medical): Not on file  . Lack of Transportation (Non-Medical): Not on file  Physical Activity:   . Days of Exercise per Week: Not on file  . Minutes of Exercise per Session: Not on file  Stress:   . Feeling of Stress : Not on file  Social Connections:   . Frequency of Communication with Friends and Family: Not on file  . Frequency of Social Gatherings with Friends and Family: Not on file  . Attends Religious Services: Not on file  . Active Member of Clubs or Organizations: Not on file  . Attends Archivist Meetings: Not on file  . Marital Status: Not on file  Intimate Partner Violence:   . Fear of Current or Ex-Partner: Not on file  . Emotionally Abused: Not on file  . Physically Abused:  Not on file  . Sexually Abused: Not on file    Review of Systems: Review of Systems  Constitutional: Positive for malaise/fatigue and weight loss. Negative for chills and fever.  HENT: Negative for hearing loss and tinnitus.   Eyes: Negative for blurred vision and double vision.  Respiratory: Negative for cough and hemoptysis.   Cardiovascular: Negative for chest pain and palpitations.  Gastrointestinal: Positive for abdominal pain, diarrhea, nausea and vomiting. Negative for blood in stool, constipation and melena.  Genitourinary: Negative for dysuria and urgency.  Musculoskeletal: Positive for back pain and myalgias.  Skin: Negative for itching and rash.  Neurological: Negative for seizures and loss of consciousness.  Endo/Heme/Allergies: Does not bruise/bleed easily.  Psychiatric/Behavioral: Negative for substance abuse and suicidal ideas.    Physical Exam: Vital signs: Vitals:   12/05/19 2122 12/06/19 0626  BP: (!) 161/89 (!) 137/93  Pulse: 87 (!) 109  Resp: 16 18  Temp: 98.9 F (37.2 C) 99.2 F (37.3 C)  SpO2: 100% 100%   Last BM Date: 12/05/19 Physical Exam Vitals and nursing note reviewed.  Constitutional:      General: She is not in acute distress.    Appearance: Normal appearance.  HENT:     Head: Normocephalic and atraumatic.     Nose: Nose normal.     Mouth/Throat:     Mouth: Mucous membranes are moist.     Pharynx: Oropharynx  is clear. No oropharyngeal exudate.  Eyes:     General: No scleral icterus.    Extraocular Movements: Extraocular movements intact.  Cardiovascular:     Rate and Rhythm: Tachycardia present.     Heart sounds: No murmur heard.   Pulmonary:     Effort: Pulmonary effort is normal. No respiratory distress.     Breath sounds: Normal breath sounds.  Abdominal:     General: Bowel sounds are normal. There is distension.     Palpations: Abdomen is soft.     Tenderness: There is abdominal tenderness. There is guarding.  Musculoskeletal:         General: No swelling or tenderness.     Cervical back: Normal range of motion and neck supple.  Skin:    General: Skin is warm.  Neurological:     Mental Status: She is alert and oriented to person, place, and time.  Psychiatric:        Mood and Affect: Mood normal.        Thought Content: Thought content normal.     GI:  Lab Results: Recent Labs    12/04/19 0544 12/05/19 0615  WBC 3.7* 3.2*  HGB 9.2* 9.1*  HCT 28.5* 27.6*  PLT 162 169   BMET Recent Labs    12/04/19 0544 12/05/19 0615  NA 136 137  K 2.9* 3.2*  CL 105 105  CO2 25 25  GLUCOSE 115* 105*  BUN <5* <5*  CREATININE 0.56 0.51  CALCIUM 9.1 9.0   LFT Recent Labs    12/05/19 0615  PROT 6.4*  ALBUMIN 3.0*  AST 56*  ALT 49*  ALKPHOS 107  BILITOT 0.6   PT/INR No results for input(s): LABPROT, INR in the last 72 hours.   Studies/Results: No results found.  Impression/Plan: -Persistent nausea and vomiting in patient with metastatic pancreatic cancer. She denies any associated dysphagia or odynophagia. Symptoms are improving. Currently on TPN.  Recommendations ------------------------- -Start Carafate -Check for upper GI series -Continue current management -GI will follow  Otis Brace MD, [place] 12/06/2019, 9:36 AM  Contact #  [phone]    LOS: 11 days   Otis Brace  MD, FACP 12/06/2019, 8:46 AM  Contact #  [phone]

## 2019-12-07 ENCOUNTER — Other Ambulatory Visit: Payer: Self-pay | Admitting: Hematology

## 2019-12-07 DIAGNOSIS — Z7189 Other specified counseling: Secondary | ICD-10-CM

## 2019-12-07 DIAGNOSIS — E876 Hypokalemia: Secondary | ICD-10-CM | POA: Diagnosis not present

## 2019-12-07 DIAGNOSIS — Z515 Encounter for palliative care: Secondary | ICD-10-CM | POA: Diagnosis not present

## 2019-12-07 LAB — COMPREHENSIVE METABOLIC PANEL
ALT: 48 U/L — ABNORMAL HIGH (ref 0–44)
AST: 49 U/L — ABNORMAL HIGH (ref 15–41)
Albumin: 3.1 g/dL — ABNORMAL LOW (ref 3.5–5.0)
Alkaline Phosphatase: 130 U/L — ABNORMAL HIGH (ref 38–126)
Anion gap: 8 (ref 5–15)
BUN: 12 mg/dL (ref 6–20)
CO2: 24 mmol/L (ref 22–32)
Calcium: 9.3 mg/dL (ref 8.9–10.3)
Chloride: 106 mmol/L (ref 98–111)
Creatinine, Ser: 0.53 mg/dL (ref 0.44–1.00)
GFR, Estimated: >60 mL/min (ref >60)
Glucose, Bld: 116 mg/dL — ABNORMAL HIGH (ref 70–99)
Potassium: 3.6 mmol/L (ref 3.5–5.1)
Sodium: 138 mmol/L (ref 135–145)
Total Bilirubin: 0.7 mg/dL (ref 0.3–1.2)
Total Protein: 6.5 g/dL (ref 6.5–8.1)

## 2019-12-07 LAB — GLUCOSE, CAPILLARY
Glucose-Capillary: 100 mg/dL — ABNORMAL HIGH (ref 70–99)
Glucose-Capillary: 106 mg/dL — ABNORMAL HIGH (ref 70–99)
Glucose-Capillary: 108 mg/dL — ABNORMAL HIGH (ref 70–99)
Glucose-Capillary: 110 mg/dL — ABNORMAL HIGH (ref 70–99)
Glucose-Capillary: 115 mg/dL — ABNORMAL HIGH (ref 70–99)

## 2019-12-07 LAB — PHOSPHORUS: Phosphorus: 3.5 mg/dL (ref 2.5–4.6)

## 2019-12-07 LAB — MAGNESIUM: Magnesium: 1.7 mg/dL (ref 1.7–2.4)

## 2019-12-07 MED ORDER — METOPROLOL TARTRATE 5 MG/5ML IV SOLN
2.5000 mg | Freq: Once | INTRAVENOUS | Status: AC
  Administered 2019-12-07: 2.5 mg via INTRAVENOUS
  Filled 2019-12-07: qty 5

## 2019-12-07 MED ORDER — MAGNESIUM SULFATE IN D5W 1-5 GM/100ML-% IV SOLN
1.0000 g | Freq: Once | INTRAVENOUS | Status: AC
  Administered 2019-12-07: 1 g via INTRAVENOUS
  Filled 2019-12-07: qty 100

## 2019-12-07 MED ORDER — TRAVASOL 10 % IV SOLN
INTRAVENOUS | Status: AC
  Filled 2019-12-07: qty 1188

## 2019-12-07 MED ORDER — DEXTROSE-NACL 5-0.9 % IV SOLN: INTRAVENOUS | Status: DC

## 2019-12-07 NOTE — Progress Notes (Signed)
   12/07/19 1328  Assess: MEWS Score  Temp 97.8 F (36.6 C)  BP 130/89  Pulse Rate (!) 143 (put on yellow mews, notified nurse)  Resp 17  SpO2 100 %  O2 Device Room Air  Assess: MEWS Score  MEWS Temp 0  MEWS Systolic 0  MEWS Pulse 3  MEWS RR 0  MEWS LOC 0  MEWS Score 3  MEWS Score Color Yellow  Assess: if the MEWS score is Yellow or Red  Were vital signs taken at a resting state? Yes  Focused Assessment No change from prior assessment  Early Detection of Sepsis Score *See Row Information* Low  MEWS guidelines implemented *See Row Information* Yes  Treat  MEWS Interventions Administered scheduled meds/treatments  Pain Scale 0-10  Pain Score 0  Take Vital Signs  Increase Vital Sign Frequency  Yellow: Q 2hr X 2 then Q 4hr X 2, if remains yellow, continue Q 4hrs  Escalate  MEWS: Escalate Yellow: discuss with charge nurse/RN and consider discussing with provider and RRT  Notify: Charge Nurse/RN  Name of Charge Nurse/RN Notified Kim RN  Date Charge Nurse/RN Notified 12/07/19  Time Charge Nurse/RN Notified 1338  Notify: Provider  Provider Name/Title Florencia Reasons, MD  Date Provider Notified 12/07/19  Time Provider Notified 1343  Notification Type Face-to-face  Notification Reason Other (Comment) (MEWS score )  Response No new orders (give scheduled meds)  Date of Provider Response 12/07/19  Time of Provider Response 20   MD notified of Patients HR (143). Mews score yellow, VS will be obtained q2 x2, then q4 x4. Patient received scheduled IV metoprolol. No new orders received.

## 2019-12-07 NOTE — Progress Notes (Signed)
PHARMACY - TOTAL PARENTERAL NUTRITION CONSULT NOTE   Indication: intolerance to enteral feeding  Patient Measurements: Height: 5\' 7"  (170.2 cm) Weight: 80.2 kg (176 lb 12.9 oz) IBW/kg (Calculated) : 61.6 TPN AdjBW (KG): 66 Body mass index is 27.69 kg/m. Usual Weight:   Assessment: Patient is a 59 y.o. F with metastatic pancreatic cancer on chemotherapy PTA presented to the ED on 10/30 with c/o abdominal pain and n/v. With patient unable to tolerate oral intake, pharmacy is consulted to start TPN for nutrition.  Glucose / Insulin: no hx DM; on sensitive SSI q6h (used 0 unit since TPN rate increased to 65 units/hr on 11/11) - CBGs (goal <150): 106-116 Electrolytes: K down 3.6 (with K content reduced in TPN),CorrCa 10 (Ca removed in TPN), phos down 3.5 (phos reduced in TPN), Mag down 1.7 Renal: scr stable, BUN now wnl LFTs / TGs: AST/ALT down 49/48, Tbili wnl - TG 64 (11/10) Prealbumin / albumin: prealbumin 9 (11/10), albumin 3.1 Intake / Output; MIVF: I/O +1041 - D5NS at 30 ml/hr GI Imaging: - 11/8 abd xray: negative Surgeries / Procedures:  - 11/12: plan for GI series  Central access: port-a cath TPN start date: 12/05/19  Nutritional Goals (per RD recommendation on 11/9): Kcal:  2200-2400 Protein:  115-125g Fluid:  2L/day  Goal TPN rate is 90 mL/hr (provides 119 g of protein and 2372 kcals per day)  Current Nutrition:  - NPO --> clear liquid - start TPN on 11/10  - Of note, patient has peanut allergy (rxns= hives). Will use SMOFlipid  Plan:   Now: - magnesium sulfate 1gm IV x1  - increase TPN to goal rate 90 mL/hr at 1800 - Electrolytes in TPN: 62mEq/L of Na, increase to standard 50 mEq/L of K, cont with no Ca, 15mEq/L of Mg, and increase to 10 mmol/L of Phos. Cl:Ac ratio 1:1 - Add standard MVI and trace elements to TPN - Initiate Sensitive q6h SSI and adjust as needed  - Reduce MIVF to 10 mL/hr at 1800 - CMET, phos and mag on 12/08/19 - Monitor TPN labs on  Mon/Thurs  Sahian Kerney P 12/07/2019,7:11 AM

## 2019-12-07 NOTE — Progress Notes (Addendum)
HEMATOLOGY-ONCOLOGY PROGRESS NOTE  SUBJECTIVE: Reports that she feels better overall.  Has not had any nausea or vomiting yesterday or today.  Reports that she is thirsty and has been able to drink some water and keep it down without any difficulty.  TPN continues.  Denies diarrhea.  No abdominal pain this morning.  Oncology History Overview Note  Cancer Staging Pancreatic cancer [facility]) Staging form: Exocrine Pancreas, AJCC 8th Edition - Clinical stage from 10/18/2019: Stage IV (cT3, cN1, cM1) - Signed by Truitt Merle, MD on 10/18/2019    Pancreatic cancer ([place])  09/19/2019 Imaging   CT AP w contrast 09/19/19  IMPRESSION: 1. Findings are highly concerning for probable primary pancreatic adenocarcinoma in the anterior aspect of the pancreatic head. Several prominent borderline enlarged lymph nodes are noted in the hepatoduodenal nodal station, and there are multiple indeterminate liver lesions which are highly concerning for probable hepatic metastases. Further evaluation with nonemergent abdominal MRI with and without IV gadolinium with MRCP is recommended in the near future to better evaluate these findings.   09/25/2019 Procedure   Upper EUS by Dr Paulita Fujita  IMPRESSION -There was no evidence of significant pathology in the left lobe of the liver.  -A few lymph nodes were visualized and measures in the peripancreatic region and porta hepata region.  -Hyperchoic material consistent with sludge was visualized endosonographically in the gallbladder.  -There was no sign of significant pathology in the common bile duct.  -A mass was identified in the pancreatic head. If biopsy results show adenocarcinoma, it would be staged T3N1Mx by endosonographic criteria. Fine needle aspiration performed.    09/25/2019 Initial Biopsy   FINAL MICROSCOPIC DIAGNOSIS: Fine needle aspirate, Pancreas;  MALIGNANT CELLS PRESENT CONSISTENT WITH ADENOCARCINOMA.    10/02/2019 Initial Diagnosis   Pancreatic cancer  ([place])   10/11/2019 Procedure   PAC placement y Dr Barry Dienes    10/15/2019 Imaging   CT Chest  IMPRESSION: 1. Small anterior left pneumothorax with dependent atelectasis in the left lower lobe. 2. Increased number of bilateral axillary and subpectoral lymph nodes with mild lymphadenopathy in the left axilla. While this would be an atypical presentation for metastatic pancreatic cancer, this possibility is not excluded 3. Main duct dilatation in the pancreas, better assessed on abdomen CT 09/19/2019.   10/16/2019 Imaging   MRI Abdomen  IMPRESSION: 1. Substantially motion degraded scan. 2. Probable persistent small anterior left lung base pneumothorax, better seen on chest CT from 1 day prior. 3. Poorly marginated hypoenhancing 3.7 x 2.9 cm pancreatic head mass, which appears to invade the anterior peripancreatic fat, compatible with known pancreatic adenocarcinoma. Diffuse irregular dilatation of the main pancreatic duct in the pancreatic body and tail. Mild narrowing of the main portal vein by the mass. Abdominal vasculature remains patent and otherwise uninvolved. 4. Numerous (greater than 10) small liver masses scattered throughout the liver, largest 1.0 cm, which appear to demonstrate targetoid enhancement on the limited motion degraded postcontrast sequences, compatible with liver metastases. 5. Mild porta hepatis adenopathy, suspicious for metastatic disease.   10/18/2019 Cancer Staging   Staging form: Exocrine Pancreas, AJCC 8th Edition - Clinical stage from 10/18/2019: Stage IV (cT3, cN1, cM1) - Signed by Truitt Merle, MD on 10/18/2019   10/24/2019 -  Chemotherapy   First-line FOLFIRINOX q2weeks starting 10/24/19   10/30/2019 Pathology Results   FINAL MICROSCOPIC DIAGNOSIS:   A. LIVER, LESION, BIOPSY:  -  Metastatic carcinoma  -  See comment   COMMENT:   By immunohistochemistry, the neoplastic cells are  positive for  cytokeratin 7 and GATA3 with patchy nonspecific staining  for PAX 8 but  negative for TTF-1, CDX2 and cytokeratin 20.  Overall, the findings are  consistent with metastasis of the patient's known breast carcinoma.  Prognostic panel (ER, PR, Her-2) is pending and will be reported in an  addendum.    ADDENDUM:   Dr. Laurence Ferrari notified us (November 01, 2019) that the patient was also  being worked up for a pancreatic mass.  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  In addition, ER and PR  are negative.  Gata-3 can be expressed in the pancreatic  adenocarcinomas; therefore, pancreatobiliary primary remains in the  differential.    11/02/2019 Genetic Testing   Negative genetic testing: no pathogenic variants detected in [facility] Common Hereditary Cancers Panel.  The report date is November 02, 2019.   The Common Hereditary Cancers Panel offered by [facility] includes sequencing and/or deletion duplication testing of the following 48 genes: APC, ATM, AXIN2, BARD1, BMPR1A, BRCA1, BRCA2, BRIP1, CDH1, CDK4, CDKN2A (p14ARF), CDKN2A (p16INK4a), CHEK2, CTNNA1, DICER1, EPCAM (Deletion/duplication testing only), GREM1 (promoter region deletion/duplication testing only), KIT, MEN1, MLH1, MSH2, MSH3, MSH6, MUTYH, NBN, NF1, NHTL1, PALB2, PDGFRA, PMS2, POLD1, POLE, PTEN, RAD50, RAD51C, RAD51D, RNF43, SDHB, SDHC, SDHD, SMAD4, SMARCA4. STK11, TP53, TSC1, TSC2, and VHL.  The following genes were evaluated for sequence changes only: SDHA and HOXB13 c.251G>A variant only.   11/04/2019 Imaging   CT AP  IMPRESSION: 1. Circumferential bowel wall thickening with adjacent fat stranding throughout the colon, most predominant in the transverse colon. This is consistent with colitis, which may be infectious or inflammatory in etiology. 2. Ill-defined pancreatic head mass consistent with known malignancy, similar to mildly increased in comparison to prior CT. There are innumerable hypodense masses throughout the liver, increased in  conspicuity in comparison to prior CT. Findings are worrisome for worsening metastatic disease. 3. Filling defect in the LEFT internal iliac vein, likely a thrombus with differential considerations including mixing artifact.      REVIEW OF SYSTEMS:   Constitutional: Denies fevers, chills  Respiratory: Denies cough, dyspnea or wheezes Cardiovascular: Denies palpitation, chest discomfort Gastrointestinal: Abdominal pain improved.  No nausea or vomiting reported this morning diarrhea resolved. Skin: Denies abnormal skin rashes Lymphatics: Denies new lymphadenopathy or easy bruising Neurological:Denies numbness, tingling or new weaknesses Behavioral/Psych: Mood is stable, no new changes  Extremities: No lower extremity edema All other systems were reviewed with the patient and are negative.  I have reviewed the past medical history, past surgical history, social history and family history with the patient and they are unchanged from previous note.   PHYSICAL EXAMINATION: ECOG PERFORMANCE STATUS: 3  Vitals:   12/07/19 0158 12/07/19 0542  BP: (!) 148/87 139/84  Pulse: (!) 103 (!) 109  Resp: 16 16  Temp: 99.2 F (37.3 C) (!) 97.4 F (36.3 C)  SpO2: 100% 99%   Filed Weights   12/04/19 0534 12/04/19 1354 12/05/19 0540  Weight: 80.4 kg 79.9 kg 80.2 kg    Intake/Output from previous day: 11/11 0701 - 11/12 0700 In: 1041.7 [I.V.:1041.7] Out: -   GENERAL:alert, no distress and comfortable SKIN: skin color, texture, turgor are normal, no rashes or significant lesions LUNGS: clear to auscultation and percussion with normal breathing effort HEART: regular rate & rhythm and no murmurs and no lower extremity edema ABDOMEN: Positive bowel sounds, soft, nontender Musculoskeletal:no cyanosis of digits and no clubbing  NEURO: alert & oriented x 3  with fluent speech, no focal motor/sensory deficits  LABORATORY DATA:  I have reviewed the data as listed CMP Latest Ref Rng & Units  12/07/2019 12/06/2019 12/05/2019  Glucose 70 - 99 mg/dL 116(H) 98 105(H)  BUN 6 - 20 mg/dL 12 5(L) <5(L)  Creatinine 0.44 - 1.00 mg/dL 0.53 0.54 0.51  Sodium 135 - 145 mmol/L 138 138 137  Potassium 3.5 - 5.1 mmol/L 3.6 4.2 3.2(L)  Chloride 98 - 111 mmol/L 106 105 105  CO2 22 - 32 mmol/L '24 26 25  ' Calcium 8.9 - 10.3 mg/dL 9.3 9.6 9.0  Total Protein 6.5 - 8.1 g/dL 6.5 6.9 6.4(L)  Total Bilirubin 0.3 - 1.2 mg/dL 0.7 0.6 0.6  Alkaline Phos 38 - 126 U/L 130(H) 118 107  AST 15 - 41 U/L 49(H) 51(H) 56(H)  ALT 0 - 44 U/L 48(H) 49(H) 49(H)    Lab Results  Component Value Date   WBC 3.2 (L) 12/05/2019   HGB 9.1 (L) 12/05/2019   HCT 27.6 (L) 12/05/2019   MCV 92.6 12/05/2019   PLT 169 12/05/2019   NEUTROABS 1.7 12/05/2019    DG Chest 2 View  Result Date: 11/24/2019 CLINICAL DATA:  Nausea and vomiting. EXAM: CHEST - 2 VIEW COMPARISON:  November 04, 2019 FINDINGS: There is stable left-sided venous Port-A-Cath positioning. The heart size and mediastinal contours are within normal limits. Both lungs are clear. The visualized skeletal structures are unremarkable. IMPRESSION: No active cardiopulmonary disease. Electronically Signed   By: Virgina Norfolk M.D.   On: 11/24/2019 18:52   DG Abd 1 View  Result Date: 12/03/2019 CLINICAL DATA:  Diffuse abdominal pain EXAM: ABDOMEN - 1 VIEW COMPARISON:  11/24/2019 FINDINGS: The bowel gas pattern is normal. No radio-opaque calculi or other significant radiographic abnormality are seen. IMPRESSION: Negative. Electronically Signed   By: Inez Catalina M.D.   On: 12/03/2019 08:21   CT Abdomen Pelvis W Contrast  Result Date: 11/24/2019 CLINICAL DATA:  Abdominal distension, pain, nausea, vomiting. Pancreatic cancer EXAM: CT ABDOMEN AND PELVIS WITH CONTRAST TECHNIQUE: Multidetector CT imaging of the abdomen and pelvis was performed using the standard protocol following bolus administration of intravenous contrast. CONTRAST:  14m OMNIPAQUE IOHEXOL 300 MG/ML   SOLN COMPARISON:  11/04/2019 FINDINGS: Lower chest: Scarring at the left lung base.  No acute abnormality. Hepatobiliary: Numerous lesions throughout the liver, enlarging since prior study. Index right hepatic dome lesion measures 2.1 cm compared to 1.6 cm previously. Inferior right hepatic lesion measures 1.4 cm compared with 9 mm previously. Gallbladder unremarkable. Pancreas: Pancreatic head mass measuring up to 3.4 cm compared to 3.2 cm previously, likely not significantly changed. Pancreatic ductal dilatation in the body and tail. Spleen: No focal abnormality.  Normal size. Adrenals/Urinary Tract: No adrenal abnormality. No focal renal abnormality. No stones or hydronephrosis. Urinary bladder is unremarkable. Stomach/Bowel: Stomach, large and small bowel grossly unremarkable. Normal appendix. Vascular/Lymphatic: No evidence of aneurysm or adenopathy. Reproductive: No mass or abnormality noted. Other: No free fluid or free air. Musculoskeletal: No acute bony abnormality. IMPRESSION: Pancreatic head mass again noted compatible with patient's given history of pancreatic cancer. Numerous metastases throughout the liver, enlarging since prior study. Electronically Signed   By: KRolm BaptiseM.D.   On: 11/24/2019 17:02   DG UGI W SINGLE CM (SOL OR THIN BA)  Result Date: 12/06/2019 CLINICAL DATA:  Nausea and vomiting. Additional history obtained [place]. EXAM: UPPER GI SERIES WITH KUB TECHNIQUE: After obtaining a scout radiograph a  routine upper GI series was performed using thin and high density barium. FLUOROSCOPY TIME:  Fluoroscopy Time:  4 minutes, 24 seconds. Radiation Exposure Index (if provided by the fluoroscopic device): 80.90 mGy Number of Acquired Spot Images: 5 COMPARISON:  CT abdomen/pelvis 11/24/2019. FINDINGS: A scout radiograph of the abdomen demonstrates a nonobstructive bowel gas pattern. Fluoroscopic evaluation demonstrates normal caliber and  smooth contour of the esophagus. No evidence of fixed stricture, mass or mucosal abnormality. Mild nonspecific intermittent esophageal dysmotility. No hiatal hernia. No gastroesophageal reflux was observed. The patient swallowed a 13 mm barium tablet, which remained in the distal esophagus despite a prolonged period of observation. Normal appearance of the stomach, duodenal bulb and duodenal sweep. No evidence of ulceration, fold thickening or mass. IMPRESSION: A swallowed 13 mm barium tablet remained in the distal esophagus despite a prolonged period of observation. However, there is no appreciable distal esophageal stricture. Mild nonspecific esophageal dysmotility. Otherwise unremarkable upper GI series, as described. Electronically Signed   By: Kellie Simmering DO   On: 12/06/2019 13:54    ASSESSMENT AND PLAN: 1.  Metastatic pancreatic adenocarcinoma 2.  Intractable nausea and vomiting secondary to chemotherapy and cancer, persistent and refractory  3.  Dehydration, improved 4.  Anemia secondary to chemotherapy 5.  Left iliac vein DVT 6.  Lupus 7.  Hypertension 8.  Migraines 9.  Anxiety 10 persistent hypokalemia, resolved  Plan -Patient is on maximum antiemetics and starting to see some improvement.  -She was started on Carafate yesterday by GI.  Upper GI series performed yesterday showed no esophageal stricture, mild nonspecific esophageal dysmotility but otherwise normal test.  GI following. -Currently on TPN for nutrition.  She is trying to take in some clear liquids as well.  Mikey Bussing  12/07/2019   Addendum  I have seen the patient, examined her. I agree with the assessment and and plan and have edited the notes.   Addendum  I have seen the patient, examined her. I agree with the assessment and and plan and have edited the notes.   Pt is overall slightly better, was able to tolerate oral medicine today, on clear liquid, energy is better with TPN, out of bed more now. I  reviewed her lab results and recent EGD findings, appreciate GI input.  Patient wants to wait and see how she tolerates oral intake this weekend, then decide if proceed with EGD on Monday.continue supportive care.   Truitt Merle  12/07/2019

## 2019-12-07 NOTE — Progress Notes (Signed)
[facility] Progress Note  Cynthia Frye 59 y.o. 1960/07/23  CC: Nausea, vomiting, poor oral intake   Subjective: Patient seen and examined at bedside.  She is feeling better in last 2 days.  Had some nausea but denies any vomiting since yesterday.  Able to tolerate clear liquid diet.  Upper GI series findings discussed with the patient.  ROS : Negative for chest pain and shortness of breath.   Objective: Vital signs in last 24 hours: Vitals:   12/07/19 0158 12/07/19 0542  BP: (!) 148/87 139/84  Pulse: (!) 103 (!) 109  Resp: 16 16  Temp: 99.2 F (37.3 C) (!) 97.4 F (36.3 C)  SpO2: 100% 99%    Physical Exam:  General:  Alert, cooperative, no distress, appears stated age  Head:  Normocephalic, without obvious abnormality, atraumatic  Eyes:  , EOM's intact,   Lungs:    No visible respiratory distress  Heart:  Regular rate and rhythm, S1, S2 normal  Abdomen:    Generalized abdominal discomfort on palpation, bowel sounds present, abdomen is mildly distended, no peritoneal signs.  Extremities: Extremities normal, atraumatic, no  edema       Lab Results: Recent Labs    12/06/19 0833 12/07/19 0517  NA 138 138  K 4.2 3.6  CL 105 106  CO2 26 24  GLUCOSE 98 116*  BUN 5* 12  CREATININE 0.54 0.53  CALCIUM 9.6 9.3  MG 1.9 1.7  PHOS 4.3 3.5   Recent Labs    12/06/19 0833 12/07/19 0517  AST 51* 49*  ALT 49* 48*  ALKPHOS 118 130*  BILITOT 0.6 0.7  PROT 6.9 6.5  ALBUMIN 3.1* 3.1*   Recent Labs    12/05/19 0615  WBC 3.2*  NEUTROABS 1.7  HGB 9.1*  HCT 27.6*  MCV 92.6  PLT 169   No results for input(s): LABPROT, INR in the last 72 hours.    Assessment/Plan: -Persistent nausea and vomiting in patient with metastatic pancreatic cancer. She denies any associated dysphagia or odynophagia. Symptoms are improving. Currently on TPN.  Recommendations ------------------------- -Upper GI series yesterday showed nonspecific esophageal dysmotility and  13 mm pill remain in the distal esophagus without any evidence of stricture or narrowing.  No evidence of gastric outlet obstruction.  -Patient symptoms are improving.  Offered EGD with possible balloon dilation of the lower esophagus for tomorrow but patient declined.  Discussed with patient's husband over the phone.  They would like to advance diet to full liquid and see how she does and decide for EGD probably on Monday if no improvement in symptoms.  -GI will follow.  Advance diet to full liquid.  Continue other supportive care.   Otis Brace MD, [place] 12/07/2019, 10:55 AM  Contact #  [phone]

## 2019-12-07 NOTE — Progress Notes (Signed)
PROGRESS NOTE    Cynthia Frye  WVP:[id number] DOB: 01/16/1961 DOA: 11/24/2019 PCP: Josetta Huddle, MD    Chief Complaint  Patient presents with  . Emesis    Brief Narrative:   58 year old unfortunate female history of recently diagnosed metastatic pancreatic adenocarcinoma with mets to the liver 09/19/2019, currently undergoing chemotherapy, recent chemotherapy 11/21/2019 presenting with 1 day history of worsening nausea, vomiting, abdominal pain. Noted to have significant hypokalemia with inability to keep anything down. CT abdomen and pelvis negative for small bowel obstruction. Urinalysis concerning for UTI. Patient started on IV antibiotics, scheduled IV antiemetics, IV fluids, potassium repletion  Subjective:   feeling better this morning, she declined egd tomorrow, wants to try full liquid before making decision on EGD Tolerating TPN Stomach pain 2/10, no n/v this am, last vomited two days ago No fever  Assessment & Plan:   Principal Problem:   Hypokalemia Active Problems:   Essential hypertension   Lupus (systemic lupus erythematosus) (HCC)   Seasonal allergies   Chronic migraine without aura without status migrainosus, not intractable   Pancreatic cancer (HCC)   Acute deep vein thrombosis of left iliac vein (HCC)   Nausea and vomiting in adult   Dehydration   Prolonged QT interval   Emesis   Tachycardia   Anemia associated with chemotherapy   Constipation   Generalized abdominal pain   Malnutrition of moderate degree  Intractable nausea vomiting, dehydration On around-the-clock as needed Zofran, Compazine  nightly Zyprexa TPN started due to poor oral intake Continue PPI GI input appreciated, upper GI series "A swallowed 13 mm barium tablet remained in the distal esophagus despite a prolonged period of observation. However, there is no appreciable distal esophageal stricture. Mild nonspecific esophageal dysmotility" -GI offered EGD with possible  dilation, patient wants to wait until Monday to decide    Hypokalemia hypomagnesemia Replace prn to keep K above 4, mag above 2  QTC prolongation Resolved, monitor QTC every morning, now normalized  Possible UTI -Completed 3 days of IV Rocephin  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 Burr Medico following  Normocytic anemia in the setting of malignancy Hemoglobin stable above 9  Recent left leg DVT Currently on full dose Lovenox  History of lupus Follows rheumatologist Dr. Lenna Gilford, on weekly methotrexate injection Plaquenil diclofenac  Hypertension On IV Lopressor due to inconsistent oral intake  DVT prophylaxis: Full dose Lovenox   Code Status: Full Family Communication: Patient Disposition:   Status is: Inpatient  Dispo: The patient is from: Home              Anticipated d/c is to: Home              Anticipated d/c date is: To be determined                Consultants:   Oncology Dr Burr Medico  GI  Palliative care  Procedures:   None  Antimicrobials:   As above     Objective: Vitals:   12/06/19 1725 12/06/19 2205 12/07/19 0158 12/07/19 0542  BP: (!) 132/94 (!) 151/86 (!) 148/87 139/84  Pulse: (!) 110 96 (!) 103 (!) 109  Resp: 15 16 16 16   Temp: 99.2 F (37.3 C) 99.5 F (37.5 C) 99.2 F (37.3 C) (!) 97.4 F (36.3 C)  TempSrc: Oral Oral Oral Oral  SpO2: 100% 100%  100% 99%  Weight:      Height:        Intake/Output Summary (Last 24 hours) at 12/07/2019 1139 Last data filed at 12/07/2019 0429 Gross per 24 hour  Intake 1041.68 ml  Output --  Net 1041.68 ml   Filed Weights   12/04/19 0534 12/04/19 1354 12/05/19 0540  Weight: 80.4 kg 79.9 kg 80.2 kg    Examination:  General exam: calm, NAD Respiratory system: Clear to auscultation. Respiratory effort  normal. Cardiovascular system: S1 & S2 heard, RRR. No JVD, no murmur, No pedal edema. Gastrointestinal system: Abdomen is nondistended, soft and nontender.  Normal bowel sounds heard. Central nervous system: Alert and oriented. No focal neurological deficits. Extremities: Symmetric 5 x 5 power. Skin: No rashes, lesions or ulcers Psychiatry: Judgement and insight appear normal. Mood & affect appropriate.     Data Reviewed: I have personally reviewed following labs and imaging studies  CBC: Recent Labs  Lab 12/01/19 0639 12/02/19 0622 12/03/19 0514 12/04/19 0544 12/05/19 0615  WBC 3.5* 3.6* 3.8* 3.7* 3.2*  NEUTROABS  --   --   --   --  1.7  HGB 9.3* 9.2* 9.3* 9.2* 9.1*  HCT 28.3* 27.8* 27.9* 28.5* 27.6*  MCV 90.4 91.1 90.6 92.2 92.6  PLT 179 167 165 162 841    Basic Metabolic Panel: Recent Labs  Lab 12/03/19 0514 12/04/19 0544 12/05/19 0615 12/06/19 0833 12/07/19 0517  NA 137 136 137 138 138  K 3.2* 2.9* 3.2* 4.2 3.6  CL 106 105 105 105 106  CO2 26 25 25 26 24   GLUCOSE 110* 115* 105* 98 116*  BUN <5* <5* <5* 5* 12  CREATININE 0.57 0.56 0.51 0.54 0.53  CALCIUM 8.8* 9.1 9.0 9.6 9.3  MG 2.0 1.8 2.0 1.9 1.7  PHOS  --   --  3.4 4.3 3.5    GFR: Estimated Creatinine Clearance: 83.5 mL/min (by C-G formula based on SCr of 0.53 mg/dL).  Liver Function Tests: Recent Labs  Lab 12/03/19 0514 12/04/19 0544 12/05/19 0615 12/06/19 0833 12/07/19 0517  AST 59* 62* 56* 51* 49*  ALT 44 50* 49* 49* 48*  ALKPHOS 98 103 107 118 130*  BILITOT 0.7 0.7 0.6 0.6 0.7  PROT 6.2* 6.3* 6.4* 6.9 6.5  ALBUMIN 2.9* 3.0* 3.0* 3.1* 3.1*    CBG: Recent Labs  Lab 12/06/19 0628 12/06/19 1144 12/06/19 1718 12/07/19 0043 12/07/19 0611  GLUCAP 111* 105* 106* 108* 115*     No results found for this or any previous visit (from the past 240 hour(s)).       Radiology Studies: DG UGI W SINGLE CM (SOL OR THIN BA)  Result Date: 12/06/2019 CLINICAL DATA:  Nausea and vomiting.  Additional history obtained from [place] NUMBERHistory of pancreatic cancer. EXAM: UPPER GI SERIES WITH KUB TECHNIQUE: After obtaining a scout radiograph a routine upper GI series was performed using thin and high density barium. FLUOROSCOPY TIME:  Fluoroscopy Time:  4 minutes, 24 seconds. Radiation Exposure Index (if provided by the fluoroscopic device): 80.90 mGy Number of Acquired Spot Images: 5 COMPARISON:  CT abdomen/pelvis 11/24/2019. FINDINGS: A scout radiograph of the abdomen demonstrates a nonobstructive bowel gas pattern. Fluoroscopic evaluation demonstrates normal caliber and smooth contour of the esophagus. No evidence of fixed stricture, mass or mucosal abnormality. Mild nonspecific intermittent esophageal dysmotility. No hiatal hernia. No gastroesophageal reflux was observed. The patient swallowed a 13 mm barium tablet, which remained in the distal esophagus despite a prolonged period of  observation. Normal appearance of the stomach, duodenal bulb and duodenal sweep. No evidence of ulceration, fold thickening or mass. IMPRESSION: A swallowed 13 mm barium tablet remained in the distal esophagus despite a prolonged period of observation. However, there is no appreciable distal esophageal stricture. Mild nonspecific esophageal dysmotility. Otherwise unremarkable upper GI series, as described. Electronically Signed   By: Kellie Simmering DO   On: 12/06/2019 13:54        Scheduled Meds: . Chlorhexidine Gluconate Cloth  6 each Topical Daily  . enoxaparin  120 mg Subcutaneous Q24H  . fluticasone  2 spray Each Nare Daily  . insulin aspart  0-9 Units Subcutaneous Q6H  . lipase/protease/amylase  24,000 Units Oral TID WC  . metoprolol tartrate  5 mg Intravenous Q8H  . OLANZapine  7.5 mg Oral QHS  . pantoprazole  40 mg Oral Q1200  . polyethylene glycol  17 g Oral Daily  . senna-docusate  1 tablet Oral BID  . sodium chloride flush  10-40 mL Intracatheter Q12H  . sodium chloride flush  3  mL Intravenous Q12H  . sucralfate  1 g Oral TID WC & HS  . topiramate  50 mg Oral QHS   Continuous Infusions: . dextrose 5 % and 0.9% NaCl 30 mL/hr at 12/07/19 0600  . dextrose 5 % and 0.9% NaCl    . TPN ADULT (ION) 65 mL/hr at 12/07/19 0429  . TPN ADULT (ION)       LOS: 12 days   Time spent: 48mins Greater than 50% of this time was spent in counseling, explanation of diagnosis, planning of further management, and coordination of care.  I have personally reviewed and interpreted on  12/07/2019 daily labs, tele strips, I reviewed all nursing notes, pharmacy notes, consultant notes,  vitals, pertinent old records  I have discussed plan of care as described above with RN , patient  on 12/07/2019  Voice Recognition /Dragon dictation system was used to create this note, attempts have been made to correct errors. Please contact the author with questions and/or clarifications.   Florencia Reasons, MD PhD FACP [facility]  Available via Epic secure chat 7am-7pm for nonurgent issues Please page for urgent issues To page the attending provider between 7A-7P or the covering provider during after hours 7P-7A, please log into the web site [email or web address] and access using universal [place] password for that web site. If you do not have the password, please call the hospital operator.    12/07/2019, 11:39 AM

## 2019-12-07 NOTE — Consult Note (Signed)
   [facility] CM Inpatient Consult   12/07/2019  Cynthia Frye 19-Aug-1960 [id number]   [place] [facility] [ACO] Patient:  Medicare NextGen   Patient screened for extreme high risk score for unplanned readmission risk of 31% noted, length of stay, and for less than 30 days readmission hospitalization/  Reviewed to check for potential [place] [facility] post hospital transition of care service needs.    Plan:  Continue to follow progress and disposition to assess for post hospital care management needs.    Please place a [facility] Care Management consult as appropriate and for questions contact:   Natividad Brood, RN BSN [facility] Liaison  [phone] business mobile phone Toll free office [phone]  Fax number: [phone] [email or web address]

## 2019-12-07 NOTE — Progress Notes (Signed)
Daily Progress Note   Patient Name: Cynthia Frye       Date: 12/07/2019 DOB: 1960/08/24  Age: 59 y.o. MRN#: [id number] Attending Physician: Florencia Reasons, MD Primary Care Physician: Josetta Huddle, MD Admit Date: 11/24/2019  Reason for Consultation/Follow-up: Non pain symptom management  Subjective: I saw and examined Cynthia Frye today.  She was sitting in bed, vomiting is better controlled, she is looking forward to trying some soda, she is now on TPN.    Length of Stay: 12  Current Medications: Scheduled Meds:  . Chlorhexidine Gluconate Cloth  6 each Topical Daily  . enoxaparin  120 mg Subcutaneous Q24H  . fluticasone  2 spray Each Nare Daily  . insulin aspart  0-9 Units Subcutaneous Q6H  . lipase/protease/amylase  24,000 Units Oral TID WC  . metoprolol tartrate  5 mg Intravenous Q8H  . OLANZapine  7.5 mg Oral QHS  . pantoprazole  40 mg Oral Q1200  . polyethylene glycol  17 g Oral Daily  . senna-docusate  1 tablet Oral BID  . sodium chloride flush  10-40 mL Intracatheter Q12H  . sodium chloride flush  3 mL Intravenous Q12H  . sucralfate  1 g Oral TID WC & HS  . topiramate  50 mg Oral QHS    Continuous Infusions: . dextrose 5 % and 0.9% NaCl 30 mL/hr at 12/07/19 0600  . dextrose 5 % and 0.9% NaCl    . TPN ADULT (ION) 65 mL/hr at 12/07/19 0429  . TPN ADULT (ION)      PRN Meds: acetaminophen **OR** acetaminophen, hydrALAZINE, HYDROmorphone (DILAUDID) injection, hydrOXYzine, levalbuterol, loperamide, LORazepam, metoCLOPramide (REGLAN) injection, ondansetron (ZOFRAN) IV, oxyCODONE, prochlorperazine, sodium chloride flush  Physical Exam         General: Alert, awake, in no acute distress.  Heart: Regular rate and rhythm. No murmur appreciated. Lungs: Good air movement,  clear Abdomen: Soft, nontender, nondistended, positive bowel sounds.  Ext: No significant edema Skin: Warm and dry Neuro: Grossly intact, nonfocal.   Vital Signs: BP 139/84 (BP Location: Left Leg)   Pulse (!) 109   Temp (!) 97.4 F (36.3 C) (Oral)   Resp 16   Ht 5\' 7"  (1.702 m)   Wt 80.2 kg   SpO2 99%   BMI 27.69 kg/m  SpO2: SpO2: 99 % O2 Device: O2  Device: Room Air O2 Flow Rate:    Intake/output summary:   Intake/Output Summary (Last 24 hours) at 12/07/2019 1320 Last data filed at 12/07/2019 0429 Gross per 24 hour  Intake 1041.68 ml  Output --  Net 1041.68 ml   LBM: Last BM Date: 12/05/19 Baseline Weight: Weight: 79.4 kg Most recent weight: Weight: 80.2 kg       Palliative Assessment/Data:      Patient Active Problem List   Diagnosis Date Noted  . Malnutrition of moderate degree 11/26/2019  . 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 Constipation 11/25/2019  . Generalized abdominal pain   . Nausea and vomiting in adult 11/24/2019  . Dehydration 11/24/2019  . Prolonged QT interval 11/24/2019  . Emesis   . Tachycardia   . Genetic testing 11/12/2019  . Colitis 11/04/2019  . Acute deep vein thrombosis of left iliac vein (HCC) 11/04/2019  . Hypokalemia 11/04/2019  . Port-A-Cath in place 10/24/2019  . Goals of care, counseling/discussion 10/18/2019  . Pancreatic cancer ([place]) 10/02/2019  . Chronic migraine without aura without status migrainosus, not intractable 04/23/2019  . Migraine with aura and without status migrainosus, not intractable 04/23/2019  . Essential hypertension 04/29/2016  . Lupus (systemic lupus erythematosus) ([place]) 04/29/2016  . Asthma 04/29/2016  . Seasonal allergies 04/29/2016  . Abnormal stress test 04/09/2016    Palliative Care Assessment & Plan   Patient Profile: Cynthia Frye is a 59 year old female with past medical history of recently diagnosed metastatic pancreatic cancer who recently underwent second round of  chemotherapy and was admitted with worsening nausea, vomiting, and abdominal pain.  She has had significant African hypokalemia and hypomagnesemia.  CT of the abdomen on exam was negative for bowel obstruction.  Urinalysis was concerning for UTI.  Of note, she had several day admission following first round of chemo with colitis and also suffering from symptoms of nausea and vomiting that was uncontrolled.  Palliative consulted for refractory nausea and vomiting. Assessment: Patient Active Problem List   Diagnosis Date Noted  . Malnutrition of moderate degree 11/26/2019  . 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 Constipation 11/25/2019  . Generalized abdominal pain   . Nausea and vomiting in adult 11/24/2019  . Dehydration 11/24/2019  . Prolonged QT interval 11/24/2019  . Emesis   . Tachycardia   . Genetic testing 11/12/2019  . Colitis 11/04/2019  . Acute deep vein thrombosis of left iliac vein (HCC) 11/04/2019  . Hypokalemia 11/04/2019  . Port-A-Cath in place 10/24/2019  . Goals of care, counseling/discussion 10/18/2019  . Pancreatic cancer ([place]) 10/02/2019  . Chronic migraine without aura without status migrainosus, not intractable 04/23/2019  . Migraine with aura and without status migrainosus, not intractable 04/23/2019  . Essential hypertension 04/29/2016  . Lupus (systemic lupus erythematosus) ([place]) 04/29/2016  . Asthma 04/29/2016  . Seasonal allergies 04/29/2016  . Abnormal stress test 04/09/2016     Recommendations/Plan: Nausea vomiting: continue Zyprexa today, also on Zofran and Compazine, GI also following.   Goals of Care and Additional Recommendations:  Limitations on Scope of Treatment: Full Scope Treatment  Code Status:    Code Status Orders  (From admission, onward)         Start     Ordered   11/24/19 1909  Full code  Continuous        11/24/19 1908        Code Status History    Date Active Date Inactive Code Status Order ID Comments User  Context   11/04/2019  2125 11/09/2019 2207 Full Code [id number]  Rise Patience, MD ED   04/09/2016 1319 04/09/2016 1952 Full Code [id number]  Nelva Bush, MD Inpatient   Advance Care Planning Activity       Prognosis:   Unable to determine  Discharge Planning:  To Be Determined  Care plan was discussed with patient  and interdisciplinary care team  Thank you for allowing the Palliative Medicine Team to assist in the care of this patient.   Time In: 1300 Time Out: 1325  Total Time 25 Prolonged Time Billed No      Greater than 50%  of this time was spent counseling and coordinating care related to the above assessment and plan.  Loistine Chance, MD  Please contact Palliative Medicine Team phone at [phone] for questions and concerns.

## 2019-12-07 NOTE — Progress Notes (Signed)
Occupational Therapy Treatment Patient Details Name: Cynthia Frye MRN: [id number] DOB: May 31, 1960 Today's Date: 12/07/2019    History of present illness 59 year old unfortunate female history of recently diagnosed metastatic pancreatic adenocarcinoma with mets to the liver 09/19/2019, currently undergoing chemotherapy, recent chemotherapy 11/21/2019 presenting with 1 day history of worsening nausea, vomiting, abdominal pain.  Noted to have significant hypokalemia, admitted to [facility]   OT comments  Provided patient HEP and orange theraband in order to maximize UE strength and overall activity tolerance needed for independence with daily routine. Patient perform x5 reps of each exercise to demonstrate understanding with min cues for technique. Deferred further reps d/t HR in 140s seated in chair, RN aware.    Follow Up Recommendations  No OT follow up;Supervision - Intermittent    Equipment Recommendations  Tub/shower seat       Precautions / Restrictions Precautions Precautions: Other (comment) Precaution Comments: CHEMO precautions, monitor HR       Mobility Bed Mobility               General bed mobility comments: in chair                        Cognition Arousal/Alertness: Awake/alert Behavior During Therapy: WFL for tasks assessed/performed Overall Cognitive Status: Within Functional Limits for tasks assessed                                          Exercises Exercises: Other exercises Other Exercises Other Exercises: provided patient HEP for UB strengthening with orange theraband. Instructed in elbow flexion, elbow extension, shoulder diagonal pulls, shoulder flexion, shoulder horizontal abduction, shoulder abduction and shoulder forward press. Patient performed x5 reps to demonstrate understanding with min cues for technique. limited patient to 5 reps each at this time due to hr in 140s at rest in chair.  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 OT Goal Formulation: With patient Time For Goal Achievement: 12/21/19 ADL Goals Pt Will Perform Lower Body Bathing: Independently Pt Will Perform Lower Body Dressing: Independently Pt Will Transfer to Toilet: Independently Pt Will Perform Toileting - Clothing Manipulation and hygiene: Independently Pt/caregiver will Perform Home Exercise Program: Right Upper extremity;Left upper extremity;Increased strength;With written HEP provided;Independently;With theraband  Plan Discharge plan remains appropriate       AM-PAC OT "6 Clicks" Daily Activity     Outcome Measure   Help from another person eating meals?: None Help from another person taking care of personal grooming?: None Help from another person toileting, which includes using toliet, bedpan, or urinal?: None Help from another person bathing (including washing, rinsing, drying)?: None Help from another person to put on and taking  off regular upper body clothing?: None Help from another person to put on and taking off regular lower body clothing?: None 6 Click Score: 24    End of Session  OT Visit Diagnosis: Muscle weakness (generalized) (M62.81)   Activity Tolerance Patient tolerated treatment well   Patient Left in chair;with call bell/phone within reach           Time: 1322-1340 OT Time Calculation (min): 18 min  Charges: OT General  Charges $OT Visit: 1 Visit OT Treatments $Therapeutic Exercise: 8-22 mins  Delbert Phenix OT OT pager: [phone]   Rosemary Holms 12/07/2019, 2:34 PM

## 2019-12-08 DIAGNOSIS — Z515 Encounter for palliative care: Secondary | ICD-10-CM | POA: Diagnosis not present

## 2019-12-08 DIAGNOSIS — Z7189 Other specified counseling: Secondary | ICD-10-CM | POA: Diagnosis not present

## 2019-12-08 DIAGNOSIS — E876 Hypokalemia: Secondary | ICD-10-CM | POA: Diagnosis not present

## 2019-12-08 LAB — GLUCOSE, CAPILLARY
Glucose-Capillary: 100 mg/dL — ABNORMAL HIGH (ref 70–99)
Glucose-Capillary: 104 mg/dL — ABNORMAL HIGH (ref 70–99)
Glucose-Capillary: 114 mg/dL — ABNORMAL HIGH (ref 70–99)
Glucose-Capillary: 116 mg/dL — ABNORMAL HIGH (ref 70–99)
Glucose-Capillary: 98 mg/dL (ref 70–99)

## 2019-12-08 LAB — COMPREHENSIVE METABOLIC PANEL
ALT: 43 U/L (ref 0–44)
AST: 43 U/L — ABNORMAL HIGH (ref 15–41)
Albumin: 3 g/dL — ABNORMAL LOW (ref 3.5–5.0)
Alkaline Phosphatase: 118 U/L (ref 38–126)
Anion gap: 9 (ref 5–15)
BUN: 17 mg/dL (ref 6–20)
CO2: 21 mmol/L — ABNORMAL LOW (ref 22–32)
Calcium: 8.9 mg/dL (ref 8.9–10.3)
Chloride: 105 mmol/L (ref 98–111)
Creatinine, Ser: 0.58 mg/dL (ref 0.44–1.00)
GFR, Estimated: >60 mL/min (ref >60)
Glucose, Bld: 113 mg/dL — ABNORMAL HIGH (ref 70–99)
Potassium: 3.7 mmol/L (ref 3.5–5.1)
Sodium: 135 mmol/L (ref 135–145)
Total Bilirubin: 0.5 mg/dL (ref 0.3–1.2)
Total Protein: 6.6 g/dL (ref 6.5–8.1)

## 2019-12-08 LAB — PHOSPHORUS: Phosphorus: 2.6 mg/dL (ref 2.5–4.6)

## 2019-12-08 LAB — MAGNESIUM: Magnesium: 1.9 mg/dL (ref 1.7–2.4)

## 2019-12-08 MED ORDER — METOPROLOL TARTRATE 5 MG/5ML IV SOLN
2.5000 mg | INTRAVENOUS | Status: DC | PRN
  Administered 2019-12-08 – 2019-12-10 (×3): 2.5 mg via INTRAVENOUS
  Filled 2019-12-08 (×2): qty 5

## 2019-12-08 MED ORDER — ENOXAPARIN SODIUM 120 MG/0.8ML ~~LOC~~ SOLN
120.0000 mg | SUBCUTANEOUS | Status: AC
  Administered 2019-12-08: 120 mg via SUBCUTANEOUS
  Filled 2019-12-08: qty 0.8

## 2019-12-08 MED ORDER — METOPROLOL TARTRATE 5 MG/5ML IV SOLN
5.0000 mg | Freq: Four times a day (QID) | INTRAVENOUS | Status: DC
  Administered 2019-12-08 – 2019-12-11 (×11): 5 mg via INTRAVENOUS
  Filled 2019-12-08 (×13): qty 5

## 2019-12-08 MED ORDER — TRAVASOL 10 % IV SOLN
INTRAVENOUS | Status: AC
  Filled 2019-12-08: qty 1188

## 2019-12-08 NOTE — Progress Notes (Signed)
   12/08/19 2046  Assess: MEWS Score  Temp 99.3 F (37.4 C)  BP 133/88  Pulse Rate (!) 123  Resp 16  Level of Consciousness Alert  SpO2 100 %  O2 Device Room Air  Patient Activity (if Appropriate) In bed  Assess: MEWS Score  MEWS Temp 0  MEWS Systolic 0  MEWS Pulse 2  MEWS RR 0  MEWS LOC 0  MEWS Score 2  MEWS Score Color Yellow  Assess: if the MEWS score is Yellow or Red  Were vital signs taken at a resting state? Yes  Focused Assessment No change from prior assessment  Early Detection of Sepsis Score *See Row Information* Low  MEWS guidelines implemented *See Row Information* No, previously yellow, continue vital signs every 4 hours  Treat  MEWS Interventions Other (Comment) (MD was notified by previous RN)  Take Vital Signs  Increase Vital Sign Frequency  Yellow: Q 2hr X 2 then Q 4hr X 2, if remains yellow, continue Q 4hrs  Escalate  MEWS: Escalate Yellow: discuss with charge nurse/RN and consider discussing with provider and RRT  Notify: Charge Nurse/RN  Name of Charge Nurse/RN Notified [place], RN  Date Charge Nurse/RN Notified 12/08/19  Time Charge Nurse/RN Notified 1900 (made aware during shift chg patient has been in the yellow. )  Notify: Provider  Provider Name/Title  (Previous RN notified Dr. Annamaria Boots)  Date Provider Notified 12/08/19  Notify: Rapid Response  Name of Rapid Response RN Notified  (n/a; not necessary at this time. )  Document  Patient Outcome Other (Comment) (stable except for HR and pt is being monitored on tele)  Progress note created (see row info) Yes

## 2019-12-08 NOTE — Progress Notes (Signed)
PROGRESS NOTE    Cynthia Frye  NLG:[id number] DOB: [birth year] DOA: 11/24/2019 PCP: Josetta Huddle, MD    Chief Complaint  Patient presents with  . Emesis    Brief Narrative:   59 year old unfortunate female history of recently diagnosed metastatic pancreatic adenocarcinoma with mets to the liver 09/19/2019, currently undergoing chemotherapy, recent chemotherapy 11/21/2019 presenting with 1 day history of worsening nausea, vomiting, abdominal pain. Noted to have significant hypokalemia with inability to keep anything down. CT abdomen and pelvis negative for small bowel obstruction. Urinalysis concerning for UTI. Patient started on IV antibiotics, scheduled IV antiemetics, IV fluids, potassium repletion  Subjective:   remained on full liquid , she is sitting up in chair, currently no nausea no vomiting ,Tolerating TPN  No fever  Assessment & Plan:   Principal Problem:   Hypokalemia Active Problems:   Essential hypertension   Lupus (systemic lupus erythematosus) (HCC)   Seasonal allergies   Chronic migraine without aura without status migrainosus, not intractable   Pancreatic cancer (HCC)   Acute deep vein thrombosis of left iliac vein (HCC)   Nausea and vomiting in adult   Dehydration   Prolonged QT interval   Emesis   Tachycardia   Anemia associated with chemotherapy   Constipation   Generalized abdominal pain   Malnutrition of moderate degree  Intractable nausea vomiting, dehydration On around-the-clock as needed Zofran, Compazine  nightly Zyprexa TPN started due to poor oral intake Continue PPI GI input appreciated, upper GI series "A swallowed 13 mm barium tablet remained in the distal esophagus despite a prolonged period of observation. However, there is no appreciable distal esophageal stricture. Mild nonspecific esophageal dysmotility" -plan for EGD with possible dilation on Monday, will follow GI recommendation    Hypokalemia /hypomagnesemia Replace  prn to keep K above 4, mag above 2  QTC prolongation Resolved, monitor QTC every morning, now normalized  Possible UTI -Completed 3 days of IV Rocephin  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 Burr Medico following  Normocytic anemia in the setting of malignancy Hemoglobin stable above 9  Recent left leg DVT Currently on full dose Lovenox, need to hold lovenox on 11/14 due to EGD on Monday 11/15  History of lupus Follows rheumatologist Dr. Lenna Gilford, on weekly methotrexate injection Plaquenil diclofenac  Hypertension/tahycardia On IV Lopressor due to inconsistent oral intake  DVT prophylaxis: Full dose Lovenox   Code Status: Full Family Communication: Patient Disposition:   Status is: Inpatient  Dispo: The patient is from: Home              Anticipated d/c is to: Home              Anticipated d/c date is: To be determined                Consultants:   Oncology Dr Burr Medico  GI  Palliative care  Procedures:   None  Antimicrobials:   As above     Objective: Vitals:   12/08/19 0200 12/08/19 0534 12/08/19 1227 12/08/19 1501  BP: 119/70 136/81 138/83 133/83  Pulse: 93 (!) 105 (!) 133 (!) 105  Resp: 16 17 16 18   Temp: 98.6 F (37 C) 99.3 F (37.4 C)  98.5 F (36.9 C)  TempSrc: Oral Oral  Oral  SpO2: 98% 100% 100% 100%  Weight:      Height:  No intake or output data in the 24 hours ending 12/08/19 1729 Filed Weights   12/04/19 0534 12/04/19 1354 12/05/19 0540  Weight: 80.4 kg 79.9 kg 80.2 kg    Examination:  General exam: calm, NAD Respiratory system: Clear to auscultation. Respiratory effort normal. Cardiovascular system: S1 & S2 heard, RRR. No JVD, no murmur, No pedal edema. Gastrointestinal system: Abdomen is nondistended, soft and nontender.  Normal bowel sounds  heard. Central nervous system: Alert and oriented. No focal neurological deficits. Extremities: Symmetric 5 x 5 power. Skin: No rashes, lesions or ulcers Psychiatry: Judgement and insight appear normal. Mood & affect appropriate.     Data Reviewed: I have personally reviewed following labs and imaging studies  CBC: Recent Labs  Lab 12/02/19 0622 12/03/19 0514 12/04/19 0544 12/05/19 0615  WBC 3.6* 3.8* 3.7* 3.2*  NEUTROABS  --   --   --  1.7  HGB 9.2* 9.3* 9.2* 9.1*  HCT 27.8* 27.9* 28.5* 27.6*  MCV 91.1 90.6 92.2 92.6  PLT 167 165 162 102    Basic Metabolic Panel: Recent Labs  Lab 12/04/19 0544 12/05/19 0615 12/06/19 0833 12/07/19 0517 12/08/19 0502  NA 136 137 138 138 135  K 2.9* 3.2* 4.2 3.6 3.7  CL 105 105 105 106 105  CO2 25 25 26 24  21*  GLUCOSE 115* 105* 98 116* 113*  BUN <5* <5* 5* 12 17  CREATININE 0.56 0.51 0.54 0.53 0.58  CALCIUM 9.1 9.0 9.6 9.3 8.9  MG 1.8 2.0 1.9 1.7 1.9  PHOS  --  3.4 4.3 3.5 2.6    GFR: Estimated Creatinine Clearance: 83.5 mL/min (by C-G formula based on SCr of 0.58 mg/dL).  Liver Function Tests: Recent Labs  Lab 12/04/19 0544 12/05/19 0615 12/06/19 0833 12/07/19 0517 12/08/19 0502  AST 62* 56* 51* 49* 43*  ALT 50* 49* 49* 48* 43  ALKPHOS 103 107 118 130* 118  BILITOT 0.7 0.6 0.6 0.7 0.5  PROT 6.3* 6.4* 6.9 6.5 6.6  ALBUMIN 3.0* 3.0* 3.1* 3.1* 3.0*    CBG: Recent Labs  Lab 12/07/19 1827 12/07/19 2355 12/08/19 0532 12/08/19 0817 12/08/19 1222  GLUCAP 100* 110* 116* 100* 104*     No results found for this or any previous visit (from the past 240 hour(s)).       Radiology Studies: No results found.      Scheduled Meds: . Chlorhexidine Gluconate Cloth  6 each Topical Daily  . enoxaparin  120 mg Subcutaneous Q24H  . fluticasone  2 spray Each Nare Daily  . insulin aspart  0-9 Units Subcutaneous Q6H  . lipase/protease/amylase  24,000 Units Oral TID WC  . metoprolol tartrate  5 mg Intravenous Q6H  .  OLANZapine  7.5 mg Oral QHS  . pantoprazole  40 mg Oral Q1200  . polyethylene glycol  17 g Oral Daily  . sodium chloride flush  10-40 mL Intracatheter Q12H  . sodium chloride flush  3 mL Intravenous Q12H  . sucralfate  1 g Oral TID WC & HS  . topiramate  50 mg Oral QHS   Continuous Infusions: . dextrose 5 % and 0.9% NaCl 10 mL/hr at 12/07/19 1850  . TPN ADULT (ION) 90 mL/hr at 12/07/19 1824  . TPN ADULT (ION)       LOS: 13 days   Time spent: 62mins Greater than 50% of this time was spent in counseling, explanation of diagnosis, planning of further management, and coordination of care.  I have personally reviewed and interpreted  on  12/08/2019 daily labs, tele strips, I reviewed all nursing notes, pharmacy notes, consultant notes,  vitals, pertinent old records  I have discussed plan of care as described above with RN , patient  on 12/08/2019  Voice Recognition /Dragon dictation system was used to create this note, attempts have been made to correct errors. Please contact the author with questions and/or clarifications.   Florencia Reasons, MD PhD FACP [facility]  Available via Epic secure chat 7am-7pm for nonurgent issues Please page for urgent issues To page the attending provider between 7A-7P or the covering provider during after hours 7P-7A, please log into the web site [email or web address] and access using universal [facility] password for that web site. If you do not have the password, please call the hospital operator.    12/08/2019, 5:29 PM

## 2019-12-08 NOTE — Progress Notes (Signed)
PHARMACY - TOTAL PARENTERAL NUTRITION CONSULT NOTE   Indication: intolerance to enteral feeding  Patient Measurements: Height: 5\' 7"  (170.2 cm) Weight: 80.2 kg (176 lb 12.9 oz) IBW/kg (Calculated) : 61.6 TPN AdjBW (KG): 66 Body mass index is 27.69 kg/m. Usual Weight:   Assessment: Patient is a 59 y.o. F with metastatic pancreatic cancer on chemotherapy PTA presented to the ED on 10/30 with c/o abdominal pain and n/v. With patient unable to tolerate oral intake, pharmacy is consulted to start TPN for nutrition.  Glucose / Insulin: no hx DM; on sensitive SSI q6h (0 units/ 24 hrs) - CBGs (goal <150): 106-116 Electrolytes: K 3.7 (amount and rate increased in TPN yesterday), CorrCa 9.7 (Ca removed in TPN), phos down 2.6 (phos increased in TPN yesterday), Mag 1.9 Renal: scr stable, BUN now wnl  LFTs / TGs: AST/ALT down 43/43, Tbili wnl - TG 64 (11/10) Prealbumin / albumin: prealbumin 9 (11/10), albumin 3.0 Intake / Output; MIVF: I/O inaccurate.  UOP volume not charted, Urine occurrence x1 yesterday. - D5NS at 10 ml/hr GI Imaging: - 11/8 abd xray: negative - 11/11 A swallowed 13 mm barium tablet remained in the distal esophagus despite a prolonged period of observation Surgeries / Procedures:  - Per GI, EGD probably on Monday if no improvement in symptoms.  Central access: port-a cath TPN start date: 12/05/19  Nutritional Goals (per RD recommendation on 11/9): Kcal:  2200-2400 Protein:  115-125g Fluid:  2L/day  Goal TPN rate is 90 mL/hr (provides 119 g of protein and 2372 kcals per day)  Current Nutrition:  - NPO --> full liquid on 11/12 - start TPN on 11/10 - Of note, patient has peanut allergy (rxns= hives). Will use SMOFlipid  Plan: At 18:00 - continue TPN at goal rate 90 mL/hr - Electrolytes in TPN: 1mEq/L of Na, continue standard 50 mEq/L of K, cont with no Ca, 20mEq/L of Mg, and increase to 10 mmol/L of Phos. Cl:Ac ratio change to 1:2 - Add standard MVI and trace  elements to TPN - Continue Sensitive q6h SSI and will reduce if maintains CBGs within goal range on goal TPN rate. - Continue mIVF to 10 mL/hr at 1800 - BMet, phos and mag on 12/09/19 - Monitor TPN labs on Mon/Thurs  Gretta Arab PharmD, BCPS Clinical Pharmacist [facility] main pharmacy [phone] 12/08/2019 12:24 PM

## 2019-12-08 NOTE — Progress Notes (Signed)
Subjective: Patient is agreeable for having an endoscopy for possible dilatation on Monday. She is currently on full liquid diet and IV TPN.  Objective: Vital signs in last 24 hours: Temp:  [98.4 F (36.9 C)-99.3 F (37.4 C)] 98.5 F (36.9 C) (11/13 1501) Pulse Rate:  [93-133] 105 (11/13 1501) Resp:  [15-18] 18 (11/13 1501) BP: (118-138)/(70-83) 133/83 (11/13 1501) SpO2:  [98 %-100 %] 100 % (11/13 1501) Weight change:  Last BM Date: 12/05/19  PE: Not in distress GENERAL: Mild pallor, no icterus ABDOMEN: Nondistended EXTREMITIES: No deformity  Lab Results: Results for orders placed or performed during the hospital encounter of 11/24/19 (from the past 48 hour(s))  Glucose, capillary     Status: Abnormal   Collection Time: 12/06/19  5:18 PM  Result Value Ref Range   Glucose-Capillary 106 (H) 70 - 99 mg/dL    Comment: Glucose reference range applies only to samples taken after fasting for at least 8 hours.  Glucose, capillary     Status: Abnormal   Collection Time: 12/07/19 12:43 AM  Result Value Ref Range   Glucose-Capillary 108 (H) 70 - 99 mg/dL    Comment: Glucose reference range applies only to samples taken after fasting for at least 8 hours.  Phosphorus     Status: None   Collection Time: 12/07/19  5:17 AM  Result Value Ref Range   Phosphorus 3.5 2.5 - 4.6 mg/dL    Comment: Performed at [facility], [place] [address], [place], [place] [address]  Magnesium     Status: None   Collection Time: 12/07/19  5:17 AM  Result Value Ref Range   Magnesium 1.7 1.7 - 2.4 mg/dL    Comment: Performed at [facility], [place] [address] [place], [place] [address]  Comprehensive metabolic panel     Status: Abnormal   Collection Time: 12/07/19  5:17 AM  Result Value Ref Range   Sodium 138 135 - 145 mmol/L   Potassium 3.6 3.5 - 5.1 mmol/L   Chloride 106 98 - 111 mmol/L   CO2 24 22 - 32 mmol/L   Glucose, Bld 116 (H) 70 - 99 mg/dL    Comment: Glucose  reference range applies only to samples taken after fasting for at least 8 hours.   BUN 12 6 - 20 mg/dL   Creatinine, Ser 0.53 0.44 - 1.00 mg/dL   Calcium 9.3 8.9 - 10.3 mg/dL   Total Protein 6.5 6.5 - 8.1 g/dL   Albumin 3.1 (L) 3.5 - 5.0 g/dL   AST 49 (H) 15 - 41 U/L   ALT 48 (H) 0 - 44 U/L   Alkaline Phosphatase 130 (H) 38 - 126 U/L   Total Bilirubin 0.7 0.3 - 1.2 mg/dL   GFR, Estimated >60 >60 mL/min    Comment: (NOTE) Calculated using the CKD-EPI Creatinine Equation (2021)    Anion gap 8 5 - 15    Comment: Performed at [facility], [place] [address], [place], [place] [address]  Glucose, capillary     Status: Abnormal   Collection Time: 12/07/19  6:11 AM  Result Value Ref Range   Glucose-Capillary 115 (H) 70 - 99 mg/dL    Comment: Glucose reference range applies only to samples taken after fasting for at least 8 hours.  Glucose, capillary     Status: Abnormal   Collection Time: 12/07/19 11:35 AM  Result Value Ref Range   Glucose-Capillary 106 (H) 70 - 99 mg/dL    Comment:  Glucose reference range applies only to samples taken after fasting for at least 8 hours.  Glucose, capillary     Status: Abnormal   Collection Time: 12/07/19  6:27 PM  Result Value Ref Range   Glucose-Capillary 100 (H) 70 - 99 mg/dL    Comment: Glucose reference range applies only to samples taken after fasting for at least 8 hours.  Glucose, capillary     Status: Abnormal   Collection Time: 12/07/19 11:55 PM  Result Value Ref Range   Glucose-Capillary 110 (H) 70 - 99 mg/dL    Comment: Glucose reference range applies only to samples taken after fasting for at least 8 hours.   Comment 1 Notify RN   Magnesium     Status: None   Collection Time: 12/08/19  5:02 AM  Result Value Ref Range   Magnesium 1.9 1.7 - 2.4 mg/dL    Comment: Performed at [facility], [place] [address], [place], [place] [address]  Phosphorus     Status: None   Collection Time: 12/08/19  5:02 AM   Result Value Ref Range   Phosphorus 2.6 2.5 - 4.6 mg/dL    Comment: Performed at [facility], [place] [address], [place], [place] [address]  Comprehensive metabolic panel     Status: Abnormal   Collection Time: 12/08/19  5:02 AM  Result Value Ref Range   Sodium 135 135 - 145 mmol/L   Potassium 3.7 3.5 - 5.1 mmol/L   Chloride 105 98 - 111 mmol/L   CO2 21 (L) 22 - 32 mmol/L   Glucose, Bld 113 (H) 70 - 99 mg/dL    Comment: Glucose reference range applies only to samples taken after fasting for at least 8 hours.   BUN 17 6 - 20 mg/dL   Creatinine, Ser 0.58 0.44 - 1.00 mg/dL   Calcium 8.9 8.9 - 10.3 mg/dL   Total Protein 6.6 6.5 - 8.1 g/dL   Albumin 3.0 (L) 3.5 - 5.0 g/dL   AST 43 (H) 15 - 41 U/L   ALT 43 0 - 44 U/L   Alkaline Phosphatase 118 38 - 126 U/L   Total Bilirubin 0.5 0.3 - 1.2 mg/dL   GFR, Estimated >60 >60 mL/min    Comment: (NOTE) Calculated using the CKD-EPI Creatinine Equation (2021)    Anion gap 9 5 - 15    Comment: Performed at [facility], [place] [address], [place], [place] [address]  Glucose, capillary     Status: Abnormal   Collection Time: 12/08/19  5:32 AM  Result Value Ref Range   Glucose-Capillary 116 (H) 70 - 99 mg/dL    Comment: Glucose reference range applies only to samples taken after fasting for at least 8 hours.   Comment 1 Notify RN   Glucose, capillary     Status: Abnormal   Collection Time: 12/08/19  8:17 AM  Result Value Ref Range   Glucose-Capillary 100 (H) 70 - 99 mg/dL    Comment: Glucose reference range applies only to samples taken after fasting for at least 8 hours.  Glucose, capillary     Status: Abnormal   Collection Time: 12/08/19 12:22 PM  Result Value Ref Range   Glucose-Capillary 104 (H) 70 - 99 mg/dL    Comment: Glucose reference range applies only to samples taken after fasting for at least 8 hours.    Studies/Results: No results found.  Medications: I have reviewed the patient's current  medications.  Assessment: Abnormal barium swallow,  13 mm barium tablet remained in distal esophagus without appreciable distal esophageal stricture, nonspecific esophageal dysmotility noted  Pancreatic head mass compatible with pancreatic cancer with numerous liver metastases  Plan: Patient agreeable for endoscopy with possible balloon dilatation on Monday. Currently on full liquid diet. Dr. Paulita Fujita to follow tomorrow, possibly keep patient n.p.o. post midnight for EGD with dilatation with Dr. Michail Sermon on Monday. Lovenox will need to be held tomorrow. Continue PPI, on IV TPN and antiemetics as needed.  Ronnette Juniper, MD 12/08/2019, 4:38 PM

## 2019-12-09 DIAGNOSIS — Z515 Encounter for palliative care: Secondary | ICD-10-CM | POA: Diagnosis not present

## 2019-12-09 DIAGNOSIS — Z7189 Other specified counseling: Secondary | ICD-10-CM | POA: Diagnosis not present

## 2019-12-09 DIAGNOSIS — E876 Hypokalemia: Secondary | ICD-10-CM | POA: Diagnosis not present

## 2019-12-09 LAB — BASIC METABOLIC PANEL
Anion gap: 7 (ref 5–15)
BUN: 16 mg/dL (ref 6–20)
CO2: 23 mmol/L (ref 22–32)
Calcium: 8.9 mg/dL (ref 8.9–10.3)
Chloride: 106 mmol/L (ref 98–111)
Creatinine, Ser: 0.55 mg/dL (ref 0.44–1.00)
GFR, Estimated: >60 mL/min (ref >60)
Glucose, Bld: 126 mg/dL — ABNORMAL HIGH (ref 70–99)
Potassium: 3.8 mmol/L (ref 3.5–5.1)
Sodium: 136 mmol/L (ref 135–145)

## 2019-12-09 LAB — GLUCOSE, CAPILLARY
Glucose-Capillary: 109 mg/dL — ABNORMAL HIGH (ref 70–99)
Glucose-Capillary: 111 mg/dL — ABNORMAL HIGH (ref 70–99)
Glucose-Capillary: 115 mg/dL — ABNORMAL HIGH (ref 70–99)
Glucose-Capillary: 121 mg/dL — ABNORMAL HIGH (ref 70–99)
Glucose-Capillary: 98 mg/dL (ref 70–99)

## 2019-12-09 LAB — PHOSPHORUS: Phosphorus: 3.1 mg/dL (ref 2.5–4.6)

## 2019-12-09 LAB — MAGNESIUM: Magnesium: 1.9 mg/dL (ref 1.7–2.4)

## 2019-12-09 MED ORDER — TRAVASOL 10 % IV SOLN
INTRAVENOUS | Status: AC
  Filled 2019-12-09: qty 1188

## 2019-12-09 NOTE — Progress Notes (Signed)
PHARMACY - TOTAL PARENTERAL NUTRITION CONSULT NOTE   Indication: intolerance to enteral feeding  Patient Measurements: Height: 5\' 7"  (170.2 cm) Weight: 80 kg (176 lb 5.9 oz) IBW/kg (Calculated) : 61.6 TPN AdjBW (KG): 66 Body mass index is 27.62 kg/m. Usual Weight:   Assessment: Patient is a 59 y.o. F with metastatic pancreatic cancer on chemotherapy PTA presented to the ED on 10/30 with c/o abdominal pain and n/v. With patient unable to tolerate oral intake, pharmacy is consulted to start TPN for nutrition.  Glucose / Insulin: no hx DM; on sensitive SSI q6h (0 units/ 24 hrs) - CBGs (goal <150): 98-115 Electrolytes: K 3.8, Phos 3.1 Mag 1.9, CorrCa 9.7 (Ca removed in TPN) Renal: scr stable, BUN now wnl  LFTs / TGs: AST/ALT down 43/43, Tbili wnl - TG 64 (11/10) Prealbumin / albumin: prealbumin 9 (11/10), albumin 3.0 Intake / Output; MIVF: I/O inaccurate.  UOP volume not charted, Urine occurrence x1 yesterday. - D5NS at 10 ml/hr GI Imaging: - 11/8 abd xray: negative - 11/11 A swallowed 13 mm barium tablet remained in the distal esophagus despite a prolonged period of observation Surgeries / Procedures:  - Per GI, EGD on Monday with possible dilation.   Central access: port-a cath TPN start date: 12/05/19  Nutritional Goals (per RD recommendation on 11/9): Kcal:  2200-2400 Protein:  115-125g Fluid:  2L/day  Goal TPN rate is 90 mL/hr (provides 119 g of protein and 2372 kcals per day)  Current Nutrition:  - NPO --> full liquid on 11/12 - start TPN on 11/10 - Of note, patient has peanut allergy (rxns= hives). Will use SMOFlipid  Plan: At 18:00 - Continue TPN at goal rate 90 mL/hr - Electrolytes in TPN: 50mEq/L of Na, 50 mEq/L of K, cont with no Ca, 72mEq/L of Mg, and 10 mmol/L of Phos. Cl:Ac ratio 1:2 - Add standard MVI and trace elements to TPN - Continue Sensitive SSI decrease to q8h and will reduce further/hold if maintains CBGs within goal range on goal TPN rate. -  Continue mIVF at 10 mL/hr - Monitor TPN labs on Mon/Thurs  Gretta Arab PharmD, BCPS Clinical Pharmacist [facility] main pharmacy [phone] 12/09/2019 10:48 AM

## 2019-12-09 NOTE — Progress Notes (Signed)
Plan for EGD with Dr. Michail Sermon tomorrow at 10:15.  Full liquid diet, NPO after midnight.  Will receive Lovenox tonight, then will be on hold until after her endoscopy.

## 2019-12-09 NOTE — Progress Notes (Addendum)
PROGRESS NOTE    Cynthia Frye  NOM:[id number] DOB: 1960-12-31 DOA: 11/24/2019 PCP: Josetta Huddle, MD    Chief Complaint  Patient presents with  . Emesis    Brief Narrative:   59 year old unfortunate female history of recently diagnosed metastatic pancreatic adenocarcinoma with mets to the liver 09/19/2019, currently undergoing chemotherapy, recent chemotherapy 11/21/2019 presenting with 1 day history of worsening nausea, vomiting, abdominal pain. Noted to have significant hypokalemia with inability to keep anything down. CT abdomen and pelvis negative for small bowel obstruction. Urinalysis concerning for UTI. Patient started on IV antibiotics, scheduled IV antiemetics, IV fluids, potassium repletion  Subjective:   remains on full liquid ,  currently no nausea no vomiting ,Tolerating TPN  No fever sinus tachycardia appears has improved  Assessment & Plan:   Principal Problem:   Hypokalemia Active Problems:   Essential hypertension   Lupus (systemic lupus erythematosus) (HCC)   Seasonal allergies   Chronic migraine without aura without status migrainosus, not intractable   Pancreatic cancer (HCC)   Acute deep vein thrombosis of left iliac vein (HCC)   Nausea and vomiting in adult   Dehydration   Prolonged QT interval   Emesis   Tachycardia   Anemia associated with chemotherapy   Constipation   Generalized abdominal pain   Malnutrition of moderate degree  Intractable nausea vomiting, dehydration On around-the-clock as needed Zofran, Compazine  nightly Zyprexa TPN started due to poor oral intake Continue PPI GI input appreciated, upper GI series "A swallowed 13 mm barium tablet remained in the distal esophagus despite a prolonged period of observation. However, there is no appreciable distal esophageal stricture. Mild nonspecific esophageal dysmotility" -plan for EGD with possible dilation on Monday, npo after midnight ,will follow GI recommendation  Sinus  tachycardia Blood pressure stable, improved after increase Lopressor dose, continue monitor  Hypokalemia /hypomagnesemia Replace prn to keep K above 4, mag above 2  QTC prolongation Resolved, monitor QTC every morning, now normalized  Possible UTI -Completed 3 days of IV Rocephin  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 Burr Medico following  Normocytic anemia in the setting of malignancy Hemoglobin stable above 9  Recent left leg DVT Currently on full dose Lovenox, need to hold lovenox on 11/14 due to EGD on Monday 11/15  History of lupus Follows rheumatologist Dr. Lenna Gilford, on weekly methotrexate injection Plaquenil diclofenac  Hypertension/tahycardia On IV Lopressor due to inconsistent oral intake  DVT prophylaxis: Full dose Lovenox held on 11/14 for EGD on mondy, resume lovenox when ok with GI   Code Status: Full Family Communication: Patient Disposition:   Status is: Inpatient  Dispo: The patient is from: Home              Anticipated d/c is to: Home              Anticipated d/c date is: To be determined                Consultants:   Oncology Dr Burr Medico  GI  Palliative care  Procedures:   None  Antimicrobials:   As above     Objective: Vitals:   12/08/19 2353 12/09/19 0210 12/09/19 0450 12/09/19 0500  BP: 128/88 127/75 114/77   Pulse: (!) 126 (!) 109 (!) 119   Resp: 14 14 14    Temp: 99.4 F (37.4 C) 98.7 F (  37.1 C) 98.8 F (37.1 C)   TempSrc: Oral Oral Oral   SpO2: 100% 99% 100%   Weight:    80 kg  Height:        Intake/Output Summary (Last 24 hours) at 12/09/2019 0840 Last data filed at 12/09/2019 6378 Gross per 24 hour  Intake 3529.87 ml  Output --  Net 3529.87 ml   Filed Weights   12/04/19 1354 12/05/19 0540 12/09/19 0500  Weight: 79.9 kg 80.2 kg 80 kg     Examination:  General exam: calm, NAD Respiratory system: Clear to auscultation. Respiratory effort normal. Cardiovascular system: tachycardia, No pedal edema. Gastrointestinal system: Abdomen is nondistended, soft and nontender.  Normal bowel sounds heard. Central nervous system: Alert and oriented. No focal neurological deficits. Extremities: Symmetric 5 x 5 power. Skin: No rashes, lesions or ulcers Psychiatry: Judgement and insight appear normal. Mood & affect appropriate.     Data Reviewed: I have personally reviewed following labs and imaging studies  CBC: Recent Labs  Lab 12/03/19 0514 12/04/19 0544 12/05/19 0615  WBC 3.8* 3.7* 3.2*  NEUTROABS  --   --  1.7  HGB 9.3* 9.2* 9.1*  HCT 27.9* 28.5* 27.6*  MCV 90.6 92.2 92.6  PLT 165 162 588    Basic Metabolic Panel: Recent Labs  Lab 12/05/19 0615 12/06/19 0833 12/07/19 0517 12/08/19 0502 12/09/19 0623  NA 137 138 138 135 136  K 3.2* 4.2 3.6 3.7 3.8  CL 105 105 106 105 106  CO2 25 26 24  21* 23  GLUCOSE 105* 98 116* 113* 126*  BUN <5* 5* 12 17 16   CREATININE 0.51 0.54 0.53 0.58 0.55  CALCIUM 9.0 9.6 9.3 8.9 8.9  MG 2.0 1.9 1.7 1.9 1.9  PHOS 3.4 4.3 3.5 2.6 3.1    GFR: Estimated Creatinine Clearance: 83.5 mL/min (by C-G formula based on SCr of 0.55 mg/dL).  Liver Function Tests: Recent Labs  Lab 12/04/19 0544 12/05/19 0615 12/06/19 0833 12/07/19 0517 12/08/19 0502  AST 62* 56* 51* 49* 43*  ALT 50* 49* 49* 48* 43  ALKPHOS 103 107 118 130* 118  BILITOT 0.7 0.6 0.6 0.7 0.5  PROT 6.3* 6.4* 6.9 6.5 6.6  ALBUMIN 3.0* 3.0* 3.1* 3.1* 3.0*    CBG: Recent Labs  Lab 12/08/19 1222 12/08/19 1759 12/08/19 2349 12/09/19 0448 12/09/19 0749  GLUCAP 104* 98 114* 115* 98     No results found for this or any previous visit (from the past 240 hour(s)).       Radiology Studies: No results found.      Scheduled Meds: . Chlorhexidine Gluconate Cloth  6 each Topical Daily  . fluticasone  2  spray Each Nare Daily  . insulin aspart  0-9 Units Subcutaneous Q6H  . lipase/protease/amylase  24,000 Units Oral TID WC  . metoprolol tartrate  5 mg Intravenous Q6H  . OLANZapine  7.5 mg Oral QHS  . pantoprazole  40 mg Oral Q1200  . polyethylene glycol  17 g Oral Daily  . sodium chloride flush  10-40 mL Intracatheter Q12H  . sodium chloride flush  3 mL Intravenous Q12H  . sucralfate  1 g Oral TID WC & HS  . topiramate  50 mg Oral QHS   Continuous Infusions: . dextrose 5 % and 0.9% NaCl 10 mL/hr at 12/09/19 0308  . TPN ADULT (ION) 90 mL/hr at 12/09/19 0308     LOS: 14 days   Time spent: 23mins Greater than 50% of this time was spent in  counseling, explanation of diagnosis, planning of further management, and coordination of care.  I have personally reviewed and interpreted on  12/09/2019 daily labs, tele strips, I reviewed all nursing notes, pharmacy notes, consultant notes,  vitals, pertinent old records  I have discussed plan of care as described above with RN , patient  on 12/09/2019  Voice Recognition /Dragon dictation system was used to create this note, attempts have been made to correct errors. Please contact the author with questions and/or clarifications.   Florencia Reasons, MD PhD FACP [facility]  Available via Epic secure chat 7am-7pm for nonurgent issues Please page for urgent issues To page the attending provider between 7A-7P or the covering provider during after hours 7P-7A, please log into the web site [email or web address] and access using universal [place] password for that web site. If you do not have the password, please call the hospital operator.    12/09/2019, 8:40 AM

## 2019-12-09 NOTE — H&P (View-Only) (Signed)
Plan for EGD with Dr. Michail Sermon tomorrow at 10:15.  Full liquid diet, NPO after midnight.  Will receive Lovenox tonight, then will be on hold until after her endoscopy.

## 2019-12-10 ENCOUNTER — Encounter (HOSPITAL_COMMUNITY): Payer: Self-pay | Admitting: Internal Medicine

## 2019-12-10 ENCOUNTER — Inpatient Hospital Stay (HOSPITAL_COMMUNITY): Payer: BC Managed Care – PPO | Admitting: Certified Registered"

## 2019-12-10 ENCOUNTER — Encounter (HOSPITAL_COMMUNITY)
Admission: EM | Disposition: A | Discharge status: Home Health | Payer: Self-pay | Source: Home / Self Care | Attending: Internal Medicine

## 2019-12-10 DIAGNOSIS — E876 Hypokalemia: Secondary | ICD-10-CM | POA: Diagnosis not present

## 2019-12-10 DIAGNOSIS — R1310 Dysphagia, unspecified: Secondary | ICD-10-CM

## 2019-12-10 HISTORY — PX: BIOPSY: SHX5522

## 2019-12-10 HISTORY — PX: ESOPHAGOGASTRODUODENOSCOPY (EGD) WITH PROPOFOL: SHX5813

## 2019-12-10 LAB — DIFFERENTIAL
Abs Immature Granulocytes: 0.01 10*3/uL (ref 0.00–0.07)
Basophils Absolute: 0.1 10*3/uL (ref 0.0–0.1)
Basophils Relative: 2 %
Eosinophils Absolute: 0.2 10*3/uL (ref 0.0–0.5)
Eosinophils Relative: 6 %
Immature Granulocytes: 0 %
Lymphocytes Relative: 22 %
Lymphs Abs: 0.7 10*3/uL (ref 0.7–4.0)
Monocytes Absolute: 0.6 10*3/uL (ref 0.1–1.0)
Monocytes Relative: 18 %
Neutro Abs: 1.7 10*3/uL (ref 1.7–7.7)
Neutrophils Relative %: 52 %

## 2019-12-10 LAB — CBC
HCT: 27.2 % — ABNORMAL LOW (ref 36.0–46.0)
Hemoglobin: 8.7 g/dL — ABNORMAL LOW (ref 12.0–15.0)
MCH: 30.1 pg (ref 26.0–34.0)
MCHC: 32 g/dL (ref 30.0–36.0)
MCV: 94.1 fL (ref 80.0–100.0)
Platelets: 144 10*3/uL — ABNORMAL LOW (ref 150–400)
RBC: 2.89 MIL/uL — ABNORMAL LOW (ref 3.87–5.11)
RDW: 15.2 % (ref 11.5–15.5)
WBC: 3.3 10*3/uL — ABNORMAL LOW (ref 4.0–10.5)
nRBC: 0 % (ref 0.0–0.2)

## 2019-12-10 LAB — COMPREHENSIVE METABOLIC PANEL
ALT: 45 U/L — ABNORMAL HIGH (ref 0–44)
AST: 52 U/L — ABNORMAL HIGH (ref 15–41)
Albumin: 2.9 g/dL — ABNORMAL LOW (ref 3.5–5.0)
Alkaline Phosphatase: 124 U/L (ref 38–126)
Anion gap: 8 (ref 5–15)
BUN: 15 mg/dL (ref 6–20)
CO2: 21 mmol/L — ABNORMAL LOW (ref 22–32)
Calcium: 8.9 mg/dL (ref 8.9–10.3)
Chloride: 105 mmol/L (ref 98–111)
Creatinine, Ser: 0.53 mg/dL (ref 0.44–1.00)
GFR, Estimated: >60 mL/min (ref >60)
Glucose, Bld: 112 mg/dL — ABNORMAL HIGH (ref 70–99)
Potassium: 4.2 mmol/L (ref 3.5–5.1)
Sodium: 134 mmol/L — ABNORMAL LOW (ref 135–145)
Total Bilirubin: 0.4 mg/dL (ref 0.3–1.2)
Total Protein: 6.5 g/dL (ref 6.5–8.1)

## 2019-12-10 LAB — TRIGLYCERIDES: Triglycerides: 54 mg/dL (ref <150)

## 2019-12-10 LAB — GLUCOSE, CAPILLARY
Glucose-Capillary: 128 mg/dL — ABNORMAL HIGH (ref 70–99)
Glucose-Capillary: 97 mg/dL (ref 70–99)
Glucose-Capillary: 116 mg/dL — ABNORMAL HIGH (ref 70–99)

## 2019-12-10 LAB — MAGNESIUM: Magnesium: 1.8 mg/dL (ref 1.7–2.4)

## 2019-12-10 LAB — PREALBUMIN: Prealbumin: 8.8 mg/dL — ABNORMAL LOW (ref 18–38)

## 2019-12-10 LAB — PHOSPHORUS: Phosphorus: 3.6 mg/dL (ref 2.5–4.6)

## 2019-12-10 SURGERY — ESOPHAGOGASTRODUODENOSCOPY (EGD) WITH PROPOFOL
Anesthesia: Monitor Anesthesia Care

## 2019-12-10 MED ORDER — PROPOFOL 500 MG/50ML IV EMUL
INTRAVENOUS | Status: AC
  Filled 2019-12-10: qty 50

## 2019-12-10 MED ORDER — ENOXAPARIN SODIUM 120 MG/0.8ML ~~LOC~~ SOLN
120.0000 mg | SUBCUTANEOUS | Status: DC
  Administered 2019-12-10 – 2019-12-11 (×2): 120 mg via SUBCUTANEOUS
  Filled 2019-12-10 (×2): qty 0.8

## 2019-12-10 MED ORDER — PROPOFOL 500 MG/50ML IV EMUL
INTRAVENOUS | Status: DC | PRN
  Administered 2019-12-10: 150 ug/kg/min via INTRAVENOUS

## 2019-12-10 MED ORDER — PROPOFOL 10 MG/ML IV BOLUS
INTRAVENOUS | Status: DC | PRN
  Administered 2019-12-10: 30 mg via INTRAVENOUS

## 2019-12-10 MED ORDER — LACTATED RINGERS IV SOLN: INTRAVENOUS | Status: DC | PRN

## 2019-12-10 MED ORDER — PROPOFOL 10 MG/ML IV BOLUS
INTRAVENOUS | Status: AC
  Filled 2019-12-10: qty 40

## 2019-12-10 MED ORDER — TRAVASOL 10 % IV SOLN
INTRAVENOUS | Status: AC
  Filled 2019-12-10: qty 1188

## 2019-12-10 MED ORDER — MAGNESIUM SULFATE 2 GM/50ML IV SOLN
2.0000 g | Freq: Once | INTRAVENOUS | Status: AC
  Administered 2019-12-10: 2 g via INTRAVENOUS
  Filled 2019-12-10: qty 50

## 2019-12-10 SURGICAL SUPPLY — 15 items

## 2019-12-10 NOTE — Progress Notes (Signed)
Physical Therapy Treatment Patient Details Name: Cynthia Frye MRN: [id number] DOB: 1960-03-31 Today's Date: 12/10/2019    History of Present Illness 59 year old unfortunate female history of recently diagnosed metastatic pancreatic adenocarcinoma with mets to the liver 09/19/2019, currently undergoing chemotherapy, recent chemotherapy 11/21/2019 presenting with 1 day history of worsening nausea, vomiting, abdominal pain.  Noted to have significant hypokalemia, admitted to [facility]    PT Comments    Pt tolerates therapeutic exercise this session, with increased heart racing sensation with STS reps. Pt with good motor control, able to recall exercise appropriately and perform with correct technique. Pt's HR 134 max with STS reps and exercise. NT in room due to elevated HR at EOS checking pt vitals; ambulation not attempted due to elevated HR. Pt will benefit from continued physical therapy in hospital and recommendations below to increase strength, balance, endurance for safe ADLs and gait.    Follow Up Recommendations  No PT follow up     Equipment Recommendations  Rolling walker with 5" wheels    Recommendations for Other Services       Precautions / Restrictions Precautions Precautions: Other (comment) Precaution Comments: CHEMO precautions, monitor HR Restrictions Weight Bearing Restrictions: No    Mobility  Bed Mobility  General bed mobility comments: in chair upon arrival  Transfers Overall transfer level: Needs assistance Equipment used: Rolling walker (2 wheeled) Transfers: Sit to/from Stand Sit to Stand: Supervision    General transfer comment: cues for proper hand placement with good carryover, BUE assisting to power up from chair  Ambulation/Gait  General Gait Details: HR 134 with static standing so ambulation not attempted   Stairs             Wheelchair Mobility    Modified Rankin (Stroke Patients Only)       Balance Overall balance assessment:  Needs assistance  Standing balance support: During functional activity;No upper extremity supported Standing balance-Leahy Scale: Fair Standing balance comment: static standing at RW without UE support             Cognition Arousal/Alertness: Awake/alert Behavior During Therapy: WFL for tasks assessed/performed Overall Cognitive Status: Within Functional Limits for tasks assessed                   Exercises General Exercises - Lower Extremity Gluteal Sets: Seated;AROM;Strengthening;Both;15 reps Long Arc Quad: Seated;AROM;Strengthening;Both;15 reps Other Exercises Other Exercises: STS from chair, 10 reps, BUE assisting with RW positioned in front    General Comments General comments (skin integrity, edema, etc.): HR 134 bpm max noted with exercise and static standing      Pertinent Vitals/Pain Pain Assessment: No/denies pain    Home Living                      Prior Function            PT Goals (current goals can now be found in the care plan section) Acute Rehab PT Goals Patient Stated Goal: be able to exercise and socialize, 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 PT Goal Formulation: With patient Time For Goal Achievement: 12/09/19 Potential to Achieve Goals: Good Progress towards PT goals: Progressing toward goals    Frequency    Min 3X/week      PT Plan Current plan remains appropriate    Co-evaluation              AM-PAC PT "6 Clicks" Mobility   Outcome Measure  Help needed turning from your back  to your side while in a flat bed without using bedrails?: None Help needed moving from lying on your back to sitting on the side of a flat bed without using bedrails?: A Little Help needed moving to and from a bed to a chair (including a wheelchair)?: None Help needed standing up from a chair using your arms (e.g., wheelchair or bedside chair)?: None Help needed to walk in hospital room?: None Help needed climbing 3-5 steps with a railing? :  A Little 6 Click Score: 22    End of Session Equipment Utilized During Treatment: Gait belt Activity Tolerance: Treatment limited secondary to medical complications (Comment) (elevated HR) Patient left: with call bell/phone within reach;in chair;with nursing/sitter in room (NT in room checking vitals) Nurse Communication: Mobility status PT Visit Diagnosis: Difficulty in walking, not elsewhere classified (R26.2)     Time: 1424-1440 PT Time Calculation (min) (ACUTE ONLY): 16 min  Charges:  $Therapeutic Exercise: 8-22 mins                      Tori Zeric Baranowski PT, DPT 12/10/19, 4:00 PM

## 2019-12-10 NOTE — Progress Notes (Addendum)
PHARMACY - TOTAL PARENTERAL NUTRITION CONSULT NOTE   Indication: intolerance to enteral feeding  Patient Measurements: Height: 5\' 7"  (170.2 cm) Weight: 79.9 kg (176 lb 2.4 oz) IBW/kg (Calculated) : 61.6 TPN AdjBW (KG): 66 Body mass index is 27.59 kg/m. Usual Weight:   Assessment: Patient is a 59 y.o. F with metastatic pancreatic cancer on chemotherapy PTA presented to the ED on 10/30 with c/o abdominal pain and n/v. With patient unable to tolerate oral intake, pharmacy is consulted to start TPN for nutrition.  Glucose / Insulin: no hx DM; on sensitive SSI q6h (2 units/ 24 hrs) - CBGs (goal <150) Electrolytes: K 4.2, Phos 3.6, Mag 1.8, CorrCa 9.78 (Ca removed in TPN), CO2 slightly low Renal: SCr stable, BUN wnl  LFTs / TGs: AST/ALT up 52/45, Tbili wnl - TG 64 (11/10), 54 (11/15) Prealbumin / albumin: prealbumin 9 (11/10), 8.8 (11/15), albumin 2.9 Intake / Output; MIVF: I/O inaccurate.  UOP volume not charted, Urine occurrence x1 yesterday. - D5NS at 10 ml/hr GI Imaging: - 11/8 abd xray: negative - 11/11 A swallowed 13 mm barium tablet remained in the distal esophagus despite a prolonged period of observation Surgeries / Procedures:  - Per GI, EGD on Monday with possible dilation.   Central access: port-a cath TPN start date: 12/05/19  Nutritional Goals (per RD recommendation on 11/9): Kcal:  2200-2400 Protein:  115-125g Fluid:  2L/day  Goal TPN rate is 90 mL/hr (provides 119 g of protein and 2372 kcals per day)  Current Nutrition:  - NPO --> full liquid on 11/12, NPO currently for EGD - start TPN on 11/10 - Of note, patient has peanut allergy (rxns= hives). Will use SMOFlipid  Plan:  Now: Magnesium sulfate 2g IV x 1  At 18:00 - Continue TPN at goal rate 90 mL/hr - Electrolytes in TPN: increase 75mEq/L of Na, 50 mEq/L of K, cont with no Ca, 77mEq/L of Mg, and 10 mmol/L of Phos. Cl:Ac ratio 1:2 - Add standard MVI and trace elements to TPN - Continue Sensitive SSI  decrease to q8h and will reduce further/hold if maintains CBGs within goal range on goal TPN rate. - Continue mIVF at 10 mL/hr - Monitor TPN labs on Mon/Thurs, BMET in AM  Peggyann Juba, PharmD, BCPS [facility] main pharmacy [phone] 12/10/2019 8:11 AM

## 2019-12-10 NOTE — Progress Notes (Signed)
PROGRESS NOTE    Cynthia Frye  JEH:[id number] DOB: Feb 20, 1960 DOA: 11/24/2019 PCP: Josetta Huddle, MD   Chief Complain: Vomiting Brief Narrative: Patient is a 59 year old female with history of recently diagnosed metastatic pancreatic adenocarcinoma with mets to the liver, currently on chemotherapy who presented with nausea, vomiting, abdominal pain.  Also found to be significantly hypokalemic on presentation.  CT abdomen/pelvis was negative for SBO on presentation.  Urinalysis was concerning for UTI.  Due to persistent poor oral intake, she underwent TPN initiation.  Prolonged hospital course.  GI consulted and underwent EGD today.    Assessment & Plan:   Principal Problem:   Hypokalemia Active Problems:   Essential hypertension   Lupus (systemic lupus erythematosus) (HCC)   Seasonal allergies   Chronic migraine without aura without status migrainosus, not intractable   Pancreatic cancer (HCC)   Acute deep vein thrombosis of left iliac vein (HCC)   Nausea and vomiting in adult   Dehydration   Prolonged QT interval   Emesis   Tachycardia   Anemia associated with chemotherapy   Constipation   Generalized abdominal pain   Malnutrition of moderate degree   Dysphagia   Intractable nausea/vomiting/dehydration: Continue antiemetics.  Also started on Zyprexa for persistent nausea. On PPI.  Started on TPN due to persistent poor oral intake.  GI consulted.  She underwent EGD today.  Found to have esophageal ulcer with stigmata of recent bleeding, gastritis, gastric/duodenal thickening concerning for inflammation from mets or due to mets from pancreatic cancer. Currently she is on clear liquid diet.  Will try to advance her diet, if she is not able to tolerate advance diet,we shd plan for discharge to home with TPN.  Pancreatic adenocarcinoma with multiple liver lesions: Diagnosed in August , 2021.  Follows with Dr. Burr Medico.  On chemotherapy  Sinus tachycardia: Currently  stable.  Electrolyte abnormalities:We will continue to monitor and supplement  QTc prolongation: Resolved  Suspected UTI: Completed 3 days of IV Rocephin.  Normocytic anemia: Currently hemoglobin stable in the range of 8  Left leg DVT: On Lovenox.  DVT most likely associated with malignancy.  History of lupus: Follows with rheumatology.  On methotrexate injection weekly, Plaquenil        Nutrition Problem: Moderate Malnutrition Etiology: chronic illness, cancer and cancer related treatments      DVT prophylaxis:Lovenox Code Status:Full  Family Communication: None at the bedside Status is: Inpatient  Remains inpatient appropriate because:Inpatient level of care appropriate due to severity of illness   Dispo: The patient is from: Home              Anticipated d/c is to: Home              Anticipated d/c date is: 1-2 days              Patient currently is not medically stable to d/c.    Consultants: GI  Procedures:EGD  Antimicrobials:  Anti-infectives (From admission, onward)   Start     Dose/Rate Route Frequency Ordered Stop   11/24/19 2000  cefTRIAXone (ROCEPHIN) 1 g in sodium chloride 0.9 % 100 mL IVPB  Status:  Discontinued        1 g 200 mL/hr over 30 Minutes Intravenous Every 24 hours 11/24/19 1912 11/27/19 0759      Subjective:  Patient seen and examined the bedside this afternoon.  Hemodynamically stable during my evaluation.  Calm and comfortable.  Had EGD today.  Denies any nausea, vomiting or abdominal pain  Objective: Vitals:   12/10/19 0517 12/10/19 0945 12/10/19 1100 12/10/19 1203  BP: 130/79 (!) 141/94 (!) 149/88 127/81  Pulse: (!) 106 (!) 128 (!) 116 99  Resp: 14 (!) 26 (!) 26 15  Temp: 98.2 F (36.8 C) 98 F (36.7 C)  98.9 F (37.2 C)  TempSrc: Oral Oral  Oral  SpO2: 100% 100% 100% 100%  Weight:  79.9 kg    Height:  5\' 7"  (1.702 m)      Intake/Output Summary (Last 24 hours) at 12/10/2019 1322 Last data filed at 12/10/2019  1052 Gross per 24 hour  Intake 2416.79 ml  Output --  Net 2416.79 ml   Filed Weights   12/09/19 0500 12/10/19 0500 12/10/19 0945  Weight: 80 kg 79.9 kg 79.9 kg    Examination:  General exam: Appears calm and comfortable ,Not in distress,average built HEENT:PERRL,Oral mucosa moist, Ear/Nose normal on gross exam Respiratory system: Bilateral equal air entry, normal vesicular breath sounds, no wheezes or crackles  Cardiovascular system: S1 & S2 heard, RRR. No JVD, murmurs, rubs, gallops or clicks. No pedal edema.  Chemo-Port on the left chest Gastrointestinal system: Abdomen is nondistended, soft and nontender. No organomegaly or masses felt. Normal bowel sounds heard. Central nervous system: Alert and oriented. No focal neurological deficits. Extremities: No edema, no clubbing ,no cyanosis Skin: No rashes, lesions or ulcers,no icterus ,no pallor  Data Reviewed: I have personally reviewed following labs and imaging studies  CBC: Recent Labs  Lab 12/04/19 0544 12/05/19 0615 12/10/19 0725  WBC 3.7* 3.2* 3.3*  NEUTROABS  --  1.7 1.7  HGB 9.2* 9.1* 8.7*  HCT 28.5* 27.6* 27.2*  MCV 92.2 92.6 94.1  PLT 162 169 948*   Basic Metabolic Panel: Recent Labs  Lab 12/06/19 0833 12/07/19 0517 12/08/19 0502 12/09/19 0623 12/10/19 0725  NA 138 138 135 136 134*  K 4.2 3.6 3.7 3.8 4.2  CL 105 106 105 106 105  CO2 26 24 21* 23 21*  GLUCOSE 98 116* 113* 126* 112*  BUN 5* 12 17 16 15   CREATININE 0.54 0.53 0.58 0.55 0.53  CALCIUM 9.6 9.3 8.9 8.9 8.9  MG 1.9 1.7 1.9 1.9 1.8  PHOS 4.3 3.5 2.6 3.1 3.6   GFR: Estimated Creatinine Clearance: 83.4 mL/min (by C-G formula based on SCr of 0.53 mg/dL). Liver Function Tests: Recent Labs  Lab 12/05/19 0615 12/06/19 0833 12/07/19 0517 12/08/19 0502 12/10/19 0725  AST 56* 51* 49* 43* 52*  ALT 49* 49* 48* 43 45*  ALKPHOS 107 118 130* 118 124  BILITOT 0.6 0.6 0.7 0.5 0.4  PROT 6.4* 6.9 6.5 6.6 6.5  ALBUMIN 3.0* 3.1* 3.1* 3.0* 2.9*   No  results for input(s): LIPASE, AMYLASE in the last 168 hours. No results for input(s): AMMONIA in the last 168 hours. Coagulation Profile: No results for input(s): INR, PROTIME in the last 168 hours. Cardiac Enzymes: No results for input(s): CKTOTAL, CKMB, CKMBINDEX, TROPONINI in the last 168 hours. BNP (last 3 results) No results for input(s): PROBNP in the last 8760 hours. HbA1C: No results for input(s): HGBA1C in the last 72 hours. CBG: Recent Labs  Lab 12/09/19 1156 12/09/19 1817 12/09/19 2343 12/10/19 0511 12/10/19 1158  GLUCAP 111* 109* 121* 128* 97   Lipid Profile: Recent Labs    12/10/19 0725  TRIG 54   Thyroid Function Tests: No results for input(s): TSH, T4TOTAL, FREET4, T3FREE, THYROIDAB in the last 72 hours. Anemia Panel: No results for input(s): VITAMINB12, FOLATE, FERRITIN, TIBC,  IRON, RETICCTPCT in the last 72 hours. Sepsis Labs: No results for input(s): PROCALCITON, LATICACIDVEN in the last 168 hours.  No results found for this or any previous visit (from the past 240 hour(s)).       Radiology Studies: No results found.      Scheduled Meds: . Chlorhexidine Gluconate Cloth  6 each Topical Daily  . fluticasone  2 spray Each Nare Daily  . insulin aspart  0-9 Units Subcutaneous Q6H  . lipase/protease/amylase  24,000 Units Oral TID WC  . metoprolol tartrate  5 mg Intravenous Q6H  . OLANZapine  7.5 mg Oral QHS  . pantoprazole  40 mg Oral Q1200  . polyethylene glycol  17 g Oral Daily  . sodium chloride flush  10-40 mL Intracatheter Q12H  . sodium chloride flush  3 mL Intravenous Q12H  . sucralfate  1 g Oral TID WC & HS  . topiramate  50 mg Oral QHS   Continuous Infusions: . dextrose 5 % and 0.9% NaCl 10 mL/hr at 12/10/19 0056  . TPN ADULT (ION) 90 mL/hr at 12/10/19 0136  . TPN ADULT (ION)       LOS: 15 days    Time spent: 35 mins.More than 50% of that time was spent in counseling and/or coordination of care.      Shelly Coss,  MD [facility] P11/15/2021, 1:22 PM

## 2019-12-10 NOTE — Interval H&P Note (Signed)
History and Physical Interval Note:  12/10/2019 10:05 AM  Cynthia Frye  has presented today for surgery, with the diagnosis of abnormal barium swallow.  The various methods of treatment have been discussed with the patient and family. After consideration of risks, benefits and other options for treatment, the patient has consented to  Procedure(s): ESOPHAGOGASTRODUODENOSCOPY (EGD) WITH PROPOFOL (N/A) as a surgical intervention.  The patient's history has been reviewed, patient examined, no change in status, stable for surgery.  I have reviewed the patient's chart and labs.  Questions were answered to the patient's satisfaction.     Lear Ng

## 2019-12-10 NOTE — Anesthesia Postprocedure Evaluation (Signed)
Anesthesia Post Note  Patient: Cynthia Frye  Procedure(s) Performed: ESOPHAGOGASTRODUODENOSCOPY (EGD) WITH PROPOFOL (N/A ) BIOPSY     Patient location during evaluation: Endoscopy Anesthesia Type: MAC Level of consciousness: awake and alert Pain management: pain level controlled Vital Signs Assessment: post-procedure vital signs reviewed and stable Respiratory status: spontaneous breathing, nonlabored ventilation, respiratory function stable and patient connected to nasal cannula oxygen Cardiovascular status: blood pressure returned to baseline and stable Postop Assessment: no apparent nausea or vomiting Anesthetic complications: no   No complications documented.  Last Vitals:  Vitals:   12/10/19 0945 12/10/19 1100  BP: (!) 141/94 (!) 149/88  Pulse: (!) 128 (!) 116  Resp: (!) 26 (!) 26  Temp: 36.7 C   SpO2: 100% 100%    Last Pain:  Vitals:   12/10/19 1100  TempSrc:   PainSc: 0-No pain                 Donyell Ding L Jeanene Mena

## 2019-12-10 NOTE — Brief Op Note (Signed)
No esophageal stricture or ring seen. Suspect esophageal dysmotility as source of the dysphagia. Small GEJ ulcer noted. Duodenal and gastric inflammation concerning for mets from pancreatic cancer. See endopro note for complete findings/recs. Will f/u.

## 2019-12-10 NOTE — Transfer of Care (Signed)
Immediate Anesthesia Transfer of Care Note  Patient: Cynthia Frye  Procedure(s) Performed: ESOPHAGOGASTRODUODENOSCOPY (EGD) WITH PROPOFOL (N/A ) BIOPSY  Patient Location: PACU and Endoscopy Unit  Anesthesia Type:MAC  Level of Consciousness: awake, drowsy and patient cooperative  Airway & Oxygen Therapy: Patient Spontanous Breathing and Patient connected to face mask oxygen  Post-op Assessment: Report given to RN and Post -op Vital signs reviewed and stable  Post vital signs: Reviewed and stable  Last Vitals:  Vitals Value Taken Time  BP    Temp    Pulse    Resp 22 12/10/19 1052  SpO2    Vitals shown include unvalidated device data.  Last Pain:  Vitals:   12/10/19 0945  TempSrc: Oral  PainSc: 0-No pain      Patients Stated Pain Goal: 2 (17/00/17 4944)  Complications: No complications documented.

## 2019-12-10 NOTE — Anesthesia Procedure Notes (Signed)
Procedure Name: MAC Date/Time: 12/10/2019 10:25 AM Performed by: Eben Burow, CRNA Pre-anesthesia Checklist: Patient identified, Emergency Drugs available, Suction available, Patient being monitored and Timeout performed Oxygen Delivery Method: Simple face mask Placement Confirmation: positive ETCO2

## 2019-12-10 NOTE — Progress Notes (Signed)
HEMATOLOGY-ONCOLOGY PROGRESS NOTE  SUBJECTIVE: Pt underwent EGD today. She is tolerating clear liquid and Jello.  She would like to try applesauce.  She has many questions about home TPN.   Oncology History Overview Note  Cancer Staging Pancreatic cancer [facility]) Staging form: Exocrine Pancreas, AJCC 8th Edition - Clinical stage from 10/18/2019: Stage IV (cT3, cN1, cM1) - Signed by Truitt Merle, MD on 10/18/2019    Pancreatic cancer ([place])  09/19/2019 Imaging   CT AP w contrast 09/19/19  IMPRESSION: 1. Findings are highly concerning for probable primary pancreatic adenocarcinoma in the anterior aspect of the pancreatic head. Several prominent borderline enlarged lymph nodes are noted in the hepatoduodenal nodal station, and there are multiple indeterminate liver lesions which are highly concerning for probable hepatic metastases. Further evaluation with nonemergent abdominal MRI with and without IV gadolinium with MRCP is recommended in the near future to better evaluate these findings.   09/25/2019 Procedure   Upper EUS by Dr Paulita Fujita  IMPRESSION -There was no evidence of significant pathology in the left lobe of the liver.  -A few lymph nodes were visualized and measures in the peripancreatic region and porta hepata region.  -Hyperchoic material consistent with sludge was visualized endosonographically in the gallbladder.  -There was no sign of significant pathology in the common bile duct.  -A mass was identified in the pancreatic head. If biopsy results show adenocarcinoma, it would be staged T3N1Mx by endosonographic criteria. Fine needle aspiration performed.    09/25/2019 Initial Biopsy   FINAL MICROSCOPIC DIAGNOSIS: Fine needle aspirate, Pancreas;  MALIGNANT CELLS PRESENT CONSISTENT WITH ADENOCARCINOMA.    10/02/2019 Initial Diagnosis   Pancreatic cancer ([place])   10/11/2019 Procedure   PAC placement y Dr Barry Dienes    10/15/2019 Imaging   CT Chest  IMPRESSION: 1. Small anterior left  pneumothorax with dependent atelectasis in the left lower lobe. 2. Increased number of bilateral axillary and subpectoral lymph nodes with mild lymphadenopathy in the left axilla. While this would be an atypical presentation for metastatic pancreatic cancer, this possibility is not excluded 3. Main duct dilatation in the pancreas, better assessed on abdomen CT 09/19/2019.   10/16/2019 Imaging   MRI Abdomen  IMPRESSION: 1. Substantially motion degraded scan. 2. Probable persistent small anterior left lung base pneumothorax, better seen on chest CT from 1 day prior. 3. Poorly marginated hypoenhancing 3.7 x 2.9 cm pancreatic head mass, which appears to invade the anterior peripancreatic fat, compatible with known pancreatic adenocarcinoma. Diffuse irregular dilatation of the main pancreatic duct in the pancreatic body and tail. Mild narrowing of the main portal vein by the mass. Abdominal vasculature remains patent and otherwise uninvolved. 4. Numerous (greater than 10) small liver masses scattered throughout the liver, largest 1.0 cm, which appear to demonstrate targetoid enhancement on the limited motion degraded postcontrast sequences, compatible with liver metastases. 5. Mild porta hepatis adenopathy, suspicious for metastatic disease.   10/18/2019 Cancer Staging   Staging form: Exocrine Pancreas, AJCC 8th Edition - Clinical stage from 10/18/2019: Stage IV (cT3, cN1, cM1) - Signed by Truitt Merle, MD on 10/18/2019   10/24/2019 -  Chemotherapy   First-line FOLFIRINOX q2weeks starting 10/24/19   10/30/2019 Pathology Results   FINAL MICROSCOPIC DIAGNOSIS:   A. LIVER, LESION, BIOPSY:  -  Metastatic carcinoma  -  See comment   COMMENT:   By immunohistochemistry, the neoplastic cells are positive for  cytokeratin 7 and GATA3 with patchy nonspecific staining for PAX 8 but  negative for TTF-1, CDX2 and cytokeratin 20.  Overall, the findings are  consistent with metastasis of the  patient's known breast carcinoma.  Prognostic panel (ER, PR, Her-2) is pending and will be reported in an  addendum.    ADDENDUM:   Dr. Laurence Ferrari notified us (November 01, 2019) that the patient was also  being worked up for a pancreatic mass.  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  In addition, ER and PR  are negative.  Gata-3 can be expressed in the pancreatic  adenocarcinomas; therefore, pancreatobiliary primary remains in the  differential.    11/02/2019 Genetic Testing   Negative genetic testing: no pathogenic variants detected in [facility] Common Hereditary Cancers Panel.  The report date is November 02, 2019.   The Common Hereditary Cancers Panel offered by [facility] includes sequencing and/or deletion duplication testing of the following 48 genes: APC, ATM, AXIN2, BARD1, BMPR1A, BRCA1, BRCA2, BRIP1, CDH1, CDK4, CDKN2A (p14ARF), CDKN2A (p16INK4a), CHEK2, CTNNA1, DICER1, EPCAM (Deletion/duplication testing only), GREM1 (promoter region deletion/duplication testing only), KIT, MEN1, MLH1, MSH2, MSH3, MSH6, MUTYH, NBN, NF1, NHTL1, PALB2, PDGFRA, PMS2, POLD1, POLE, PTEN, RAD50, RAD51C, RAD51D, RNF43, SDHB, SDHC, SDHD, SMAD4, SMARCA4. STK11, TP53, TSC1, TSC2, and VHL.  The following genes were evaluated for sequence changes only: SDHA and HOXB13 c.251G>A variant only.   11/04/2019 Imaging   CT AP  IMPRESSION: 1. Circumferential bowel wall thickening with adjacent fat stranding throughout the colon, most predominant in the transverse colon. This is consistent with colitis, which may be infectious or inflammatory in etiology. 2. Ill-defined pancreatic head mass consistent with known malignancy, similar to mildly increased in comparison to prior CT. There are innumerable hypodense masses throughout the liver, increased in conspicuity in comparison to prior CT. Findings are worrisome for worsening metastatic disease. 3. Filling defect in the LEFT  internal iliac vein, likely a thrombus with differential considerations including mixing artifact.      REVIEW OF SYSTEMS:   Constitutional: Denies fevers, chills  Respiratory: Denies cough, dyspnea or wheezes Cardiovascular: Denies palpitation, chest discomfort Gastrointestinal: Abdominal pain improved.  No nausea or vomiting reported this morning diarrhea resolved. Skin: Denies abnormal skin rashes Lymphatics: Denies new lymphadenopathy or easy bruising Neurological:Denies numbness, tingling or new weaknesses Behavioral/Psych: Mood is stable, no new changes  Extremities: No lower extremity edema All other systems were reviewed with the patient and are negative.  I have reviewed the past medical history, past surgical history, social history and family history with the patient and they are unchanged from previous note.   PHYSICAL EXAMINATION: ECOG PERFORMANCE STATUS: 3  Vitals:   12/10/19 1502 12/10/19 1510  BP:    Pulse: (!) 120 (!) 110  Resp:    Temp:    SpO2:     Filed Weights   12/09/19 0500 12/10/19 0500 12/10/19 0945  Weight: 176 lb 5.9 oz (80 kg) 176 lb 2.4 oz (79.9 kg) 176 lb 2.4 oz (79.9 kg)    Intake/Output from previous day: 11/14 0701 - 11/15 0700 In: 1999.4 [I.V.:1999.4] Out: -   GENERAL:alert, no distress and comfortable SKIN: skin color, texture, turgor are normal, no rashes or significant lesions LUNGS: clear to auscultation and percussion with normal breathing effort HEART: regular rate & rhythm and no murmurs and no lower extremity edema ABDOMEN: Positive bowel sounds, soft, nontender Musculoskeletal:no cyanosis of digits and no clubbing  NEURO: alert & oriented x 3 with fluent speech, no focal motor/sensory deficits  LABORATORY DATA:  I have reviewed the data as listed CMP Latest  Ref Rng & Units 12/10/2019 12/09/2019 12/08/2019  Glucose 70 - 99 mg/dL 112(H) 126(H) 113(H)  BUN 6 - 20 mg/dL '15 16 17  ' Creatinine 0.44 - 1.00 mg/dL 0.53 0.55 0.58   Sodium 135 - 145 mmol/L 134(L) 136 135  Potassium 3.5 - 5.1 mmol/L 4.2 3.8 3.7  Chloride 98 - 111 mmol/L 105 106 105  CO2 22 - 32 mmol/L 21(L) 23 21(L)  Calcium 8.9 - 10.3 mg/dL 8.9 8.9 8.9  Total Protein 6.5 - 8.1 g/dL 6.5 - 6.6  Total Bilirubin 0.3 - 1.2 mg/dL 0.4 - 0.5  Alkaline Phos 38 - 126 U/L 124 - 118  AST 15 - 41 U/L 52(H) - 43(H)  ALT 0 - 44 U/L 45(H) - 43    Lab Results  Component Value Date   WBC 3.3 (L) 12/10/2019   HGB 8.7 (L) 12/10/2019   HCT 27.2 (L) 12/10/2019   MCV 94.1 12/10/2019   PLT 144 (L) 12/10/2019   NEUTROABS 1.7 12/10/2019    DG Chest 2 View  Result Date: 11/24/2019 CLINICAL DATA:  Nausea and vomiting. EXAM: CHEST - 2 VIEW COMPARISON:  November 04, 2019 FINDINGS: There is stable left-sided venous Port-A-Cath positioning. The heart size and mediastinal contours are within normal limits. Both lungs are clear. The visualized skeletal structures are unremarkable. IMPRESSION: No active cardiopulmonary disease. Electronically Signed   By: Virgina Norfolk M.D.   On: 11/24/2019 18:52   DG Abd 1 View  Result Date: 12/03/2019 CLINICAL DATA:  Diffuse abdominal pain EXAM: ABDOMEN - 1 VIEW COMPARISON:  11/24/2019 FINDINGS: The bowel gas pattern is normal. No radio-opaque calculi or other significant radiographic abnormality are seen. IMPRESSION: Negative. Electronically Signed   By: Inez Catalina M.D.   On: 12/03/2019 08:21   CT Abdomen Pelvis W Contrast  Result Date: 11/24/2019 CLINICAL DATA:  Abdominal distension, pain, nausea, vomiting. Pancreatic cancer EXAM: CT ABDOMEN AND PELVIS WITH CONTRAST TECHNIQUE: Multidetector CT imaging of the abdomen and pelvis was performed using the standard protocol following bolus administration of intravenous contrast. CONTRAST:  129m OMNIPAQUE IOHEXOL 300 MG/ML  SOLN COMPARISON:  11/04/2019 FINDINGS: Lower chest: Scarring at the left lung base.  No acute abnormality. Hepatobiliary: Numerous lesions throughout the liver,  enlarging since prior study. Index right hepatic dome lesion measures 2.1 cm compared to 1.6 cm previously. Inferior right hepatic lesion measures 1.4 cm compared with 9 mm previously. Gallbladder unremarkable. Pancreas: Pancreatic head mass measuring up to 3.4 cm compared to 3.2 cm previously, likely not significantly changed. Pancreatic ductal dilatation in the body and tail. Spleen: No focal abnormality.  Normal size. Adrenals/Urinary Tract: No adrenal abnormality. No focal renal abnormality. No stones or hydronephrosis. Urinary bladder is unremarkable. Stomach/Bowel: Stomach, large and small bowel grossly unremarkable. Normal appendix. Vascular/Lymphatic: No evidence of aneurysm or adenopathy. Reproductive: No mass or abnormality noted. Other: No free fluid or free air. Musculoskeletal: No acute bony abnormality. IMPRESSION: Pancreatic head mass again noted compatible with patient's given history of pancreatic cancer. Numerous metastases throughout the liver, enlarging since prior study. Electronically Signed   By: KRolm BaptiseM.D.   On: 11/24/2019 17:02   DG UGI W SINGLE CM (SOL OR THIN BA)  Result Date: 12/06/2019 CLINICAL DATA:  Nausea and vomiting. Additional [place]. EXAM: UPPER GI SERIES WITH KUB TECHNIQUE: After obtaining a scout radiograph a routine upper GI series was performed using thin and high density barium. FLUOROSCOPY TIME:  Fluoroscopy Time:  4  minutes, 24 seconds. Radiation Exposure Index (if provided by the fluoroscopic device): 80.90 mGy Number of Acquired Spot Images: 5 COMPARISON:  CT abdomen/pelvis 11/24/2019. FINDINGS: A scout radiograph of the abdomen demonstrates a nonobstructive bowel gas pattern. Fluoroscopic evaluation demonstrates normal caliber and smooth contour of the esophagus. No evidence of fixed stricture, mass or mucosal abnormality. Mild nonspecific intermittent esophageal dysmotility. No hiatal  hernia. No gastroesophageal reflux was observed. The patient swallowed a 13 mm barium tablet, which remained in the distal esophagus despite a prolonged period of observation. Normal appearance of the stomach, duodenal bulb and duodenal sweep. No evidence of ulceration, fold thickening or mass. IMPRESSION: A swallowed 13 mm barium tablet remained in the distal esophagus despite a prolonged period of observation. However, there is no appreciable distal esophageal stricture. Mild nonspecific esophageal dysmotility. Otherwise unremarkable upper GI series, as described. Electronically Signed   By: Kellie Simmering DO   On: 12/06/2019 13:54    ASSESSMENT AND PLAN: 1.  Metastatic pancreatic adenocarcinoma 2.  Intractable nausea and vomiting secondary to chemotherapy and cancer, persistent and refractory  3.  Dehydration, improved 4.  Anemia secondary to chemotherapy 5.  Left iliac vein DVT 6.  Lupus 7.  Hypertension 8.  Migraines 9.  Anxiety 10 persistent hypokalemia, resolved  Plan -EGD showed no esophageal stricture or gastric/duodenal obstruction.  A small esophageal ulcer and gastritis. -I encouraged her to try oral medication, which is a key for her going home  -Okay to try applesauce, jello etc.  -I hope she can go home with TPN in a few days if she can tolerate oral meds . She has many questions about home TPN, please set up education with pt and her husband if possible    Truitt Merle  12/10/2019

## 2019-12-10 NOTE — Op Note (Signed)
[facility] Patient Name: Cynthia Frye Procedure Date: 12/10/2019 MRN: [id number] Attending MD: Lear Ng , MD Date of Birth: December 05, 1960 CSN: [id number] Age: 59 Admit Type: Inpatient Procedure:                Upper GI endoscopy Indications:              Dysphagia, Abnormal UGI series Providers:                Lear Ng, MD, Wynonia Sours, RN, Tyrone Apple, Technician, Jefm Miles CRNA Referring MD:             hospital team Medicines:                Propofol per Anesthesia, Monitored Anesthesia Care Complications:            No immediate complications. Estimated Blood Loss:     Estimated blood loss was minimal. Procedure:                Pre-Anesthesia Assessment:                           - Prior to the procedure, a History and Physical                            was performed, and patient medications and                            allergies were reviewed. The patient's tolerance of                            previous anesthesia was also reviewed. The risks                            and benefits of the procedure and the sedation                            options and risks were discussed with the patient.                            All questions were answered, and informed consent                            was obtained. Prior Anticoagulants: The patient has                            taken no previous anticoagulant or antiplatelet                            agents. ASA Grade Assessment: III - A patient with                            severe systemic disease. After reviewing the risks  and benefits, the patient was deemed in                            satisfactory condition to undergo the procedure.                           After obtaining informed consent, the endoscope was                            passed under direct vision. Throughout the                            procedure, the  patient's blood pressure, pulse, and                            oxygen saturations were monitored continuously. The                            GIF-H190 ([id number]) Olympus gastroscope was                            introduced through the mouth, and advanced to the                            second part of duodenum. The upper GI endoscopy was                            accomplished without difficulty. The patient                            tolerated the procedure well. Scope In: Scope Out: Findings:      The Z-line was regular and was found 42 cm from the incisors.      One superficial esophageal ulcer with stigmata of recent bleeding was       found at the gastroesophageal junction. The lesion was 4 mm in largest       dimension.      The exam of the esophagus was otherwise normal.      There is no endoscopic evidence of stricture in the entire esophagus.      Segmental moderate inflammation characterized by congestion (edema) and       erythema was found in the gastric antrum. Biopsies were taken with a       cold forceps for histology. Estimated blood loss was minimal.      The cardia and gastric fundus were normal on retroflexion.      Segmental moderate mucosal changes characterized by congestion and       erythema were found in the duodenal bulb. Biopsies were taken with a       cold forceps for histology. Estimated blood loss was minimal.      The second portion of the duodenum was normal. Impression:               - Z-line regular, 42 cm from the incisors.                           -  Esophageal ulcer with stigmata of recent bleeding.                           - Gastritis. Biopsied.                           - Mucosal changes in the duodenum. Biopsied.                           - Normal second portion of the duodenum.                           - Gastric and duodenal thickening concerning for                            inflammation from mets or due to mets from known                             pancreatic cancer. Moderate Sedation:      Not Applicable - Patient had care per Anesthesia. Recommendation:           - Advance diet as tolerated and clear liquid diet.                           - Await pathology results. Procedure Code(s):        --- Professional ---                           [phone], Esophagogastroduodenoscopy, flexible,                            transoral; with biopsy, single or multiple Diagnosis Code(s):        --- Professional ---                           R13.10, Dysphagia, unspecified                           R93.3, Abnormal findings on diagnostic imaging of                            other parts of digestive tract                           K22.11, Ulcer of esophagus with bleeding                           K29.70, Gastritis, unspecified, without bleeding                           K31.89, Other diseases of stomach and duodenum CPT copyright 2019 American Medical Association. All rights reserved. The codes documented in this report are preliminary and upon coder review may  be revised to meet current compliance requirements. Lear Ng, MD 12/10/2019 10:55:17 AM This report has been signed electronically. Number of Addenda: 0

## 2019-12-10 NOTE — Anesthesia Preprocedure Evaluation (Addendum)
Anesthesia Evaluation  Patient identified by MRN, date of birth, ID band Patient awake    Reviewed: Allergy & Precautions, NPO status , Patient's Chart, lab work & pertinent test results, reviewed documented beta blocker date and time   Airway Mallampati: III  TM Distance: >3 FB Neck ROM: Full    Dental no notable dental hx. (+) Teeth Intact, Dental Advisory Given   Pulmonary asthma ,    Pulmonary exam normal breath sounds clear to auscultation       Cardiovascular hypertension, Pt. on home beta blockers and Pt. on medications negative cardio ROS Normal cardiovascular exam Rhythm:Regular Rate:Normal  TTE 2018 - Left ventricle: The cavity size was normal. Systolic function was normal. The estimated ejection fraction was in the range of 55%to 60%. Wall motion was normal; there were no regional wallmotion abnormalities. Left ventricular diastolic function parameters were normal.  - Aortic valve: There was trivial regurgitation.  - Atrial septum: No defect or patent foramen ovale was identified.  - Pulmonary arteries: PA peak pressure: 32 mm Hg (S).  [place] 2020 Conclusions: 1.No angiographically significant coronary artery disease. 2.Normal left ventricular systolic function and filling pressure. Recommendations: 1.Primary prevention of coronary artery disease. 2.Abnormal stress test may be false positive or potentially related to small vessel disease. Continue with medical therapy.    Neuro/Psych  Headaches, Seizures -, Well Controlled,  PSYCHIATRIC DISORDERS Anxiety    GI/Hepatic negative GI ROS, Neg liver ROS,   Endo/Other  negative endocrine ROSLupus  Renal/GU negative Renal ROS  negative genitourinary   Musculoskeletal  (+) Arthritis ,   Abdominal   Peds  Hematology  (+) Blood dyscrasia (Hgb 8.7), anemia ,   Anesthesia Other Findings metastatic pancreatic adenocarcinoma with mets to the liver who presents with  worsening nausea and vomiting  On TPN  Reproductive/Obstetrics                            Anesthesia Physical Anesthesia Plan  ASA: III  Anesthesia Plan: MAC   Post-op Pain Management:    Induction: Intravenous  PONV Risk Score and Plan: Propofol infusion and Treatment may vary due to age or medical condition  Airway Management Planned: Natural Airway  Additional Equipment:   Intra-op Plan:   Post-operative Plan:   Informed Consent: I have reviewed the patients History and Physical, chart, labs and discussed the procedure including the risks, benefits and alternatives for the proposed anesthesia with the patient or authorized representative who has indicated his/her understanding and acceptance.     Dental advisory given  Plan Discussed with: CRNA  Anesthesia Plan Comments:         Anesthesia Quick Evaluation

## 2019-12-11 LAB — GLUCOSE, CAPILLARY
Glucose-Capillary: 108 mg/dL — ABNORMAL HIGH (ref 70–99)
Glucose-Capillary: 114 mg/dL — ABNORMAL HIGH (ref 70–99)
Glucose-Capillary: 115 mg/dL — ABNORMAL HIGH (ref 70–99)
Glucose-Capillary: 115 mg/dL — ABNORMAL HIGH (ref 70–99)
Glucose-Capillary: 116 mg/dL — ABNORMAL HIGH (ref 70–99)
Glucose-Capillary: 128 mg/dL — ABNORMAL HIGH (ref 70–99)

## 2019-12-11 LAB — BASIC METABOLIC PANEL
Anion gap: 8 (ref 5–15)
BUN: 16 mg/dL (ref 6–20)
CO2: 21 mmol/L — ABNORMAL LOW (ref 22–32)
Calcium: 8.7 mg/dL — ABNORMAL LOW (ref 8.9–10.3)
Chloride: 105 mmol/L (ref 98–111)
Creatinine, Ser: 0.57 mg/dL (ref 0.44–1.00)
GFR, Estimated: >60 mL/min (ref >60)
Glucose, Bld: 126 mg/dL — ABNORMAL HIGH (ref 70–99)
Potassium: 3.7 mmol/L (ref 3.5–5.1)
Sodium: 134 mmol/L — ABNORMAL LOW (ref 135–145)

## 2019-12-11 LAB — SURGICAL PATHOLOGY

## 2019-12-11 LAB — MAGNESIUM: Magnesium: 2.2 mg/dL (ref 1.7–2.4)

## 2019-12-11 MED ORDER — TRAVASOL 10 % IV SOLN
INTRAVENOUS | Status: AC
  Filled 2019-12-11: qty 1188

## 2019-12-11 MED ORDER — SENNOSIDES-DOCUSATE SODIUM 8.6-50 MG PO TABS
1.0000 | ORAL_TABLET | Freq: Two times a day (BID) | ORAL | Status: DC
  Administered 2019-12-11 – 2019-12-12 (×3): 1 via ORAL
  Filled 2019-12-11 (×3): qty 1

## 2019-12-11 MED ORDER — INSULIN ASPART 100 UNIT/ML ~~LOC~~ SOLN: 0.0000 [IU] | SUBCUTANEOUS | Status: DC

## 2019-12-11 MED ORDER — METOPROLOL SUCCINATE ER 50 MG PO TB24
50.0000 mg | ORAL_TABLET | Freq: Every day | ORAL | Status: DC
  Administered 2019-12-11 – 2019-12-12 (×2): 50 mg via ORAL
  Filled 2019-12-11 (×2): qty 1

## 2019-12-11 NOTE — TOC Progression Note (Signed)
Transition of Care [facility]) - Progression Note    Patient Details  Name: NAUDIA CROSLEY MRN: [id number] Date of Birth: 06-19-1960  Transition of Care [facility]) CM/SW Contact  Eleisha Branscomb, Marjie Skiff, RN Phone Number: 12/11/2019, 11:52 AM  Clinical Narrative:    TOC consult for home TPN. Met with pt at bedside and offered choice for home infusion company. [employer] chosen. [employer] liaison Pam contacted for referral. TOC will continue to follow.   Expected Discharge Plan: [place] Barriers to Discharge: Continued Medical Work up  Expected Discharge Plan and Services Expected Discharge Plan: [place]   Discharge Planning Services: CM Consult Post Acute Care Choice: Home Health (Home IV infusion) Living arrangements for the past 2 months: Single Family Home                           HH Arranged: RN (IV TPN) HH Agency: [facility] Date HH Agency Contacted: 12/11/19   Representative spoke with at [place]: [place] ([place]) Interventions    Readmission Risk Interventions Readmission Risk Prevention Plan 12/11/2019 11/27/2019  Transportation Screening - Complete  HRI or [place] - Complete  Social Work Consult for [place] Planning/Counseling - Complete  Palliative Care Screening - Not Applicable  Medication Review Press photographer) - Complete  PCP or Specialist appointment within 3-5 days of discharge Complete -  [place] or [place] Complete -  SW Recovery Care/Counseling Consult Complete -  Palliative Care Screening Not Applicable -  [place] Not Applicable -  Some recent data might be hidden

## 2019-12-11 NOTE — Progress Notes (Signed)
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  Also found to be significantly hypokalemic on presentation.  CT abdomen/pelvis was negative for SBO on presentation.  Urinalysis was concerning for UTI.  Due to persistent poor oral intake, she underwent TPN initiation.  Prolonged hospital course.  GI consulted and underwent EGD on 12/10/19.  Now the plan is for discharge to home with TPN tomorrow   Assessment & Plan:   Principal Problem:   Hypokalemia Active Problems:   Essential hypertension   Lupus (systemic lupus erythematosus) (HCC)   Seasonal allergies   Chronic migraine without aura without status migrainosus, not intractable   Pancreatic cancer (HCC)   Acute deep vein thrombosis of left iliac vein (HCC)   Nausea and vomiting in adult   Dehydration   Prolonged QT interval   Emesis   Tachycardia   Anemia associated with chemotherapy   Constipation   Generalized abdominal pain   Malnutrition of moderate degree   Dysphagia   Intractable nausea/vomiting/dehydration: Much improved now.Continue antiemetics as needed.  Also started on Zyprexa for persistent nausea. On PPI.  Started on TPN due to persistent poor oral intake.  GI consulted.  She underwent EGD on 12/10/19.  Found to have esophageal ulcer with stigmata of recent bleeding, gastritis, gastric/duodenal thickening concerning for inflammation from mets or due to mets from pancreatic cancer. GI cleared her for starting soft diet.  We shd plan for discharge to home with TPN.  Pancreatic adenocarcinoma with multiple liver lesions: Diagnosed in August , 2021.  Follows with Dr. Burr Medico.  On chemotherapy  Sinus tachycardia:  Currently stable.  Continue Toprol  Electrolyte abnormalities:We will continue to monitor and supplement  QTc prolongation: Resolved  Suspected UTI: Completed 3 days of IV Rocephin.  Normocytic anemia: Currently hemoglobin stable in the range of 8  Left leg DVT: On Lovenox at home.  DVT most likely associated with malignancy.  History of lupus: Follows with rheumatology.  On methotrexate injection weekly, Plaquenil        Nutrition Problem: Moderate Malnutrition Etiology: chronic illness, cancer and cancer related treatments      DVT prophylaxis:Lovenox Code Status:Full  Family Communication: called husband on phone, call not received Status is: Inpatient  Remains inpatient appropriate because:Inpatient level of care appropriate due to severity of illness   Dispo: The patient is from: Home              Anticipated d/c is to: Home              Anticipated d/c date is: Tomorrow              Patient currently is not medically stable to d/c.    Consultants: GI  Procedures:EGD  Antimicrobials:  Anti-infectives (From admission, onward)   Start     Dose/Rate Route Frequency Ordered Stop   11/24/19 2000  cefTRIAXone (ROCEPHIN) 1 g in sodium chloride 0.9 % 100 mL IVPB  Status:  Discontinued        1 g 200 mL/hr over 30 Minutes Intravenous Every 24 hours 11/24/19 1912 11/27/19 0759      Subjective:  Patient seen and examined at the bedside this morning.  Hemodynamically stable.  Looks very comfortable.  Denies any nausea,  vomiting or abdominal pain.  She was eager to advance her diet.  Objective: Vitals:   12/10/19 2116 12/10/19 2136 12/10/19 2242 12/11/19 0536  BP: 133/78   129/81  Pulse: (!) 118 (!) 128  (!) 122  Resp: 19   16  Temp: 100.1 F (37.8 C)  99.6 F (37.6 C) 100 F (37.8 C)  TempSrc: Oral   Oral  SpO2: 100%   99%  Weight:      Height:        Intake/Output Summary (Last 24 hours) at 12/11/2019 0843 Last data filed at 12/11/2019 0400 Gross  per 24 hour  Intake 2057.23 ml  Output --  Net 2057.23 ml   Filed Weights   12/09/19 0500 12/10/19 0500 12/10/19 0945  Weight: 80 kg 79.9 kg 79.9 kg    Examination:  General exam: Appears calm and comfortable ,Not in distress,average built HEENT:PERRL,Oral mucosa moist, Ear/Nose normal on gross exam Respiratory system: Bilateral equal air entry, normal vesicular breath sounds, no wheezes or crackles  Cardiovascular system: S1 & S2 heard, RRR. No JVD, murmurs, rubs, gallops or clicks.  Chemo-Port on the left chest Gastrointestinal system: Abdomen is nondistended, soft and nontender. No organomegaly or masses felt. Normal bowel sounds heard. Central nervous system: Alert and oriented. No focal neurological deficits. Extremities: No edema, no clubbing ,no cyanosis Skin: No rashes, lesions or ulcers,no icterus ,no pallor   Data Reviewed: I have personally reviewed following labs and imaging studies  CBC: Recent Labs  Lab 12/05/19 0615 12/10/19 0725  WBC 3.2* 3.3*  NEUTROABS 1.7 1.7  HGB 9.1* 8.7*  HCT 27.6* 27.2*  MCV 92.6 94.1  PLT 169 062*   Basic Metabolic Panel: Recent Labs  Lab 12/06/19 0833 12/06/19 0833 12/07/19 0517 12/08/19 0502 12/09/19 0623 12/10/19 0725 12/11/19 0619  NA 138   < > 138 135 136 134* 134*  K 4.2   < > 3.6 3.7 3.8 4.2 3.7  CL 105   < > 106 105 106 105 105  CO2 26   < > 24 21* 23 21* 21*  GLUCOSE 98   < > 116* 113* 126* 112* 126*  BUN 5*   < > 12 17 16 15 16   CREATININE 0.54   < > 0.53 0.58 0.55 0.53 0.57  CALCIUM 9.6   < > 9.3 8.9 8.9 8.9 8.7*  MG 1.9   < > 1.7 1.9 1.9 1.8 2.2  PHOS 4.3  --  3.5 2.6 3.1 3.6  --    < > = values in this interval not displayed.   GFR: Estimated Creatinine Clearance: 83.4 mL/min (by C-G formula based on SCr of 0.57 mg/dL). Liver Function Tests: Recent Labs  Lab 12/05/19 0615 12/06/19 0833 12/07/19 0517 12/08/19 0502 12/10/19 0725  AST 56* 51* 49* 43* 52*  ALT 49* 49* 48* 43 45*  ALKPHOS 107 118  130* 118 124  BILITOT 0.6 0.6 0.7 0.5 0.4  PROT 6.4* 6.9 6.5 6.6 6.5  ALBUMIN 3.0* 3.1* 3.1* 3.0* 2.9*   No results for input(s): LIPASE, AMYLASE in the last 168 hours. No results for input(s): AMMONIA in the last 168 hours. Coagulation Profile: No results for input(s): INR, PROTIME in the last 168 hours. Cardiac Enzymes: No results for input(s): CKTOTAL, CKMB, CKMBINDEX, TROPONINI in the last 168 hours. BNP (last 3 results) No results for input(s): PROBNP in the last 8760 hours. HbA1C: No results for input(s): HGBA1C in the last 72 hours. CBG: Recent Labs  Lab 12/10/19  4388 12/10/19 1158 12/10/19 1844 12/11/19 0019 12/11/19 0533  GLUCAP 128* 97 116* 128* 116*   Lipid Profile: Recent Labs    12/10/19 0725  TRIG 54   Thyroid Function Tests: No results for input(s): TSH, T4TOTAL, FREET4, T3FREE, THYROIDAB in the last 72 hours. Anemia Panel: No results for input(s): VITAMINB12, FOLATE, FERRITIN, TIBC, IRON, RETICCTPCT in the last 72 hours. Sepsis Labs: No results for input(s): PROCALCITON, LATICACIDVEN in the last 168 hours.  No results found for this or any previous visit (from the past 240 hour(s)).       Radiology Studies: No results found.      Scheduled Meds: . Chlorhexidine Gluconate Cloth  6 each Topical Daily  . enoxaparin (LOVENOX) injection  120 mg Subcutaneous Q24H  . fluticasone  2 spray Each Nare Daily  . insulin aspart  0-9 Units Subcutaneous 4 times per day on Mon Tue Wed Thu Fri Sat  . lipase/protease/amylase  24,000 Units Oral TID WC  . metoprolol tartrate  5 mg Intravenous Q6H  . OLANZapine  7.5 mg Oral QHS  . pantoprazole  40 mg Oral Q1200  . polyethylene glycol  17 g Oral Daily  . sodium chloride flush  10-40 mL Intracatheter Q12H  . sodium chloride flush  3 mL Intravenous Q12H  . sucralfate  1 g Oral TID WC & HS  . topiramate  50 mg Oral QHS   Continuous Infusions: . dextrose 5 % and 0.9% NaCl 10 mL/hr at 12/10/19 0056  . TPN ADULT  (ION) 90 mL/hr at 12/11/19 0400  . TPN CYCLIC-ADULT (ION)       LOS: 16 days    Time spent: 35 mins.More than 50% of that time was spent in counseling and/or coordination of care.      Shelly Coss, MD [facility] P11/16/2021, 8:43 AM

## 2019-12-11 NOTE — Progress Notes (Signed)
Chaplain came by in response to spiritual consult.  Patient's door was closed and voices within.  Chaplain aware from [facility] that patient had had few visitors and she needed visits. Chaplain hoped that a visitor was with her, but it could be staff.  Chaplain Corbin Hott will refer patient to staff chaplain [facility]. Rev. Tamsen Snider Pager [phone]

## 2019-12-11 NOTE — Progress Notes (Signed)
Consulted with GI and left a message to try to fulfill patient request of advancing diet. Informed Dr. Michail Sermon is the gastroenterologist that is working with this patient. GI consult completed and order has been carried out for patient.

## 2019-12-11 NOTE — Progress Notes (Signed)
Nutrition Follow-up  DOCUMENTATION CODES:   Non-severe (moderate) malnutrition in context of chronic illness  INTERVENTION:   -Cyclic TPN management per Pharmacy   NUTRITION DIAGNOSIS:   Moderate Malnutrition related to chronic illness, cancer and cancer related treatments as evidenced by percent weight loss, energy intake < or equal to 75% for > or equal to 1 month.  Ongoing.  GOAL:   Patient will meet greater than or equal to 90% of their needs  Meeting with TPN  MONITOR:   PO intake, Supplement acceptance, Labs, Weight trends, I & O's  ASSESSMENT:   59 year old unfortunate female history of recently diagnosed metastatic pancreatic adenocarcinoma with mets to the liver 09/19/2019, currently undergoing chemotherapy, recent chemotherapy 11/21/2019 presenting with 1 day history of worsening nausea, vomiting, abdominal pain.  10/30: admitted 11/10: TPN initiated 11/15: s/p EGD: small GEJ ulcer, duodenal and gastric inflammation from mets  TPN now being transitioned to cyclic as pt is expected to go home on TPN d/t continually unable to tolerate PO. Now on a soft diet.  Admission weight: 178 lbs. Current weight: 176 lbs.  Medications: Creon, Miralax, Senokot, Carafate  Labs reviewed: CBGs: 115-116 Low Na  Diet Order:   Diet Order            DIET SOFT Room service appropriate? Yes; Fluid consistency: Thin  Diet effective now                 EDUCATION NEEDS:   No education needs have been identified at this time  Skin:  Skin Assessment: Reviewed RN Assessment  Last BM:  11/14-type 1  Height:   Ht Readings from Last 1 Encounters:  12/10/19 5\' 7"  (1.702 m)    Weight:   Wt Readings from Last 1 Encounters:  12/10/19 79.9 kg   BMI:  Body mass index is 27.59 kg/m.  Estimated Nutritional Needs:   Kcal:  2200-2400  Protein:  115-125g  Fluid:  2L/day  Clayton Bibles, MS, RD, LDN Inpatient Clinical Dietitian Contact information available via  Amion

## 2019-12-11 NOTE — Progress Notes (Signed)
Findings from yesterday's endoscopy reviewed.  Specifically, the absence of esophageal stricturing was noted.  Meanwhile, the patient is planned for home TNA.  Recommendation:  1.  I advised the patient to avoid acidic beverages, such as orange juice, if they cause esophageal burning in view of the esophageal ulceration  2.  I suggested that the patient avoid ice cold beverages, particularly when taking pills, since this may exacerbate impairment of lower esophageal sphincter relaxation, leading to a pill impaction (which may be what caused the esophageal ulceration)  3.  I informed the patient of the fact there may be extrinsic impingement or infiltration of the stomach and duodenum from her pancreatic cancer, based on the endoscopic findings, but that there was no obvious obstruction due to this, nor anything that required endoscopic intervention.  4.  The patient sometimes has oropharyngeal dysphagia to pills; I instructed her in the chin tuck maneuver to help with that.  5.  Since the patient will have home TNA, I advised her that eating for her can be "recreational" and that she does not have to eat any particular type or amount of food to maintain nutrition.  She has requested an upgrade of her diet to a soft diet, which is fine from my standpoint.  We will sign off at this time.  However, if we can be of further assistance in her care, or questions arise that we can help with, do not hesitate to call us back.  Cleotis Nipper, M.D. Pager [phone] If no answer or after 5 PM call [phone]

## 2019-12-11 NOTE — Progress Notes (Signed)
PHARMACY - TOTAL PARENTERAL NUTRITION CONSULT NOTE   Indication: intolerance to enteral feeding  Patient Measurements: Height: 5\' 7"  (170.2 cm) Weight: 79.9 kg (176 lb 2.4 oz) IBW/kg (Calculated) : 61.6 TPN AdjBW (KG): 66.2 Body mass index is 27.59 kg/m. Usual Weight:   Assessment: Patient is a 59 y.o. F with metastatic pancreatic cancer on chemotherapy PTA presented to the ED on 10/30 with c/o abdominal pain and n/v. With patient unable to tolerate oral intake, pharmacy is consulted to start TPN for nutrition.  Glucose / Insulin: no hx DM; on sensitive SSI q6h (1 units/ 24 hrs) - CBGs (goal <150) Electrolytes: K 3.7, Mag 2.2, CorrCa 9.58 (Ca removed in TPN), CO2 slightly low 21 Renal: SCr stable, BUN wnl  LFTs / TGs: yesterday AST/ALT up 52/45, Tbili wnl - TG 64 (11/10), 54 (11/15) Prealbumin / albumin: prealbumin 9 (11/10), 8.8 (11/15), albumin 2.9 Intake / Output; MIVF: I/O inaccurate.  UOP volume not charted, Urine occurrence x1 yesterday. - D5NS at 10 ml/hr GI Imaging: - 11/8 abd xray: negative - 11/11 A swallowed 13 mm barium tablet remained in the distal esophagus despite a prolonged period of observation Surgeries / Procedures:  - EGD 11/15 shows esophageal ulcer with stigmata of recent bleeding, gastritis, gastric/duodenal thickening concerning for inflammation from mets or due to metsfrom pancreatic cancer.  Central access: port-a cath TPN start date: 12/05/19  Nutritional Goals (per RD recommendation on 11/9): Kcal:  2200-2400 Protein:  115-125g Fluid:  2L/day  Goal TPN rate is 90 mL/hr (provides 119 g of protein and 2372 kcals per day)  Current Nutrition:  - CLD 11/15, thin liquids - start TPN on 11/10 - Of note, patient has peanut allergy (rxns= hives). Will use SMOFlipid  Plan:   At 16:10: adjust to cyclic regimen to give 9604VW over 18hr in anticipation of home TPN (will provide 119g protein, 2224 kcal per day) - Electrolytes in TPN: increase 100  mEq/L of Na, increase 55 mEq/L of K, cont with no Ca, 81mEq/L of Mg, and 10 mmol/L of Phos. Cl:Ac ratio 1:2 - Add standard MVI and trace elements to TPN - Continue Sensitive SSI, check CBGs 4 times/day (2hr after TPN starts, 1hr after TPN dc'd, in the middle of TPN infusion and while TPN off). - Continue mIVF at 10 mL/hr - Monitor TPN labs on Mon/Thurs, CMET in AM  Peggyann Juba, PharmD, BCPS [facility] main pharmacy [phone] 12/11/2019 7:48 AM

## 2019-12-12 ENCOUNTER — Encounter (HOSPITAL_COMMUNITY): Payer: Self-pay | Admitting: Gastroenterology

## 2019-12-12 DIAGNOSIS — R1310 Dysphagia, unspecified: Secondary | ICD-10-CM | POA: Diagnosis not present

## 2019-12-12 DIAGNOSIS — R112 Nausea with vomiting, unspecified: Secondary | ICD-10-CM | POA: Diagnosis not present

## 2019-12-12 DIAGNOSIS — K529 Noninfective gastroenteritis and colitis, unspecified: Secondary | ICD-10-CM | POA: Diagnosis not present

## 2019-12-12 DIAGNOSIS — E440 Moderate protein-calorie malnutrition: Secondary | ICD-10-CM | POA: Diagnosis not present

## 2019-12-12 LAB — COMPREHENSIVE METABOLIC PANEL
ALT: 57 U/L — ABNORMAL HIGH (ref 0–44)
AST: 62 U/L — ABNORMAL HIGH (ref 15–41)
Albumin: 2.9 g/dL — ABNORMAL LOW (ref 3.5–5.0)
Alkaline Phosphatase: 151 U/L — ABNORMAL HIGH (ref 38–126)
Anion gap: 6 (ref 5–15)
BUN: 18 mg/dL (ref 6–20)
CO2: 22 mmol/L (ref 22–32)
Calcium: 8.9 mg/dL (ref 8.9–10.3)
Chloride: 106 mmol/L (ref 98–111)
Creatinine, Ser: 0.54 mg/dL (ref 0.44–1.00)
GFR, Estimated: >60 mL/min (ref >60)
Glucose, Bld: 126 mg/dL — ABNORMAL HIGH (ref 70–99)
Potassium: 4.1 mmol/L (ref 3.5–5.1)
Sodium: 134 mmol/L — ABNORMAL LOW (ref 135–145)
Total Bilirubin: 0.6 mg/dL (ref 0.3–1.2)
Total Protein: 6.5 g/dL (ref 6.5–8.1)

## 2019-12-12 LAB — GLUCOSE, CAPILLARY
Glucose-Capillary: 118 mg/dL — ABNORMAL HIGH (ref 70–99)
Glucose-Capillary: 95 mg/dL (ref 70–99)

## 2019-12-12 LAB — PHOSPHORUS: Phosphorus: 3.5 mg/dL (ref 2.5–4.6)

## 2019-12-12 LAB — MAGNESIUM: Magnesium: 1.9 mg/dL (ref 1.7–2.4)

## 2019-12-12 MED ORDER — SENNOSIDES-DOCUSATE SODIUM 8.6-50 MG PO TABS
1.0000 | ORAL_TABLET | Freq: Two times a day (BID) | ORAL | 1 refills | Status: DC
Start: 2019-12-12 — End: 2020-03-02

## 2019-12-12 MED ORDER — OLANZAPINE 7.5 MG PO TABS: 7.5000 mg | ORAL_TABLET | Freq: Every day | ORAL | 0 refills | Status: DC

## 2019-12-12 MED ORDER — DICLOFENAC SODIUM 75 MG PO TBEC
75.0000 mg | DELAYED_RELEASE_TABLET | Freq: Two times a day (BID) | ORAL | Status: DC | PRN
Start: 2019-12-12 — End: 2020-03-02

## 2019-12-12 MED ORDER — TRAVASOL 10 % IV SOLN
INTRAVENOUS | Status: DC
  Filled 2019-12-12: qty 1188

## 2019-12-12 MED ORDER — PANCRELIPASE (LIP-PROT-AMYL) 24000-76000 UNITS PO CPEP: 24000.0000 [IU] | ORAL_CAPSULE | Freq: Three times a day (TID) | ORAL | 0 refills | Status: DC

## 2019-12-12 MED ORDER — SUCRALFATE 1 GM/10ML PO SUSP
1.0000 g | Freq: Three times a day (TID) | ORAL | 3 refills | Status: DC
Start: 2019-12-12 — End: 2020-03-02

## 2019-12-12 MED ORDER — INSULIN ASPART 100 UNIT/ML ~~LOC~~ SOLN: 0.0000 [IU] | SUBCUTANEOUS | Status: DC

## 2019-12-12 MED ORDER — POLYETHYLENE GLYCOL 3350 17 G PO PACK
17.0000 g | PACK | Freq: Every day | ORAL | 1 refills | Status: DC
Start: 2019-12-13 — End: 2020-01-18

## 2019-12-12 NOTE — Progress Notes (Signed)
Occupational Therapy Progress Note  Patient with questions regarding DME recommendations for home. Educate patient on benefits of shower chair and different models, provide patient with visual aid to help decide what would be most appropriate for her tub/shower. Patient very appreciative and declined any further questions regarding DME/ADL needs at home. Notified CM on DME recommendations.    12/12/19 1300  OT Visit Information  Last OT Received On 12/12/19  Assistance Needed +1  History of Present Illness 59 year old unfortunate female history of recently diagnosed metastatic pancreatic adenocarcinoma with mets to the liver 09/19/2019, currently undergoing chemotherapy, recent chemotherapy 11/21/2019 presenting with 1 day history of worsening nausea, vomiting, abdominal pain.  Noted to have significant hypokalemia, admitted to [facility]  Precautions  Precautions Other (comment)  Precaution Comments monitor HR  Pain Assessment  Pain Assessment No/denies pain  Cognition  Arousal/Alertness Awake/alert  Behavior During Therapy WFL for tasks assessed/performed  Overall Cognitive Status Within Functional Limits for tasks assessed  ADL  General ADL Comments patient had questions regarding DME for home. educate patient on different types of shower chairs available and provide visual, educate patient on benefits of stool vs shower chair with back. contacted case manager re: shower chair recommendations.   Bed Mobility  General bed mobility comments in chair upon arrival  Transfers  General transfer comment deferred, patient just up with PT  OT - End of Session  Activity Tolerance Patient tolerated treatment well  Patient left in chair;with call bell/phone within reach  Nurse Communication Other (comment) (CM DME needs)  OT Assessment/Plan  OT Plan Discharge plan remains appropriate  OT Visit Diagnosis Muscle weakness (generalized) (M62.81)  OT Frequency (ACUTE ONLY) Min 2X/week  Follow Up  Recommendations No OT follow up;Supervision - Intermittent  OT Equipment Tub/shower seat  AM-PAC OT "6 Clicks" Daily Activity Outcome Measure (Version 2)  Help from another person eating meals? 4  Help from another person taking care of personal grooming? 4  Help from another person toileting, which includes using toliet, bedpan, or urinal? 4  Help from another person bathing (including washing, rinsing, drying)? 4  Help from another person to put on and taking off regular upper body clothing? 4  Help from another person to put on and taking off regular lower body clothing? 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

## 2019-12-12 NOTE — TOC Transition Note (Signed)
Transition of Care [facility]) - CM/SW Discharge Note   Patient Details  Name: Cynthia Frye MRN: [id number] Date of Birth: 03-21-1960  [facility]) CM/SW Contact:  Lynnell Catalan, RN Phone Number: 12/12/2019, 12:40 PM   Clinical Narrative:     Pt to dc home today with [facility] for home infusion services (TPN) and [facility] for home health RN. RW ordered for pt from [facility]. Shower seat from [facility] too big for pt's tub at home. She plans to buy one at [facility].  Final next level of care: [place] Barriers to Discharge: Continued Medical Work up   Patient Goals and CMS Choice Patient states their goals for this hospitalization and ongoing recovery are:: To get home CMS Medicare.gov Compare Post Acute Care list provided to:: Patient Choice offered to / list presented to : Patient  Discharge Placement                       Discharge Plan and Services   Discharge Planning Services: CM Consult Post Acute Care Choice: Home Health (Home IV infusion)                    HH Arranged: RN (IV TPN) HH Agency: [facility] Date HH Agency Contacted: 12/11/19   Representative spoke with at [place]: [place] Determinants of Health ([place]) Interventions     Readmission Risk Interventions Readmission Risk Prevention Plan 12/11/2019 11/27/2019  Transportation Screening - Complete  HRI or [place] - Complete  Social Work Consult for [place] Planning/Counseling - Complete  Palliative Care Screening - Not Applicable  Medication Review Press photographer) - Complete  PCP or Specialist appointment within 3-5 days of discharge Complete -  [place] or [place] Complete -  SW Recovery Care/Counseling Consult Complete -  Palliative Care Screening Not Applicable -  [place] Not Applicable -  Some recent data might be hidden

## 2019-12-12 NOTE — Progress Notes (Signed)
PHARMACY - TOTAL PARENTERAL NUTRITION CONSULT NOTE   Indication: intolerance to enteral feeding  Patient Measurements: Height: 5\' 7"  (170.2 cm) Weight: 79.9 kg (176 lb 2.4 oz) IBW/kg (Calculated) : 61.6 TPN AdjBW (KG): 66.2 Body mass index is 27.59 kg/m. Usual Weight:   Assessment: Patient is a 59 y.o. F with metastatic pancreatic cancer on chemotherapy PTA presented to the ED on 10/30 with c/o abdominal pain and n/v. With patient unable to tolerate oral intake, pharmacy is consulted to start TPN for nutrition.  Glucose / Insulin: no hx DM; on sensitive SSI q6h (0 units/ 24 hrs) - CBGs (goal <150) Electrolytes: Na 134, K 4.1, Mag 1.9, CorrCa 9.78 (Ca removed in TPN) Renal: SCr stable, BUN wnl  LFTs / TGs: AST/ALT up 62/57, Tbili wnl - TG 64 (11/10), 54 (11/15) Prealbumin / albumin: prealbumin 9 (11/10), 8.8 (11/15), albumin 2.9 Intake / Output; MIVF: I/O inaccurate.  UOP volume not charted, Urine occurrence x1 yesterday. - D5NS at 10 ml/hr GI Imaging: - 11/8 abd xray: negative - 11/11 A swallowed 13 mm barium tablet remained in the distal esophagus despite a prolonged period of observation Surgeries / Procedures:  - EGD 11/15 shows esophageal ulcer with stigmata of recent bleeding, gastritis, gastric/duodenal thickening concerning for inflammation from mets or due to metsfrom pancreatic cancer.  Central access: port-a cath TPN start date: 12/05/19  Nutritional Goals (per RD recommendation on 11/16): Kcal:  2200-2400 Protein:  115-125g Fluid:  2L/day  Cyclic TPN 1941DE/08XK provides 118g protein, 2224 kcal  Current Nutrition:  - CLD 11/15, thin liquids - start TPN on 11/10 - Of note, patient has peanut allergy (rxns= hives). Will use SMOFlipid  Plan:   At 48:18: adjust to cyclic regimen to give 5631SH over 16hr in anticipation of home TPN (will provide 119g protein, 2224 kcal per day) - Electrolytes in TPN: increase 125 mEq/L of Na, 55 mEq/L of K, cont with no Ca,  68mEq/L of Mg, and 10 mmol/L of Phos. Cl:Ac ratio 1:2 - Add standard MVI and trace elements to TPN - Continue Sensitive SSI, check CBGs 4 times/day (2hr after TPN starts, 1hr after TPN dc'd, in the middle of TPN infusion and while TPN off). - Continue mIVF at 10 mL/hr - Monitor TPN labs on Mon/Thurs, CMET in AM  Peggyann Juba, PharmD, BCPS [facility] main pharmacy [phone] 12/12/2019 6:59 AM

## 2019-12-12 NOTE — Discharge Summary (Signed)
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  Also found to be significantly hypokalemic on presentation.  CT abdomen/pelvis was negative for SBO on presentation.  Urinalysis was concerning for UTI.  Due to persistent poor oral intake, she underwent TPN initiation.  Prolonged hospital course.  GI consulted and underwent EGD on 12/10/19.  Now the plan is for discharge to home with TPN today.  Currently tolerating soft diet  Following problems were addressed during hospitalization:   Intractable nausea/vomiting/dehydration: Much improved now.Continue antiemetics as needed.  Also started on Zyprexa for persistent nausea. On PPI.  Started on TPN due to persistent poor oral intake.  GI consulted.  She underwent EGD on 12/10/19.  Found to have esophageal ulcer with stigmata of recent bleeding, gastritis, gastric/duodenal thickening concerning for inflammation from mets or due to mets from pancreatic cancer. GI cleared her for starting soft diet.  She will be discharged to home with TPN.  Pancreatic adenocarcinoma with multiple liver lesions: Diagnosed in August , 2021.  Follows with Dr. Burr Medico.  On chemotherapy  Sinus tachycardia:   Continue Toprol  QTc prolongation: Resolved  Suspected UTI: Completed 3 days of IV Rocephin.  Normocytic anemia: Currently hemoglobin stable in the range of 8.Monitor as an outpatient  Left leg DVT: On Lovenox at home.  DVT most likely associated with malignancy.  History of lupus: Follows  with rheumatology.  On methotrexate injection weekly, Plaquenil    Discharge Diagnoses:  Principal Problem:   Hypokalemia Active Problems:   Essential hypertension   Lupus (systemic lupus erythematosus) (HCC)   Seasonal allergies   Chronic migraine without aura without status migrainosus, not intractable   Pancreatic cancer (HCC)   Acute deep vein thrombosis of left iliac vein (HCC)   Nausea and vomiting in adult   Dehydration   Prolonged QT interval   Emesis   Tachycardia   Anemia associated with chemotherapy   Constipation   Generalized abdominal pain   Malnutrition of moderate degree   Dysphagia    Discharge Instructions  Discharge Instructions    Diet general   Complete by: As directed    Soft   Discharge instructions   Complete by: As directed    1)Please continue TPN at home.  Restrict your diet to soft for now. 2)Take prescribed medications as instructed. 3)Follow up with gastroenterology as an outpatient.  You will be called for appointment.   Increase activity slowly   Complete by: As directed      Allergies as of 12/12/2019      Reactions   Cinnamon Other (See Comments)   Other reaction(s): Other (See Comments) Unknown  On allergy test Unknown  On allergy test   Peanut-containing Drug Products Hives   Prednisone Other (See Comments)   Interacts with another medicine she is taking   Corn Oil Hives   Corn-containing Products Hives   Other Rash   Red grapefruit and naval oranges- lips tingling and facial rash Potatoes, tomatoes, garlic, oregano/basil caused headache      Medication List    STOP taking these medications   ALPRAZolam 0.25 MG tablet Commonly known as:  XANAX   esomeprazole 10 MG packet Commonly known as: NEXIUM   Klor-Con M20 20 MEQ tablet Generic drug: potassium chloride SA   rizatriptan 10 MG disintegrating tablet Commonly known as: MAXALT-MLT     TAKE these medications   acetaminophen 500 MG tablet Commonly known  as: TYLENOL Take 1,000 mg by mouth every 6 (six) hours as needed for moderate pain.   albuterol 108 (90 Base) MCG/ACT inhaler Commonly known as: VENTOLIN HFA Inhale 2 puffs into the lungs every 6 (six) hours as needed for wheezing or shortness of breath.   Bepreve 1.5 % Soln Generic drug: Bepotastine Besilate Place 1 drop into both eyes 2 (two) times daily as needed (dry eyes).   dexamethasone 4 MG tablet Commonly known as: DECADRON Take 1 tablet (4 mg total) by mouth daily. Take 1 tablet once daily for 3 to 5 days after chemo   diclofenac 75 MG EC tablet Commonly known as: VOLTAREN Take 1 tablet (75 mg total) by mouth 2 (two) times daily as needed. What changed:   when to take this  reasons to take this   diphenoxylate-atropine 2.5-0.025 MG tablet Commonly known as: LOMOTIL Take 2 tablets by mouth 4 (four) times daily as needed for diarrhea or loose stools (use 2nd, if Imodium doesn't work. Max dose: 8 tablets/day.). What changed: reasons to take this   Diprolene 0.05 % ointment Generic drug: augmented betamethasone dipropionate Apply 1 application topically daily as needed (skin irritation).   dronabinol 2.5 MG capsule Commonly known as: MARINOL Take 1 capsule (2.5 mg total) by mouth 2 (two) times daily before a meal. What changed:   when to take this  reasons to take this   enoxaparin 120 MG/0.8ML injection Commonly known as: LOVENOX Inject 0.8 mLs (120 mg total) into the skin daily.   feeding supplement Liqd Take 237 mLs by mouth 2 (two) times daily between meals.   ferrous sulfate 325 (65 FE) MG tablet Take 325 mg by mouth daily with breakfast.   fluticasone 50 MCG/ACT nasal spray Commonly known as: FLONASE Place 2 sprays into both nostrils daily.   hydroxychloroquine 200 MG tablet Commonly known as: PLAQUENIL Take 400 mg by mouth daily.   lidocaine-prilocaine cream Commonly known as: EMLA Apply to affected area once What changed:   how much to  take  how to take this  when to take this  reasons to take this  additional instructions   loperamide 2 MG capsule Commonly known as: IMODIUM Take 2 capsules (4 mg total) by mouth 2 (two) times daily as needed for diarrhea or loose stools (Use 1st).   loratadine 10 MG tablet Commonly known as: CLARITIN Take 10 mg by mouth daily.   LORazepam 0.5 MG tablet Commonly known as: ATIVAN Take 1 tablet (0.5 mg total) by mouth every 8 (eight) hours as needed for anxiety (and anticiaptory n/v. DO NOT TAKE WITH XANAX).   metoprolol succinate 50 MG 24 hr tablet Commonly known as: TOPROL-XL Take 50 mg by mouth daily.   OLANZapine 7.5 MG tablet Commonly known as: ZYPREXA Take 1 tablet (7.5 mg total) by mouth at bedtime.   ondansetron 8 MG tablet Commonly known as: Zofran Take 1 tablet (8 mg total) by mouth 2 (two) times daily as needed. Start on day 3 after chemotherapy. What changed: reasons to take this   oxyCODONE 5 MG immediate release tablet Commonly known as: Oxy IR/ROXICODONE Take 1 tablet (5 mg total) by mouth every 6 (six) hours as needed  for severe pain.   Pancrelipase (Lip-Prot-Amyl) 24000-76000 units Cpep Take 1 capsule (24,000 Units total) by mouth 3 (three) times daily with meals.   pantoprazole 40 MG tablet Commonly known as: PROTONIX Take 40 mg by mouth daily.   polyethylene glycol 17 g packet Commonly known as: MIRALAX / GLYCOLAX Take 17 g by mouth daily. Start taking on: December 13, 2019   potassium chloride 20 MEQ/15ML (10%) Soln Take 15 mLs (20 mEq total) by mouth 2 (two) times daily.   prochlorperazine 10 MG tablet Commonly known as: COMPAZINE Take 1 tablet (10 mg total) by mouth every 6 (six) hours as needed (Nausea or vomiting). What changed: reasons to take this   Rheumate Caps Take 1 capsule by mouth daily.   senna-docusate 8.6-50 MG tablet Commonly known as: Senokot-S Take 1 tablet by mouth 2 (two) times daily.   sucralfate 1 GM/10ML  suspension Commonly known as: CARAFATE Take 10 mLs (1 g total) by mouth 4 (four) times daily -  with meals and at bedtime.   topiramate 50 MG tablet Commonly known as: Topamax Take 1 tablet (50 mg total) by mouth at bedtime.   Vitamin D 50 MCG (2000 UT) Caps Take 2,000 Units by mouth daily.       Follow-up Information    Josetta Huddle, MD. Schedule an appointment as soon as possible for a visit in 1 week(s).   Specialty: Internal Medicine Contact information: [address] [place] [address] [phone]              Allergies  Allergen Reactions  . Cinnamon Other (See Comments)    Other reaction(s): Other (See Comments) Unknown  On allergy test Unknown  On allergy test  . Peanut-Containing Drug Products Hives  . Prednisone Other (See Comments)    Interacts with another medicine she is taking  . Corn Oil Hives  . Corn-Containing Products Hives  . Other Rash    Red grapefruit and naval oranges- lips tingling and facial rash Potatoes, tomatoes, garlic, oregano/basil caused headache    Consultations:  Oncology,GI   Procedures/Studies: DG Chest 2 View  Result Date: 11/24/2019 CLINICAL DATA:  Nausea and vomiting. EXAM: CHEST - 2 VIEW COMPARISON:  November 04, 2019 FINDINGS: There is stable left-sided venous Port-A-Cath positioning. The heart size and mediastinal contours are within normal limits. Both lungs are clear. The visualized skeletal structures are unremarkable. IMPRESSION: No active cardiopulmonary disease. Electronically Signed   By: Virgina Norfolk M.D.   On: 11/24/2019 18:52   DG Abd 1 View  Result Date: 12/03/2019 CLINICAL DATA:  Diffuse abdominal pain EXAM: ABDOMEN - 1 VIEW COMPARISON:  11/24/2019 FINDINGS: The bowel gas pattern is normal. No radio-opaque calculi or other significant radiographic abnormality are seen. IMPRESSION: Negative. Electronically Signed   By: Inez Catalina M.D.   On: 12/03/2019 08:21   CT Abdomen Pelvis  W Contrast  Result Date: 11/24/2019 CLINICAL DATA:  Abdominal distension, pain, nausea, vomiting. Pancreatic cancer EXAM: CT ABDOMEN AND PELVIS WITH CONTRAST TECHNIQUE: Multidetector CT imaging of the abdomen and pelvis was performed using the standard protocol following bolus administration of intravenous contrast. CONTRAST:  180mL OMNIPAQUE IOHEXOL 300 MG/ML  SOLN COMPARISON:  11/04/2019 FINDINGS: Lower chest: Scarring at the left lung base.  No acute abnormality. Hepatobiliary: Numerous lesions throughout the liver, enlarging since prior study. Index right hepatic dome lesion measures 2.1 cm compared to 1.6 cm previously. Inferior right hepatic lesion measures 1.4 cm compared with 9 mm previously. Gallbladder  unremarkable. Pancreas: Pancreatic head mass measuring up to 3.4 cm compared to 3.2 cm previously, likely not significantly changed. Pancreatic ductal dilatation in the body and tail. Spleen: No focal abnormality.  Normal size. Adrenals/Urinary Tract: No adrenal abnormality. No focal renal abnormality. No stones or hydronephrosis. Urinary bladder is unremarkable. Stomach/Bowel: Stomach, large and small bowel grossly unremarkable. Normal appendix. Vascular/Lymphatic: No evidence of aneurysm or adenopathy. Reproductive: No mass or abnormality noted. Other: No free fluid or free air. Musculoskeletal: No acute bony abnormality. IMPRESSION: Pancreatic head mass again noted compatible with patient's given history of pancreatic cancer. Numerous metastases throughout the liver, enlarging since prior study. Electronically Signed   By: Rolm Baptise M.D.   On: 11/24/2019 17:02   DG UGI W SINGLE CM (SOL OR THIN BA)  Result Date: 12/06/2019 CLINICAL DATA:  Nausea and vomiting. Additional history obtained from [place] NUMBERHistory of pancreatic cancer. EXAM: UPPER GI SERIES WITH KUB TECHNIQUE: After obtaining a scout radiograph a routine upper GI series was performed using thin and high density  barium. FLUOROSCOPY TIME:  Fluoroscopy Time:  4 minutes, 24 seconds. Radiation Exposure Index (if provided by the fluoroscopic device): 80.90 mGy Number of Acquired Spot Images: 5 COMPARISON:  CT abdomen/pelvis 11/24/2019. FINDINGS: A scout radiograph of the abdomen demonstrates a nonobstructive bowel gas pattern. Fluoroscopic evaluation demonstrates normal caliber and smooth contour of the esophagus. No evidence of fixed stricture, mass or mucosal abnormality. Mild nonspecific intermittent esophageal dysmotility. No hiatal hernia. No gastroesophageal reflux was observed. The patient swallowed a 13 mm barium tablet, which remained in the distal esophagus despite a prolonged period of observation. Normal appearance of the stomach, duodenal bulb and duodenal sweep. No evidence of ulceration, fold thickening or mass. IMPRESSION: A swallowed 13 mm barium tablet remained in the distal esophagus despite a prolonged period of observation. However, there is no appreciable distal esophageal stricture. Mild nonspecific esophageal dysmotility. Otherwise unremarkable upper GI series, as described. Electronically Signed   By: Kellie Simmering DO   On: 12/06/2019 13:54       Subjective: Patient seen and examined at the bedside this morning.  Hemodynamically stable for discharge today  Discharge Exam: Vitals:   12/12/19 0753 12/12/19 1017  BP: 123/73 137/81  Pulse: (!) 113 (!) 110  Resp: 18 15  Temp: 98.3 F (36.8 C) 98.3 F (36.8 C)  SpO2: 100% 100%   Vitals:   12/11/19 2200 12/12/19 0511 12/12/19 0753 12/12/19 1017  BP: 119/64 121/80 123/73 137/81  Pulse: (!) 120 (!) 115 (!) 113 (!) 110  Resp: 20 16 18 15   Temp: 99.5 F (37.5 C) 98.3 F (36.8 C) 98.3 F (36.8 C) 98.3 F (36.8 C)  TempSrc: Oral Oral Oral Oral  SpO2: 99% 99% 100% 100%  Weight:      Height:        General: Pt is alert, awake, not in acute distress Cardiovascular: RRR, S1/S2 +, no rubs, no gallops Respiratory: CTA bilaterally, no  wheezing, no rhonchi Abdominal: Soft, NT, ND, bowel sounds + Extremities: no edema, no cyanosis    The results of significant diagnostics from this hospitalization (including imaging, microbiology, ancillary and laboratory) are listed below for reference.     Microbiology: No results found for this or any previous visit (from the past 240 hour(s)).   Labs: BNP (last 3 results) No results for input(s): BNP in the last 8760 hours. Basic Metabolic Panel: Recent Labs  Lab 12/07/19 0517 12/07/19 0517 12/08/19 0502 12/09/19 3354 12/10/19 0725  12/11/19 0619 12/12/19 0610  NA 138   < > 135 136 134* 134* 134*  K 3.6   < > 3.7 3.8 4.2 3.7 4.1  CL 106   < > 105 106 105 105 106  CO2 24   < > 21* 23 21* 21* 22  GLUCOSE 116*   < > 113* 126* 112* 126* 126*  BUN 12   < > 17 16 15 16 18   CREATININE 0.53   < > 0.58 0.55 0.53 0.57 0.54  CALCIUM 9.3   < > 8.9 8.9 8.9 8.7* 8.9  MG 1.7   < > 1.9 1.9 1.8 2.2 1.9  PHOS 3.5  --  2.6 3.1 3.6  --  3.5   < > = values in this interval not displayed.   Liver Function Tests: Recent Labs  Lab 12/06/19 0833 12/07/19 0517 12/08/19 0502 12/10/19 0725 12/12/19 0610  AST 51* 49* 43* 52* 62*  ALT 49* 48* 43 45* 57*  ALKPHOS 118 130* 118 124 151*  BILITOT 0.6 0.7 0.5 0.4 0.6  PROT 6.9 6.5 6.6 6.5 6.5  ALBUMIN 3.1* 3.1* 3.0* 2.9* 2.9*   No results for input(s): LIPASE, AMYLASE in the last 168 hours. No results for input(s): AMMONIA in the last 168 hours. CBC: Recent Labs  Lab 12/10/19 0725  WBC 3.3*  NEUTROABS 1.7  HGB 8.7*  HCT 27.2*  MCV 94.1  PLT 144*   Cardiac Enzymes: No results for input(s): CKTOTAL, CKMB, CKMBINDEX, TROPONINI in the last 168 hours. BNP: Invalid input(s): POCBNP CBG: Recent Labs  Lab 12/11/19 1141 12/11/19 1644 12/11/19 2024 12/11/19 2158 12/12/19 0742  GLUCAP 115* 108* 114* 115* 118*   D-Dimer No results for input(s): DDIMER in the last 72 hours. Hgb A1c No results for input(s): HGBA1C in the last 72  hours. Lipid Profile Recent Labs    12/10/19 0725  TRIG 54   Thyroid function studies No results for input(s): TSH, T4TOTAL, T3FREE, THYROIDAB in the last 72 hours.  Invalid input(s): FREET3 Anemia work up No results for input(s): VITAMINB12, FOLATE, FERRITIN, TIBC, IRON, RETICCTPCT in the last 72 hours. Urinalysis    Component Value Date/Time   COLORURINE YELLOW 11/24/2019 1832   APPEARANCEUR CLEAR 11/24/2019 1832   LABSPEC 1.025 11/24/2019 1832   PHURINE 7.0 11/24/2019 1832   GLUCOSEU NEGATIVE 11/24/2019 1832   HGBUR NEGATIVE 11/24/2019 1832   BILIRUBINUR NEGATIVE 11/24/2019 1832   KETONESUR 20 (A) 11/24/2019 1832   PROTEINUR NEGATIVE 11/24/2019 1832   NITRITE NEGATIVE 11/24/2019 1832   LEUKOCYTESUR MODERATE (A) 11/24/2019 1832   Sepsis Labs Invalid input(s): PROCALCITONIN,  WBC,  LACTICIDVEN Microbiology No results found for this or any previous visit (from the past 240 hour(s)).  Please note: You were cared for by a hospitalist during your hospital stay. Once you are discharged, your primary care physician will handle any further medical issues. Please note that NO REFILLS for any discharge medications will be authorized once you are discharged, as it is imperative that you return to your primary care physician (or establish a relationship with a primary care physician if you do not have one) for your post hospital discharge needs so that they can reassess your need for medications and monitor your lab values.    Time coordinating discharge: 40 minutes  SIGNED:   Shelly Coss, MD  [facility] 12/12/2019, 11:18 AM Pager [phone]  If 7PM-7AM, please contact night-coverage [email or web address] Password [email or web address]

## 2019-12-12 NOTE — TOC Benefit Eligibility Note (Signed)
Transition of Care [facility]) Benefit Eligibility Note    Patient Details  Name: Cynthia Frye MRN: [id number] Date of Birth: May 25, 1960   Medication/Dose: Enoxaparin 120 mg injection daily/ Lovenox not on formulary  Covered?: Yes  Tier: 3 Drug  Prescription Coverage Preferred Pharmacy: local  Spoke with Person/Company/Phone Number:: Hilda Blades [facility] [phone]  Co-Pay: 0  Prior Approval: No          Kerin Salen Phone Number: 12/12/2019, 2:05 PM

## 2019-12-12 NOTE — Progress Notes (Signed)
Physical Therapy Treatment Patient Details Name: Cynthia Frye MRN: [id number] DOB: [birth year] Today's Date: 12/12/2019    History of Present Illness 59 year old unfortunate female history of recently diagnosed metastatic pancreatic adenocarcinoma with mets to the liver 09/19/2019, currently undergoing chemotherapy, recent chemotherapy 11/21/2019 presenting with 1 day history of worsening nausea, vomiting, abdominal pain.  Noted to have significant hypokalemia, admitted to [facility]    PT Comments    The patient reports plans o Dc home. Patient  Ambulated x 200' using Rw. Patient will need a RW for home. HR at rest 121. Did not note ambulation  HR.   Follow Up Recommendations  No PT follow up     Equipment Recommendations  Rolling walker with 5" wheels    Recommendations for Other Services       Precautions / Restrictions Precautions Precautions: Other (comment) Precaution Comments: monitor HR    Mobility  Bed Mobility Overal bed mobility: Modified Independent             General bed mobility comments: in chair upon arrival  Transfers Overall transfer level: Modified independent               General transfer comment: deferred, patient just up with PT  Ambulation/Gait Ambulation/Gait assistance: Modified independent (Device/Increase time) Gait Distance (Feet): 200 Feet Assistive device: Rolling walker (2 wheeled) Gait Pattern/deviations: Step-through pattern;Decreased stride length Gait velocity: decreased   General Gait Details: no balance issues noted   Stairs             Wheelchair Mobility    Modified Rankin (Stroke Patients Only)       Balance           Standing balance support: During functional activity;No upper extremity supported Standing balance-Leahy Scale: Good                              Cognition Arousal/Alertness: Awake/alert Behavior During Therapy: WFL for tasks assessed/performed Overall Cognitive Status:  Within Functional Limits for tasks assessed                                        Exercises      General Comments        Pertinent Vitals/Pain Pain Assessment: No/denies pain    Home Living                      Prior Function            PT Goals (current goals can now be found in the care plan section) Acute Rehab PT Goals Patient Stated Goal: be able to exercise and socialize, 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 Progress towards PT goals: Progressing toward goals    Frequency    Min 3X/week      PT Plan Current plan remains appropriate    Co-evaluation              AM-PAC PT "6 Clicks" Mobility   Outcome Measure  Help needed turning from your back to your side while in a flat bed without using bedrails?: None Help needed moving from lying on your back to sitting on the side of a flat bed without using bedrails?: None Help needed moving to and from a bed to a chair (including a wheelchair)?: None Help needed standing up from a chair  using your arms (e.g., wheelchair or bedside chair)?: None Help needed to walk in hospital room?: None Help needed climbing 3-5 steps with a railing? : A Little 6 Click Score: 23    End of Session   Activity Tolerance: Patient tolerated treatment well Patient left: in chair;with call bell/phone within reach Nurse Communication: Mobility status PT Visit Diagnosis: Difficulty in walking, not elsewhere classified (R26.2)     Time: 3437-3578 PT Time Calculation (min) (ACUTE ONLY): 15 min  Charges:  $Gait Training: 8-22 mins                     [place] Pager [phone] Office [phone]    Claretha Cooper 12/12/2019, 1:46 PM

## 2019-12-12 NOTE — Progress Notes (Signed)
Patient is being discharged home and will be receiving TPN at home. Home health RN is already at patients house. Patient being discharged with port accessed to receive TPN. Patient verbalized understanding.

## 2019-12-12 NOTE — Progress Notes (Signed)
Chaplain engaged in initial visit with Cynthia Frye.  During visit, Cynthia Frye shared that she has some anxiety and reservations around going home because she still has not had a bowel movement, she still has been working to stay hydrated, and around changing and removing equipment correctly.  Chaplain uplifted how uncomfortable it can be to engage in something new and leave the hospital before feeling your best.  Cynthia Frye also noted that she has called for her husband and cousin to be educated about her medical needs in case she cannot perform those needs herself.  Chaplain affirmed the support and backup she has in place.  Chaplain noted that when she feels anxiety she makes sure to ask a lot of questions and lean into resources.    Cynthia Frye shared that her journey before today consisted of not being able to keep any food down.  She stated she is feeling a lot better.  Chaplain and Cynthia Frye also discussed being able to keep up with her medicine.  She stated at first it was very hectic as doctors were prescribing her new things for her diagnosis but that she now has a better handle on it.  She also noted that she prefers for doctors to communicate with her personally than receiving multiple MyChart messages.  She feels that it adds additional stress to what she is already going through.  Chaplain affirmed how hard it can be to receive multiple notifications at once.  Chaplain assesses Cynthia Frye's need for personal care that allows her to feel comfortable in going from one stage to the next.    Chaplain prayed with Cynthia Frye about her journey and anxiety and reservations she has for her next stage outside of the hospital.  Cynthia Frye declared that she is going to fight and is not giving up.  Chaplain affirmed her proclamations in prayer.  Chaplain offered support, and the ministries of presence and listening.   Chaplain will follow-up as needed.    12/12/19 1000  Clinical Encounter Type  Visited With Patient  Visit Type  Initial;Follow-up  Referral From Nurse  Consult/Referral To Chaplain  Spiritual Encounters  Spiritual Needs Prayer  Stress Factors  Patient Stress Factors Health changes

## 2019-12-12 NOTE — Progress Notes (Signed)
   12/12/19 0754  Assess: MEWS Score  Level of Consciousness Alert  Assess: MEWS Score  MEWS Temp 0  MEWS Systolic 0  MEWS Pulse 2  MEWS RR 0  MEWS LOC 0  MEWS Score 2  MEWS Score Color Yellow  Assess: if the MEWS score is Yellow or Red  Were vital signs taken at a resting state? Yes  Focused Assessment No change from prior assessment  Early Detection of Sepsis Score *See Row Information* Low  MEWS guidelines implemented *See Row Information* Yes  Treat  MEWS Interventions Escalated (See documentation below)  Escalate  MEWS: Escalate Yellow: discuss with charge nurse/RN and consider discussing with provider and RRT  Notify: Charge Nurse/RN  Name of Charge Nurse/RN Notified Kaitlyn RN   Date Charge Nurse/RN Notified 12/12/19  Time Charge Nurse/RN Notified [phone]  Document  Patient Outcome Stabilized after interventions  Progress note created (see row info) Yes

## 2019-12-13 ENCOUNTER — Other Ambulatory Visit: Payer: Self-pay | Admitting: Hematology

## 2019-12-13 ENCOUNTER — Other Ambulatory Visit: Payer: Self-pay | Admitting: Nurse Practitioner

## 2019-12-14 NOTE — Progress Notes (Signed)
[facility]   [phone] [phone]   Clinic Follow up Note   Patient Care Team: Josetta Huddle, MD as PCP - General (Internal Medicine) Jonnie Finner, RN as Oncology Nurse Navigator Truitt Merle, MD as Consulting Physician (Oncology) Arta Silence, MD as Consulting Physician (Gastroenterology) [name], MD as Consulting Physician (General Surgery)  Date of Service:  12/18/2019  CHIEF COMPLAINT: f/u of Pancreatic Cancer  SUMMARY OF ONCOLOGIC HISTORY: Oncology History Overview Note  Cancer Staging Pancreatic cancer [facility]) Staging form: Exocrine Pancreas, AJCC 8th Edition - Clinical stage from 10/18/2019: Stage IV (cT3, cN1, cM1) - Signed by Truitt Merle, MD on 10/18/2019    Pancreatic cancer ([place])  09/19/2019 Imaging   CT AP w contrast 09/19/19  IMPRESSION: 1. Findings are highly concerning for probable primary pancreatic adenocarcinoma in the anterior aspect of the pancreatic head. Several prominent borderline enlarged lymph nodes are noted in the hepatoduodenal nodal station, and there are multiple indeterminate liver lesions which are highly concerning for probable hepatic metastases. Further evaluation with nonemergent abdominal MRI with and without IV gadolinium with MRCP is recommended in the near future to better evaluate these findings.   09/25/2019 Procedure   Upper EUS by Dr Paulita Fujita  IMPRESSION -There was no evidence of significant pathology in the left lobe of the liver.  -A few lymph nodes were visualized and measures in the peripancreatic region and porta hepata region.  -Hyperchoic material consistent with sludge was visualized endosonographically in the gallbladder.  -There was no sign of significant pathology in the common bile duct.  -A mass was identified in the pancreatic head. If biopsy results show adenocarcinoma, it would be staged T3N1Mx by endosonographic criteria. Fine needle aspiration performed.    09/25/2019  Initial Biopsy   FINAL MICROSCOPIC DIAGNOSIS: Fine needle aspirate, Pancreas;  MALIGNANT CELLS PRESENT CONSISTENT WITH ADENOCARCINOMA.    10/02/2019 Initial Diagnosis   Pancreatic cancer ([place])   10/11/2019 Procedure   PAC placement y Dr Barry Dienes    10/15/2019 Imaging   CT Chest  IMPRESSION: 1. Small anterior left pneumothorax with dependent atelectasis in the left lower lobe. 2. Increased number of bilateral axillary and subpectoral lymph nodes with mild lymphadenopathy in the left axilla. While this would be an atypical presentation for metastatic pancreatic cancer, this possibility is not excluded 3. Main duct dilatation in the pancreas, better assessed on abdomen CT 09/19/2019.   10/16/2019 Imaging   MRI Abdomen  IMPRESSION: 1. Substantially motion degraded scan. 2. Probable persistent small anterior left lung base pneumothorax, better seen on chest CT from 1 day prior. 3. Poorly marginated hypoenhancing 3.7 x 2.9 cm pancreatic head mass, which appears to invade the anterior peripancreatic fat, compatible with known pancreatic adenocarcinoma. Diffuse irregular dilatation of the main pancreatic duct in the pancreatic body and tail. Mild narrowing of the main portal vein by the mass. Abdominal vasculature remains patent and otherwise uninvolved. 4. Numerous (greater than 10) small liver masses scattered throughout the liver, largest 1.0 cm, which appear to demonstrate targetoid enhancement on the limited motion degraded postcontrast sequences, compatible with liver metastases. 5. Mild porta hepatis adenopathy, suspicious for metastatic disease.   10/18/2019 Cancer Staging   Staging form: Exocrine Pancreas, AJCC 8th Edition - Clinical stage from 10/18/2019: Stage IV (cT3, cN1, cM1) - Signed by Truitt Merle, MD on 10/18/2019   10/24/2019 - 11/21/2019 Chemotherapy   First-line FOLFIRINOX q2weeks starting 10/24/19. C2 postponed due to N/V/D and dose reduced 20-30%. Given poor tolerance,  stopped after  2 cycles.    10/30/2019 Pathology Results   FINAL MICROSCOPIC DIAGNOSIS:   A. LIVER, LESION, BIOPSY:  -  Metastatic carcinoma  -  See comment   COMMENT:   By immunohistochemistry, the neoplastic cells are positive for  cytokeratin 7 and GATA3 with patchy nonspecific staining for PAX 8 but  negative for TTF-1, CDX2 and cytokeratin 20.  Overall, the findings are  consistent with metastasis of the patient's known breast carcinoma.  Prognostic panel (ER, PR, Her-2) is pending and will be reported in an  addendum.    ADDENDUM:   Dr. Laurence Ferrari notified us (November 01, 2019) that the patient was also  being worked up for a pancreatic mass.  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  In addition, ER and PR  are negative.  Gata-3 can be expressed in the pancreatic  adenocarcinomas; therefore, pancreatobiliary primary remains in the  differential.    11/02/2019 Genetic Testing   Negative genetic testing: no pathogenic variants detected in [facility] Common Hereditary Cancers Panel.  The report date is November 02, 2019.   The Common Hereditary Cancers Panel offered by [facility] includes sequencing and/or deletion duplication testing of the following 48 genes: APC, ATM, AXIN2, BARD1, BMPR1A, BRCA1, BRCA2, BRIP1, CDH1, CDK4, CDKN2A (p14ARF), CDKN2A (p16INK4a), CHEK2, CTNNA1, DICER1, EPCAM (Deletion/duplication testing only), GREM1 (promoter region deletion/duplication testing only), KIT, MEN1, MLH1, MSH2, MSH3, MSH6, MUTYH, NBN, NF1, NHTL1, PALB2, PDGFRA, PMS2, POLD1, POLE, PTEN, RAD50, RAD51C, RAD51D, RNF43, SDHB, SDHC, SDHD, SMAD4, SMARCA4. STK11, TP53, TSC1, TSC2, and VHL.  The following genes were evaluated for sequence changes only: SDHA and HOXB13 c.251G>A variant only.   11/04/2019 Imaging   CT AP  IMPRESSION: 1. Circumferential bowel wall thickening with adjacent fat stranding throughout the colon, most predominant in the transverse colon.  This is consistent with colitis, which may be infectious or inflammatory in etiology. 2. Ill-defined pancreatic head mass consistent with known malignancy, similar to mildly increased in comparison to prior CT. There are innumerable hypodense masses throughout the liver, increased in conspicuity in comparison to prior CT. Findings are worrisome for worsening metastatic disease. 3. Filling defect in the LEFT internal iliac vein, likely a thrombus with differential considerations including mixing artifact.   11/24/2019 Imaging   CT AP  IMPRESSION: Pancreatic head mass again noted compatible with patient's given history of pancreatic cancer.   Numerous metastases throughout the liver, enlarging since prior study.     12/10/2019 Procedure   Upper endoscopy by Dr Michail Sermon  IMPRESSION - Z-line regular, 42 cm from the incisors. - Esophageal ulcer with stigmata of recent bleeding. - Gastritis. Biopsied. - Mucosal changes in the duodenum. Biopsied. - Normal second portion of the duodenum. - Gastric and duodenal thickening concerning for inflammation from mets or due to mets from known pancreatic cancer.   FINAL MICROSCOPIC DIAGNOSIS:   A. DUODENUM, BIOPSY:  - Benign duodenal mucosa.  - No dysplasia or malignancy.   B. STOMACH, BIOPSY:  - Antral mucosa with mild chronic active inflammation.  - No intestinal metaplasia, dysplasia or carcinoma.   12/28/2019 -  Chemotherapy   Change her to second-line Gemcitabine 2 weeks on/1 week off starting 12/28/19. May add Cisplatin if tolerable.       CURRENT THERAPY:  Change her to second-line Gemcitabine 2 weeks on/1 week off starting 12/28/19. May add Cisplatin if tolerable.   INTERVAL HISTORY:  AGRIPINA GUYETTE is here for a follow up. She presents to the clinic with  family member. She notes her fatigue fluctuates. She feels better since hospitalization but has mild weakness. She notes she is trying to manage with the TPN. She is eating  mildly with oatmeal and apples in the morning. Other foods taste terrible due to taste change. She plans to use supplemental protein. She also notes not having a appetite. She has Marinol but has not tried it yet given taste change. She uses 2-3 tabs of oxycodone a day.    REVIEW OF SYSTEMS:   Constitutional: Denies fevers, chills or abnormal weight loss Eyes: Denies blurriness of vision Ears, nose, mouth, throat, and face: Denies mucositis or sore throat  Respiratory: Denies cough, dyspnea or wheezes Cardiovascular: Denies palpitation, chest discomfort or lower extremity swelling Gastrointestinal:  Denies nausea, heartburn or change in bowel habits (+) Nausea  Skin: Denies abnormal skin rashes Lymphatics: Denies new lymphadenopathy or easy bruising Neurological:Denies numbness, tingling or new weaknesses Behavioral/Psych: Mood is stable, no new changes  All other systems were reviewed with the patient and are negative.  MEDICAL HISTORY:  Past Medical History:  Diagnosis Date  . Allergies    peanuts, corn, beans, red grapefruit, naval oranges  . Asthma    allergy shots and medication  . Cancer [facility])    pancreatic  . Collagen vascular disease ([place])   . DDD (degenerative disc disease), lumbar   . Eustachian tube dysfunction 12/2012   rhinitis, vertigo- Dr. Wilburn Cornelia, ENT   . Fibroids   . Heart murmur    Echo 1/18: EF 55-60, no RWMA, normal diastolic function, trivial AI, PASP 32  . History of cardiac catheterization    LHC 3/18: normal coronary arteries.   . History of nuclear stress test    ETT-Myoview 2/18: EF 62, + ECG response; apical and distal septal ischemia; intermediate risk.  Marland Kitchen Hypertension   . Iron deficiency anemia   . Lupus [facility]) 2011   Dr. Trudie Reed  . Migraine headache    trial of generic maxalt 10 mg, January 2021  . Obesity   . Seasonal allergies   . Seizure in childhood [facility])    as a child no treatment none x 30 years    SURGICAL HISTORY: Past Surgical  History:  Procedure Laterality Date  . ABDOMINAL HYSTERECTOMY    . BACK SURGERY  2009  . BIOPSY  12/10/2019   Procedure: BIOPSY;  Surgeon: Wilford Corner, MD;  Location: [facility];  Service: Endoscopy;;  . COLONOSCOPY WITH PROPOFOL N/A 04/11/2012   Procedure: COLONOSCOPY WITH PROPOFOL;  Surgeon: Garlan Fair, MD;  Location: [facility];  Service: Endoscopy;  Laterality: N/A;  . ESOPHAGOGASTRODUODENOSCOPY (EGD) WITH PROPOFOL N/A 12/10/2019   Procedure: ESOPHAGOGASTRODUODENOSCOPY (EGD) WITH PROPOFOL;  Surgeon: Wilford Corner, MD;  Location: [facility];  Service: Endoscopy;  Laterality: N/A;  . HERNIA REPAIR     as child unbilical  . A1-P partial discectomy and laminectomy     Dr. Christella Noa  . LEFT HEART CATH AND CORONARY ANGIOGRAPHY N/A 04/09/2016   Procedure: Left Heart Cath and Coronary Angiography;  Surgeon: Nelva Bush, MD;  Location: [facility];  Service: Cardiovascular;  Laterality: N/A;  . MOUTH SURGERY    . PORTACATH PLACEMENT N/A 10/11/2019   Procedure: INSERTION PORT-A-CATH LEFT SUBCLAVIAN;  Surgeon: [name], MD;  Location: [facility];  Service: General;  Laterality: N/A;    I have reviewed the social history and family history with the patient and they are unchanged from previous note.  ALLERGIES:  is allergic to  cinnamon, peanut-containing drug products, prednisone, corn oil, corn-containing products, and other.  MEDICATIONS:  Current Outpatient Medications  Medication Sig Dispense Refill  . acetaminophen (TYLENOL) 500 MG tablet Take 1,000 mg by mouth every 6 (six) hours as needed for moderate pain.    Marland Kitchen albuterol (PROVENTIL HFA;VENTOLIN HFA) 108 (90 Base) MCG/ACT inhaler Inhale 2 puffs into the lungs every 6 (six) hours as needed for wheezing or shortness of breath. 18 g 0  . Bepotastine Besilate (BEPREVE) 1.5 % SOLN Place 1 drop into both eyes 2 (two) times daily as needed (dry eyes).     . Cholecalciferol (VITAMIN D) 2000 units  CAPS Take 2,000 Units by mouth daily.     Marland Kitchen dexamethasone (DECADRON) 4 MG tablet Take 1 tablet (4 mg total) by mouth daily. Take 1 tablet once daily for 3 to 5 days after chemo 20 tablet 0  . diclofenac (VOLTAREN) 75 MG EC tablet Take 1 tablet (75 mg total) by mouth 2 (two) times daily as needed.    . Dietary Management Product (RHEUMATE) CAPS Take 1 capsule by mouth daily.     . diphenoxylate-atropine (LOMOTIL) 2.5-0.025 MG tablet Take 2 tablets by mouth 4 (four) times daily as needed for diarrhea or loose stools (use 2nd, if Imodium doesn't work. Max dose: 8 tablets/day.). (Patient taking differently: Take 2 tablets by mouth 4 (four) times daily as needed for diarrhea or loose stools. ) 20 tablet 0  . DIPROLENE 0.05 % ointment Apply 1 application topically daily as needed (skin irritation).     Marland Kitchen dronabinol (MARINOL) 2.5 MG capsule Take 1 capsule (2.5 mg total) by mouth 2 (two) times daily before a meal. (Patient taking differently: Take 2.5 mg by mouth 2 (two) times daily as needed (nausea). ) 30 capsule 0  . enoxaparin (LOVENOX) 120 MG/0.8ML injection Inject 0.8 mLs (120 mg total) into the skin daily. 0.8 mL 30  . feeding supplement (ENSURE ENLIVE / ENSURE PLUS) LIQD Take 237 mLs by mouth 2 (two) times daily between meals. 237 mL 12  . ferrous sulfate 325 (65 FE) MG tablet Take 325 mg by mouth daily with breakfast.    . fluticasone (FLONASE) 50 MCG/ACT nasal spray Place 2 sprays into both nostrils daily. 16 g 12  . hydroxychloroquine (PLAQUENIL) 200 MG tablet Take 400 mg by mouth daily.   5  . lidocaine-prilocaine (EMLA) cream Apply to affected area once (Patient taking differently: Apply 1 application topically daily as needed (for port access). ) 30 g 3  . lipase/protease/amylase 24000-76000 units CPEP Take 1 capsule (24,000 Units total) by mouth 3 (three) times daily with meals. 270 capsule 0  . loperamide (IMODIUM) 2 MG capsule Take 2 capsules (4 mg total) by mouth 2 (two) times daily as needed  for diarrhea or loose stools (Use 1st). 30 capsule 0  . loratadine (CLARITIN) 10 MG tablet Take 10 mg by mouth daily.    Marland Kitchen LORazepam (ATIVAN) 0.5 MG tablet Take 1 tablet (0.5 mg total) by mouth every 8 (eight) hours as needed for anxiety (and anticiaptory n/v. DO NOT TAKE WITH XANAX). 30 tablet 0  . metoprolol succinate (TOPROL-XL) 50 MG 24 hr tablet Take 50 mg by mouth daily.     Marland Kitchen OLANZapine (ZYPREXA) 7.5 MG tablet Take 1 tablet (7.5 mg total) by mouth at bedtime. 30 tablet 0  . ondansetron (ZOFRAN) 8 MG tablet Take 1 tablet (8 mg total) by mouth 2 (two) times daily as needed. Start on day 3 after chemotherapy. (  Patient taking differently: Take 8 mg by mouth 2 (two) times daily as needed for nausea or vomiting. Start on day 3 after chemotherapy.) 30 tablet 1  . oxyCODONE (OXY IR/ROXICODONE) 5 MG immediate release tablet Take 1 tablet (5 mg total) by mouth every 6 (six) hours as needed for severe pain. 60 tablet 0  . pantoprazole (PROTONIX) 40 MG tablet Take 40 mg by mouth daily.    . polyethylene glycol (MIRALAX / GLYCOLAX) 17 g packet Take 17 g by mouth daily. 30 each 1  . potassium chloride 20 MEQ/15ML (10%) SOLN Take 15 mLs (20 mEq total) by mouth 2 (two) times daily. 750 mL 1  . prochlorperazine (COMPAZINE) 10 MG tablet Take 1 tablet (10 mg total) by mouth every 6 (six) hours as needed (Nausea or vomiting). (Patient taking differently: Take 10 mg by mouth every 6 (six) hours as needed for nausea or vomiting. ) 30 tablet 1  . senna-docusate (SENOKOT-S) 8.6-50 MG tablet Take 1 tablet by mouth 2 (two) times daily. 60 tablet 1  . sucralfate (CARAFATE) 1 GM/10ML suspension Take 10 mLs (1 g total) by mouth 4 (four) times daily -  with meals and at bedtime. 420 mL 3  . topiramate (TOPAMAX) 50 MG tablet Take 1 tablet (50 mg total) by mouth at bedtime.     No current facility-administered medications for this visit.    PHYSICAL EXAMINATION: ECOG PERFORMANCE STATUS: 2 - Symptomatic, <50% confined to  bed  Vitals:   12/18/19 1339  BP: 134/83  Pulse: (!) 144  Resp: 17  Temp: 98.3 F (36.8 C)  SpO2: 100%   Filed Weights   12/18/19 1339  Weight: 175 lb (79.4 kg)    Due to COVID19 we will limit examination to appearance. Patient had no complaints.  GENERAL:alert, no distress and comfortable SKIN: skin color normal, no rashes or significant lesions EYES: normal, Conjunctiva are pink and non-injected, sclera clear  NEURO: alert & oriented x 3 with fluent speech   LABORATORY DATA:  I have reviewed the data as listed CBC Latest Ref Rng & Units 12/18/2019 12/10/2019 12/05/2019  WBC 4.0 - 10.5 K/uL 7.1 3.3(L) 3.2(L)  Hemoglobin 12.0 - 15.0 g/dL 8.6(L) 8.7(L) 9.1(L)  Hematocrit 36 - 46 % 26.9(L) 27.2(L) 27.6(L)  Platelets 150 - 400 K/uL 158 144(L) 169     CMP Latest Ref Rng & Units 12/18/2019 12/12/2019 12/11/2019  Glucose 70 - 99 mg/dL 169(H) 126(H) 126(H)  BUN 6 - 20 mg/dL 21(H) 18 16  Creatinine 0.44 - 1.00 mg/dL 0.64 0.54 0.57  Sodium 135 - 145 mmol/L 139 134(L) 134(L)  Potassium 3.5 - 5.1 mmol/L 4.4 4.1 3.7  Chloride 98 - 111 mmol/L 103 106 105  CO2 22 - 32 mmol/L 25 22 21(L)  Calcium 8.9 - 10.3 mg/dL 8.8(L) 8.9 8.7(L)  Total Protein 6.5 - 8.1 g/dL 7.3 6.5 -  Total Bilirubin 0.3 - 1.2 mg/dL 0.5 0.6 -  Alkaline Phos 38 - 126 U/L 224(H) 151(H) -  AST 15 - 41 U/L 53(H) 62(H) -  ALT 0 - 44 U/L 59(H) 57(H) -      RADIOGRAPHIC STUDIES: I have personally reviewed the radiological images as listed and agreed with the findings in the report. No results found.   ASSESSMENT & PLAN:  Cynthia Frye is a 59 y.o. female with   1. 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  Her EUS biopsy with Dr Paulita Fujita on 09/25/19 shows pancreatic adenocarcinoma.  -Her 10/16/19 abdominal MRI showed multiple (>10) small liver lesions, most are consistent with metastatic  lesions. Liver biopsy from 10/30/19 confirmed metastatic pancreatic cancer.  -I started her on first-line or neoadjuvant chemo with FOLFIRINOX q2weeks on 10/24/19. Goal of therapy is to control or shrink disease. Due to poor tolerance treatment stopped after C2 11/21/19. She had prolonged hospital stay nausea vomiting, and anorexia, was recently discharged home with TPN. -I plan to switch her to second-line Gemcitabine 2 weeks on/1 week off starting 12/28/19. If she tolerates well will add Abraxane -F/u next week with treatment.    2. Upper abdominal cramps, Weight loss, N/V/D -Secondary to #1 and chemo.  -She has had intermittent upper abdominal cramping after certain meals. She also has had a gradual 40 pounds weight loss over 7 months (since 03/2019) -Her appetite fluctuates. Continue to f/u with dietician. I recommend she use nutritional supplement 1-3 times daily. She plans to start supplemental protein.  -Pt notes Mirtazapine gave her jitters and stopped. I gave her Marinol but has not tried it.  -Her 12/10/19 EGD showed Esophageal stricture or gastric/duodenal obstruction. She was given TPN upon 11/2019 hospital discharge. She is eating mildly by mouth.  -For pain she takes Oxycodone 2-3 tabs daily. I refilled today (12/17/19).   3. Comorbidities: Lupus, HTN, Migraines  -Symptomatic in her joints, Dx many years ago  -She is being followed by Rheumatologist Dr Wyline Copas and being treated with weekly Methotrexate injections, Plaquenil andDiclofenac. -For Migraines is well controlled on Topomax. She has been seen by Dr Jaynee Eagles.   4. Leftsmall pneumothorax -Secondary to port placement. Repeat CXR 10/18/19 shows enlarging but still small persistent pneum. Will continue to monitoring   5. Anxiety, Social/Financial Support  -She has been very anxious with the fast pace workup and unexpected diagnosis. -She lives with her husband in [place]. She does not have children. She does have a brother  who lives close by but no other near relatives.  6. Left Axillary Lymphadenopathy  -Her CT chest from 10/15/19 showed multiple left axillary and subpectoral adenopathy. -Her left mammogram/US from 10/23/19 shows multiple left axillary LNs with cortical thickening up to 5-6 mm. Not palpable on exam. Radiology and I feel this is likely reactive to covid19 vaccine and the recommendation is short interval follow up.   7. Anemia  -Secondary to secondary to recent chemotherapy and underlying lupus -Last blood transfusion on 12/13/19.  -Hg 8.6 today (12/18/19)  8. Left iliac vein DVT in 10/2019 -Seen on 11/04/19 CT AP with thrombosis of left internal iliac vein.  -She is currently on Lovenox injections, will continue     9. Hypokalemia  -secondary to recent diarrhea. On oral potassium once daily. She is fine to crush pill due to size.    PLAN: -I refilled Oxycodone today.  -Lab, flush, F/u and Gemcitabine on 12/3 and 12/10.  -Continue TPN, follow-up with dietitian   No problem-specific Assessment & Plan notes found for this encounter.   No orders of the defined types were placed in this encounter.  All questions were answered. The patient knows to call the clinic with any problems, questions or concerns. No barriers to learning was detected. The total time spent in the appointment was 40 minutes.     Truitt Merle, MD 12/18/2019   I, Joslyn Devon, am acting as scribe for Truitt Merle, MD.   I have reviewed the above documentation for accuracy and completeness,  and I agree with the above.

## 2019-12-17 DIAGNOSIS — K529 Noninfective gastroenteritis and colitis, unspecified: Secondary | ICD-10-CM | POA: Diagnosis not present

## 2019-12-17 DIAGNOSIS — R112 Nausea with vomiting, unspecified: Secondary | ICD-10-CM | POA: Diagnosis not present

## 2019-12-17 DIAGNOSIS — E440 Moderate protein-calorie malnutrition: Secondary | ICD-10-CM | POA: Diagnosis not present

## 2019-12-17 DIAGNOSIS — R1310 Dysphagia, unspecified: Secondary | ICD-10-CM | POA: Diagnosis not present

## 2019-12-18 ENCOUNTER — Other Ambulatory Visit: Payer: Self-pay

## 2019-12-18 ENCOUNTER — Inpatient Hospital Stay: Payer: BC Managed Care – PPO | Attending: Hematology | Admitting: Hematology

## 2019-12-18 ENCOUNTER — Inpatient Hospital Stay: Payer: BC Managed Care – PPO

## 2019-12-18 ENCOUNTER — Encounter: Payer: Self-pay | Admitting: Hematology

## 2019-12-18 VITALS — BP 134/83 | HR 144 | Temp 98.3°F | Resp 17 | Ht 67.0 in | Wt 175.0 lb

## 2019-12-18 DIAGNOSIS — R531 Weakness: Secondary | ICD-10-CM | POA: Diagnosis not present

## 2019-12-18 DIAGNOSIS — G43909 Migraine, unspecified, not intractable, without status migrainosus: Secondary | ICD-10-CM | POA: Diagnosis not present

## 2019-12-18 DIAGNOSIS — J939 Pneumothorax, unspecified: Secondary | ICD-10-CM | POA: Insufficient documentation

## 2019-12-18 DIAGNOSIS — R590 Localized enlarged lymph nodes: Secondary | ICD-10-CM | POA: Insufficient documentation

## 2019-12-18 DIAGNOSIS — I10 Essential (primary) hypertension: Secondary | ICD-10-CM | POA: Diagnosis not present

## 2019-12-18 DIAGNOSIS — E876 Hypokalemia: Secondary | ICD-10-CM | POA: Diagnosis not present

## 2019-12-18 DIAGNOSIS — C250 Malignant neoplasm of head of pancreas: Secondary | ICD-10-CM

## 2019-12-18 DIAGNOSIS — R112 Nausea with vomiting, unspecified: Secondary | ICD-10-CM | POA: Diagnosis not present

## 2019-12-18 DIAGNOSIS — Z7901 Long term (current) use of anticoagulants: Secondary | ICD-10-CM | POA: Insufficient documentation

## 2019-12-18 DIAGNOSIS — I82422 Acute embolism and thrombosis of left iliac vein: Secondary | ICD-10-CM | POA: Diagnosis not present

## 2019-12-18 DIAGNOSIS — M329 Systemic lupus erythematosus, unspecified: Secondary | ICD-10-CM | POA: Insufficient documentation

## 2019-12-18 DIAGNOSIS — F419 Anxiety disorder, unspecified: Secondary | ICD-10-CM | POA: Insufficient documentation

## 2019-12-18 DIAGNOSIS — R634 Abnormal weight loss: Secondary | ICD-10-CM | POA: Insufficient documentation

## 2019-12-18 DIAGNOSIS — Z95828 Presence of other vascular implants and grafts: Secondary | ICD-10-CM

## 2019-12-18 DIAGNOSIS — D6481 Anemia due to antineoplastic chemotherapy: Secondary | ICD-10-CM | POA: Insufficient documentation

## 2019-12-18 DIAGNOSIS — R5383 Other fatigue: Secondary | ICD-10-CM | POA: Diagnosis not present

## 2019-12-18 DIAGNOSIS — C787 Secondary malignant neoplasm of liver and intrahepatic bile duct: Secondary | ICD-10-CM | POA: Diagnosis not present

## 2019-12-18 LAB — CMP (CANCER CENTER ONLY)
ALT: 59 U/L — ABNORMAL HIGH (ref 0–44)
AST: 53 U/L — ABNORMAL HIGH (ref 15–41)
Albumin: 2.8 g/dL — ABNORMAL LOW (ref 3.5–5.0)
Alkaline Phosphatase: 224 U/L — ABNORMAL HIGH (ref 38–126)
Anion gap: 11 (ref 5–15)
BUN: 21 mg/dL — ABNORMAL HIGH (ref 6–20)
CO2: 25 mmol/L (ref 22–32)
Calcium: 8.8 mg/dL — ABNORMAL LOW (ref 8.9–10.3)
Chloride: 103 mmol/L (ref 98–111)
Creatinine: 0.64 mg/dL (ref 0.44–1.00)
GFR, Estimated: >60 mL/min (ref >60)
Glucose, Bld: 169 mg/dL — ABNORMAL HIGH (ref 70–99)
Potassium: 4.4 mmol/L (ref 3.5–5.1)
Sodium: 139 mmol/L (ref 135–145)
Total Bilirubin: 0.5 mg/dL (ref 0.3–1.2)
Total Protein: 7.3 g/dL (ref 6.5–8.1)

## 2019-12-18 LAB — SAMPLE TO BLOOD BANK

## 2019-12-18 LAB — CBC WITH DIFFERENTIAL (CANCER CENTER ONLY)
Abs Immature Granulocytes: 0.03 10*3/uL (ref 0.00–0.07)
Basophils Absolute: 0 10*3/uL (ref 0.0–0.1)
Basophils Relative: 0 %
Eosinophils Absolute: 0.2 10*3/uL (ref 0.0–0.5)
Eosinophils Relative: 2 %
HCT: 26.9 % — ABNORMAL LOW (ref 36.0–46.0)
Hemoglobin: 8.6 g/dL — ABNORMAL LOW (ref 12.0–15.0)
Immature Granulocytes: 0 %
Lymphocytes Relative: 17 %
Lymphs Abs: 1.2 10*3/uL (ref 0.7–4.0)
MCH: 29.6 pg (ref 26.0–34.0)
MCHC: 32 g/dL (ref 30.0–36.0)
MCV: 92.4 fL (ref 80.0–100.0)
Monocytes Absolute: 0.5 10*3/uL (ref 0.1–1.0)
Monocytes Relative: 7 %
Neutro Abs: 5.2 10*3/uL (ref 1.7–7.7)
Neutrophils Relative %: 74 %
Platelet Count: 158 10*3/uL (ref 150–400)
RBC: 2.91 MIL/uL — ABNORMAL LOW (ref 3.87–5.11)
RDW: 15.6 % — ABNORMAL HIGH (ref 11.5–15.5)
WBC Count: 7.1 10*3/uL (ref 4.0–10.5)
nRBC: 0 % (ref 0.0–0.2)

## 2019-12-18 MED ORDER — OXYCODONE HCL 5 MG PO TABS: 5.0000 mg | ORAL_TABLET | Freq: Four times a day (QID) | ORAL | 0 refills | Status: DC | PRN

## 2019-12-18 MED ORDER — SODIUM CHLORIDE 0.9% FLUSH
10.0000 mL | Freq: Once | INTRAVENOUS | Status: DC
  Filled 2019-12-18: qty 10

## 2019-12-18 MED ORDER — HEPARIN SOD (PORK) LOCK FLUSH 100 UNIT/ML IV SOLN
500.0000 [IU] | Freq: Once | INTRAVENOUS | Status: DC
  Filled 2019-12-18: qty 5

## 2019-12-18 NOTE — Patient Instructions (Signed)

## 2019-12-18 NOTE — Progress Notes (Signed)
DISCONTINUE ON PATHWAY REGIMEN - Pancreatic Adenocarcinoma     A cycle is every 14 days:     Oxaliplatin      Leucovorin      Irinotecan      Fluorouracil   **Always confirm dose/schedule in your pharmacy ordering system**  REASON: Toxicities / Adverse Event PRIOR TREATMENT: PANOS98: mFOLFIRINOX q14 Days Until Progression or Toxicity TREATMENT RESPONSE: Unable to Evaluate  START ON PATHWAY REGIMEN - Pancreatic Adenocarcinoma     A cycle is every 28 days:     Gemcitabine   **Always confirm dose/schedule in your pharmacy ordering system**  Patient Characteristics: Metastatic Disease, Second Line, MSS/pMMR or MSI Unknown, Fluoropyrimidine-Based Therapy First Line Therapeutic Status: Metastatic Disease Line of Therapy: Second Line Microsatellite/Mismatch Repair Status: MSS/pMMR Intent of Therapy: Non-Curative / Palliative Intent, Discussed with Patient

## 2019-12-18 NOTE — Progress Notes (Signed)
Patient had TPN running so she had labs drawn from arm.

## 2019-12-19 ENCOUNTER — Ambulatory Visit: Payer: BC Managed Care – PPO | Admitting: Nurse Practitioner

## 2019-12-19 ENCOUNTER — Other Ambulatory Visit: Payer: BC Managed Care – PPO

## 2019-12-19 ENCOUNTER — Ambulatory Visit: Payer: BC Managed Care – PPO

## 2019-12-19 LAB — CANCER ANTIGEN 19-9: CA 19-9: 140503 U/mL — ABNORMAL HIGH (ref 0–35)

## 2019-12-20 DIAGNOSIS — K529 Noninfective gastroenteritis and colitis, unspecified: Secondary | ICD-10-CM | POA: Diagnosis not present

## 2019-12-20 DIAGNOSIS — R1310 Dysphagia, unspecified: Secondary | ICD-10-CM | POA: Diagnosis not present

## 2019-12-20 DIAGNOSIS — E440 Moderate protein-calorie malnutrition: Secondary | ICD-10-CM | POA: Diagnosis not present

## 2019-12-20 DIAGNOSIS — R112 Nausea with vomiting, unspecified: Secondary | ICD-10-CM | POA: Diagnosis not present

## 2019-12-21 DIAGNOSIS — E440 Moderate protein-calorie malnutrition: Secondary | ICD-10-CM | POA: Diagnosis not present

## 2019-12-21 DIAGNOSIS — K529 Noninfective gastroenteritis and colitis, unspecified: Secondary | ICD-10-CM | POA: Diagnosis not present

## 2019-12-21 DIAGNOSIS — R112 Nausea with vomiting, unspecified: Secondary | ICD-10-CM | POA: Diagnosis not present

## 2019-12-22 DIAGNOSIS — E440 Moderate protein-calorie malnutrition: Secondary | ICD-10-CM | POA: Diagnosis not present

## 2019-12-22 DIAGNOSIS — R112 Nausea with vomiting, unspecified: Secondary | ICD-10-CM | POA: Diagnosis not present

## 2019-12-22 DIAGNOSIS — K529 Noninfective gastroenteritis and colitis, unspecified: Secondary | ICD-10-CM | POA: Diagnosis not present

## 2019-12-23 DIAGNOSIS — K529 Noninfective gastroenteritis and colitis, unspecified: Secondary | ICD-10-CM | POA: Diagnosis not present

## 2019-12-23 DIAGNOSIS — R112 Nausea with vomiting, unspecified: Secondary | ICD-10-CM | POA: Diagnosis not present

## 2019-12-23 DIAGNOSIS — E440 Moderate protein-calorie malnutrition: Secondary | ICD-10-CM | POA: Diagnosis not present

## 2019-12-24 ENCOUNTER — Other Ambulatory Visit: Payer: Self-pay

## 2019-12-24 ENCOUNTER — Telehealth: Payer: Self-pay

## 2019-12-24 DIAGNOSIS — R110 Nausea: Secondary | ICD-10-CM

## 2019-12-24 DIAGNOSIS — R1310 Dysphagia, unspecified: Secondary | ICD-10-CM | POA: Diagnosis not present

## 2019-12-24 DIAGNOSIS — E46 Unspecified protein-calorie malnutrition: Secondary | ICD-10-CM | POA: Diagnosis not present

## 2019-12-24 DIAGNOSIS — I82409 Acute embolism and thrombosis of unspecified deep veins of unspecified lower extremity: Secondary | ICD-10-CM | POA: Diagnosis not present

## 2019-12-24 DIAGNOSIS — E440 Moderate protein-calorie malnutrition: Secondary | ICD-10-CM | POA: Diagnosis not present

## 2019-12-24 DIAGNOSIS — C251 Malignant neoplasm of body of pancreas: Secondary | ICD-10-CM | POA: Diagnosis not present

## 2019-12-24 DIAGNOSIS — R634 Abnormal weight loss: Secondary | ICD-10-CM | POA: Diagnosis not present

## 2019-12-24 DIAGNOSIS — C250 Malignant neoplasm of head of pancreas: Secondary | ICD-10-CM

## 2019-12-24 DIAGNOSIS — R112 Nausea with vomiting, unspecified: Secondary | ICD-10-CM | POA: Diagnosis not present

## 2019-12-24 DIAGNOSIS — K529 Noninfective gastroenteritis and colitis, unspecified: Secondary | ICD-10-CM | POA: Diagnosis not present

## 2019-12-24 MED ORDER — ONDANSETRON HCL 8 MG PO TABS: 8.0000 mg | ORAL_TABLET | Freq: Three times a day (TID) | ORAL | 3 refills | Status: DC | PRN

## 2019-12-24 NOTE — Progress Notes (Signed)
Pharmacist Chemotherapy Monitoring - Initial Assessment    Anticipated start date: 12/28/2019   Regimen:  . Are orders appropriate based on the patient's diagnosis, regimen, and cycle? Yes . Does the plan date match the patient's scheduled date? Yes . Is the sequencing of drugs appropriate? Yes . Are the premedications appropriate for the patient's regimen? Yes . Prior Authorization for treatment is: Approved o If applicable, is the correct biosimilar selected based on the patient's insurance? not applicable  Organ Function and Labs: Marland Kitchen Are dose adjustments needed based on the patient's renal function, hepatic function, or hematologic function? Yes . Are appropriate labs ordered prior to the start of patient's treatment? Yes . Other organ system assessment, if indicated: N/A . The following baseline labs, if indicated, have been ordered: N/A  Dose Assessment: . Are the drug doses appropriate? Yes . Are the following correct: o Drug concentrations Yes o IV fluid compatible with drug Yes o Administration routes Yes o Timing of therapy Yes . If applicable, does the patient have documented access for treatment and/or plans for port-a-cath placement? yes . If applicable, have lifetime cumulative doses been properly documented and assessed? not applicable Lifetime Dose Tracking  . Oxaliplatin: 146.433 mg/m2 (290 mg) = 24.41 % of the maximum lifetime dose of 600 mg/m2  o   Toxicity Monitoring/Prevention: . The patient has the following take home antiemetics prescribed: Dexamethasone . The patient has the following take home medications prescribed: N/A . Medication allergies and previous infusion related reactions, if applicable, have been reviewed and addressed. No . The patient's current medication list has been assessed for drug-drug interactions with their chemotherapy regimen. no significant drug-drug interactions were identified on review.  Order Review: . Are the treatment plan  orders signed? Yes . Is the patient scheduled to see a provider prior to their treatment? Yes  I verify that I have reviewed each item in the above checklist and answered each question accordingly.  Heer Justiss D 12/24/2019 3:41 PM

## 2019-12-24 NOTE — Telephone Encounter (Signed)
Patient calls stating compazine makes her too dizzy.  She is asking for Zofran [facility].  She also states she is supposed to follow up with GI but they are supposed to call her with the appointment and she has heard anything.

## 2019-12-25 ENCOUNTER — Other Ambulatory Visit: Payer: BC Managed Care – PPO

## 2019-12-25 DIAGNOSIS — C787 Secondary malignant neoplasm of liver and intrahepatic bile duct: Secondary | ICD-10-CM | POA: Diagnosis not present

## 2019-12-25 DIAGNOSIS — C259 Malignant neoplasm of pancreas, unspecified: Secondary | ICD-10-CM | POA: Diagnosis not present

## 2019-12-25 DIAGNOSIS — K529 Noninfective gastroenteritis and colitis, unspecified: Secondary | ICD-10-CM | POA: Diagnosis not present

## 2019-12-25 DIAGNOSIS — R112 Nausea with vomiting, unspecified: Secondary | ICD-10-CM | POA: Diagnosis not present

## 2019-12-25 DIAGNOSIS — M0600 Rheumatoid arthritis without rheumatoid factor, unspecified site: Secondary | ICD-10-CM | POA: Diagnosis not present

## 2019-12-25 DIAGNOSIS — E440 Moderate protein-calorie malnutrition: Secondary | ICD-10-CM | POA: Diagnosis not present

## 2019-12-25 DIAGNOSIS — M329 Systemic lupus erythematosus, unspecified: Secondary | ICD-10-CM | POA: Diagnosis not present

## 2019-12-26 DIAGNOSIS — R112 Nausea with vomiting, unspecified: Secondary | ICD-10-CM | POA: Diagnosis not present

## 2019-12-26 DIAGNOSIS — E440 Moderate protein-calorie malnutrition: Secondary | ICD-10-CM | POA: Diagnosis not present

## 2019-12-26 DIAGNOSIS — K529 Noninfective gastroenteritis and colitis, unspecified: Secondary | ICD-10-CM | POA: Diagnosis not present

## 2019-12-26 NOTE — Progress Notes (Signed)
[place]   Telephone:([phone] [phone]   Clinic Follow up Note   Patient Care Team: Josetta Huddle, MD as PCP - General (Internal Medicine) Jonnie Finner, RN as Oncology Nurse Navigator Truitt Merle, MD as Consulting Physician (Oncology) Arta Silence, MD as Consulting Physician (Gastroenterology) [name], MD as Consulting Physician (General Surgery)  Date of Service:  12/28/2019  CHIEF COMPLAINT: f/u of Pancreatic Cancer  SUMMARY OF ONCOLOGIC HISTORY: Oncology History Overview Note  Cancer Staging Pancreatic cancer [facility]) Staging form: Exocrine Pancreas, AJCC 8th Edition - Clinical stage from 10/18/2019: Stage IV (cT3, cN1, cM1) - Signed by Truitt Merle, MD on 10/18/2019    Pancreatic cancer ([place])  09/19/2019 Imaging   CT AP w contrast 09/19/19  IMPRESSION: 1. Findings are highly concerning for probable primary pancreatic adenocarcinoma in the anterior aspect of the pancreatic head. Several prominent borderline enlarged lymph nodes are noted in the hepatoduodenal nodal station, and there are multiple indeterminate liver lesions which are highly concerning for probable hepatic metastases. Further evaluation with nonemergent abdominal MRI with and without IV gadolinium with MRCP is recommended in the near future to better evaluate these findings.   09/25/2019 Procedure   Upper EUS by Dr Paulita Fujita  IMPRESSION -There was no evidence of significant pathology in the left lobe of the liver.  -A few lymph nodes were visualized and measures in the peripancreatic region and porta hepata region.  -Hyperchoic material consistent with sludge was visualized endosonographically in the gallbladder.  -There was no sign of significant pathology in the common bile duct.  -A mass was identified in the pancreatic head. If biopsy results show adenocarcinoma, it would be staged T3N1Mx by endosonographic criteria. Fine needle aspiration performed.    09/25/2019  Initial Biopsy   FINAL MICROSCOPIC DIAGNOSIS: Fine needle aspirate, Pancreas;  MALIGNANT CELLS PRESENT CONSISTENT WITH ADENOCARCINOMA.    10/02/2019 Initial Diagnosis   Pancreatic cancer ([place])   10/11/2019 Procedure   PAC placement y Dr Barry Dienes    10/15/2019 Imaging   CT Chest  IMPRESSION: 1. Small anterior left pneumothorax with dependent atelectasis in the left lower lobe. 2. Increased number of bilateral axillary and subpectoral lymph nodes with mild lymphadenopathy in the left axilla. While this would be an atypical presentation for metastatic pancreatic cancer, this possibility is not excluded 3. Main duct dilatation in the pancreas, better assessed on abdomen CT 09/19/2019.   10/16/2019 Imaging   MRI Abdomen  IMPRESSION: 1. Substantially motion degraded scan. 2. Probable persistent small anterior left lung base pneumothorax, better seen on chest CT from 1 day prior. 3. Poorly marginated hypoenhancing 3.7 x 2.9 cm pancreatic head mass, which appears to invade the anterior peripancreatic fat, compatible with known pancreatic adenocarcinoma. Diffuse irregular dilatation of the main pancreatic duct in the pancreatic body and tail. Mild narrowing of the main portal vein by the mass. Abdominal vasculature remains patent and otherwise uninvolved. 4. Numerous (greater than 10) small liver masses scattered throughout the liver, largest 1.0 cm, which appear to demonstrate targetoid enhancement on the limited motion degraded postcontrast sequences, compatible with liver metastases. 5. Mild porta hepatis adenopathy, suspicious for metastatic disease.   10/18/2019 Cancer Staging   Staging form: Exocrine Pancreas, AJCC 8th Edition - Clinical stage from 10/18/2019: Stage IV (cT3, cN1, cM1) - Signed by Truitt Merle, MD on 10/18/2019   10/24/2019 - 11/21/2019 Chemotherapy   First-line FOLFIRINOX q2weeks starting 10/24/19. C2 postponed due to N/V/D and dose reduced 20-30%. Given poor tolerance,  stopped after  2 cycles.    10/30/2019 Pathology Results   FINAL MICROSCOPIC DIAGNOSIS:   A. LIVER, LESION, BIOPSY:  -  Metastatic carcinoma  -  See comment   COMMENT:   By immunohistochemistry, the neoplastic cells are positive for  cytokeratin 7 and GATA3 with patchy nonspecific staining for PAX 8 but  negative for TTF-1, CDX2 and cytokeratin 20.  Overall, the findings are  consistent with metastasis of the patient's known breast carcinoma.  Prognostic panel (ER, PR, Her-2) is pending and will be reported in an  addendum.    ADDENDUM:   Dr. Laurence Ferrari notified us (November 01, 2019) that the patient was also  being worked up for a pancreatic mass.  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  In addition, ER and PR  are negative.  Gata-3 can be expressed in the pancreatic  adenocarcinomas; therefore, pancreatobiliary primary remains in the  differential.    11/02/2019 Genetic Testing   Negative genetic testing: no pathogenic variants detected in [facility] Common Hereditary Cancers Panel.  The report date is November 02, 2019.   The Common Hereditary Cancers Panel offered by [facility] includes sequencing and/or deletion duplication testing of the following 48 genes: APC, ATM, AXIN2, BARD1, BMPR1A, BRCA1, BRCA2, BRIP1, CDH1, CDK4, CDKN2A (p14ARF), CDKN2A (p16INK4a), CHEK2, CTNNA1, DICER1, EPCAM (Deletion/duplication testing only), GREM1 (promoter region deletion/duplication testing only), KIT, MEN1, MLH1, MSH2, MSH3, MSH6, MUTYH, NBN, NF1, NHTL1, PALB2, PDGFRA, PMS2, POLD1, POLE, PTEN, RAD50, RAD51C, RAD51D, RNF43, SDHB, SDHC, SDHD, SMAD4, SMARCA4. STK11, TP53, TSC1, TSC2, and VHL.  The following genes were evaluated for sequence changes only: SDHA and HOXB13 c.251G>A variant only.   11/04/2019 Imaging   CT AP  IMPRESSION: 1. Circumferential bowel wall thickening with adjacent fat stranding throughout the colon, most predominant in the transverse colon.  This is consistent with colitis, which may be infectious or inflammatory in etiology. 2. Ill-defined pancreatic head mass consistent with known malignancy, similar to mildly increased in comparison to prior CT. There are innumerable hypodense masses throughout the liver, increased in conspicuity in comparison to prior CT. Findings are worrisome for worsening metastatic disease. 3. Filling defect in the LEFT internal iliac vein, likely a thrombus with differential considerations including mixing artifact.   11/24/2019 Imaging   CT AP  IMPRESSION: Pancreatic head mass again noted compatible with patient's given history of pancreatic cancer.   Numerous metastases throughout the liver, enlarging since prior study.     12/10/2019 Procedure   Upper endoscopy by Dr Michail Sermon  IMPRESSION - Z-line regular, 42 cm from the incisors. - Esophageal ulcer with stigmata of recent bleeding. - Gastritis. Biopsied. - Mucosal changes in the duodenum. Biopsied. - Normal second portion of the duodenum. - Gastric and duodenal thickening concerning for inflammation from mets or due to mets from known pancreatic cancer.   FINAL MICROSCOPIC DIAGNOSIS:   A. DUODENUM, BIOPSY:  - Benign duodenal mucosa.  - No dysplasia or malignancy.   B. STOMACH, BIOPSY:  - Antral mucosa with mild chronic active inflammation.  - No intestinal metaplasia, dysplasia or carcinoma.   12/28/2019 -  Chemotherapy   Change her to second-line Gemcitabine 2 weeks on/1 week off starting 12/28/19. May add Cisplatin if tolerable.       CURRENT THERAPY:  Change her to second-line Gemcitabine 2 weeks on/1 week off starting 12/28/19. May add Cisplatin if tolerable.   INTERVAL HISTORY:  DONATA REDDICK is here for a follow up. She presents to the clinic alone.  She notes she is dong fairly well. She is eating better but still not where she needs to be. She is mostly on soft foods still and wonders can she start more solid foods.  She uses TPN at night and plans to use at 12. She notes she is having constipation. She was using Senakot and prune juice to help. Her last BM was this morning. She denies bloody or black stool. She notes her nausea is mostly controlled. She thinks one of her antiemetics is leading to blurry vision occasionally. She has stopped compazine. She is not very active lately and wants to increase walking. She notes swelling in her left ankle. She is on Lovenox injection and may have missed 1-2 injections.     REVIEW OF SYSTEMS:   Constitutional: Denies fevers, chills or abnormal weight loss (+) Fair eating Eyes: Denies blurriness of vision Ears, nose, mouth, throat, and face: Denies mucositis or sore throat Respiratory: Denies cough, dyspnea or wheezes Cardiovascular: Denies palpitation, chest discomfort (+) Left ankle swelling Gastrointestinal:  Denies nausea, heartburn (+) Constipation.  Skin: Denies abnormal skin rashes Lymphatics: Denies new lymphadenopathy or easy bruising Neurological:Denies numbness, tingling or new weaknesses Behavioral/Psych: Mood is stable, no new changes  All other systems were reviewed with the patient and are negative.  MEDICAL HISTORY:  Past Medical History:  Diagnosis Date  . Allergies    peanuts, corn, beans, red grapefruit, naval oranges  . Asthma    allergy shots and medication  . Cancer [facility])    pancreatic  . Collagen vascular disease ([place])   . DDD (degenerative disc disease), lumbar   . Eustachian tube dysfunction 12/2012   rhinitis, vertigo- Dr. Wilburn Cornelia, ENT   . Fibroids   . Heart murmur    Echo 1/18: EF 55-60, no RWMA, normal diastolic function, trivial AI, PASP 32  . History of cardiac catheterization    LHC 3/18: normal coronary arteries.   . History of nuclear stress test    ETT-Myoview 2/18: EF 62, + ECG response; apical and distal septal ischemia; intermediate risk.  Marland Kitchen Hypertension   . Iron deficiency anemia   . Lupus [facility]) 2011   Dr.  Trudie Reed  . Migraine headache    trial of generic maxalt 10 mg, January 2021  . Obesity   . Seasonal allergies   . Seizure in childhood [facility])    as a child no treatment none x 30 years    SURGICAL HISTORY: Past Surgical History:  Procedure Laterality Date  . ABDOMINAL HYSTERECTOMY    . BACK SURGERY  2009  . BIOPSY  12/10/2019   Procedure: BIOPSY;  Surgeon: Wilford Corner, MD;  Location: [facility];  Service: Endoscopy;;  . COLONOSCOPY WITH PROPOFOL N/A 04/11/2012   Procedure: COLONOSCOPY WITH PROPOFOL;  Surgeon: Garlan Fair, MD;  Location: [facility];  Service: Endoscopy;  Laterality: N/A;  . ESOPHAGOGASTRODUODENOSCOPY (EGD) WITH PROPOFOL N/A 12/10/2019   Procedure: ESOPHAGOGASTRODUODENOSCOPY (EGD) WITH PROPOFOL;  Surgeon: Wilford Corner, MD;  Location: [facility];  Service: Endoscopy;  Laterality: N/A;  . HERNIA REPAIR     as child unbilical  . A1-K partial discectomy and laminectomy     Dr. Christella Noa  . LEFT HEART CATH AND CORONARY ANGIOGRAPHY N/A 04/09/2016   Procedure: Left Heart Cath and Coronary Angiography;  Surgeon: Nelva Bush, MD;  Location: [facility];  Service: Cardiovascular;  Laterality: N/A;  . MOUTH SURGERY    . PORTACATH PLACEMENT N/A 10/11/2019   Procedure: INSERTION PORT-A-CATH LEFT SUBCLAVIAN;  Surgeon: [name], MD;  Location: [facility];  Service: General;  Laterality: N/A;    I have reviewed the social history and family history with the patient and they are unchanged from previous note.  ALLERGIES:  is allergic to cinnamon, peanut-containing drug products, prednisone, corn oil, corn-containing products, and other.  MEDICATIONS:  Current Outpatient Medications  Medication Sig Dispense Refill  . acetaminophen (TYLENOL) 500 MG tablet Take 1,000 mg by mouth every 6 (six) hours as needed for moderate pain.    Marland Kitchen albuterol (PROVENTIL HFA;VENTOLIN HFA) 108 (90 Base) MCG/ACT inhaler Inhale 2 puffs into the lungs every 6  (six) hours as needed for wheezing or shortness of breath. 18 g 0  . Bepotastine Besilate (BEPREVE) 1.5 % SOLN Place 1 drop into both eyes 2 (two) times daily as needed (dry eyes).     . Cholecalciferol (VITAMIN D) 2000 units CAPS Take 2,000 Units by mouth daily.     Marland Kitchen dexamethasone (DECADRON) 4 MG tablet Take 1 tablet (4 mg total) by mouth daily. Take 1 tablet once daily for 3 to 5 days after chemo 20 tablet 0  . diclofenac (VOLTAREN) 75 MG EC tablet Take 1 tablet (75 mg total) by mouth 2 (two) times daily as needed.    . Dietary Management Product (RHEUMATE) CAPS Take 1 capsule by mouth daily.     . diphenoxylate-atropine (LOMOTIL) 2.5-0.025 MG tablet Take 2 tablets by mouth 4 (four) times daily as needed for diarrhea or loose stools (use 2nd, if Imodium doesn't work. Max dose: 8 tablets/day.). (Patient taking differently: Take 2 tablets by mouth 4 (four) times daily as needed for diarrhea or loose stools. ) 20 tablet 0  . DIPROLENE 0.05 % ointment Apply 1 application topically daily as needed (skin irritation).     Marland Kitchen dronabinol (MARINOL) 2.5 MG capsule Take 1 capsule (2.5 mg total) by mouth 2 (two) times daily before a meal. (Patient taking differently: Take 2.5 mg by mouth 2 (two) times daily as needed (nausea). ) 30 capsule 0  . enoxaparin (LOVENOX) 120 MG/0.8ML injection Inject 0.8 mLs (120 mg total) into the skin daily. 0.8 mL 30  . feeding supplement (ENSURE ENLIVE / ENSURE PLUS) LIQD Take 237 mLs by mouth 2 (two) times daily between meals. 237 mL 12  . ferrous sulfate 325 (65 FE) MG tablet Take 325 mg by mouth daily with breakfast.    . fluticasone (FLONASE) 50 MCG/ACT nasal spray Place 2 sprays into both nostrils daily. 16 g 12  . hydroxychloroquine (PLAQUENIL) 200 MG tablet Take 400 mg by mouth daily.   5  . lipase/protease/amylase 24000-76000 units CPEP Take 1 capsule (24,000 Units total) by mouth 3 (three) times daily with meals. 270 capsule 0  . loperamide (IMODIUM) 2 MG capsule Take 2  capsules (4 mg total) by mouth 2 (two) times daily as needed for diarrhea or loose stools (Use 1st). 30 capsule 0  . loratadine (CLARITIN) 10 MG tablet Take 10 mg by mouth daily.    Marland Kitchen LORazepam (ATIVAN) 0.5 MG tablet Take 1 tablet (0.5 mg total) by mouth every 8 (eight) hours as needed for anxiety (and anticiaptory n/v. DO NOT TAKE WITH XANAX). 30 tablet 0  . metoprolol succinate (TOPROL-XL) 50 MG 24 hr tablet Take 50 mg by mouth daily.     Marland Kitchen OLANZapine (ZYPREXA) 7.5 MG tablet Take 1 tablet (7.5 mg total) by mouth at bedtime. 30 tablet 0  . ondansetron (ZOFRAN) 8 MG tablet Take 1 tablet (  8 mg total) by mouth every 8 (eight) hours as needed for nausea or vomiting. 30 tablet 3  . oxyCODONE (OXY IR/ROXICODONE) 5 MG immediate release tablet Take 1 tablet (5 mg total) by mouth every 6 (six) hours as needed for severe pain. 60 tablet 0  . pantoprazole (PROTONIX) 40 MG tablet Take 40 mg by mouth daily.    . polyethylene glycol (MIRALAX / GLYCOLAX) 17 g packet Take 17 g by mouth daily. 30 each 1  . potassium chloride 20 MEQ/15ML (10%) SOLN Take 15 mLs (20 mEq total) by mouth 2 (two) times daily. 750 mL 1  . senna-docusate (SENOKOT-S) 8.6-50 MG tablet Take 1 tablet by mouth 2 (two) times daily. 60 tablet 1  . sucralfate (CARAFATE) 1 GM/10ML suspension Take 10 mLs (1 g total) by mouth 4 (four) times daily -  with meals and at bedtime. 420 mL 3  . topiramate (TOPAMAX) 50 MG tablet Take 1 tablet (50 mg total) by mouth at bedtime.     No current facility-administered medications for this visit.    PHYSICAL EXAMINATION: ECOG PERFORMANCE STATUS: 2 - Symptomatic, <50% confined to bed  There were no vitals filed for this visit. There were no vitals filed for this visit.  Due to [place] we will limit examination to appearance. Patient had no complaints.  GENERAL:alert, no distress and comfortable SKIN: skin color normal, no rashes or significant lesions EYES: normal, Conjunctiva are pink and non-injected,  sclera clear  NEURO: alert & oriented x 3 with fluent speech  LABORATORY DATA:  I have reviewed the data as listed CBC Latest Ref Rng & Units 12/28/2019 12/18/2019 12/10/2019  WBC 4.0 - 10.5 K/uL 8.2 7.1 3.3(L)  Hemoglobin 12.0 - 15.0 g/dL 9.3(L) 8.6(L) 8.7(L)  Hematocrit 36 - 46 % 28.8(L) 26.9(L) 27.2(L)  Platelets 150 - 400 K/uL 245 158 144(L)     CMP Latest Ref Rng & Units 12/28/2019 12/18/2019 12/12/2019  Glucose 70 - 99 mg/dL 106(H) 169(H) 126(H)  BUN 6 - 20 mg/dL 23(H) 21(H) 18  Creatinine 0.44 - 1.00 mg/dL 0.66 0.64 0.54  Sodium 135 - 145 mmol/L 137 139 134(L)  Potassium 3.5 - 5.1 mmol/L 4.3 4.4 4.1  Chloride 98 - 111 mmol/L 102 103 106  CO2 22 - 32 mmol/L '25 25 22  ' Calcium 8.9 - 10.3 mg/dL 9.4 8.8(L) 8.9  Total Protein 6.5 - 8.1 g/dL 8.2(H) 7.3 6.5  Total Bilirubin 0.3 - 1.2 mg/dL 0.6 0.5 0.6  Alkaline Phos 38 - 126 U/L 452(H) 224(H) 151(H)  AST 15 - 41 U/L 112(H) 53(H) 62(H)  ALT 0 - 44 U/L 123(H) 59(H) 57(H)      RADIOGRAPHIC STUDIES: I have personally reviewed the radiological images as listed and agreed with the findings in the report. No results found.   ASSESSMENT & PLAN:  Cynthia Frye is a 59 y.o. female with   1. 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 Her EUS biopsy with Dr Paulita Fujita on 09/25/19 shows pancreatic adenocarcinoma.  -Her 10/16/19 abdominal MRI showed multiple (>10) small liver lesions, most are consistent with metastatic lesions. Liver biopsy from 10/5/21confirmed metastatic pancreatic cancer. -I started her on first-line or neoadjuvant chemo with FOLFIRINOX q2weeks on 10/24/19. Goal of therapy is to control or shrink disease. Due to poor tolerance treatment stopped after C2 11/21/19. I switched her to second-line Gemcitabine 2 weeks on/1 week off starting 12/28/19. If  she tolerates well will add Abraxane.  -Labs reviewed,  adequate to proceed with C1D1 Gemcitabine today.  -F/u next week for day 8 and toxicity check.   2. Upper abdominal cramps, Weight loss, N/V/D -Secondary to #1and chemo. -She has had intermittent upper abdominal cramping after certain meals. She also has had a gradual 40 pounds weight loss over 7 months (since 03/2019). -Her appetitefluctuates.Continue to f/u with dietician.I recommend she use nutritional supplement 1-3 times daily.   -Pt notesMirtazapinegave her jitters and stopped. I gave her Marinol but has not tried it. She has held compazine as it has lead to blurred vision.  -Her 12/10/19 EGD showed Esophageal stricture or gastric/duodenal obstruction. She was given TPN upon 11/2019 hospital discharge. She is eating mildly by mouth.  -For pain she takes Oxycodone 2-3 tabs daily.   3. Comorbidities: Lupus, HTN, Migraines  -Symptomatic in her joints, Dx many years ago  -She is being followed by Rheumatologist Dr Wyline Copas and being treated with weekly Methotrexate injections, Plaquenil andDiclofenac. -For Migraines is well controlled on Topomax. She has been seen by Dr Jaynee Eagles.   4. Leftsmall pneumothorax -Secondary to port placement. Repeat CXR 10/18/19 shows enlarging but still small persistent pneum. Will continue to monitoring   5. Anxiety, Social/FinancialSupport  -She has been very anxious with the fast pace workup and unexpected diagnosis. -She lives with her husband in [place]. She does not have children. She does have a brother who lives close by but no other near relatives.  6. Left Axillary Lymphadenopathy  -Her CT chest from9/20/21 showed multiple left axillary and subpectoral adenopathy. -Her left mammogram/US from 10/23/19 shows multiple left axillary LNs with cortical thickening up to 5-6 mm. Not palpable on exam. Radiology and I feel this is likely reactive to covid19 vaccine and the recommendation is short interval follow up.  7. Anemia  -Secondary  tosecondary to recent chemotherapy and underlying lupus -Last blood transfusion on 12/13/19.   8.Left iliac vein DVT in 10/2019 -Seen on 11/04/19 CT AP with thrombosis of left internal iliac vein.  -She is currently on Lovenox injections, will continue -She has moderate left ankle swelling today (12/28/19). She notes missing 1-2 Lovenox injections given this can cause her pain with weight loss. Will continue injections for now given her difficulty with solid foods.   9. Hypokalemia  -secondary to recent diarrhea. On oral potassium once daily. She is fine to crush pill due to size.   PLAN: -Labs reviewed and adequate to proceed with C1D1 Gemcitabine -Lab, flush, F/u and Gemcitabine on 12/10.  -Continue TPN, follow-up with dietitian. I encouraged her to do TPN in the morning before Pump d/c so she is not with tubes Friday night.   No problem-specific Assessment & Plan notes found for this encounter.   No orders of the defined types were placed in this encounter.  All questions were answered. The patient knows to call the clinic with any problems, questions or concerns. No barriers to learning was detected. The total time spent in the appointment was 30 minutes.     Truitt Merle, MD 12/28/2019   I, Joslyn Devon, am acting as scribe for Truitt Merle, MD.   I have reviewed the above documentation for accuracy and completeness, and I agree with the above.

## 2019-12-27 DIAGNOSIS — K529 Noninfective gastroenteritis and colitis, unspecified: Secondary | ICD-10-CM | POA: Diagnosis not present

## 2019-12-27 DIAGNOSIS — E440 Moderate protein-calorie malnutrition: Secondary | ICD-10-CM | POA: Diagnosis not present

## 2019-12-27 DIAGNOSIS — R1310 Dysphagia, unspecified: Secondary | ICD-10-CM | POA: Diagnosis not present

## 2019-12-27 DIAGNOSIS — R112 Nausea with vomiting, unspecified: Secondary | ICD-10-CM | POA: Diagnosis not present

## 2019-12-28 ENCOUNTER — Inpatient Hospital Stay (HOSPITAL_BASED_OUTPATIENT_CLINIC_OR_DEPARTMENT_OTHER): Payer: BC Managed Care – PPO | Admitting: Hematology

## 2019-12-28 ENCOUNTER — Inpatient Hospital Stay: Payer: BC Managed Care – PPO | Attending: Hematology

## 2019-12-28 ENCOUNTER — Other Ambulatory Visit: Payer: BC Managed Care – PPO

## 2019-12-28 ENCOUNTER — Inpatient Hospital Stay: Payer: BC Managed Care – PPO

## 2019-12-28 ENCOUNTER — Other Ambulatory Visit: Payer: Self-pay

## 2019-12-28 VITALS — BP 144/77 | HR 134 | Temp 98.8°F | Resp 18 | Ht 67.0 in | Wt 174.8 lb

## 2019-12-28 DIAGNOSIS — C250 Malignant neoplasm of head of pancreas: Secondary | ICD-10-CM | POA: Diagnosis not present

## 2019-12-28 DIAGNOSIS — Z7189 Other specified counseling: Secondary | ICD-10-CM

## 2019-12-28 DIAGNOSIS — M329 Systemic lupus erythematosus, unspecified: Secondary | ICD-10-CM | POA: Insufficient documentation

## 2019-12-28 DIAGNOSIS — E876 Hypokalemia: Secondary | ICD-10-CM | POA: Diagnosis not present

## 2019-12-28 DIAGNOSIS — G43909 Migraine, unspecified, not intractable, without status migrainosus: Secondary | ICD-10-CM | POA: Insufficient documentation

## 2019-12-28 DIAGNOSIS — R590 Localized enlarged lymph nodes: Secondary | ICD-10-CM | POA: Insufficient documentation

## 2019-12-28 DIAGNOSIS — F419 Anxiety disorder, unspecified: Secondary | ICD-10-CM | POA: Diagnosis not present

## 2019-12-28 DIAGNOSIS — I10 Essential (primary) hypertension: Secondary | ICD-10-CM | POA: Insufficient documentation

## 2019-12-28 DIAGNOSIS — C787 Secondary malignant neoplasm of liver and intrahepatic bile duct: Secondary | ICD-10-CM | POA: Diagnosis not present

## 2019-12-28 DIAGNOSIS — D6481 Anemia due to antineoplastic chemotherapy: Secondary | ICD-10-CM | POA: Insufficient documentation

## 2019-12-28 DIAGNOSIS — R634 Abnormal weight loss: Secondary | ICD-10-CM | POA: Diagnosis not present

## 2019-12-28 DIAGNOSIS — I82422 Acute embolism and thrombosis of left iliac vein: Secondary | ICD-10-CM | POA: Insufficient documentation

## 2019-12-28 DIAGNOSIS — Z5111 Encounter for antineoplastic chemotherapy: Secondary | ICD-10-CM | POA: Insufficient documentation

## 2019-12-28 DIAGNOSIS — J939 Pneumothorax, unspecified: Secondary | ICD-10-CM | POA: Diagnosis not present

## 2019-12-28 DIAGNOSIS — R112 Nausea with vomiting, unspecified: Secondary | ICD-10-CM | POA: Diagnosis not present

## 2019-12-28 DIAGNOSIS — Z7901 Long term (current) use of anticoagulants: Secondary | ICD-10-CM | POA: Diagnosis not present

## 2019-12-28 DIAGNOSIS — K529 Noninfective gastroenteritis and colitis, unspecified: Secondary | ICD-10-CM | POA: Diagnosis not present

## 2019-12-28 DIAGNOSIS — E440 Moderate protein-calorie malnutrition: Secondary | ICD-10-CM | POA: Diagnosis not present

## 2019-12-28 LAB — CBC WITH DIFFERENTIAL (CANCER CENTER ONLY)
Abs Immature Granulocytes: 0.02 10*3/uL (ref 0.00–0.07)
Basophils Absolute: 0 10*3/uL (ref 0.0–0.1)
Basophils Relative: 1 %
Eosinophils Absolute: 0.1 10*3/uL (ref 0.0–0.5)
Eosinophils Relative: 1 %
HCT: 28.8 % — ABNORMAL LOW (ref 36.0–46.0)
Hemoglobin: 9.3 g/dL — ABNORMAL LOW (ref 12.0–15.0)
Immature Granulocytes: 0 %
Lymphocytes Relative: 14 %
Lymphs Abs: 1.2 10*3/uL (ref 0.7–4.0)
MCH: 29.5 pg (ref 26.0–34.0)
MCHC: 32.3 g/dL (ref 30.0–36.0)
MCV: 91.4 fL (ref 80.0–100.0)
Monocytes Absolute: 0.8 10*3/uL (ref 0.1–1.0)
Monocytes Relative: 9 %
Neutro Abs: 6.1 10*3/uL (ref 1.7–7.7)
Neutrophils Relative %: 75 %
Platelet Count: 245 10*3/uL (ref 150–400)
RBC: 3.15 MIL/uL — ABNORMAL LOW (ref 3.87–5.11)
RDW: 15.6 % — ABNORMAL HIGH (ref 11.5–15.5)
WBC Count: 8.2 10*3/uL (ref 4.0–10.5)
nRBC: 0 % (ref 0.0–0.2)

## 2019-12-28 LAB — CMP (CANCER CENTER ONLY)
ALT: 123 U/L — ABNORMAL HIGH (ref 0–44)
AST: 112 U/L — ABNORMAL HIGH (ref 15–41)
Albumin: 2.8 g/dL — ABNORMAL LOW (ref 3.5–5.0)
Alkaline Phosphatase: 452 U/L — ABNORMAL HIGH (ref 38–126)
Anion gap: 10 (ref 5–15)
BUN: 23 mg/dL — ABNORMAL HIGH (ref 6–20)
CO2: 25 mmol/L (ref 22–32)
Calcium: 9.4 mg/dL (ref 8.9–10.3)
Chloride: 102 mmol/L (ref 98–111)
Creatinine: 0.66 mg/dL (ref 0.44–1.00)
GFR, Estimated: >60 mL/min (ref >60)
Glucose, Bld: 106 mg/dL — ABNORMAL HIGH (ref 70–99)
Potassium: 4.3 mmol/L (ref 3.5–5.1)
Sodium: 137 mmol/L (ref 135–145)
Total Bilirubin: 0.6 mg/dL (ref 0.3–1.2)
Total Protein: 8.2 g/dL — ABNORMAL HIGH (ref 6.5–8.1)

## 2019-12-28 MED ORDER — HEPARIN SOD (PORK) LOCK FLUSH 100 UNIT/ML IV SOLN
500.0000 [IU] | Freq: Once | INTRAVENOUS | Status: AC | PRN
  Administered 2019-12-28: 500 [IU]
  Filled 2019-12-28: qty 5

## 2019-12-28 MED ORDER — SODIUM CHLORIDE 0.9% FLUSH
10.0000 mL | INTRAVENOUS | Status: DC | PRN
  Administered 2019-12-28: 10 mL
  Filled 2019-12-28: qty 10

## 2019-12-28 MED ORDER — SODIUM CHLORIDE 0.9 % IV SOLN
Freq: Once | INTRAVENOUS | Status: AC
  Filled 2019-12-28: qty 250

## 2019-12-28 MED ORDER — SODIUM CHLORIDE 0.9 % IV SOLN
600.0000 mg/m2 | Freq: Once | INTRAVENOUS | Status: AC
  Administered 2019-12-28: 1178 mg via INTRAVENOUS
  Filled 2019-12-28: qty 30.98

## 2019-12-28 MED ORDER — PROCHLORPERAZINE MALEATE 10 MG PO TABS
ORAL_TABLET | ORAL | Status: AC
  Filled 2019-12-28: qty 1

## 2019-12-28 MED ORDER — PROCHLORPERAZINE MALEATE 10 MG PO TABS
10.0000 mg | ORAL_TABLET | Freq: Once | ORAL | Status: AC
  Administered 2019-12-28: 10 mg via ORAL

## 2019-12-28 NOTE — Patient Instructions (Addendum)
[facility] Discharge Instructions for Patients Receiving Chemotherapy  Today you received the following chemotherapy agents Gemcitabine (GEMZAR).  To help prevent nausea and vomiting after your treatment, we encourage you to take your nausea medication as prescribed.  If you develop nausea and vomiting that is not controlled by your nausea medication, call the clinic.   BELOW ARE SYMPTOMS THAT SHOULD BE REPORTED IMMEDIATELY:  *FEVER GREATER THAN 100.5 F  *CHILLS WITH OR WITHOUT FEVER  NAUSEA AND VOMITING THAT IS NOT CONTROLLED WITH YOUR NAUSEA MEDICATION  *UNUSUAL SHORTNESS OF BREATH  *UNUSUAL BRUISING OR BLEEDING  TENDERNESS IN MOUTH AND THROAT WITH OR WITHOUT PRESENCE OF ULCERS  *URINARY PROBLEMS  *BOWEL PROBLEMS  UNUSUAL RASH Items with * indicate a potential emergency and should be followed up as soon as possible.  Feel free to call the clinic should you have any questions or concerns. The clinic phone number is [phone].  Please show the [facility] at check-in to the Emergency Department and triage nurse.  Gemcitabine injection What is this medicine? GEMCITABINE (jem SYE ta been) is a chemotherapy drug. This medicine is used to treat many types of cancer like breast cancer, lung cancer, pancreatic cancer, and ovarian cancer. This medicine may be used for other purposes; ask your health care provider or pharmacist if you have questions. COMMON BRAND NAME(S): Gemzar, Infugem What should I tell my health care provider before I take this medicine? They need to know if you have any of these conditions:  blood disorders  infection  kidney disease  liver disease  lung or breathing disease, like asthma  recent or ongoing radiation therapy  an unusual or allergic reaction to gemcitabine, other chemotherapy, other medicines, foods, dyes, or preservatives  pregnant or trying to get pregnant  breast-feeding How should I use this  medicine? This drug is given as an infusion into a vein. It is administered in a hospital or clinic by a specially trained health care professional. Talk to your pediatrician regarding the use of this medicine in children. Special care may be needed. Overdosage: If you think you have taken too much of this medicine contact a poison control center or emergency room at once. NOTE: This medicine is only for you. Do not share this medicine with others. What if I miss a dose? It is important not to miss your dose. Call your doctor or health care professional if you are unable to keep an appointment. What may interact with this medicine?  medicines to increase blood counts like filgrastim, pegfilgrastim, sargramostim  some other chemotherapy drugs like cisplatin  vaccines Talk to your doctor or health care professional before taking any of these medicines:  acetaminophen  aspirin  ibuprofen  ketoprofen  naproxen This list may not describe all possible interactions. Give your health care provider a list of all the medicines, herbs, non-prescription drugs, or dietary supplements you use. Also tell them if you smoke, drink alcohol, or use illegal drugs. Some items may interact with your medicine. What should I watch for while using this medicine? Visit your doctor for checks on your progress. This drug may make you feel generally unwell. This is not uncommon, as chemotherapy can affect healthy cells as well as cancer cells. Report any side effects. Continue your course of treatment even though you feel ill unless your doctor tells you to stop. In some cases, you may be given additional medicines to help with side effects. Follow all directions for their use. Call  your doctor or health care professional for advice if you get a fever, chills or sore throat, or other symptoms of a cold or flu. Do not treat yourself. This drug decreases your body's ability to fight infections. Try to avoid being  around people who are sick. This medicine may increase your risk to bruise or bleed. Call your doctor or health care professional if you notice any unusual bleeding. Be careful brushing and flossing your teeth or using a toothpick because you may get an infection or bleed more easily. If you have any dental work done, tell your dentist you are receiving this medicine. Avoid taking products that contain aspirin, acetaminophen, ibuprofen, naproxen, or ketoprofen unless instructed by your doctor. These medicines may hide a fever. Do not become pregnant while taking this medicine or for 6 months after stopping it. Women should inform their doctor if they wish to become pregnant or think they might be pregnant. Men should not father a child while taking this medicine and for 3 months after stopping it. There is a potential for serious side effects to an unborn child. Talk to your health care professional or pharmacist for more information. Do not breast-feed an infant while taking this medicine or for at least 1 week after stopping it. Men should inform their doctors if they wish to father a child. This medicine may lower sperm counts. Talk with your doctor or health care professional if you are concerned about your fertility. What side effects may I notice from receiving this medicine? Side effects that you should report to your doctor or health care professional as soon as possible:  allergic reactions like skin rash, itching or hives, swelling of the face, lips, or tongue  breathing problems  pain, redness, or irritation at site where injected  signs and symptoms of a dangerous change in heartbeat or heart rhythm like chest pain; dizziness; fast or irregular heartbeat; palpitations; feeling faint or lightheaded, falls; breathing problems  signs of decreased platelets or bleeding - bruising, pinpoint red spots on the skin, black, tarry stools, blood in the urine  signs of decreased red blood cells -  unusually weak or tired, feeling faint or lightheaded, falls  signs of infection - fever or chills, cough, sore throat, pain or difficulty passing urine  signs and symptoms of kidney injury like trouble passing urine or change in the amount of urine  signs and symptoms of liver injury like dark yellow or brown urine; general ill feeling or flu-like symptoms; light-colored stools; loss of appetite; nausea; right upper belly pain; unusually weak or tired; yellowing of the eyes or skin  swelling of ankles, feet, hands Side effects that usually do not require medical attention (report to your doctor or health care professional if they continue or are bothersome):  constipation  diarrhea  hair loss  loss of appetite  nausea  rash  vomiting This list may not describe all possible side effects. Call your doctor for medical advice about side effects. You may report side effects to FDA at 1-800-FDA-1088. Where should I keep my medicine? This drug is given in a hospital or clinic and will not be stored at home. NOTE: This sheet is a summary. It may not cover all possible information. If you have questions about this medicine, talk to your doctor, pharmacist, or health care provider.  2020 [employer] (2017-04-06 18:06:11)

## 2019-12-28 NOTE — Progress Notes (Signed)
Per Dr. Burr Medico: Faythe Ghee to treat with elevated HR.

## 2019-12-28 NOTE — Progress Notes (Signed)
Patient discharged from the [facility] in stable condition and with no signs of acute distress. Sandi Mealy notified of patient's elevated heart rate upon discharge. Patient stated this was her baseline and felt fine after requested 30-minute post-observation period following infusion.

## 2019-12-28 NOTE — Progress Notes (Signed)
Per Dr Burr Medico ok to treat with alk phos [phone]

## 2019-12-29 DIAGNOSIS — K529 Noninfective gastroenteritis and colitis, unspecified: Secondary | ICD-10-CM | POA: Diagnosis not present

## 2019-12-29 DIAGNOSIS — E440 Moderate protein-calorie malnutrition: Secondary | ICD-10-CM | POA: Diagnosis not present

## 2019-12-29 DIAGNOSIS — R112 Nausea with vomiting, unspecified: Secondary | ICD-10-CM | POA: Diagnosis not present

## 2019-12-30 ENCOUNTER — Encounter: Payer: Self-pay | Admitting: Hematology

## 2019-12-30 DIAGNOSIS — R112 Nausea with vomiting, unspecified: Secondary | ICD-10-CM | POA: Diagnosis not present

## 2019-12-30 DIAGNOSIS — K529 Noninfective gastroenteritis and colitis, unspecified: Secondary | ICD-10-CM | POA: Diagnosis not present

## 2019-12-30 DIAGNOSIS — E440 Moderate protein-calorie malnutrition: Secondary | ICD-10-CM | POA: Diagnosis not present

## 2019-12-31 ENCOUNTER — Telehealth: Payer: Self-pay

## 2019-12-31 ENCOUNTER — Telehealth: Payer: Self-pay | Admitting: Hematology

## 2019-12-31 DIAGNOSIS — R1310 Dysphagia, unspecified: Secondary | ICD-10-CM | POA: Diagnosis not present

## 2019-12-31 DIAGNOSIS — K529 Noninfective gastroenteritis and colitis, unspecified: Secondary | ICD-10-CM | POA: Diagnosis not present

## 2019-12-31 DIAGNOSIS — R112 Nausea with vomiting, unspecified: Secondary | ICD-10-CM | POA: Diagnosis not present

## 2019-12-31 DIAGNOSIS — E440 Moderate protein-calorie malnutrition: Secondary | ICD-10-CM | POA: Diagnosis not present

## 2019-12-31 NOTE — Telephone Encounter (Signed)
No 12/3 los. No changes made to pt's schedule.

## 2019-12-31 NOTE — Telephone Encounter (Signed)
Patient received Gemzar on Friday 12/28/2019 and wanted to let Dr. Burr Medico know that she had severe stomach cramping on Saturday and Sunday, better today. She states she almost went to the ED.  Did not have vomiting like she did with previous treatment. She does admit to constipation, had small BM this morning.  She plans to take her Senna and drink prune juice.  I explained to her that need to keep from getting constipated.  She verbalized an understanding.

## 2020-01-01 DIAGNOSIS — E440 Moderate protein-calorie malnutrition: Secondary | ICD-10-CM | POA: Diagnosis not present

## 2020-01-01 DIAGNOSIS — K529 Noninfective gastroenteritis and colitis, unspecified: Secondary | ICD-10-CM | POA: Diagnosis not present

## 2020-01-01 DIAGNOSIS — R112 Nausea with vomiting, unspecified: Secondary | ICD-10-CM | POA: Diagnosis not present

## 2020-01-02 ENCOUNTER — Other Ambulatory Visit: Payer: Self-pay | Admitting: Physician Assistant

## 2020-01-02 DIAGNOSIS — R112 Nausea with vomiting, unspecified: Secondary | ICD-10-CM | POA: Diagnosis not present

## 2020-01-02 DIAGNOSIS — E440 Moderate protein-calorie malnutrition: Secondary | ICD-10-CM | POA: Diagnosis not present

## 2020-01-02 DIAGNOSIS — D6481 Anemia due to antineoplastic chemotherapy: Secondary | ICD-10-CM

## 2020-01-02 DIAGNOSIS — K529 Noninfective gastroenteritis and colitis, unspecified: Secondary | ICD-10-CM | POA: Diagnosis not present

## 2020-01-02 DIAGNOSIS — T451X5A Adverse effect of antineoplastic and immunosuppressive drugs, initial encounter: Secondary | ICD-10-CM

## 2020-01-02 NOTE — Progress Notes (Signed)
[name] OFFICE PROGRESS NOTE  Cynthia Huddle, MD [address] [place] [address]  DIAGNOSIS: f/u of Pancreatic Cancer  Oncology History Overview Note  Cancer Staging Pancreatic cancer [facility]) Staging form: Exocrine Pancreas, AJCC 8th Edition - Clinical stage from 10/18/2019: Stage IV (cT3, cN1, cM1) - Signed by Truitt Merle, MD on 10/18/2019    Pancreatic cancer ([place])  09/19/2019 Imaging   CT AP w contrast 09/19/19  IMPRESSION: 1. Findings are highly concerning for probable primary pancreatic adenocarcinoma in the anterior aspect of the pancreatic head. Several prominent borderline enlarged lymph nodes are noted in the hepatoduodenal nodal station, and there are multiple indeterminate liver lesions which are highly concerning for probable hepatic metastases. Further evaluation with nonemergent abdominal MRI with and without IV gadolinium with MRCP is recommended in the near future to better evaluate these findings.   09/25/2019 Procedure   Upper EUS by Dr Paulita Fujita  IMPRESSION -There was no evidence of significant pathology in the left lobe of the liver.  -A few lymph nodes were visualized and measures in the peripancreatic region and porta hepata region.  -Hyperchoic material consistent with sludge was visualized endosonographically in the gallbladder.  -There was no sign of significant pathology in the common bile duct.  -A mass was identified in the pancreatic head. If biopsy results show adenocarcinoma, it would be staged T3N1Mx by endosonographic criteria. Fine needle aspiration performed.    09/25/2019 Initial Biopsy   FINAL MICROSCOPIC DIAGNOSIS: Fine needle aspirate, Pancreas;  MALIGNANT CELLS PRESENT CONSISTENT WITH ADENOCARCINOMA.    10/02/2019 Initial Diagnosis   Pancreatic cancer ([place])   10/11/2019 Procedure   PAC placement y Dr Barry Dienes    10/15/2019 Imaging   CT Chest  IMPRESSION: 1. Small anterior left pneumothorax with dependent  atelectasis in the left lower lobe. 2. Increased number of bilateral axillary and subpectoral lymph nodes with mild lymphadenopathy in the left axilla. While this would be an atypical presentation for metastatic pancreatic cancer, this possibility is not excluded 3. Main duct dilatation in the pancreas, better assessed on abdomen CT 09/19/2019.   10/16/2019 Imaging   MRI Abdomen  IMPRESSION: 1. Substantially motion degraded scan. 2. Probable persistent small anterior left lung base pneumothorax, better seen on chest CT from 1 day prior. 3. Poorly marginated hypoenhancing 3.7 x 2.9 cm pancreatic head mass, which appears to invade the anterior peripancreatic fat, compatible with known pancreatic adenocarcinoma. Diffuse irregular dilatation of the main pancreatic duct in the pancreatic body and tail. Mild narrowing of the main portal vein by the mass. Abdominal vasculature remains patent and otherwise uninvolved. 4. Numerous (greater than 10) small liver masses scattered throughout the liver, largest 1.0 cm, which appear to demonstrate targetoid enhancement on the limited motion degraded postcontrast sequences, compatible with liver metastases. 5. Mild porta hepatis adenopathy, suspicious for metastatic disease.   10/18/2019 Cancer Staging   Staging form: Exocrine Pancreas, AJCC 8th Edition - Clinical stage from 10/18/2019: Stage IV (cT3, cN1, cM1) - Signed by Truitt Merle, MD on 10/18/2019   10/24/2019 - 11/21/2019 Chemotherapy   First-line FOLFIRINOX q2weeks starting 10/24/19. C2 postponed due to N/V/D and dose reduced 20-30%. Given poor tolerance, stopped after 2 cycles.    10/30/2019 Pathology Results   FINAL MICROSCOPIC DIAGNOSIS:   A. LIVER, LESION, BIOPSY:  -  Metastatic carcinoma  -  See comment   COMMENT:   By immunohistochemistry, the neoplastic cells are positive for  cytokeratin 7 and GATA3 with patchy nonspecific staining for  PAX 8 but  negative for TTF-1, CDX2 and  cytokeratin 20.  Overall, the findings are  consistent with metastasis of the patient's known breast carcinoma.  Prognostic panel (ER, PR, Her-2) is pending and will be reported in an  addendum.    ADDENDUM:   Dr. Laurence Ferrari notified us (November 01, 2019) that the patient was also  being worked up for a pancreatic mass.  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  In addition, ER and PR  are negative.  Gata-3 can be expressed in the pancreatic  adenocarcinomas; therefore, pancreatobiliary primary remains in the  differential.    11/02/2019 Genetic Testing   Negative genetic testing: no pathogenic variants detected in [facility] Common Hereditary Cancers Panel.  The report date is November 02, 2019.   The Common Hereditary Cancers Panel offered by [facility] includes sequencing and/or deletion duplication testing of the following 48 genes: APC, ATM, AXIN2, BARD1, BMPR1A, BRCA1, BRCA2, BRIP1, CDH1, CDK4, CDKN2A (p14ARF), CDKN2A (p16INK4a), CHEK2, CTNNA1, DICER1, EPCAM (Deletion/duplication testing only), GREM1 (promoter region deletion/duplication testing only), KIT, MEN1, MLH1, MSH2, MSH3, MSH6, MUTYH, NBN, NF1, NHTL1, PALB2, PDGFRA, PMS2, POLD1, POLE, PTEN, RAD50, RAD51C, RAD51D, RNF43, SDHB, SDHC, SDHD, SMAD4, SMARCA4. STK11, TP53, TSC1, TSC2, and VHL.  The following genes were evaluated for sequence changes only: SDHA and HOXB13 c.251G>A variant only.   11/04/2019 Imaging   CT AP  IMPRESSION: 1. Circumferential bowel wall thickening with adjacent fat stranding throughout the colon, most predominant in the transverse colon. This is consistent with colitis, which may be infectious or inflammatory in etiology. 2. Ill-defined pancreatic head mass consistent with known malignancy, similar to mildly increased in comparison to prior CT. There are innumerable hypodense masses throughout the liver, increased in conspicuity in comparison to prior CT. Findings  are worrisome for worsening metastatic disease. 3. Filling defect in the LEFT internal iliac vein, likely a thrombus with differential considerations including mixing artifact.   11/24/2019 Imaging   CT AP  IMPRESSION: Pancreatic head mass again noted compatible with patient's given history of pancreatic cancer.   Numerous metastases throughout the liver, enlarging since prior study.     12/10/2019 Procedure   Upper endoscopy by Dr Michail Sermon  IMPRESSION - Z-line regular, 42 cm from the incisors. - Esophageal ulcer with stigmata of recent bleeding. - Gastritis. Biopsied. - Mucosal changes in the duodenum. Biopsied. - Normal second portion of the duodenum. - Gastric and duodenal thickening concerning for inflammation from mets or due to mets from known pancreatic cancer.   FINAL MICROSCOPIC DIAGNOSIS:   A. DUODENUM, BIOPSY:  - Benign duodenal mucosa.  - No dysplasia or malignancy.   B. STOMACH, BIOPSY:  - Antral mucosa with mild chronic active inflammation.  - No intestinal metaplasia, dysplasia or carcinoma.   12/28/2019 -  Chemotherapy   Change her to second-line Gemcitabine 2 weeks on/1 week off starting 12/28/19. May add Cisplatin if tolerable.       CURRENT THERAPY: Change to second-line Gemcitabine 2 weeks on/1 week off starting 12/28/19. May add Cisplatin if tolerable. Status post day 1 cycle 1. She is here for day 8 cycle 1 today.   INTERVAL HISTORY: Cynthia Frye 59 y.o. female returns to the clinic today for a follow-up visit.  The patient is feeling feeling unwell today. Her last bowel movement was reportedly 1 week ago. Since then, she has been having intermittent abdominal cramping. She had an episode of abdominal cramping on Sunday. It reportedly eased off  but started up again last night. She describes the pain as a cramping feeling. She takes oxycodone for pain and does not routinely do bowel prophylaxis. She has not tried taking a laxative this week. She  tried to drink prune juice yesterday and took senokot-S yesterday but none today. She reports one episode of nausea and vomiting last night. She denies any nausea or vomiting today. Her pain is a 10/10 at the worse. At the best, the pain is a 7/10. She states she takes 1-3 oxycodone per day. She denies any fevers, jaundice, or itching. Her appetite is the "same" where she has some good days and some bad days. She typically eats oatmeal and grilled chicken. She has TPN once per day. She states she did not eat today because of the abdominal discomfort affecting her appetite. She is following with a member of the nutritionist team. She has not used boost or ensure because it is too sweet for her.  She was given a prescription for Marinol to help with her appetite but she has not used . She did not tolerate Remeron in the past.    She also was found to have a DVT last month. She is reporting increased left lower extremity swelling. She is taking lovenox and states that she has not missed any doses recently. She is here today for evaluation before starting day 8 of cycle 1.     MEDICAL HISTORY: Past Medical History:  Diagnosis Date  . Allergies    peanuts, corn, beans, red grapefruit, naval oranges  . Asthma    allergy shots and medication  . Cancer [facility])    pancreatic  . Collagen vascular disease ([place])   . DDD (degenerative disc disease), lumbar   . Eustachian tube dysfunction 12/2012   rhinitis, vertigo- Dr. Wilburn Cornelia, ENT   . Fibroids   . Heart murmur    Echo 1/18: EF 55-60, no RWMA, normal diastolic function, trivial AI, PASP 32  . History of cardiac catheterization    LHC 3/18: normal coronary arteries.   . History of nuclear stress test    ETT-Myoview 2/18: EF 62, + ECG response; apical and distal septal ischemia; intermediate risk.  Marland Kitchen Hypertension   . Iron deficiency anemia   . Lupus [facility]) 2011   Dr. Trudie Reed  . Migraine headache    trial of generic maxalt 10 mg, January 2021  .  Obesity   . Seasonal allergies   . Seizure in childhood [facility])    as a child no treatment none x 30 years    ALLERGIES:  is allergic to cinnamon, peanut-containing drug products, prednisone, corn oil, corn-containing products, and other.  MEDICATIONS:  Current Outpatient Medications  Medication Sig Dispense Refill  . acetaminophen (TYLENOL) 500 MG tablet Take 1,000 mg by mouth every 6 (six) hours as needed for moderate pain.    Marland Kitchen albuterol (PROVENTIL HFA;VENTOLIN HFA) 108 (90 Base) MCG/ACT inhaler Inhale 2 puffs into the lungs every 6 (six) hours as needed for wheezing or shortness of breath. 18 g 0  . Bepotastine Besilate (BEPREVE) 1.5 % SOLN Place 1 drop into both eyes 2 (two) times daily as needed (dry eyes).     . Cholecalciferol (VITAMIN D) 2000 units CAPS Take 2,000 Units by mouth daily.     Marland Kitchen dexamethasone (DECADRON) 4 MG tablet Take 1 tablet (4 mg total) by mouth daily. Take 1 tablet once daily for 3 to 5 days after chemo 20 tablet 0  . diclofenac (VOLTAREN) 75  MG EC tablet Take 1 tablet (75 mg total) by mouth 2 (two) times daily as needed.    . Dietary Management Product (RHEUMATE) CAPS Take 1 capsule by mouth daily.     . diphenoxylate-atropine (LOMOTIL) 2.5-0.025 MG tablet Take 2 tablets by mouth 4 (four) times daily as needed for diarrhea or loose stools (use 2nd, if Imodium doesn't work. Max dose: 8 tablets/day.). (Patient taking differently: Take 2 tablets by mouth 4 (four) times daily as needed for diarrhea or loose stools. ) 20 tablet 0  . DIPROLENE 0.05 % ointment Apply 1 application topically daily as needed (skin irritation).     Marland Kitchen dronabinol (MARINOL) 2.5 MG capsule Take 1 capsule (2.5 mg total) by mouth 2 (two) times daily before a meal. (Patient taking differently: Take 2.5 mg by mouth 2 (two) times daily as needed (nausea). ) 30 capsule 0  . enoxaparin (LOVENOX) 120 MG/0.8ML injection Inject 0.8 mLs (120 mg total) into the skin daily. 0.8 mL 30  . feeding supplement  (ENSURE ENLIVE / ENSURE PLUS) LIQD Take 237 mLs by mouth 2 (two) times daily between meals. 237 mL 12  . ferrous sulfate 325 (65 FE) MG tablet Take 325 mg by mouth daily with breakfast.    . fluticasone (FLONASE) 50 MCG/ACT nasal spray Place 2 sprays into both nostrils daily. 16 g 12  . hydroxychloroquine (PLAQUENIL) 200 MG tablet Take 400 mg by mouth daily.   5  . lipase/protease/amylase 24000-76000 units CPEP Take 1 capsule (24,000 Units total) by mouth 3 (three) times daily with meals. 270 capsule 0  . loperamide (IMODIUM) 2 MG capsule Take 2 capsules (4 mg total) by mouth 2 (two) times daily as needed for diarrhea or loose stools (Use 1st). 30 capsule 0  . loratadine (CLARITIN) 10 MG tablet Take 10 mg by mouth daily.    Marland Kitchen LORazepam (ATIVAN) 0.5 MG tablet Take 1 tablet (0.5 mg total) by mouth every 8 (eight) hours as needed for anxiety (and anticiaptory n/v. DO NOT TAKE WITH XANAX). 30 tablet 0  . metoprolol succinate (TOPROL-XL) 50 MG 24 hr tablet Take 50 mg by mouth daily.     Marland Kitchen OLANZapine (ZYPREXA) 7.5 MG tablet Take 1 tablet (7.5 mg total) by mouth at bedtime. 30 tablet 0  . ondansetron (ZOFRAN) 8 MG tablet Take 1 tablet (8 mg total) by mouth every 8 (eight) hours as needed for nausea or vomiting. 30 tablet 3  . oxyCODONE (OXY IR/ROXICODONE) 5 MG immediate release tablet Take 1 tablet (5 mg total) by mouth every 6 (six) hours as needed for severe pain. 60 tablet 0  . pantoprazole (PROTONIX) 40 MG tablet Take 40 mg by mouth daily.    . polyethylene glycol (MIRALAX / GLYCOLAX) 17 g packet Take 17 g by mouth daily. 30 each 1  . potassium chloride 20 MEQ/15ML (10%) SOLN Take 15 mLs (20 mEq total) by mouth 2 (two) times daily. 750 mL 1  . senna-docusate (SENOKOT-S) 8.6-50 MG tablet Take 1 tablet by mouth 2 (two) times daily. 60 tablet 1  . sucralfate (CARAFATE) 1 GM/10ML suspension Take 10 mLs (1 g total) by mouth 4 (four) times daily -  with meals and at bedtime. 420 mL 3  . topiramate (TOPAMAX)  50 MG tablet Take 1 tablet (50 mg total) by mouth at bedtime.     No current facility-administered medications for this visit.    SURGICAL HISTORY:  Past Surgical History:  Procedure Laterality Date  . ABDOMINAL HYSTERECTOMY    .  BACK SURGERY  2009  . BIOPSY  12/10/2019   Procedure: BIOPSY;  Surgeon: Wilford Corner, MD;  Location: [facility];  Service: Endoscopy;;  . COLONOSCOPY WITH PROPOFOL N/A 04/11/2012   Procedure: COLONOSCOPY WITH PROPOFOL;  Surgeon: Garlan Fair, MD;  Location: [facility];  Service: Endoscopy;  Laterality: N/A;  . ESOPHAGOGASTRODUODENOSCOPY (EGD) WITH PROPOFOL N/A 12/10/2019   Procedure: ESOPHAGOGASTRODUODENOSCOPY (EGD) WITH PROPOFOL;  Surgeon: Wilford Corner, MD;  Location: [facility];  Service: Endoscopy;  Laterality: N/A;  . HERNIA REPAIR     as child unbilical  . C1-Y partial discectomy and laminectomy     Dr. Christella Noa  . LEFT HEART CATH AND CORONARY ANGIOGRAPHY N/A 04/09/2016   Procedure: Left Heart Cath and Coronary Angiography;  Surgeon: Nelva Bush, MD;  Location: [facility];  Service: Cardiovascular;  Laterality: N/A;  . MOUTH SURGERY    . PORTACATH PLACEMENT N/A 10/11/2019   Procedure: INSERTION PORT-A-CATH LEFT SUBCLAVIAN;  Surgeon: [name], MD;  Location: [facility];  Service: General;  Laterality: N/A;    REVIEW OF SYSTEMS:   Review of Systems  Constitutional: Positive for fatigue and stable but decreased appetite. Negative for  Chills and fever.  HENT:   Negative for mouth sores, nosebleeds, sore throat and trouble swallowing.   Eyes: Negative for eye problems and icterus.  Respiratory: Negative for cough, hemoptysis, shortness of breath and wheezing.   Cardiovascular: Positive for left lower extremity swelling. Negative for chest pain. Gastrointestinal: Positive for diffuse abdominal cramping and constipation. Negative for diarrhea, nausea and vomiting.  Genitourinary: Negative for bladder  incontinence, difficulty urinating, dysuria, frequency and hematuria.   Musculoskeletal: Negative for back pain, gait problem, neck pain and neck stiffness.  Skin: Negative for itching and rash.  Neurological: Negative for dizziness, extremity weakness, gait problem, headaches, light-headedness and seizures.  Hematological: Negative for adenopathy. Does not bruise/bleed easily.  Psychiatric/Behavioral: Negative for confusion, depression and sleep disturbance. The patient is not nervous/anxious.     PHYSICAL EXAMINATION:  There were no vitals taken for this visit.  ECOG PERFORMANCE STATUS: 1 - Symptomatic but completely ambulatory  Physical Exam  Constitutional: Oriented to person, place, and time. Patient appears uncomfortable.  HENT:  Head: Normocephalic and atraumatic.  Mouth/Throat: Oropharynx is clear and moist. No oropharyngeal exudate.  Eyes: Conjunctivae are normal. Right eye exhibits no discharge. Left eye exhibits no discharge. No scleral icterus.  Neck: Normal range of motion. Neck supple.  Cardiovascular: Tachycardic, regular rhythm, normal heart sounds and intact distal pulses.   Pulmonary/Chest: Effort normal and breath sounds normal. No respiratory distress. No wheezes. No rales.  Abdominal: Soft. Hypoactive bowel sounds. Exhibits no distension and no mass. Generalized discomfort in the abdomen.  Musculoskeletal: Left leg swelling. Normal range of motion.  Lymphadenopathy:    No cervical adenopathy.  Neurological: Alert and oriented to person, place, and time. Exhibits normal muscle tone. Gait normal. Coordination normal.  Skin: Skin is warm and dry. No rash noted. Not diaphoretic. No erythema. No pallor.  Psychiatric: Mood, memory and judgment normal.  Vitals reviewed.  LABORATORY DATA: Lab Results  Component Value Date   WBC 7.7 01/04/2020   HGB 8.9 (L) 01/04/2020   HCT 27.3 (L) 01/04/2020   MCV 90.7 01/04/2020   PLT 134 (L) 01/04/2020      Chemistry       Component Value Date/Time   NA 134 (L) 01/04/2020 1454   NA 142 04/02/2016 1122   K 3.9 01/04/2020 1454   CL  100 01/04/2020 1454   CO2 23 01/04/2020 1454   BUN 19 01/04/2020 1454   BUN 10 04/02/2016 1122   CREATININE 0.73 01/04/2020 1454      Component Value Date/Time   CALCIUM 9.3 01/04/2020 1454   ALKPHOS 487 (H) 01/04/2020 1454   AST 186 (HH) 01/04/2020 1454   ALT 219 (H) 01/04/2020 1454   BILITOT 0.9 01/04/2020 1454       RADIOGRAPHIC STUDIES:  DG UGI W SINGLE CM (SOL OR THIN BA)  Result Date: 12/06/2019 CLINICAL DATA:  Nausea and vomiting. Additional history obtained from [place] NUMBERHistory of pancreatic cancer. EXAM: UPPER GI SERIES WITH KUB TECHNIQUE: After obtaining a scout radiograph a routine upper GI series was performed using thin and high density barium. FLUOROSCOPY TIME:  Fluoroscopy Time:  4 minutes, 24 seconds. Radiation Exposure Index (if provided by the fluoroscopic device): 80.90 mGy Number of Acquired Spot Images: 5 COMPARISON:  CT abdomen/pelvis 11/24/2019. FINDINGS: A scout radiograph of the abdomen demonstrates a nonobstructive bowel gas pattern. Fluoroscopic evaluation demonstrates normal caliber and smooth contour of the esophagus. No evidence of fixed stricture, mass or mucosal abnormality. Mild nonspecific intermittent esophageal dysmotility. No hiatal hernia. No gastroesophageal reflux was observed. The patient swallowed a 13 mm barium tablet, which remained in the distal esophagus despite a prolonged period of observation. Normal appearance of the stomach, duodenal bulb and duodenal sweep. No evidence of ulceration, fold thickening or mass. IMPRESSION: A swallowed 13 mm barium tablet remained in the distal esophagus despite a prolonged period of observation. However, there is no appreciable distal esophageal stricture. Mild nonspecific esophageal dysmotility. Otherwise unremarkable upper GI series, as described. Electronically Signed   By: Kellie Simmering DO   On: 12/06/2019 13:54     ASSESSMENT/PLAN:  [name] is a 59 y.o. female with   1. 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 Her EUS biopsy with Dr Paulita Fujita on 09/25/19 shows pancreatic adenocarcinoma.  -Her 10/16/19 abdominal MRI showed multiple (>10) small liver lesions, most are consistent with metastatic lesions. Liver biopsy from 10/5/21confirmed metastatic pancreatic cancer. -Dr. Burr Medico started her on first-line or neoadjuvant chemo with FOLFIRINOX q2weeks on 10/24/19. Goal of therapy is to control or shrink disease. Due to poor tolerance treatment stopped after C2 11/21/19.Dr. Burr Medico switched her to second-line Gemcitabine 2 weeks on/1 week off starting 12/28/19. If she tolerates well, she will addAbraxane.  -Labs reviewed. Her AST and ALT are more elevated today. Her AST is 186. Her ALT is 219. Her total bilirubin is within normal limits at 0.9. I reviewed with Dr. Burr Medico and her symptoms with Dr. Burr Medico. We will not give her treatment today. She will receive IVF today, IV zofran, and pain medications. We will reschedule her treatment for 01/08/20. Will will continue to monitor her labs, Per Dr. Burr Medico, no imaging needed at this point.  -I had a lengthy discussion with the patient about her current condition today. Reviewed plan with Dr. Burr Medico. We will get a abdominal xray today to rule out obstruction. If no obstruction, the patient was advised to pick up magnesium citrate or milk of magnesia today. She was also given instructions on bowel prophylaxis. If no improvement in her symptoms with having a bowel movement, or if she develops new or worsening symptoms including uncontrolled pain, nausea/vomiting, fevers, chills, etc, then she was instructed to immediately go to the emergency  room.   2. Upper abdominal cramps, Weight loss, N/V/D -Secondary to #1and  chemo. -She has had intermittent upper abdominal cramping after certain meals. She also has had a gradual 40 pounds weight loss over 7 months (since 03/2019). -Her appetitefluctuates.Continue to f/u with dietician. Dr. Burr Medico previously recommend she use nutritional supplement 1-3 times daily.She does not like the taste of boost/ensure because it is too sweet. I encouraged her to water it down with milk, almond milk, or soy milk. If she does not like that, then she can make her own shakes/smoothies.  -Pt notesMirtazapinegave her jitters and stopped. Dr. Burr Medico previously gave her Marinol but has not tried it. She has held compazine as it has lead to blurred vision.  -Her 12/10/19 EGD showed Esophageal strictureor gastric/duodenal obstruction. She was given TPN upon 11/2019 hospital discharge. She is eating mildly by mouth. She states she typically eats grilled chicken and oatmeal daily.  -For pain she takes Oxycodone 2-3 tabs daily. -Discussed her oxycodone is prescribed for 4x daily if needed; however, that can worsen constipation. Therefore, bowel prophylaxis was discussed.  -Will receive IVF today and 4 mg zofran.  -Will order abdominal xray to rule out bowel obstruction.  -Had lengthy discussion regarding if she develops new or worsening symptoms to go to the ER over the weekend.   3. Comorbidities: Lupus, HTN, Migraines  -Symptomatic in her joints, Dx many years ago  -She is being followed by Rheumatologist Dr Trudie Reed and being treated with weekly Methotrexate injections, Plaquenil andDiclofenac. -For Migraines is well controlled on Topomax. She has been seen by Dr. Jaynee Eagles.   4. Leftsmall pneumothorax -Secondary to port placement. Repeat CXR 10/18/19 shows enlarging but still small persistent pneum. Will continue to monitoring   5. Anxiety, Social/FinancialSupport  -She has been very anxious with the fast pace workup and unexpected diagnosis. -She lives with her husband in [place].  She does not have children. She does have a brother who lives close by but no other near relatives.  6. Left Axillary Lymphadenopathy  -Her CT chest from9/20/21 showed multiple left axillary and subpectoral adenopathy. -Her left mammogram/US from 10/23/19 shows multiple left axillary LNs with cortical thickening up to 5-6 mm. Not palpable on exam. Radiology and Dr. Burr Medico feel this is likely reactive to covid19 vaccine and the recommendation is short interval follow up.  7. Anemia  -Secondary tosecondary to recent chemotherapy and underlying lupus -Last blood transfusion on 12/13/19.   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  -She is currently on Lovenox injections, will continue -She has moderate left ankle swelling  (01/04/20). She states she has not missed any lovenox injections recently since her last appointment. We will arrange for a US of the left lower extremity on Monday 01/08/20. She was instructed to continue taking her lovenox.   9. Hypokalemia  -secondary to recent diarrhea. On oral potassium once daily. She is fine to crush pill due to size.   PLAN: -Reviewed plan with Dr. Burr Medico -No treatment today. Will reschedule for 01/08/20 -IVF, anti-emetics, and pain medications today -Abdominal x-ray today to rule out bowel obstruction -Advised to take laxative upon returning home today. Bowel education given -Continue TPN,follow-up with dietitian.  -U/S left lower extremity on 01/08/20.  -If new or worsening symptoms such as failure to have a bowel movement, uncontrolled pain, fevers, chills, etc, the patient was advised to go to the emergency room.  -I will call the patient with the results  of her abdominal xray with further instructions   No orders of the defined types were placed in this encounter.    Markale Birdsell L Demaree Liberto, PA-C 01/04/20

## 2020-01-03 DIAGNOSIS — E440 Moderate protein-calorie malnutrition: Secondary | ICD-10-CM | POA: Diagnosis not present

## 2020-01-03 DIAGNOSIS — R112 Nausea with vomiting, unspecified: Secondary | ICD-10-CM | POA: Diagnosis not present

## 2020-01-03 DIAGNOSIS — R1310 Dysphagia, unspecified: Secondary | ICD-10-CM | POA: Diagnosis not present

## 2020-01-03 DIAGNOSIS — K529 Noninfective gastroenteritis and colitis, unspecified: Secondary | ICD-10-CM | POA: Diagnosis not present

## 2020-01-04 ENCOUNTER — Other Ambulatory Visit: Payer: Self-pay | Admitting: Emergency Medicine

## 2020-01-04 ENCOUNTER — Inpatient Hospital Stay: Payer: BC Managed Care – PPO

## 2020-01-04 ENCOUNTER — Telehealth: Payer: Self-pay

## 2020-01-04 ENCOUNTER — Ambulatory Visit (HOSPITAL_COMMUNITY)
Admission: RE | Admit: 2020-01-04 | Discharge: 2020-01-04 | Disposition: A | Discharge status: Home / Self Care | Payer: BC Managed Care – PPO | Source: Ambulatory Visit | Attending: Physician Assistant | Admitting: Physician Assistant

## 2020-01-04 ENCOUNTER — Other Ambulatory Visit: Payer: Self-pay

## 2020-01-04 ENCOUNTER — Other Ambulatory Visit: Payer: Self-pay | Admitting: Physician Assistant

## 2020-01-04 ENCOUNTER — Inpatient Hospital Stay (HOSPITAL_BASED_OUTPATIENT_CLINIC_OR_DEPARTMENT_OTHER): Payer: BC Managed Care – PPO | Admitting: Physician Assistant

## 2020-01-04 ENCOUNTER — Telehealth: Payer: Self-pay | Admitting: Physician Assistant

## 2020-01-04 VITALS — BP 126/80 | HR 116 | Temp 99.2°F | Resp 18

## 2020-01-04 DIAGNOSIS — R634 Abnormal weight loss: Secondary | ICD-10-CM | POA: Diagnosis not present

## 2020-01-04 DIAGNOSIS — C250 Malignant neoplasm of head of pancreas: Secondary | ICD-10-CM | POA: Diagnosis not present

## 2020-01-04 DIAGNOSIS — T451X5A Adverse effect of antineoplastic and immunosuppressive drugs, initial encounter: Secondary | ICD-10-CM

## 2020-01-04 DIAGNOSIS — R109 Unspecified abdominal pain: Secondary | ICD-10-CM | POA: Insufficient documentation

## 2020-01-04 DIAGNOSIS — E440 Moderate protein-calorie malnutrition: Secondary | ICD-10-CM | POA: Diagnosis not present

## 2020-01-04 DIAGNOSIS — D6481 Anemia due to antineoplastic chemotherapy: Secondary | ICD-10-CM | POA: Diagnosis not present

## 2020-01-04 DIAGNOSIS — I82422 Acute embolism and thrombosis of left iliac vein: Secondary | ICD-10-CM | POA: Diagnosis not present

## 2020-01-04 DIAGNOSIS — Z7901 Long term (current) use of anticoagulants: Secondary | ICD-10-CM | POA: Diagnosis not present

## 2020-01-04 DIAGNOSIS — R112 Nausea with vomiting, unspecified: Secondary | ICD-10-CM | POA: Diagnosis not present

## 2020-01-04 DIAGNOSIS — R1084 Generalized abdominal pain: Secondary | ICD-10-CM | POA: Diagnosis not present

## 2020-01-04 DIAGNOSIS — F419 Anxiety disorder, unspecified: Secondary | ICD-10-CM | POA: Diagnosis not present

## 2020-01-04 DIAGNOSIS — Z95828 Presence of other vascular implants and grafts: Secondary | ICD-10-CM

## 2020-01-04 DIAGNOSIS — K529 Noninfective gastroenteritis and colitis, unspecified: Secondary | ICD-10-CM | POA: Diagnosis not present

## 2020-01-04 DIAGNOSIS — R590 Localized enlarged lymph nodes: Secondary | ICD-10-CM | POA: Diagnosis not present

## 2020-01-04 DIAGNOSIS — M7989 Other specified soft tissue disorders: Secondary | ICD-10-CM

## 2020-01-04 DIAGNOSIS — C787 Secondary malignant neoplasm of liver and intrahepatic bile duct: Secondary | ICD-10-CM | POA: Diagnosis not present

## 2020-01-04 DIAGNOSIS — I10 Essential (primary) hypertension: Secondary | ICD-10-CM | POA: Diagnosis not present

## 2020-01-04 DIAGNOSIS — E876 Hypokalemia: Secondary | ICD-10-CM | POA: Diagnosis not present

## 2020-01-04 DIAGNOSIS — Z5111 Encounter for antineoplastic chemotherapy: Secondary | ICD-10-CM | POA: Diagnosis not present

## 2020-01-04 DIAGNOSIS — J939 Pneumothorax, unspecified: Secondary | ICD-10-CM | POA: Diagnosis not present

## 2020-01-04 DIAGNOSIS — G43909 Migraine, unspecified, not intractable, without status migrainosus: Secondary | ICD-10-CM | POA: Diagnosis not present

## 2020-01-04 DIAGNOSIS — M329 Systemic lupus erythematosus, unspecified: Secondary | ICD-10-CM | POA: Diagnosis not present

## 2020-01-04 LAB — CBC WITH DIFFERENTIAL (CANCER CENTER ONLY)
Abs Immature Granulocytes: 0.13 10*3/uL — ABNORMAL HIGH (ref 0.00–0.07)
Basophils Absolute: 0 10*3/uL (ref 0.0–0.1)
Basophils Relative: 0 %
Eosinophils Absolute: 0 10*3/uL (ref 0.0–0.5)
Eosinophils Relative: 0 %
HCT: 27.3 % — ABNORMAL LOW (ref 36.0–46.0)
Hemoglobin: 8.9 g/dL — ABNORMAL LOW (ref 12.0–15.0)
Immature Granulocytes: 2 %
Lymphocytes Relative: 17 %
Lymphs Abs: 1.3 10*3/uL (ref 0.7–4.0)
MCH: 29.6 pg (ref 26.0–34.0)
MCHC: 32.6 g/dL (ref 30.0–36.0)
MCV: 90.7 fL (ref 80.0–100.0)
Monocytes Absolute: 0.8 10*3/uL (ref 0.1–1.0)
Monocytes Relative: 10 %
Neutro Abs: 5.4 10*3/uL (ref 1.7–7.7)
Neutrophils Relative %: 71 %
Platelet Count: 134 10*3/uL — ABNORMAL LOW (ref 150–400)
RBC: 3.01 MIL/uL — ABNORMAL LOW (ref 3.87–5.11)
RDW: 15.2 % (ref 11.5–15.5)
WBC Count: 7.7 10*3/uL (ref 4.0–10.5)
nRBC: 0.3 % — ABNORMAL HIGH (ref 0.0–0.2)

## 2020-01-04 LAB — CMP (CANCER CENTER ONLY)
ALT: 219 U/L — ABNORMAL HIGH (ref 0–44)
AST: 186 U/L (ref 15–41)
Albumin: 2.6 g/dL — ABNORMAL LOW (ref 3.5–5.0)
Alkaline Phosphatase: 487 U/L — ABNORMAL HIGH (ref 38–126)
Anion gap: 11 (ref 5–15)
BUN: 19 mg/dL (ref 6–20)
CO2: 23 mmol/L (ref 22–32)
Calcium: 9.3 mg/dL (ref 8.9–10.3)
Chloride: 100 mmol/L (ref 98–111)
Creatinine: 0.73 mg/dL (ref 0.44–1.00)
GFR, Estimated: >60 mL/min (ref >60)
Glucose, Bld: 116 mg/dL — ABNORMAL HIGH (ref 70–99)
Potassium: 3.9 mmol/L (ref 3.5–5.1)
Sodium: 134 mmol/L — ABNORMAL LOW (ref 135–145)
Total Bilirubin: 0.9 mg/dL (ref 0.3–1.2)
Total Protein: 8.1 g/dL (ref 6.5–8.1)

## 2020-01-04 LAB — SAMPLE TO BLOOD BANK

## 2020-01-04 MED ORDER — ONDANSETRON HCL 4 MG/2ML IJ SOLN
INTRAMUSCULAR | Status: AC
  Filled 2020-01-04: qty 2

## 2020-01-04 MED ORDER — HEPARIN SOD (PORK) LOCK FLUSH 100 UNIT/ML IV SOLN
500.0000 [IU] | Freq: Once | INTRAVENOUS | Status: AC
  Administered 2020-01-04: 500 [IU]
  Filled 2020-01-04: qty 5

## 2020-01-04 MED ORDER — MORPHINE SULFATE (PF) 2 MG/ML IV SOLN
1.0000 mg | Freq: Once | INTRAVENOUS | Status: AC
  Administered 2020-01-04: 1 mg via INTRAVENOUS

## 2020-01-04 MED ORDER — MORPHINE SULFATE (PF) 2 MG/ML IV SOLN
INTRAVENOUS | Status: AC
  Filled 2020-01-04: qty 1

## 2020-01-04 MED ORDER — SODIUM CHLORIDE 0.9 % IV SOLN
Freq: Once | INTRAVENOUS | Status: AC
  Filled 2020-01-04: qty 250

## 2020-01-04 MED ORDER — SODIUM CHLORIDE 0.9% FLUSH
10.0000 mL | Freq: Once | INTRAVENOUS | Status: AC
  Administered 2020-01-04: 10 mL
  Filled 2020-01-04: qty 10

## 2020-01-04 MED ORDER — ONDANSETRON HCL 4 MG/2ML IJ SOLN
4.0000 mg | Freq: Once | INTRAMUSCULAR | Status: AC
  Administered 2020-01-04: 4 mg via INTRAVENOUS

## 2020-01-04 NOTE — Telephone Encounter (Signed)
I called the patient to let her know that her xray did not show any evidence of a bowel obstruction gas pattern. She just returned from the store having purchased medication for constipation. Reiterated again if new or worsening symptoms over the weekend to seek emergency room evaluation. She will return to the clinic for evaluation again on Monday 01/07/20. She expressed understanding and did not have any further questions.

## 2020-01-04 NOTE — Patient Instructions (Signed)
It is common for patients who are undergoing treatment and taking certain prescribed medications to experience side-effects with constipation.  If you experience constipation, please take stool softener such as Colace or Senna one tablet twice a day everyday to avoid constipation.  These medications are available over the counter.  Of course, if you have diarrhea, stop taking stool softeners.  Drinking plenty of fluid, eating fruits and vegetable, and being active also reduces the risk of constipation.   If despite taking stool softeners, and you still have no bowel movement for 2 days or more than your normal bowel habit frequency, please take one of the following over the counter laxatives:  MiraLax, Milk of Magnesia or Mag Citrate everyday. The goal is to have at least one bowel movement every other day.   

## 2020-01-04 NOTE — Telephone Encounter (Signed)
This patient is scheduled to see me today at 2:30. At about 2:06, I noticed that she had not arrived for her 1:45 lab appointment. I called her and asked her if she was coming to her appointment today. She told me that she called earlier today to talk about her abdominal spasms. I told her we want to see her today as soon as possible so she can make it to her 3:15 infusion appointment on time. She states she lives about 10 minutes away. I told her to come as soon as possible so that we have time to give her treatment today and evaluate her.

## 2020-01-04 NOTE — Telephone Encounter (Signed)
CRITICAL VALUE STICKER  CRITICAL VALUE: AST = [phone]  RECEIVER (on-site recipient of call): Yetta Glassman, [place] NOTIFIED: 01/04/20 at 3:55pm  MESSENGER (representative from lab): Ulice Dash  MD NOTIFIED: Dr. Burr Medico and Cassie, PA-C  TIME OF NOTIFICATION: 3:55pm  RESPONSE: Cassie, PA-C is following up with Dr. Burr Medico for pts next steps.

## 2020-01-05 DIAGNOSIS — E440 Moderate protein-calorie malnutrition: Secondary | ICD-10-CM | POA: Diagnosis not present

## 2020-01-05 DIAGNOSIS — K529 Noninfective gastroenteritis and colitis, unspecified: Secondary | ICD-10-CM | POA: Diagnosis not present

## 2020-01-05 DIAGNOSIS — R112 Nausea with vomiting, unspecified: Secondary | ICD-10-CM | POA: Diagnosis not present

## 2020-01-06 DIAGNOSIS — K529 Noninfective gastroenteritis and colitis, unspecified: Secondary | ICD-10-CM | POA: Diagnosis not present

## 2020-01-06 DIAGNOSIS — E440 Moderate protein-calorie malnutrition: Secondary | ICD-10-CM | POA: Diagnosis not present

## 2020-01-06 DIAGNOSIS — R112 Nausea with vomiting, unspecified: Secondary | ICD-10-CM | POA: Diagnosis not present

## 2020-01-07 ENCOUNTER — Inpatient Hospital Stay (HOSPITAL_BASED_OUTPATIENT_CLINIC_OR_DEPARTMENT_OTHER): Payer: BC Managed Care – PPO | Admitting: Nurse Practitioner

## 2020-01-07 ENCOUNTER — Other Ambulatory Visit: Payer: Self-pay

## 2020-01-07 ENCOUNTER — Inpatient Hospital Stay: Payer: BC Managed Care – PPO

## 2020-01-07 ENCOUNTER — Encounter: Payer: Self-pay | Admitting: Nurse Practitioner

## 2020-01-07 ENCOUNTER — Ambulatory Visit (HOSPITAL_COMMUNITY)
Admission: RE | Admit: 2020-01-07 | Discharge: 2020-01-07 | Disposition: A | Discharge status: Home / Self Care | Payer: BC Managed Care – PPO | Source: Ambulatory Visit | Attending: Physician Assistant | Admitting: Physician Assistant

## 2020-01-07 VITALS — BP 126/87 | HR 127 | Temp 99.0°F | Resp 17 | Ht 67.0 in | Wt 178.9 lb

## 2020-01-07 VITALS — BP 128/72 | HR 102 | Resp 18

## 2020-01-07 DIAGNOSIS — R634 Abnormal weight loss: Secondary | ICD-10-CM | POA: Diagnosis not present

## 2020-01-07 DIAGNOSIS — J939 Pneumothorax, unspecified: Secondary | ICD-10-CM | POA: Diagnosis not present

## 2020-01-07 DIAGNOSIS — C250 Malignant neoplasm of head of pancreas: Secondary | ICD-10-CM | POA: Diagnosis not present

## 2020-01-07 DIAGNOSIS — Z7189 Other specified counseling: Secondary | ICD-10-CM

## 2020-01-07 DIAGNOSIS — E876 Hypokalemia: Secondary | ICD-10-CM | POA: Diagnosis not present

## 2020-01-07 DIAGNOSIS — I10 Essential (primary) hypertension: Secondary | ICD-10-CM | POA: Diagnosis not present

## 2020-01-07 DIAGNOSIS — Z7901 Long term (current) use of anticoagulants: Secondary | ICD-10-CM | POA: Diagnosis not present

## 2020-01-07 DIAGNOSIS — D6481 Anemia due to antineoplastic chemotherapy: Secondary | ICD-10-CM | POA: Diagnosis not present

## 2020-01-07 DIAGNOSIS — Z5111 Encounter for antineoplastic chemotherapy: Secondary | ICD-10-CM | POA: Diagnosis not present

## 2020-01-07 DIAGNOSIS — K529 Noninfective gastroenteritis and colitis, unspecified: Secondary | ICD-10-CM | POA: Diagnosis not present

## 2020-01-07 DIAGNOSIS — G43909 Migraine, unspecified, not intractable, without status migrainosus: Secondary | ICD-10-CM | POA: Diagnosis not present

## 2020-01-07 DIAGNOSIS — M7989 Other specified soft tissue disorders: Secondary | ICD-10-CM | POA: Diagnosis not present

## 2020-01-07 DIAGNOSIS — I82422 Acute embolism and thrombosis of left iliac vein: Secondary | ICD-10-CM | POA: Diagnosis not present

## 2020-01-07 DIAGNOSIS — C787 Secondary malignant neoplasm of liver and intrahepatic bile duct: Secondary | ICD-10-CM | POA: Diagnosis not present

## 2020-01-07 DIAGNOSIS — Z95828 Presence of other vascular implants and grafts: Secondary | ICD-10-CM

## 2020-01-07 DIAGNOSIS — F419 Anxiety disorder, unspecified: Secondary | ICD-10-CM | POA: Diagnosis not present

## 2020-01-07 DIAGNOSIS — M329 Systemic lupus erythematosus, unspecified: Secondary | ICD-10-CM | POA: Diagnosis not present

## 2020-01-07 DIAGNOSIS — I82452 Acute embolism and thrombosis of left peroneal vein: Secondary | ICD-10-CM | POA: Diagnosis not present

## 2020-01-07 DIAGNOSIS — R112 Nausea with vomiting, unspecified: Secondary | ICD-10-CM | POA: Diagnosis not present

## 2020-01-07 DIAGNOSIS — R590 Localized enlarged lymph nodes: Secondary | ICD-10-CM | POA: Diagnosis not present

## 2020-01-07 DIAGNOSIS — E440 Moderate protein-calorie malnutrition: Secondary | ICD-10-CM | POA: Diagnosis not present

## 2020-01-07 LAB — CMP (CANCER CENTER ONLY)
ALT: 152 U/L — ABNORMAL HIGH (ref 0–44)
AST: 119 U/L — ABNORMAL HIGH (ref 15–41)
Albumin: 2.6 g/dL — ABNORMAL LOW (ref 3.5–5.0)
Alkaline Phosphatase: 435 U/L — ABNORMAL HIGH (ref 38–126)
Anion gap: 7 (ref 5–15)
BUN: 20 mg/dL (ref 6–20)
CO2: 27 mmol/L (ref 22–32)
Calcium: 9.1 mg/dL (ref 8.9–10.3)
Chloride: 101 mmol/L (ref 98–111)
Creatinine: 0.67 mg/dL (ref 0.44–1.00)
GFR, Estimated: >60 mL/min (ref >60)
Glucose, Bld: 105 mg/dL — ABNORMAL HIGH (ref 70–99)
Potassium: 4.1 mmol/L (ref 3.5–5.1)
Sodium: 135 mmol/L (ref 135–145)
Total Bilirubin: 1.1 mg/dL (ref 0.3–1.2)
Total Protein: 7.8 g/dL (ref 6.5–8.1)

## 2020-01-07 LAB — CBC WITH DIFFERENTIAL (CANCER CENTER ONLY)
Abs Immature Granulocytes: 0.08 10*3/uL — ABNORMAL HIGH (ref 0.00–0.07)
Basophils Absolute: 0 10*3/uL (ref 0.0–0.1)
Basophils Relative: 0 %
Eosinophils Absolute: 0.1 10*3/uL (ref 0.0–0.5)
Eosinophils Relative: 1 %
HCT: 24.1 % — ABNORMAL LOW (ref 36.0–46.0)
Hemoglobin: 7.9 g/dL — ABNORMAL LOW (ref 12.0–15.0)
Immature Granulocytes: 1 %
Lymphocytes Relative: 12 %
Lymphs Abs: 1 10*3/uL (ref 0.7–4.0)
MCH: 29.6 pg (ref 26.0–34.0)
MCHC: 32.8 g/dL (ref 30.0–36.0)
MCV: 90.3 fL (ref 80.0–100.0)
Monocytes Absolute: 1 10*3/uL (ref 0.1–1.0)
Monocytes Relative: 13 %
Neutro Abs: 5.6 10*3/uL (ref 1.7–7.7)
Neutrophils Relative %: 73 %
Platelet Count: 165 10*3/uL (ref 150–400)
RBC: 2.67 MIL/uL — ABNORMAL LOW (ref 3.87–5.11)
RDW: 15.9 % — ABNORMAL HIGH (ref 11.5–15.5)
WBC Count: 7.8 10*3/uL (ref 4.0–10.5)
nRBC: 0.3 % — ABNORMAL HIGH (ref 0.0–0.2)

## 2020-01-07 MED ORDER — PROCHLORPERAZINE MALEATE 10 MG PO TABS
10.0000 mg | ORAL_TABLET | Freq: Once | ORAL | Status: AC
  Administered 2020-01-07: 10 mg via ORAL

## 2020-01-07 MED ORDER — SODIUM CHLORIDE 0.9 % IV SOLN
600.0000 mg/m2 | Freq: Once | INTRAVENOUS | Status: AC
  Administered 2020-01-07: 1178 mg via INTRAVENOUS
  Filled 2020-01-07: qty 30.98

## 2020-01-07 MED ORDER — SODIUM CHLORIDE 0.9 % IV SOLN
Freq: Once | INTRAVENOUS | Status: AC
  Filled 2020-01-07: qty 250

## 2020-01-07 MED ORDER — SODIUM CHLORIDE 0.9% FLUSH
10.0000 mL | Freq: Once | INTRAVENOUS | Status: AC
  Administered 2020-01-07: 10 mL
  Filled 2020-01-07: qty 10

## 2020-01-07 MED ORDER — SODIUM CHLORIDE 0.9% FLUSH
10.0000 mL | INTRAVENOUS | Status: DC | PRN
  Filled 2020-01-07: qty 10

## 2020-01-07 MED ORDER — HEPARIN SOD (PORK) LOCK FLUSH 100 UNIT/ML IV SOLN
500.0000 [IU] | Freq: Once | INTRAVENOUS | Status: DC | PRN
  Filled 2020-01-07: qty 5

## 2020-01-07 MED ORDER — PROCHLORPERAZINE MALEATE 10 MG PO TABS
ORAL_TABLET | ORAL | Status: AC
  Filled 2020-01-07: qty 1

## 2020-01-07 NOTE — Patient Instructions (Signed)

## 2020-01-07 NOTE — Patient Instructions (Signed)
[facility] Discharge Instructions for Patients Receiving Chemotherapy  Today you received the following chemotherapy agents Gemcitabine (GEMZAR).  To help prevent nausea and vomiting after your treatment, we encourage you to take your nausea medication as prescribed.  If you develop nausea and vomiting that is not controlled by your nausea medication, call the clinic.   BELOW ARE SYMPTOMS THAT SHOULD BE REPORTED IMMEDIATELY:  *FEVER GREATER THAN 100.5 F  *CHILLS WITH OR WITHOUT FEVER  NAUSEA AND VOMITING THAT IS NOT CONTROLLED WITH YOUR NAUSEA MEDICATION  *UNUSUAL SHORTNESS OF BREATH  *UNUSUAL BRUISING OR BLEEDING  TENDERNESS IN MOUTH AND THROAT WITH OR WITHOUT PRESENCE OF ULCERS  *URINARY PROBLEMS  *BOWEL PROBLEMS  UNUSUAL RASH Items with * indicate a potential emergency and should be followed up as soon as possible.  Feel free to call the clinic should you have any questions or concerns. The clinic phone number is [phone].  Please show the [facility] at check-in to the Emergency Department and triage nurse.  Gemcitabine injection What is this medicine? GEMCITABINE (jem SYE ta been) is a chemotherapy drug. This medicine is used to treat many types of cancer like breast cancer, lung cancer, pancreatic cancer, and ovarian cancer. This medicine may be used for other purposes; ask your health care provider or pharmacist if you have questions. COMMON BRAND NAME(S): Gemzar, Infugem What should I tell my health care provider before I take this medicine? They need to know if you have any of these conditions:  blood disorders  infection  kidney disease  liver disease  lung or breathing disease, like asthma  recent or ongoing radiation therapy  an unusual or allergic reaction to gemcitabine, other chemotherapy, other medicines, foods, dyes, or preservatives  pregnant or trying to get pregnant  breast-feeding How should I use this  medicine? This drug is given as an infusion into a vein. It is administered in a hospital or clinic by a specially trained health care professional. Talk to your pediatrician regarding the use of this medicine in children. Special care may be needed. Overdosage: If you think you have taken too much of this medicine contact a poison control center or emergency room at once. NOTE: This medicine is only for you. Do not share this medicine with others. What if I miss a dose? It is important not to miss your dose. Call your doctor or health care professional if you are unable to keep an appointment. What may interact with this medicine?  medicines to increase blood counts like filgrastim, pegfilgrastim, sargramostim  some other chemotherapy drugs like cisplatin  vaccines Talk to your doctor or health care professional before taking any of these medicines:  acetaminophen  aspirin  ibuprofen  ketoprofen  naproxen This list may not describe all possible interactions. Give your health care provider a list of all the medicines, herbs, non-prescription drugs, or dietary supplements you use. Also tell them if you smoke, drink alcohol, or use illegal drugs. Some items may interact with your medicine. What should I watch for while using this medicine? Visit your doctor for checks on your progress. This drug may make you feel generally unwell. This is not uncommon, as chemotherapy can affect healthy cells as well as cancer cells. Report any side effects. Continue your course of treatment even though you feel ill unless your doctor tells you to stop. In some cases, you may be given additional medicines to help with side effects. Follow all directions for their use. Call  your doctor or health care professional for advice if you get a fever, chills or sore throat, or other symptoms of a cold or flu. Do not treat yourself. This drug decreases your body's ability to fight infections. Try to avoid being  around people who are sick. This medicine may increase your risk to bruise or bleed. Call your doctor or health care professional if you notice any unusual bleeding. Be careful brushing and flossing your teeth or using a toothpick because you may get an infection or bleed more easily. If you have any dental work done, tell your dentist you are receiving this medicine. Avoid taking products that contain aspirin, acetaminophen, ibuprofen, naproxen, or ketoprofen unless instructed by your doctor. These medicines may hide a fever. Do not become pregnant while taking this medicine or for 6 months after stopping it. Women should inform their doctor if they wish to become pregnant or think they might be pregnant. Men should not father a child while taking this medicine and for 3 months after stopping it. There is a potential for serious side effects to an unborn child. Talk to your health care professional or pharmacist for more information. Do not breast-feed an infant while taking this medicine or for at least 1 week after stopping it. Men should inform their doctors if they wish to father a child. This medicine may lower sperm counts. Talk with your doctor or health care professional if you are concerned about your fertility. What side effects may I notice from receiving this medicine? Side effects that you should report to your doctor or health care professional as soon as possible:  allergic reactions like skin rash, itching or hives, swelling of the face, lips, or tongue  breathing problems  pain, redness, or irritation at site where injected  signs and symptoms of a dangerous change in heartbeat or heart rhythm like chest pain; dizziness; fast or irregular heartbeat; palpitations; feeling faint or lightheaded, falls; breathing problems  signs of decreased platelets or bleeding - bruising, pinpoint red spots on the skin, black, tarry stools, blood in the urine  signs of decreased red blood cells -  unusually weak or tired, feeling faint or lightheaded, falls  signs of infection - fever or chills, cough, sore throat, pain or difficulty passing urine  signs and symptoms of kidney injury like trouble passing urine or change in the amount of urine  signs and symptoms of liver injury like dark yellow or brown urine; general ill feeling or flu-like symptoms; light-colored stools; loss of appetite; nausea; right upper belly pain; unusually weak or tired; yellowing of the eyes or skin  swelling of ankles, feet, hands Side effects that usually do not require medical attention (report to your doctor or health care professional if they continue or are bothersome):  constipation  diarrhea  hair loss  loss of appetite  nausea  rash  vomiting This list may not describe all possible side effects. Call your doctor for medical advice about side effects. You may report side effects to FDA at 1-800-FDA-1088. Where should I keep my medicine? This drug is given in a hospital or clinic and will not be stored at home. NOTE: This sheet is a summary. It may not cover all possible information. If you have questions about this medicine, talk to your doctor, pharmacist, or health care provider.  2020 [employer] Standard (2017-04-06 18:06:11)

## 2020-01-07 NOTE — Progress Notes (Signed)
Pt HGB is 7.9, ALT is 119, AST is 152 and HR is 118, ok to treat [name], NP. See new orders placed.

## 2020-01-07 NOTE — Progress Notes (Signed)
Pt discharged in no apparent distress. Pt left by wheelchair as she arrived.  Pt aware of discharge instructions and verbalized understanding and had no further questions.

## 2020-01-07 NOTE — Progress Notes (Signed)
Left lower extremity venous duplex has been completed. Preliminary results can be found in CV Proc through chart review.  Results were given to Carson PA.  01/07/20 12:54 PM Carlos Levering RVT

## 2020-01-07 NOTE — Progress Notes (Addendum)
[place]   [phone] Fax:([phone]) [phone]   Clinic Follow up Note   Patient Care Team: Josetta Huddle, MD as PCP - General (Internal Medicine) Jonnie Finner, RN as Oncology Nurse Navigator Truitt Merle, MD as Consulting Physician (Oncology) Arta Silence, MD as Consulting Physician (Gastroenterology) [name], MD as Consulting Physician (General Surgery) 01/07/2020  CHIEF COMPLAINT: Follow up pancreas cancer   SUMMARY OF ONCOLOGIC HISTORY: Oncology History Overview Note  Cancer Staging Pancreatic cancer [facility]) Staging form: Exocrine Pancreas, AJCC 8th Edition - Clinical stage from 10/18/2019: Stage IV (cT3, cN1, cM1) - Signed by Truitt Merle, MD on 10/18/2019    Pancreatic cancer ([place])  09/19/2019 Imaging   CT AP w contrast 09/19/19  IMPRESSION: 1. Findings are highly concerning for probable primary pancreatic adenocarcinoma in the anterior aspect of the pancreatic head. Several prominent borderline enlarged lymph nodes are noted in the hepatoduodenal nodal station, and there are multiple indeterminate liver lesions which are highly concerning for probable hepatic metastases. Further evaluation with nonemergent abdominal MRI with and without IV gadolinium with MRCP is recommended in the near future to better evaluate these findings.   09/25/2019 Procedure   Upper EUS by Dr Paulita Fujita  IMPRESSION -There was no evidence of significant pathology in the left lobe of the liver.  -A few lymph nodes were visualized and measures in the peripancreatic region and porta hepata region.  -Hyperchoic material consistent with sludge was visualized endosonographically in the gallbladder.  -There was no sign of significant pathology in the common bile duct.  -A mass was identified in the pancreatic head. If biopsy results show adenocarcinoma, it would be staged T3N1Mx by endosonographic criteria. Fine needle aspiration performed.    09/25/2019 Initial Biopsy    FINAL MICROSCOPIC DIAGNOSIS: Fine needle aspirate, Pancreas;  MALIGNANT CELLS PRESENT CONSISTENT WITH ADENOCARCINOMA.    10/02/2019 Initial Diagnosis   Pancreatic cancer ([facility])   10/11/2019 Procedure   PAC placement y Dr Barry Dienes    10/15/2019 Imaging   CT Chest  IMPRESSION: 1. Small anterior left pneumothorax with dependent atelectasis in the left lower lobe. 2. Increased number of bilateral axillary and subpectoral lymph nodes with mild lymphadenopathy in the left axilla. While this would be an atypical presentation for metastatic pancreatic cancer, this possibility is not excluded 3. Main duct dilatation in the pancreas, better assessed on abdomen CT 09/19/2019.   10/16/2019 Imaging   MRI Abdomen  IMPRESSION: 1. Substantially motion degraded scan. 2. Probable persistent small anterior left lung base pneumothorax, better seen on chest CT from 1 day prior. 3. Poorly marginated hypoenhancing 3.7 x 2.9 cm pancreatic head mass, which appears to invade the anterior peripancreatic fat, compatible with known pancreatic adenocarcinoma. Diffuse irregular dilatation of the main pancreatic duct in the pancreatic body and tail. Mild narrowing of the main portal vein by the mass. Abdominal vasculature remains patent and otherwise uninvolved. 4. Numerous (greater than 10) small liver masses scattered throughout the liver, largest 1.0 cm, which appear to demonstrate targetoid enhancement on the limited motion degraded postcontrast sequences, compatible with liver metastases. 5. Mild porta hepatis adenopathy, suspicious for metastatic disease.   10/18/2019 Cancer Staging   Staging form: Exocrine Pancreas, AJCC 8th Edition - Clinical stage from 10/18/2019: Stage IV (cT3, cN1, cM1) - Signed by Truitt Merle, MD on 10/18/2019   10/24/2019 - 11/21/2019 Chemotherapy   First-line FOLFIRINOX q2weeks starting 10/24/19. C2 postponed due to N/V/D and dose reduced 20-30%. Given poor tolerance, stopped after 2  cycles.  10/30/2019 Pathology Results   FINAL MICROSCOPIC DIAGNOSIS:   A. LIVER, LESION, BIOPSY:  -  Metastatic carcinoma  -  See comment   COMMENT:   By immunohistochemistry, the neoplastic cells are positive for  cytokeratin 7 and GATA3 with patchy nonspecific staining for PAX 8 but  negative for TTF-1, CDX2 and cytokeratin 20.  Overall, the findings are  consistent with metastasis of the patient's known breast carcinoma.  Prognostic panel (ER, PR, Her-2) is pending and will be reported in an  addendum.    ADDENDUM:   Dr. Laurence Ferrari notified us (November 01, 2019) that the patient was also  being worked up for a pancreatic mass.  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  In addition, ER and PR  are negative.  Gata-3 can be expressed in the pancreatic  adenocarcinomas; therefore, pancreatobiliary primary remains in the  differential.    11/02/2019 Genetic Testing   Negative genetic testing: no pathogenic variants detected in [employer] Common Hereditary Cancers Panel.  The report date is November 02, 2019.   The Common Hereditary Cancers Panel offered by [employer] includes sequencing and/or deletion duplication testing of the following 48 genes: APC, ATM, AXIN2, BARD1, BMPR1A, BRCA1, BRCA2, BRIP1, CDH1, CDK4, CDKN2A (p14ARF), CDKN2A (p16INK4a), CHEK2, CTNNA1, DICER1, EPCAM (Deletion/duplication testing only), GREM1 (promoter region deletion/duplication testing only), KIT, MEN1, MLH1, MSH2, MSH3, MSH6, MUTYH, NBN, NF1, NHTL1, PALB2, PDGFRA, PMS2, POLD1, POLE, PTEN, RAD50, RAD51C, RAD51D, RNF43, SDHB, SDHC, SDHD, SMAD4, SMARCA4. STK11, TP53, TSC1, TSC2, and VHL.  The following genes were evaluated for sequence changes only: SDHA and HOXB13 c.251G>A variant only.   11/04/2019 Imaging   CT AP  IMPRESSION: 1. Circumferential bowel wall thickening with adjacent fat stranding throughout the colon, most predominant in the transverse colon. This is  consistent with colitis, which may be infectious or inflammatory in etiology. 2. Ill-defined pancreatic head mass consistent with known malignancy, similar to mildly increased in comparison to prior CT. There are innumerable hypodense masses throughout the liver, increased in conspicuity in comparison to prior CT. Findings are worrisome for worsening metastatic disease. 3. Filling defect in the LEFT internal iliac vein, likely a thrombus with differential considerations including mixing artifact.   11/24/2019 Imaging   CT AP  IMPRESSION: Pancreatic head mass again noted compatible with patient's given history of pancreatic cancer.   Numerous metastases throughout the liver, enlarging since prior study.     12/10/2019 Procedure   Upper endoscopy by Dr Michail Sermon  IMPRESSION - Z-line regular, 42 cm from the incisors. - Esophageal ulcer with stigmata of recent bleeding. - Gastritis. Biopsied. - Mucosal changes in the duodenum. Biopsied. - Normal second portion of the duodenum. - Gastric and duodenal thickening concerning for inflammation from mets or due to mets from known pancreatic cancer.   FINAL MICROSCOPIC DIAGNOSIS:   A. DUODENUM, BIOPSY:  - Benign duodenal mucosa.  - No dysplasia or malignancy.   B. STOMACH, BIOPSY:  - Antral mucosa with mild chronic active inflammation.  - No intestinal metaplasia, dysplasia or carcinoma.   12/28/2019 -  Chemotherapy   Change her to second-line Gemcitabine 2 weeks on/1 week off starting 12/28/19. May add Cisplatin if tolerable.      CURRENT THERAPY: Change to second-line Gemcitabine 2 weeks on/1 week off starting 12/28/19  INTERVAL HISTORY: Ms. Shannon returns for follow up and potential chemotherapy as scheduled. She was seen 12/10 for C1D8 gemcitabine which was postponed due to transaminitis, abdominal cramping and constipation.  He  returns today in a wheelchair.  She feels weak and tired, resting much of the day. Doesn't sleep well  at night, wakes up every couple hours. She cycles TPN for 18 hours starting at 7 PM.  She vomited last night when she tried to eat during TPN and again after taking milk of magnesia.  She is only taking Zofran q8 hours.  N/V is better on gemcitabine than on previous chemo. She eventually had a couple loose bowel movements yesterday.  Abdominal cramping and discomfort improved slightly after BM.  P.o. intake varies, she is drinking more water.  She has a slight cough when she stops TPN, occasionally vomits if she coughs too much. She injects lovenox 120 mg daily into abdomen but not consistently same time each, anywhere from 8 am to 12 noon.   All other systems were reviewed with the patient and are negative.  MEDICAL HISTORY:  Past Medical History:  Diagnosis Date  . Allergies    peanuts, corn, beans, red grapefruit, naval oranges  . Asthma    allergy shots and medication  . Cancer [facility])    pancreatic  . Collagen vascular disease ([place])   . DDD (degenerative disc disease), lumbar   . Eustachian tube dysfunction 12/2012   rhinitis, vertigo- Dr. Wilburn Cornelia, ENT   . Fibroids   . Heart murmur    Echo 1/18: EF 55-60, no RWMA, normal diastolic function, trivial AI, PASP 32  . History of cardiac catheterization    LHC 3/18: normal coronary arteries.   . History of nuclear stress test    ETT-Myoview 2/18: EF 62, + ECG response; apical and distal septal ischemia; intermediate risk.  Marland Kitchen Hypertension   . Iron deficiency anemia   . Lupus [facility]) 2011   Dr. Trudie Reed  . Migraine headache    trial of generic maxalt 10 mg, January 2021  . Obesity   . Seasonal allergies   . Seizure in childhood [facility])    as a child no treatment none x 30 years    SURGICAL HISTORY: Past Surgical History:  Procedure Laterality Date  . ABDOMINAL HYSTERECTOMY    . BACK SURGERY  2009  . BIOPSY  12/10/2019   Procedure: BIOPSY;  Surgeon: Wilford Corner, MD;  Location: [facility] ENDOSCOPY;  Service: Endoscopy;;  . COLONOSCOPY  WITH PROPOFOL N/A 04/11/2012   Procedure: COLONOSCOPY WITH PROPOFOL;  Surgeon: Garlan Fair, MD;  Location: [facility] ENDOSCOPY;  Service: Endoscopy;  Laterality: N/A;  . ESOPHAGOGASTRODUODENOSCOPY (EGD) WITH PROPOFOL N/A 12/10/2019   Procedure: ESOPHAGOGASTRODUODENOSCOPY (EGD) WITH PROPOFOL;  Surgeon: Wilford Corner, MD;  Location: [facility] ENDOSCOPY;  Service: Endoscopy;  Laterality: N/A;  . HERNIA REPAIR     as child unbilical  . L8-V partial discectomy and laminectomy     Dr. Christella Noa  . LEFT HEART CATH AND CORONARY ANGIOGRAPHY N/A 04/09/2016   Procedure: Left Heart Cath and Coronary Angiography;  Surgeon: Nelva Bush, MD;  Location: [place] CV LAB;  Service: Cardiovascular;  Laterality: N/A;  . MOUTH SURGERY    . PORTACATH PLACEMENT N/A 10/11/2019   Procedure: INSERTION PORT-A-CATH LEFT SUBCLAVIAN;  Surgeon: [name], MD;  Location: [place];  Service: General;  Laterality: N/A;    I have reviewed the social history and family history with the patient and they are unchanged from previous note.  ALLERGIES:  is allergic to cinnamon, peanut-containing drug products, prednisone, corn oil, corn-containing products, and other.  MEDICATIONS:  Current Outpatient Medications  Medication Sig Dispense Refill  . acetaminophen (  TYLENOL) 500 MG tablet Take 1,000 mg by mouth every 6 (six) hours as needed for moderate pain.    Marland Kitchen albuterol (PROVENTIL HFA;VENTOLIN HFA) 108 (90 Base) MCG/ACT inhaler Inhale 2 puffs into the lungs every 6 (six) hours as needed for wheezing or shortness of breath. 18 g 0  . Bepotastine Besilate (BEPREVE) 1.5 % SOLN Place 1 drop into both eyes 2 (two) times daily as needed (dry eyes).     . Cholecalciferol (VITAMIN D) 2000 units CAPS Take 2,000 Units by mouth daily.     Marland Kitchen dexamethasone (DECADRON) 4 MG tablet Take 1 tablet (4 mg total) by mouth daily. Take 1 tablet once daily for 3 to 5 days after chemo 20 tablet 0  . diclofenac (VOLTAREN) 75 MG EC tablet  Take 1 tablet (75 mg total) by mouth 2 (two) times daily as needed.    . Dietary Management Product (RHEUMATE) CAPS Take 1 capsule by mouth daily.     . diphenoxylate-atropine (LOMOTIL) 2.5-0.025 MG tablet Take 2 tablets by mouth 4 (four) times daily as needed for diarrhea or loose stools (use 2nd, if Imodium doesn't work. Max dose: 8 tablets/day.). (Patient taking differently: Take 2 tablets by mouth 4 (four) times daily as needed for diarrhea or loose stools. ) 20 tablet 0  . DIPROLENE 0.05 % ointment Apply 1 application topically daily as needed (skin irritation).     Marland Kitchen dronabinol (MARINOL) 2.5 MG capsule Take 1 capsule (2.5 mg total) by mouth 2 (two) times daily before a meal. (Patient taking differently: Take 2.5 mg by mouth 2 (two) times daily as needed (nausea). ) 30 capsule 0  . enoxaparin (LOVENOX) 120 MG/0.8ML injection Inject 0.8 mLs (120 mg total) into the skin daily. 0.8 mL 30  . feeding supplement (ENSURE ENLIVE / ENSURE PLUS) LIQD Take 237 mLs by mouth 2 (two) times daily between meals. 237 mL 12  . ferrous sulfate 325 (65 FE) MG tablet Take 325 mg by mouth daily with breakfast.    . fluticasone (FLONASE) 50 MCG/ACT nasal spray Place 2 sprays into both nostrils daily. 16 g 12  . hydroxychloroquine (PLAQUENIL) 200 MG tablet Take 400 mg by mouth daily.   5  . lipase/protease/amylase 24000-76000 units CPEP Take 1 capsule (24,000 Units total) by mouth 3 (three) times daily with meals. 270 capsule 0  . loperamide (IMODIUM) 2 MG capsule Take 2 capsules (4 mg total) by mouth 2 (two) times daily as needed for diarrhea or loose stools (Use 1st). 30 capsule 0  . loratadine (CLARITIN) 10 MG tablet Take 10 mg by mouth daily.    Marland Kitchen LORazepam (ATIVAN) 0.5 MG tablet Take 1 tablet (0.5 mg total) by mouth every 8 (eight) hours as needed for anxiety (and anticiaptory n/v. DO NOT TAKE WITH XANAX). 30 tablet 0  . metoprolol succinate (TOPROL-XL) 50 MG 24 hr tablet Take 50 mg by mouth daily.     Marland Kitchen OLANZapine  (ZYPREXA) 7.5 MG tablet Take 1 tablet (7.5 mg total) by mouth at bedtime. 30 tablet 0  . ondansetron (ZOFRAN) 8 MG tablet Take 1 tablet (8 mg total) by mouth every 8 (eight) hours as needed for nausea or vomiting. 30 tablet 3  . oxyCODONE (OXY IR/ROXICODONE) 5 MG immediate release tablet Take 1 tablet (5 mg total) by mouth every 6 (six) hours as needed for severe pain. 60 tablet 0  . pantoprazole (PROTONIX) 40 MG tablet Take 40 mg by mouth daily.    . polyethylene glycol (MIRALAX /  GLYCOLAX) 17 g packet Take 17 g by mouth daily. 30 each 1  . potassium chloride 20 MEQ/15ML (10%) SOLN Take 15 mLs (20 mEq total) by mouth 2 (two) times daily. 750 mL 1  . senna-docusate (SENOKOT-S) 8.6-50 MG tablet Take 1 tablet by mouth 2 (two) times daily. 60 tablet 1  . sucralfate (CARAFATE) 1 GM/10ML suspension Take 10 mLs (1 g total) by mouth 4 (four) times daily -  with meals and at bedtime. 420 mL 3  . topiramate (TOPAMAX) 50 MG tablet Take 1 tablet (50 mg total) by mouth at bedtime.     Current Facility-Administered Medications  Medication Dose Route Frequency Provider Last Rate Last Admin  . 0.9 %  sodium chloride infusion   Intravenous Once Alla Feeling, NP 250 mL/hr at 01/07/20 1549 New Bag at 01/07/20 1549   Facility-Administered Medications Ordered in Other Visits  Medication Dose Route Frequency Provider Last Rate Last Admin  . gemcitabine (GEMZAR) 1,178 mg in sodium chloride 0.9 % 250 mL chemo infusion  600 mg/m2 (Treatment Plan Recorded) Intravenous Once Truitt Merle, MD      . heparin lock flush 100 unit/mL  500 Units Intracatheter Once PRN Truitt Merle, MD      . sodium chloride flush (NS) 0.9 % injection 10 mL  10 mL Intracatheter PRN Truitt Merle, MD        PHYSICAL EXAMINATION: ECOG PERFORMANCE STATUS: 2-3  Vitals:   01/07/20 1409  BP: 126/87  Pulse: (!) 127  Resp: 17  Temp: 99 F (37.2 C)  SpO2: 100%   Filed Weights   01/07/20 1409  Weight: 178 lb 14.4 oz (81.1 kg)    GENERAL:alert,  no distress and comfortable SKIN: no rash  EYES: sclera clear OROPHARYNX: discolored tongue, no thrush or ulcers NECK: without mass LUNGS: clear with normal breathing effort HEART: tachycardic, regular rhythm. Trace ankle edema bilaterally ABDOMEN:abdomen soft, non-tender and normal bowel sounds NEURO: alert & oriented x 3 with fluent speech, no focal motor/sensory deficits PAC without erythema   LABORATORY DATA:  I have reviewed the data as listed CBC Latest Ref Rng & Units 01/07/2020 01/04/2020 12/28/2019  WBC 4.0 - 10.5 K/uL 7.8 7.7 8.2  Hemoglobin 12.0 - 15.0 g/dL 7.9(L) 8.9(L) 9.3(L)  Hematocrit 36.0 - 46.0 % 24.1(L) 27.3(L) 28.8(L)  Platelets 150 - 400 K/uL 165 134(L) 245     CMP Latest Ref Rng & Units 01/07/2020 01/04/2020 12/28/2019  Glucose 70 - 99 mg/dL 105(H) 116(H) 106(H)  BUN 6 - 20 mg/dL 20 19 23(H)  Creatinine 0.44 - 1.00 mg/dL 0.67 0.73 0.66  Sodium 135 - 145 mmol/L 135 134(L) 137  Potassium 3.5 - 5.1 mmol/L 4.1 3.9 4.3  Chloride 98 - 111 mmol/L 101 100 102  CO2 22 - 32 mmol/L _0 Calcium 8.9 - 10.3 mg/dL 9.1 9.3 9.4  Total Protein 6.5 - 8.1 g/dL 7.8 8.1 8.2(H)  Total Bilirubin 0.3 - 1.2 mg/dL 1.1 0.9 0.6  Alkaline Phos 38 - 126 U/L 435(H) 487(H) 452(H)  AST 15 - 41 U/L 119(H) 186(HH) 112(H)  ALT 0 - 44 U/L 152(H) 219(H) 123(H)      RADIOGRAPHIC STUDIES: I have personally reviewed the radiological images as listed and agreed with the findings in the report. VAS [place] LOWER EXTREMITY VENOUS (DVT)  Result Date: 01/07/2020  Lower Venous DVT Study Indications: Swelling.  Risk Factors: Cancer. Limitations: Poor ultrasound/tissue interface. Comparison Study: No prior studies. Performing Technologist: Oliver Hum RVT  Examination Guidelines:  A complete evaluation includes B-mode imaging, spectral Doppler, color Doppler, and power Doppler as needed of all accessible portions of each vessel. Bilateral testing is considered an integral part of a complete  examination. Limited examinations for reoccurring indications may be performed as noted. The reflux portion of the exam is performed with the patient in reverse Trendelenburg.  +-----+---------------+---------+-----------+----------+--------------+ RIGHTCompressibilityPhasicitySpontaneityPropertiesThrombus Aging +-----+---------------+---------+-----------+----------+--------------+ CFV  Full           Yes      Yes                                 +-----+---------------+---------+-----------+----------+--------------+   +---------+---------------+---------+-----------+----------+--------------+ LEFT     CompressibilityPhasicitySpontaneityPropertiesThrombus Aging +---------+---------------+---------+-----------+----------+--------------+ CFV      Full           Yes      Yes                                 +---------+---------------+---------+-----------+----------+--------------+ SFJ      Full                                                        +---------+---------------+---------+-----------+----------+--------------+ FV Prox  Full                                                        +---------+---------------+---------+-----------+----------+--------------+ FV Mid   Full                                                        +---------+---------------+---------+-----------+----------+--------------+ FV DistalFull                                                        +---------+---------------+---------+-----------+----------+--------------+ PFV      Full                                                        +---------+---------------+---------+-----------+----------+--------------+ POP      Full           Yes      Yes                                 +---------+---------------+---------+-----------+----------+--------------+ PTV      Full                                                         +---------+---------------+---------+-----------+----------+--------------+  PERO     Partial                                      Acute          +---------+---------------+---------+-----------+----------+--------------+ Gastroc  None                                         Acute          +---------+---------------+---------+-----------+----------+--------------+     Summary: RIGHT: - No evidence of common femoral vein obstruction.  LEFT: - Findings consistent with acute deep vein thrombosis involving the left peroneal veins, and left gastrocnemius veins. - No cystic structure found in the popliteal fossa.  *See table(s) above for measurements and observations.    Preliminary      ASSESSMENT & PLAN: Cynthia Frye a 59 y.o.femalewith    1. 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 Her EUS biopsy with Dr Paulita Fujita on 09/25/19 shows pancreatic adenocarcinoma. -liver biopsy 10/30/19 confirmed metastatic pancreatic cancer.[facility]One was requested. -she began first line or neoadjuvant FOLFIRINOX on 10/24/19, she tolerated poorly, required hospital admission and supportive care. This was stopped after 2 cycles.  -she began second line gemcitabine 2-3 weeks on/1 week off on 12/28/19. -C1D8 postponed due to transaminitis, abdominal pain and constipation.   2.Nausea/vomiting, abdominal cramps -secondary to underlying malignancy and chemotherapy  -worse after C1 FOLFIRINOX, required ED visit for IV fluids, potassium, antiemetics and treated for UTI -Admitted 10/10-10/15 for N/V/D, diagnosed with colitis and found to have left iliac vein DVT, discharged on 10/15 -persistent n/v, improved after switching to single agent gemcitabine  3. Left iliac vein DVT (10/2019), new acute DVT left peroneal and gastrocnemius veins 01/07/2020  -Tolerating  Lovenox 120 mg once daily -she developed left leg edema in 12/2019, Doppler on 01/07/2020 confirmed acute DVT involving the left peroneal veins and last gastrocnemius veins which she developed on Lovenox -We discussed her risk factors.  The plan is to obtain anti-Xa level and potentially change to twice daily dosing if subtherapeutic  4. Comorbidities: Lupus, HTN, Migraines  -Symptomatic in her joints, Dx many years ago  -She is being followed by Rheumatologist Dr Wyline Copas and being treated with weekly Methotrexate injections, Plaquenil andDiclofenac. -For Migraines is well controlled on Topomax. She has been seen by Dr Jaynee Eagles.   5. Anxiety, insomnia, Social Support  -She has been very anxious since diagnosis. -She lives with her husband in [place]. She does not have children. She does have a brother who lives close by but no other near relatives. -She plans to apply for disability -She has been referred to social work -She tried mirtazapine for appetite, thought it made her jittery but she is unsure.  She is willing to retry to see if this helps her insomnia  Disposition:  Ms. Alguire appears stable. She is s/p C1D1 single agent gemcitabine. D8 was postponed for transaminitis, abdominal cramping, and constipation. Her LFTs improved today. Bowel obstruction was ruled out.  She continues to have nausea/vomiting, weakness and fatigue which is felt to be more related to her underlying malignancy rather than single agent dose-reduced to gemcitabine.  I recommend to add olanzapine.   CBC and CMP reviewed.  She is  being scheduled for blood transfusion this week for symptomatic anemia Hg 7.9.  She will proceed with cycle 1 day 8 gemcitabine today as planned plus IVF's.  We discussed increasing today's dose to 800 mg/m2.  She is apprehensive due to her difficulty tolerating chemo.  Given that we plan to add day 15 next week, we will keep gemcitabine at the current dose of 600 mg/m2 today.  We reviewed  her Doppler which confirms acute DVT in the left peroneal and gastrocnemius veins which she developed while on Lovenox.  We plan to obtain anti-Xa level this week and switch to 80 mg BID dosing if subtherapeutic.  We also discussed taking Lovenox at a consistent time each day.  Return this week for lab (LMWH level) and RBC. Follow up and C1D15 gemcitabine next week, then off 1 week. Patient seen with Dr. Burr Medico.   All questions were answered. The patient knows to call the clinic with any problems, questions or concerns. No barriers to learning were detected.     Alla Feeling, NP 01/07/20   Addendum  I have seen the patient, examined her. I agree with the assessment and and plan and have edited the notes.   Today's Doppler showed acute DVT in the left peroneal veins, and left gastrocnemius veins.  She has been compliant with dated Lovenox injection.  We will change it to 1 mg/kg twice daily, and the monitor antiten a level later this week (4-6 hrs after morning injection).   Lab reviewed, adequate for treatment, will proceed gemcitabine at 800 mg/m2.  She is more anemic, no overt bleeding, likely related to chemotherapy and her underlying malignancy.  Will arrange blood transfusion.   Lab, F/u and day 15 chemo next week.  Truitt Merle  01/07/2020

## 2020-01-08 ENCOUNTER — Telehealth: Payer: Self-pay | Admitting: Nurse Practitioner

## 2020-01-08 ENCOUNTER — Other Ambulatory Visit: Payer: Self-pay | Admitting: Nurse Practitioner

## 2020-01-08 DIAGNOSIS — K529 Noninfective gastroenteritis and colitis, unspecified: Secondary | ICD-10-CM | POA: Diagnosis not present

## 2020-01-08 DIAGNOSIS — E440 Moderate protein-calorie malnutrition: Secondary | ICD-10-CM | POA: Diagnosis not present

## 2020-01-08 DIAGNOSIS — C250 Malignant neoplasm of head of pancreas: Secondary | ICD-10-CM

## 2020-01-08 DIAGNOSIS — R112 Nausea with vomiting, unspecified: Secondary | ICD-10-CM | POA: Diagnosis not present

## 2020-01-08 MED ORDER — MIRTAZAPINE 7.5 MG PO TABS: 7.5000 mg | ORAL_TABLET | Freq: Every day | ORAL | 0 refills | Status: DC

## 2020-01-08 NOTE — Telephone Encounter (Signed)
Scheduled appts per 12/13 los. Pt confirmed appt date and time.  

## 2020-01-09 ENCOUNTER — Inpatient Hospital Stay: Payer: BC Managed Care – PPO

## 2020-01-09 ENCOUNTER — Other Ambulatory Visit: Payer: Self-pay | Admitting: Nurse Practitioner

## 2020-01-09 ENCOUNTER — Encounter: Payer: Self-pay | Admitting: Nurse Practitioner

## 2020-01-09 ENCOUNTER — Other Ambulatory Visit: Payer: Self-pay

## 2020-01-09 DIAGNOSIS — K529 Noninfective gastroenteritis and colitis, unspecified: Secondary | ICD-10-CM | POA: Diagnosis not present

## 2020-01-09 DIAGNOSIS — R590 Localized enlarged lymph nodes: Secondary | ICD-10-CM | POA: Diagnosis not present

## 2020-01-09 DIAGNOSIS — E876 Hypokalemia: Secondary | ICD-10-CM | POA: Diagnosis not present

## 2020-01-09 DIAGNOSIS — G43909 Migraine, unspecified, not intractable, without status migrainosus: Secondary | ICD-10-CM | POA: Diagnosis not present

## 2020-01-09 DIAGNOSIS — I82452 Acute embolism and thrombosis of left peroneal vein: Secondary | ICD-10-CM

## 2020-01-09 DIAGNOSIS — F419 Anxiety disorder, unspecified: Secondary | ICD-10-CM | POA: Diagnosis not present

## 2020-01-09 DIAGNOSIS — C250 Malignant neoplasm of head of pancreas: Secondary | ICD-10-CM

## 2020-01-09 DIAGNOSIS — Z95828 Presence of other vascular implants and grafts: Secondary | ICD-10-CM

## 2020-01-09 DIAGNOSIS — M329 Systemic lupus erythematosus, unspecified: Secondary | ICD-10-CM | POA: Diagnosis not present

## 2020-01-09 DIAGNOSIS — E440 Moderate protein-calorie malnutrition: Secondary | ICD-10-CM | POA: Diagnosis not present

## 2020-01-09 DIAGNOSIS — R634 Abnormal weight loss: Secondary | ICD-10-CM | POA: Diagnosis not present

## 2020-01-09 DIAGNOSIS — I10 Essential (primary) hypertension: Secondary | ICD-10-CM | POA: Diagnosis not present

## 2020-01-09 DIAGNOSIS — J939 Pneumothorax, unspecified: Secondary | ICD-10-CM | POA: Diagnosis not present

## 2020-01-09 DIAGNOSIS — Z7901 Long term (current) use of anticoagulants: Secondary | ICD-10-CM | POA: Diagnosis not present

## 2020-01-09 DIAGNOSIS — D6481 Anemia due to antineoplastic chemotherapy: Secondary | ICD-10-CM | POA: Diagnosis not present

## 2020-01-09 DIAGNOSIS — C787 Secondary malignant neoplasm of liver and intrahepatic bile duct: Secondary | ICD-10-CM | POA: Diagnosis not present

## 2020-01-09 DIAGNOSIS — I82422 Acute embolism and thrombosis of left iliac vein: Secondary | ICD-10-CM | POA: Diagnosis not present

## 2020-01-09 DIAGNOSIS — Z5111 Encounter for antineoplastic chemotherapy: Secondary | ICD-10-CM | POA: Diagnosis not present

## 2020-01-09 DIAGNOSIS — R112 Nausea with vomiting, unspecified: Secondary | ICD-10-CM | POA: Diagnosis not present

## 2020-01-09 LAB — CMP (CANCER CENTER ONLY)
ALT: 171 U/L — ABNORMAL HIGH (ref 0–44)
AST: 148 U/L — ABNORMAL HIGH (ref 15–41)
Albumin: 2.5 g/dL — ABNORMAL LOW (ref 3.5–5.0)
Alkaline Phosphatase: 396 U/L — ABNORMAL HIGH (ref 38–126)
Anion gap: 0 — ABNORMAL LOW (ref 5–15)
BUN: 23 mg/dL — ABNORMAL HIGH (ref 6–20)
CO2: 36 mmol/L — ABNORMAL HIGH (ref 22–32)
Calcium: 9.3 mg/dL (ref 8.9–10.3)
Chloride: 104 mmol/L (ref 98–111)
Creatinine: 0.68 mg/dL (ref 0.44–1.00)
GFR, Estimated: >60 mL/min (ref >60)
Glucose, Bld: 111 mg/dL — ABNORMAL HIGH (ref 70–99)
Potassium: 4.4 mmol/L (ref 3.5–5.1)
Sodium: 139 mmol/L (ref 135–145)
Total Bilirubin: 1.2 mg/dL (ref 0.3–1.2)
Total Protein: 8 g/dL (ref 6.5–8.1)

## 2020-01-09 LAB — CBC WITH DIFFERENTIAL (CANCER CENTER ONLY)
Abs Immature Granulocytes: 0.03 10*3/uL (ref 0.00–0.07)
Basophils Absolute: 0 10*3/uL (ref 0.0–0.1)
Basophils Relative: 0 %
Eosinophils Absolute: 0.1 10*3/uL (ref 0.0–0.5)
Eosinophils Relative: 1 %
HCT: 23 % — ABNORMAL LOW (ref 36.0–46.0)
Hemoglobin: 7.3 g/dL — ABNORMAL LOW (ref 12.0–15.0)
Immature Granulocytes: 0 %
Lymphocytes Relative: 11 %
Lymphs Abs: 1 10*3/uL (ref 0.7–4.0)
MCH: 29.1 pg (ref 26.0–34.0)
MCHC: 31.7 g/dL (ref 30.0–36.0)
MCV: 91.6 fL (ref 80.0–100.0)
Monocytes Absolute: 0.2 10*3/uL (ref 0.1–1.0)
Monocytes Relative: 2 %
Neutro Abs: 7.4 10*3/uL (ref 1.7–7.7)
Neutrophils Relative %: 86 %
Platelet Count: 210 10*3/uL (ref 150–400)
RBC: 2.51 MIL/uL — ABNORMAL LOW (ref 3.87–5.11)
RDW: 16 % — ABNORMAL HIGH (ref 11.5–15.5)
WBC Count: 8.7 10*3/uL (ref 4.0–10.5)
nRBC: 0 % (ref 0.0–0.2)

## 2020-01-09 LAB — PREPARE RBC (CROSSMATCH)

## 2020-01-09 LAB — HEPARIN ANTI-XA: Heparin LMW: 0.91 IU/mL

## 2020-01-09 LAB — D-DIMER, QUANTITATIVE: D-Dimer, Quant: 19.86 ug/mL-FEU — ABNORMAL HIGH (ref 0.00–0.50)

## 2020-01-09 MED ORDER — SODIUM CHLORIDE 0.9% IV SOLUTION
250.0000 mL | Freq: Once | INTRAVENOUS | Status: AC
  Administered 2020-01-09: 250 mL via INTRAVENOUS
  Filled 2020-01-09: qty 250

## 2020-01-09 MED ORDER — HEPARIN SOD (PORK) LOCK FLUSH 100 UNIT/ML IV SOLN
500.0000 [IU] | Freq: Every day | INTRAVENOUS | Status: DC | PRN
  Filled 2020-01-09: qty 5

## 2020-01-09 MED ORDER — ENOXAPARIN SODIUM 80 MG/0.8ML ~~LOC~~ SOLN: 80.0000 mg | Freq: Two times a day (BID) | SUBCUTANEOUS | 2 refills | Status: DC

## 2020-01-09 MED ORDER — SODIUM CHLORIDE 0.9% FLUSH
10.0000 mL | INTRAVENOUS | Status: AC | PRN
  Administered 2020-01-09: 10 mL
  Filled 2020-01-09: qty 10

## 2020-01-09 MED ORDER — SODIUM CHLORIDE 0.9% FLUSH
10.0000 mL | Freq: Once | INTRAVENOUS | Status: AC
  Administered 2020-01-09: 10 mL
  Filled 2020-01-09: qty 10

## 2020-01-09 NOTE — Progress Notes (Unsigned)
I spoke to patient while receiving RBC transfusion in treatment area. We reviewed anti-Xa level which is slightly subtherapeutic for her dosing, so she will change to lovenox 80 mg q12 starting 01/10/20. She continues to have cough, shortness of breath is new. She wonders if there is problem with air bubbles with disconnecting TPN. She has conversational dyspnea. I recommend CTA to r/o PE, will arrange outpatient. She understands to go to ED if she has progressive dyspnea, cough, chest pain.   She reports n/v/d since treatment this week, we discussed symptom management. Will arrange IVF on 12/18. She is aware to pick up mirtazapine and new lovenox inj at her pharmacy.  She understands instructions/plan and has no new questions.   Cira Rue, NP

## 2020-01-09 NOTE — Patient Instructions (Signed)

## 2020-01-10 ENCOUNTER — Telehealth: Payer: Self-pay

## 2020-01-10 DIAGNOSIS — E440 Moderate protein-calorie malnutrition: Secondary | ICD-10-CM | POA: Diagnosis not present

## 2020-01-10 DIAGNOSIS — K529 Noninfective gastroenteritis and colitis, unspecified: Secondary | ICD-10-CM | POA: Diagnosis not present

## 2020-01-10 DIAGNOSIS — R112 Nausea with vomiting, unspecified: Secondary | ICD-10-CM | POA: Diagnosis not present

## 2020-01-10 LAB — TYPE AND SCREEN
ABO/RH(D): B NEG
Antibody Screen: NEGATIVE
Unit division: 0

## 2020-01-10 LAB — BPAM RBC
Blood Product Expiration Date: 202112292359
ISSUE DATE / TIME: 202112151447
Unit Type and Rh: 1700

## 2020-01-10 LAB — CANCER ANTIGEN 19-9: CA 19-9: 373857 U/mL — ABNORMAL HIGH (ref 0–35)

## 2020-01-10 NOTE — Telephone Encounter (Signed)
Patient calls stating Cira Rue NP changed her dose of Lovenox however the pharmacy will not have in until later this afternoon.  She is wanting to know if she can use her current dose until able to get new dose.  I consulted with Cira Rue NP and advised the patient okay to do so until Lovenox 80 mg comes into the pharmacy.

## 2020-01-11 ENCOUNTER — Ambulatory Visit (HOSPITAL_COMMUNITY)
Admission: RE | Admit: 2020-01-11 | Discharge: 2020-01-11 | Disposition: A | Discharge status: Home / Self Care | Payer: BC Managed Care – PPO | Source: Ambulatory Visit | Attending: Nurse Practitioner | Admitting: Nurse Practitioner

## 2020-01-11 ENCOUNTER — Telehealth: Payer: Self-pay

## 2020-01-11 ENCOUNTER — Encounter (HOSPITAL_COMMUNITY): Payer: Self-pay

## 2020-01-11 ENCOUNTER — Other Ambulatory Visit: Payer: Self-pay

## 2020-01-11 DIAGNOSIS — K529 Noninfective gastroenteritis and colitis, unspecified: Secondary | ICD-10-CM | POA: Diagnosis not present

## 2020-01-11 DIAGNOSIS — R112 Nausea with vomiting, unspecified: Secondary | ICD-10-CM | POA: Diagnosis not present

## 2020-01-11 DIAGNOSIS — R0602 Shortness of breath: Secondary | ICD-10-CM | POA: Diagnosis not present

## 2020-01-11 DIAGNOSIS — I82452 Acute embolism and thrombosis of left peroneal vein: Secondary | ICD-10-CM | POA: Diagnosis not present

## 2020-01-11 DIAGNOSIS — I2699 Other pulmonary embolism without acute cor pulmonale: Secondary | ICD-10-CM | POA: Diagnosis not present

## 2020-01-11 DIAGNOSIS — E440 Moderate protein-calorie malnutrition: Secondary | ICD-10-CM | POA: Diagnosis not present

## 2020-01-11 DIAGNOSIS — R1310 Dysphagia, unspecified: Secondary | ICD-10-CM | POA: Diagnosis not present

## 2020-01-11 MED ORDER — SODIUM CHLORIDE (PF) 0.9 % IJ SOLN
INTRAMUSCULAR | Status: AC
  Filled 2020-01-11: qty 50

## 2020-01-11 MED ORDER — IOHEXOL 350 MG/ML SOLN
100.0000 mL | Freq: Once | INTRAVENOUS | Status: AC | PRN
  Administered 2020-01-11: 100 mL via INTRAVENOUS

## 2020-01-11 MED ORDER — HEPARIN SOD (PORK) LOCK FLUSH 100 UNIT/ML IV SOLN
500.0000 [IU] | Freq: Once | INTRAVENOUS | Status: AC
  Administered 2020-01-11: 500 [IU]

## 2020-01-11 MED ORDER — HEPARIN SOD (PORK) LOCK FLUSH 100 UNIT/ML IV SOLN
INTRAVENOUS | Status: AC
  Filled 2020-01-11: qty 5

## 2020-01-11 NOTE — Telephone Encounter (Signed)
Per Dr Burr Medico OK for Radiology to deaccess port a cath and access with power lok needle.  Please keep accessed for TPN at home

## 2020-01-12 ENCOUNTER — Telehealth: Payer: Self-pay | Admitting: Hematology

## 2020-01-12 ENCOUNTER — Inpatient Hospital Stay: Payer: BC Managed Care – PPO

## 2020-01-12 VITALS — BP 133/86 | HR 102 | Temp 98.3°F | Resp 18

## 2020-01-12 DIAGNOSIS — C250 Malignant neoplasm of head of pancreas: Secondary | ICD-10-CM | POA: Diagnosis not present

## 2020-01-12 DIAGNOSIS — R590 Localized enlarged lymph nodes: Secondary | ICD-10-CM | POA: Diagnosis not present

## 2020-01-12 DIAGNOSIS — Z95828 Presence of other vascular implants and grafts: Secondary | ICD-10-CM

## 2020-01-12 DIAGNOSIS — E440 Moderate protein-calorie malnutrition: Secondary | ICD-10-CM | POA: Diagnosis not present

## 2020-01-12 DIAGNOSIS — R634 Abnormal weight loss: Secondary | ICD-10-CM | POA: Diagnosis not present

## 2020-01-12 DIAGNOSIS — I10 Essential (primary) hypertension: Secondary | ICD-10-CM | POA: Diagnosis not present

## 2020-01-12 DIAGNOSIS — C787 Secondary malignant neoplasm of liver and intrahepatic bile duct: Secondary | ICD-10-CM | POA: Diagnosis not present

## 2020-01-12 DIAGNOSIS — J939 Pneumothorax, unspecified: Secondary | ICD-10-CM | POA: Diagnosis not present

## 2020-01-12 DIAGNOSIS — G43909 Migraine, unspecified, not intractable, without status migrainosus: Secondary | ICD-10-CM | POA: Diagnosis not present

## 2020-01-12 DIAGNOSIS — K529 Noninfective gastroenteritis and colitis, unspecified: Secondary | ICD-10-CM | POA: Diagnosis not present

## 2020-01-12 DIAGNOSIS — Z7901 Long term (current) use of anticoagulants: Secondary | ICD-10-CM | POA: Diagnosis not present

## 2020-01-12 DIAGNOSIS — Z5111 Encounter for antineoplastic chemotherapy: Secondary | ICD-10-CM | POA: Diagnosis not present

## 2020-01-12 DIAGNOSIS — R112 Nausea with vomiting, unspecified: Secondary | ICD-10-CM | POA: Diagnosis not present

## 2020-01-12 DIAGNOSIS — M329 Systemic lupus erythematosus, unspecified: Secondary | ICD-10-CM | POA: Diagnosis not present

## 2020-01-12 DIAGNOSIS — I82422 Acute embolism and thrombosis of left iliac vein: Secondary | ICD-10-CM | POA: Diagnosis not present

## 2020-01-12 DIAGNOSIS — F419 Anxiety disorder, unspecified: Secondary | ICD-10-CM | POA: Diagnosis not present

## 2020-01-12 DIAGNOSIS — E876 Hypokalemia: Secondary | ICD-10-CM | POA: Diagnosis not present

## 2020-01-12 DIAGNOSIS — D6481 Anemia due to antineoplastic chemotherapy: Secondary | ICD-10-CM | POA: Diagnosis not present

## 2020-01-12 MED ORDER — SODIUM CHLORIDE 0.9% FLUSH
10.0000 mL | Freq: Once | INTRAVENOUS | Status: AC
Start: 2020-01-12 — End: 2020-01-12
  Administered 2020-01-12: 10 mL
  Filled 2020-01-12: qty 10

## 2020-01-12 MED ORDER — PROMETHAZINE HCL 25 MG/ML IJ SOLN: 25.0000 mg | Freq: Once | INTRAMUSCULAR | Status: DC

## 2020-01-12 MED ORDER — ONDANSETRON HCL 4 MG/2ML IJ SOLN
INTRAMUSCULAR | Status: AC
  Filled 2020-01-12: qty 4

## 2020-01-12 MED ORDER — SODIUM CHLORIDE 0.9 % IV SOLN: Freq: Once | INTRAVENOUS | Status: AC

## 2020-01-12 MED ORDER — HEPARIN SOD (PORK) LOCK FLUSH 100 UNIT/ML IV SOLN
500.0000 [IU] | Freq: Once | INTRAVENOUS | Status: AC
  Administered 2020-01-12: 500 [IU]
  Filled 2020-01-12: qty 5

## 2020-01-12 MED ORDER — SODIUM CHLORIDE 0.9 % IV SOLN
Freq: Once | INTRAVENOUS | Status: AC
  Filled 2020-01-12: qty 250

## 2020-01-12 NOTE — Patient Instructions (Signed)
Nausea and Vomiting, Adult Nausea is the feeling that you have an upset stomach or that you are about to vomit. Vomiting is when stomach contents are thrown up and out of the mouth as a result of nausea. Vomiting can make you feel weak and cause you to become dehydrated. Dehydration can make you feel tired and thirsty, cause you to have a dry mouth, and decrease how often you urinate. Older adults and people with other diseases or a weak disease-fighting system (immune system) are at higher risk for dehydration. It is important to treat your nausea and vomiting as told by your health care provider. Follow these instructions at home: Watch your symptoms for any changes. Tell your health care provider about them. Follow these instructions to care for yourself at home. Eating and drinking      Take an oral rehydration solution (ORS). This is a drink that is sold at pharmacies and retail stores.  Drink clear fluids slowly and in small amounts as you are able. Clear fluids include water, ice chips, low-calorie sports drinks, and fruit juice that has water added (diluted fruit juice).  Eat bland, easy-to-digest foods in small amounts as you are able. These foods include bananas, applesauce, rice, lean meats, toast, and crackers.  Avoid fluids that contain a lot of sugar or caffeine, such as energy drinks, sports drinks, and soda.  Avoid alcohol.  Avoid spicy or fatty foods. General instructions  Take over-the-counter and prescription medicines only as told by your health care provider.  Drink enough fluid to keep your urine pale yellow.  Wash your hands often using soap and water. If soap and water are not available, use hand sanitizer.  Make sure that all people in your household wash their hands well and often.  Rest at home while you recover.  Watch your condition for any changes.  Breathe slowly and deeply when you feel nauseated.  Keep all follow-up visits as told by your health  care provider. This is important. Contact a health care provider if:  Your symptoms get worse.  You have new symptoms.  You have a fever.  You cannot drink fluids without vomiting.  Your nausea does not go away after 2 days.  You feel light-headed or dizzy.  You have a headache.  You have muscle cramps.  You have a rash.  You have pain while urinating. Get help right away if:  You have pain in your chest, neck, arm, or jaw.  You feel extremely weak or you faint.  You have persistent vomiting.  You have vomit that is bright red or looks like black coffee grounds.  You have bloody or black stools or stools that look like tar.  You have a severe headache, a stiff neck, or both.  You have severe pain, cramping, or bloating in your abdomen.  You have difficulty breathing, or you are breathing very quickly.  Your heart is beating very quickly.  Your skin feels cold and clammy.  You feel confused.  You have signs of dehydration, such as: ? Dark urine, very little urine, or no urine. ? Cracked lips. ? Dry mouth. ? Sunken eyes. ? Sleepiness. ? Weakness. These symptoms may represent a serious problem that is an emergency. Do not wait to see if the symptoms will go away. Get medical help right away. Call your local emergency services (911 in the [place] Do not drive yourself to the hospital. Summary  Nausea is the feeling that you have an upset stomach  or that you are about to vomit. As nausea gets worse, it can lead to vomiting. Vomiting can make you feel weak and cause you to become dehydrated.  Follow instructions from your health care provider about eating and drinking to prevent dehydration.  Take over-the-counter and prescription medicines only as told by your health care provider.  Contact your health care provider if your symptoms get worse, or you have new symptoms.  Keep all follow-up visits as told by your health care provider. This is important. This  information is not intended to replace advice given to you by your health care provider. Make sure you discuss any questions you have with your health care provider. Document Revised: 05/05/2018 Document Reviewed: 06/21/2017 Elsevier Patient Education  [employer].   Rehydration, Adult Rehydration is the replacement of body fluids and salts and minerals (electrolytes) that are lost during dehydration. Dehydration is when there is not enough fluid or water in the body. This happens when you lose more fluids than you take in. Common causes of dehydration include:  Vomiting.  Diarrhea.  Excessive sweating, such as from heat exposure or exercise.  Taking medicines that cause the body to lose excess fluid (diuretics).  Impaired kidney function.  Not drinking enough fluid.  Certain illnesses or infections.  Certain poorly controlled long-term (chronic) illnesses, such as diabetes, heart disease, and kidney disease.  Symptoms of mild dehydration may include thirst, dry lips and mouth, dry skin, and dizziness. Symptoms of severe dehydration may include increased heart rate, confusion, fainting, and not urinating. You can rehydrate by drinking certain fluids or getting fluids through an IV tube, as told by your health care provider. What are the risks? Generally, rehydration is safe. However, one problem that can happen is taking in too much fluid (overhydration). This is rare. If overhydration happens, it can cause an electrolyte imbalance, kidney failure, or a decrease in salt (sodium) levels in the body. How to rehydrate Follow instructions from your health care provider for rehydration. The kind of fluid you should drink and the amount you should drink depend on your condition.  If directed by your health care provider, drink an oral rehydration solution (ORS). This is a drink designed to treat dehydration that is found in pharmacies and retail stores. ? Make an ORS by following  instructions on the package. ? Start by drinking small amounts, about  cup (120 mL) every 5-10 minutes. ? Slowly increase how much you drink until you have taken the amount recommended by your health care provider.  Drink enough clear fluids to keep your urine clear or pale yellow. If you were instructed to drink an ORS, finish the ORS first, then start slowly drinking other clear fluids. Drink fluids such as: ? Water. Do not drink only water. Doing that can lead to having too little sodium in your body (hyponatremia). ? Ice chips. ? Fruit juice that you have added water to (diluted juice). ? Low-calorie sports drinks.  If you are severely dehydrated, your health care provider may recommend that you receive fluids through an IV tube in the hospital.  Do not take sodium tablets. Doing that can lead to the condition of having too much sodium in your body (hypernatremia). Eating while you rehydrate Follow instructions from your health care provider about what to eat while you rehydrate. Your health care provider may recommend that you slowly begin eating regular foods in small amounts.  Eat foods that contain a healthy balance of electrolytes, such as  bananas, oranges, potatoes, tomatoes, and spinach.  Avoid foods that are greasy or contain a lot of fat or sugar.  In some cases, you may get nutrition through a feeding tube that is passed through your nose and into your stomach (nasogastric tube, or NG tube). This may be done if you have uncontrolled vomiting or diarrhea. Beverages to avoid Certain beverages may make dehydration worse. While you rehydrate, avoid:  Alcohol.  Caffeine.  Drinks that contain a lot of sugar. These include: ? High-calorie sports drinks. ? Fruit juice that is not diluted. ? Soda.  Check nutrition labels to see how much sugar or caffeine a beverage contains. Signs of dehydration recovery You may be recovering from dehydration if:  You are urinating more  often than before you started rehydrating.  Your urine is clear or pale yellow.  Your energy level improves.  You vomit less frequently.  You have diarrhea less frequently.  Your appetite improves or returns to normal.  You feel less dizzy or less light-headed.  Your skin tone and color start to look more normal. Contact a health care provider if:  You continue to have symptoms of mild dehydration, such as: ? Thirst. ? Dry lips. ? Slightly dry mouth. ? Dry, warm skin. ? Dizziness.  You continue to vomit or have diarrhea. Get help right away if:  You have symptoms of dehydration that get worse.  You feel: ? Confused. ? Weak. ? Like you are going to faint.  You have not urinated in 6-8 hours.  You have very dark urine.  You have trouble breathing.  Your heart rate while sitting still is over 100 beats a minute.  You cannot drink fluids without vomiting.  You have vomiting or diarrhea that: ? Gets worse. ? Does not go away.  You have a fever. This information is not intended to replace advice given to you by your health care provider. Make sure you discuss any questions you have with your health care provider. Document Revised: 12/24/2016 Document Reviewed: 03/07/2015 Elsevier Patient Education  2020 [employer].

## 2020-01-12 NOTE — Progress Notes (Signed)
Pt stable at discharge.  

## 2020-01-12 NOTE — Telephone Encounter (Signed)
I was called by the nursing line to report results provided to them by the radiologist reading the CTA of the chest which the patient had on 12/17.  CTA of the chest showed multiple small pulmonary emboli without right heart strain. I called the patient several times last evening without reply and voicemail box that was full. I called her again this morning and was able to get through to her and confirmed that she has already been started on therapeutic Lovenox by Silvio Clayman.  She notes her breathing is stable with no acute chest pain or shortness of breath at this time. No dizziness or lightheadedness. She is hemodynamically stable at this time and not overtly short of breath. She was recommended to continue her therapeutic Lovenox and follow-up with Silvio Clayman next week to discuss the rest of the results of her CT chest that shows possible cancer progression.  Sullivan Lone MD MS Hematology/Oncology Physician [facility]

## 2020-01-13 DIAGNOSIS — R112 Nausea with vomiting, unspecified: Secondary | ICD-10-CM | POA: Diagnosis not present

## 2020-01-13 DIAGNOSIS — K529 Noninfective gastroenteritis and colitis, unspecified: Secondary | ICD-10-CM | POA: Diagnosis not present

## 2020-01-13 DIAGNOSIS — E440 Moderate protein-calorie malnutrition: Secondary | ICD-10-CM | POA: Diagnosis not present

## 2020-01-13 NOTE — Progress Notes (Signed)
Cynthia Frye   [phone]) [phone] [phone]   Clinic Follow up Note   Patient Care Team: Josetta Huddle, MD as PCP - General (Internal Medicine) Jonnie Finner, RN as Oncology Nurse Navigator Truitt Merle, MD as Consulting Physician (Oncology) Arta Silence, MD as Consulting Physician (Gastroenterology) [name], MD as Consulting Physician (General Surgery) 01/15/2020  CHIEF COMPLAINT: Follow up pancreas cancer and new right PE  SUMMARY OF ONCOLOGIC HISTORY: Oncology History Overview Note  Cancer Staging Pancreatic cancer [facility]) Staging form: Exocrine Pancreas, AJCC 8th Edition - Clinical stage from 10/18/2019: Stage IV (cT3, cN1, cM1) - Signed by Truitt Merle, MD on 10/18/2019    Pancreatic cancer ([place])  09/19/2019 Imaging   CT AP w contrast 09/19/19  IMPRESSION: 1. Findings are highly concerning for probable primary pancreatic adenocarcinoma in the anterior aspect of the pancreatic head. Several prominent borderline enlarged lymph nodes are noted in the hepatoduodenal nodal station, and there are multiple indeterminate liver lesions which are highly concerning for probable hepatic metastases. Further evaluation with nonemergent abdominal MRI with and without IV gadolinium with MRCP is recommended in the near future to better evaluate these findings.   09/25/2019 Procedure   Upper EUS by Dr Paulita Fujita  IMPRESSION -There was no evidence of significant pathology in the left lobe of the liver.  -A few lymph nodes were visualized and measures in the peripancreatic region and porta hepata region.  -Hyperchoic material consistent with sludge was visualized endosonographically in the gallbladder.  -There was no sign of significant pathology in the common bile duct.  -A mass was identified in the pancreatic head. If biopsy results show adenocarcinoma, it would be staged T3N1Mx by endosonographic criteria. Fine needle aspiration performed.    09/25/2019  Initial Biopsy   FINAL MICROSCOPIC DIAGNOSIS: Fine needle aspirate, Pancreas;  MALIGNANT CELLS PRESENT CONSISTENT WITH ADENOCARCINOMA.    10/02/2019 Initial Diagnosis   Pancreatic cancer ([place])   10/11/2019 Procedure   PAC placement y Dr Barry Dienes    10/15/2019 Imaging   CT Chest  IMPRESSION: 1. Small anterior left pneumothorax with dependent atelectasis in the left lower lobe. 2. Increased number of bilateral axillary and subpectoral lymph nodes with mild lymphadenopathy in the left axilla. While this would be an atypical presentation for metastatic pancreatic cancer, this possibility is not excluded 3. Main duct dilatation in the pancreas, better assessed on abdomen CT 09/19/2019.   10/16/2019 Imaging   MRI Abdomen  IMPRESSION: 1. Substantially motion degraded scan. 2. Probable persistent small anterior left lung base pneumothorax, better seen on chest CT from 1 day prior. 3. Poorly marginated hypoenhancing 3.7 x 2.9 cm pancreatic head mass, which appears to invade the anterior peripancreatic fat, compatible with known pancreatic adenocarcinoma. Diffuse irregular dilatation of the main pancreatic duct in the pancreatic body and tail. Mild narrowing of the main portal vein by the mass. Abdominal vasculature remains patent and otherwise uninvolved. 4. Numerous (greater than 10) small liver masses scattered throughout the liver, largest 1.0 cm, which appear to demonstrate targetoid enhancement on the limited motion degraded postcontrast sequences, compatible with liver metastases. 5. Mild porta hepatis adenopathy, suspicious for metastatic disease.   10/18/2019 Cancer Staging   Staging form: Exocrine Pancreas, AJCC 8th Edition - Clinical stage from 10/18/2019: Stage IV (cT3, cN1, cM1) - Signed by Truitt Merle, MD on 10/18/2019   10/24/2019 - 11/21/2019 Chemotherapy   First-line FOLFIRINOX q2weeks starting 10/24/19. C2 postponed due to N/V/D and dose reduced 20-30%. Given poor tolerance,  stopped after 2  cycles.    10/30/2019 Pathology Results   FINAL MICROSCOPIC DIAGNOSIS:   A. LIVER, LESION, BIOPSY:  -  Metastatic carcinoma  -  See comment   COMMENT:   By immunohistochemistry, the neoplastic cells are positive for  cytokeratin 7 and GATA3 with patchy nonspecific staining for PAX 8 but  negative for TTF-1, CDX2 and cytokeratin 20.  Overall, the findings are  consistent with metastasis of the patient's known breast carcinoma.  Prognostic panel (ER, PR, Her-2) is pending and will be reported in an  addendum.    ADDENDUM:   Dr. Laurence Ferrari notified us (November 01, 2019) that the patient was also  being worked up for a pancreatic mass.  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  In addition, ER and PR  are negative.  Gata-3 can be expressed in the pancreatic  adenocarcinomas; therefore, pancreatobiliary primary remains in the  differential.    11/02/2019 Genetic Testing   Negative genetic testing: no pathogenic variants detected in [employer] Common Hereditary Cancers Panel.  The report date is November 02, 2019.   The Common Hereditary Cancers Panel offered by [facility] includes sequencing and/or deletion duplication testing of the following 48 genes: APC, ATM, AXIN2, BARD1, BMPR1A, BRCA1, BRCA2, BRIP1, CDH1, CDK4, CDKN2A (p14ARF), CDKN2A (p16INK4a), CHEK2, CTNNA1, DICER1, EPCAM (Deletion/duplication testing only), GREM1 (promoter region deletion/duplication testing only), KIT, MEN1, MLH1, MSH2, MSH3, MSH6, MUTYH, NBN, NF1, NHTL1, PALB2, PDGFRA, PMS2, POLD1, POLE, PTEN, RAD50, RAD51C, RAD51D, RNF43, SDHB, SDHC, SDHD, SMAD4, SMARCA4. STK11, TP53, TSC1, TSC2, and VHL.  The following genes were evaluated for sequence changes only: SDHA and HOXB13 c.251G>A variant only.   11/04/2019 Imaging   CT AP  IMPRESSION: 1. Circumferential bowel wall thickening with adjacent fat stranding throughout the colon, most predominant in the transverse colon.  This is consistent with colitis, which may be infectious or inflammatory in etiology. 2. Ill-defined pancreatic head mass consistent with known malignancy, similar to mildly increased in comparison to prior CT. There are innumerable hypodense masses throughout the liver, increased in conspicuity in comparison to prior CT. Findings are worrisome for worsening metastatic disease. 3. Filling defect in the LEFT internal iliac vein, likely a thrombus with differential considerations including mixing artifact.   11/24/2019 Imaging   CT AP  IMPRESSION: Pancreatic head mass again noted compatible with patient's given history of pancreatic cancer.   Numerous metastases throughout the liver, enlarging since prior study.     12/10/2019 Procedure   Upper endoscopy by Dr Michail Sermon  IMPRESSION - Z-line regular, 42 cm from the incisors. - Esophageal ulcer with stigmata of recent bleeding. - Gastritis. Biopsied. - Mucosal changes in the duodenum. Biopsied. - Normal second portion of the duodenum. - Gastric and duodenal thickening concerning for inflammation from mets or due to mets from known pancreatic cancer.   FINAL MICROSCOPIC DIAGNOSIS:   A. DUODENUM, BIOPSY:  - Benign duodenal mucosa.  - No dysplasia or malignancy.   B. STOMACH, BIOPSY:  - Antral mucosa with mild chronic active inflammation.  - No intestinal metaplasia, dysplasia or carcinoma.   12/28/2019 -  Chemotherapy   Change her to second-line Gemcitabine 2 weeks on/1 week off starting 12/28/19. May add Cisplatin if tolerable.      CURRENT THERAPY: Changetosecond-line Gemcitabine 3 weeks on/1 week off starting 12/28/19  INTERVAL HISTORY: Cynthia Frye returns for follow up and treatment as scheduled. She received cycle 1 day 8 gemcitabine on 01/07/20. She received blood transfusion and IVF last week, felt  better. Also had CTA for dyspnea and positive d dimer. Imaging showed multiple small right pulmonary emboi without right  heart strain, no large clot burden. She continues therapeutic lovenox 80 mg BID. She has a cough that provokes emesis and conversational and exertional dyspnea. She can't keep much down. She takes anti-emetics before meals. She is still doing TPN which she attributes a lot of her symptoms. She has abdominal pain she relates to contracting muscles for cough and vomiting. Resting most of the day. Not sure if chemo is helping her. Denies fever, chills, chest pain, lightheadedness/dizziness, or new issues.    MEDICAL HISTORY:  Past Medical History:  Diagnosis Date  . Allergies    peanuts, corn, beans, red grapefruit, naval oranges  . Asthma    allergy shots and medication  . Cancer [facility])    pancreatic  . Collagen vascular disease ([place])   . DDD (degenerative disc disease), lumbar   . Eustachian tube dysfunction 12/2012   rhinitis, vertigo- Dr. Wilburn Cornelia, ENT   . Fibroids   . Heart murmur    Echo 1/18: EF 55-60, no RWMA, normal diastolic function, trivial AI, PASP 32  . History of cardiac catheterization    LHC 3/18: normal coronary arteries.   . History of nuclear stress test    ETT-Myoview 2/18: EF 62, + ECG response; apical and distal septal ischemia; intermediate risk.  Marland Kitchen Hypertension   . Iron deficiency anemia   . Lupus [facility]) 2011   Dr. Trudie Reed  . Migraine headache    trial of generic maxalt 10 mg, January 2021  . Obesity   . Seasonal allergies   . Seizure in childhood [facility])    as a child no treatment none x 30 years    SURGICAL HISTORY: Past Surgical History:  Procedure Laterality Date  . ABDOMINAL HYSTERECTOMY    . BACK SURGERY  2009  . BIOPSY  12/10/2019   Procedure: BIOPSY;  Surgeon: Wilford Corner, MD;  Location: [facility];  Service: Endoscopy;;  . COLONOSCOPY WITH PROPOFOL N/A 04/11/2012   Procedure: COLONOSCOPY WITH PROPOFOL;  Surgeon: Garlan Fair, MD;  Location: [facility];  Service: Endoscopy;  Laterality: N/A;  . ESOPHAGOGASTRODUODENOSCOPY (EGD) WITH  PROPOFOL N/A 12/10/2019   Procedure: ESOPHAGOGASTRODUODENOSCOPY (EGD) WITH PROPOFOL;  Surgeon: Wilford Corner, MD;  Location: [facility];  Service: Endoscopy;  Laterality: N/A;  . HERNIA REPAIR     as child unbilical  . B6-L partial discectomy and laminectomy     Dr. Christella Noa  . LEFT HEART CATH AND CORONARY ANGIOGRAPHY N/A 04/09/2016   Procedure: Left Heart Cath and Coronary Angiography;  Surgeon: Nelva Bush, MD;  Location: [place] CV LAB;  Service: Cardiovascular;  Laterality: N/A;  . MOUTH SURGERY    . PORTACATH PLACEMENT N/A 10/11/2019   Procedure: INSERTION PORT-A-CATH LEFT SUBCLAVIAN;  Surgeon: [name], MD;  Location: [place];  Service: General;  Laterality: N/A;    I have reviewed the social history and family history with the patient and they are unchanged from previous note.  ALLERGIES:  is allergic to cinnamon, peanut-containing drug products, prednisone, corn oil, corn-containing products, and other.  MEDICATIONS:  Current Outpatient Medications  Medication Sig Dispense Refill  . acetaminophen (TYLENOL) 500 MG tablet Take 1,000 mg by mouth every 6 (six) hours as needed for moderate pain.    Marland Kitchen albuterol (PROVENTIL HFA;VENTOLIN HFA) 108 (90 Base) MCG/ACT inhaler Inhale 2 puffs into the lungs every 6 (six) hours as needed for wheezing or shortness of  breath. 18 g 0  . Bepotastine Besilate (BEPREVE) 1.5 % SOLN Place 1 drop into both eyes 2 (two) times daily as needed (dry eyes).     . Cholecalciferol (VITAMIN D) 2000 units CAPS Take 2,000 Units by mouth daily.     Marland Kitchen dexamethasone (DECADRON) 4 MG tablet Take 1 tablet (4 mg total) by mouth daily. Take 1 tablet once daily for 3 to 5 days after chemo 20 tablet 0  . diclofenac (VOLTAREN) 75 MG EC tablet Take 1 tablet (75 mg total) by mouth 2 (two) times daily as needed.    . Dietary Management Product (RHEUMATE) CAPS Take 1 capsule by mouth daily.     . diphenoxylate-atropine (LOMOTIL) 2.5-0.025 MG  tablet Take 2 tablets by mouth 4 (four) times daily as needed for diarrhea or loose stools (use 2nd, if Imodium doesn't work. Max dose: 8 tablets/day.). (Patient taking differently: Take 2 tablets by mouth 4 (four) times daily as needed for diarrhea or loose stools. ) 20 tablet 0  . DIPROLENE 0.05 % ointment Apply 1 application topically daily as needed (skin irritation).     Marland Kitchen dronabinol (MARINOL) 2.5 MG capsule Take 1 capsule (2.5 mg total) by mouth 2 (two) times daily before a meal. (Patient taking differently: Take 2.5 mg by mouth 2 (two) times daily as needed (nausea). ) 30 capsule 0  . enoxaparin (LOVENOX) 80 MG/0.8ML injection Inject 0.8 mLs (80 mg total) into the skin every 12 (twelve) hours. 48 mL 2  . feeding supplement (ENSURE ENLIVE / ENSURE PLUS) LIQD Take 237 mLs by mouth 2 (two) times daily between meals. 237 mL 12  . ferrous sulfate 325 (65 FE) MG tablet Take 325 mg by mouth daily with breakfast.    . fluticasone (FLONASE) 50 MCG/ACT nasal spray Place 2 sprays into both nostrils daily. 16 g 12  . hydroxychloroquine (PLAQUENIL) 200 MG tablet Take 400 mg by mouth daily.   5  . lipase/protease/amylase 24000-76000 units CPEP Take 1 capsule (24,000 Units total) by mouth 3 (three) times daily with meals. 270 capsule 0  . loperamide (IMODIUM) 2 MG capsule Take 2 capsules (4 mg total) by mouth 2 (two) times daily as needed for diarrhea or loose stools (Use 1st). 30 capsule 0  . loratadine (CLARITIN) 10 MG tablet Take 10 mg by mouth daily.    Marland Kitchen LORazepam (ATIVAN) 0.5 MG tablet Take 1 tablet (0.5 mg total) by mouth every 8 (eight) hours as needed for anxiety (and anticiaptory n/v. DO NOT TAKE WITH XANAX). 30 tablet 0  . metoprolol succinate (TOPROL-XL) 50 MG 24 hr tablet Take 50 mg by mouth daily.     . mirtazapine (REMERON) 7.5 MG tablet Take 1 tablet (7.5 mg total) by mouth at bedtime. 30 tablet 0  . OLANZapine (ZYPREXA) 7.5 MG tablet Take 1 tablet (7.5 mg total) by mouth at bedtime. 30 tablet  0  . ondansetron (ZOFRAN) 8 MG tablet Take 1 tablet (8 mg total) by mouth every 8 (eight) hours as needed for nausea or vomiting. 30 tablet 3  . oxyCODONE (OXY IR/ROXICODONE) 5 MG immediate release tablet Take 1 tablet (5 mg total) by mouth every 6 (six) hours as needed for severe pain. 60 tablet 0  . pantoprazole (PROTONIX) 40 MG tablet Take 40 mg by mouth daily.    . polyethylene glycol (MIRALAX / GLYCOLAX) 17 g packet Take 17 g by mouth daily. 30 each 1  . potassium chloride 20 MEQ/15ML (10%) SOLN Take 15 mLs (20  mEq total) by mouth 2 (two) times daily. 750 mL 1  . senna-docusate (SENOKOT-S) 8.6-50 MG tablet Take 1 tablet by mouth 2 (two) times daily. 60 tablet 1  . sucralfate (CARAFATE) 1 GM/10ML suspension Take 10 mLs (1 g total) by mouth 4 (four) times daily -  with meals and at bedtime. 420 mL 3  . topiramate (TOPAMAX) 50 MG tablet Take 1 tablet (50 mg total) by mouth at bedtime.     No current facility-administered medications for this visit.    PHYSICAL EXAMINATION: ECOG PERFORMANCE STATUS: 3 - Symptomatic, >50% confined to bed  Vitals:   01/15/20 1333  BP: 115/83  Pulse: (!) 142  Resp: 16  Temp: 98.4 F (36.9 C)  SpO2: 100%   On ambulation Ow sat 97-99% on RA, HR up to 160   Filed Weights   01/15/20 1333  Weight: 177 lb 1.6 oz (80.3 kg)    GENERAL:alert, no distress and comfortable SKIN: no rash  EYES:  sclera clear OROPHARYNX: white coating to tongue  LUNGS: clear with normal breathing effort HEART: tachycardic, regular rhythm, no lower extremity edema ABDOMEN: abdomen soft, non-tender and normal bowel sounds NEURO: alert & oriented x 3 with fluent speech PAC without erythema   LABORATORY DATA:  I have reviewed the data as listed CBC Latest Ref Rng & Units 01/15/2020 01/09/2020 01/07/2020  WBC 4.0 - 10.5 K/uL 8.8 8.7 7.8  Hemoglobin 12.0 - 15.0 g/dL 9.7(L) 7.3(L) 7.9(L)  Hematocrit 36.0 - 46.0 % 29.5(L) 23.0(L) 24.1(L)  Platelets 150 - 400 K/uL 242 210 165      CMP Latest Ref Rng & Units 01/15/2020 01/09/2020 01/07/2020  Glucose 70 - 99 mg/dL 115(H) 111(H) 105(H)  BUN 6 - 20 mg/dL 20 23(H) 20  Creatinine 0.44 - 1.00 mg/dL 0.74 0.68 0.67  Sodium 135 - 145 mmol/L 136 139 135  Potassium 3.5 - 5.1 mmol/L 4.1 4.4 4.1  Chloride 98 - 111 mmol/L 101 104 101  CO2 22 - 32 mmol/L 25 36(H) 27  Calcium 8.9 - 10.3 mg/dL 9.1 9.3 9.1  Total Protein 6.5 - 8.1 g/dL 8.6(H) 8.0 7.8  Total Bilirubin 0.3 - 1.2 mg/dL 1.6(H) 1.2 1.1  Alkaline Phos 38 - 126 U/L 468(H) 396(H) 435(H)  AST 15 - 41 U/L 200(HH) 148(H) 119(H)  ALT 0 - 44 U/L 273(H) 171(H) 152(H)      RADIOGRAPHIC STUDIES: I have personally reviewed the radiological images as listed and agreed with the findings in the report. No results found.   ASSESSMENT & PLAN: Cynthia Frye a 59 y.o.femalewith    1. 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 Her EUS biopsy with Dr Paulita Fujita on 09/25/19 shows pancreatic adenocarcinoma. -liver biopsy 10/30/19 confirmed metastatic pancreatic cancer.[employer]One was requested. -she began first line or neoadjuvant FOLFIRINOX on 10/24/19, she tolerated poorly, required hospital admission and supportive care. This was stopped after 2 cycles.  -she began second line gemcitabine 2-3 weeks on/1 week off on 12/28/19. 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 She increased to 80 mg BID on 12/17 -CTA obtained 12/17 for dyspnea and + d-dimer, showed numerous small right PEs, no heart strain, no large clot burden -no hypoxia  4. Comorbidities: Lupus, HTN, Migraines  -Symptomatic in her joints, Dx many years ago  -She is being followed by Rheumatologist Dr Wyline Copas and being treated with weekly Methotrexate injections, Plaquenil andDiclofenac. -For Migraines is well controlled on Topomax. She has been seen by Dr Jaynee Eagles.   5. Anxiety, insomnia, Social Support  -She has been very anxious since diagnosis. -She lives with her husband in [place]. She does not have children. She does have a brother who lives close by but no other near relatives. -She plans to apply for disability -She has been referred to social work -She tried mirtazapine for appetite, thought it made her jittery but she is unsure.  She is willing to retry to see if this helps her insomnia  Disposition:  Cynthia Frye appear stable. She is s/p C1D8 single agent gemcitabine. She continues to have n/v and low PS. She remains on TPN, po intake has not significantly improved. She appears weak and dehydrated today BP 115/83 HR 142 at rest increased to 160 on ambulation. Rate confirmed by EKG.   She continues lovenox for bilateral DVT (left in 10/2019, new right DVT as of 12/13). Due to dyspnea at rest and + d dimer, CTA revealed numerous small right PE's, she had just increased from lovenox 120 mg daily to lovenox 80 mg BID on 12/17 after anti-Xa  level was subtherapeutic. She continues to have exertional dyspnea. No hypoxia. Continue lovenox BID, she does not need oxygen.   Labs reviewed. Anemia improved with blood transfusion. CMP shows worsening transaminitis AST 200 ALT 273 and Tbili 1.6. Unclear if this is related to dehydration vs chemo vs underlying liver metastasis. I doubt low dose gemcitbaine would cause this elevation, but possible. She has been referred for stat US to r/o biliary obstruction to see if she is a candidate for stenting. Appreciate GI input.   Will hold C1D15 chemo today. She was given IVF in clinic.   Her case was accepted for admission by hospitalist Dr. Darrick Meigs, but no beds available. She was transported to [place] and will be admitted through the ED. I gave report to charge nurse.   We had a goals of care discussion. It does not appear low dose chemo is helping her clinically, may be worsening her QOL. I suspect some of her symptoms are related to her underlying cancer rather than low dose chemo. Her PS is not adequate for more intensive chemo yet (planned to add abraxane). I recommend palliative care consult for goals of care discussion and symptom management for n/v.   Oncology will follow. She has outpatient f/up on 01/29/20, will keep that for now.   Orders Placed This Encounter  Procedures  . US Abdomen Complete    Standing Status:   Future    Number of Occurrences:   1    Standing Expiration Date:   01/14/2021    Order Specific Question:   Reason for Exam (SYMPTOM  OR DIAGNOSIS REQUIRED)    Answer:   met pancreas cancer to liver, new hyperbilirubinemia r/o biliary obstruction    Order Specific Question:   Preferred imaging location?    Answer:   [facility]  . EKG 12-Lead  . EKG 12-Lead    Ordered by an unspecified provider    All questions were answered. The patient knows to call the clinic with any problems, questions or concerns. No barriers to learning were detected. Total encounter time was 40  minutes.      Alla Feeling, NP 01/15/20

## 2020-01-14 DIAGNOSIS — E440 Moderate protein-calorie malnutrition: Secondary | ICD-10-CM | POA: Diagnosis not present

## 2020-01-14 DIAGNOSIS — K529 Noninfective gastroenteritis and colitis, unspecified: Secondary | ICD-10-CM | POA: Diagnosis not present

## 2020-01-14 DIAGNOSIS — R112 Nausea with vomiting, unspecified: Secondary | ICD-10-CM | POA: Diagnosis not present

## 2020-01-15 ENCOUNTER — Inpatient Hospital Stay (HOSPITAL_COMMUNITY)
Admission: EM | Admit: 2020-01-15 | Discharge: 2020-01-18 | DRG: 435 | Disposition: A | Discharge status: Home / Self Care | Payer: BC Managed Care – PPO | Attending: Internal Medicine | Admitting: Internal Medicine

## 2020-01-15 ENCOUNTER — Inpatient Hospital Stay (HOSPITAL_BASED_OUTPATIENT_CLINIC_OR_DEPARTMENT_OTHER): Payer: BC Managed Care – PPO | Admitting: Nurse Practitioner

## 2020-01-15 ENCOUNTER — Other Ambulatory Visit: Payer: Self-pay

## 2020-01-15 ENCOUNTER — Telehealth: Payer: Self-pay | Admitting: Dietician

## 2020-01-15 ENCOUNTER — Telehealth: Payer: Self-pay

## 2020-01-15 ENCOUNTER — Inpatient Hospital Stay: Payer: BC Managed Care – PPO

## 2020-01-15 ENCOUNTER — Encounter: Payer: Self-pay | Admitting: Nurse Practitioner

## 2020-01-15 ENCOUNTER — Encounter (HOSPITAL_COMMUNITY): Payer: Self-pay | Admitting: *Deleted

## 2020-01-15 ENCOUNTER — Ambulatory Visit (HOSPITAL_COMMUNITY)
Admission: RE | Admit: 2020-01-15 | Discharge: 2020-01-15 | Disposition: A | Discharge status: Home / Self Care | Payer: BC Managed Care – PPO | Source: Ambulatory Visit | Attending: Nurse Practitioner | Admitting: Nurse Practitioner

## 2020-01-15 VITALS — BP 115/83 | HR 142 | Temp 98.4°F | Resp 16 | Ht 67.0 in | Wt 177.1 lb

## 2020-01-15 VITALS — HR 132

## 2020-01-15 DIAGNOSIS — T451X5A Adverse effect of antineoplastic and immunosuppressive drugs, initial encounter: Secondary | ICD-10-CM

## 2020-01-15 DIAGNOSIS — E876 Hypokalemia: Secondary | ICD-10-CM | POA: Diagnosis not present

## 2020-01-15 DIAGNOSIS — Z86718 Personal history of other venous thrombosis and embolism: Secondary | ICD-10-CM

## 2020-01-15 DIAGNOSIS — Z8505 Personal history of malignant neoplasm of liver: Secondary | ICD-10-CM | POA: Diagnosis not present

## 2020-01-15 DIAGNOSIS — D6481 Anemia due to antineoplastic chemotherapy: Secondary | ICD-10-CM | POA: Diagnosis not present

## 2020-01-15 DIAGNOSIS — Z79899 Other long term (current) drug therapy: Secondary | ICD-10-CM

## 2020-01-15 DIAGNOSIS — Z20822 Contact with and (suspected) exposure to covid-19: Secondary | ICD-10-CM | POA: Diagnosis present

## 2020-01-15 DIAGNOSIS — C799 Secondary malignant neoplasm of unspecified site: Secondary | ICD-10-CM | POA: Diagnosis not present

## 2020-01-15 DIAGNOSIS — Z7189 Other specified counseling: Secondary | ICD-10-CM | POA: Diagnosis not present

## 2020-01-15 DIAGNOSIS — Z6827 Body mass index (BMI) 27.0-27.9, adult: Secondary | ICD-10-CM | POA: Diagnosis not present

## 2020-01-15 DIAGNOSIS — K828 Other specified diseases of gallbladder: Secondary | ICD-10-CM | POA: Diagnosis not present

## 2020-01-15 DIAGNOSIS — C787 Secondary malignant neoplasm of liver and intrahepatic bile duct: Secondary | ICD-10-CM | POA: Diagnosis not present

## 2020-01-15 DIAGNOSIS — R000 Tachycardia, unspecified: Secondary | ICD-10-CM

## 2020-01-15 DIAGNOSIS — R7989 Other specified abnormal findings of blood chemistry: Secondary | ICD-10-CM

## 2020-01-15 DIAGNOSIS — K7689 Other specified diseases of liver: Secondary | ICD-10-CM | POA: Diagnosis not present

## 2020-01-15 DIAGNOSIS — R634 Abnormal weight loss: Secondary | ICD-10-CM | POA: Diagnosis not present

## 2020-01-15 DIAGNOSIS — E669 Obesity, unspecified: Secondary | ICD-10-CM | POA: Diagnosis not present

## 2020-01-15 DIAGNOSIS — R933 Abnormal findings on diagnostic imaging of other parts of digestive tract: Secondary | ICD-10-CM | POA: Diagnosis not present

## 2020-01-15 DIAGNOSIS — F419 Anxiety disorder, unspecified: Secondary | ICD-10-CM | POA: Diagnosis not present

## 2020-01-15 DIAGNOSIS — Z515 Encounter for palliative care: Secondary | ICD-10-CM | POA: Diagnosis not present

## 2020-01-15 DIAGNOSIS — C250 Malignant neoplasm of head of pancreas: Secondary | ICD-10-CM

## 2020-01-15 DIAGNOSIS — J939 Pneumothorax, unspecified: Secondary | ICD-10-CM | POA: Diagnosis not present

## 2020-01-15 DIAGNOSIS — R590 Localized enlarged lymph nodes: Secondary | ICD-10-CM | POA: Diagnosis not present

## 2020-01-15 DIAGNOSIS — G43909 Migraine, unspecified, not intractable, without status migrainosus: Secondary | ICD-10-CM | POA: Diagnosis not present

## 2020-01-15 DIAGNOSIS — I82422 Acute embolism and thrombosis of left iliac vein: Secondary | ICD-10-CM | POA: Diagnosis not present

## 2020-01-15 DIAGNOSIS — R17 Unspecified jaundice: Secondary | ICD-10-CM | POA: Diagnosis not present

## 2020-01-15 DIAGNOSIS — Z7901 Long term (current) use of anticoagulants: Secondary | ICD-10-CM | POA: Diagnosis not present

## 2020-01-15 DIAGNOSIS — R945 Abnormal results of liver function studies: Secondary | ICD-10-CM | POA: Diagnosis not present

## 2020-01-15 DIAGNOSIS — K831 Obstruction of bile duct: Secondary | ICD-10-CM | POA: Diagnosis present

## 2020-01-15 DIAGNOSIS — C259 Malignant neoplasm of pancreas, unspecified: Secondary | ICD-10-CM | POA: Diagnosis not present

## 2020-01-15 DIAGNOSIS — R112 Nausea with vomiting, unspecified: Secondary | ICD-10-CM

## 2020-01-15 DIAGNOSIS — Z8507 Personal history of malignant neoplasm of pancreas: Secondary | ICD-10-CM | POA: Diagnosis not present

## 2020-01-15 DIAGNOSIS — Z95828 Presence of other vascular implants and grafts: Secondary | ICD-10-CM

## 2020-01-15 DIAGNOSIS — Z823 Family history of stroke: Secondary | ICD-10-CM | POA: Diagnosis not present

## 2020-01-15 DIAGNOSIS — R6510 Systemic inflammatory response syndrome (SIRS) of non-infectious origin without acute organ dysfunction: Secondary | ICD-10-CM | POA: Diagnosis present

## 2020-01-15 DIAGNOSIS — E440 Moderate protein-calorie malnutrition: Secondary | ICD-10-CM | POA: Diagnosis not present

## 2020-01-15 DIAGNOSIS — I10 Essential (primary) hypertension: Secondary | ICD-10-CM | POA: Diagnosis present

## 2020-01-15 DIAGNOSIS — Z833 Family history of diabetes mellitus: Secondary | ICD-10-CM

## 2020-01-15 DIAGNOSIS — K8689 Other specified diseases of pancreas: Secondary | ICD-10-CM | POA: Diagnosis not present

## 2020-01-15 DIAGNOSIS — Z5111 Encounter for antineoplastic chemotherapy: Secondary | ICD-10-CM | POA: Diagnosis not present

## 2020-01-15 DIAGNOSIS — R52 Pain, unspecified: Secondary | ICD-10-CM

## 2020-01-15 DIAGNOSIS — T451X5D Adverse effect of antineoplastic and immunosuppressive drugs, subsequent encounter: Secondary | ICD-10-CM | POA: Diagnosis not present

## 2020-01-15 DIAGNOSIS — K529 Noninfective gastroenteritis and colitis, unspecified: Secondary | ICD-10-CM | POA: Diagnosis not present

## 2020-01-15 DIAGNOSIS — M329 Systemic lupus erythematosus, unspecified: Secondary | ICD-10-CM | POA: Diagnosis not present

## 2020-01-15 LAB — CBC
HCT: 26.4 % — ABNORMAL LOW (ref 36.0–46.0)
Hemoglobin: 8.4 g/dL — ABNORMAL LOW (ref 12.0–15.0)
MCH: 29.8 pg (ref 26.0–34.0)
MCHC: 31.8 g/dL (ref 30.0–36.0)
MCV: 93.6 fL (ref 80.0–100.0)
Platelets: 189 10*3/uL (ref 150–400)
RBC: 2.82 MIL/uL — ABNORMAL LOW (ref 3.87–5.11)
RDW: 16.1 % — ABNORMAL HIGH (ref 11.5–15.5)
WBC: 8.1 10*3/uL (ref 4.0–10.5)
nRBC: 0 % (ref 0.0–0.2)

## 2020-01-15 LAB — CMP (CANCER CENTER ONLY)
ALT: 273 U/L — ABNORMAL HIGH (ref 0–44)
AST: 200 U/L (ref 15–41)
Albumin: 2.6 g/dL — ABNORMAL LOW (ref 3.5–5.0)
Alkaline Phosphatase: 468 U/L — ABNORMAL HIGH (ref 38–126)
Anion gap: 10 (ref 5–15)
BUN: 20 mg/dL (ref 6–20)
CO2: 25 mmol/L (ref 22–32)
Calcium: 9.1 mg/dL (ref 8.9–10.3)
Chloride: 101 mmol/L (ref 98–111)
Creatinine: 0.74 mg/dL (ref 0.44–1.00)
GFR, Estimated: >60 mL/min (ref >60)
Glucose, Bld: 115 mg/dL — ABNORMAL HIGH (ref 70–99)
Potassium: 4.1 mmol/L (ref 3.5–5.1)
Sodium: 136 mmol/L (ref 135–145)
Total Bilirubin: 1.6 mg/dL — ABNORMAL HIGH (ref 0.3–1.2)
Total Protein: 8.6 g/dL — ABNORMAL HIGH (ref 6.5–8.1)

## 2020-01-15 LAB — CBC WITH DIFFERENTIAL (CANCER CENTER ONLY)
Abs Immature Granulocytes: 0.14 10*3/uL — ABNORMAL HIGH (ref 0.00–0.07)
Basophils Absolute: 0 10*3/uL (ref 0.0–0.1)
Basophils Relative: 0 %
Eosinophils Absolute: 0 10*3/uL (ref 0.0–0.5)
Eosinophils Relative: 0 %
HCT: 29.5 % — ABNORMAL LOW (ref 36.0–46.0)
Hemoglobin: 9.7 g/dL — ABNORMAL LOW (ref 12.0–15.0)
Immature Granulocytes: 2 %
Lymphocytes Relative: 13 %
Lymphs Abs: 1.1 10*3/uL (ref 0.7–4.0)
MCH: 29.8 pg (ref 26.0–34.0)
MCHC: 32.9 g/dL (ref 30.0–36.0)
MCV: 90.5 fL (ref 80.0–100.0)
Monocytes Absolute: 1.4 10*3/uL — ABNORMAL HIGH (ref 0.1–1.0)
Monocytes Relative: 15 %
Neutro Abs: 6.1 10*3/uL (ref 1.7–7.7)
Neutrophils Relative %: 70 %
Platelet Count: 242 10*3/uL (ref 150–400)
RBC: 3.26 MIL/uL — ABNORMAL LOW (ref 3.87–5.11)
RDW: 16.2 % — ABNORMAL HIGH (ref 11.5–15.5)
WBC Count: 8.8 10*3/uL (ref 4.0–10.5)
nRBC: 0.2 % (ref 0.0–0.2)

## 2020-01-15 LAB — LACTIC ACID, PLASMA
Lactic Acid, Venous: 1.3 mmol/L (ref 0.5–1.9)
Lactic Acid, Venous: 1.1 mmol/L (ref 0.5–1.9)

## 2020-01-15 LAB — LIPASE, BLOOD: Lipase: 21 U/L (ref 11–51)

## 2020-01-15 LAB — RESP PANEL BY RT-PCR (FLU A&B, COVID) ARPGX2
Influenza A by PCR: NEGATIVE
Influenza B by PCR: NEGATIVE
SARS Coronavirus 2 by RT PCR: NEGATIVE

## 2020-01-15 LAB — CREATININE, SERUM
Creatinine, Ser: 0.57 mg/dL (ref 0.44–1.00)
GFR, Estimated: >60 mL/min (ref >60)

## 2020-01-15 MED ORDER — PROMETHAZINE HCL 25 MG PO TABS: 12.5000 mg | ORAL_TABLET | Freq: Four times a day (QID) | ORAL | Status: DC | PRN

## 2020-01-15 MED ORDER — DICLOFENAC SODIUM 75 MG PO TBEC
75.0000 mg | DELAYED_RELEASE_TABLET | Freq: Two times a day (BID) | ORAL | Status: DC
  Administered 2020-01-15 – 2020-01-18 (×6): 75 mg via ORAL
  Filled 2020-01-15 (×6): qty 1

## 2020-01-15 MED ORDER — ENOXAPARIN SODIUM 40 MG/0.4ML ~~LOC~~ SOLN: 40.0000 mg | SUBCUTANEOUS | Status: DC

## 2020-01-15 MED ORDER — MORPHINE SULFATE (PF) 4 MG/ML IV SOLN
4.0000 mg | Freq: Once | INTRAVENOUS | Status: AC
  Administered 2020-01-15: 4 mg via INTRAVENOUS
  Filled 2020-01-15: qty 1

## 2020-01-15 MED ORDER — SUCRALFATE 1 GM/10ML PO SUSP
1.0000 g | Freq: Three times a day (TID) | ORAL | Status: DC
  Administered 2020-01-16 – 2020-01-18 (×8): 1 g via ORAL
  Filled 2020-01-15 (×8): qty 10

## 2020-01-15 MED ORDER — ONDANSETRON HCL 4 MG PO TABS: 8.0000 mg | ORAL_TABLET | Freq: Three times a day (TID) | ORAL | Status: DC | PRN

## 2020-01-15 MED ORDER — MIRTAZAPINE 15 MG PO TABS
7.5000 mg | ORAL_TABLET | Freq: Every day | ORAL | Status: DC
  Administered 2020-01-15 – 2020-01-17 (×3): 7.5 mg via ORAL
  Filled 2020-01-15 (×3): qty 1

## 2020-01-15 MED ORDER — ENOXAPARIN SODIUM 80 MG/0.8ML ~~LOC~~ SOLN
80.0000 mg | Freq: Two times a day (BID) | SUBCUTANEOUS | Status: DC
  Administered 2020-01-15 – 2020-01-18 (×6): 80 mg via SUBCUTANEOUS
  Filled 2020-01-15 (×7): qty 0.8

## 2020-01-15 MED ORDER — ACETAMINOPHEN 325 MG PO TABS
650.0000 mg | ORAL_TABLET | Freq: Four times a day (QID) | ORAL | Status: DC | PRN
  Administered 2020-01-15 – 2020-01-16 (×2): 650 mg via ORAL
  Filled 2020-01-15 (×2): qty 2

## 2020-01-15 MED ORDER — SENNOSIDES-DOCUSATE SODIUM 8.6-50 MG PO TABS: 1.0000 | ORAL_TABLET | Freq: Two times a day (BID) | ORAL | Status: DC | PRN

## 2020-01-15 MED ORDER — SODIUM CHLORIDE 0.9% FLUSH
10.0000 mL | Freq: Once | INTRAVENOUS | Status: DC | PRN
  Filled 2020-01-15: qty 10

## 2020-01-15 MED ORDER — ONDANSETRON HCL 4 MG/2ML IJ SOLN
4.0000 mg | Freq: Once | INTRAMUSCULAR | Status: AC
  Administered 2020-01-15: 4 mg via INTRAVENOUS
  Filled 2020-01-15: qty 2

## 2020-01-15 MED ORDER — HYDROXYCHLOROQUINE SULFATE 200 MG PO TABS
400.0000 mg | ORAL_TABLET | Freq: Every day | ORAL | Status: DC
  Administered 2020-01-16 – 2020-01-18 (×3): 400 mg via ORAL
  Filled 2020-01-15 (×3): qty 2

## 2020-01-15 MED ORDER — OLANZAPINE 5 MG PO TABS
7.5000 mg | ORAL_TABLET | Freq: Every day | ORAL | Status: DC
  Administered 2020-01-15 – 2020-01-17 (×3): 7.5 mg via ORAL
  Filled 2020-01-15: qty 1
  Filled 2020-01-15 (×2): qty 2

## 2020-01-15 MED ORDER — LORAZEPAM 0.5 MG PO TABS: 0.5000 mg | ORAL_TABLET | Freq: Three times a day (TID) | ORAL | Status: DC | PRN

## 2020-01-15 MED ORDER — SODIUM CHLORIDE 0.9 % IV SOLN
Freq: Once | INTRAVENOUS | Status: AC
  Filled 2020-01-15: qty 250

## 2020-01-15 MED ORDER — PANCRELIPASE (LIP-PROT-AMYL) 12000-38000 UNITS PO CPEP
24000.0000 [IU] | ORAL_CAPSULE | Freq: Three times a day (TID) | ORAL | Status: DC
  Administered 2020-01-16 – 2020-01-18 (×6): 24000 [IU] via ORAL
  Filled 2020-01-15 (×7): qty 2

## 2020-01-15 MED ORDER — DEXTROSE IN LACTATED RINGERS 5 % IV SOLN: INTRAVENOUS | Status: DC

## 2020-01-15 MED ORDER — METOPROLOL SUCCINATE ER 50 MG PO TB24
50.0000 mg | ORAL_TABLET | Freq: Every day | ORAL | Status: DC
  Administered 2020-01-16 – 2020-01-18 (×3): 50 mg via ORAL
  Filled 2020-01-15 (×3): qty 1

## 2020-01-15 MED ORDER — DIPHENOXYLATE-ATROPINE 2.5-0.025 MG PO TABS: 2.0000 | ORAL_TABLET | Freq: Four times a day (QID) | ORAL | Status: DC | PRN

## 2020-01-15 MED ORDER — VITAMIN D3 25 MCG (1000 UNIT) PO TABS
2000.0000 [IU] | ORAL_TABLET | Freq: Every day | ORAL | Status: DC
  Administered 2020-01-16 – 2020-01-18 (×3): 2000 [IU] via ORAL
  Filled 2020-01-15 (×3): qty 2

## 2020-01-15 MED ORDER — HEPARIN SOD (PORK) LOCK FLUSH 100 UNIT/ML IV SOLN
500.0000 [IU] | Freq: Once | INTRAVENOUS | Status: DC | PRN
  Filled 2020-01-15: qty 5

## 2020-01-15 MED ORDER — RHEUMATE PO CAPS: 1.0000 | ORAL_CAPSULE | Freq: Every day | ORAL | Status: DC

## 2020-01-15 MED ORDER — LOPERAMIDE HCL 2 MG PO CAPS: 4.0000 mg | ORAL_CAPSULE | Freq: Two times a day (BID) | ORAL | Status: DC | PRN

## 2020-01-15 MED ORDER — FA-PYRIDOXINE-CYANOCOBALAMIN 2.5-25-2 MG PO TABS
1.0000 | ORAL_TABLET | Freq: Every day | ORAL | Status: DC
  Administered 2020-01-16 – 2020-01-18 (×3): 1 via ORAL
  Filled 2020-01-15 (×3): qty 1

## 2020-01-15 MED ORDER — SODIUM CHLORIDE 0.9 % IV BOLUS
1000.0000 mL | Freq: Once | INTRAVENOUS | Status: AC
  Administered 2020-01-15: 1000 mL via INTRAVENOUS

## 2020-01-15 MED ORDER — DEXAMETHASONE 4 MG PO TABS
4.0000 mg | ORAL_TABLET | Freq: Every day | ORAL | Status: DC
  Administered 2020-01-16: 4 mg via ORAL
  Filled 2020-01-15: qty 1

## 2020-01-15 MED ORDER — FERROUS SULFATE 325 (65 FE) MG PO TABS
325.0000 mg | ORAL_TABLET | Freq: Every day | ORAL | Status: DC
  Administered 2020-01-16 – 2020-01-18 (×3): 325 mg via ORAL
  Filled 2020-01-15 (×3): qty 1

## 2020-01-15 MED ORDER — SODIUM CHLORIDE 0.9% FLUSH
10.0000 mL | Freq: Once | INTRAVENOUS | Status: AC
Start: 2020-01-15 — End: 2020-01-15
  Administered 2020-01-15: 10 mL
  Filled 2020-01-15: qty 10

## 2020-01-15 MED ORDER — HYDROMORPHONE HCL 1 MG/ML IJ SOLN
0.5000 mg | INTRAMUSCULAR | Status: DC | PRN
  Administered 2020-01-15: 0.5 mg via INTRAVENOUS
  Administered 2020-01-16: 1 mg via INTRAVENOUS
  Filled 2020-01-15 (×2): qty 1

## 2020-01-15 MED ORDER — FLUTICASONE PROPIONATE 50 MCG/ACT NA SUSP: 2.0000 | Freq: Every day | NASAL | Status: DC | PRN

## 2020-01-15 MED ORDER — OXYCODONE HCL 5 MG PO TABS
5.0000 mg | ORAL_TABLET | Freq: Four times a day (QID) | ORAL | Status: DC | PRN
  Administered 2020-01-16: 5 mg via ORAL
  Filled 2020-01-15 (×2): qty 1

## 2020-01-15 MED ORDER — TOPIRAMATE 25 MG PO TABS: 50.0000 mg | ORAL_TABLET | Freq: Every day | ORAL | Status: DC | PRN

## 2020-01-15 MED ORDER — DRONABINOL 2.5 MG PO CAPS
2.5000 mg | ORAL_CAPSULE | Freq: Two times a day (BID) | ORAL | Status: DC | PRN
Start: 2020-01-15 — End: 2020-01-18

## 2020-01-15 NOTE — ED Provider Notes (Signed)
[facility] DEPT Provider Note   CSN: [id number] Arrival date & time: 01/15/20  1713     History No chief complaint on file.   Cynthia Frye is a 59 y.o. female presenting for evaluation of n/v, abd pain, elevated LFTs.   Patient states she is currently being treated for pancreatic cancer.  She was supposed to have chemo today, but this was not done due to worsening symptoms and lab work.  Patient states she has persistent nausea and vomiting, and constant pain.  Nothing new today.  No new fevers or chills.  She takes antiemetics, states this usually does not help.  She has had nothing to eat or drink today.  Lab work at the oncology office today showed gradually worsening LFTs.  There is concern for possible biliary obstruction.  Oncology requested patient be admitted for further work-up and evaluation, this was sent to the ED.  Patient denies recent medication changes.  She is currently on Lovenox due to PEs.  No new chest pain, shortness of breath, urinary symptoms, abnormal bowel movements.  No recent medication changes.  Additional history obtained from chart review.  I reviewed patient's oncology notes and labs.  She does have gradually worsening LFTs.  Ultrasound was ordered and has been completed, but radiology read is not posted yet.  HPI     Past Medical History:  Diagnosis Date  . Allergies    peanuts, corn, beans, red grapefruit, naval oranges  . Asthma    allergy shots and medication  . Cancer [facility])    pancreatic  . Collagen vascular disease ([place])   . DDD (degenerative disc disease), lumbar   . Eustachian tube dysfunction 12/2012   rhinitis, vertigo- Dr. Wilburn Cornelia, ENT   . Fibroids   . Heart murmur    Echo 1/18: EF 55-60, no RWMA, normal diastolic function, trivial AI, PASP 32  . History of cardiac catheterization    LHC 3/18: normal coronary arteries.   . History of nuclear stress test    ETT-Myoview 2/18: EF 62, + ECG response;  apical and distal septal ischemia; intermediate risk.  Marland Kitchen Hypertension   . Iron deficiency anemia   . Lupus [facility]) 2011   Dr. Trudie Reed  . Migraine headache    trial of generic maxalt 10 mg, January 2021  . Obesity   . Seasonal allergies   . Seizure in childhood [facility])    as a child no treatment none x 30 years    Patient Active Problem List   Diagnosis Date Noted  . Biliary obstruction 01/15/2020  . Abdominal pain 01/04/2020  . Dysphagia 12/10/2019  . Malnutrition of moderate degree 11/26/2019  . 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 Constipation 11/25/2019  . Generalized abdominal pain   . Nausea and vomiting in adult 11/24/2019  . Dehydration 11/24/2019  . Prolonged QT interval 11/24/2019  . Emesis   . Tachycardia   . Genetic testing 11/12/2019  . Colitis 11/04/2019  . Acute deep vein thrombosis of left iliac vein (HCC) 11/04/2019  . Hypokalemia 11/04/2019  . Port-A-Cath in place 10/24/2019  . Goals of care, counseling/discussion 10/18/2019  . Pancreatic cancer ([place]) 10/02/2019  . Chronic migraine without aura without status migrainosus, not intractable 04/23/2019  . Migraine with aura and without status migrainosus, not intractable 04/23/2019  . Essential hypertension 04/29/2016  . Lupus (systemic lupus erythematosus) ([place]) 04/29/2016  . Asthma 04/29/2016  . Seasonal allergies 04/29/2016  . Abnormal stress test 04/09/2016  Past Surgical History:  Procedure Laterality Date  . ABDOMINAL HYSTERECTOMY    . BACK SURGERY  2009  . BIOPSY  12/10/2019   Procedure: BIOPSY;  Surgeon: Wilford Corner, MD;  Location: [facility];  Service: Endoscopy;;  . COLONOSCOPY WITH PROPOFOL N/A 04/11/2012   Procedure: COLONOSCOPY WITH PROPOFOL;  Surgeon: Garlan Fair, MD;  Location: [facility];  Service: Endoscopy;  Laterality: N/A;  . ESOPHAGOGASTRODUODENOSCOPY (EGD) WITH PROPOFOL N/A 12/10/2019   Procedure: ESOPHAGOGASTRODUODENOSCOPY (EGD) WITH PROPOFOL;  Surgeon:  Wilford Corner, MD;  Location: [facility];  Service: Endoscopy;  Laterality: N/A;  . HERNIA REPAIR     as child unbilical  . Q5-Z partial discectomy and laminectomy     Dr. Christella Noa  . LEFT HEART CATH AND CORONARY ANGIOGRAPHY N/A 04/09/2016   Procedure: Left Heart Cath and Coronary Angiography;  Surgeon: Nelva Bush, MD;  Location: [place] CV LAB;  Service: Cardiovascular;  Laterality: N/A;  . MOUTH SURGERY    . PORTACATH PLACEMENT N/A 10/11/2019   Procedure: INSERTION PORT-A-CATH LEFT SUBCLAVIAN;  Surgeon: [name], MD;  Location: [place];  Service: General;  Laterality: N/A;     OB History   No obstetric history on file.     Family History  Problem Relation Age of Onset  . Asthma Mother   . Diabetes Mother   . Hypertension Mother   . Heart attack Father 76  . CAD Father   . Stroke Maternal Aunt   . Stroke Paternal Aunt   . Stroke Paternal Uncle   . Gout Other        uncles   . Cancer Niece        paternal half sister's daughter; unknown type; unknown age diagnosed  . Ovarian cancer Neg Hx   . Breast cancer Neg Hx   . Colon cancer Neg Hx   . Migraines Neg Hx     Social History   Tobacco Use  . Smoking status: Never Smoker  . Smokeless tobacco: Never Used  Vaping Use  . Vaping Use: Never used  Substance Use Topics  . Alcohol use: No  . Drug use: No    Home Medications Prior to Admission medications   Medication Sig Start Date End Date Taking? Authorizing Provider  acetaminophen (TYLENOL) 500 MG tablet Take 1,000 mg by mouth every 6 (six) hours as needed for moderate pain.    [provider]  albuterol (PROVENTIL HFA;VENTOLIN HFA) 108 (90 Base) MCG/ACT inhaler Inhale 2 puffs into the lungs every 6 (six) hours as needed for wheezing or shortness of breath. 04/29/16   Harrison Mons, PA  Bepotastine Besilate (BEPREVE) 1.5 % SOLN Place 1 drop into both eyes 2 (two) times daily as needed (dry eyes).     [provider]   Cholecalciferol (VITAMIN D) 2000 units CAPS Take 2,000 Units by mouth daily.     [provider]  dexamethasone (DECADRON) 4 MG tablet Take 1 tablet (4 mg total) by mouth daily. Take 1 tablet once daily for 3 to 5 days after chemo 10/29/19   Alla Feeling, NP  diclofenac (VOLTAREN) 75 MG EC tablet Take 1 tablet (75 mg total) by mouth 2 (two) times daily as needed. 12/12/19   Shelly Coss, MD  Dietary Management Product (RHEUMATE) CAPS Take 1 capsule by mouth daily.     [provider]  diphenoxylate-atropine (LOMOTIL) 2.5-0.025 MG tablet Take 2 tablets by mouth 4 (four) times daily as needed for diarrhea or loose stools (  use 2nd, if Imodium doesn't work. Max dose: 8 tablets/day.). Patient taking differently: Take 2 tablets by mouth 4 (four) times daily as needed for diarrhea or loose stools.  11/09/19   Orson Slick, MD  DIPROLENE 0.05 % ointment Apply 1 application topically daily as needed (skin irritation).  04/08/15   [provider]  dronabinol (MARINOL) 2.5 MG capsule Take 1 capsule (2.5 mg total) by mouth 2 (two) times daily before a meal. Patient taking differently: Take 2.5 mg by mouth 2 (two) times daily as needed (nausea).  11/14/19   Truitt Merle, MD  enoxaparin (LOVENOX) 80 MG/0.8ML injection Inject 0.8 mLs (80 mg total) into the skin every 12 (twelve) hours. 01/09/20 04/08/20  Alla Feeling, NP  feeding supplement (ENSURE ENLIVE / ENSURE PLUS) LIQD Take 237 mLs by mouth 2 (two) times daily between meals. 11/09/19   Hongalgi, Lenis Dickinson, MD  ferrous sulfate 325 (65 FE) MG tablet Take 325 mg by mouth daily with breakfast.    [provider]  fluticasone (FLONASE) 50 MCG/ACT nasal spray Place 2 sprays into both nostrils daily. 04/29/16   Harrison Mons, PA  hydroxychloroquine (PLAQUENIL) 200 MG tablet Take 400 mg by mouth daily.  06/17/14   [provider]  lipase/protease/amylase 24000-76000 units CPEP Take 1 capsule (24,000 Units total) by  mouth 3 (three) times daily with meals. 12/12/19   Shelly Coss, MD  loperamide (IMODIUM) 2 MG capsule Take 2 capsules (4 mg total) by mouth 2 (two) times daily as needed for diarrhea or loose stools (Use 1st). 11/09/19   Hongalgi, Lenis Dickinson, MD  loratadine (CLARITIN) 10 MG tablet Take 10 mg by mouth daily.    [provider]  LORazepam (ATIVAN) 0.5 MG tablet Take 1 tablet (0.5 mg total) by mouth every 8 (eight) hours as needed for anxiety (and anticiaptory n/v. DO NOT TAKE WITH XANAX). 11/21/19   Alla Feeling, NP  metoprolol succinate (TOPROL-XL) 50 MG 24 hr tablet Take 50 mg by mouth daily.     [provider]  mirtazapine (REMERON) 7.5 MG tablet Take 1 tablet (7.5 mg total) by mouth at bedtime. 01/08/20   Alla Feeling, NP  OLANZapine (ZYPREXA) 7.5 MG tablet Take 1 tablet (7.5 mg total) by mouth at bedtime. 12/12/19   Shelly Coss, MD  ondansetron (ZOFRAN) 8 MG tablet Take 1 tablet (8 mg total) by mouth every 8 (eight) hours as needed for nausea or vomiting. 12/24/19   Truitt Merle, MD  oxyCODONE (OXY IR/ROXICODONE) 5 MG immediate release tablet Take 1 tablet (5 mg total) by mouth every 6 (six) hours as needed for severe pain. 12/18/19   Truitt Merle, MD  pantoprazole (PROTONIX) 40 MG tablet Take 40 mg by mouth daily.    [provider]  polyethylene glycol (MIRALAX / GLYCOLAX) 17 g packet Take 17 g by mouth daily. 12/13/19   Shelly Coss, MD  potassium chloride 20 MEQ/15ML (10%) SOLN Take 15 mLs (20 mEq total) by mouth 2 (two) times daily. 11/21/19   Alla Feeling, NP  senna-docusate (SENOKOT-S) 8.6-50 MG tablet Take 1 tablet by mouth 2 (two) times daily. 12/12/19   Shelly Coss, MD  sucralfate (CARAFATE) 1 GM/10ML suspension Take 10 mLs (1 g total) by mouth 4 (four) times daily -  with meals and at bedtime. 12/12/19   Shelly Coss, MD  topiramate (TOPAMAX) 50 MG tablet Take 1 tablet (50 mg total) by mouth at bedtime. 11/09/19   Hongalgi, Lenis Dickinson, MD  prochlorperazine (COMPAZINE) 10 MG tablet Take 1 tablet (10 mg total) by mouth every 6 (six) hours as needed (Nausea or vomiting). Patient taking differently: Take 10 mg by mouth every 6 (six) hours as needed for nausea or vomiting.  10/18/19 12/18/19  Truitt Merle, MD    Allergies    Cinnamon, Peanut-containing drug products, Prednisone, Corn oil, Corn-containing products, and Other  Review of Systems   Review of Systems  Gastrointestinal: Positive for abdominal pain, nausea and vomiting.  Allergic/Immunologic: Positive for immunocompromised state.  Hematological: Bruises/bleeds easily.  All other systems reviewed and are negative.   Physical Exam Updated Vital Signs BP 129/86   Pulse (!) 124   Temp 100.3 F (37.9 C) (Oral)   Resp (!) 21   SpO2 100%   Physical Exam Vitals and nursing note reviewed.  Constitutional:      General: She is not in acute distress.    Appearance: She is well-developed and well-nourished.     Comments: Appears chronically ill  HENT:     Head: Normocephalic and atraumatic.  Eyes:     Extraocular Movements: Extraocular movements intact and EOM normal.     Conjunctiva/sclera: Conjunctivae normal.     Pupils: Pupils are equal, round, and reactive to light.  Cardiovascular:     Rate and Rhythm: Regular rhythm. Tachycardia present.     Pulses: Normal pulses and intact distal pulses.     Comments: Tachycardic between 120 and 130 Pulmonary:     Effort: Pulmonary effort is normal. No respiratory distress.     Breath sounds: Normal breath sounds. No wheezing.     Comments: Clear lung sounds Abdominal:     General: There is no distension.     Palpations: Abdomen is soft. There is no mass.     Tenderness: There is abdominal tenderness. There is no guarding or rebound.     Comments: Diffuse TTP of the abdomen.  No rigidity, guarding, distention.  No rebound.  Negative Murphy's.  Musculoskeletal:        General: Normal range of motion.     Cervical back:  Normal range of motion and neck supple.  Skin:    General: Skin is warm and dry.     Capillary Refill: Capillary refill takes less than 2 seconds.  Neurological:     Mental Status: She is alert and oriented to person, place, and time.  Psychiatric:        Mood and Affect: Mood and affect normal.     ED Results / Procedures / Treatments   Labs (all labs ordered are listed, but only abnormal results are displayed) Labs Reviewed  CULTURE, BLOOD (ROUTINE X 2)  CULTURE, BLOOD (ROUTINE X 2)  RESP PANEL BY RT-PCR (FLU A&B, COVID) ARPGX2  LIPASE, BLOOD  URINALYSIS, ROUTINE W REFLEX MICROSCOPIC  LACTIC ACID, PLASMA  LACTIC ACID, PLASMA    EKG None  Radiology US Abdomen Complete  Result Date: 01/15/2020 CLINICAL DATA:  Acetic pancreatic cancer to the liver. New hyperbilirubinemia. Rule out biliary obstruction EXAM: ABDOMEN ULTRASOUND COMPLETE COMPARISON:  Ultrasound abdomen 10/30/2019 FINDINGS: Gallbladder: Sludge versus tiny gallstones within the gallbladder lumen. No sonographic Murphy sign noted by sonographer. Common bile duct: Diameter: 6 mm Liver: Innumerable hypodense solid hepatic masses within bilateral hepatic lobes with the largest measuring 2.9 x 2.6 x 3 cm. Remainder of the hepatic demonstrates normal parenchymal echogenicity. Portal vein is patent on color Doppler imaging with normal direction of blood flow towards the liver. IVC: No abnormality visualized.  Pancreas: Not visualized due to gaseous abdomen. Spleen: Size and appearance within normal limits. Right Kidney: Length: 11.9 cm. Echogenicity within normal limits. No mass or hydronephrosis visualized. Left Kidney: Length: 11.8 cm. Echogenicity within normal limits. No mass or hydronephrosis visualized. Abdominal aorta: No aneurysm visualized. Other findings: None. IMPRESSION: 1. Gallbladder sludge versus cholelithiasis with no associated acute cholecystitis or choledocholithiasis. 2. Innumerable hepatic metastases.  Electronically Signed   By: Iven Finn M.D.   On: 01/15/2020 17:36    Procedures Procedures (including critical care time)  Medications Ordered in ED Medications  ondansetron (ZOFRAN) injection 4 mg (4 mg Intravenous Given 01/15/20 1756)  sodium chloride 0.9 % bolus 1,000 mL (1,000 mLs Intravenous New Bag/Given 01/15/20 1810)  morphine 4 MG/ML injection 4 mg (4 mg Intravenous Given 01/15/20 1756)    ED Course  I have reviewed the triage vital signs and the nursing notes.  Pertinent labs & imaging results that were available during my care of the patient were reviewed by me and considered in my medical decision making (see chart for details).    MDM Rules/Calculators/A&P                          Patient presenting for evaluation of persistent nausea and vomiting, abdominal pain, and concern for elevated LFTs.  On exam, patient appears chronically ill, but not in acute distress today.  She does have diffuse tenderness palpation of the abdomen.  She is tachycardic with a low-grade fever 100.3.  This concerning in the setting of chemo.  No leukocytosis.  Blood work drawn earlier.  Will add on lactic and blood cultures.  Ultrasound has already been performed, pending radiology read.  Will give nausea and pain medications and further fluids for tachycardia. Per oncology notes, they recommend patient be admitted, this was discussed with [facility] hospitalist team who is agreeable to admission.  Discussed with Dr. Jonelle Sidle from [facility] hospitalist service, patient to be admitted.  Final Clinical Impression(s) / ED Diagnoses Final diagnoses:  Nausea and vomiting, intractability of vomiting not specified, unspecified vomiting type  Elevated LFTs  Tachycardia    Rx / DC Orders ED Discharge Orders    None       Franchot Heidelberg, PA-C 01/15/20 1908    Valarie Merino, MD 01/16/20 [phone]

## 2020-01-15 NOTE — ED Triage Notes (Addendum)
Pt arrived from cancer center,hx pancreatic cancer, plan for admission through ED. [place] PTA for possible bilary duct obstruction.  States she went to center for chemo today, but it was held. Endorses weakness, n/v, but states not increased today, and has been consistent with condition.

## 2020-01-15 NOTE — Progress Notes (Signed)
Called ED to get report ,secretary stated that the nurse is not able to give me report at this moment.

## 2020-01-15 NOTE — Telephone Encounter (Signed)
Patient calls stating she had a rough night last night, got to coughing and vomiting a little a couple of times. She is wondering is she should plan to come in.  I called her back and instructed her to keep her scheduled appointments today starting at 1:00.  She verbalized an understanding.

## 2020-01-15 NOTE — Progress Notes (Signed)
Patient on Yellow MEWS with v/s as follows: Temp.- 99.3 BP- 130/94 Pulse rate- 125 Resp- 18 O2- 100 Room air Yellow MEWS escalated and we will continue to monitor.

## 2020-01-15 NOTE — H&P (Signed)
History and Physical   Cynthia Frye [id number] DOB: September 06, 1960 DOA: 01/15/2020  Referring MD/NP/PA: Dr. Roslynn Amble  PCP: Josetta Huddle, MD   Outpatient Specialists: Dr. Irene Limbo, oncology  Patient coming from: Oncologist office  Chief Complaint: Abdominal pain  HPI: Cynthia Frye is a 59 y.o. female with medical history significant of pancreatic cancer currently on chemotherapy, asthma, degenerative disc disease, hypertension, lupus migraine headaches who was supposed to have chemotherapy today and went to the cancer center.  She has been having persistent nausea vomiting and constant abdominal pain.  Patient has been on chronic antiemetics.  She was seen prechemo and found to have worsening liver function test.  There was concern for biliary obstruction.  Her pain was rated as 6 out of 10.  No significant change from her previous level of pain.  Due to worsening liver function however recommendation is to admit the patient for evaluation of biliary obstruction.  Patient noted to have fever with temperature up to 101.  No other symptoms.  She is laying still in bed..  ED Course: Temperature 100.3, blood pressure 137/81, pulse 142 respirate 22 and oxygen sat 99% on room air.  White count 8.8 hemoglobin 9.7 platelets 242.  Sodium 136 potassium 4.1 chloride 101 CO2 25 glucose 115 BUN 20 creatinine 0.74 calcium 9.1.  Alkaline phosphatase is 468 albumin 2.6 AST 200 ALT 273 total protein 8.6 and total bilirubin 1.6.  Ultrasound abdomen showed gallbladder sludge versus cholelithiasis with no acute cholecystitis.  There are multiple hepatic metastasis.  Patient will be admitted for further evaluation and treatment.  Review of Systems: As per HPI otherwise 10 point review of systems negative.    Past Medical History:  Diagnosis Date  . Allergies    peanuts, corn, beans, red grapefruit, naval oranges  . Asthma    allergy shots and medication  . Cancer [facility])    pancreatic  . Collagen vascular  disease ([place])   . DDD (degenerative disc disease), lumbar   . Eustachian tube dysfunction 12/2012   rhinitis, vertigo- Dr. Wilburn Cornelia, ENT   . Fibroids   . Heart murmur    Echo 1/18: EF 55-60, no RWMA, normal diastolic function, trivial AI, PASP 32  . History of cardiac catheterization    LHC 3/18: normal coronary arteries.   . History of nuclear stress test    ETT-Myoview 2/18: EF 62, + ECG response; apical and distal septal ischemia; intermediate risk.  Marland Kitchen Hypertension   . Iron deficiency anemia   . Lupus [facility]) 2011   Dr. Trudie Reed  . Migraine headache    trial of generic maxalt 10 mg, January 2021  . Obesity   . Seasonal allergies   . Seizure in childhood [facility])    as a child no treatment none x 30 years    Past Surgical History:  Procedure Laterality Date  . ABDOMINAL HYSTERECTOMY    . BACK SURGERY  2009  . BIOPSY  12/10/2019   Procedure: BIOPSY;  Surgeon: Wilford Corner, MD;  Location: [facility];  Service: Endoscopy;;  . COLONOSCOPY WITH PROPOFOL N/A 04/11/2012   Procedure: COLONOSCOPY WITH PROPOFOL;  Surgeon: Garlan Fair, MD;  Location: [facility];  Service: Endoscopy;  Laterality: N/A;  . ESOPHAGOGASTRODUODENOSCOPY (EGD) WITH PROPOFOL N/A 12/10/2019   Procedure: ESOPHAGOGASTRODUODENOSCOPY (EGD) WITH PROPOFOL;  Surgeon: Wilford Corner, MD;  Location: [facility];  Service: Endoscopy;  Laterality: N/A;  . HERNIA REPAIR     as child unbilical  . 0000000 partial discectomy and laminectomy  Dr. Christella Noa  . LEFT HEART CATH AND CORONARY ANGIOGRAPHY N/A 04/09/2016   Procedure: Left Heart Cath and Coronary Angiography;  Surgeon: Nelva Bush, MD;  Location: [facility];  Service: Cardiovascular;  Laterality: N/A;  . MOUTH SURGERY    . PORTACATH PLACEMENT N/A 10/11/2019   Procedure: INSERTION PORT-A-CATH LEFT SUBCLAVIAN;  Surgeon: [name], MD;  Location: [facility];  Service: General;  Laterality: N/A;     reports that she has never  smoked. She has never used smokeless tobacco. She reports that she does not drink alcohol and does not use drugs.  Allergies  Allergen Reactions  . Cinnamon Other (See Comments)    Other reaction(s): Other (See Comments) Unknown  On allergy test Unknown  On allergy test  . Peanut-Containing Drug Products Hives  . Prednisone Other (See Comments)    Interacts with another medicine she is taking  . Corn Oil Hives  . Corn-Containing Products Hives  . Other Rash    Red grapefruit and naval oranges- lips tingling and facial rash Potatoes, tomatoes, garlic, oregano/basil caused headache Other reaction(s): migratory headache    Family History  Problem Relation Age of Onset  . Asthma Mother   . Diabetes Mother   . Hypertension Mother   . Heart attack Father 61  . CAD Father   . Stroke Maternal Aunt   . Stroke Paternal Aunt   . Stroke Paternal Uncle   . Gout Other        uncles   . Cancer Niece        paternal half sister's daughter; unknown type; unknown age diagnosed  . Ovarian cancer Neg Hx   . Breast cancer Neg Hx   . Colon cancer Neg Hx   . Migraines Neg Hx      Prior to Admission medications   Medication Sig Start Date End Date Taking? Authorizing Provider  acetaminophen (TYLENOL) 500 MG tablet Take 1,000 mg by mouth every 6 (six) hours as needed for moderate pain.    [provider]  albuterol (PROVENTIL HFA;VENTOLIN HFA) 108 (90 Base) MCG/ACT inhaler Inhale 2 puffs into the lungs every 6 (six) hours as needed for wheezing or shortness of breath. 04/29/16   Harrison Mons, PA  Bepotastine Besilate (BEPREVE) 1.5 % SOLN Place 1 drop into both eyes 2 (two) times daily as needed (dry eyes).     [provider]  Cholecalciferol (VITAMIN D) 2000 units CAPS Take 2,000 Units by mouth daily.     [provider]  dexamethasone (DECADRON) 4 MG tablet Take 1 tablet (4 mg total) by mouth daily. Take 1 tablet once daily for 3 to 5 days after chemo 10/29/19    Alla Feeling, NP  diclofenac (VOLTAREN) 75 MG EC tablet Take 1 tablet (75 mg total) by mouth 2 (two) times daily as needed. 12/12/19   Shelly Coss, MD  Dietary Management Product (RHEUMATE) CAPS Take 1 capsule by mouth daily.     [provider]  diphenoxylate-atropine (LOMOTIL) 2.5-0.025 MG tablet Take 2 tablets by mouth 4 (four) times daily as needed for diarrhea or loose stools (use 2nd, if Imodium doesn't work. Max dose: 8 tablets/day.). Patient taking differently: Take 2 tablets by mouth 4 (four) times daily as needed for diarrhea or loose stools.  11/09/19   Orson Slick, MD  DIPROLENE 0.05 % ointment Apply 1 application topically daily as needed (skin irritation).  04/08/15   [provider]  dronabinol (MARINOL) 2.5  MG capsule Take 1 capsule (2.5 mg total) by mouth 2 (two) times daily before a meal. Patient taking differently: Take 2.5 mg by mouth 2 (two) times daily as needed (nausea).  11/14/19   Truitt Merle, MD  enoxaparin (LOVENOX) 80 MG/0.8ML injection Inject 0.8 mLs (80 mg total) into the skin every 12 (twelve) hours. 01/09/20 04/08/20  Alla Feeling, NP  feeding supplement (ENSURE ENLIVE / ENSURE PLUS) LIQD Take 237 mLs by mouth 2 (two) times daily between meals. 11/09/19   Hongalgi, Lenis Dickinson, MD  ferrous sulfate 325 (65 FE) MG tablet Take 325 mg by mouth daily with breakfast.    [provider]  fluticasone (FLONASE) 50 MCG/ACT nasal spray Place 2 sprays into both nostrils daily. 04/29/16   Harrison Mons, PA  hydroxychloroquine (PLAQUENIL) 200 MG tablet Take 400 mg by mouth daily.  06/17/14   [provider]  lipase/protease/amylase 24000-76000 units CPEP Take 1 capsule (24,000 Units total) by mouth 3 (three) times daily with meals. 12/12/19   Shelly Coss, MD  loperamide (IMODIUM) 2 MG capsule Take 2 capsules (4 mg total) by mouth 2 (two) times daily as needed for diarrhea or loose stools (Use 1st). 11/09/19   Hongalgi, Lenis Dickinson, MD   loratadine (CLARITIN) 10 MG tablet Take 10 mg by mouth daily.    [provider]  LORazepam (ATIVAN) 0.5 MG tablet Take 1 tablet (0.5 mg total) by mouth every 8 (eight) hours as needed for anxiety (and anticiaptory n/v. DO NOT TAKE WITH XANAX). 11/21/19   Alla Feeling, NP  metoprolol succinate (TOPROL-XL) 50 MG 24 hr tablet Take 50 mg by mouth daily.     [provider]  mirtazapine (REMERON) 7.5 MG tablet Take 1 tablet (7.5 mg total) by mouth at bedtime. 01/08/20   Alla Feeling, NP  OLANZapine (ZYPREXA) 7.5 MG tablet Take 1 tablet (7.5 mg total) by mouth at bedtime. 12/12/19   Shelly Coss, MD  ondansetron (ZOFRAN) 8 MG tablet Take 1 tablet (8 mg total) by mouth every 8 (eight) hours as needed for nausea or vomiting. 12/24/19   Truitt Merle, MD  oxyCODONE (OXY IR/ROXICODONE) 5 MG immediate release tablet Take 1 tablet (5 mg total) by mouth every 6 (six) hours as needed for severe pain. 12/18/19   Truitt Merle, MD  pantoprazole (PROTONIX) 40 MG tablet Take 40 mg by mouth daily.    [provider]  polyethylene glycol (MIRALAX / GLYCOLAX) 17 g packet Take 17 g by mouth daily. 12/13/19   Shelly Coss, MD  potassium chloride 20 MEQ/15ML (10%) SOLN Take 15 mLs (20 mEq total) by mouth 2 (two) times daily. 11/21/19   Alla Feeling, NP  senna-docusate (SENOKOT-S) 8.6-50 MG tablet Take 1 tablet by mouth 2 (two) times daily. 12/12/19   Shelly Coss, MD  sucralfate (CARAFATE) 1 GM/10ML suspension Take 10 mLs (1 g total) by mouth 4 (four) times daily -  with meals and at bedtime. 12/12/19   Shelly Coss, MD  topiramate (TOPAMAX) 50 MG tablet Take 1 tablet (50 mg total) by mouth at bedtime. 11/09/19   Hongalgi, Lenis Dickinson, MD  prochlorperazine (COMPAZINE) 10 MG tablet Take 1 tablet (10 mg total) by mouth every 6 (six) hours as needed (Nausea or vomiting). Patient taking differently: Take 10 mg by mouth every 6 (six) hours as needed for nausea or vomiting.  10/18/19 12/18/19   Truitt Merle, MD    Physical Exam: Vitals:   01/15/20 1729 01/15/20 1730 01/15/20 1745  01/15/20 1800  BP: 117/87 (!) 133/92 (!) 132/91 129/86  Pulse:  (!) 130 (!) 124 (!) 124  Resp:  18 (!) 21 (!) 21  Temp: 100.3 F (37.9 C)     TempSrc: Oral     SpO2:  99% 99% 100%      Constitutional: Chronically ill looking, cachectic Vitals:   01/15/20 1729 01/15/20 1730 01/15/20 1745 01/15/20 1800  BP: 117/87 (!) 133/92 (!) 132/91 129/86  Pulse:  (!) 130 (!) 124 (!) 124  Resp:  18 (!) 21 (!) 21  Temp: 100.3 F (37.9 C)     TempSrc: Oral     SpO2:  99% 99% 100%   Eyes: PERRL, lids and conjunctivae normal ENMT: Mucous membranes are moist. Posterior pharynx clear of any exudate or lesions.Normal dentition.  Neck: normal, supple, no masses, no thyromegaly Respiratory: clear to auscultation bilaterally, no wheezing, no crackles. Normal respiratory effort. No accessory muscle use.  Cardiovascular: Sinus tachycardia, no murmurs / rubs / gallops. No extremity edema. 2+ pedal pulses. No carotid bruits.  Abdomen: no tenderness, no masses palpated. No hepatosplenomegaly. Bowel sounds positive.  Musculoskeletal: no clubbing / cyanosis. No joint deformity upper and lower extremities. Good ROM, no contractures.  Marked muscle wasting.  Skin: no rashes, lesions, ulcers. No induration Neurologic: CN 2-12 grossly intact. Sensation intact, DTR normal. Strength 5/5 in all 4.  Psychiatric: Normal judgment and insight. Alert and oriented x 3. Normal mood.     Labs on Admission: I have personally reviewed following labs and imaging studies  CBC: Recent Labs  Lab 01/09/20 1300 01/15/20 1318  WBC 8.7 8.8  NEUTROABS 7.4 6.1  HGB 7.3* 9.7*  HCT 23.0* 29.5*  MCV 91.6 90.5  PLT 210 950   Basic Metabolic Panel: Recent Labs  Lab 01/09/20 1300 01/15/20 1318  NA 139 136  K 4.4 4.1  CL 104 101  CO2 36* 25  GLUCOSE 111* 115*  BUN 23* 20  CREATININE 0.68 0.74  CALCIUM 9.3 9.1   GFR: Estimated  Creatinine Clearance: 83.6 mL/min (by C-G formula based on SCr of 0.74 mg/dL). Liver Function Tests: Recent Labs  Lab 01/09/20 1300 01/15/20 1318  AST 148* 200*  ALT 171* 273*  ALKPHOS 396* 468*  BILITOT 1.2 1.6*  PROT 8.0 8.6*  ALBUMIN 2.5* 2.6*   No results for input(s): LIPASE, AMYLASE in the last 168 hours. No results for input(s): AMMONIA in the last 168 hours. Coagulation Profile: No results for input(s): INR, PROTIME in the last 168 hours. Cardiac Enzymes: No results for input(s): CKTOTAL, CKMB, CKMBINDEX, TROPONINI in the last 168 hours. BNP (last 3 results) No results for input(s): PROBNP in the last 8760 hours. HbA1C: No results for input(s): HGBA1C in the last 72 hours. CBG: No results for input(s): GLUCAP in the last 168 hours. Lipid Profile: No results for input(s): CHOL, HDL, LDLCALC, TRIG, CHOLHDL, LDLDIRECT in the last 72 hours. Thyroid Function Tests: No results for input(s): TSH, T4TOTAL, FREET4, T3FREE, THYROIDAB in the last 72 hours. Anemia Panel: No results for input(s): VITAMINB12, FOLATE, FERRITIN, TIBC, IRON, RETICCTPCT in the last 72 hours. Urine analysis:    Component Value Date/Time   COLORURINE YELLOW 11/24/2019 1832   APPEARANCEUR CLEAR 11/24/2019 1832   LABSPEC 1.025 11/24/2019 1832   PHURINE 7.0 11/24/2019 1832   GLUCOSEU NEGATIVE 11/24/2019 1832   HGBUR NEGATIVE 11/24/2019 1832   BILIRUBINUR NEGATIVE 11/24/2019 1832   KETONESUR 20 (A) 11/24/2019 1832   PROTEINUR NEGATIVE 11/24/2019 1832   NITRITE  NEGATIVE 11/24/2019 1832   LEUKOCYTESUR MODERATE (A) 11/24/2019 1832   Sepsis Labs: @LABRCNTIP (procalcitonin:4,lacticidven:4) )No results found for this or any previous visit (from the past 240 hour(s)).   Radiological Exams on Admission: US Abdomen Complete  Result Date: 01/15/2020 CLINICAL DATA:  Acetic pancreatic cancer to the liver. New hyperbilirubinemia. Rule out biliary obstruction EXAM: ABDOMEN ULTRASOUND COMPLETE COMPARISON:   Ultrasound abdomen 10/30/2019 FINDINGS: Gallbladder: Sludge versus tiny gallstones within the gallbladder lumen. No sonographic Murphy sign noted by sonographer. Common bile duct: Diameter: 6 mm Liver: Innumerable hypodense solid hepatic masses within bilateral hepatic lobes with the largest measuring 2.9 x 2.6 x 3 cm. Remainder of the hepatic demonstrates normal parenchymal echogenicity. Portal vein is patent on color Doppler imaging with normal direction of blood flow towards the liver. IVC: No abnormality visualized. Pancreas: Not visualized due to gaseous abdomen. Spleen: Size and appearance within normal limits. Right Kidney: Length: 11.9 cm. Echogenicity within normal limits. No mass or hydronephrosis visualized. Left Kidney: Length: 11.8 cm. Echogenicity within normal limits. No mass or hydronephrosis visualized. Abdominal aorta: No aneurysm visualized. Other findings: None. IMPRESSION: 1. Gallbladder sludge versus cholelithiasis with no associated acute cholecystitis or choledocholithiasis. 2. Innumerable hepatic metastases. Electronically Signed   By: Iven Finn M.D.   On: 01/15/2020 17:36      Assessment/Plan Principal Problem:   Biliary obstruction Active Problems:   Essential hypertension   Pancreatic cancer (HCC)   Nausea and vomiting in adult   Anemia associated with chemotherapy     #1 cholelithiasis with suspected biliary obstruction: Ultrasound did not show any evidence of obstruction.  May require MRCP or CT abdomen pelvis.  Patient will be admitted and consultation with oncology in the morning.  She might need GI with ERCP.  [facility] has been consulted by the ER apparently.  Will follow LFTs.  #2 elevated liver function test: May be related to have numerous hepatic metastases.  Defer to GI and oncology as above.  #3 metastatic pancreatic cancer: Defer to oncology for treatment.  Still getting chemo.  #4 generalized cachexia with protein calorie malnutrition: Most likely  secondary to malignancy.  Continue treatment  #5 low-grade fever: We will check UA.  No fever.  May be malignancy related.   DVT prophylaxis: SCD Code Status: Full code Family Communication: No family at bedside Disposition Plan: Home Consults called: Consult Dr. Irene Limbo, oncology in the morning Admission status: Inpatient  Severity of Illness: The appropriate patient status for this patient is INPATIENT. Inpatient status is judged to be reasonable and necessary in order to provide the required intensity of service to ensure the patient's safety. The patient's presenting symptoms, physical exam findings, and initial radiographic and laboratory data in the context of their chronic comorbidities is felt to place them at high risk for further clinical deterioration. Furthermore, it is not anticipated that the patient will be medically stable for discharge from the hospital within 2 midnights of admission. The following factors support the patient status of inpatient.   " The patient's presenting symptoms include abdominal pain and worsening liver function tests. " The worrisome physical exam findings include cachexia. " The initial radiographic and laboratory data are worrisome because of elevated LFTs. " The chronic co-morbidities include pancreatic cancer.   * I certify that at the point of admission it is my clinical judgment that the patient will require inpatient hospital care spanning beyond 2 midnights from the point of admission due to high intensity of service, high risk for further deterioration  and high frequency of surveillance required.Barbette Merino MD [facility] Pager [phone]  If 7PM-7AM, please contact night-coverage [email or web address] Password [facility]  01/15/2020, 6:46 PM

## 2020-01-15 NOTE — ED Notes (Signed)
Attempted to call report, no answer

## 2020-01-15 NOTE — Patient Instructions (Signed)

## 2020-01-15 NOTE — Progress Notes (Signed)
Per Regan Rakers, NP, patient transported to [facility].

## 2020-01-15 NOTE — Telephone Encounter (Signed)
Nutrition Note  RD attempted to contact patient for nutrition follow-up via phone today. Patient did not answer, call back number with request for return call left on voicemail.   Lajuan Lines, RD, LDN Clinical Nutrition After Hours/Weekend Pager # in [place]

## 2020-01-16 ENCOUNTER — Inpatient Hospital Stay (HOSPITAL_COMMUNITY): Payer: BC Managed Care – PPO

## 2020-01-16 ENCOUNTER — Encounter (HOSPITAL_COMMUNITY): Payer: Self-pay | Admitting: Internal Medicine

## 2020-01-16 DIAGNOSIS — K831 Obstruction of bile duct: Secondary | ICD-10-CM

## 2020-01-16 LAB — COMPREHENSIVE METABOLIC PANEL
ALT: 231 U/L — ABNORMAL HIGH (ref 0–44)
AST: 197 U/L — ABNORMAL HIGH (ref 15–41)
Albumin: 2.8 g/dL — ABNORMAL LOW (ref 3.5–5.0)
Alkaline Phosphatase: 377 U/L — ABNORMAL HIGH (ref 38–126)
Anion gap: 9 (ref 5–15)
BUN: 14 mg/dL (ref 6–20)
CO2: 23 mmol/L (ref 22–32)
Calcium: 8.8 mg/dL — ABNORMAL LOW (ref 8.9–10.3)
Chloride: 102 mmol/L (ref 98–111)
Creatinine, Ser: 0.66 mg/dL (ref 0.44–1.00)
GFR, Estimated: >60 mL/min (ref >60)
Glucose, Bld: 97 mg/dL (ref 70–99)
Potassium: 4 mmol/L (ref 3.5–5.1)
Sodium: 134 mmol/L — ABNORMAL LOW (ref 135–145)
Total Bilirubin: 1.7 mg/dL — ABNORMAL HIGH (ref 0.3–1.2)
Total Protein: 7.7 g/dL (ref 6.5–8.1)

## 2020-01-16 LAB — CBC
HCT: 29.4 % — ABNORMAL LOW (ref 36.0–46.0)
Hemoglobin: 9.3 g/dL — ABNORMAL LOW (ref 12.0–15.0)
MCH: 30.1 pg (ref 26.0–34.0)
MCHC: 31.6 g/dL (ref 30.0–36.0)
MCV: 95.1 fL (ref 80.0–100.0)
Platelets: 201 10*3/uL (ref 150–400)
RBC: 3.09 MIL/uL — ABNORMAL LOW (ref 3.87–5.11)
RDW: 16.4 % — ABNORMAL HIGH (ref 11.5–15.5)
WBC: 8.9 10*3/uL (ref 4.0–10.5)
nRBC: 0 % (ref 0.0–0.2)

## 2020-01-16 MED ORDER — GADOBUTROL 1 MMOL/ML IV SOLN
8.0000 mL | Freq: Once | INTRAVENOUS | Status: AC | PRN
  Administered 2020-01-16: 8 mL via INTRAVENOUS

## 2020-01-16 MED ORDER — PIPERACILLIN-TAZOBACTAM 3.375 G IVPB
3.3750 g | Freq: Three times a day (TID) | INTRAVENOUS | Status: DC
  Administered 2020-01-16 – 2020-01-17 (×4): 3.375 g via INTRAVENOUS
  Filled 2020-01-16 (×4): qty 50

## 2020-01-16 MED ORDER — METOPROLOL TARTRATE 5 MG/5ML IV SOLN
5.0000 mg | INTRAVENOUS | Status: DC | PRN
  Administered 2020-01-16: 5 mg via INTRAVENOUS
  Filled 2020-01-16: qty 5

## 2020-01-16 NOTE — Consult Note (Addendum)
Referring Provider:  [place] Primary Care Physician:  Josetta Huddle, MD Primary Gastroenterologist:  [facility] Primary   Reason for Consultation: Abnormal LFTs, patient with known metastatic cancer  HPI: Cynthia Frye is a 59 y.o. female Cynthia Frye is a 59 y.o. female with past medical history of metastatic pancreatic adenocarcinoma with mets to liver diagnosed in August 2021 ,on chemotherapy admitted to the hospital for worsening LFTs.  She was seen in oncology clinic yesterday and was found to have worsening LFTs and she was advised to have inpatient admission for further evaluation and to rule out biliary obstruction.  Patient seen and examined at bedside.  She is complaining of intermittent nausea and vomiting.  Complaining of lower abdominal discomfort which gets better with bowel movement.  Denies any blood in the stool or black stool.   CT chest on January 11, 2020 showed numerous small pulmonary emboli as well as widespread worsening hepatic metastatic disease.  Blood work today showed T bili of 1.7, alkaline phosphatase 377, ALT 231 and AST 197.  Normal lipase.  Normal white count.  Stable hemoglobin at 9.3.  Normal platelet counts.     Past Medical History:  Diagnosis Date  . Allergies    peanuts, corn, beans, red grapefruit, naval oranges  . Asthma    allergy shots and medication  . Cancer [facility])    pancreatic  . Collagen vascular disease ([place])   . DDD (degenerative disc disease), lumbar   . Eustachian tube dysfunction 12/2012   rhinitis, vertigo- Dr. Wilburn Cornelia, ENT   . Fibroids   . Heart murmur    Echo 1/18: EF 55-60, no RWMA, normal diastolic function, trivial AI, PASP 32  . History of cardiac catheterization    LHC 3/18: normal coronary arteries.   . History of nuclear stress test    ETT-Myoview 2/18: EF 62, + ECG response; apical and distal septal ischemia; intermediate risk.  Marland Kitchen Hypertension   . Iron deficiency anemia   . Lupus [facility]) 2011   Dr. Trudie Reed  .  Migraine headache    trial of generic maxalt 10 mg, January 2021  . Obesity   . Seasonal allergies   . Seizure in childhood [facility])    as a child no treatment none x 30 years    Past Surgical History:  Procedure Laterality Date  . ABDOMINAL HYSTERECTOMY    . BACK SURGERY  2009  . BIOPSY  12/10/2019   Procedure: BIOPSY;  Surgeon: Wilford Corner, MD;  Location: [facility];  Service: Endoscopy;;  . COLONOSCOPY WITH PROPOFOL N/A 04/11/2012   Procedure: COLONOSCOPY WITH PROPOFOL;  Surgeon: Garlan Fair, MD;  Location: [facility];  Service: Endoscopy;  Laterality: N/A;  . ESOPHAGOGASTRODUODENOSCOPY (EGD) WITH PROPOFOL N/A 12/10/2019   Procedure: ESOPHAGOGASTRODUODENOSCOPY (EGD) WITH PROPOFOL;  Surgeon: Wilford Corner, MD;  Location: [facility];  Service: Endoscopy;  Laterality: N/A;  . HERNIA REPAIR     as child unbilical  . 0000000 partial discectomy and laminectomy     Dr. Christella Noa  . LEFT HEART CATH AND CORONARY ANGIOGRAPHY N/A 04/09/2016   Procedure: Left Heart Cath and Coronary Angiography;  Surgeon: Nelva Bush, MD;  Location: [place] [facility];  Service: Cardiovascular;  Laterality: N/A;  . MOUTH SURGERY    . PORTACATH PLACEMENT N/A 10/11/2019   Procedure: INSERTION PORT-A-CATH LEFT SUBCLAVIAN;  Surgeon: [name], MD;  Location: [place];  Service: General;  Laterality: N/A;    Prior to Admission medications   Medication Sig Start  Date End Date Taking? Authorizing Provider  Cholecalciferol (VITAMIN D) 2000 units CAPS Take 2,000 Units by mouth daily.    Yes [provider]  dexamethasone (DECADRON) 4 MG tablet Take 1 tablet (4 mg total) by mouth daily. Take 1 tablet once daily for 3 to 5 days after chemo 10/29/19  Yes Alla Feeling, NP  diclofenac (VOLTAREN) 75 MG EC tablet Take 1 tablet (75 mg total) by mouth 2 (two) times daily as needed. Patient taking differently: Take 75 mg by mouth 2 (two) times daily. 12/12/19  Yes Shelly Coss,  MD  Dietary Management Product (RHEUMATE) CAPS Take 1 capsule by mouth daily.    Yes [provider]  diphenoxylate-atropine (LOMOTIL) 2.5-0.025 MG tablet Take 2 tablets by mouth 4 (four) times daily as needed for diarrhea or loose stools (use 2nd, if Imodium doesn't work. Max dose: 8 tablets/day.). Patient taking differently: Take 2 tablets by mouth 4 (four) times daily as needed for diarrhea or loose stools. 11/09/19  Yes Orson Slick, MD  dronabinol (MARINOL) 2.5 MG capsule Take 1 capsule (2.5 mg total) by mouth 2 (two) times daily before a meal. Patient taking differently: Take 2.5 mg by mouth 2 (two) times daily as needed (nausea). 11/14/19  Yes Truitt Merle, MD  enoxaparin (LOVENOX) 80 MG/0.8ML injection Inject 0.8 mLs (80 mg total) into the skin every 12 (twelve) hours. 01/09/20 04/08/20 Yes Alla Feeling, NP  ferrous sulfate 325 (65 FE) MG tablet Take 325 mg by mouth daily with breakfast.   Yes [provider]  fluticasone (FLONASE) 50 MCG/ACT nasal spray Place 2 sprays into both nostrils daily. Patient taking differently: Place 2 sprays into both nostrils daily as needed for allergies. 04/29/16  Yes Jeffery, Domingo Mend, PA  hydroxychloroquine (PLAQUENIL) 200 MG tablet Take 400 mg by mouth daily.  06/17/14  Yes [provider]  loperamide (IMODIUM) 2 MG capsule Take 2 capsules (4 mg total) by mouth 2 (two) times daily as needed for diarrhea or loose stools (Use 1st). 11/09/19  Yes Hongalgi, Lenis Dickinson, MD  LORazepam (ATIVAN) 0.5 MG tablet Take 1 tablet (0.5 mg total) by mouth every 8 (eight) hours as needed for anxiety (and anticiaptory n/v. DO NOT TAKE WITH XANAX). 11/21/19  Yes Alla Feeling, NP  metoprolol succinate (TOPROL-XL) 50 MG 24 hr tablet Take 50 mg by mouth daily.    Yes [provider]  mirtazapine (REMERON) 7.5 MG tablet Take 1 tablet (7.5 mg total) by mouth at bedtime. 01/08/20  Yes Alla Feeling, NP  OLANZapine (ZYPREXA) 7.5 MG tablet Take 1  tablet (7.5 mg total) by mouth at bedtime. 12/12/19  Yes Shelly Coss, MD  ondansetron (ZOFRAN) 8 MG tablet Take 1 tablet (8 mg total) by mouth every 8 (eight) hours as needed for nausea or vomiting. 12/24/19  Yes Truitt Merle, MD  oxyCODONE (OXY IR/ROXICODONE) 5 MG immediate release tablet Take 1 tablet (5 mg total) by mouth every 6 (six) hours as needed for severe pain. 12/18/19  Yes Truitt Merle, MD  senna-docusate (SENOKOT-S) 8.6-50 MG tablet Take 1 tablet by mouth 2 (two) times daily. Patient taking differently: Take 1 tablet by mouth 2 (two) times daily as needed for mild constipation. 12/12/19  Yes Shelly Coss, MD  sucralfate (CARAFATE) 1 GM/10ML suspension Take 10 mLs (1 g total) by mouth 4 (four) times daily -  with meals and at bedtime. 12/12/19  Yes Shelly Coss, MD  topiramate (TOPAMAX) 50 MG tablet Take 1 tablet (  50 mg total) by mouth at bedtime. Patient taking differently: Take 50 mg by mouth daily as needed (migraines). 11/09/19  Yes Hongalgi, Lenis Dickinson, MD  feeding supplement (ENSURE ENLIVE / ENSURE PLUS) LIQD Take 237 mLs by mouth 2 (two) times daily between meals. Patient not taking: Reported on 01/15/2020 11/09/19   Modena Jansky, MD  lipase/protease/amylase [phone] units CPEP Take 1 capsule (24,000 Units total) by mouth 3 (three) times daily with meals. 12/12/19   Shelly Coss, MD  polyethylene glycol (MIRALAX / GLYCOLAX) 17 g packet Take 17 g by mouth daily. Patient not taking: Reported on 01/15/2020 12/13/19   Shelly Coss, MD  potassium chloride 20 MEQ/15ML (10%) SOLN Take 15 mLs (20 mEq total) by mouth 2 (two) times daily. Patient not taking: Reported on 01/15/2020 11/21/19   Alla Feeling, NP  prochlorperazine (COMPAZINE) 10 MG tablet Take 1 tablet (10 mg total) by mouth every 6 (six) hours as needed (Nausea or vomiting). Patient taking differently: Take 10 mg by mouth every 6 (six) hours as needed for nausea or vomiting.  10/18/19 12/18/19  Truitt Merle, MD     Scheduled Meds: . cholecalciferol  2,000 Units Oral Daily  . dexamethasone  4 mg Oral Daily  . diclofenac  75 mg Oral BID  . enoxaparin  80 mg Subcutaneous BID  . ferrous sulfate  325 mg Oral Q breakfast  . folic acid-pyridoxine-cyancobalamin  1 tablet Oral Daily  . hydroxychloroquine  400 mg Oral Daily  . lipase/protease/amylase  24,000 Units Oral TID WC  . metoprolol succinate  50 mg Oral Daily  . mirtazapine  7.5 mg Oral QHS  . OLANZapine  7.5 mg Oral QHS  . sucralfate  1 g Oral TID WC & HS   Continuous Infusions: . dextrose 5% lactated ringers 100 mL/hr at 01/15/20 2156  . piperacillin-tazobactam (ZOSYN)  IV 3.375 g (01/16/20 0958)   PRN Meds:.acetaminophen, diphenoxylate-atropine, dronabinol, fluticasone, HYDROmorphone (DILAUDID) injection, loperamide, LORazepam, metoprolol tartrate, ondansetron, oxyCODONE, promethazine, senna-docusate, topiramate  Allergies as of 01/15/2020 - Review Complete 01/15/2020  Allergen Reaction Noted  . Cinnamon Other (See Comments) 04/07/2016  . Peanut-containing drug products Hives 04/07/2016  . Prednisone Other (See Comments) 10/28/2019  . Corn oil Hives 04/07/2016  . Corn-containing products Hives 04/07/2016  . Other Rash 04/07/2016    Family History  Problem Relation Age of Onset  . Asthma Mother   . Diabetes Mother   . Hypertension Mother   . Heart attack Father 39  . CAD Father   . Stroke Maternal Aunt   . Stroke Paternal Aunt   . Stroke Paternal Uncle   . Gout Other        uncles   . Cancer Niece        paternal half sister's daughter; unknown type; unknown age diagnosed  . Ovarian cancer Neg Hx   . Breast cancer Neg Hx   . Colon cancer Neg Hx   . Migraines Neg Hx     Social History   Socioeconomic History  . Marital status: Married    Spouse name: Not on file  . Number of children: 0  . Years of education: Not on file  . Highest education level: Some college, no degree  Occupational History  . Occupation: Medical laboratory scientific officer   Tobacco Use  . Smoking status: Never Smoker  . Smokeless tobacco: Never Used  Vaping Use  . Vaping Use: Never used  Substance and Sexual Activity  . Alcohol use: No  .  Drug use: No  . Sexual activity: Not on file  Other Topics Concern  . 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  Physical Exam: Vital signs: Vitals:   01/16/20 0912 01/16/20 1006  BP: 130/74 120/86  Pulse: (!) 120 (!) 113  Resp: 16 16  Temp: 98.3 F (36.8 C) 98 F (36.7 C)  SpO2: 100% 100%   Last BM Date: 01/14/20 Physical Exam Vitals and nursing note reviewed.  Constitutional:      General: She is not in acute distress.    Appearance: Normal appearance.  HENT:     Head: Normocephalic and atraumatic.     Nose: Nose normal.     Mouth/Throat:     Mouth: Mucous membranes are moist.  Eyes:     General: No scleral icterus.    Extraocular Movements: Extraocular movements intact.  Cardiovascular:     Rate and Rhythm: Normal rate and regular rhythm.     Heart sounds: No murmur heard.   Pulmonary:     Effort: Pulmonary effort is normal. No respiratory distress.     Comments: Ant exam only  Abdominal:     General: Bowel sounds are normal. There is no distension.     Palpations: Abdomen is soft.     Tenderness: There is no abdominal tenderness. There is no guarding.  Musculoskeletal:        General: No deformity or signs of injury.  Normal range of motion.     Cervical back: Normal range of motion and neck supple.  Skin:    General: Skin is warm.     Coloration: Skin is not jaundiced.  Neurological:     Mental Status: She is alert and oriented to person, place, and time.  Psychiatric:        Behavior: Behavior normal.        Thought Content: Thought content normal.        Judgment: Judgment normal.     Comments: Anxious     GI:  Lab Results: Recent Labs    01/15/20 1318 01/15/20 2043 01/16/20 0541  WBC 8.8 8.1 8.9  HGB 9.7* 8.4* 9.3*  HCT 29.5* 26.4* 29.4*  PLT 242 189 201   BMET Recent Labs    01/15/20 1318 01/15/20 2228 01/16/20 0541  NA 136  --  134*  K 4.1  --  4.0  CL 101  --  102  CO2 25  --  23  GLUCOSE 115*  --  97  BUN 20  --  14  CREATININE 0.74 0.57 0.66  CALCIUM 9.1  --  8.8*   LFT Recent Labs    01/16/20 0541  PROT 7.7  ALBUMIN 2.8*  AST 197*  ALT 231*  ALKPHOS 377*  BILITOT 1.7*   PT/INR No results for input(s): LABPROT, INR in the  last 72 hours.   Studies/Results: US Abdomen Complete  Result Date: 01/15/2020 CLINICAL DATA:  Acetic pancreatic cancer to the liver. New hyperbilirubinemia. Rule out biliary obstruction EXAM: ABDOMEN ULTRASOUND COMPLETE COMPARISON:  Ultrasound abdomen 10/30/2019 FINDINGS: Gallbladder: Sludge versus tiny gallstones within the gallbladder lumen. No sonographic Murphy sign noted by sonographer. Common bile duct: Diameter: 6 mm Liver: Innumerable hypodense solid hepatic masses within bilateral hepatic lobes with the largest measuring 2.9 x 2.6 x 3 cm. Remainder of the hepatic demonstrates normal parenchymal echogenicity. Portal vein is patent on color Doppler imaging with normal direction of blood flow towards the liver. IVC: No abnormality visualized. Pancreas: Not visualized due to gaseous abdomen. Spleen: Size and appearance within normal limits. Right Kidney: Length: 11.9 cm. Echogenicity within normal limits. No mass or hydronephrosis  visualized. Left Kidney: Length: 11.8 cm. Echogenicity within normal limits. No mass or hydronephrosis visualized. Abdominal aorta: No aneurysm visualized. Other findings: None. IMPRESSION: 1. Gallbladder sludge versus cholelithiasis with no associated acute cholecystitis or choledocholithiasis. 2. Innumerable hepatic metastases. Electronically Signed   By: Iven Finn M.D.   On: 01/15/2020 17:36    Impression/Plan: -Abnormal LFTs and patient with metastatic pancreatic cancer with worsening liver mets.  Most likely from increasing tumor burden in the liver.T bili of 1.7, alkaline phosphatase 377, ALT 231 and AST 197.  Normal lipase.  Normal white count.  Stable hemoglobin at 9.3.  Normal platelet counts. -Nausea, vomiting and intermittent dysphagia.  EGD December 10, 2019 showed 4 mm distal esophageal ulcer, duodenitis and gastritis.  Biopsies were negative for H. pylori and malignancy. -Pulmonary embolism.  On full dose Lovenox  Recommendations ------------------------- -Get MRI MRCP to rule out biliary obstruction .  Abnormal LFTs most likely from worsening tumor burden in the liver. -Start Protonix for ongoing nausea and vomiting with known esophageal ulcer.  Patient does have a QT prolongation and recommend to keep patient on telemetry. DW/ Dr. Maylene Roes -Continue Carafate -Repeat labs in the morning. -GI will follow   LOS: 1 day   Otis Brace  MD, FACP 01/16/2020, 10:18 AM  Contact #  [phone]

## 2020-01-16 NOTE — Progress Notes (Signed)
Rapid Response Event Note   Reason for Call : Pt has HR in 140's, and fever   Initial Focused Assessment: pt o/a and f/c, RA sats wnl.  Interventions: Placed on monitor, (ST)  assessed NP at the bedside new orders received and initiated. See orders and see flow sheet for VS.  Plan of Care: Pt will remain in current location per NP.  Pt nurse informed to call if any more issues develop.   Dyann Ruddle, RN

## 2020-01-16 NOTE — Progress Notes (Signed)
Patient on RED MEWS CN made aware. Notified RRN and provider on call M. Sharlet Salina, [place] With new orders in and carried out,both RRN and NP came and check on the patient. We will continue to monitor.

## 2020-01-16 NOTE — Progress Notes (Signed)
Pt was brought down for MRI but, unfortunately the IV port she has placed we are unable to to power inject into it. We placed a IV consult but, we are unable to hold the table due to other STAT exams. Once new IV port is placed or a IV we will bring her back down.

## 2020-01-16 NOTE — Progress Notes (Signed)
PROGRESS NOTE    BURNETT WOMAC  [id number] DOB: 1960/04/28 DOA: 01/15/2020 PCP: Josetta Huddle, MD     Brief Narrative:  Cynthia Frye is a 59 y.o. female with medical history significant of pancreatic cancer currently on chemotherapy, asthma, degenerative disc disease, hypertension, lupus, migraine headaches who was supposed to have chemotherapy on 12/21 and went to the cancer center.  She has been having persistent nausea, vomiting, and constant abdominal pain.  Patient has been on chronic antiemetics.  She was seen pre-chemo and found to have worsening liver function test.  There was concern for biliary obstruction.  Her pain was rated as 6 out of 10.  No significant change from her previous level of pain.  Due to worsening liver function however recommendation was to admit the patient for evaluation of biliary obstruction with GI evaluation.  Patient noted to have fever with temperature up to 101.  No other symptoms.  She is laying still in bed.  New events last 24 hours / Subjective: She denies any further nausea or vomiting since admission.  Had some lower abdominal pain this morning.  We briefly discussed my recommendation for palliative care medicine consultation to initiate goals of care conversation and patient became very tearful and overwhelmed.  Assessment & Plan:   Principal Problem:   Biliary obstruction Active Problems:   Essential hypertension   Pancreatic cancer (HCC)   Nausea and vomiting in adult   Anemia associated with chemotherapy   Metastatic pancreatic cancer with concern for biliary obstruction, elevated LFTs -Ultrasound abdomen: Gallbladder sludge versus cholelithiasis with no associated acute cholecystitis or choledocholithiasis -MRCP ordered -GI consulted -Follows with Dr. Burr Medico.  Will notify oncology of patient's admission -IV fluid -Continue to trend LFT  SIRS -Presented with tachycardia heart rate 130, fever T-max 102.7, tachypnea respiratory  rate 21 -Blood cultures pending -Influenza, Covid negative -UA pending  -Concern for intraabdominal infection. Zosyn started empirically   Recent diagnosis of DVT -Continue Lovenox  Goals of care -Consult palliative care medicine  Lupus -Continue Plaquenil  Essential hypertension -Continue Toprol.  Patient's HCTZ, losartan were recently discontinued after hospitalization in October 2021   DVT prophylaxis: Lovenox   Code Status: Full code Family Communication: No family at bedside Disposition Plan:  Status is: Inpatient  Remains inpatient appropriate because:Inpatient level of care appropriate due to severity of illness   Dispo: The patient is from: Home              Anticipated d/c is to: Home              Anticipated d/c date is: 3 days              Patient currently is not medically stable to d/c.  Work-up pending, GI consulted.  On IV antibiotic.      Consultants:   GI  Oncology  Palliative care medicine  Procedures:   None  Antimicrobials:  Anti-infectives (From admission, onward)   Start     Dose/Rate Route Frequency Ordered Stop   01/16/20 1000  hydroxychloroquine (PLAQUENIL) tablet 400 mg        400 mg Oral Daily 01/15/20 2156     01/16/20 0900  piperacillin-tazobactam (ZOSYN) IVPB 3.375 g        3.375 g 12.5 mL/hr over 240 Minutes Intravenous Every 8 hours 01/16/20 0738          Objective: Vitals:   01/16/20 0637 01/16/20 0810 01/16/20 0912 01/16/20 1006  BP: 122/68 114/70 130/74 120/86  Pulse: (!) 114 (!) 114 (!) 120 (!) 113  Resp: 14 14 16 16   Temp: (!) 100.8 F (38.2 C) 98.4 F (36.9 C) 98.3 F (36.8 C) 98 F (36.7 C)  TempSrc: Oral Oral Oral Oral  SpO2: 96% 98% 100% 100%    Intake/Output Summary (Last 24 hours) at 01/16/2020 1015 Last data filed at 01/16/2020 0600 Gross per 24 hour  Intake 1822.79 ml  Output --  Net 1822.79 ml   There were no vitals filed for this visit.  Examination:  General exam: Appears calm and  comfortable  Respiratory system: Clear to auscultation. Respiratory effort normal. No respiratory distress. No conversational dyspnea.  Cardiovascular system: S1 & S2 heard, RRR. No murmurs. No pedal edema. Gastrointestinal system: Abdomen is nondistended, soft and nontender to palpation. Normal bowel sounds heard. Central nervous system: Alert and oriented. No focal neurological deficits. Speech clear.  Extremities: Symmetric in appearance  Skin: No rashes, lesions or ulcers on exposed skin  Psychiatry: Judgement and insight appear normal. Mood & affect appropriate..  Tearful.  Data Reviewed: I have personally reviewed following labs and imaging studies  CBC: Recent Labs  Lab 01/09/20 1300 01/15/20 1318 01/15/20 2043 01/16/20 0541  WBC 8.7 8.8 8.1 8.9  NEUTROABS 7.4 6.1  --   --   HGB 7.3* 9.7* 8.4* 9.3*  HCT 23.0* 29.5* 26.4* 29.4*  MCV 91.6 90.5 93.6 95.1  PLT 210 242 189 [id number]   Basic Metabolic Panel: Recent Labs  Lab 01/09/20 1300 01/15/20 1318 01/15/20 2228 01/16/20 0541  NA 139 136  --  134*  K 4.4 4.1  --  4.0  CL 104 101  --  102  CO2 36* 25  --  23  GLUCOSE 111* 115*  --  97  BUN 23* 20  --  14  CREATININE 0.68 0.74 0.57 0.66  CALCIUM 9.3 9.1  --  8.8*   GFR: Estimated Creatinine Clearance: 83.6 mL/min (by C-G formula based on SCr of 0.66 mg/dL). Liver Function Tests: Recent Labs  Lab 01/09/20 1300 01/15/20 1318 01/16/20 0541  AST 148* 200* 197*  ALT 171* 273* 231*  ALKPHOS 396* 468* 377*  BILITOT 1.2 1.6* 1.7*  PROT 8.0 8.6* 7.7  ALBUMIN 2.5* 2.6* 2.8*   Recent Labs  Lab 01/15/20 2228  LIPASE 21   No results for input(s): AMMONIA in the last 168 hours. Coagulation Profile: No results for input(s): INR, PROTIME in the last 168 hours. Cardiac Enzymes: No results for input(s): CKTOTAL, CKMB, CKMBINDEX, TROPONINI in the last 168 hours. BNP (last 3 results) No results for input(s): PROBNP in the last 8760 hours. HbA1C: No results for input(s):  HGBA1C in the last 72 hours. CBG: No results for input(s): GLUCAP in the last 168 hours. Lipid Profile: No results for input(s): CHOL, HDL, LDLCALC, TRIG, CHOLHDL, LDLDIRECT in the last 72 hours. Thyroid Function Tests: No results for input(s): TSH, T4TOTAL, FREET4, T3FREE, THYROIDAB in the last 72 hours. Anemia Panel: No results for input(s): VITAMINB12, FOLATE, FERRITIN, TIBC, IRON, RETICCTPCT in the last 72 hours. Sepsis Labs: Recent Labs  Lab 01/15/20 1800 01/15/20 2043  LATICACIDVEN 1.3 1.1    Recent Results (from the past 240 hour(s))  Resp Panel by RT-PCR (Flu A&B, Covid) Nasopharyngeal Swab     Status: None   Collection Time: 01/15/20  5:42 PM   Specimen: Nasopharyngeal Swab; Nasopharyngeal(NP) swabs in vial transport medium  Result Value Ref Range Status   SARS Coronavirus 2 by RT PCR NEGATIVE NEGATIVE Final  Comment: (NOTE) SARS-CoV-2 target nucleic acids are NOT DETECTED.  The SARS-CoV-2 RNA is generally detectable in upper respiratory specimens during the acute phase of infection. The lowest concentration of SARS-CoV-2 viral copies this assay can detect is 138 copies/mL. A negative result does not preclude SARS-Cov-2 infection and should not be used as the sole basis for treatment or other patient management decisions. A negative result may occur with  improper specimen collection/handling, submission of specimen other than nasopharyngeal swab, presence of viral mutation(s) within the areas targeted by this assay, and inadequate number of viral copies(<138 copies/mL). A negative result must be combined with clinical observations, patient history, and epidemiological information. The expected result is Negative.  Fact Sheet for Patients:  EntrepreneurPulse.com.au  Fact Sheet for Healthcare Providers:  IncredibleEmployment.be  This test is no t yet approved or cleared by the [place] FDA and  has been authorized for  detection and/or diagnosis of SARS-CoV-2 by FDA under an Emergency Use Authorization (EUA). This EUA will remain  in effect (meaning this test can be used) for the duration of the COVID-19 declaration under Section 564(b)(1) of the Act, 21 U.S.C.section 360bbb-3(b)(1), unless the authorization is terminated  or revoked sooner.       Influenza A by PCR NEGATIVE NEGATIVE Final   Influenza B by PCR NEGATIVE NEGATIVE Final    Comment: (NOTE) The Xpert Xpress SARS-CoV-2/FLU/RSV plus assay is intended as an aid in the diagnosis of influenza from Nasopharyngeal swab specimens and should not be used as a sole basis for treatment. Nasal washings and aspirates are unacceptable for Xpert Xpress SARS-CoV-2/FLU/RSV testing.  Fact Sheet for Patients: EntrepreneurPulse.com.au  Fact Sheet for Healthcare Providers: IncredibleEmployment.be  This test is not yet approved or cleared by the [place] FDA and has been authorized for detection and/or diagnosis of SARS-CoV-2 by FDA under an Emergency Use Authorization (EUA). This EUA will remain in effect (meaning this test can be used) for the duration of the COVID-19 declaration under Section 564(b)(1) of the Act, 21 U.S.C. section 360bbb-3(b)(1), unless the authorization is terminated or revoked.  Performed at [facility], [place] [address], [place], [place] [address]       Radiology Studies: US Abdomen Complete  Result Date: 01/15/2020 CLINICAL DATA:  Acetic pancreatic cancer to the liver. New hyperbilirubinemia. Rule out biliary obstruction EXAM: ABDOMEN ULTRASOUND COMPLETE COMPARISON:  Ultrasound abdomen 10/30/2019 FINDINGS: Gallbladder: Sludge versus tiny gallstones within the gallbladder lumen. No sonographic Murphy sign noted by sonographer. Common bile duct: Diameter: 6 mm Liver: Innumerable hypodense solid hepatic masses within bilateral hepatic lobes with the largest measuring 2.9  x 2.6 x 3 cm. Remainder of the hepatic demonstrates normal parenchymal echogenicity. Portal vein is patent on color Doppler imaging with normal direction of blood flow towards the liver. IVC: No abnormality visualized. Pancreas: Not visualized due to gaseous abdomen. Spleen: Size and appearance within normal limits. Right Kidney: Length: 11.9 cm. Echogenicity within normal limits. No mass or hydronephrosis visualized. Left Kidney: Length: 11.8 cm. Echogenicity within normal limits. No mass or hydronephrosis visualized. Abdominal aorta: No aneurysm visualized. Other findings: None. IMPRESSION: 1. Gallbladder sludge versus cholelithiasis with no associated acute cholecystitis or choledocholithiasis. 2. Innumerable hepatic metastases. Electronically Signed   By: Iven Finn M.D.   On: 01/15/2020 17:36      Scheduled Meds: . cholecalciferol  2,000 Units Oral Daily  . dexamethasone  4 mg Oral Daily  . diclofenac  75 mg Oral BID  . enoxaparin  80 mg Subcutaneous  BID  . ferrous sulfate  325 mg Oral Q breakfast  . folic acid-pyridoxine-cyancobalamin  1 tablet Oral Daily  . hydroxychloroquine  400 mg Oral Daily  . lipase/protease/amylase  24,000 Units Oral TID WC  . metoprolol succinate  50 mg Oral Daily  . mirtazapine  7.5 mg Oral QHS  . OLANZapine  7.5 mg Oral QHS  . sucralfate  1 g Oral TID WC & HS   Continuous Infusions: . dextrose 5% lactated ringers 100 mL/hr at 01/15/20 2156  . piperacillin-tazobactam (ZOSYN)  IV 3.375 g (01/16/20 0958)     LOS: 1 day      Time spent: 35 minutes   Dessa Phi, DO [facility] 01/16/2020, 10:15 AM   Available via Epic secure chat 7am-7pm After these hours, please refer to coverage provider listed on [email or web address]

## 2020-01-16 NOTE — Progress Notes (Signed)
Patient a yellow mews d/t increased heart rate of 120. No change in condition, remains stable. MD not notified. Will continue to monitor.

## 2020-01-17 DIAGNOSIS — Z515 Encounter for palliative care: Secondary | ICD-10-CM

## 2020-01-17 DIAGNOSIS — Z7189 Other specified counseling: Secondary | ICD-10-CM

## 2020-01-17 DIAGNOSIS — T451X5A Adverse effect of antineoplastic and immunosuppressive drugs, initial encounter: Secondary | ICD-10-CM

## 2020-01-17 DIAGNOSIS — C259 Malignant neoplasm of pancreas, unspecified: Principal | ICD-10-CM

## 2020-01-17 DIAGNOSIS — T451X5D Adverse effect of antineoplastic and immunosuppressive drugs, subsequent encounter: Secondary | ICD-10-CM

## 2020-01-17 DIAGNOSIS — D6481 Anemia due to antineoplastic chemotherapy: Secondary | ICD-10-CM

## 2020-01-17 DIAGNOSIS — R112 Nausea with vomiting, unspecified: Secondary | ICD-10-CM

## 2020-01-17 LAB — CBC
HCT: 23.3 % — ABNORMAL LOW (ref 36.0–46.0)
Hemoglobin: 7.2 g/dL — ABNORMAL LOW (ref 12.0–15.0)
MCH: 29.8 pg (ref 26.0–34.0)
MCHC: 30.9 g/dL (ref 30.0–36.0)
MCV: 96.3 fL (ref 80.0–100.0)
Platelets: 193 10*3/uL (ref 150–400)
RBC: 2.42 MIL/uL — ABNORMAL LOW (ref 3.87–5.11)
RDW: 16 % — ABNORMAL HIGH (ref 11.5–15.5)
WBC: 7 10*3/uL (ref 4.0–10.5)
nRBC: 0 % (ref 0.0–0.2)

## 2020-01-17 LAB — COMPREHENSIVE METABOLIC PANEL
ALT: 159 U/L — ABNORMAL HIGH (ref 0–44)
AST: 126 U/L — ABNORMAL HIGH (ref 15–41)
Albumin: 2.3 g/dL — ABNORMAL LOW (ref 3.5–5.0)
Alkaline Phosphatase: 298 U/L — ABNORMAL HIGH (ref 38–126)
Anion gap: 9 (ref 5–15)
BUN: 17 mg/dL (ref 6–20)
CO2: 25 mmol/L (ref 22–32)
Calcium: 8.8 mg/dL — ABNORMAL LOW (ref 8.9–10.3)
Chloride: 105 mmol/L (ref 98–111)
Creatinine, Ser: 0.84 mg/dL (ref 0.44–1.00)
GFR, Estimated: >60 mL/min (ref >60)
Glucose, Bld: 103 mg/dL — ABNORMAL HIGH (ref 70–99)
Potassium: 3.8 mmol/L (ref 3.5–5.1)
Sodium: 139 mmol/L (ref 135–145)
Total Bilirubin: 0.9 mg/dL (ref 0.3–1.2)
Total Protein: 6.7 g/dL (ref 6.5–8.1)

## 2020-01-17 LAB — PREPARE RBC (CROSSMATCH)

## 2020-01-17 MED ORDER — SODIUM CHLORIDE 0.9% IV SOLUTION: Freq: Once | INTRAVENOUS | Status: AC

## 2020-01-17 MED ORDER — GUAIFENESIN 100 MG/5ML PO SOLN
10.0000 mL | ORAL | Status: DC | PRN
  Administered 2020-01-17: 200 mg via ORAL
  Filled 2020-01-17: qty 10

## 2020-01-17 MED ORDER — ENSURE ENLIVE PO LIQD
237.0000 mL | Freq: Two times a day (BID) | ORAL | Status: DC
  Administered 2020-01-18: 237 mL via ORAL

## 2020-01-17 MED ORDER — CHLORHEXIDINE GLUCONATE CLOTH 2 % EX PADS
6.0000 | MEDICATED_PAD | Freq: Every day | CUTANEOUS | Status: DC
  Administered 2020-01-17 – 2020-01-18 (×2): 6 via TOPICAL

## 2020-01-17 MED ORDER — SODIUM CHLORIDE 0.9% FLUSH: 10.0000 mL | INTRAVENOUS | Status: DC | PRN

## 2020-01-17 NOTE — Progress Notes (Signed)
[facility] Progress Note  Cynthia Frye 59 y.o. 12/26/1960  CC:   Abnormal LFTs, metastatic pancreatic cancer   Subjective: Patient seen and examined at bedside.  Denies any reflux.  Denies abdominal pain or vomiting.  ROS : Afebrile, negative for chest pain.  Positive for anxiety   Objective: Vital signs in last 24 hours: Vitals:   01/16/20 2223 01/17/20 0457  BP: 112/61 117/65  Pulse: 94 79  Resp: 17 18  Temp: 98.4 F (36.9 C) 97.6 F (36.4 C)  SpO2: 97% 92%    Physical Exam:  General:  Alert, cooperative, no distress, appears stated age  Head:  Normocephalic, without obvious abnormality, atraumatic  Eyes:  , EOM's intact,   Lungs:    No visible respiratory distress  Heart:  Regular rate and rhythm, S1, S2 normal  Abdomen:   Soft, non-tender, nondistended, bowel sounds present, No  peritoneal signs  Extremities Extremities atraumatic  Psych  anxious    Lab Results: Recent Labs    01/16/20 0541 01/17/20 0341  NA 134* 139  K 4.0 3.8  CL 102 105  CO2 23 25  GLUCOSE 97 103*  BUN 14 17  CREATININE 0.66 0.84  CALCIUM 8.8* 8.8*   Recent Labs    01/16/20 0541 01/17/20 0341  AST 197* 126*  ALT 231* 159*  ALKPHOS 377* 298*  BILITOT 1.7* 0.9  PROT 7.7 6.7  ALBUMIN 2.8* 2.3*   Recent Labs    01/15/20 1318 01/15/20 2043 01/16/20 0541 01/17/20 0341  WBC 8.8   < > 8.9 7.0  NEUTROABS 6.1  --   --   --   HGB 9.7*   < > 9.3* 7.2*  HCT 29.5*   < > 29.4* 23.3*  MCV 90.5   < > 95.1 96.3  PLT 242   < > 201 193   < > = values in this interval not displayed.   No results for input(s): LABPROT, INR in the last 72 hours.    Assessment/Plan: -Abnormal LFTs and patient with metastatic pancreatic cancer with worsening liver mets.  Most likely from increasing tumor burden in the liver.T bili of 1.7, alkaline phosphatase 377, ALT 231 and AST 197.  Normal lipase.  Normal white count.  Stable hemoglobin at 9.3.  Normal platelet counts. -Nausea,  vomiting and intermittent dysphagia.  EGD December 10, 2019 showed 4 mm distal esophageal ulcer, duodenitis and gastritis.  Biopsies were negative for H. pylori and malignancy. -Pulmonary embolism.  On full dose Lovenox  Recommendations ------------------------- -MRI MRCP showed dilated pancreatic duct as well as enlarging mass at the junction of pancreatic head and body.  Mass abuts the common bile duct leading to mild narrowing of CBD of 0.3 cm. -Patient's total bilirubin is normal now.  LFTs trending down -There is no need for urgent/emergent ERCP.  I believe her abnormal LFTs is from worsening liver metastasis. -I will discuss with Dr. Watt Climes and if ERCP is needed we will arrange for outpatient ERCP for possible biliary stent placement. -Continue Protonix while in the hospital.  DC Protonix on discharge because of history of QT prolongation. -Continue Carafate.  Discussed with hospitalist.  Advance diet as tolerated. -No further inpatient GI work-up planned.  GI will sign off.  Call us back if needed   Otis Brace MD, [place] 01/17/2020, 8:47 AM  Contact #  [phone]

## 2020-01-17 NOTE — Progress Notes (Signed)
Initial Nutrition Assessment  DOCUMENTATION CODES:   Not applicable  INTERVENTION:  Ensure Enlive po BID, each supplement provides 350 kcal and 20 grams of protein (mixed with 2% milk)  Educated on importance of taking pancreatic enzymes prior to meals    NUTRITION DIAGNOSIS:   Increased nutrient needs related to cancer and cancer related treatments as evidenced by estimated needs.  GOAL:   Patient will meet greater than or equal to 90% of their needs    MONITOR:   PO intake,Supplement acceptance,Weight trends,Labs,I & O's  REASON FOR ASSESSMENT:   Malnutrition Screening Tool    ASSESSMENT:   59 year old female with history significant of pancreatic cancer currently on chemotherapy, asthma, degenerative disc disease, HTN, lupus, migraine headaches admitted for possible biliary obstruction after presenting with persistent nausea, vomiting, and constant abdominal pain.  RD working remotely.  Spoke with pt via phone, she reports eating most of her lunch, says she forgot to mention no spice and unable to eat most of the salmon. RD offered to have plain piece of salmon brought to room, pt appreciative but declined since it was getting close to dinner time. She reports some oral intake as well as cyclic TPN ([id number]) at home. She endorses no po intake in the last couple of days due to nausea. Reports she is still taking Creon, but is not consistent. RD educated on pancreatic enzymes, encouraged her to take before meals to aid with digestion. Patient has tried Ensure supplement in the past, stated they were too sweet. She is willing to try supplement mixed with milk.   Patient evaluated by GI, abnormal LFTs thought secondary to worsening liver metastasis, no need for urgent/emergent ERCP. Noted LFTs trending down today.   Diet advanced to soft, per flowsheet she consumed 80% of lunch tray.   Per chart, weights have decreased ~11 lbs (5.6%) in the last 3 months which is  insignificant for time frame, however concerning given currently undergoing chemotherapy for pancreatic cancer.   Medications reviewed and include: Vit D, Voltaren, Ferrous sulfate, Foltx, Creon 24000 units with meals, Remeron, Zyprexa, Carafate  Labs reviewed  NUTRITION - FOCUSED PHYSICAL EXAM: Unable to complete at this time, RD working remotely.   Diet Order:   Diet Order            DIET SOFT Room service appropriate? Yes; Fluid consistency: Thin  Diet effective now                 EDUCATION NEEDS:   No education needs have been identified at this time  Skin:  Skin Assessment: Reviewed RN Assessment  Last BM:  12/21  Height:   Ht Readings from Last 1 Encounters:  01/17/20 5\' 7"  (1.702 m)    Weight:   Wt Readings from Last 1 Encounters:  01/15/20 80.3 kg    BMI:  Body mass index is 27.74 kg/m.  Estimated Nutritional Needs:   Kcal:  2200-2400  Protein:  115-130  Fluid:  2.2 L/day    Lajuan Lines, RD, LDN Clinical Nutrition After Hours/Weekend Pager # in [place]

## 2020-01-17 NOTE — Consult Note (Signed)
Consultation Note Date: 01/17/2020   Patient Name: Cynthia Frye  DOB: December 02, 1960  MRN: [id number]  Age / Sex: 59 y.o., female  PCP: Josetta Huddle, MD Referring Physician: Dessa Phi, DO  Reason for Consultation: Establishing goals of care  HPI/Patient Profile: 59 y.o. female  admitted on 01/15/2020    Clinical Assessment and Goals of Care: 59 year old lady with life limiting illness of metastatic pancreatic cancer admitted with worsening liver function test, found to have worsening liver metastatic disease burden on imaging.  Patient having symptoms of intermittent dysphagia intermittent nausea and vomiting.  Also found to have venous thromboembolism. GI specialists are following.  Palliative consult for goals of care discussions has been requested.  PMT has seen her in the past. Patient is awake alert resting in bed.  She just saw her GI doctor.  I introduced myself and palliative care as follows: Palliative medicine is specialized medical care for people living with serious illness. It focuses on providing relief from the symptoms and stress of a serious illness. The goal is to improve quality of life for both the patient and the family.  Goals of care: Broad aims of medical therapy in relation to the patient's values and preferences. Our aim is to provide medical care aimed at enabling patients to achieve the goals that matter most to them, given the circumstances of their particular medical situation and their constraints.   Discussed with her about her current condition, we talked about symptom management.  We talked about how things have been going for her at home.  Patient has a look of fear.  Spent some time providing active listening supportive empathic presence and empathic communication.  Acknowledged  her fears regarding her serious illness.  Patient's MRI showing dilated pancreatic duct  enlarging mass at junction of pancreatic head and body patient is supposed to have outpatient ERCP possible biliary stent placement that is to be arranged by GI specialist.  She is to have her diet advanced.  She has TPN at home that she changes and has visited from a nurse who comes once a week.  Attempted to initiate discussions about possibility of cancer progression, worsening of her metastatic disease burden.  Patient becomes anxious and states that she is trying to do all that she can to get better, she remains hopeful for some degree of stabilization/recovery and she remains hopeful that further treatments might be offered with regards to her cancer.  A discussion was held around that.  Offered supportive care.  NEXT OF KIN Lives at home with husband in [place], [place].  She has a brother and some extended family also in the area.  SUMMARY OF RECOMMENDATIONS   Full code/full scope Continue current mode of care Medications for symptom management reviewed with patient.  Continue current regimen Patient asking about TPN that she has at home. Recommend home with home health care and home-based palliative support on discharge.  Code Status/Advance Care Planning:  Full code    Symptom Management:   As above, medication  history reviewed.  Palliative Prophylaxis:   Delirium Protocol  Additional Recommendations (Limitations, Scope, Preferences):  Full Scope Treatment  Psycho-social/Spiritual:   Desire for further Chaplaincy support:yes  Additional Recommendations: Caregiving  Support/Resources  Prognosis:   Unable to determine  Discharge Planning: To Be Determined      Primary Diagnoses: Present on Admission: . Biliary obstruction . Anemia associated with chemotherapy . Essential hypertension . Pancreatic cancer ([place]) . Nausea and vomiting in adult   I have reviewed the medical record, interviewed the patient and family, and examined the patient. The  following aspects are pertinent.  Past Medical History:  Diagnosis Date  . Allergies    peanuts, corn, beans, red grapefruit, naval oranges  . Asthma    allergy shots and medication  . Cancer [facility])    pancreatic  . Collagen vascular disease ([place])   . DDD (degenerative disc disease), lumbar   . Eustachian tube dysfunction 12/2012   rhinitis, vertigo- Dr. Wilburn Cornelia, ENT   . Fibroids   . Heart murmur    Echo 1/18: EF 55-60, no RWMA, normal diastolic function, trivial AI, PASP 32  . History of cardiac catheterization    LHC 3/18: normal coronary arteries.   . History of nuclear stress test    ETT-Myoview 2/18: EF 62, + ECG response; apical and distal septal ischemia; intermediate risk.  Mar[place] Kitchen Hypertension   . Iron deficiency anemia   . Lupus [facility]) 2011   Dr. Trudie Reed  . Migraine headache    trial of generic maxalt 10 mg, January 2021  . Obesity   . Seasonal allergies   . Seizure in childhood [facility])    as a child no treatment none x 30 years   Social History   Socioeconomic History  . Marital status: Married    Spouse name: Not on file  . Number of children: 0  . Years of education: Not on file  . Highest education level: Some college, no degree  Occupational History  . Occupation: Customer service manager   Tobacco Use  . Smoking status: Never Smoker  . Smokeless tobacco: Never Used  Vaping Use  . Vaping Use: Never used  Substance and Sexual Activity  . Alcohol use: No  . Drug use: No  . Sexual activity: Not on file  Other Topics Concern  . 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 Asthma Mother   . Diabetes Mother   . Hypertension Mother   . Heart attack Father 56  . CAD Father   . Stroke Maternal Aunt   . Stroke Paternal Aunt   . Stroke Paternal Uncle   . Gout Other        uncles   . Cancer Niece        paternal half sister's daughter; unknown type; unknown age diagnosed  . Ovarian cancer Neg Hx   . Breast cancer Neg Hx   . Colon cancer  Neg Hx   . Migraines Neg Hx    Scheduled Meds: . sodium chloride   Intravenous Once  . Chlorhexidine Gluconate Cloth  6 each Topical Daily  . cholecalciferol  2,000 Units Oral Daily  . diclofenac  75 mg Oral BID  . enoxaparin  80 mg Subcutaneous BID  . ferrous sulfate  325 mg Oral Q breakfast  . folic acid-pyridoxine-cyancobalamin  1 tablet Oral Daily  . hydroxychloroquine  400 mg Oral Daily  . lipase/protease/amylase  24,000 Units Oral TID WC  . metoprolol succinate  50 mg Oral Daily  . mirtazapine  7.5 mg Oral QHS  . OLANZapine  7.5 mg Oral QHS  . sucralfate  1 g Oral TID WC & HS   Continuous Infusions: . dextrose 5% lactated ringers 100 mL/hr at 01/17/20 0523  . piperacillin-tazobactam (ZOSYN)  IV 3.375 g (01/17/20 0224)   PRN Meds:.acetaminophen, diphenoxylate-atropine, dronabinol, fluticasone, HYDROmorphone (DILAUDID) injection, loperamide, LORazepam, metoprolol tartrate, ondansetron, oxyCODONE, promethazine, senna-docusate, sodium chloride flush, topiramate Medications Prior to Admission:  Prior to Admission medications   Medication Sig Start Date End Date Taking? Authorizing Provider  Cholecalciferol (VITAMIN D) 2000 units CAPS Take 2,000 Units by mouth daily.    Yes [provider]  dexamethasone (DECADRON) 4 MG tablet Take 1 tablet (4 mg total) by mouth daily. Take 1 tablet once daily for 3 to 5 days after chemo 10/29/19  Yes Alla Feeling, NP  diclofenac (VOLTAREN) 75 MG EC tablet Take 1 tablet (75 mg total) by  mouth 2 (two) times daily as needed. Patient taking differently: Take 75 mg by mouth 2 (two) times daily. 12/12/19  Yes Shelly Coss, MD  Dietary Management Product (RHEUMATE) CAPS Take 1 capsule by mouth daily.    Yes [provider]  diphenoxylate-atropine (LOMOTIL) 2.5-0.025 MG tablet Take 2 tablets by mouth 4 (four) times daily as needed for diarrhea or loose stools (use 2nd, if Imodium doesn't work. Max dose: 8 tablets/day.). Patient taking differently: Take 2 tablets by mouth 4 (four) times daily as needed for diarrhea or loose stools. 11/09/19  Yes Orson Slick, MD  dronabinol (MARINOL) 2.5 MG capsule Take 1 capsule (2.5 mg total) by mouth 2 (two) times daily before a meal. Patient taking differently: Take 2.5 mg by mouth 2 (two) times daily as needed (nausea). 11/14/19  Yes Truitt Merle, MD  enoxaparin (LOVENOX) 80 MG/0.8ML injection Inject 0.8 mLs (80 mg total) into the skin every 12 (twelve) hours. 01/09/20 04/08/20 Yes Alla Feeling, NP  ferrous sulfate 325 (65 FE) MG tablet Take 325 mg by mouth daily with breakfast.   Yes [provider]  fluticasone (FLONASE) 50 MCG/ACT nasal spray Place 2 sprays into both nostrils daily. Patient taking differently: Place 2 sprays into both nostrils daily as needed for allergies. 04/29/16  Yes Jeffery, Domingo Mend, PA  hydroxychloroquine (PLAQUENIL) 200 MG tablet Take 400 mg by mouth daily.  06/17/14  Yes [provider]  loperamide (IMODIUM) 2 MG capsule Take 2 capsules (4 mg total) by mouth 2 (two) times daily as needed for diarrhea or loose stools (Use 1st). 11/09/19  Yes Hongalgi, Lenis Dickinson, MD  LORazepam (ATIVAN) 0.5 MG tablet Take 1 tablet (0.5 mg total) by mouth every 8 (eight) hours as needed for anxiety (and anticiaptory n/v. DO NOT TAKE WITH XANAX). 11/21/19  Yes Alla Feeling, NP  metoprolol succinate (TOPROL-XL) 50 MG 24 hr tablet Take 50 mg by mouth daily.    Yes [provider]  mirtazapine (REMERON) 7.5 MG  tablet Take 1 tablet (7.5 mg total) by mouth at bedtime. 01/08/20  Yes Alla Feeling, NP  OLANZapine (ZYPREXA) 7.5 MG tablet Take 1 tablet (7.5 mg total) by mouth at bedtime. 12/12/19  Yes Shelly Coss, MD  ondansetron (ZOFRAN) 8 MG tablet Take 1 tablet (8 mg total) by mouth every 8 (eight) hours as needed for nausea or vomiting. 12/24/19  Yes Truitt Merle, MD  oxyCODONE (OXY IR/ROXICODONE) 5 MG immediate release tablet Take 1 tablet (5 mg total) by mouth every 6 (six) hours as needed for severe pain. 12/18/19  Yes Truitt Merle, MD  senna-docusate (SENOKOT-S) 8.6-50 MG tablet Take 1 tablet by mouth 2 (two) times daily. Patient taking differently: Take 1 tablet by mouth 2 (two) times daily as needed for mild constipation. 12/12/19  Yes Shelly Coss, MD  sucralfate (CARAFATE) 1 GM/10ML suspension Take 10 mLs (1 g total) by mouth 4 (four) times daily -  with meals and at bedtime. 12/12/19  Yes Shelly Coss, MD  topiramate (TOPAMAX) 50 MG tablet Take 1 tablet (50 mg total) by mouth at bedtime. Patient taking differently: Take 50 mg by mouth daily as needed (migraines). 11/09/19  Yes Hongalgi, Lenis Dickinson, MD  feeding supplement (ENSURE ENLIVE / ENSURE PLUS) LIQD Take 237 mLs by mouth 2 (two) times daily between meals. Patient not taking: Reported on 01/15/2020 11/09/19   Modena Jansky, MD  lipase/protease/amylase [phone] units CPEP Take 1 capsule (24,000 Units total) by mouth 3 (three) times daily with meals. 12/12/19   Shelly Coss, MD  polyethylene glycol (MIRALAX / GLYCOLAX) 17 g packet Take 17 g by mouth daily. Patient not taking: Reported on 01/15/2020 12/13/19   Shelly Coss, MD  potassium chloride 20 MEQ/15ML (10%) SOLN Take 15 mLs (20 mEq total) by mouth 2 (two) times daily. Patient not taking: Reported on 01/15/2020 11/21/19   Alla Feeling, NP  prochlorperazine (COMPAZINE) 10 MG tablet Take 1 tablet (10 mg total) by mouth every 6 (six) hours as needed (Nausea or  vomiting). Patient taking differently: Take 10 mg by mouth every 6 (six) hours as needed for nausea or vomiting.  10/18/19 12/18/19  Truitt Merle, MD   Allergies  Allergen Reactions  . Cinnamon Other (See Comments)    Other reaction(s): Other (See Comments) Unknown  On allergy test Unknown  On allergy test  . Peanut-Containing Drug Products Hives  . Prednisone Other (See Comments)    Interacts with another medicine she is taking  . Corn Oil Hives  . Corn-Containing Products Hives  . Other Rash    Red grapefruit and naval oranges- lips tingling and facial rash Potatoes, tomatoes, garlic, oregano/basil caused headache Other reaction(s): migratory headache   Review of Systems Denies pain Physical Exam Patient is awake alert resting in bed has a mask on her face, appears to have some look of fear/anxiety Regular work of breathing S1-S2 Abdomen is soft nondistended nontender No edema on extremities No focal deficits  Vital Signs: BP 117/65 (BP Location: Right Arm)   Pulse 79   Temp 97.6 F (36.4 C) (Oral)   Resp 18   Ht 5\' 7"  (1.702 m)   SpO2 92%   BMI 27.74 kg/m  Pain Scale: 0-10   Pain Score: 0-No pain   SpO2: SpO2: 92 % O2 Device:SpO2: 92 % O2 Flow Rate: .   IO: Intake/output summary:   Intake/Output Summary (Last 24 hours) at 01/17/2020 [id number] Last data filed at 01/16/2020 2200 Gross per  24 hour  Intake 0 ml  Output 0 ml  Net 0 ml    LBM: Last BM Date: 01/15/20 Baseline Weight:   Most recent weight:       Palliative Assessment/Data:   PPS 50%  Time In:  8 Time Out:  9 Time Total:  60  Greater than 50%  of this time was spent counseling and coordinating care related to the above assessment and plan.  Signed by: Loistine Chance, MD   Please contact Palliative Medicine Team phone at [phone] for questions and concerns.  For individual provider: See Shea Evans

## 2020-01-17 NOTE — Progress Notes (Signed)
PROGRESS NOTE    Cynthia Frye  [id number] DOB: May 01, 1960 DOA: 01/15/2020 PCP: Josetta Huddle, MD     Brief Narrative:  Cynthia Frye is a 59 y.o. female with medical history significant of pancreatic cancer currently on chemotherapy, asthma, degenerative disc disease, hypertension, lupus, migraine headaches who was supposed to have chemotherapy on 12/21 and went to the cancer center.  She has been having persistent nausea, vomiting, and constant abdominal pain.  Patient has been on chronic antiemetics.  She was seen pre-chemo and found to have worsening liver function test.  There was concern for biliary obstruction.  Her pain was rated as 6 out of 10.  No significant change from her previous level of pain.  Due to worsening liver function however recommendation was to admit the patient for evaluation of biliary obstruction with GI evaluation.  Patient noted to have fever with temperature up to 101.  No other symptoms.   New events last 24 hours / Subjective: No complaints of nausea or vomiting.  No complaints of worsening abdominal pain.  Assessment & Plan:   Principal Problem:   Biliary obstruction Active Problems:   Essential hypertension   Pancreatic cancer (HCC)   Nausea and vomiting in adult   Anemia associated with chemotherapy   Metastatic pancreatic cancer with concern for worsening disease process -Ultrasound abdomen: Gallbladder sludge versus cholelithiasis with no associated acute cholecystitis or choledocholithiasis -MRCP: dilated pancreatic duct as well as enlarging mass at the junction of pancreatic head and body.  Mass abuts the common bile duct leading to mild narrowing of CBD of 0.3 cm -GI signed off 12/23: No need for urgent/emergent ERCP, abnormal LFTs thought to be secondary to worsening liver metastasis.  Follow-up as outpatient -Follows with Dr. Burr Medico: As outpatient appointment scheduled for 01/29/20   -Continue to trend LFT, trending down today   SIRS  without source of infection found  -Presented with tachycardia heart rate 130, fever T-max 102.7, tachypnea respiratory rate 21 -Blood cultures negative to date  -Influenza, Covid negative -UA pending  -No signs of cholangitis.  Stop antibiotic today and monitor  Symptomatic anemia -Transfuse 1u pRBC to goal Hgb > 8   Recent diagnosis of DVT -Continue Lovenox  Goals of care -Appreciate palliative care medicine  Lupus -Continue Plaquenil  Essential hypertension -Continue Toprol.  Patient's HCTZ, losartan were recently discontinued after hospitalization in October 2021   DVT prophylaxis: Lovenox   Code Status: Full code Family Communication: No family at bedside Disposition Plan:  Status is: Inpatient  Remains inpatient appropriate because:Inpatient level of care appropriate due to severity of illness   Dispo: The patient is from: Home              Anticipated d/c is to: Home              Anticipated d/c date is: 1 day              Patient currently is not medically stable to d/c.  Advance diet today.  Hopeful discharge home 12/24      Consultants:   GI  Oncology  Palliative care medicine  Procedures:   None  Antimicrobials:  Anti-infectives (From admission, onward)   Start     Dose/Rate Route Frequency Ordered Stop   01/16/20 1000  hydroxychloroquine (PLAQUENIL) tablet 400 mg        400 mg Oral Daily 01/15/20 2156     01/16/20 0900  piperacillin-tazobactam (ZOSYN) IVPB 3.375 g  Status:  Discontinued  3.375 g 12.5 mL/hr over 240 Minutes Intravenous Every 8 hours 01/16/20 0738 01/17/20 1227       Objective: Vitals:   01/16/20 1536 01/16/20 2223 01/17/20 0457 01/17/20 0500  BP: 129/83 112/61 117/65   Pulse: (!) 104 94 79   Resp: 16 17 18    Temp: 98.1 F (36.7 C) 98.4 F (36.9 C) 97.6 F (36.4 C)   TempSrc: Oral Oral Oral   SpO2: 100% 97% 92%   Height:    5\' 7"  (1.702 m)    Intake/Output Summary (Last 24 hours) at 01/17/2020  1234 Last data filed at 01/16/2020 2200 Gross per 24 hour  Intake 0 ml  Output 0 ml  Net 0 ml   There were no vitals filed for this visit.  Examination: General exam: Appears calm and comfortable  Respiratory system: Clear to auscultation. Respiratory effort normal. Cardiovascular system: S1 & S2 heard, RRR. No pedal edema. Gastrointestinal system: Abdomen is nondistended, soft and nontender. Normal bowel sounds heard. Central nervous system: Alert and oriented. Non focal exam. Speech clear  Extremities: Symmetric in appearance bilaterally  Skin: No rashes, lesions or ulcers on exposed skin  Psychiatry: Judgement and insight appear stable. Mood & affect appropriate.    Data Reviewed: I have personally reviewed following labs and imaging studies  CBC: Recent Labs  Lab 01/15/20 1318 01/15/20 2043 01/16/20 0541 01/17/20 0341  WBC 8.8 8.1 8.9 7.0  NEUTROABS 6.1  --   --   --   HGB 9.7* 8.4* 9.3* 7.2*  HCT 29.5* 26.4* 29.4* 23.3*  MCV 90.5 93.6 95.1 96.3  PLT 242 189 201 0000000   Basic Metabolic Panel: Recent Labs  Lab 01/15/20 1318 01/15/20 2228 01/16/20 0541 01/17/20 0341  NA 136  --  134* 139  K 4.1  --  4.0 3.8  CL 101  --  102 105  CO2 25  --  23 25  GLUCOSE 115*  --  97 103*  BUN 20  --  14 17  CREATININE 0.74 0.57 0.66 0.84  CALCIUM 9.1  --  8.8* 8.8*   GFR: Estimated Creatinine Clearance: 79.6 mL/min (by C-G formula based on SCr of 0.84 mg/dL). Liver Function Tests: Recent Labs  Lab 01/15/20 1318 01/16/20 0541 01/17/20 0341  AST 200* 197* 126*  ALT 273* 231* 159*  ALKPHOS 468* 377* 298*  BILITOT 1.6* 1.7* 0.9  PROT 8.6* 7.7 6.7  ALBUMIN 2.6* 2.8* 2.3*   Recent Labs  Lab 01/15/20 2228  LIPASE 21   No results for input(s): AMMONIA in the last 168 hours. Coagulation Profile: No results for input(s): INR, PROTIME in the last 168 hours. Cardiac Enzymes: No results for input(s): CKTOTAL, CKMB, CKMBINDEX, TROPONINI in the last 168 hours. BNP (last  3 results) No results for input(s): PROBNP in the last 8760 hours. HbA1C: No results for input(s): HGBA1C in the last 72 hours. CBG: No results for input(s): GLUCAP in the last 168 hours. Lipid Profile: No results for input(s): CHOL, HDL, LDLCALC, TRIG, CHOLHDL, LDLDIRECT in the last 72 hours. Thyroid Function Tests: No results for input(s): TSH, T4TOTAL, FREET4, T3FREE, THYROIDAB in the last 72 hours. Anemia Panel: No results for input(s): VITAMINB12, FOLATE, FERRITIN, TIBC, IRON, RETICCTPCT in the last 72 hours. Sepsis Labs: Recent Labs  Lab 01/15/20 1800 01/15/20 2043  LATICACIDVEN 1.3 1.1    Recent Results (from the past 240 hour(s))  Resp Panel by RT-PCR (Flu A&B, Covid) Nasopharyngeal Swab     Status: None   Collection  Time: 01/15/20  5:42 PM   Specimen: Nasopharyngeal Swab; Nasopharyngeal(NP) swabs in vial transport medium  Result Value Ref Range Status   SARS Coronavirus 2 by RT PCR NEGATIVE NEGATIVE Final    Comment: (NOTE) SARS-CoV-2 target nucleic acids are NOT DETECTED.  The SARS-CoV-2 RNA is generally detectable in upper respiratory specimens during the acute phase of infection. The lowest concentration of SARS-CoV-2 viral copies this assay can detect is 138 copies/mL. A negative result does not preclude SARS-Cov-2 infection and should not be used as the sole basis for treatment or other patient management decisions. A negative result may occur with  improper specimen collection/handling, submission of specimen other than nasopharyngeal swab, presence of viral mutation(s) within the areas targeted by this assay, and inadequate number of viral copies(<138 copies/mL). A negative result must be combined with clinical observations, patient history, and epidemiological information. The expected result is Negative.  Fact Sheet for Patients:  EntrepreneurPulse.com.au  Fact Sheet for Healthcare Providers:   IncredibleEmployment.be  This test is no t yet approved or cleared by the [place] FDA and  has been authorized for detection and/or diagnosis of SARS-CoV-2 by FDA under an Emergency Use Authorization (EUA). This EUA will remain  in effect (meaning this test can be used) for the duration of the COVID-19 declaration under Section 564(b)(1) of the Act, 21 U.S.C.section 360bbb-3(b)(1), unless the authorization is terminated  or revoked sooner.       Influenza A by PCR NEGATIVE NEGATIVE Final   Influenza B by PCR NEGATIVE NEGATIVE Final    Comment: (NOTE) The Xpert Xpress SARS-CoV-2/FLU/RSV plus assay is intended as an aid in the diagnosis of influenza from Nasopharyngeal swab specimens and should not be used as a sole basis for treatment. Nasal washings and aspirates are unacceptable for Xpert Xpress SARS-CoV-2/FLU/RSV testing.  Fact Sheet for Patients: EntrepreneurPulse.com.au  Fact Sheet for Healthcare Providers: IncredibleEmployment.be  This test is not yet approved or cleared by the [place] FDA and has been authorized for detection and/or diagnosis of SARS-CoV-2 by FDA under an Emergency Use Authorization (EUA). This EUA will remain in effect (meaning this test can be used) for the duration of the COVID-19 declaration under Section 564(b)(1) of the Act, 21 U.S.C. section 360bbb-3(b)(1), unless the authorization is terminated or revoked.  Performed at [facility], [place] [address] [place], [place] [address]   Blood culture (routine x 2)     Status: None (Preliminary result)   Collection Time: 01/15/20  6:00 PM   Specimen: BLOOD LEFT FOREARM  Result Value Ref Range Status   Specimen Description   Final    BLOOD LEFT FOREARM Performed at [facility] [address] [place], [place] [address]    Special Requests   Final    BOTTLES DRAWN AEROBIC AND ANAEROBIC Blood  Culture adequate volume Performed at [facility] [address], [place], [place] [address]    Culture   Final    NO GROWTH 2 DAYS Performed at [facility] [address], [place], [place] [address]    Report Status PENDING  Incomplete  Blood culture (routine x 2)     Status: None (Preliminary result)   Collection Time: 01/15/20  6:00 PM   Specimen: BLOOD LEFT HAND  Result Value Ref Range Status   Specimen Description   Final    BLOOD LEFT HAND Performed at [facility] [address] [place],  [address]    Special  Requests   Final    BOTTLES DRAWN AEROBIC AND ANAEROBIC Blood Culture adequate volume Performed at [facility] [address], [place], [place] [address]    Culture   Final    NO GROWTH 2 DAYS Performed at [facility] [address], [place], [place] [address]    Report Status PENDING  Incomplete      Radiology Studies: US Abdomen Complete  Result Date: 01/15/2020 CLINICAL DATA:  Acetic pancreatic cancer to the liver. New hyperbilirubinemia. Rule out biliary obstruction EXAM: ABDOMEN ULTRASOUND COMPLETE COMPARISON:  Ultrasound abdomen 10/30/2019 FINDINGS: Gallbladder: Sludge versus tiny gallstones within the gallbladder lumen. No sonographic Murphy sign noted by sonographer. Common bile duct: Diameter: 6 mm Liver: Innumerable hypodense solid hepatic masses within bilateral hepatic lobes with the largest measuring 2.9 x 2.6 x 3 cm. Remainder of the hepatic demonstrates normal parenchymal echogenicity. Portal vein is patent on color Doppler imaging with normal direction of blood flow towards the liver. IVC: No abnormality visualized. Pancreas: Not visualized due to gaseous abdomen. Spleen: Size and appearance within normal limits. Right Kidney: Length: 11.9 cm. Echogenicity within normal limits. No mass or hydronephrosis visualized. Left Kidney: Length: 11.8 cm. Echogenicity within normal  limits. No mass or hydronephrosis visualized. Abdominal aorta: No aneurysm visualized. Other findings: None. IMPRESSION: 1. Gallbladder sludge versus cholelithiasis with no associated acute cholecystitis or choledocholithiasis. 2. Innumerable hepatic metastases. Electronically Signed   By: Iven Finn M.D.   On: 01/15/2020 17:36   MR 3D Recon At Scanner  Result Date: 01/17/2020 CLINICAL DATA:  Jaundice, history of pancreatic cancer EXAM: MRI ABDOMEN WITHOUT AND WITH CONTRAST (INCLUDING MRCP) TECHNIQUE: Multiplanar multisequence MR imaging of the abdomen was performed both before and after the administration of intravenous contrast. Heavily T2-weighted images of the biliary and pancreatic ducts were obtained, and three-dimensional MRCP images were rendered by post processing. CONTRAST:  58mL GADAVIST GADOBUTROL 1 MMOL/ML IV SOLN COMPARISON:  Multiple exams, including 11/24/2019 FINDINGS: Please note that images for today's exam span 2 different accession numbers, with post-contrast images in 1 record and precontrast images in the other record. Lower chest: Trace left pleural effusion with adjacent atelectasis in the left lower lobe. Trace right pleural effusion. Borderline cardiomegaly. A right lower axillary lymph node measures 0.8 cm in short axis on image 3 of series 3. Hepatobiliary: Extensive metastatic burden throughout all lobes of the liver, substantially increased from 11/24/2019 and fairly similar to 01/11/2020. Sludge in the gallbladder with gallbladder wall thickening. Minimal intrahepatic biliary dilatation with the common hepatic duct at 0.9 cm in diameter, and narrowing of the common bile duct down to about 0.3 cm in diameter in the vicinity of the pancreatic mass due to extrinsic compression. Expected tapering course of the distal most CBD on image 12 of series 5. Pancreas: Prominently dilated dorsal pancreatic duct dilated side ducts, extending to the level of the mass in the junction of the  pancreatic head and body. The mass measures 3.6 by 2.7 cm on image 64 series 7 and abuts the gastric wall, portal vein, and common bile duct along with obstructing the pancreatic duct. By my measurements, this primary mass was previously 3.5 by 2.5 cm on 11/24/2019. Spleen:  Unremarkable Adrenals/Urinary Tract:  Unremarkable Stomach/Bowel: Pancreatic mass abuts the posterior wall of the stomach. Vascular/Lymphatic: Small peripancreatic lymph nodes without overt pathologic adenopathy. Other:  No supplemental non-categorized findings. Musculoskeletal: Degenerative disc disease at L5-S1. IMPRESSION: 1. Prominently dilated dorsal pancreatic duct extending to the level  of the mass in the junction of the pancreatic head and body. The mass abuts the gastric wall, portal vein, and common bile duct, leading to mild narrowing of the common bile duct down to about 0.3 cm, with the common hepatic duct mildly prominent 0.9 cm. The mass has mildly enlarged compared to the CT examination of 11/24/2019. 2. Extensive metastatic burden throughout all lobes of the liver, substantially increased from 11/24/2019 and fairly similar to 01/11/2020. 3. Trace left pleural effusion with adjacent atelectasis in the left lower lobe. Borderline cardiomegaly. 4. Sludge in the gallbladder with gallbladder wall thickening. Electronically Signed   By: Van Clines M.D.   On: 01/17/2020 08:29   MR ABDOMEN MRCP W WO CONTAST  Result Date: 01/17/2020 CLINICAL DATA:  Jaundice, history of pancreatic cancer EXAM: MRI ABDOMEN WITHOUT AND WITH CONTRAST (INCLUDING MRCP) TECHNIQUE: Multiplanar multisequence MR imaging of the abdomen was performed both before and after the administration of intravenous contrast. Heavily T2-weighted images of the biliary and pancreatic ducts were obtained, and three-dimensional MRCP images were rendered by post processing. CONTRAST:  57mL GADAVIST GADOBUTROL 1 MMOL/ML IV SOLN COMPARISON:  Multiple exams, including  11/24/2019 FINDINGS: Please note that images for today's exam span 2 different accession numbers, with post-contrast images in 1 record and precontrast images in the other record. Lower chest: Trace left pleural effusion with adjacent atelectasis in the left lower lobe. Trace right pleural effusion. Borderline cardiomegaly. A right lower axillary lymph node measures 0.8 cm in short axis on image 3 of series 3. Hepatobiliary: Extensive metastatic burden throughout all lobes of the liver, substantially increased from 11/24/2019 and fairly similar to 01/11/2020. Sludge in the gallbladder with gallbladder wall thickening. Minimal intrahepatic biliary dilatation with the common hepatic duct at 0.9 cm in diameter, and narrowing of the common bile duct down to about 0.3 cm in diameter in the vicinity of the pancreatic mass due to extrinsic compression. Expected tapering course of the distal most CBD on image 12 of series 5. Pancreas: Prominently dilated dorsal pancreatic duct dilated side ducts, extending to the level of the mass in the junction of the pancreatic head and body. The mass measures 3.6 by 2.7 cm on image 64 series 7 and abuts the gastric wall, portal vein, and common bile duct along with obstructing the pancreatic duct. By my measurements, this primary mass was previously 3.5 by 2.5 cm on 11/24/2019. Spleen:  Unremarkable Adrenals/Urinary Tract:  Unremarkable Stomach/Bowel: Pancreatic mass abuts the posterior wall of the stomach. Vascular/Lymphatic: Small peripancreatic lymph nodes without overt pathologic adenopathy. Other:  No supplemental non-categorized findings. Musculoskeletal: Degenerative disc disease at L5-S1. IMPRESSION: 1. Prominently dilated dorsal pancreatic duct extending to the level of the mass in the junction of the pancreatic head and body. The mass abuts the gastric wall, portal vein, and common bile duct, leading to mild narrowing of the common bile duct down to about 0.3 cm, with the  common hepatic duct mildly prominent 0.9 cm. The mass has mildly enlarged compared to the CT examination of 11/24/2019. 2. Extensive metastatic burden throughout all lobes of the liver, substantially increased from 11/24/2019 and fairly similar to 01/11/2020. 3. Trace left pleural effusion with adjacent atelectasis in the left lower lobe. Borderline cardiomegaly. 4. Sludge in the gallbladder with gallbladder wall thickening. Electronically Signed   By: Van Clines M.D.   On: 01/17/2020 08:29      Scheduled Meds: . Chlorhexidine Gluconate Cloth  6 each Topical Daily  . cholecalciferol  2,000 Units  Oral Daily  . diclofenac  75 mg Oral BID  . enoxaparin  80 mg Subcutaneous BID  . ferrous sulfate  325 mg Oral Q breakfast  . folic acid-pyridoxine-cyancobalamin  1 tablet Oral Daily  . hydroxychloroquine  400 mg Oral Daily  . lipase/protease/amylase  24,000 Units Oral TID WC  . metoprolol succinate  50 mg Oral Daily  . mirtazapine  7.5 mg Oral QHS  . OLANZapine  7.5 mg Oral QHS  . sucralfate  1 g Oral TID WC & HS   Continuous Infusions: . dextrose 5% lactated ringers 100 mL/hr at 01/17/20 1039     LOS: 2 days      Time spent: 25 minutes   Dessa Phi, DO [facility] 01/17/2020, 12:34 PM   Available via Epic secure chat 7am-7pm After these hours, please refer to coverage provider listed on [email or web address]

## 2020-01-17 NOTE — Progress Notes (Signed)
HEMATOLOGY-ONCOLOGY PROGRESS NOTE  SUBJECTIVE: Cynthia Frye was seen at the cancer center on the day of admission and was noted to have significant tachycardia and worsening transaminitis.  She was referred to the emergency room for admission.  The patient has been seen by GI and MRCP has been completed.  MRCP showed a prominently dilated dorsal pancreatic duct extending to the level of the mass in the junction of the pancreatic head and body-mass has mildly enlarged compared to prior CT, extensive metastatic burden throughout all lobes of the liver substantially increased from 11/24/2019.  GI is not planning for ERCP as an inpatient.  Will consider as an outpatient.  The patient offers no specific complaints this morning. She is not currently having any nausea or vomiting. Denies abdominal pain. Reports a mild, nonproductive cough. No recurrent fevers.  Oncology History Overview Note  Cancer Staging Pancreatic cancer [facility]) Staging form: Exocrine Pancreas, AJCC 8th Edition - Clinical stage from 10/18/2019: Stage IV (cT3, cN1, cM1) - Signed by Truitt Merle, MD on 10/18/2019    Pancreatic cancer ([place])  09/19/2019 Imaging   CT AP w contrast 09/19/19  IMPRESSION: 1. Findings are highly concerning for probable primary pancreatic adenocarcinoma in the anterior aspect of the pancreatic head. Several prominent borderline enlarged lymph nodes are noted in the hepatoduodenal nodal station, and there are multiple indeterminate liver lesions which are highly concerning for probable hepatic metastases. Further evaluation with nonemergent abdominal MRI with and without IV gadolinium with MRCP is recommended in the near future to better evaluate these findings.   09/25/2019 Procedure   Upper EUS by Dr Paulita Fujita  IMPRESSION -There was no evidence of significant pathology in the left lobe of the liver.  -A few lymph nodes were visualized and measures in the peripancreatic region and porta hepata region.   -Hyperchoic material consistent with sludge was visualized endosonographically in the gallbladder.  -There was no sign of significant pathology in the common bile duct.  -A mass was identified in the pancreatic head. If biopsy results show adenocarcinoma, it would be staged T3N1Mx by endosonographic criteria. Fine needle aspiration performed.    09/25/2019 Initial Biopsy   FINAL MICROSCOPIC DIAGNOSIS: Fine needle aspirate, Pancreas;  MALIGNANT CELLS PRESENT CONSISTENT WITH ADENOCARCINOMA.    10/02/2019 Initial Diagnosis   Pancreatic cancer ([place])   10/11/2019 Procedure   PAC placement y Dr Barry Dienes    10/15/2019 Imaging   CT Chest  IMPRESSION: 1. Small anterior left pneumothorax with dependent atelectasis in the left lower lobe. 2. Increased number of bilateral axillary and subpectoral lymph nodes with mild lymphadenopathy in the left axilla. While this would be an atypical presentation for metastatic pancreatic cancer, this possibility is not excluded 3. Main duct dilatation in the pancreas, better assessed on abdomen CT 09/19/2019.   10/16/2019 Imaging   MRI Abdomen  IMPRESSION: 1. Substantially motion degraded scan. 2. Probable persistent small anterior left lung base pneumothorax, better seen on chest CT from 1 day prior. 3. Poorly marginated hypoenhancing 3.7 x 2.9 cm pancreatic head mass, which appears to invade the anterior peripancreatic fat, compatible with known pancreatic adenocarcinoma. Diffuse irregular dilatation of the main pancreatic duct in the pancreatic body and tail. Mild narrowing of the main portal vein by the mass. Abdominal vasculature remains patent and otherwise uninvolved. 4. Numerous (greater than 10) small liver masses scattered throughout the liver, largest 1.0 cm, which appear to demonstrate targetoid enhancement on the limited motion degraded postcontrast sequences, compatible with liver metastases. 5. Mild porta hepatis adenopathy,  suspicious for  metastatic disease.   10/18/2019 Cancer Staging   Staging form: Exocrine Pancreas, AJCC 8th Edition - Clinical stage from 10/18/2019: Stage IV (cT3, cN1, cM1) - Signed by Truitt Merle, MD on 10/18/2019   10/24/2019 - 11/21/2019 Chemotherapy   First-line FOLFIRINOX q2weeks starting 10/24/19. C2 postponed due to N/V/D and dose reduced 20-30%. Given poor tolerance, stopped after 2 cycles.    10/30/2019 Pathology Results   FINAL MICROSCOPIC DIAGNOSIS:   A. LIVER, LESION, BIOPSY:  -  Metastatic carcinoma  -  See comment   COMMENT:   By immunohistochemistry, the neoplastic cells are positive for  cytokeratin 7 and GATA3 with patchy nonspecific staining for PAX 8 but  negative for TTF-1, CDX2 and cytokeratin 20.  Overall, the findings are  consistent with metastasis of the patient's known breast carcinoma.  Prognostic panel (ER, PR, Her-2) is pending and will be reported in an  addendum.    ADDENDUM:   Dr. Laurence Ferrari notified us (November 01, 2019) that the patient was also  being worked up for a pancreatic mass.  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  In addition, ER and PR  are negative.  Gata-3 can be expressed in the pancreatic  adenocarcinomas; therefore, pancreatobiliary primary remains in the  differential.    11/02/2019 Genetic Testing   Negative genetic testing: no pathogenic variants detected in [employer] Common Hereditary Cancers Panel.  The report date is November 02, 2019.   The Common Hereditary Cancers Panel offered by [facility] includes sequencing and/or deletion duplication testing of the following 48 genes: APC, ATM, AXIN2, BARD1, BMPR1A, BRCA1, BRCA2, BRIP1, CDH1, CDK4, CDKN2A (p14ARF), CDKN2A (p16INK4a), CHEK2, CTNNA1, DICER1, EPCAM (Deletion/duplication testing only), GREM1 (promoter region deletion/duplication testing only), KIT, MEN1, MLH1, MSH2, MSH3, MSH6, MUTYH, NBN, NF1, NHTL1, PALB2, PDGFRA, PMS2, POLD1, POLE, PTEN, RAD50, RAD51C,  RAD51D, RNF43, SDHB, SDHC, SDHD, SMAD4, SMARCA4. STK11, TP53, TSC1, TSC2, and VHL.  The following genes were evaluated for sequence changes only: SDHA and HOXB13 c.251G>A variant only.   11/04/2019 Imaging   CT AP  IMPRESSION: 1. Circumferential bowel wall thickening with adjacent fat stranding throughout the colon, most predominant in the transverse colon. This is consistent with colitis, which may be infectious or inflammatory in etiology. 2. Ill-defined pancreatic head mass consistent with known malignancy, similar to mildly increased in comparison to prior CT. There are innumerable hypodense masses throughout the liver, increased in conspicuity in comparison to prior CT. Findings are worrisome for worsening metastatic disease. 3. Filling defect in the LEFT internal iliac vein, likely a thrombus with differential considerations including mixing artifact.   11/24/2019 Imaging   CT AP  IMPRESSION: Pancreatic head mass again noted compatible with patient's given history of pancreatic cancer.   Numerous metastases throughout the liver, enlarging since prior study.     12/10/2019 Procedure   Upper endoscopy by Dr Michail Sermon  IMPRESSION - Z-line regular, 42 cm from the incisors. - Esophageal ulcer with stigmata of recent bleeding. - Gastritis. Biopsied. - Mucosal changes in the duodenum. Biopsied. - Normal second portion of the duodenum. - Gastric and duodenal thickening concerning for inflammation from mets or due to mets from known pancreatic cancer.   FINAL MICROSCOPIC DIAGNOSIS:   A. DUODENUM, BIOPSY:  - Benign duodenal mucosa.  - No dysplasia or malignancy.   B. STOMACH, BIOPSY:  - Antral mucosa with mild chronic active inflammation.  - No intestinal metaplasia, dysplasia or carcinoma.   12/28/2019 -  Chemotherapy  Change her to second-line Gemcitabine 2 weeks on/1 week off starting 12/28/19. May add Cisplatin if tolerable.       REVIEW OF SYSTEMS:    Constitutional: Denies fevers, chills Eyes: Denies blurriness of vision Ears, nose, mouth, throat, and face: Denies mucositis or sore throat Respiratory: Reports intermittent nonproductive cough Cardiovascular: Denies palpitation, chest discomfort Gastrointestinal:  Denies nausea, heartburn or change in bowel habits Skin: Denies abnormal skin rashes Lymphatics: Denies new lymphadenopathy or easy bruising Neurological:Denies numbness, tingling or new weaknesses Behavioral/Psych: Mood is stable, no new changes  Extremities: No lower extremity edema All other systems were reviewed with the patient and are negative.  I have reviewed the past medical history, past surgical history, social history and family history with the patient and they are unchanged from previous note.   PHYSICAL EXAMINATION: ECOG PERFORMANCE STATUS: 3 - Symptomatic, >50% confined to bed  Vitals:   01/16/20 2223 01/17/20 0457  BP: 112/61 117/65  Pulse: 94 79  Resp: 17 18  Temp: 98.4 F (36.9 C) 97.6 F (36.4 C)  SpO2: 97% 92%   There were no vitals filed for this visit.  Intake/Output from previous day: No intake/output data recorded.  GENERAL:alert, no distress and comfortable SKIN: skin color, texture, turgor are normal, no rashes or significant lesions EYES: normal, Conjunctiva are pink and non-injected, sclera clear LUNGS: clear to auscultation and percussion with normal breathing effort HEART: regular rate & rhythm and no murmurs and no lower extremity edema ABDOMEN:abdomen soft, non-tender and normal bowel sounds Musculoskeletal:no cyanosis of digits and no clubbing  NEURO: alert & oriented x 3 with fluent speech, no focal motor/sensory deficits  LABORATORY DATA:  I have reviewed the data as listed CMP Latest Ref Rng & Units 01/17/2020 01/16/2020 01/15/2020  Glucose 70 - 99 mg/dL 103(H) 97 115(H)  BUN 6 - 20 mg/dL '17 14 20  ' Creatinine 0.44 - 1.00 mg/dL 0.84 0.66 0.57  Sodium 135 - 145 mmol/L  139 134(L) 136  Potassium 3.5 - 5.1 mmol/L 3.8 4.0 4.1  Chloride 98 - 111 mmol/L 105 102 101  CO2 22 - 32 mmol/L '25 23 25  ' Calcium 8.9 - 10.3 mg/dL 8.8(L) 8.8(L) 9.1  Total Protein 6.5 - 8.1 g/dL 6.7 7.7 8.6(H)  Total Bilirubin 0.3 - 1.2 mg/dL 0.9 1.7(H) 1.6(H)  Alkaline Phos 38 - 126 U/L 298(H) 377(H) 468(H)  AST 15 - 41 U/L 126(H) 197(H) 200(HH)  ALT 0 - 44 U/L 159(H) 231(H) 273(H)    Lab Results  Component Value Date   WBC 7.0 01/17/2020   HGB 7.2 (L) 01/17/2020   HCT 23.3 (L) 01/17/2020   MCV 96.3 01/17/2020   PLT 193 01/17/2020   NEUTROABS 6.1 01/15/2020    CT ANGIO CHEST PE W OR WO CONTRAST  Result Date: 01/11/2020 CLINICAL DATA:  Pancreatic cancer. Shortness of breath. Positive D-dimer. EXAM: CT ANGIOGRAPHY CHEST WITH CONTRAST TECHNIQUE: Multidetector CT imaging of the chest was performed using the standard protocol during bolus administration of intravenous contrast. Multiplanar CT image reconstructions and MIPs were obtained to evaluate the vascular anatomy. CONTRAST:  138m OMNIPAQUE IOHEXOL 350 MG/ML SOLN COMPARISON:  10/15/2019 FINDINGS: Cardiovascular: Pulmonary arterial opacification is satisfactory. No emboli are seen on the left. There are numerous pulmonary emboli present on the right. No large clot burden. No sign of right heart strain. Mediastinum/Nodes: No mediastinal or hilar mass or lymphadenopathy. Lungs/Pleura: Lungs are well aerated. No infiltrate, collapse or effusion. No evidence of pulmonary metastatic disease. Upper Abdomen: Widespread metastatic disease  throughout the liver, probably progressive since September. Musculoskeletal: Negative Bilateral axillary lymphadenopathy, similar to the study of September. Review of the MIP images confirms the above findings. IMPRESSION: 1. Numerous but small pulmonary emboli on the right. No large clot burden or evidence of right heart strain. 2. Widespread hepatic metastatic disease, probably progressive since September. 3.  These results will be called to the ordering clinician or representative by the Radiologist Assistant, and communication documented in the PACS or [employer]. Electronically Signed   By: Nelson Chimes M.D.   On: 01/11/2020 16:35   US Abdomen Complete  Result Date: 01/15/2020 CLINICAL DATA:  Acetic pancreatic cancer to the liver. New hyperbilirubinemia. Rule out biliary obstruction EXAM: ABDOMEN ULTRASOUND COMPLETE COMPARISON:  Ultrasound abdomen 10/30/2019 FINDINGS: Gallbladder: Sludge versus tiny gallstones within the gallbladder lumen. No sonographic Murphy sign noted by sonographer. Common bile duct: Diameter: 6 mm Liver: Innumerable hypodense solid hepatic masses within bilateral hepatic lobes with the largest measuring 2.9 x 2.6 x 3 cm. Remainder of the hepatic demonstrates normal parenchymal echogenicity. Portal vein is patent on color Doppler imaging with normal direction of blood flow towards the liver. IVC: No abnormality visualized. Pancreas: Not visualized due to gaseous abdomen. Spleen: Size and appearance within normal limits. Right Kidney: Length: 11.9 cm. Echogenicity within normal limits. No mass or hydronephrosis visualized. Left Kidney: Length: 11.8 cm. Echogenicity within normal limits. No mass or hydronephrosis visualized. Abdominal aorta: No aneurysm visualized. Other findings: None. IMPRESSION: 1. Gallbladder sludge versus cholelithiasis with no associated acute cholecystitis or choledocholithiasis. 2. Innumerable hepatic metastases. Electronically Signed   By: Iven Finn M.D.   On: 01/15/2020 17:36   MR 3D Recon At Scanner  Result Date: 01/17/2020 CLINICAL DATA:  Jaundice, history of pancreatic cancer EXAM: MRI ABDOMEN WITHOUT AND WITH CONTRAST (INCLUDING MRCP) TECHNIQUE: Multiplanar multisequence MR imaging of the abdomen was performed both before and after the administration of intravenous contrast. Heavily T2-weighted images of the biliary and pancreatic ducts were  obtained, and three-dimensional MRCP images were rendered by post processing. CONTRAST:  67m GADAVIST GADOBUTROL 1 MMOL/ML IV SOLN COMPARISON:  Multiple exams, including 11/24/2019 FINDINGS: Please note that images for today's exam span 2 different accession numbers, with post-contrast images in 1 record and precontrast images in the other record. Lower chest: Trace left pleural effusion with adjacent atelectasis in the left lower lobe. Trace right pleural effusion. Borderline cardiomegaly. A right lower axillary lymph node measures 0.8 cm in short axis on image 3 of series 3. Hepatobiliary: Extensive metastatic burden throughout all lobes of the liver, substantially increased from 11/24/2019 and fairly similar to 01/11/2020. Sludge in the gallbladder with gallbladder wall thickening. Minimal intrahepatic biliary dilatation with the common hepatic duct at 0.9 cm in diameter, and narrowing of the common bile duct down to about 0.3 cm in diameter in the vicinity of the pancreatic mass due to extrinsic compression. Expected tapering course of the distal most CBD on image 12 of series 5. Pancreas: Prominently dilated dorsal pancreatic duct dilated side ducts, extending to the level of the mass in the junction of the pancreatic head and body. The mass measures 3.6 by 2.7 cm on image 64 series 7 and abuts the gastric wall, portal vein, and common bile duct along with obstructing the pancreatic duct. By my measurements, this primary mass was previously 3.5 by 2.5 cm on 11/24/2019. Spleen:  Unremarkable Adrenals/Urinary Tract:  Unremarkable Stomach/Bowel: Pancreatic mass abuts the posterior wall of the stomach. Vascular/Lymphatic: Small peripancreatic lymph nodes without  overt pathologic adenopathy. Other:  No supplemental non-categorized findings. Musculoskeletal: Degenerative disc disease at L5-S1. IMPRESSION: 1. Prominently dilated dorsal pancreatic duct extending to the level of the mass in the junction of the  pancreatic head and body. The mass abuts the gastric wall, portal vein, and common bile duct, leading to mild narrowing of the common bile duct down to about 0.3 cm, with the common hepatic duct mildly prominent 0.9 cm. The mass has mildly enlarged compared to the CT examination of 11/24/2019. 2. Extensive metastatic burden throughout all lobes of the liver, substantially increased from 11/24/2019 and fairly similar to 01/11/2020. 3. Trace left pleural effusion with adjacent atelectasis in the left lower lobe. Borderline cardiomegaly. 4. Sludge in the gallbladder with gallbladder wall thickening. Electronically Signed   By: Van Clines M.D.   On: 01/17/2020 08:29   DG Abd 2 Views  Result Date: 01/04/2020 CLINICAL DATA:  Abdomen pain no bowel movement EXAM: ABDOMEN - 2 VIEW COMPARISON:  12/06/2019 FINDINGS: Minimal scarring left base. No free air beneath the diaphragm. Nonobstructed bowel-gas pattern. No radiopaque calculi. IMPRESSION: Nonobstructed bowel-gas pattern. Electronically Signed   By: Donavan Foil M.D.   On: 01/04/2020 18:34   MR ABDOMEN MRCP W WO CONTAST  Result Date: 01/17/2020 CLINICAL DATA:  Jaundice, history of pancreatic cancer EXAM: MRI ABDOMEN WITHOUT AND WITH CONTRAST (INCLUDING MRCP) TECHNIQUE: Multiplanar multisequence MR imaging of the abdomen was performed both before and after the administration of intravenous contrast. Heavily T2-weighted images of the biliary and pancreatic ducts were obtained, and three-dimensional MRCP images were rendered by post processing. CONTRAST:  17m GADAVIST GADOBUTROL 1 MMOL/ML IV SOLN COMPARISON:  Multiple exams, including 11/24/2019 FINDINGS: Please note that images for today's exam span 2 different accession numbers, with post-contrast images in 1 record and precontrast images in the other record. Lower chest: Trace left pleural effusion with adjacent atelectasis in the left lower lobe. Trace right pleural effusion. Borderline cardiomegaly. A  right lower axillary lymph node measures 0.8 cm in short axis on image 3 of series 3. Hepatobiliary: Extensive metastatic burden throughout all lobes of the liver, substantially increased from 11/24/2019 and fairly similar to 01/11/2020. Sludge in the gallbladder with gallbladder wall thickening. Minimal intrahepatic biliary dilatation with the common hepatic duct at 0.9 cm in diameter, and narrowing of the common bile duct down to about 0.3 cm in diameter in the vicinity of the pancreatic mass due to extrinsic compression. Expected tapering course of the distal most CBD on image 12 of series 5. Pancreas: Prominently dilated dorsal pancreatic duct dilated side ducts, extending to the level of the mass in the junction of the pancreatic head and body. The mass measures 3.6 by 2.7 cm on image 64 series 7 and abuts the gastric wall, portal vein, and common bile duct along with obstructing the pancreatic duct. By my measurements, this primary mass was previously 3.5 by 2.5 cm on 11/24/2019. Spleen:  Unremarkable Adrenals/Urinary Tract:  Unremarkable Stomach/Bowel: Pancreatic mass abuts the posterior wall of the stomach. Vascular/Lymphatic: Small peripancreatic lymph nodes without overt pathologic adenopathy. Other:  No supplemental non-categorized findings. Musculoskeletal: Degenerative disc disease at L5-S1. IMPRESSION: 1. Prominently dilated dorsal pancreatic duct extending to the level of the mass in the junction of the pancreatic head and body. The mass abuts the gastric wall, portal vein, and common bile duct, leading to mild narrowing of the common bile duct down to about 0.3 cm, with the common hepatic duct mildly prominent 0.9 cm. The mass has mildly  enlarged compared to the CT examination of 11/24/2019. 2. Extensive metastatic burden throughout all lobes of the liver, substantially increased from 11/24/2019 and fairly similar to 01/11/2020. 3. Trace left pleural effusion with adjacent atelectasis in the left  lower lobe. Borderline cardiomegaly. 4. Sludge in the gallbladder with gallbladder wall thickening. Electronically Signed   By: Van Clines M.D.   On: 01/17/2020 08:29   VAS [place] LOWER EXTREMITY VENOUS (DVT)  Result Date: 01/07/2020  Lower Venous DVT Study Indications: Swelling.  Risk Factors: Cancer. Limitations: Poor ultrasound/tissue interface. Comparison Study: No prior studies. Performing Technologist: Oliver Hum RVT  Examination Guidelines: A complete evaluation includes B-mode imaging, spectral Doppler, color Doppler, and power Doppler as needed of all accessible portions of each vessel. Bilateral testing is considered an integral part of a complete examination. Limited examinations for reoccurring indications may be performed as noted. The reflux portion of the exam is performed with the patient in reverse Trendelenburg.  +-----+---------------+---------+-----------+----------+--------------+ RIGHTCompressibilityPhasicitySpontaneityPropertiesThrombus Aging +-----+---------------+---------+-----------+----------+--------------+ CFV  Full           Yes      Yes                                 +-----+---------------+---------+-----------+----------+--------------+   +---------+---------------+---------+-----------+----------+--------------+ LEFT     CompressibilityPhasicitySpontaneityPropertiesThrombus Aging +---------+---------------+---------+-----------+----------+--------------+ CFV      Full           Yes      Yes                                 +---------+---------------+---------+-----------+----------+--------------+ SFJ      Full                                                        +---------+---------------+---------+-----------+----------+--------------+ FV Prox  Full                                                        +---------+---------------+---------+-----------+----------+--------------+ FV Mid   Full                                                         +---------+---------------+---------+-----------+----------+--------------+ FV DistalFull                                                        +---------+---------------+---------+-----------+----------+--------------+ PFV      Full                                                        +---------+---------------+---------+-----------+----------+--------------+ POP  Full           Yes      Yes                                 +---------+---------------+---------+-----------+----------+--------------+ PTV      Full                                                        +---------+---------------+---------+-----------+----------+--------------+ PERO     Partial                                      Acute          +---------+---------------+---------+-----------+----------+--------------+ Gastroc  None                                         Acute          +---------+---------------+---------+-----------+----------+--------------+     Summary: RIGHT: - No evidence of common femoral vein obstruction.  LEFT: - Findings consistent with acute deep vein thrombosis involving the left peroneal veins, and left gastrocnemius veins. - No cystic structure found in the popliteal fossa.  *See table(s) above for measurements and observations. Electronically signed by Ruta Hinds MD on 01/07/2020 at 7:41:10 PM.    Final     ASSESSMENT AND PLAN: 1. Metastatic pancreatic cancer 2. Transaminitis, improved 3. Anemia 4. History of DVT and recent diagnosis of pulmonary emboli 5. Lupus 6. Hypertension 7. Migraines 8. Anxiety  -The patient is currently receiving single agent gemcitabine for treatment of her metastatic pancreatic cancer. She had poor tolerance to FOLFIRINOX. Continues to have some difficulty tolerating chemotherapy. Plan is to add Abraxane if she tolerates single agent Gemzar. Unclear if she will tolerate addition of Abraxane. The  patient has expressed that she like to continue to treat her pancreatic cancer aggressively and does not want to stop chemotherapy. Palliative care consult pending to assist with goals of care discussion. Will also discuss further as an outpatient. -MRCP results discussed with patient. She is aware that there is concern for disease progression in her liver. Will readdress treatment options when her primary oncologist is back in the office the week of 01/28/2020. -Transaminitis improved. Likely due to liver metastases. No plans for ERCP at this time. -Hemoglobin is 7.2 today. Will transfuse 1 unit PRBCs today. Recommend PRBC transfusion to keep hemoglobin above 8. -Continue Lovenox. -Guaifenesin ordered as needed for cough. -Outpatient follow-up as already scheduled at the cancer center on 01/29/2020. The patient should keep this appointment as scheduled.   LOS: 2 days   Mikey Bussing, DNP, AGPCNP-BC, AOCNP 01/17/20

## 2020-01-18 LAB — COMPREHENSIVE METABOLIC PANEL
ALT: 131 U/L — ABNORMAL HIGH (ref 0–44)
AST: 107 U/L — ABNORMAL HIGH (ref 15–41)
Albumin: 2.3 g/dL — ABNORMAL LOW (ref 3.5–5.0)
Alkaline Phosphatase: 352 U/L — ABNORMAL HIGH (ref 38–126)
Anion gap: 10 (ref 5–15)
BUN: 15 mg/dL (ref 6–20)
CO2: 25 mmol/L (ref 22–32)
Calcium: 8.6 mg/dL — ABNORMAL LOW (ref 8.9–10.3)
Chloride: 106 mmol/L (ref 98–111)
Creatinine, Ser: 0.74 mg/dL (ref 0.44–1.00)
GFR, Estimated: >60 mL/min (ref >60)
Glucose, Bld: 75 mg/dL (ref 70–99)
Potassium: 3.2 mmol/L — ABNORMAL LOW (ref 3.5–5.1)
Sodium: 141 mmol/L (ref 135–145)
Total Bilirubin: 1.2 mg/dL (ref 0.3–1.2)
Total Protein: 6.6 g/dL (ref 6.5–8.1)

## 2020-01-18 LAB — BPAM RBC
Blood Product Expiration Date: 202201222359
ISSUE DATE / TIME: 202112231247
Unit Type and Rh: 1700

## 2020-01-18 LAB — TYPE AND SCREEN
ABO/RH(D): B NEG
Antibody Screen: NEGATIVE
Unit division: 0

## 2020-01-18 LAB — CBC
HCT: 28 % — ABNORMAL LOW (ref 36.0–46.0)
Hemoglobin: 8.9 g/dL — ABNORMAL LOW (ref 12.0–15.0)
MCH: 29.8 pg (ref 26.0–34.0)
MCHC: 31.8 g/dL (ref 30.0–36.0)
MCV: 93.6 fL (ref 80.0–100.0)
Platelets: 230 10*3/uL (ref 150–400)
RBC: 2.99 MIL/uL — ABNORMAL LOW (ref 3.87–5.11)
RDW: 16.9 % — ABNORMAL HIGH (ref 11.5–15.5)
WBC: 5.3 10*3/uL (ref 4.0–10.5)
nRBC: 0 % (ref 0.0–0.2)

## 2020-01-18 MED ORDER — HEPARIN SOD (PORK) LOCK FLUSH 100 UNIT/ML IV SOLN
500.0000 [IU] | INTRAVENOUS | Status: AC | PRN
  Administered 2020-01-18: 500 [IU]
  Filled 2020-01-18: qty 5

## 2020-01-18 MED ORDER — POTASSIUM CHLORIDE CRYS ER 20 MEQ PO TBCR
40.0000 meq | EXTENDED_RELEASE_TABLET | ORAL | Status: DC
  Administered 2020-01-18: 40 meq via ORAL
  Filled 2020-01-18: qty 2

## 2020-01-18 NOTE — TOC Transition Note (Addendum)
Transition of Care [facility]) - CM/SW Discharge Note   Patient Details  Name: [name] MRN: [id number] Date of Birth: April 25, 1960  [facility] CM/SW Contact:  Dessa Phi, RN Phone Number: 01/18/2020, 10:18 AM   Clinical Narrative: d/c home. Patient decided to get shower chair on own. No further CM needs.   11a-Received call from [employer] rep Pam infusion coordinator-conerns on TPN resumption HHRN orders-per attending-patient was not on TPN while in the inpatient setting-HHRN order put in to f/u w/Dr. Burr Medico. No further CM needs.    Final next level of care: Home/Self Care Barriers to Discharge: No Barriers Identified   Patient Goals and CMS Choice Patient states their goals for this hospitalization and ongoing recovery are:: go home      Discharge Placement                       Discharge Plan and Services   Discharge Planning Services: CM Consult                                 Social Determinants of Health (SDOH) Interventions     Readmission Risk Interventions Readmission Risk Prevention Plan 12/11/2019 11/27/2019  Transportation Screening - Complete  HRI or [place] - Complete  Social Work Consult for [place] Planning/Counseling - Complete  Palliative Care Screening - Not Applicable  Medication Review [employer] photographer) - Complete  PCP or Specialist appointment within 3-5 days of discharge Complete -  [place] or Home Care Consult Complete -  SW Recovery Care/Counseling Consult Complete -  Palliative Care Screening Not Applicable -  [place] Not Applicable -  Some recent data might be hidden

## 2020-01-18 NOTE — Discharge Summary (Signed)
Physician Discharge Summary  [name] [id number] DOB: 1960/08/14 DOA: 01/15/2020  PCP: Josetta Huddle, MD  Admit date: 01/15/2020 Discharge date: 01/18/2020  Admitted From: Home Disposition:  Home  Recommendations for Outpatient Follow-up:  1. Follow up with PCP in 1 week 2. Follow up with Dr. Burr Medico oncology as scheduled on 01/29/20 3. Follow-up with [facility] as needed 4. Please obtain LFT in 1 week  Discharge Condition: Stable CODE STATUS: Full code Diet recommendation:  Diet Orders (From admission, onward)    Start     Ordered   01/17/20 0853  DIET SOFT Room service appropriate? Yes; Fluid consistency: Thin  Diet effective now       Question Answer Comment  Room service appropriate? Yes   Fluid consistency: Thin      01/17/20 [id number]         Brief/Interim Summary: Cynthia Frye is a 59 y.o.femalewith medical history significant ofpancreatic cancer currently on chemotherapy, asthma, degenerative disc disease, hypertension, lupus, migraine headaches who was supposed to have chemotherapy on 12/21 and went to the cancer center. She has been having persistent nausea, vomiting, and constant abdominal pain. Patient has been on chronic antiemetics. She was seen pre-chemo and found to have worsening liver function test. There was concern for biliary obstruction. Her pain was rated as 6 out of 10. No significant change from her previous level of pain. Due to worsening liver function however recommendation was to admit the patient for evaluation of biliary obstruction with GI evaluation. Patient noted to have fever with temperature up to 101. No other symptoms.   GI was consulted and patient underwent MRCP which revealed dilated pancreatic duct.  There was no need for urgent/emergent ERCP.  LFTs continue to improve slowly.  She was also evaluated by oncology as well as palliative care medicine during her hospital stay.  Due to fever, blood culture was obtained which was  negative to date.  She was monitored off of antibiotics without any further fevers.  She was also given 1 unit of packed red blood cell for symptomatic anemia during hospital stay.  On day of discharge, patient was tolerating soft diet without any further nausea or vomiting or worsening abdominal pain.  Discharge Diagnoses:  Principal Problem:   Biliary obstruction Active Problems:   Essential hypertension   Pancreatic cancer (HCC)   Nausea and vomiting in adult   Anemia associated with chemotherapy    Metastatic pancreatic cancer with concern for worsening disease process -Ultrasound abdomen: Gallbladder sludge versus cholelithiasis with no associated acute cholecystitis or choledocholithiasis -MRCP: dilated pancreatic duct as well as enlarging mass at the junction of pancreatic head and body. Mass abuts the common bile duct leading to mild narrowing of CBD of 0.3 cm -GI signed off 12/23: No need for urgent/emergent ERCP, abnormal LFTs thought to be secondary to worsening liver metastasis.  Follow-up as outpatient -Follows with Dr. Burr Medico: As outpatient appointment scheduled for 01/29/20   -Continue to trend LFT, trending down today   SIRS without source of infection found  -Presented with tachycardia heart rate 130, fever T-max 102.7, tachypnea respiratory rate 21 -Blood cultures negative to date  -Influenza, Covid negative -No signs of cholangitis.  Stop antibiotic today and monitor  Symptomatic anemia -Transfused 1u pRBC 12/23 -Hemoglobin 8.9 this morning  Recent diagnosis of DVT -Continue Lovenox  Goals of care -Appreciate palliative care medicine  Lupus -Continue Plaquenil  Essential hypertension -Continue Toprol.  Patient's HCTZ, losartan were recently discontinued after hospitalization in October  2021  Hypokalemia -Replaced    Discharge Instructions  Discharge Instructions    Call MD for:  difficulty breathing, headache or visual disturbances   Complete  by: As directed    Call MD for:  extreme fatigue   Complete by: As directed    Call MD for:  persistant dizziness or light-headedness   Complete by: As directed    Call MD for:  persistant nausea and vomiting   Complete by: As directed    Call MD for:  severe uncontrolled pain   Complete by: As directed    Call MD for:  temperature >100.4   Complete by: As directed    Discharge instructions   Complete by: As directed    You were cared for by a hospitalist during your hospital stay. If you have any questions about your discharge medications or the care you received while you were in the hospital after you are discharged, you can call the unit and ask to speak with the hospitalist on call if the hospitalist that took care of you is not available. Once you are discharged, your primary care physician will handle any further medical issues. Please note that NO REFILLS for any discharge medications will be authorized once you are discharged, as it is imperative that you return to your primary care physician (or establish a relationship with a primary care physician if you do not have one) for your aftercare needs so that they can reassess your need for medications and monitor your lab values.   Increase activity slowly   Complete by: As directed      Allergies as of 01/18/2020      Reactions   Cinnamon Other (See Comments)   Other reaction(s): Other (See Comments) Unknown  On allergy test Unknown  On allergy test   Peanut-containing Drug Products Hives   Prednisone Other (See Comments)   Interacts with another medicine she is taking   Corn Oil Hives   Corn-containing Products Hives   Other Rash   Red grapefruit and naval oranges- lips tingling and facial rash Potatoes, tomatoes, garlic, oregano/basil caused headache Other reaction(s): migratory headache      Medication List    STOP taking these medications   polyethylene glycol 17 g packet Commonly known as: MIRALAX / GLYCOLAX    potassium chloride 20 MEQ/15ML (10%) Soln     TAKE these medications   dexamethasone 4 MG tablet Commonly known as: DECADRON Take 1 tablet (4 mg total) by mouth daily. Take 1 tablet once daily for 3 to 5 days after chemo   diclofenac 75 MG EC tablet Commonly known as: VOLTAREN Take 1 tablet (75 mg total) by mouth 2 (two) times daily as needed. What changed: when to take this   diphenoxylate-atropine 2.5-0.025 MG tablet Commonly known as: LOMOTIL Take 2 tablets by mouth 4 (four) times daily as needed for diarrhea or loose stools (use 2nd, if Imodium doesn't work. Max dose: 8 tablets/day.). What changed: reasons to take this   dronabinol 2.5 MG capsule Commonly known as: MARINOL Take 1 capsule (2.5 mg total) by mouth 2 (two) times daily before a meal. What changed:   when to take this  reasons to take this   enoxaparin 80 MG/0.8ML injection Commonly known as: Lovenox Inject 0.8 mLs (80 mg total) into the skin every 12 (twelve) hours.   feeding supplement Liqd Take 237 mLs by mouth 2 (two) times daily between meals.   ferrous sulfate 325 (65 FE) MG tablet  Take 325 mg by mouth daily with breakfast.   fluticasone 50 MCG/ACT nasal spray Commonly known as: FLONASE Place 2 sprays into both nostrils daily. What changed:   when to take this  reasons to take this   hydroxychloroquine 200 MG tablet Commonly known as: PLAQUENIL Take 400 mg by mouth daily.   loperamide 2 MG capsule Commonly known as: IMODIUM Take 2 capsules (4 mg total) by mouth 2 (two) times daily as needed for diarrhea or loose stools (Use 1st).   LORazepam 0.5 MG tablet Commonly known as: ATIVAN Take 1 tablet (0.5 mg total) by mouth every 8 (eight) hours as needed for anxiety (and anticiaptory n/v. DO NOT TAKE WITH XANAX).   metoprolol succinate 50 MG 24 hr tablet Commonly known as: TOPROL-XL Take 50 mg by mouth daily.   mirtazapine 7.5 MG tablet Commonly known as: REMERON Take 1 tablet (7.5 mg  total) by mouth at bedtime.   OLANZapine 7.5 MG tablet Commonly known as: ZYPREXA Take 1 tablet (7.5 mg total) by mouth at bedtime.   ondansetron 8 MG tablet Commonly known as: ZOFRAN Take 1 tablet (8 mg total) by mouth every 8 (eight) hours as needed for nausea or vomiting.   oxyCODONE 5 MG immediate release tablet Commonly known as: Oxy IR/ROXICODONE Take 1 tablet (5 mg total) by mouth every 6 (six) hours as needed for severe pain.   Pancrelipase (Lip-Prot-Amyl) 24000-76000 units Cpep Take 1 capsule (24,000 Units total) by mouth 3 (three) times daily with meals.   Rheumate Caps Take 1 capsule by mouth daily.   senna-docusate 8.6-50 MG tablet Commonly known as: Senokot-S Take 1 tablet by mouth 2 (two) times daily. What changed:   when to take this  reasons to take this   sucralfate 1 GM/10ML suspension Commonly known as: CARAFATE Take 10 mLs (1 g total) by mouth 4 (four) times daily -  with meals and at bedtime.   topiramate 50 MG tablet Commonly known as: Topamax Take 1 tablet (50 mg total) by mouth at bedtime. What changed:   when to take this  reasons to take this   Vitamin D 50 MCG (2000 UT) Caps Take 2,000 Units by mouth daily.       Follow-up Information    Josetta Huddle, MD Follow up.   Specialty: Internal Medicine Contact information: [address] [place] [address] [phone]        Truitt Merle, MD. Go on 01/29/2020.   Specialties: Hematology, Oncology Contact information: [place] [id number]        Otis Brace, MD Follow up.   Specialty: Gastroenterology Why: Call with questions, follow up as needed  Contact information: [address] [place] [address] [phone]              Allergies  Allergen Reactions  . Cinnamon Other (See Comments)    Other reaction(s): Other (See Comments) Unknown  On allergy test Unknown  On allergy test  .  Peanut-Containing Drug Products Hives  . Prednisone Other (See Comments)    Interacts with another medicine she is taking  . Corn Oil Hives  . Corn-Containing Products Hives  . Other Rash    Red grapefruit and naval oranges- lips tingling and facial rash Potatoes, tomatoes, garlic, oregano/basil caused headache Other reaction(s): migratory headache    Consultations:  GI  Oncology  Palliative care   Procedures/Studies: CT ANGIO CHEST PE W OR WO CONTRAST  Result Date: 01/11/2020 CLINICAL DATA:  Pancreatic cancer. Shortness of breath. Positive D-dimer. EXAM: CT ANGIOGRAPHY CHEST WITH CONTRAST TECHNIQUE: Multidetector CT imaging of the chest was performed using the standard protocol during bolus administration of intravenous contrast. Multiplanar CT image reconstructions and MIPs were obtained to evaluate the vascular anatomy. CONTRAST:  19mL OMNIPAQUE IOHEXOL 350 MG/ML SOLN COMPARISON:  10/15/2019 FINDINGS: Cardiovascular: Pulmonary arterial opacification is satisfactory. No emboli are seen on the left. There are numerous pulmonary emboli present on the right. No large clot burden. No sign of right heart strain. Mediastinum/Nodes: No mediastinal or hilar mass or lymphadenopathy. Lungs/Pleura: Lungs are well aerated. No infiltrate, collapse or effusion. No evidence of pulmonary metastatic disease. Upper Abdomen: Widespread metastatic disease throughout the liver, probably progressive since September. Musculoskeletal: Negative Bilateral axillary lymphadenopathy, similar to the study of September. Review of the MIP images confirms the above findings. IMPRESSION: 1. Numerous but small pulmonary emboli on the right. No large clot burden or evidence of right heart strain. 2. Widespread hepatic metastatic disease, probably progressive since September. 3. These results will be called to the ordering clinician or representative by the Radiologist Assistant, and communication documented in the PACS or  [employer]. Electronically Signed   By: Nelson Chimes M.D.   On: 01/11/2020 16:35   US Abdomen Complete  Result Date: 01/15/2020 CLINICAL DATA:  Acetic pancreatic cancer to the liver. New hyperbilirubinemia. Rule out biliary obstruction EXAM: ABDOMEN ULTRASOUND COMPLETE COMPARISON:  Ultrasound abdomen 10/30/2019 FINDINGS: Gallbladder: Sludge versus tiny gallstones within the gallbladder lumen. No sonographic Murphy sign noted by sonographer. Common bile duct: Diameter: 6 mm Liver: Innumerable hypodense solid hepatic masses within bilateral hepatic lobes with the largest measuring 2.9 x 2.6 x 3 cm. Remainder of the hepatic demonstrates normal parenchymal echogenicity. Portal vein is patent on color Doppler imaging with normal direction of blood flow towards the liver. IVC: No abnormality visualized. Pancreas: Not visualized due to gaseous abdomen. Spleen: Size and appearance within normal limits. Right Kidney: Length: 11.9 cm. Echogenicity within normal limits. No mass or hydronephrosis visualized. Left Kidney: Length: 11.8 cm. Echogenicity within normal limits. No mass or hydronephrosis visualized. Abdominal aorta: No aneurysm visualized. Other findings: None. IMPRESSION: 1. Gallbladder sludge versus cholelithiasis with no associated acute cholecystitis or choledocholithiasis. 2. Innumerable hepatic metastases. Electronically Signed   By: Iven Finn M.D.   On: 01/15/2020 17:36   MR 3D Recon At Scanner  Result Date: 01/17/2020 CLINICAL DATA:  Jaundice, history of pancreatic cancer EXAM: MRI ABDOMEN WITHOUT AND WITH CONTRAST (INCLUDING MRCP) TECHNIQUE: Multiplanar multisequence MR imaging of the abdomen was performed both before and after the administration of intravenous contrast. Heavily T2-weighted images of the biliary and pancreatic ducts were obtained, and three-dimensional MRCP images were rendered by post processing. CONTRAST:  56mL GADAVIST GADOBUTROL 1 MMOL/ML IV SOLN COMPARISON:  Multiple  exams, including 11/24/2019 FINDINGS: Please note that images for today's exam span 2 different accession numbers, with post-contrast images in 1 record and precontrast images in the other record. Lower chest: Trace left pleural effusion with adjacent atelectasis in the left lower lobe. Trace right pleural effusion. Borderline cardiomegaly. A right lower axillary lymph node measures 0.8 cm in short axis on image 3 of series 3. Hepatobiliary: Extensive metastatic burden throughout all lobes of the liver, substantially increased from 11/24/2019 and fairly similar to 01/11/2020. Sludge in the gallbladder with gallbladder wall thickening. Minimal intrahepatic biliary dilatation with the common hepatic duct at 0.9 cm in diameter, and narrowing of the common bile  duct down to about 0.3 cm in diameter in the vicinity of the pancreatic mass due to extrinsic compression. Expected tapering course of the distal most CBD on image 12 of series 5. Pancreas: Prominently dilated dorsal pancreatic duct dilated side ducts, extending to the level of the mass in the junction of the pancreatic head and body. The mass measures 3.6 by 2.7 cm on image 64 series 7 and abuts the gastric wall, portal vein, and common bile duct along with obstructing the pancreatic duct. By my measurements, this primary mass was previously 3.5 by 2.5 cm on 11/24/2019. Spleen:  Unremarkable Adrenals/Urinary Tract:  Unremarkable Stomach/Bowel: Pancreatic mass abuts the posterior wall of the stomach. Vascular/Lymphatic: Small peripancreatic lymph nodes without overt pathologic adenopathy. Other:  No supplemental non-categorized findings. Musculoskeletal: Degenerative disc disease at L5-S1. IMPRESSION: 1. Prominently dilated dorsal pancreatic duct extending to the level of the mass in the junction of the pancreatic head and body. The mass abuts the gastric wall, portal vein, and common bile duct, leading to mild narrowing of the common bile duct down to about 0.3  cm, with the common hepatic duct mildly prominent 0.9 cm. The mass has mildly enlarged compared to the CT examination of 11/24/2019. 2. Extensive metastatic burden throughout all lobes of the liver, substantially increased from 11/24/2019 and fairly similar to 01/11/2020. 3. Trace left pleural effusion with adjacent atelectasis in the left lower lobe. Borderline cardiomegaly. 4. Sludge in the gallbladder with gallbladder wall thickening. Electronically Signed   By: Van Clines M.D.   On: 01/17/2020 08:29   DG Abd 2 Views  Result Date: 01/04/2020 CLINICAL DATA:  Abdomen pain no bowel movement EXAM: ABDOMEN - 2 VIEW COMPARISON:  12/06/2019 FINDINGS: Minimal scarring left base. No free air beneath the diaphragm. Nonobstructed bowel-gas pattern. No radiopaque calculi. IMPRESSION: Nonobstructed bowel-gas pattern. Electronically Signed   By: Donavan Foil M.D.   On: 01/04/2020 18:34   MR ABDOMEN MRCP W WO CONTAST  Result Date: 01/17/2020 CLINICAL DATA:  Jaundice, history of pancreatic cancer EXAM: MRI ABDOMEN WITHOUT AND WITH CONTRAST (INCLUDING MRCP) TECHNIQUE: Multiplanar multisequence MR imaging of the abdomen was performed both before and after the administration of intravenous contrast. Heavily T2-weighted images of the biliary and pancreatic ducts were obtained, and three-dimensional MRCP images were rendered by post processing. CONTRAST:  66mL GADAVIST GADOBUTROL 1 MMOL/ML IV SOLN COMPARISON:  Multiple exams, including 11/24/2019 FINDINGS: Please note that images for today's exam span 2 different accession numbers, with post-contrast images in 1 record and precontrast images in the other record. Lower chest: Trace left pleural effusion with adjacent atelectasis in the left lower lobe. Trace right pleural effusion. Borderline cardiomegaly. A right lower axillary lymph node measures 0.8 cm in short axis on image 3 of series 3. Hepatobiliary: Extensive metastatic burden throughout all lobes of the  liver, substantially increased from 11/24/2019 and fairly similar to 01/11/2020. Sludge in the gallbladder with gallbladder wall thickening. Minimal intrahepatic biliary dilatation with the common hepatic duct at 0.9 cm in diameter, and narrowing of the common bile duct down to about 0.3 cm in diameter in the vicinity of the pancreatic mass due to extrinsic compression. Expected tapering course of the distal most CBD on image 12 of series 5. Pancreas: Prominently dilated dorsal pancreatic duct dilated side ducts, extending to the level of the mass in the junction of the pancreatic head and body. The mass measures 3.6 by 2.7 cm on image 64 series 7 and abuts the gastric wall, portal vein,  and common bile duct along with obstructing the pancreatic duct. By my measurements, this primary mass was previously 3.5 by 2.5 cm on 11/24/2019. Spleen:  Unremarkable Adrenals/Urinary Tract:  Unremarkable Stomach/Bowel: Pancreatic mass abuts the posterior wall of the stomach. Vascular/Lymphatic: Small peripancreatic lymph nodes without overt pathologic adenopathy. Other:  No supplemental non-categorized findings. Musculoskeletal: Degenerative disc disease at L5-S1. IMPRESSION: 1. Prominently dilated dorsal pancreatic duct extending to the level of the mass in the junction of the pancreatic head and body. The mass abuts the gastric wall, portal vein, and common bile duct, leading to mild narrowing of the common bile duct down to about 0.3 cm, with the common hepatic duct mildly prominent 0.9 cm. The mass has mildly enlarged compared to the CT examination of 11/24/2019. 2. Extensive metastatic burden throughout all lobes of the liver, substantially increased from 11/24/2019 and fairly similar to 01/11/2020. 3. Trace left pleural effusion with adjacent atelectasis in the left lower lobe. Borderline cardiomegaly. 4. Sludge in the gallbladder with gallbladder wall thickening. Electronically Signed   By: Van Clines M.D.   On:  01/17/2020 08:29   VAS [place] LOWER EXTREMITY VENOUS (DVT)  Result Date: 01/07/2020  Lower Venous DVT Study Indications: Swelling.  Risk Factors: Cancer. Limitations: Poor ultrasound/tissue interface. Comparison Study: No prior studies. Performing Technologist: Oliver Hum RVT  Examination Guidelines: A complete evaluation includes B-mode imaging, spectral Doppler, color Doppler, and power Doppler as needed of all accessible portions of each vessel. Bilateral testing is considered an integral part of a complete examination. Limited examinations for reoccurring indications may be performed as noted. The reflux portion of the exam is performed with the patient in reverse Trendelenburg.  +-----+---------------+---------+-----------+----------+--------------+ RIGHTCompressibilityPhasicitySpontaneityPropertiesThrombus Aging +-----+---------------+---------+-----------+----------+--------------+ CFV  Full           Yes      Yes                                 +-----+---------------+---------+-----------+----------+--------------+   +---------+---------------+---------+-----------+----------+--------------+ LEFT     CompressibilityPhasicitySpontaneityPropertiesThrombus Aging +---------+---------------+---------+-----------+----------+--------------+ CFV      Full           Yes      Yes                                 +---------+---------------+---------+-----------+----------+--------------+ SFJ      Full                                                        +---------+---------------+---------+-----------+----------+--------------+ FV Prox  Full                                                        +---------+---------------+---------+-----------+----------+--------------+ FV Mid   Full                                                        +---------+---------------+---------+-----------+----------+--------------+ FV DistalFull                                                         +---------+---------------+---------+-----------+----------+--------------+  PFV      Full                                                        +---------+---------------+---------+-----------+----------+--------------+ POP      Full           Yes      Yes                                 +---------+---------------+---------+-----------+----------+--------------+ PTV      Full                                                        +---------+---------------+---------+-----------+----------+--------------+ PERO     Partial                                      Acute          +---------+---------------+---------+-----------+----------+--------------+ Gastroc  None                                         Acute          +---------+---------------+---------+-----------+----------+--------------+     Summary: RIGHT: - No evidence of common femoral vein obstruction.  LEFT: - Findings consistent with acute deep vein thrombosis involving the left peroneal veins, and left gastrocnemius veins. - No cystic structure found in the popliteal fossa.  *See table(s) above for measurements and observations. Electronically signed by Ruta Hinds MD on 01/07/2020 at 7:41:10 PM.    Final        Discharge Exam: Vitals:   01/17/20 2231 01/18/20 0700  BP: 123/73 130/76  Pulse: 86 90  Resp: 20 18  Temp: 98.5 F (36.9 Cynthia) 98.5 F (36.9 Cynthia)  SpO2: 97% 99%    General: Pt is alert, awake, not in acute distress Cardiovascular: RRR, S1/S2 +, no edema Respiratory: CTA bilaterally, no wheezing, no rhonchi, no respiratory distress, no conversational dyspnea  Abdominal: Soft, NT, ND, bowel sounds + Extremities: no edema, no cyanosis Psych: Normal mood and affect, stable judgement and insight     The results of significant diagnostics from this hospitalization (including imaging, microbiology, ancillary and laboratory) are listed below for reference.      Microbiology: Recent Results (from the past 240 hour(s))  Resp Panel by RT-PCR (Flu A&B, Covid) Nasopharyngeal Swab     Status: None   Collection Time: 01/15/20  5:42 PM   Specimen: Nasopharyngeal Swab; Nasopharyngeal(NP) swabs in vial transport medium  Result Value Ref Range Status   SARS Coronavirus 2 by RT PCR NEGATIVE NEGATIVE Final    Comment: (NOTE) SARS-CoV-2 target nucleic acids are NOT DETECTED.  The SARS-CoV-2 RNA is generally detectable in upper respiratory specimens during the acute phase of infection. The lowest concentration of SARS-CoV-2 viral copies this assay can detect is 138 copies/mL. A negative result does not preclude SARS-Cov-2 infection and should not be used as the sole basis for  treatment or other patient management decisions. A negative result may occur with  improper specimen collection/handling, submission of specimen other than nasopharyngeal swab, presence of viral mutation(s) within the areas targeted by this assay, and inadequate number of viral copies(<138 copies/mL). A negative result must be combined with clinical observations, patient history, and epidemiological information. The expected result is Negative.  Fact Sheet for Patients:  EntrepreneurPulse.com.au  Fact Sheet for Healthcare Providers:  IncredibleEmployment.be  This test is no t yet approved or cleared by the [place] FDA and  has been authorized for detection and/or diagnosis of SARS-CoV-2 by FDA under an Emergency Use Authorization (EUA). This EUA will remain  in effect (meaning this test can be used) for the duration of the COVID-19 declaration under Section 564(b)(1) of the Act, 21 U.S.[place].section 360bbb-3(b)(1), unless the authorization is terminated  or revoked sooner.       Influenza A by PCR NEGATIVE NEGATIVE Final   Influenza B by PCR NEGATIVE NEGATIVE Final    Comment: (NOTE) The Xpert Xpress SARS-CoV-2/FLU/RSV plus assay is  intended as an aid in the diagnosis of influenza from Nasopharyngeal swab specimens and should not be used as a sole basis for treatment. Nasal washings and aspirates are unacceptable for Xpert Xpress SARS-CoV-2/FLU/RSV testing.  Fact Sheet for Patients: EntrepreneurPulse.com.au  Fact Sheet for Healthcare Providers: IncredibleEmployment.be  This test is not yet approved or cleared by the [place] FDA and has been authorized for detection and/or diagnosis of SARS-CoV-2 by FDA under an Emergency Use Authorization (EUA). This EUA will remain in effect (meaning this test can be used) for the duration of the COVID-19 declaration under Section 564(b)(1) of the Act, 21 U.S.[place]. section 360bbb-3(b)(1), unless the authorization is terminated or revoked.  Performed at [facility], [place] [address] [place], [place] [address]   Blood culture (routine x 2)     Status: None (Preliminary result)   Collection Time: 01/15/20  6:00 PM   Specimen: BLOOD LEFT FOREARM  Result Value Ref Range Status   Specimen Description   Final    BLOOD LEFT FOREARM Performed at [facility] [address], [place], [place] [address]    Special Requests   Final    BOTTLES DRAWN AEROBIC AND ANAEROBIC Blood Culture adequate volume Performed at [facility] [address], [place], [place] [address]    Culture   Final    NO GROWTH 2 DAYS Performed at [facility] [address] [place], [place] [address]    Report Status PENDING  Incomplete  Blood culture (routine x 2)     Status: None (Preliminary result)   Collection Time: 01/15/20  6:00 PM   Specimen: BLOOD LEFT HAND  Result Value Ref Range Status   Specimen Description   Final    BLOOD LEFT HAND Performed at [facility] [address] [place], [place] [address]    Special Requests   Final    BOTTLES DRAWN AEROBIC AND ANAEROBIC  Blood Culture adequate volume Performed at [facility] [address], [place], [place] [address]    Culture   Final    NO GROWTH 2 DAYS Performed at [facility] [address] [place],  [address]    Report Status PENDING  Incomplete     Labs: BNP (last 3 results) No results for input(s): BNP in the last 8760 hours. Basic Metabolic Panel: Recent Labs  Lab 01/15/20 1318 01/15/20 2228  01/16/20 0541 01/17/20 0341 01/18/20 0408  NA 136  --  134* 139 141  K 4.1  --  4.0 3.8 3.2*  CL 101  --  102 105 106  CO2 25  --  23 25 25   GLUCOSE 115*  --  97 103* 75  BUN 20  --  14 17 15   CREATININE 0.74 0.57 0.66 0.84 0.74  CALCIUM 9.1  --  8.8* 8.8* 8.6*   Liver Function Tests: Recent Labs  Lab 01/15/20 1318 01/16/20 0541 01/17/20 0341 01/18/20 0408  AST 200* 197* 126* 107*  ALT 273* 231* 159* 131*  ALKPHOS 468* 377* 298* 352*  BILITOT 1.6* 1.7* 0.9 1.2  PROT 8.6* 7.7 6.7 6.6  ALBUMIN 2.6* 2.8* 2.3* 2.3*   Recent Labs  Lab 01/15/20 2228  LIPASE 21   No results for input(s): AMMONIA in the last 168 hours. CBC: Recent Labs  Lab 01/15/20 1318 01/15/20 2043 01/16/20 0541 01/17/20 0341 01/18/20 0408  WBC 8.8 8.1 8.9 7.0 5.3  NEUTROABS 6.1  --   --   --   --   HGB 9.7* 8.4* 9.3* 7.2* 8.9*  HCT 29.5* 26.4* 29.4* 23.3* 28.0*  MCV 90.5 93.6 95.1 96.3 93.6  PLT 242 189 201 193 230   Cardiac Enzymes: No results for input(s): CKTOTAL, CKMB, CKMBINDEX, TROPONINI in the last 168 hours. BNP: Invalid input(s): POCBNP CBG: No results for input(s): GLUCAP in the last 168 hours. D-Dimer No results for input(s): DDIMER in the last 72 hours. Hgb A1c No results for input(s): HGBA1C in the last 72 hours. Lipid Profile No results for input(s): CHOL, HDL, LDLCALC, TRIG, CHOLHDL, LDLDIRECT in the last 72 hours. Thyroid function studies No results for input(s): TSH, T4TOTAL, T3FREE, THYROIDAB in the last 72 hours.  Invalid input(s):  FREET3 Anemia work up No results for input(s): VITAMINB12, FOLATE, FERRITIN, TIBC, IRON, RETICCTPCT in the last 72 hours. Urinalysis    Component Value Date/Time   COLORURINE YELLOW 11/24/2019 1832   APPEARANCEUR CLEAR 11/24/2019 1832   LABSPEC 1.025 11/24/2019 1832   PHURINE 7.0 11/24/2019 1832   GLUCOSEU NEGATIVE 11/24/2019 1832   HGBUR NEGATIVE 11/24/2019 1832   BILIRUBINUR NEGATIVE 11/24/2019 1832   KETONESUR 20 (A) 11/24/2019 1832   PROTEINUR NEGATIVE 11/24/2019 1832   NITRITE NEGATIVE 11/24/2019 1832   LEUKOCYTESUR MODERATE (A) 11/24/2019 1832   Sepsis Labs Invalid input(s): PROCALCITONIN,  WBC,  LACTICIDVEN Microbiology Recent Results (from the past 240 hour(s))  Resp Panel by RT-PCR (Flu A&B, Covid) Nasopharyngeal Swab     Status: None   Collection Time: 01/15/20  5:42 PM   Specimen: Nasopharyngeal Swab; Nasopharyngeal(NP) swabs in vial transport medium  Result Value Ref Range Status   SARS Coronavirus 2 by RT PCR NEGATIVE NEGATIVE Final    Comment: (NOTE) SARS-CoV-2 target nucleic acids are NOT DETECTED.  The SARS-CoV-2 RNA is generally detectable in upper respiratory specimens during the acute phase of infection. The lowest concentration of SARS-CoV-2 viral copies this assay can detect is 138 copies/mL. A negative result does not preclude SARS-Cov-2 infection and should not be used as the sole basis for treatment or other patient management decisions. A negative result may occur with  improper specimen collection/handling, submission of specimen other than nasopharyngeal swab, presence of viral mutation(s) within the areas targeted by this assay, and inadequate number of viral copies(<138 copies/mL). A negative result must be combined with clinical observations, patient history, and epidemiological information. The expected result is Negative.  Fact Sheet for Patients:  EntrepreneurPulse.com.au  Fact Sheet for Healthcare Providers:   IncredibleEmployment.be  This test is no t yet approved or cleared by the [place] FDA and  has been authorized for detection and/or diagnosis of SARS-CoV-2 by FDA under an Emergency Use Authorization (EUA). This EUA will remain  in effect (meaning this test can be used) for the duration of the COVID-19 declaration under Section 564(b)(1) of the Act, 21 U.S.[place].section 360bbb-3(b)(1), unless the authorization is terminated  or revoked sooner.       Influenza A by PCR NEGATIVE NEGATIVE Final   Influenza B by PCR NEGATIVE NEGATIVE Final    Comment: (NOTE) The Xpert Xpress SARS-CoV-2/FLU/RSV plus assay is intended as an aid in the diagnosis of influenza from Nasopharyngeal swab specimens and should not be used as a sole basis for treatment. Nasal washings and aspirates are unacceptable for Xpert Xpress SARS-CoV-2/FLU/RSV testing.  Fact Sheet for Patients: EntrepreneurPulse.com.au  Fact Sheet for Healthcare Providers: IncredibleEmployment.be  This test is not yet approved or cleared by the [place] FDA and has been authorized for detection and/or diagnosis of SARS-CoV-2 by FDA under an Emergency Use Authorization (EUA). This EUA will remain in effect (meaning this test can be used) for the duration of the COVID-19 declaration under Section 564(b)(1) of the Act, 21 U.S.[place]. section 360bbb-3(b)(1), unless the authorization is terminated or revoked.  Performed at [facility], [place] [address], [place], [place] [address]   Blood culture (routine x 2)     Status: None (Preliminary result)   Collection Time: 01/15/20  6:00 PM   Specimen: BLOOD LEFT FOREARM  Result Value Ref Range Status   Specimen Description   Final    BLOOD LEFT FOREARM Performed at [facility] [address] [place], [place] [address]    Special Requests   Final    BOTTLES DRAWN AEROBIC AND ANAEROBIC Blood  Culture adequate volume Performed at [facility] [address] [place], [place] [address]    Culture   Final    NO GROWTH 2 DAYS Performed at [facility] [address], [place], [place] [address]    Report Status PENDING  Incomplete  Blood culture (routine x 2)     Status: None (Preliminary result)   Collection Time: 01/15/20  6:00 PM   Specimen: BLOOD LEFT HAND  Result Value Ref Range Status   Specimen Description   Final    BLOOD LEFT HAND Performed at [facility] [address] [place], [place] [address]    Special Requests   Final    BOTTLES DRAWN AEROBIC AND ANAEROBIC Blood Culture adequate volume Performed at [facility] [address] [place], [place] [address]    Culture   Final    NO GROWTH 2 DAYS Performed at [facility] [address], [place], [place] [address]    Report Status PENDING  Incomplete     Patient was seen and examined on the day of discharge and was found to be in stable condition. Time coordinating discharge: 25 minutes including assessment and coordination of care, as well as examination of the patient.   SIGNED:  Dessa Phi, DO [facility] 01/18/2020, 10:05 AM

## 2020-01-18 NOTE — Plan of Care (Signed)
  Problem: Education: Goal: Knowledge of General Education information will improve Description: Including pain rating scale, medication(s)/side effects and non-pharmacologic comfort measures Outcome: Adequate for Discharge   Problem: Health Behavior/Discharge Planning: Goal: Ability to manage health-related needs will improve Outcome: Adequate for Discharge   Problem: Clinical Measurements: Goal: Ability to maintain clinical measurements within normal limits will improve Outcome: Adequate for Discharge Goal: Will remain free from infection Outcome: Adequate for Discharge Goal: Diagnostic test results will improve Outcome: Adequate for Discharge   Problem: Elimination: Goal: Will not experience complications related to bowel motility Outcome: Adequate for Discharge Goal: Will not experience complications related to urinary retention Outcome: Adequate for Discharge   Problem: Pain Managment: Goal: General experience of comfort will improve Outcome: Adequate for Discharge

## 2020-01-20 LAB — CULTURE, BLOOD (ROUTINE X 2)
Culture: NO GROWTH
Culture: NO GROWTH
Special Requests: ADEQUATE
Special Requests: ADEQUATE

## 2020-01-22 ENCOUNTER — Telehealth: Payer: Self-pay

## 2020-01-22 NOTE — Telephone Encounter (Signed)
Ms Cynthia Frye called to verify she could take senokot s and miralax for her constipation.  I verified she is not taking lomotil or imodium.  She is drinking 2 supplements per day she mixes these with 2% milk.  I confirmed her appts for 01/29/2020.  She verbalized understanding.

## 2020-01-23 ENCOUNTER — Ambulatory Visit: Payer: BC Managed Care – PPO | Admitting: Family Medicine

## 2020-01-23 DIAGNOSIS — E46 Unspecified protein-calorie malnutrition: Secondary | ICD-10-CM | POA: Diagnosis not present

## 2020-01-23 DIAGNOSIS — I82409 Acute embolism and thrombosis of unspecified deep veins of unspecified lower extremity: Secondary | ICD-10-CM | POA: Diagnosis not present

## 2020-01-23 DIAGNOSIS — C251 Malignant neoplasm of body of pancreas: Secondary | ICD-10-CM | POA: Diagnosis not present

## 2020-01-23 DIAGNOSIS — R634 Abnormal weight loss: Secondary | ICD-10-CM | POA: Diagnosis not present

## 2020-01-24 ENCOUNTER — Other Ambulatory Visit: Payer: Self-pay

## 2020-01-24 ENCOUNTER — Emergency Department (HOSPITAL_COMMUNITY)
Admission: EM | Admit: 2020-01-24 | Discharge: 2020-01-24 | Disposition: A | Discharge status: Home / Self Care | Payer: BC Managed Care – PPO | Attending: Emergency Medicine | Admitting: Emergency Medicine

## 2020-01-24 ENCOUNTER — Emergency Department (HOSPITAL_COMMUNITY): Payer: BC Managed Care – PPO

## 2020-01-24 ENCOUNTER — Encounter (HOSPITAL_COMMUNITY): Payer: Self-pay

## 2020-01-24 DIAGNOSIS — Z91010 Allergy to peanuts: Secondary | ICD-10-CM | POA: Diagnosis not present

## 2020-01-24 DIAGNOSIS — K529 Noninfective gastroenteritis and colitis, unspecified: Secondary | ICD-10-CM | POA: Diagnosis not present

## 2020-01-24 DIAGNOSIS — Z90711 Acquired absence of uterus with remaining cervical stump: Secondary | ICD-10-CM | POA: Diagnosis not present

## 2020-01-24 DIAGNOSIS — R109 Unspecified abdominal pain: Secondary | ICD-10-CM | POA: Diagnosis not present

## 2020-01-24 DIAGNOSIS — Z9861 Coronary angioplasty status: Secondary | ICD-10-CM | POA: Diagnosis not present

## 2020-01-24 DIAGNOSIS — Z7952 Long term (current) use of systemic steroids: Secondary | ICD-10-CM | POA: Insufficient documentation

## 2020-01-24 DIAGNOSIS — Z13810 Encounter for screening for upper gastrointestinal disorder: Secondary | ICD-10-CM | POA: Diagnosis not present

## 2020-01-24 DIAGNOSIS — Z79899 Other long term (current) drug therapy: Secondary | ICD-10-CM | POA: Insufficient documentation

## 2020-01-24 DIAGNOSIS — Z8507 Personal history of malignant neoplasm of pancreas: Secondary | ICD-10-CM | POA: Insufficient documentation

## 2020-01-24 DIAGNOSIS — R1084 Generalized abdominal pain: Secondary | ICD-10-CM | POA: Diagnosis not present

## 2020-01-24 DIAGNOSIS — E440 Moderate protein-calorie malnutrition: Secondary | ICD-10-CM | POA: Diagnosis not present

## 2020-01-24 DIAGNOSIS — J45909 Unspecified asthma, uncomplicated: Secondary | ICD-10-CM | POA: Diagnosis not present

## 2020-01-24 DIAGNOSIS — R112 Nausea with vomiting, unspecified: Secondary | ICD-10-CM | POA: Diagnosis not present

## 2020-01-24 DIAGNOSIS — E876 Hypokalemia: Secondary | ICD-10-CM | POA: Diagnosis not present

## 2020-01-24 DIAGNOSIS — I10 Essential (primary) hypertension: Secondary | ICD-10-CM | POA: Insufficient documentation

## 2020-01-24 DIAGNOSIS — R1310 Dysphagia, unspecified: Secondary | ICD-10-CM | POA: Diagnosis not present

## 2020-01-24 LAB — CBC
HCT: 31.6 % — ABNORMAL LOW (ref 36.0–46.0)
Hemoglobin: 10.4 g/dL — ABNORMAL LOW (ref 12.0–15.0)
MCH: 29.6 pg (ref 26.0–34.0)
MCHC: 32.9 g/dL (ref 30.0–36.0)
MCV: 90 fL (ref 80.0–100.0)
Platelets: 385 10*3/uL (ref 150–400)
RBC: 3.51 MIL/uL — ABNORMAL LOW (ref 3.87–5.11)
RDW: 17.1 % — ABNORMAL HIGH (ref 11.5–15.5)
WBC: 9.8 10*3/uL (ref 4.0–10.5)
nRBC: 0.2 % (ref 0.0–0.2)

## 2020-01-24 LAB — COMPREHENSIVE METABOLIC PANEL
ALT: 121 U/L — ABNORMAL HIGH (ref 0–44)
AST: 162 U/L — ABNORMAL HIGH (ref 15–41)
Albumin: 2.9 g/dL — ABNORMAL LOW (ref 3.5–5.0)
Alkaline Phosphatase: 510 U/L — ABNORMAL HIGH (ref 38–126)
Anion gap: 13 (ref 5–15)
BUN: 15 mg/dL (ref 6–20)
CO2: 24 mmol/L (ref 22–32)
Calcium: 8.8 mg/dL — ABNORMAL LOW (ref 8.9–10.3)
Chloride: 97 mmol/L — ABNORMAL LOW (ref 98–111)
Creatinine, Ser: 0.91 mg/dL (ref 0.44–1.00)
GFR, Estimated: >60 mL/min (ref >60)
Glucose, Bld: 100 mg/dL — ABNORMAL HIGH (ref 70–99)
Potassium: 2.8 mmol/L — ABNORMAL LOW (ref 3.5–5.1)
Sodium: 134 mmol/L — ABNORMAL LOW (ref 135–145)
Total Bilirubin: 2.3 mg/dL — ABNORMAL HIGH (ref 0.3–1.2)
Total Protein: 8 g/dL (ref 6.5–8.1)

## 2020-01-24 LAB — DIFFERENTIAL
Abs Immature Granulocytes: 0.06 10*3/uL (ref 0.00–0.07)
Basophils Absolute: 0 10*3/uL (ref 0.0–0.1)
Basophils Relative: 0 %
Eosinophils Absolute: 0.1 10*3/uL (ref 0.0–0.5)
Eosinophils Relative: 1 %
Immature Granulocytes: 1 %
Lymphocytes Relative: 15 %
Lymphs Abs: 1.4 10*3/uL (ref 0.7–4.0)
Monocytes Absolute: 1.1 10*3/uL — ABNORMAL HIGH (ref 0.1–1.0)
Monocytes Relative: 12 %
Neutro Abs: 6.6 10*3/uL (ref 1.7–7.7)
Neutrophils Relative %: 71 %

## 2020-01-24 LAB — LIPASE, BLOOD: Lipase: 24 U/L (ref 11–51)

## 2020-01-24 MED ORDER — IOHEXOL 300 MG/ML  SOLN
100.0000 mL | Freq: Once | INTRAMUSCULAR | Status: AC | PRN
  Administered 2020-01-24: 100 mL via INTRAVENOUS

## 2020-01-24 MED ORDER — FENTANYL CITRATE (PF) 100 MCG/2ML IJ SOLN
50.0000 ug | Freq: Once | INTRAMUSCULAR | Status: AC
  Administered 2020-01-24: 50 ug via INTRAVENOUS
  Filled 2020-01-24: qty 2

## 2020-01-24 MED ORDER — OXYCODONE HCL 5 MG PO TABS: 5.0000 mg | ORAL_TABLET | Freq: Four times a day (QID) | ORAL | 0 refills | Status: DC | PRN

## 2020-01-24 MED ORDER — HEPARIN SOD (PORK) LOCK FLUSH 100 UNIT/ML IV SOLN
500.0000 [IU] | Freq: Once | INTRAVENOUS | Status: AC
  Administered 2020-01-24: 500 [IU]
  Filled 2020-01-24: qty 5

## 2020-01-24 MED ORDER — SODIUM CHLORIDE 0.9 % IV BOLUS
1000.0000 mL | Freq: Once | INTRAVENOUS | Status: AC
  Administered 2020-01-24: 1000 mL via INTRAVENOUS

## 2020-01-24 NOTE — ED Provider Notes (Signed)
[facility] EMERGENCY DEPARTMENT Provider Note  CSN: [id number] Arrival date & time: 01/24/20 [id number]    History Chief Complaint  Patient presents with  . Abdominal Pain    HPI  Cynthia Frye is a 59 y.o. female with history of pancreatic cancer reports increased cramping abdominal pain for the last 2 days, severe at times, not associated with fever or vomiting. She thinks she is constipated so she tried some suppositories. Had a small BM but no relief in pain. She has not had chemo in about 2 weeks, next treatment is scheduled for next week. She reports some occasional post-tussive emesis.    Past Medical History:  Diagnosis Date  . Allergies    peanuts, corn, beans, red grapefruit, naval oranges  . Asthma    allergy shots and medication  . [place]  [facility])    pancreatic  . Collagen vascular disease ([place])   . DDD (degenerative disc disease), lumbar   . Eustachian tube dysfunction 12/2012   rhinitis, vertigo- Dr. Wilburn Cornelia, ENT   . Fibroids   . Heart murmur    Echo 1/18: EF 55-60, no RWMA, normal diastolic function, trivial AI, PASP 32  . History of cardiac catheterization    LHC 3/18: normal coronary arteries.   . History of nuclear stress test    ETT-Myoview 2/18: EF 62, + ECG response; apical and distal septal ischemia; intermediate risk.  [place] Kitchen Hypertension   . Iron deficiency anemia   . Lupus [facility]) 2011   Dr. Trudie Reed  . Migraine headache    trial of generic maxalt 10 mg, January 2021  . Obesity   . Seasonal allergies   . Seizure in childhood [facility])    as a child no treatment none x 30 years    Past Surgical History:  Procedure Laterality Date  . ABDOMINAL HYSTERECTOMY    . BACK SURGERY  2009  . BIOPSY  12/10/2019   Procedure: BIOPSY;  Surgeon: Wilford Corner, MD;  Location: [facility] ENDOSCOPY;  Service: Endoscopy;;  . COLONOSCOPY WITH PROPOFOL N/A 04/11/2012   Procedure: COLONOSCOPY WITH PROPOFOL;  Surgeon: Garlan Fair, MD;  Location: [facility] ENDOSCOPY;  Service:  Endoscopy;  Laterality: N/A;  . ESOPHAGOGASTRODUODENOSCOPY (EGD) WITH PROPOFOL N/A 12/10/2019   Procedure: ESOPHAGOGASTRODUODENOSCOPY (EGD) WITH PROPOFOL;  Surgeon: Wilford Corner, MD;  Location: [facility] ENDOSCOPY;  Service: Endoscopy;  Laterality: N/A;  . HERNIA REPAIR     as child unbilical  . 0000000 partial discectomy and laminectomy     Dr. Christella Noa  . LEFT HEART CATH AND CORONARY ANGIOGRAPHY N/A 04/09/2016   Procedure: Left Heart Cath and Coronary Angiography;  Surgeon: Nelva Bush, MD;  Location: [place] CV LAB;  Service: Cardiovascular;  Laterality: N/A;  . MOUTH SURGERY    . PORTACATH PLACEMENT N/A 10/11/2019   Procedure: INSERTION PORT-A-CATH LEFT SUBCLAVIAN;  Surgeon: [name], MD;  Location: [place];  Service: General;  Laterality: N/A;    Family History  Problem Relation Age of Onset  . Asthma Mother   . Diabetes Mother   . Hypertension Mother   . Heart attack Father 10  . CAD Father   . Stroke Maternal Aunt   . Stroke Paternal Aunt   . Stroke Paternal Uncle   . Gout Other        uncles   . Cancer Niece        paternal half sister's daughter; unknown type; unknown age diagnosed  . Ovarian cancer Neg Hx   . Breast cancer Neg  Hx   . Colon cancer Neg Hx   . Migraines Neg Hx     Social History   Tobacco Use  . Smoking status: Never Smoker  . Smokeless tobacco: Never Used  Vaping Use  . Vaping Use: Never used  Substance Use Topics  . Alcohol use: No  . Drug use: No     Home Medications Prior to Admission medications   Medication Sig Start Date End Date Taking? Authorizing Provider  Cholecalciferol (VITAMIN D) 2000 units CAPS Take 2,000 Units by mouth daily.     [provider]  dexamethasone (DECADRON) 4 MG tablet Take 1 tablet (4 mg total) by mouth daily. Take 1 tablet once daily for 3 to 5 days after chemo 10/29/19   Alla Feeling, NP  diclofenac (VOLTAREN) 75 MG EC tablet Take 1 tablet (75 mg total) by mouth 2 (two) times  daily as needed. Patient taking differently: Take 75 mg by mouth 2 (two) times daily. 12/12/19   Shelly Coss, MD  Dietary Management Product (RHEUMATE) CAPS Take 1 capsule by mouth daily.     [provider]  diphenoxylate-atropine (LOMOTIL) 2.5-0.025 MG tablet Take 2 tablets by mouth 4 (four) times daily as needed for diarrhea or loose stools (use 2nd, if Imodium doesn't work. Max dose: 8 tablets/day.). Patient taking differently: Take 2 tablets by mouth 4 (four) times daily as needed for diarrhea or loose stools. 11/09/19   Orson Slick, MD  dronabinol (MARINOL) 2.5 MG capsule Take 1 capsule (2.5 mg total) by mouth 2 (two) times daily before a meal. Patient taking differently: Take 2.5 mg by mouth 2 (two) times daily as needed (nausea). 11/14/19   Truitt Merle, MD  enoxaparin (LOVENOX) 80 MG/0.8ML injection Inject 0.8 mLs (80 mg total) into the skin every 12 (twelve) hours. 01/09/20 04/08/20  Alla Feeling, NP  feeding supplement (ENSURE ENLIVE / ENSURE PLUS) LIQD Take 237 mLs by mouth 2 (two) times daily between meals. Patient not taking: Reported on 01/15/2020 11/09/19   Modena Jansky, MD  ferrous sulfate 325 (65 FE) MG tablet Take 325 mg by mouth daily with breakfast.    [provider]  fluticasone (FLONASE) 50 MCG/ACT nasal spray Place 2 sprays into both nostrils daily. Patient taking differently: Place 2 sprays into both nostrils daily as needed for allergies. 04/29/16   Harrison Mons, PA  hydroxychloroquine (PLAQUENIL) 200 MG tablet Take 400 mg by mouth daily.  06/17/14   [provider]  lipase/protease/amylase 24000-76000 units CPEP Take 1 capsule (24,000 Units total) by mouth 3 (three) times daily with meals. 12/12/19   Shelly Coss, MD  loperamide (IMODIUM) 2 MG capsule Take 2 capsules (4 mg total) by mouth 2 (two) times daily as needed for diarrhea or loose stools (Use 1st). 11/09/19   Hongalgi, Lenis Dickinson, MD  LORazepam (ATIVAN) 0.5 MG tablet Take 1  tablet (0.5 mg total) by mouth every 8 (eight) hours as needed for anxiety (and anticiaptory n/v. DO NOT TAKE WITH XANAX). 11/21/19   Alla Feeling, NP  losartan-hydrochlorothiazide (HYZAAR) 50-12.5 MG tablet Take 1 tablet by mouth daily. 01/21/20   [provider]  metoprolol succinate (TOPROL-XL) 50 MG 24 hr tablet Take 50 mg by mouth daily.     [provider]  mirtazapine (REMERON) 7.5 MG tablet Take 1 tablet (7.5 mg total) by mouth at bedtime. 01/08/20   Alla Feeling, NP  OLANZapine (ZYPREXA) 7.5 MG tablet Take 1 tablet (7.5 mg total)  by mouth at bedtime. 12/12/19   Shelly Coss, MD  ondansetron (ZOFRAN) 8 MG tablet Take 1 tablet (8 mg total) by mouth every 8 (eight) hours as needed for nausea or vomiting. 12/24/19   Truitt Merle, MD  oxyCODONE (OXY IR/ROXICODONE) 5 MG immediate release tablet Take 1 tablet (5 mg total) by mouth every 6 (six) hours as needed for severe pain. 01/24/20   Truddie Hidden, MD  senna-docusate (SENOKOT-S) 8.6-50 MG tablet Take 1 tablet by mouth 2 (two) times daily. Patient taking differently: Take 1 tablet by mouth 2 (two) times daily as needed for mild constipation. 12/12/19   Shelly Coss, MD  sucralfate (CARAFATE) 1 GM/10ML suspension Take 10 mLs (1 g total) by mouth 4 (four) times daily -  with meals and at bedtime. 12/12/19   Shelly Coss, MD  topiramate (TOPAMAX) 50 MG tablet Take 1 tablet (50 mg total) by mouth at bedtime. Patient taking differently: Take 50 mg by mouth daily as needed (migraines). 11/09/19   Hongalgi, Lenis Dickinson, MD  prochlorperazine (COMPAZINE) 10 MG tablet Take 1 tablet (10 mg total) by mouth every 6 (six) hours as needed (Nausea or vomiting). Patient taking differently: Take 10 mg by mouth every 6 (six) hours as needed for nausea or vomiting.  10/18/19 12/18/19  Truitt Merle, MD     Allergies    Cinnamon, Peanut-containing drug products, Prednisone, Corn oil, Corn-containing products, and Other   Review of  Systems   Review of Systems A comprehensive review of systems was completed and negative except as noted in HPI.    Physical Exam BP 139/82   Pulse 96   Temp 98.3 F (36.8 C) (Oral)   Resp (!) 22   Ht 5\' 7"  (1.702 m)   Wt 76.7 kg   SpO2 100%   BMI 26.47 kg/m   Physical Exam Vitals and nursing note reviewed.  Constitutional:      Appearance: Normal appearance.  HENT:     Head: Normocephalic and atraumatic.     Nose: Nose normal.     Mouth/Throat:     Mouth: Mucous membranes are moist.  Eyes:     Extraocular Movements: Extraocular movements intact.     Conjunctiva/sclera: Conjunctivae normal.  Cardiovascular:     Rate and Rhythm: Normal rate.  Pulmonary:     Effort: Pulmonary effort is normal.     Breath sounds: Normal breath sounds.  Abdominal:     General: Abdomen is flat.     Palpations: Abdomen is soft.     Tenderness: There is generalized abdominal tenderness. There is no guarding. Negative signs include Murphy's sign and McBurney's sign.  Musculoskeletal:        General: No swelling. Normal range of motion.     Cervical back: Neck supple.  Skin:    General: Skin is warm and dry.  Neurological:     General: No focal deficit present.     Mental Status: She is alert.  Psychiatric:        Mood and Affect: Mood normal.      ED Results / Procedures / Treatments   Labs (all labs ordered are listed, but only abnormal results are displayed) Labs Reviewed  COMPREHENSIVE METABOLIC PANEL - Abnormal; Notable for the following components:      Result Value   Sodium 134 (*)    Potassium 2.8 (*)    Chloride 97 (*)    Glucose, Bld 100 (*)    Calcium 8.8 (*)  Albumin 2.9 (*)    AST 162 (*)    ALT 121 (*)    Alkaline Phosphatase 510 (*)    Total Bilirubin 2.3 (*)    All other components within normal limits  CBC - Abnormal; Notable for the following components:   RBC 3.51 (*)    Hemoglobin 10.4 (*)    HCT 31.6 (*)    RDW 17.1 (*)    All other components  within normal limits  DIFFERENTIAL - Abnormal; Notable for the following components:   Monocytes Absolute 1.1 (*)    All other components within normal limits  LIPASE, BLOOD  URINALYSIS, ROUTINE W REFLEX MICROSCOPIC    EKG None  Radiology CT Abdomen Pelvis W Contrast  Result Date: 01/24/2020 CLINICAL DATA:  Abdominal pain for the past 2 days. Constipation. Ongoing chemotherapy for pancreatic cancer. EXAM: CT ABDOMEN AND PELVIS WITH CONTRAST TECHNIQUE: Multidetector CT imaging of the abdomen and pelvis was performed using the standard protocol following bolus administration of intravenous contrast. CONTRAST:  114mL OMNIPAQUE IOHEXOL 300 MG/ML  SOLN COMPARISON:  Abdomen MR dated 01/16/2020. Chest CTA dated 01/11/2020. Abdomen and pelvis CT dated 11/24/2019. FINDINGS: Lower chest: Mildly progressive linear and mass-like atelectasis at the left lung base with no significant change in linear atelectasis or scarring at the right lung base. Hepatobiliary: Large number of masses throughout the liver without significant change since 01/16/2020. Unremarkable gallbladder. Pancreas: No significant change in the mass in the pancreas at the level of the junction of the pancreatic head and body since 01/16/2020 with associated marked dilatation of the pancreatic duct. Spleen: Unremarkable. Adrenals/Urinary Tract: Adrenal glands are unremarkable. Kidneys are normal, without renal calculi, focal lesion, or hydronephrosis. Bladder is unremarkable. Stomach/Bowel: Minimal diffuse enlargement of the appendix with mild diffuse low density wall thickening without periappendiceal soft tissue stranding. The appendix measures 9 mm in maximum diameter, similar to 11/24/2019. Unremarkable stomach, small bowel and colon. No significant stool in the colon. Vascular/Lymphatic: No significant vascular findings are present. No enlarged abdominal or pelvic lymph nodes. Reproductive: Status post hysterectomy. No adnexal masses. Other:  No abdominal wall hernia or abnormality. No abdominopelvic ascites. Musculoskeletal: L5-S1 degenerative changes. IMPRESSION: 1. No acute abnormality. 2. No significant change in the known pancreatic malignancy with associated marked dilatation of the pancreatic duct. 3. Stable extensive liver metastatic disease. Electronically Signed   By: Claudie Revering M.D.   On: 01/24/2020 10:57    Procedures Procedures  Medications Ordered in the ED Medications  fentaNYL (SUBLIMAZE) injection 50 mcg (50 mcg Intravenous Given 01/24/20 0912)  sodium chloride 0.9 % bolus 1,000 mL (1,000 mLs Intravenous New Bag/Given 01/24/20 0912)  iohexol (OMNIPAQUE) 300 MG/ML solution 100 mL (100 mLs Intravenous Contrast Given 01/24/20 1018)     MDM Rules/Calculators/A&P MDM Patient with worsening abdominal pain/cramping with known pancreatitic cancer. Recently admitted for increased LFTs, MRCP showed dilated pancreatic duct but no other signs of proximal obstruction and no intervention needed. She was ultimately discharged home about a week ago. Will check labs, IVF, pain meds and send for CT to evaluate cause of pain.  ED Course  I have reviewed the triage vital signs and the nursing notes.  Pertinent labs & imaging results that were available during my care of the patient were reviewed by me and considered in my medical decision making (see chart for details).  Clinical Course as of 01/24/20 1150  Thu Jan 24, 2020  0939 CMP shows hypokalemia worsened from previous. LFT also slightly worse including elevated  bilirubin not previously present.  [CS]  0946 CBC unremarkable.  [CS]  0946 Lipase is normal.  [CS]  1146 CT images and results reviewed. Still showing pancreatic mass with pancreatic duct dilation. No signs of obstruction or constipation. Advised to continue to take her pain medications, drink plenty of fluids and follow up with Hem/Onc. Pain well controlled now.  [CS]    Clinical Course User Index [CS] Pollyann Savoy, MD    Final Clinical Impression(s) / ED Diagnoses Final diagnoses:  Generalized abdominal pain    Rx / DC Orders ED Discharge Orders         Ordered    oxyCODONE (OXY IR/ROXICODONE) 5 MG immediate release tablet  Every 6 hours PRN,   Status:  Discontinued        01/24/20 1149    oxyCODONE (OXY IR/ROXICODONE) 5 MG immediate release tablet  Every 6 hours PRN        01/24/20 1149           Pollyann Savoy, MD 01/24/20 1150

## 2020-01-24 NOTE — ED Triage Notes (Signed)
Pt presents with c/o stomach cramps for 2 days. Pt reports she took a laxative to help her have a bowel movement but only had a very small one this morning. Pt reports that she believes she is dehydrated. Pt is a cancer pt, last chemo tx approx 2 weeks ago.

## 2020-01-24 NOTE — ED Notes (Signed)
Pt discharged from this ED in stable condition at this time. All discharge instructions and follow up care reviewed with pt with no further questions at this time. Pt ambulatory with steady gait, clear speech.  

## 2020-01-25 DIAGNOSIS — R1310 Dysphagia, unspecified: Secondary | ICD-10-CM | POA: Diagnosis not present

## 2020-01-25 DIAGNOSIS — R112 Nausea with vomiting, unspecified: Secondary | ICD-10-CM | POA: Diagnosis not present

## 2020-01-25 DIAGNOSIS — K529 Noninfective gastroenteritis and colitis, unspecified: Secondary | ICD-10-CM | POA: Diagnosis not present

## 2020-01-25 DIAGNOSIS — E440 Moderate protein-calorie malnutrition: Secondary | ICD-10-CM | POA: Diagnosis not present

## 2020-01-26 DIAGNOSIS — R112 Nausea with vomiting, unspecified: Secondary | ICD-10-CM | POA: Diagnosis not present

## 2020-01-26 DIAGNOSIS — K529 Noninfective gastroenteritis and colitis, unspecified: Secondary | ICD-10-CM | POA: Diagnosis not present

## 2020-01-26 DIAGNOSIS — E440 Moderate protein-calorie malnutrition: Secondary | ICD-10-CM | POA: Diagnosis not present

## 2020-01-27 DIAGNOSIS — E440 Moderate protein-calorie malnutrition: Secondary | ICD-10-CM | POA: Diagnosis not present

## 2020-01-27 DIAGNOSIS — R112 Nausea with vomiting, unspecified: Secondary | ICD-10-CM | POA: Diagnosis not present

## 2020-01-27 DIAGNOSIS — K529 Noninfective gastroenteritis and colitis, unspecified: Secondary | ICD-10-CM | POA: Diagnosis not present

## 2020-01-28 NOTE — Progress Notes (Signed)
[place]   [phone] Fax:([phone]   Clinic Follow up Note   Patient Care Team: Josetta Huddle, MD as PCP - General (Internal Medicine) Jonnie Finner, RN as Oncology Nurse Navigator Truitt Merle, MD as Consulting Physician (Oncology) Arta Silence, MD as Consulting Physician (Gastroenterology) [name], MD as Consulting Physician (General Surgery)  Date of Service:  01/29/2020  CHIEF COMPLAINT: f/u of Pancreatic Cancer  SUMMARY OF ONCOLOGIC HISTORY: Oncology History Overview Note  Cancer Staging Pancreatic cancer [facility]) Staging form: Exocrine Pancreas, AJCC 8th Edition - Clinical stage from 10/18/2019: Stage IV (cT3, cN1, cM1) - Signed by Truitt Merle, MD on 10/18/2019    Pancreatic cancer ([place])  09/19/2019 Imaging   CT AP w contrast 09/19/19  IMPRESSION: 1. Findings are highly concerning for probable primary pancreatic adenocarcinoma in the anterior aspect of the pancreatic head. Several prominent borderline enlarged lymph nodes are noted in the hepatoduodenal nodal station, and there are multiple indeterminate liver lesions which are highly concerning for probable hepatic metastases. Further evaluation with nonemergent abdominal MRI with and without IV gadolinium with MRCP is recommended in the near future to better evaluate these findings.   09/25/2019 Procedure   Upper EUS by Dr Paulita Fujita  IMPRESSION -There was no evidence of significant pathology in the left lobe of the liver.  -A few lymph nodes were visualized and measures in the peripancreatic region and porta hepata region.  -Hyperchoic material consistent with sludge was visualized endosonographically in the gallbladder.  -There was no sign of significant pathology in the common bile duct.  -A mass was identified in the pancreatic head. If biopsy results show adenocarcinoma, it would be staged T3N1Mx by endosonographic criteria. Fine needle aspiration performed.    09/25/2019  Initial Biopsy   FINAL MICROSCOPIC DIAGNOSIS: Fine needle aspirate, Pancreas;  MALIGNANT CELLS PRESENT CONSISTENT WITH ADENOCARCINOMA.    10/02/2019 Initial Diagnosis   Pancreatic cancer ([place])   10/11/2019 Procedure   PAC placement y Dr Barry Dienes    10/15/2019 Imaging   CT Chest  IMPRESSION: 1. Small anterior left pneumothorax with dependent atelectasis in the left lower lobe. 2. Increased number of bilateral axillary and subpectoral lymph nodes with mild lymphadenopathy in the left axilla. While this would be an atypical presentation for metastatic pancreatic cancer, this possibility is not excluded 3. Main duct dilatation in the pancreas, better assessed on abdomen CT 09/19/2019.   10/16/2019 Imaging   MRI Abdomen  IMPRESSION: 1. Substantially motion degraded scan. 2. Probable persistent small anterior left lung base pneumothorax, better seen on chest CT from 1 day prior. 3. Poorly marginated hypoenhancing 3.7 x 2.9 cm pancreatic head mass, which appears to invade the anterior peripancreatic fat, compatible with known pancreatic adenocarcinoma. Diffuse irregular dilatation of the main pancreatic duct in the pancreatic body and tail. Mild narrowing of the main portal vein by the mass. Abdominal vasculature remains patent and otherwise uninvolved. 4. Numerous (greater than 10) small liver masses scattered throughout the liver, largest 1.0 cm, which appear to demonstrate targetoid enhancement on the limited motion degraded postcontrast sequences, compatible with liver metastases. 5. Mild porta hepatis adenopathy, suspicious for metastatic disease.   10/18/2019 Cancer Staging   Staging form: Exocrine Pancreas, AJCC 8th Edition - Clinical stage from 10/18/2019: Stage IV (cT3, cN1, cM1) - Signed by Truitt Merle, MD on 10/18/2019   10/24/2019 - 11/21/2019 Chemotherapy   First-line FOLFIRINOX q2weeks starting 10/24/19. C2 postponed due to N/V/D and dose reduced 20-30%. Given poor tolerance,  stopped after  2 cycles.    10/30/2019 Pathology Results   FINAL MICROSCOPIC DIAGNOSIS:   A. LIVER, LESION, BIOPSY:  -  Metastatic carcinoma  -  See comment   COMMENT:   By immunohistochemistry, the neoplastic cells are positive for  cytokeratin 7 and GATA3 with patchy nonspecific staining for PAX 8 but  negative for TTF-1, CDX2 and cytokeratin 20.  Overall, the findings are  consistent with metastasis of the patient's known breast carcinoma.  Prognostic panel (ER, PR, Her-2) is pending and will be reported in an  addendum.    ADDENDUM:   Dr. Laurence Ferrari notified us (November 01, 2019) that the patient was also  being worked up for a pancreatic mass.  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  In addition, ER and PR  are negative.  Gata-3 can be expressed in the pancreatic  adenocarcinomas; therefore, pancreatobiliary primary remains in the  differential.    11/02/2019 Genetic Testing   Negative genetic testing: no pathogenic variants detected in [employer] Common Hereditary Cancers Panel.  The report date is November 02, 2019.   The Common Hereditary Cancers Panel offered by [employer] includes sequencing and/or deletion duplication testing of the following 48 genes: APC, ATM, AXIN2, BARD1, BMPR1A, BRCA1, BRCA2, BRIP1, CDH1, CDK4, CDKN2A (p14ARF), CDKN2A (p16INK4a), CHEK2, CTNNA1, DICER1, EPCAM (Deletion/duplication testing only), GREM1 (promoter region deletion/duplication testing only), KIT, MEN1, MLH1, MSH2, MSH3, MSH6, MUTYH, NBN, NF1, NHTL1, PALB2, PDGFRA, PMS2, POLD1, POLE, PTEN, RAD50, RAD51C, RAD51D, RNF43, SDHB, SDHC, SDHD, SMAD4, SMARCA4. STK11, TP53, TSC1, TSC2, and VHL.  The following genes were evaluated for sequence changes only: SDHA and HOXB13 c.251G>A variant only.   11/04/2019 Imaging   CT AP  IMPRESSION: 1. Circumferential bowel wall thickening with adjacent fat stranding throughout the colon, most predominant in the transverse colon.  This is consistent with colitis, which may be infectious or inflammatory in etiology. 2. Ill-defined pancreatic head mass consistent with known malignancy, similar to mildly increased in comparison to prior CT. There are innumerable hypodense masses throughout the liver, increased in conspicuity in comparison to prior CT. Findings are worrisome for worsening metastatic disease. 3. Filling defect in the LEFT internal iliac vein, likely a thrombus with differential considerations including mixing artifact.   11/24/2019 Imaging   CT AP  IMPRESSION: Pancreatic head mass again noted compatible with patient's given history of pancreatic cancer.   Numerous metastases throughout the liver, enlarging since prior study.     12/10/2019 Procedure   Upper endoscopy by Dr Michail Sermon  IMPRESSION - Z-line regular, 42 cm from the incisors. - Esophageal ulcer with stigmata of recent bleeding. - Gastritis. Biopsied. - Mucosal changes in the duodenum. Biopsied. - Normal second portion of the duodenum. - Gastric and duodenal thickening concerning for inflammation from mets or due to mets from known pancreatic cancer.   FINAL MICROSCOPIC DIAGNOSIS:   A. DUODENUM, BIOPSY:  - Benign duodenal mucosa.  - No dysplasia or malignancy.   B. STOMACH, BIOPSY:  - Antral mucosa with mild chronic active inflammation.  - No intestinal metaplasia, dysplasia or carcinoma.   12/28/2019 -  Chemotherapy   Change her to second-line Gemcitabine 2 weeks on/1 week off starting 12/28/19.    01/16/2020 Imaging   MRI abdomen  IMPRESSION: 1. Prominently dilated dorsal pancreatic duct extending to the level of the mass in the junction of the pancreatic head and body. The mass abuts the gastric wall, portal vein, and common bile duct, leading to mild narrowing of the  common bile duct down to about 0.3 cm, with the common hepatic duct mildly prominent 0.9 cm. The mass has mildly enlarged compared to the CT examination  of 11/24/2019. 2. Extensive metastatic burden throughout all lobes of the liver, substantially increased from 11/24/2019 and fairly similar to 01/11/2020. 3. Trace left pleural effusion with adjacent atelectasis in the left lower lobe. Borderline cardiomegaly. 4. Sludge in the gallbladder with gallbladder wall thickening.   01/24/2020 Imaging   CT AP  IMPRESSION: 1. No acute abnormality. 2. No significant change in the known pancreatic malignancy with associated marked dilatation of the pancreatic duct. 3. Stable extensive liver metastatic disease.      CURRENT THERAPY:  Change her to second-line Gemcitabine 2 weeks on/1 week off starting 12/28/19.   INTERVAL HISTORY:  DEBARA KAMPHUIS is here for a follow up. She presents to the clinic alone. She was evaluated in ED on 01/24/2020 for abdominal pain, nausea and dehydration, was discharged home. She was also hospitalized on 12/21. Chemo has been held since 12/13.  Restarted TPN lately, tolerating well  She has low appetite, not eating much, feels bloated, especially on TPN. Fatigued, able to do all ADLs but not much else.    All other systems were reviewed with the patient and are negative.  MEDICAL HISTORY:  Past Medical History:  Diagnosis Date  . Allergies    peanuts, corn, beans, red grapefruit, naval oranges  . Asthma    allergy shots and medication  . Cancer [facility])    pancreatic  . Collagen vascular disease ([place])   . DDD (degenerative disc disease), lumbar   . Eustachian tube dysfunction 12/2012   rhinitis, vertigo- Dr. Wilburn Cornelia, ENT   . Fibroids   . Heart murmur    Echo 1/18: EF 55-60, no RWMA, normal diastolic function, trivial AI, PASP 32  . History of cardiac catheterization    LHC 3/18: normal coronary arteries.   . History of nuclear stress test    ETT-Myoview 2/18: EF 62, + ECG response; apical and distal septal ischemia; intermediate risk.  Marland Kitchen Hypertension   . Iron deficiency anemia   . Lupus [facility]) 2011    Dr. Trudie Reed  . Migraine headache    trial of generic maxalt 10 mg, January 2021  . Obesity   . Seasonal allergies   . Seizure in childhood [facility])    as a child no treatment none x 30 years    SURGICAL HISTORY: Past Surgical History:  Procedure Laterality Date  . ABDOMINAL HYSTERECTOMY    . BACK SURGERY  2009  . BIOPSY  12/10/2019   Procedure: BIOPSY;  Surgeon: Wilford Corner, MD;  Location: [facility] ENDOSCOPY;  Service: Endoscopy;;  . COLONOSCOPY WITH PROPOFOL N/A 04/11/2012   Procedure: COLONOSCOPY WITH PROPOFOL;  Surgeon: Garlan Fair, MD;  Location: [facility] ENDOSCOPY;  Service: Endoscopy;  Laterality: N/A;  . ESOPHAGOGASTRODUODENOSCOPY (EGD) WITH PROPOFOL N/A 12/10/2019   Procedure: ESOPHAGOGASTRODUODENOSCOPY (EGD) WITH PROPOFOL;  Surgeon: Wilford Corner, MD;  Location: [facility] ENDOSCOPY;  Service: Endoscopy;  Laterality: N/A;  . HERNIA REPAIR     as child unbilical  . T9-Q partial discectomy and laminectomy     Dr. Christella Noa  . LEFT HEART CATH AND CORONARY ANGIOGRAPHY N/A 04/09/2016   Procedure: Left Heart Cath and Coronary Angiography;  Surgeon: Nelva Bush, MD;  Location: [facility] CV [facility];  Service: Cardiovascular;  Laterality: N/A;  . MOUTH SURGERY    . PORTACATH PLACEMENT N/A 10/11/2019   Procedure: INSERTION PORT-A-CATH LEFT SUBCLAVIAN;  Surgeon: [name], MD;  Location: [facility];  Service: General;  Laterality: N/A;    I have reviewed the social history and family history with the patient and they are unchanged from previous note.  ALLERGIES:  is allergic to cinnamon, peanut-containing drug products, prednisone, corn oil, corn-containing products, and other.  MEDICATIONS:  Current Outpatient Medications  Medication Sig Dispense Refill  . Cholecalciferol (VITAMIN D) 2000 units CAPS Take 2,000 Units by mouth daily.     Marland Kitchen dexamethasone (DECADRON) 4 MG tablet Take 1 tablet (4 mg total) by mouth daily. Take 1 tablet once daily for 3 to 5 days after chemo  20 tablet 0  . diclofenac (VOLTAREN) 75 MG EC tablet Take 1 tablet (75 mg total) by mouth 2 (two) times daily as needed. (Patient taking differently: Take 75 mg by mouth 2 (two) times daily.)    . Dietary Management Product (RHEUMATE) CAPS Take 1 capsule by mouth daily.     . diphenoxylate-atropine (LOMOTIL) 2.5-0.025 MG tablet Take 2 tablets by mouth 4 (four) times daily as needed for diarrhea or loose stools (use 2nd, if Imodium doesn't work. Max dose: 8 tablets/day.). (Patient taking differently: Take 2 tablets by mouth 4 (four) times daily as needed for diarrhea or loose stools.) 20 tablet 0  . dronabinol (MARINOL) 2.5 MG capsule Take 1 capsule (2.5 mg total) by mouth 2 (two) times daily before a meal. (Patient taking differently: Take 2.5 mg by mouth 2 (two) times daily as needed (nausea).) 30 capsule 0  . enoxaparin (LOVENOX) 80 MG/0.8ML injection Inject 0.8 mLs (80 mg total) into the skin every 12 (twelve) hours. 48 mL 2  . feeding supplement (ENSURE ENLIVE / ENSURE PLUS) LIQD Take 237 mLs by mouth 2 (two) times daily between meals. (Patient not taking: Reported on 01/15/2020) 237 mL 12  . ferrous sulfate 325 (65 FE) MG tablet Take 325 mg by mouth daily with breakfast.    . fluticasone (FLONASE) 50 MCG/ACT nasal spray Place 2 sprays into both nostrils daily. (Patient taking differently: Place 2 sprays into both nostrils daily as needed for allergies.) 16 g 12  . hydroxychloroquine (PLAQUENIL) 200 MG tablet Take 400 mg by mouth daily.   5  . lipase/protease/amylase 24000-76000 units CPEP Take 1 capsule (24,000 Units total) by mouth 3 (three) times daily with meals. 270 capsule 0  . loperamide (IMODIUM) 2 MG capsule Take 2 capsules (4 mg total) by mouth 2 (two) times daily as needed for diarrhea or loose stools (Use 1st). 30 capsule 0  . LORazepam (ATIVAN) 0.5 MG tablet Take 1 tablet (0.5 mg total) by mouth every 8 (eight) hours as needed for anxiety (and anticiaptory n/v. DO NOT TAKE WITH XANAX).  30 tablet 0  . losartan-hydrochlorothiazide (HYZAAR) 50-12.5 MG tablet Take 1 tablet by mouth daily.    . metoprolol succinate (TOPROL-XL) 50 MG 24 hr tablet Take 50 mg by mouth daily.     . mirtazapine (REMERON) 7.5 MG tablet Take 1 tablet (7.5 mg total) by mouth at bedtime. 30 tablet 0  . OLANZapine (ZYPREXA) 7.5 MG tablet Take 1 tablet (7.5 mg total) by mouth at bedtime. 30 tablet 0  . ondansetron (ZOFRAN) 8 MG tablet Take 1 tablet (8 mg total) by mouth every 8 (eight) hours as needed for nausea or vomiting. 30 tablet 3  . oxyCODONE (OXY IR/ROXICODONE) 5 MG immediate release tablet Take 1 tablet (5 mg total) by mouth every 6 (six) hours as needed for severe pain.  60 tablet 0  . senna-docusate (SENOKOT-S) 8.6-50 MG tablet Take 1 tablet by mouth 2 (two) times daily. (Patient taking differently: Take 1 tablet by mouth 2 (two) times daily as needed for mild constipation.) 60 tablet 1  . sucralfate (CARAFATE) 1 GM/10ML suspension Take 10 mLs (1 g total) by mouth 4 (four) times daily -  with meals and at bedtime. 420 mL 3  . topiramate (TOPAMAX) 50 MG tablet Take 1 tablet (50 mg total) by mouth at bedtime. (Patient taking differently: Take 50 mg by mouth daily as needed (migraines).)     No current facility-administered medications for this visit.    PHYSICAL EXAMINATION: ECOG PERFORMANCE STATUS: 2 - Symptomatic, <50% confined to bed  Vitals:   01/29/20 1110  BP: 113/89  Pulse: (!) 141  Resp: 17  Temp: 98.1 F (36.7 C)  SpO2: 99%   Filed Weights   01/29/20 1110  Weight: 169 lb 3.2 oz (76.7 kg)    GENERAL:alert, no distress and comfortable SKIN: skin color, texture, turgor are normal, no rashes or significant lesions EYES: normal, Conjunctiva are pink and non-injected, sclera clear NECK: supple, thyroid normal size, non-tender, without nodularity LYMPH:  no palpable lymphadenopathy in the cervical, axillary  LUNGS: clear to auscultation and percussion with normal breathing  effort HEART: regular rate & rhythm and no murmurs and no lower extremity edema ABDOMEN:abdomen soft, non-tender and normal bowel sounds Musculoskeletal:no cyanosis of digits and no clubbing  NEURO: alert & oriented x 3 with fluent speech, no focal motor/sensory deficits  LABORATORY DATA:  I have reviewed the data as listed CBC Latest Ref Rng & Units 01/24/2020 01/18/2020 01/17/2020  WBC 4.0 - 10.5 K/uL 9.8 5.3 7.0  Hemoglobin 12.0 - 15.0 g/dL 10.4(L) 8.9(L) 7.2(L)  Hematocrit 36.0 - 46.0 % 31.6(L) 28.0(L) 23.3(L)  Platelets 150 - 400 K/uL 385 230 193     CMP Latest Ref Rng & Units 01/24/2020 01/18/2020 01/17/2020  Glucose 70 - 99 mg/dL 100(H) 75 103(H)  BUN 6 - 20 mg/dL '15 15 17  ' Creatinine 0.44 - 1.00 mg/dL 0.91 0.74 0.84  Sodium 135 - 145 mmol/L 134(L) 141 139  Potassium 3.5 - 5.1 mmol/L 2.8(L) 3.2(L) 3.8  Chloride 98 - 111 mmol/L 97(L) 106 105  CO2 22 - 32 mmol/L '24 25 25  ' Calcium 8.9 - 10.3 mg/dL 8.8(L) 8.6(L) 8.8(L)  Total Protein 6.5 - 8.1 g/dL 8.0 6.6 6.7  Total Bilirubin 0.3 - 1.2 mg/dL 2.3(H) 1.2 0.9  Alkaline Phos 38 - 126 U/L 510(H) 352(H) 298(H)  AST 15 - 41 U/L 162(H) 107(H) 126(H)  ALT 0 - 44 U/L 121(H) 131(H) 159(H)      RADIOGRAPHIC STUDIES: I have personally reviewed the radiological images as listed and agreed with the findings in the report. No results found.   ASSESSMENT & PLAN:  Cynthia Frye is a 60 y.o. female with    1. 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 Her EUS biopsy with Dr Paulita Fujita on 09/25/19 shows pancreatic adenocarcinoma.  -Her 10/16/19 abdominal MRI showed multiple (>10) small liver lesions, most are consistent with metastatic lesions. Liver biopsy from 10/5/21confirmed metastatic pancreatic cancer. -I started her on first-line or neoadjuvant chemo with FOLFIRINOX q2weeks on 10/24/19. Goal of therapy is to control  or shrink disease. Due to poor tolerance treatment stopped after C2 11/21/19.I switched her to second-line Gemcitabine 2 weeks on/1 week off starting  12/28/19 -she is overall not doing well, with multiple symptoms and poor performance status -I reviewed her MRI from December 20, 20 21 and a CT from January 24, 2020, both showed significant disease progression compared to the scan from October 2021 -She so far has had a very limited response to chemotherapy, we discussed the option of adding low-dose Abraxane to gemcitabine, versus palliative care and hospice.  Patient was quite overwhelmed, we called her husband and discussed with him on the phone also. -Plan to meet patient and her husband in person again next week to make the final decision -Patient does expressed her wishes to continue chemo treatment for disease control, she understands her cancer is not curable and her prognosis is guarded -We will proceed single agent gemcitabine today, with dose reduction to 600 mg/m, and add 1 hour IV fluids -I will request FO on her previous liver biopsy sample    2.Nausea/vomiting,abdominal cramps -secondary to underlying malignancy and chemotherapy -worse after C1 FOLFIRINOX, required ED visit for IV fluids, potassium, antiemetics and treated for UTI -Admitted10/10-10/10frN/V/D, diagnosed with colitis and found to have left iliac vein DVT, discharged on 10/15 -persistent n/v, improved after switching to single agent gemcitabine -Continue supportive care and symptom management  3.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 Comorbidities: Lupus, HTN, Migraines  -Symptomatic in her joints, Dx many years ago  -She is being followed by Rheumatologist Dr HWyline Copasand being treated with weekly Methotrexate injections, Plaquenil andDiclofenac. -For Migraines is well controlled on Topomax. She has been seen by Dr AJaynee Eagles   5. Anxiety,insomnia,Social Support  -She has been very anxioussincediagnosis. -[place] She does not have children. She does have a brother who lives close by but no other near relatives. -She plans to apply for disability -She has been referred to social work -She tried mirtazapine for appetite, thought it made her jittery but she is unsure. She is willing to retry to see if this helps her insomnia  6. Genetics  -negative   PLAN: -recent scan reviewed, she has cancer progression  -will proceed with single agent gemcitabine today, with dose reduction to 600 mg/m, and a 1 hour IV fluids -Will come back with her husband in next week to decide next step of adding low dose abraxane vs stopping chemo  -send FO on her previous liver biopsy    No problem-specific Assessment & Plan notes found for this encounter.   No orders of the defined types were placed in this encounter.  All questions were answered. The patient knows to call the clinic with any problems, questions or concerns. No barriers to learning was detected. The total time spent in the appointment was 40 minutes.     YTruitt Merle MD 01/29/2020   I, AJoslyn Devon am acting as scribe for YTruitt Merle MD.   I have reviewed the above documentation for accuracy and completeness, and I agree with the above.

## 2020-01-29 ENCOUNTER — Inpatient Hospital Stay (HOSPITAL_BASED_OUTPATIENT_CLINIC_OR_DEPARTMENT_OTHER): Payer: BC Managed Care – PPO | Admitting: Hematology

## 2020-01-29 ENCOUNTER — Inpatient Hospital Stay: Payer: BC Managed Care – PPO | Attending: Hematology

## 2020-01-29 ENCOUNTER — Inpatient Hospital Stay: Payer: BC Managed Care – PPO

## 2020-01-29 ENCOUNTER — Other Ambulatory Visit: Payer: Self-pay

## 2020-01-29 ENCOUNTER — Encounter: Payer: Self-pay | Admitting: Hematology

## 2020-01-29 VITALS — BP 113/89 | HR 141 | Temp 98.1°F | Resp 17 | Ht 67.0 in | Wt 169.2 lb

## 2020-01-29 VITALS — HR 130

## 2020-01-29 DIAGNOSIS — C259 Malignant neoplasm of pancreas, unspecified: Secondary | ICD-10-CM | POA: Diagnosis present

## 2020-01-29 DIAGNOSIS — J45909 Unspecified asthma, uncomplicated: Secondary | ICD-10-CM | POA: Diagnosis present

## 2020-01-29 DIAGNOSIS — C250 Malignant neoplasm of head of pancreas: Secondary | ICD-10-CM

## 2020-01-29 DIAGNOSIS — E876 Hypokalemia: Secondary | ICD-10-CM | POA: Diagnosis not present

## 2020-01-29 DIAGNOSIS — D630 Anemia in neoplastic disease: Secondary | ICD-10-CM | POA: Diagnosis present

## 2020-01-29 DIAGNOSIS — Z6826 Body mass index (BMI) 26.0-26.9, adult: Secondary | ICD-10-CM

## 2020-01-29 DIAGNOSIS — N12 Tubulo-interstitial nephritis, not specified as acute or chronic: Secondary | ICD-10-CM | POA: Diagnosis present

## 2020-01-29 DIAGNOSIS — E43 Unspecified severe protein-calorie malnutrition: Secondary | ICD-10-CM | POA: Diagnosis present

## 2020-01-29 DIAGNOSIS — R188 Other ascites: Secondary | ICD-10-CM | POA: Diagnosis not present

## 2020-01-29 DIAGNOSIS — R079 Chest pain, unspecified: Secondary | ICD-10-CM | POA: Diagnosis not present

## 2020-01-29 DIAGNOSIS — R7401 Elevation of levels of liver transaminase levels: Secondary | ICD-10-CM | POA: Diagnosis present

## 2020-01-29 DIAGNOSIS — C787 Secondary malignant neoplasm of liver and intrahepatic bile duct: Secondary | ICD-10-CM | POA: Diagnosis not present

## 2020-01-29 DIAGNOSIS — W19XXXA Unspecified fall, initial encounter: Secondary | ICD-10-CM | POA: Diagnosis present

## 2020-01-29 DIAGNOSIS — F419 Anxiety disorder, unspecified: Secondary | ICD-10-CM | POA: Diagnosis present

## 2020-01-29 DIAGNOSIS — Z91018 Allergy to other foods: Secondary | ICD-10-CM

## 2020-01-29 DIAGNOSIS — I2699 Other pulmonary embolism without acute cor pulmonale: Secondary | ICD-10-CM | POA: Diagnosis not present

## 2020-01-29 DIAGNOSIS — E440 Moderate protein-calorie malnutrition: Secondary | ICD-10-CM | POA: Diagnosis not present

## 2020-01-29 DIAGNOSIS — Z825 Family history of asthma and other chronic lower respiratory diseases: Secondary | ICD-10-CM

## 2020-01-29 DIAGNOSIS — A419 Sepsis, unspecified organism: Secondary | ICD-10-CM | POA: Diagnosis not present

## 2020-01-29 DIAGNOSIS — K219 Gastro-esophageal reflux disease without esophagitis: Secondary | ICD-10-CM | POA: Diagnosis present

## 2020-01-29 DIAGNOSIS — M329 Systemic lupus erythematosus, unspecified: Secondary | ICD-10-CM | POA: Diagnosis present

## 2020-01-29 DIAGNOSIS — T451X5A Adverse effect of antineoplastic and immunosuppressive drugs, initial encounter: Secondary | ICD-10-CM | POA: Diagnosis present

## 2020-01-29 DIAGNOSIS — D849 Immunodeficiency, unspecified: Secondary | ICD-10-CM | POA: Diagnosis not present

## 2020-01-29 DIAGNOSIS — I2782 Chronic pulmonary embolism: Secondary | ICD-10-CM | POA: Diagnosis present

## 2020-01-29 DIAGNOSIS — K529 Noninfective gastroenteritis and colitis, unspecified: Secondary | ICD-10-CM | POA: Diagnosis not present

## 2020-01-29 DIAGNOSIS — Z8249 Family history of ischemic heart disease and other diseases of the circulatory system: Secondary | ICD-10-CM

## 2020-01-29 DIAGNOSIS — I10 Essential (primary) hypertension: Secondary | ICD-10-CM | POA: Diagnosis present

## 2020-01-29 DIAGNOSIS — D6959 Other secondary thrombocytopenia: Secondary | ICD-10-CM | POA: Diagnosis present

## 2020-01-29 DIAGNOSIS — R109 Unspecified abdominal pain: Secondary | ICD-10-CM | POA: Diagnosis not present

## 2020-01-29 DIAGNOSIS — R112 Nausea with vomiting, unspecified: Secondary | ICD-10-CM | POA: Diagnosis not present

## 2020-01-29 DIAGNOSIS — R000 Tachycardia, unspecified: Secondary | ICD-10-CM | POA: Diagnosis not present

## 2020-01-29 DIAGNOSIS — N10 Acute tubulo-interstitial nephritis: Secondary | ICD-10-CM | POA: Diagnosis not present

## 2020-01-29 DIAGNOSIS — I82412 Acute embolism and thrombosis of left femoral vein: Secondary | ICD-10-CM | POA: Diagnosis not present

## 2020-01-29 DIAGNOSIS — I471 Supraventricular tachycardia: Secondary | ICD-10-CM | POA: Diagnosis present

## 2020-01-29 DIAGNOSIS — U071 COVID-19: Secondary | ICD-10-CM | POA: Diagnosis not present

## 2020-01-29 DIAGNOSIS — Z86718 Personal history of other venous thrombosis and embolism: Secondary | ICD-10-CM

## 2020-01-29 DIAGNOSIS — D61818 Other pancytopenia: Secondary | ICD-10-CM | POA: Diagnosis present

## 2020-01-29 DIAGNOSIS — Z7189 Other specified counseling: Secondary | ICD-10-CM

## 2020-01-29 DIAGNOSIS — Z79899 Other long term (current) drug therapy: Secondary | ICD-10-CM

## 2020-01-29 DIAGNOSIS — Z91010 Allergy to peanuts: Secondary | ICD-10-CM

## 2020-01-29 DIAGNOSIS — Z95828 Presence of other vascular implants and grafts: Secondary | ICD-10-CM

## 2020-01-29 LAB — CBC WITH DIFFERENTIAL (CANCER CENTER ONLY)
Abs Immature Granulocytes: 0.09 10*3/uL — ABNORMAL HIGH (ref 0.00–0.07)
Basophils Absolute: 0.1 10*3/uL (ref 0.0–0.1)
Basophils Relative: 0 %
Eosinophils Absolute: 0 10*3/uL (ref 0.0–0.5)
Eosinophils Relative: 0 %
HCT: 33.7 % — ABNORMAL LOW (ref 36.0–46.0)
Hemoglobin: 10.8 g/dL — ABNORMAL LOW (ref 12.0–15.0)
Immature Granulocytes: 1 %
Lymphocytes Relative: 11 %
Lymphs Abs: 1.4 10*3/uL (ref 0.7–4.0)
MCH: 29.2 pg (ref 26.0–34.0)
MCHC: 32 g/dL (ref 30.0–36.0)
MCV: 91.1 fL (ref 80.0–100.0)
Monocytes Absolute: 1.2 10*3/uL — ABNORMAL HIGH (ref 0.1–1.0)
Monocytes Relative: 9 %
Neutro Abs: 10.7 10*3/uL — ABNORMAL HIGH (ref 1.7–7.7)
Neutrophils Relative %: 79 %
Platelet Count: 261 10*3/uL (ref 150–400)
RBC: 3.7 MIL/uL — ABNORMAL LOW (ref 3.87–5.11)
RDW: 17.2 % — ABNORMAL HIGH (ref 11.5–15.5)
WBC Count: 13.5 10*3/uL — ABNORMAL HIGH (ref 4.0–10.5)
nRBC: 0.3 % — ABNORMAL HIGH (ref 0.0–0.2)

## 2020-01-29 LAB — CMP (CANCER CENTER ONLY)
ALT: 69 U/L — ABNORMAL HIGH (ref 0–44)
AST: 96 U/L — ABNORMAL HIGH (ref 15–41)
Albumin: 2.5 g/dL — ABNORMAL LOW (ref 3.5–5.0)
Alkaline Phosphatase: 378 U/L — ABNORMAL HIGH (ref 38–126)
Anion gap: 8 (ref 5–15)
BUN: 21 mg/dL — ABNORMAL HIGH (ref 6–20)
CO2: 34 mmol/L — ABNORMAL HIGH (ref 22–32)
Calcium: 9 mg/dL (ref 8.9–10.3)
Chloride: 99 mmol/L (ref 98–111)
Creatinine: 0.71 mg/dL (ref 0.44–1.00)
GFR, Estimated: >60 mL/min (ref >60)
Glucose, Bld: 119 mg/dL — ABNORMAL HIGH (ref 70–99)
Potassium: 3.6 mmol/L (ref 3.5–5.1)
Sodium: 141 mmol/L (ref 135–145)
Total Bilirubin: 1.7 mg/dL — ABNORMAL HIGH (ref 0.3–1.2)
Total Protein: 8.6 g/dL — ABNORMAL HIGH (ref 6.5–8.1)

## 2020-01-29 MED ORDER — OXYCODONE HCL 5 MG PO TABS
5.0000 mg | ORAL_TABLET | Freq: Once | ORAL | Status: AC
  Administered 2020-01-29: 5 mg via ORAL

## 2020-01-29 MED ORDER — PROCHLORPERAZINE MALEATE 10 MG PO TABS
10.0000 mg | ORAL_TABLET | Freq: Once | ORAL | Status: AC
  Administered 2020-01-29: 10 mg via ORAL

## 2020-01-29 MED ORDER — SODIUM CHLORIDE 0.9% FLUSH
10.0000 mL | INTRAVENOUS | Status: DC | PRN
  Administered 2020-01-29: 10 mL
  Filled 2020-01-29: qty 10

## 2020-01-29 MED ORDER — SODIUM CHLORIDE 0.9 % IV SOLN
Freq: Once | INTRAVENOUS | Status: AC
  Filled 2020-01-29: qty 250

## 2020-01-29 MED ORDER — SODIUM CHLORIDE 0.9% FLUSH
10.0000 mL | Freq: Once | INTRAVENOUS | Status: AC
  Administered 2020-01-29: 10 mL
  Filled 2020-01-29: qty 10

## 2020-01-29 MED ORDER — OXYCODONE HCL 5 MG PO TABS
ORAL_TABLET | ORAL | Status: AC
  Filled 2020-01-29: qty 1

## 2020-01-29 MED ORDER — HEPARIN SOD (PORK) LOCK FLUSH 100 UNIT/ML IV SOLN
500.0000 [IU] | Freq: Once | INTRAVENOUS | Status: AC | PRN
  Administered 2020-01-29: 500 [IU]
  Filled 2020-01-29: qty 5

## 2020-01-29 MED ORDER — SODIUM CHLORIDE 0.9 % IV SOLN
600.0000 mg/m2 | Freq: Once | INTRAVENOUS | Status: AC
  Administered 2020-01-29: 1178 mg via INTRAVENOUS
  Filled 2020-01-29: qty 30.98

## 2020-01-29 MED ORDER — PROCHLORPERAZINE MALEATE 10 MG PO TABS
ORAL_TABLET | ORAL | Status: AC
  Filled 2020-01-29: qty 1

## 2020-01-29 MED ORDER — SODIUM CHLORIDE 0.9 % IV SOLN
INTRAVENOUS | Status: AC
  Filled 2020-01-29 (×2): qty 250

## 2020-01-29 NOTE — Progress Notes (Signed)
Per Dr. Mosetta Putt, ok for treatment today with heart rate 130, Total bilirubin 1.7, and AST 96.

## 2020-01-29 NOTE — Patient Instructions (Signed)
[facility] Discharge Instructions for Patients Receiving Chemotherapy  Today you received the following chemotherapy agent: Gemcitabine (Gemzar)  To help prevent nausea and vomiting after your treatment, we encourage you to take your nausea medication as directed by your MD.   If you develop nausea and vomiting that is not controlled by your nausea medication, call the clinic.   BELOW ARE SYMPTOMS THAT SHOULD BE REPORTED IMMEDIATELY:  *FEVER GREATER THAN 100.5 F  *CHILLS WITH OR WITHOUT FEVER  NAUSEA AND VOMITING THAT IS NOT CONTROLLED WITH YOUR NAUSEA MEDICATION  *UNUSUAL SHORTNESS OF BREATH  *UNUSUAL BRUISING OR BLEEDING  TENDERNESS IN MOUTH AND THROAT WITH OR WITHOUT PRESENCE OF ULCERS  *URINARY PROBLEMS  *BOWEL PROBLEMS  UNUSUAL RASH Items with * indicate a potential emergency and should be followed up as soon as possible.  Feel free to call the clinic should you have any questions or concerns. The clinic phone number is [phone].  Please show the [facility] at check-in to the Emergency Department and triage nurse.   Dehydration, Adult Dehydration is condition in which there is not enough water or other fluids in the body. This happens when a person loses more fluids than he or she takes in. Important body parts cannot work right without the right amount of fluids. Any loss of fluids from the body can cause dehydration. Dehydration can be mild, worse, or very bad. It should be treated right away to keep it from getting very bad. What are the causes? This condition may be caused by:  Conditions that cause loss of water or other fluids, such as: ? Watery poop (diarrhea). ? Vomiting. ? Sweating a lot. ? Peeing (urinating) a lot.  Not drinking enough fluids, especially when you: ? Are ill. ? Are doing things that take a lot of energy to do.  Other illnesses and conditions, such as fever or infection.  Certain medicines, such as medicines  that take extra fluid out of the body (diuretics).  Lack of safe drinking water.  Not being able to get enough water and food. What increases the risk? The following factors may make you more likely to develop this condition:  Having a long-term (chronic) illness that has not been treated the right way, such as: ? Diabetes. ? Heart disease. ? Kidney disease.  Being 50 years of age or older.  Having a disability.  Living in a place that is high above the ground or sea (high in altitude). The thinner, dried air causes more fluid loss.  Doing exercises that put stress on your body for a long time. What are the signs or symptoms? Symptoms of dehydration depend on how bad it is. Mild or worse dehydration  Thirst.  Dry lips or dry mouth.  Feeling dizzy or light-headed, especially when you stand up from sitting.  Muscle cramps.  Your body making: ? Dark pee (urine). Pee may be the color of tea. ? Less pee than normal. ? Less tears than normal.  Headache. Very bad dehydration  Changes in skin. Skin may: ? Be cold to the touch (clammy). ? Be blotchy or pale. ? Not go back to normal right after you lightly pinch it and let it go.  Little or no tears, pee, or sweat.  Changes in vital signs, such as: ? Fast breathing. ? Low blood pressure. ? Weak pulse. ? Pulse that is more than 100 beats a minute when you are sitting still.  Other changes, such as: ?  Feeling very thirsty. ? Eyes that look hollow (sunken). ? Cold hands and feet. ? Being mixed up (confused). ? Being very tired (lethargic) or having trouble waking from sleep. ? Short-term weight loss. ? Loss of consciousness. How is this treated? Treatment for this condition depends on how bad it is. Treatment should start right away. Do not wait until your condition gets very bad. Very bad dehydration is an emergency. You will need to go to a hospital.  Mild or worse dehydration can be treated at home. You may be  asked to: ? Drink more fluids. ? Drink an oral rehydration solution (ORS). This drink helps get the right amounts of fluids and salts and minerals in the blood (electrolytes).  Very bad dehydration can be treated: ? With fluids through an IV tube. ? By getting normal levels of salts and minerals in your blood. This is often done by giving salts and minerals through a tube. The tube is passed through your nose and into your stomach. ? By treating the root cause. Follow these instructions at home: Oral rehydration solution If told by your doctor, drink an ORS:  Make an ORS. Use instructions on the package.  Start by drinking small amounts, about  cup (120 mL) every 5-10 minutes.  Slowly drink more until you have had the amount that your doctor said to have. Eating and drinking         Drink enough clear fluid to keep your pee pale yellow. If you were told to drink an ORS, finish the ORS first. Then, start slowly drinking other clear fluids. Drink fluids such as: ? Water. Do not drink only water. Doing that can make the salt (sodium) level in your body get too low. ? Water from ice chips you suck on. ? Fruit juice that you have added water to (diluted). ? Low-calorie sports drinks.  Eat foods that have the right amounts of salts and minerals, such as: ? Bananas. ? Oranges. ? Potatoes. ? Tomatoes. ? Spinach.  Do not drink alcohol.  Avoid: ? Drinks that have a lot of sugar. These include:  High-calorie sports drinks.  Fruit juice that you did not add water to.  Soda.  Caffeine. ? Foods that are greasy or have a lot of fat or sugar. General instructions  Take over-the-counter and prescription medicines only as told by your doctor.  Do not take salt tablets. Doing that can make the salt level in your body get too high.  Return to your normal activities as told by your doctor. Ask your doctor what activities are safe for you.  Keep all follow-up visits as told by  your doctor. This is important. Contact a doctor if:  You have pain in your belly (abdomen) and the pain: ? Gets worse. ? Stays in one place.  You have a rash.  You have a stiff neck.  You get angry or annoyed (irritable) more easily than normal.  You are more tired or have a harder time waking than normal.  You feel: ? Weak or dizzy. ? Very thirsty. Get help right away if you have:  Any symptoms of very bad dehydration.  Symptoms of vomiting, such as: ? You cannot eat or drink without vomiting. ? Your vomiting gets worse or does not go away. ? Your vomit has blood or green stuff in it.  Symptoms that get worse with treatment.  A fever.  A very bad headache.  Problems with peeing or pooping (having a bowel  movement), such as: ? Watery poop that gets worse or does not go away. ? Blood in your poop (stool). This may cause poop to look black and tarry. ? Not peeing in 6-8 hours. ? Peeing only a small amount of very dark pee in 6-8 hours.  Trouble breathing. These symptoms may be an emergency. Do not wait to see if the symptoms will go away. Get medical help right away. Call your local emergency services (911 in the [place] Do not drive yourself to the hospital. Summary  Dehydration is a condition in which there is not enough water or other fluids in the body. This happens when a person loses more fluids than he or she takes in.  Treatment for this condition depends on how bad it is. Treatment should be started right away. Do not wait until your condition gets very bad.  Drink enough clear fluid to keep your pee pale yellow. If you were told to drink an oral rehydration solution (ORS), finish the ORS first. Then, start slowly drinking other clear fluids.  Take over-the-counter and prescription medicines only as told by your doctor.  Get help right away if you have any symptoms of very bad dehydration. This information is not intended to replace advice given to you by  your health care provider. Make sure you discuss any questions you have with your health care provider. Document Revised: 08/24/2018 Document Reviewed: 08/24/2018 Elsevier Patient Education  2020 [employer].

## 2020-01-30 ENCOUNTER — Telehealth: Payer: Self-pay

## 2020-01-30 ENCOUNTER — Telehealth: Payer: Self-pay | Admitting: Hematology

## 2020-01-30 DIAGNOSIS — K529 Noninfective gastroenteritis and colitis, unspecified: Secondary | ICD-10-CM | POA: Diagnosis not present

## 2020-01-30 DIAGNOSIS — E440 Moderate protein-calorie malnutrition: Secondary | ICD-10-CM | POA: Diagnosis not present

## 2020-01-30 DIAGNOSIS — R112 Nausea with vomiting, unspecified: Secondary | ICD-10-CM | POA: Diagnosis not present

## 2020-01-30 NOTE — Telephone Encounter (Signed)
Marcus called asking if she can take 2 oxycodone instead of 1.  She is having increased abdominal pain.  Per Dr Mosetta Putt ok for her to take 2 tabs every 6 hours as needed for pain.  I cautioned that she could get constipated from increased dose.  I recommended she take senokot-s 2 tablets 1-2 times per day.  She verbalized understanding.

## 2020-01-30 NOTE — Telephone Encounter (Signed)
Scheduled appts per 1/4 los. Pt confirmed appt date/time.

## 2020-01-31 ENCOUNTER — Other Ambulatory Visit: Payer: Self-pay | Admitting: Nurse Practitioner

## 2020-01-31 DIAGNOSIS — J45909 Unspecified asthma, uncomplicated: Secondary | ICD-10-CM | POA: Diagnosis present

## 2020-01-31 DIAGNOSIS — Z825 Family history of asthma and other chronic lower respiratory diseases: Secondary | ICD-10-CM

## 2020-01-31 DIAGNOSIS — Z79899 Other long term (current) drug therapy: Secondary | ICD-10-CM

## 2020-01-31 DIAGNOSIS — Z8249 Family history of ischemic heart disease and other diseases of the circulatory system: Secondary | ICD-10-CM

## 2020-01-31 DIAGNOSIS — R188 Other ascites: Secondary | ICD-10-CM | POA: Diagnosis present

## 2020-01-31 DIAGNOSIS — N12 Tubulo-interstitial nephritis, not specified as acute or chronic: Secondary | ICD-10-CM | POA: Diagnosis present

## 2020-01-31 DIAGNOSIS — T451X5A Adverse effect of antineoplastic and immunosuppressive drugs, initial encounter: Secondary | ICD-10-CM | POA: Diagnosis present

## 2020-01-31 DIAGNOSIS — Z86718 Personal history of other venous thrombosis and embolism: Secondary | ICD-10-CM

## 2020-01-31 DIAGNOSIS — I2782 Chronic pulmonary embolism: Secondary | ICD-10-CM | POA: Diagnosis present

## 2020-01-31 DIAGNOSIS — F419 Anxiety disorder, unspecified: Secondary | ICD-10-CM | POA: Diagnosis present

## 2020-01-31 DIAGNOSIS — M329 Systemic lupus erythematosus, unspecified: Secondary | ICD-10-CM | POA: Diagnosis present

## 2020-01-31 DIAGNOSIS — E43 Unspecified severe protein-calorie malnutrition: Secondary | ICD-10-CM | POA: Diagnosis present

## 2020-01-31 DIAGNOSIS — I10 Essential (primary) hypertension: Secondary | ICD-10-CM | POA: Diagnosis present

## 2020-01-31 DIAGNOSIS — Z91010 Allergy to peanuts: Secondary | ICD-10-CM

## 2020-01-31 DIAGNOSIS — R7401 Elevation of levels of liver transaminase levels: Secondary | ICD-10-CM | POA: Diagnosis present

## 2020-01-31 DIAGNOSIS — W19XXXA Unspecified fall, initial encounter: Secondary | ICD-10-CM | POA: Diagnosis present

## 2020-01-31 DIAGNOSIS — C259 Malignant neoplasm of pancreas, unspecified: Secondary | ICD-10-CM | POA: Diagnosis present

## 2020-01-31 DIAGNOSIS — E876 Hypokalemia: Secondary | ICD-10-CM | POA: Diagnosis present

## 2020-01-31 DIAGNOSIS — C787 Secondary malignant neoplasm of liver and intrahepatic bile duct: Secondary | ICD-10-CM | POA: Diagnosis present

## 2020-01-31 DIAGNOSIS — Z91018 Allergy to other foods: Secondary | ICD-10-CM

## 2020-01-31 DIAGNOSIS — K219 Gastro-esophageal reflux disease without esophagitis: Secondary | ICD-10-CM | POA: Diagnosis present

## 2020-01-31 DIAGNOSIS — I471 Supraventricular tachycardia: Secondary | ICD-10-CM | POA: Diagnosis present

## 2020-01-31 DIAGNOSIS — I82412 Acute embolism and thrombosis of left femoral vein: Secondary | ICD-10-CM | POA: Diagnosis present

## 2020-01-31 DIAGNOSIS — A419 Sepsis, unspecified organism: Principal | ICD-10-CM | POA: Diagnosis present

## 2020-01-31 DIAGNOSIS — D630 Anemia in neoplastic disease: Secondary | ICD-10-CM | POA: Diagnosis present

## 2020-01-31 DIAGNOSIS — Z6826 Body mass index (BMI) 26.0-26.9, adult: Secondary | ICD-10-CM

## 2020-01-31 DIAGNOSIS — D6959 Other secondary thrombocytopenia: Secondary | ICD-10-CM | POA: Diagnosis present

## 2020-01-31 DIAGNOSIS — U071 COVID-19: Secondary | ICD-10-CM | POA: Diagnosis present

## 2020-01-31 DIAGNOSIS — D61818 Other pancytopenia: Secondary | ICD-10-CM | POA: Diagnosis present

## 2020-01-31 DIAGNOSIS — D849 Immunodeficiency, unspecified: Secondary | ICD-10-CM | POA: Diagnosis present

## 2020-02-01 ENCOUNTER — Emergency Department (HOSPITAL_COMMUNITY): Payer: BC Managed Care – PPO

## 2020-02-01 ENCOUNTER — Other Ambulatory Visit: Payer: Self-pay

## 2020-02-01 ENCOUNTER — Inpatient Hospital Stay (HOSPITAL_COMMUNITY)
Admission: EM | Admit: 2020-02-01 | Discharge: 2020-02-07 | DRG: 871 | Disposition: A | Discharge status: Home / Self Care | Payer: BC Managed Care – PPO | Attending: Family Medicine | Admitting: Family Medicine

## 2020-02-01 ENCOUNTER — Encounter (HOSPITAL_COMMUNITY): Payer: Self-pay

## 2020-02-01 DIAGNOSIS — T451X5A Adverse effect of antineoplastic and immunosuppressive drugs, initial encounter: Secondary | ICD-10-CM | POA: Diagnosis present

## 2020-02-01 DIAGNOSIS — U071 COVID-19: Secondary | ICD-10-CM | POA: Diagnosis present

## 2020-02-01 DIAGNOSIS — I2782 Chronic pulmonary embolism: Secondary | ICD-10-CM | POA: Diagnosis present

## 2020-02-01 DIAGNOSIS — R7401 Elevation of levels of liver transaminase levels: Secondary | ICD-10-CM | POA: Diagnosis present

## 2020-02-01 DIAGNOSIS — N12 Tubulo-interstitial nephritis, not specified as acute or chronic: Secondary | ICD-10-CM | POA: Diagnosis present

## 2020-02-01 DIAGNOSIS — E876 Hypokalemia: Secondary | ICD-10-CM | POA: Diagnosis present

## 2020-02-01 DIAGNOSIS — W19XXXA Unspecified fall, initial encounter: Secondary | ICD-10-CM | POA: Diagnosis present

## 2020-02-01 DIAGNOSIS — Z91010 Allergy to peanuts: Secondary | ICD-10-CM

## 2020-02-01 DIAGNOSIS — F419 Anxiety disorder, unspecified: Secondary | ICD-10-CM | POA: Diagnosis present

## 2020-02-01 DIAGNOSIS — C787 Secondary malignant neoplasm of liver and intrahepatic bile duct: Secondary | ICD-10-CM | POA: Diagnosis present

## 2020-02-01 DIAGNOSIS — I471 Supraventricular tachycardia: Secondary | ICD-10-CM | POA: Diagnosis present

## 2020-02-01 DIAGNOSIS — Z825 Family history of asthma and other chronic lower respiratory diseases: Secondary | ICD-10-CM

## 2020-02-01 DIAGNOSIS — D630 Anemia in neoplastic disease: Secondary | ICD-10-CM | POA: Diagnosis present

## 2020-02-01 DIAGNOSIS — K219 Gastro-esophageal reflux disease without esophagitis: Secondary | ICD-10-CM | POA: Diagnosis present

## 2020-02-01 DIAGNOSIS — Z6826 Body mass index (BMI) 26.0-26.9, adult: Secondary | ICD-10-CM | POA: Diagnosis not present

## 2020-02-01 DIAGNOSIS — Z86718 Personal history of other venous thrombosis and embolism: Secondary | ICD-10-CM

## 2020-02-01 DIAGNOSIS — R188 Other ascites: Secondary | ICD-10-CM | POA: Diagnosis present

## 2020-02-01 DIAGNOSIS — D61818 Other pancytopenia: Secondary | ICD-10-CM | POA: Diagnosis present

## 2020-02-01 DIAGNOSIS — E43 Unspecified severe protein-calorie malnutrition: Secondary | ICD-10-CM | POA: Diagnosis present

## 2020-02-01 DIAGNOSIS — Z8249 Family history of ischemic heart disease and other diseases of the circulatory system: Secondary | ICD-10-CM

## 2020-02-01 DIAGNOSIS — D6959 Other secondary thrombocytopenia: Secondary | ICD-10-CM | POA: Diagnosis present

## 2020-02-01 DIAGNOSIS — D849 Immunodeficiency, unspecified: Secondary | ICD-10-CM | POA: Diagnosis present

## 2020-02-01 DIAGNOSIS — I82412 Acute embolism and thrombosis of left femoral vein: Secondary | ICD-10-CM | POA: Diagnosis present

## 2020-02-01 DIAGNOSIS — C259 Malignant neoplasm of pancreas, unspecified: Secondary | ICD-10-CM | POA: Diagnosis present

## 2020-02-01 DIAGNOSIS — R509 Fever, unspecified: Secondary | ICD-10-CM

## 2020-02-01 DIAGNOSIS — I10 Essential (primary) hypertension: Secondary | ICD-10-CM | POA: Diagnosis present

## 2020-02-01 DIAGNOSIS — J45909 Unspecified asthma, uncomplicated: Secondary | ICD-10-CM | POA: Diagnosis present

## 2020-02-01 DIAGNOSIS — Z91018 Allergy to other foods: Secondary | ICD-10-CM

## 2020-02-01 DIAGNOSIS — A419 Sepsis, unspecified organism: Principal | ICD-10-CM | POA: Diagnosis present

## 2020-02-01 DIAGNOSIS — M329 Systemic lupus erythematosus, unspecified: Secondary | ICD-10-CM | POA: Diagnosis present

## 2020-02-01 DIAGNOSIS — Z79899 Other long term (current) drug therapy: Secondary | ICD-10-CM

## 2020-02-01 LAB — COMPREHENSIVE METABOLIC PANEL
ALT: 63 U/L — ABNORMAL HIGH (ref 0–44)
AST: 119 U/L — ABNORMAL HIGH (ref 15–41)
Albumin: 2.4 g/dL — ABNORMAL LOW (ref 3.5–5.0)
Alkaline Phosphatase: 226 U/L — ABNORMAL HIGH (ref 38–126)
Anion gap: 9 (ref 5–15)
BUN: 19 mg/dL (ref 6–20)
CO2: 25 mmol/L (ref 22–32)
Calcium: 8.6 mg/dL — ABNORMAL LOW (ref 8.9–10.3)
Chloride: 103 mmol/L (ref 98–111)
Creatinine, Ser: 0.64 mg/dL (ref 0.44–1.00)
GFR, Estimated: >60 mL/min (ref >60)
Glucose, Bld: 165 mg/dL — ABNORMAL HIGH (ref 70–99)
Potassium: 4 mmol/L (ref 3.5–5.1)
Sodium: 137 mmol/L (ref 135–145)
Total Bilirubin: 2.5 mg/dL — ABNORMAL HIGH (ref 0.3–1.2)
Total Protein: 7.5 g/dL (ref 6.5–8.1)

## 2020-02-01 LAB — URINALYSIS, ROUTINE W REFLEX MICROSCOPIC
Bilirubin Urine: NEGATIVE
Glucose, UA: NEGATIVE mg/dL
Hgb urine dipstick: NEGATIVE
Ketones, ur: NEGATIVE mg/dL
Leukocytes,Ua: NEGATIVE
Nitrite: NEGATIVE
Protein, ur: NEGATIVE mg/dL
Specific Gravity, Urine: 1.016 (ref 1.005–1.030)
pH: 9 — ABNORMAL HIGH (ref 5.0–8.0)

## 2020-02-01 LAB — CBC WITH DIFFERENTIAL/PLATELET
Abs Immature Granulocytes: 0.05 10*3/uL (ref 0.00–0.07)
Basophils Absolute: 0.1 10*3/uL (ref 0.0–0.1)
Basophils Relative: 1 %
Eosinophils Absolute: 0.1 10*3/uL (ref 0.0–0.5)
Eosinophils Relative: 1 %
HCT: 30.4 % — ABNORMAL LOW (ref 36.0–46.0)
Hemoglobin: 9.4 g/dL — ABNORMAL LOW (ref 12.0–15.0)
Immature Granulocytes: 0 %
Lymphocytes Relative: 9 %
Lymphs Abs: 1 10*3/uL (ref 0.7–4.0)
MCH: 29.7 pg (ref 26.0–34.0)
MCHC: 30.9 g/dL (ref 30.0–36.0)
MCV: 95.9 fL (ref 80.0–100.0)
Monocytes Absolute: 0.2 10*3/uL (ref 0.1–1.0)
Monocytes Relative: 2 %
Neutro Abs: 9.9 10*3/uL — ABNORMAL HIGH (ref 1.7–7.7)
Neutrophils Relative %: 87 %
Platelets: 139 10*3/uL — ABNORMAL LOW (ref 150–400)
RBC: 3.17 MIL/uL — ABNORMAL LOW (ref 3.87–5.11)
RDW: 17.2 % — ABNORMAL HIGH (ref 11.5–15.5)
WBC: 11.3 10*3/uL — ABNORMAL HIGH (ref 4.0–10.5)
nRBC: 0 % (ref 0.0–0.2)

## 2020-02-01 LAB — MRSA PCR SCREENING: MRSA by PCR: NEGATIVE

## 2020-02-01 LAB — RESP PANEL BY RT-PCR (FLU A&B, COVID) ARPGX2
Influenza A by PCR: NEGATIVE
Influenza B by PCR: NEGATIVE
SARS Coronavirus 2 by RT PCR: POSITIVE — AB

## 2020-02-01 LAB — LACTIC ACID, PLASMA
Lactic Acid, Venous: 2.5 mmol/L (ref 0.5–1.9)
Lactic Acid, Venous: 2 mmol/L (ref 0.5–1.9)

## 2020-02-01 LAB — TROPONIN I (HIGH SENSITIVITY)
Troponin I (High Sensitivity): 9 ng/L (ref <18)
Troponin I (High Sensitivity): 10 ng/L (ref <18)

## 2020-02-01 LAB — PREGNANCY, URINE: Preg Test, Ur: NEGATIVE

## 2020-02-01 LAB — PROTIME-INR
INR: 1.4 — ABNORMAL HIGH (ref 0.8–1.2)
Prothrombin Time: 16.3 seconds — ABNORMAL HIGH (ref 11.4–15.2)

## 2020-02-01 LAB — APTT: aPTT: 49 seconds — ABNORMAL HIGH (ref 24–36)

## 2020-02-01 LAB — LIPASE, BLOOD: Lipase: 20 U/L (ref 11–51)

## 2020-02-01 MED ORDER — TOPIRAMATE 25 MG PO TABS: 50.0000 mg | ORAL_TABLET | Freq: Every day | ORAL | Status: DC | PRN

## 2020-02-01 MED ORDER — LACTATED RINGERS IV BOLUS
500.0000 mL | Freq: Once | INTRAVENOUS | Status: AC
  Administered 2020-02-01: 500 mL via INTRAVENOUS

## 2020-02-01 MED ORDER — PANCRELIPASE (LIP-PROT-AMYL) 12000-38000 UNITS PO CPEP
24000.0000 [IU] | ORAL_CAPSULE | Freq: Three times a day (TID) | ORAL | Status: DC
  Administered 2020-02-01 – 2020-02-07 (×17): 24000 [IU] via ORAL
  Filled 2020-02-01 (×19): qty 2

## 2020-02-01 MED ORDER — ZINC SULFATE 220 (50 ZN) MG PO CAPS
220.0000 mg | ORAL_CAPSULE | Freq: Every day | ORAL | Status: DC
  Administered 2020-02-01 – 2020-02-07 (×7): 220 mg via ORAL
  Filled 2020-02-01 (×7): qty 1

## 2020-02-01 MED ORDER — HYDROMORPHONE HCL 1 MG/ML IJ SOLN
0.5000 mg | INTRAMUSCULAR | Status: DC | PRN
  Administered 2020-02-01 – 2020-02-07 (×15): 0.5 mg via INTRAVENOUS
  Filled 2020-02-01: qty 0.5
  Filled 2020-02-01: qty 1
  Filled 2020-02-01 (×2): qty 0.5
  Filled 2020-02-01: qty 1
  Filled 2020-02-01 (×4): qty 0.5
  Filled 2020-02-01 (×3): qty 1
  Filled 2020-02-01: qty 0.5
  Filled 2020-02-01: qty 1
  Filled 2020-02-01: qty 0.5

## 2020-02-01 MED ORDER — DRONABINOL 2.5 MG PO CAPS
2.5000 mg | ORAL_CAPSULE | Freq: Two times a day (BID) | ORAL | Status: DC | PRN
Start: 2020-02-01 — End: 2020-02-07
  Administered 2020-02-03 – 2020-02-05 (×2): 2.5 mg via ORAL
  Filled 2020-02-01 (×2): qty 1

## 2020-02-01 MED ORDER — METRONIDAZOLE IN NACL 5-0.79 MG/ML-% IV SOLN: 500.0000 mg | Freq: Three times a day (TID) | INTRAVENOUS | Status: DC

## 2020-02-01 MED ORDER — LORAZEPAM 0.5 MG PO TABS
0.5000 mg | ORAL_TABLET | Freq: Three times a day (TID) | ORAL | Status: DC | PRN
  Administered 2020-02-03 – 2020-02-06 (×3): 0.5 mg via ORAL
  Filled 2020-02-01 (×3): qty 1

## 2020-02-01 MED ORDER — ACETAMINOPHEN 650 MG RE SUPP: 650.0000 mg | Freq: Four times a day (QID) | RECTAL | Status: DC | PRN

## 2020-02-01 MED ORDER — OXYCODONE HCL 5 MG PO TABS
5.0000 mg | ORAL_TABLET | Freq: Four times a day (QID) | ORAL | Status: DC | PRN
  Administered 2020-02-02 – 2020-02-05 (×7): 5 mg via ORAL
  Filled 2020-02-01 (×7): qty 1

## 2020-02-01 MED ORDER — HYDROMORPHONE HCL 1 MG/ML IJ SOLN
0.5000 mg | Freq: Once | INTRAMUSCULAR | Status: AC
  Administered 2020-02-01: 0.5 mg via INTRAVENOUS
  Filled 2020-02-01: qty 1

## 2020-02-01 MED ORDER — HYDROCOD POLST-CPM POLST ER 10-8 MG/5ML PO SUER: 5.0000 mL | Freq: Two times a day (BID) | ORAL | Status: DC | PRN

## 2020-02-01 MED ORDER — VANCOMYCIN HCL IN DEXTROSE 1-5 GM/200ML-% IV SOLN: 1000.0000 mg | Freq: Once | INTRAVENOUS | Status: DC

## 2020-02-01 MED ORDER — LACTATED RINGERS IV BOLUS
1000.0000 mL | Freq: Once | INTRAVENOUS | Status: AC
  Administered 2020-02-01: 1000 mL via INTRAVENOUS

## 2020-02-01 MED ORDER — ACETAMINOPHEN 325 MG PO TABS
650.0000 mg | ORAL_TABLET | Freq: Four times a day (QID) | ORAL | Status: DC | PRN
  Administered 2020-02-04 – 2020-02-07 (×2): 650 mg via ORAL
  Filled 2020-02-01 (×4): qty 2

## 2020-02-01 MED ORDER — ENOXAPARIN SODIUM 80 MG/0.8ML ~~LOC~~ SOLN
80.0000 mg | Freq: Two times a day (BID) | SUBCUTANEOUS | Status: DC
  Administered 2020-02-01 – 2020-02-07 (×13): 80 mg via SUBCUTANEOUS
  Filled 2020-02-01 (×13): qty 0.8

## 2020-02-01 MED ORDER — IOHEXOL 350 MG/ML SOLN
100.0000 mL | Freq: Once | INTRAVENOUS | Status: AC | PRN
  Administered 2020-02-01: 100 mL via INTRAVENOUS

## 2020-02-01 MED ORDER — ASCORBIC ACID 500 MG PO TABS
500.0000 mg | ORAL_TABLET | Freq: Every day | ORAL | Status: DC
  Administered 2020-02-01 – 2020-02-07 (×7): 500 mg via ORAL
  Filled 2020-02-01 (×7): qty 1

## 2020-02-01 MED ORDER — ONDANSETRON HCL 4 MG PO TABS
4.0000 mg | ORAL_TABLET | Freq: Four times a day (QID) | ORAL | Status: DC | PRN
  Filled 2020-02-01: qty 1

## 2020-02-01 MED ORDER — SODIUM CHLORIDE 0.9 % IV SOLN: INTRAVENOUS | Status: AC

## 2020-02-01 MED ORDER — LACTATED RINGERS IV BOLUS
1500.0000 mL | Freq: Once | INTRAVENOUS | Status: AC
  Administered 2020-02-01: 1500 mL via INTRAVENOUS

## 2020-02-01 MED ORDER — ONDANSETRON HCL 4 MG/2ML IJ SOLN: 4.0000 mg | Freq: Once | INTRAMUSCULAR | Status: DC

## 2020-02-01 MED ORDER — VANCOMYCIN HCL IN DEXTROSE 1-5 GM/200ML-% IV SOLN
1000.0000 mg | Freq: Two times a day (BID) | INTRAVENOUS | Status: DC
  Administered 2020-02-01 – 2020-02-02 (×3): 1000 mg via INTRAVENOUS
  Filled 2020-02-01 (×3): qty 200

## 2020-02-01 MED ORDER — LACTATED RINGERS IV SOLN: INTRAVENOUS | Status: DC

## 2020-02-01 MED ORDER — ONDANSETRON HCL 4 MG/2ML IJ SOLN
4.0000 mg | Freq: Four times a day (QID) | INTRAMUSCULAR | Status: DC | PRN
  Administered 2020-02-02 – 2020-02-07 (×8): 4 mg via INTRAVENOUS
  Filled 2020-02-01 (×8): qty 2

## 2020-02-01 MED ORDER — SODIUM CHLORIDE 0.9 % IV SOLN
2.0000 g | Freq: Three times a day (TID) | INTRAVENOUS | Status: DC
  Administered 2020-02-01 – 2020-02-07 (×17): 2 g via INTRAVENOUS
  Filled 2020-02-01: qty 0.28
  Filled 2020-02-01 (×18): qty 2

## 2020-02-01 MED ORDER — LORAZEPAM 2 MG/ML IJ SOLN
1.0000 mg | Freq: Once | INTRAMUSCULAR | Status: AC
  Administered 2020-02-01: 1 mg via INTRAVENOUS
  Filled 2020-02-01: qty 1

## 2020-02-01 MED ORDER — METRONIDAZOLE 500 MG PO TABS
500.0000 mg | ORAL_TABLET | Freq: Three times a day (TID) | ORAL | Status: DC
  Administered 2020-02-01 – 2020-02-07 (×17): 500 mg via ORAL
  Filled 2020-02-01 (×21): qty 1

## 2020-02-01 MED ORDER — VANCOMYCIN HCL 1500 MG/300ML IV SOLN
1500.0000 mg | Freq: Once | INTRAVENOUS | Status: AC
  Administered 2020-02-01: 1500 mg via INTRAVENOUS
  Filled 2020-02-01: qty 300

## 2020-02-01 MED ORDER — DEXAMETHASONE SODIUM PHOSPHATE 4 MG/ML IJ SOLN
4.0000 mg | Freq: Once | INTRAMUSCULAR | Status: AC
  Administered 2020-02-01: 4 mg via INTRAVENOUS
  Filled 2020-02-01: qty 1

## 2020-02-01 MED ORDER — GUAIFENESIN-DM 100-10 MG/5ML PO SYRP
10.0000 mL | ORAL_SOLUTION | ORAL | Status: DC | PRN
  Administered 2020-02-03 – 2020-02-05 (×2): 10 mL via ORAL
  Filled 2020-02-01 (×2): qty 10

## 2020-02-01 MED ORDER — HYDROXYCHLOROQUINE SULFATE 200 MG PO TABS: 400.0000 mg | ORAL_TABLET | Freq: Every day | ORAL | Status: DC

## 2020-02-01 MED ORDER — SODIUM CHLORIDE 0.9 % IV SOLN
2.0000 g | Freq: Once | INTRAVENOUS | Status: AC
  Administered 2020-02-01: 2 g via INTRAVENOUS
  Filled 2020-02-01: qty 20

## 2020-02-01 NOTE — ED Notes (Addendum)
MD notified of abnormal vitals, HR in 150s. He is assessing pt.

## 2020-02-01 NOTE — H&P (Signed)
History and Physical    Cynthia Frye OZD:[id number] DOB: [birth year] DOA: 02/01/2020  PCP: Josetta Huddle, MD  Patient coming from: Home  Chief Complaint: N/V/ab pain  HPI: Cynthia Frye is a 60 y.o. female with medical history significant of pancreatic CA, HTN, SLE. Presenting with N/V/ab pain. Reports that she had her chemo session 3 days ago. Later that night, her symptoms started. Her ab pain initially was in the RUQ but then moved to LUQ. It was episodic, sharp. Her vomiting was w/o hematemesis. Tried oxycodone for her symptoms. Helped a little initially but worsened later in the evening. She called her onco to see if she could double her dose, but the pain worsened to the point that she needed to come to the ED. She denies any other alleviating or aggravating factors. She denies any other treatment  ED Course: She was found to be septic. Source believed to be pyelonephritis base on imaging. Started on rocephin. Found to be COVID positive without any respiratory symptoms or lesions on imaging. [facility]H was called for admission.   Review of Systems:  Denies CP, palpitations, dyspnea, cough.  Review of systems is otherwise negative for all not mentioned in HPI.   PMHx Past Medical History:  Diagnosis Date  . Allergies    peanuts, corn, beans, red grapefruit, naval oranges  . Asthma    allergy shots and medication  . Cancer [facility])    pancreatic  . Collagen vascular disease ([place])   . DDD (degenerative disc disease), lumbar   . Eustachian tube dysfunction 12/2012   rhinitis, vertigo- Dr. Wilburn Cornelia, ENT   . Fibroids   . Heart murmur    Echo 1/18: EF 55-60, no RWMA, normal diastolic function, trivial AI, PASP 32  . History of cardiac catheterization    LHC 3/18: normal coronary arteries.   . History of nuclear stress test    ETT-Myoview 2/18: EF 62, + ECG response; apical and distal septal ischemia; intermediate risk.  Marland Kitchen Hypertension   . Iron deficiency anemia   . Lupus [facility]) 2011    Dr. Trudie Reed  . Migraine headache    trial of generic maxalt 10 mg, January 2021  . Obesity   . Seasonal allergies   . Seizure in childhood [facility])    as a child no treatment none x 30 years    PSHx Past Surgical History:  Procedure Laterality Date  . ABDOMINAL HYSTERECTOMY    . BACK SURGERY  2009  . BIOPSY  12/10/2019   Procedure: BIOPSY;  Surgeon: Wilford Corner, MD;  Location: [facility];  Service: Endoscopy;;  . COLONOSCOPY WITH PROPOFOL N/A 04/11/2012   Procedure: COLONOSCOPY WITH PROPOFOL;  Surgeon: Garlan Fair, MD;  Location: [facility] ENDOSCOPY;  Service: Endoscopy;  Laterality: N/A;  . ESOPHAGOGASTRODUODENOSCOPY (EGD) WITH PROPOFOL N/A 12/10/2019   Procedure: ESOPHAGOGASTRODUODENOSCOPY (EGD) WITH PROPOFOL;  Surgeon: Wilford Corner, MD;  Location: [facility];  Service: Endoscopy;  Laterality: N/A;  . HERNIA REPAIR     as child unbilical  . Q5-Z partial discectomy and laminectomy     Dr. Christella Noa  . LEFT HEART CATH AND CORONARY ANGIOGRAPHY N/A 04/09/2016   Procedure: Left Heart Cath and Coronary Angiography;  Surgeon: Nelva Bush, MD;  Location: [facility];  Service: Cardiovascular;  Laterality: N/A;  . MOUTH SURGERY    . PORTACATH PLACEMENT N/A 10/11/2019   Procedure: INSERTION PORT-A-CATH LEFT SUBCLAVIAN;  Surgeon: [name], MD;  Location: [place];  Service: General;  Laterality: N/A;  SocHx  reports that she has never smoked. She has never used smokeless tobacco. She reports that she does not drink alcohol and does not use drugs.  Allergies  Allergen Reactions  . Cinnamon Other (See Comments)    Other reaction(s): Other (See Comments) Unknown  On allergy test Unknown  On allergy test  . Peanut-Containing Drug Products Hives  . Prednisone Other (See Comments)    Interacts with another medicine she is taking  . Corn Oil Hives  . Corn-Containing Products Hives  . Other Rash    Red grapefruit and naval oranges- lips tingling  and facial rash Potatoes, tomatoes, garlic, oregano/basil caused headache Other reaction(s): migratory headache    FamHx Family History  Problem Relation Age of Onset  . Asthma Mother   . Diabetes Mother   . Hypertension Mother   . Heart attack Father 21  . CAD Father   . Stroke Maternal Aunt   . Stroke Paternal Aunt   . Stroke Paternal Uncle   . Gout Other        uncles   . Cancer Niece        paternal half sister's daughter; unknown type; unknown age diagnosed  . Ovarian cancer Neg Hx   . Breast cancer Neg Hx   . Colon cancer Neg Hx   . Migraines Neg Hx     Prior to Admission medications   Medication Sig Start Date End Date Taking? Authorizing Provider  Cholecalciferol (VITAMIN D) 2000 units CAPS Take 2,000 Units by mouth daily.    Yes [provider]  dexamethasone (DECADRON) 4 MG tablet Take 1 tablet (4 mg total) by mouth daily. Take 1 tablet once daily for 3 to 5 days after chemo 10/29/19  Yes Alla Feeling, NP  diclofenac (VOLTAREN) 75 MG EC tablet Take 1 tablet (75 mg total) by mouth 2 (two) times daily as needed. Patient taking differently: Take 75 mg by mouth 2 (two) times daily. 12/12/19  Yes Shelly Coss, MD  Dietary Management Product (RHEUMATE) CAPS Take 1 capsule by mouth daily.    Yes [provider]  diphenoxylate-atropine (LOMOTIL) 2.5-0.025 MG tablet Take 2 tablets by mouth 4 (four) times daily as needed for diarrhea or loose stools (use 2nd, if Imodium doesn't work. Max dose: 8 tablets/day.). Patient taking differently: Take 2 tablets by mouth 4 (four) times daily as needed for diarrhea or loose stools. 11/09/19  Yes Orson Slick, MD  dronabinol (MARINOL) 2.5 MG capsule Take 1 capsule (2.5 mg total) by mouth 2 (two) times daily before a meal. Patient taking differently: Take 2.5 mg by mouth 2 (two) times daily as needed (nausea). 11/14/19  Yes Truitt Merle, MD  enoxaparin (LOVENOX) 80 MG/0.8ML injection Inject 0.8 mLs (80 mg total) into  the skin every 12 (twelve) hours. 01/09/20 04/08/20 Yes Alla Feeling, NP  ferrous sulfate 325 (65 FE) MG tablet Take 325 mg by mouth daily with breakfast.   Yes [provider]  fluticasone (FLONASE) 50 MCG/ACT nasal spray Place 2 sprays into both nostrils daily. Patient taking differently: Place 2 sprays into both nostrils daily as needed for allergies. 04/29/16  Yes Jeffery, Domingo Mend, PA  hydroxychloroquine (PLAQUENIL) 200 MG tablet Take 400 mg by mouth daily.  06/17/14  Yes [provider]  lipase/protease/amylase 24000-76000 units CPEP Take 1 capsule (24,000 Units total) by mouth 3 (three) times daily with meals. 12/12/19  Yes Shelly Coss, MD  loperamide (IMODIUM) 2 MG capsule Take 2 capsules (4  mg total) by mouth 2 (two) times daily as needed for diarrhea or loose stools (Use 1st). 11/09/19  Yes Hongalgi, Lenis Dickinson, MD  LORazepam (ATIVAN) 0.5 MG tablet Take 1 tablet (0.5 mg total) by mouth every 8 (eight) hours as needed for anxiety (and anticiaptory n/v. DO NOT TAKE WITH XANAX). 11/21/19  Yes Alla Feeling, NP  metoprolol succinate (TOPROL-XL) 50 MG 24 hr tablet Take 50 mg by mouth daily.    Yes [provider]  mirtazapine (REMERON) 7.5 MG tablet Take 1 tablet (7.5 mg total) by mouth at bedtime. 01/08/20  Yes Alla Feeling, NP  OLANZapine (ZYPREXA) 7.5 MG tablet Take 1 tablet (7.5 mg total) by mouth at bedtime. 12/12/19  Yes Shelly Coss, MD  ondansetron (ZOFRAN) 8 MG tablet Take 1 tablet (8 mg total) by mouth every 8 (eight) hours as needed for nausea or vomiting. 12/24/19  Yes Truitt Merle, MD  oxyCODONE (OXY IR/ROXICODONE) 5 MG immediate release tablet Take 1 tablet (5 mg total) by mouth every 6 (six) hours as needed for severe pain. 01/24/20  Yes Truddie Hidden, MD  senna-docusate (SENOKOT-S) 8.6-50 MG tablet Take 1 tablet by mouth 2 (two) times daily. Patient taking differently: Take 1 tablet by mouth 2 (two) times daily as needed for mild constipation.  12/12/19  Yes Shelly Coss, MD  sucralfate (CARAFATE) 1 GM/10ML suspension Take 10 mLs (1 g total) by mouth 4 (four) times daily -  with meals and at bedtime. 12/12/19  Yes Shelly Coss, MD  topiramate (TOPAMAX) 50 MG tablet Take 1 tablet (50 mg total) by mouth at bedtime. Patient taking differently: Take 50 mg by mouth daily as needed (migraines). 11/09/19  Yes Hongalgi, Lenis Dickinson, MD  losartan-hydrochlorothiazide (HYZAAR) 50-12.5 MG tablet Take 1 tablet by mouth daily. 01/21/20   [provider]  prochlorperazine (COMPAZINE) 10 MG tablet Take 1 tablet (10 mg total) by mouth every 6 (six) hours as needed (Nausea or vomiting). Patient taking differently: Take 10 mg by mouth every 6 (six) hours as needed for nausea or vomiting.  10/18/19 12/18/19  Truitt Merle, MD    Physical Exam: Vitals:   02/01/20 0515 02/01/20 0545 02/01/20 0600 02/01/20 0630  BP: (!) 140/94 (!) 145/95 (!) 159/106 (!) 148/99  Pulse: (!) 144 (!) 138 (!) 146 (!) 133  Resp: (!) 24 (!) 29 15 16   Temp:      TempSrc:      SpO2: 100% 98% 97% 96%  Weight:      Height:        General: 60 y.o. female resting in bed in NAD Eyes: PERRL, normal sclera ENMT: Nares patent w/o discharge, orophaynx clear, dentition normal, ears w/o discharge/lesions/ulcers Neck: Supple, trachea midline Cardiovascular: tachy, +S1, S2, no m/g/r, equal pulses throughout Respiratory: CTABL, no w/r/r, normal WOB GI: BS+, ND, LUQ TTP, no masses noted, no organomegaly noted MSK: No e/c/c Skin: No rashes, bruises, ulcerations noted Neuro: A&O x 3, no focal deficits Psyc: Appropriate interaction and affect, calm/cooperative  Labs on Admission: I have personally reviewed following labs and imaging studies  CBC: Recent Labs  Lab 01/29/20 1056 02/01/20 0108  WBC 13.5* 11.3*  NEUTROABS 10.7* 9.9*  HGB 10.8* 9.4*  HCT 33.7* 30.4*  MCV 91.1 95.9  PLT 261 [id number]*   Basic Metabolic Panel: Recent Labs  Lab 01/29/20 1056 02/01/20 0108  NA  141 137  K 3.6 4.0  CL 99 103  CO2 34* 25  GLUCOSE 119* 165*  BUN 21*  19  CREATININE 0.71 0.64  CALCIUM 9.0 8.6*   GFR: Estimated Creatinine Clearance: 74.5 mL/min (by C-G formula based on SCr of 0.64 mg/dL). Liver Function Tests: Recent Labs  Lab 01/29/20 1056 02/01/20 0108  AST 96* 119*  ALT 69* 63*  ALKPHOS 378* 226*  BILITOT 1.7* 2.5*  PROT 8.6* 7.5  ALBUMIN 2.5* 2.4*   Recent Labs  Lab 02/01/20 0108  LIPASE 20   No results for input(s): AMMONIA in the last 168 hours. Coagulation Profile: Recent Labs  Lab 02/01/20 0132  INR 1.4*   Cardiac Enzymes: No results for input(s): CKTOTAL, CKMB, CKMBINDEX, TROPONINI in the last 168 hours. BNP (last 3 results) No results for input(s): PROBNP in the last 8760 hours. HbA1C: No results for input(s): HGBA1C in the last 72 hours. CBG: No results for input(s): GLUCAP in the last 168 hours. Lipid Profile: No results for input(s): CHOL, HDL, LDLCALC, TRIG, CHOLHDL, LDLDIRECT in the last 72 hours. Thyroid Function Tests: No results for input(s): TSH, T4TOTAL, FREET4, T3FREE, THYROIDAB in the last 72 hours. Anemia Panel: No results for input(s): VITAMINB12, FOLATE, FERRITIN, TIBC, IRON, RETICCTPCT in the last 72 hours. Urine analysis:    Component Value Date/Time   COLORURINE AMBER (A) 02/01/2020 0200   APPEARANCEUR CLEAR 02/01/2020 0200   LABSPEC 1.016 02/01/2020 0200   PHURINE 9.0 (H) 02/01/2020 0200   GLUCOSEU NEGATIVE 02/01/2020 0200   HGBUR NEGATIVE 02/01/2020 0200   BILIRUBINUR NEGATIVE 02/01/2020 0200   KETONESUR NEGATIVE 02/01/2020 0200   PROTEINUR NEGATIVE 02/01/2020 0200   NITRITE NEGATIVE 02/01/2020 0200   LEUKOCYTESUR NEGATIVE 02/01/2020 0200    Radiological Exams on Admission: CT ANGIO CHEST PE W OR WO CONTRAST  Result Date: 02/01/2020 CLINICAL DATA:  Increasing right rib pain for 1 day. Nausea and vomiting EXAM: CT ANGIOGRAPHY CHEST WITH CONTRAST TECHNIQUE: Multidetector CT imaging of the chest was  performed using the standard protocol during bolus administration of intravenous contrast. Multiplanar CT image reconstructions and MIPs were obtained to evaluate the vascular anatomy. CONTRAST:  176mL OMNIPAQUE IOHEXOL 350 MG/ML SOLN COMPARISON:  01/11/2020 FINDINGS: Cardiovascular: Normal heart size. No pericardial effusion. No acute aortic finding. Extensive motion and streak artifact. Pulmonary emboli are again seen at the bifurcation of the right main pulmonary artery, peripheral within the vessel and nonacute. More peripheral pulmonary arteries are not reliably characterized due to the degree of artifact. No definite progression from 01/11/2020. Mediastinum/Nodes: Mild enlargement of bilateral axillary lymph nodes which may be related to history of connective tissue disease. No mediastinal adenopathy. Lungs/Pleura: Reportedly patient is COVID positive per the chart. There is no edema, consolidation, effusion, or pneumothorax. Small opacity at the lingula which has a scar-like appearance, also seen on prior. No suspicious pulmonary nodules. Upper Abdomen: Reported separately Musculoskeletal: No acute finding. Review of the MIP images confirms the above findings. IMPRESSION: 1. Very limited CTA due to the extent of motion and streak artifact. Persisting nonacute right-sided pulmonary emboli as seen on 01/11/2020 chest CTA. No clear progression of emboli. DVT is present at the left common femoral vein on prior abdominal CT. 2. COVID positivity without pneumonia. Electronically Signed   By: Monte Fantasia M.D.   On: 02/01/2020 04:53   CT ABDOMEN PELVIS W CONTRAST  Result Date: 02/01/2020 CLINICAL DATA:  Acute right flank pain.  Pancreatic cancer EXAM: CT ABDOMEN AND PELVIS WITH CONTRAST TECHNIQUE: Multidetector CT imaging of the abdomen and pelvis was performed using the standard protocol following bolus administration of intravenous contrast. CONTRAST:  142mL  OMNIPAQUE IOHEXOL 350 MG/ML SOLN COMPARISON:   01/24/2020 FINDINGS: Lower chest:  Reported separately Hepatobiliary: Innumerable metastases throughout the liver. Gallbladder sludge or calculi is likely. No evidence of acute cholecystitis. Pancreas: Mass at the pancreatic head with marked proximal ductal dilatation and atrophy. The mass measures approximately 3 cm. No acute superimposed inflammation. Spleen: Unremarkable. Adrenals/Urinary Tract: Negative adrenals. There is a band of low-density in the posterior and lower left kidney. No hydronephrosis. Unremarkable bladder. Stomach/Bowel: Extensive submucosal low-density within small bowel proximal colon which appears fairly well-defined and has the appearance of fat deposition, also seen on prior but more prominent today. No bowel obstruction or clear bowel edema. Gastric outlet is distorted by the pancreatic mass, but no gastric dilatation. The appendix is not well visualized. No pericecal inflammation. Vascular/Lymphatic: DVT in the left common femoral vein which is resolved by the level of the common femoral bifurcation. This finding will be called in conjunction with chest CT findings. Prominent lymph nodes in the deep liver drainage about the pancreatic head, similar and likely metastatic. Reproductive:Hysterectomy. Other: Small volume ascites in the pelvis. Minimal interloop fluid is also seen. No discrete peritoneal nodularity but there is likely mild thickening of the peritoneum the upper right pericolic gutter. Musculoskeletal: No acute abnormalities. IMPRESSION: 1. DVT in the left common femoral vein, nonobstructive. 2. Band of hypoenhancement in the lower left kidney, contralateral to site of symptoms. Pyelonephritis is considered, please correlate with urinalysis. 3. Known pancreas cancer with extensive liver metastases. 4. Small volume ascites with possible peritoneal thickening, concerning for early peritoneal metastatic disease. 5. Gallbladder sludge or calculi. No evidence of acute cholecystitis.  Electronically Signed   By: Monte Fantasia M.D.   On: 02/01/2020 04:47   DG Chest Port 1 View  Result Date: 02/01/2020 CLINICAL DATA:  Sepsis, right anterior chest pain, history of pancreatic cancer EXAM: PORTABLE CHEST 1 VIEW COMPARISON:  11/24/2019 FINDINGS: Single frontal view of the chest demonstrates stable left chest wall port. Cardiac silhouette is unremarkable. Lung volumes are diminished, with no acute airspace disease, effusion, or pneumothorax. No acute bony abnormalities. IMPRESSION: 1. Low lung volumes.  No acute process. Electronically Signed   By: Randa Ngo M.D.   On: 02/01/2020 01:32    EKG: Independently reviewed. Sinus tach, no st changes, prolonged Qtc  Assessment/Plan Sepsis secondary to unknown source     - admit to inpt, progressive     - initial source believed to be pyelo, but UA is clear     - will broaden out abx     - follow Bld Cx, UCx     - don't think COVID is driving this as she has no respiratory symptoms; will check inflammatory markers     - fluids  COVID infection     - sats 100% on RA, no respiratory symptoms, no lesions on imaging     - she will get a dose of steroids today, but mainly for post chemo nausea     - holding on remdes for now  N/V/ab pain Hx of pancreatic CA on active chemo     - Onco notified pt is here and they will follow     - she regularly has episodes of N/V/ab pain after chemo     - palliative care consult is appropriate; will await word from onco first  Hx of HTN     - hypotensive now, hold home meds  Hx of SLE     - continue plaquenil  Normocytic anemia     -  no evidence of bleed     - likely multifactorial (ACD and Fe2+ deficiency -- she is on supplements); follow  GERD     - protonix  DVT prophylaxis: full dose lovenox  Code Status: FULL  Family Communication: None at bedside.  Consults called: Onco notified she is here (Dr. Burr Medico).   Status is: Inpatient  Remains inpatient appropriate because:Inpatient  level of care appropriate due to severity of illness   Dispo: The patient is from: Home              Anticipated d/c is to: Home              Anticipated d/c date is: > 3 days              Patient currently is not medically stable to d/c.  Jonnie Finner DO [facility]  If 7PM-7AM, please contact night-coverage [email or web address]  02/01/2020, 8:10 AM

## 2020-02-01 NOTE — Telephone Encounter (Signed)
Request for 90 day  supply

## 2020-02-01 NOTE — Progress Notes (Signed)
Pharmacy Antibiotic Note  Cynthia Frye is a 60 y.o. female admitted on 02/01/2020 with sepsis.  Pharmacy has been consulted for vanc/cefepime dosing.  Plan: 1) Vanc 1500mg  IV x 1 then 1g IV q12 - goal AUC 400-500 2) Cefepime 2g IV q8  Height: 5\' 7"  (170.2 cm) Weight: 72.6 kg (160 lb) IBW/kg (Calculated) : 61.6  Temp (24hrs), Avg:100.7 F (38.2 C), Min:100.2 F (37.9 C), Max:101.1 F (38.4 C)  Recent Labs  Lab 01/29/20 1056 02/01/20 0108 02/01/20 0337  WBC 13.5* 11.3*  --   CREATININE 0.71 0.64  --   LATICACIDVEN  --  2.5* 2.0*    Estimated Creatinine Clearance: 74.5 mL/min (by C-G formula based on SCr of 0.64 mg/dL).    Allergies  Allergen Reactions  . Cinnamon Other (See Comments)    Other reaction(s): Other (See Comments) Unknown  On allergy test Unknown  On allergy test  . Peanut-Containing Drug Products Hives  . Prednisone Other (See Comments)    Interacts with another medicine she is taking  . Corn Oil Hives  . Corn-Containing Products Hives  . Other Rash    Red grapefruit and naval oranges- lips tingling and facial rash Potatoes, tomatoes, garlic, oregano/basil caused headache Other reaction(s): migratory headache     Thank you for allowing pharmacy to be a part of this patient's care.  Kara Mead 02/01/2020 9:23 AM

## 2020-02-01 NOTE — ED Provider Notes (Signed)
[facility] Emergency Department Provider Note MRN:  [id number]  Arrival date & time: 02/01/20     Chief Complaint   Right Rib Pain    History of Present Illness   Cynthia Frye is a 60 y.o. year-old female with a history of pancreatic cancer presenting to the ED with chief complaint of right rib pain.  Pain in the right lateral ribs, right flank, right abdomen, severe and progressing over the past 24 hours.  Associated with nausea, vomiting, cough.  Also with fever, heart racing.  Denies headache or vision change, denies shortness of breath, no burning with urination.  Review of Systems  A complete 10 system review of systems was obtained and all systems are negative except as noted in the HPI and PMH.   Patient's Health History    Past Medical History:  Diagnosis Date  . Allergies    peanuts, corn, beans, red grapefruit, naval oranges  . Asthma    allergy shots and medication  . Cancer [facility])    pancreatic  . Collagen vascular disease ([place])   . DDD (degenerative disc disease), lumbar   . Eustachian tube dysfunction 12/2012   rhinitis, vertigo- Dr. Wilburn Cornelia, ENT   . Fibroids   . Heart murmur    Echo 1/18: EF 55-60, no RWMA, normal diastolic function, trivial AI, PASP 32  . History of cardiac catheterization    LHC 3/18: normal coronary arteries.   . History of nuclear stress test    ETT-Myoview 2/18: EF 62, + ECG response; apical and distal septal ischemia; intermediate risk.  Marland Kitchen Hypertension   . Iron deficiency anemia   . Lupus [facility]) 2011   Dr. Trudie Reed  . Migraine headache    trial of generic maxalt 10 mg, January 2021  . Obesity   . Seasonal allergies   . Seizure in childhood [facility])    as a child no treatment none x 30 years    Past Surgical History:  Procedure Laterality Date  . ABDOMINAL HYSTERECTOMY    . BACK SURGERY  2009  . BIOPSY  12/10/2019   Procedure: BIOPSY;  Surgeon: Wilford Corner, MD;  Location: [facility];  Service:  Endoscopy;;  . COLONOSCOPY WITH PROPOFOL N/A 04/11/2012   Procedure: COLONOSCOPY WITH PROPOFOL;  Surgeon: Garlan Fair, MD;  Location: [facility] ENDOSCOPY;  Service: Endoscopy;  Laterality: N/A;  . ESOPHAGOGASTRODUODENOSCOPY (EGD) WITH PROPOFOL N/A 12/10/2019   Procedure: ESOPHAGOGASTRODUODENOSCOPY (EGD) WITH PROPOFOL;  Surgeon: Wilford Corner, MD;  Location: [facility];  Service: Endoscopy;  Laterality: N/A;  . HERNIA REPAIR     as child unbilical  . 0000000 partial discectomy and laminectomy     Dr. Christella Noa  . LEFT HEART CATH AND CORONARY ANGIOGRAPHY N/A 04/09/2016   Procedure: Left Heart Cath and Coronary Angiography;  Surgeon: Nelva Bush, MD;  Location: [facility];  Service: Cardiovascular;  Laterality: N/A;  . MOUTH SURGERY    . PORTACATH PLACEMENT N/A 10/11/2019   Procedure: INSERTION PORT-A-CATH LEFT SUBCLAVIAN;  Surgeon: [name], MD;  Location: [facility];  Service: General;  Laterality: N/A;    Family History  Problem Relation Age of Onset  . Asthma Mother   . Diabetes Mother   . Hypertension Mother   . Heart attack Father 7  . CAD Father   . Stroke Maternal Aunt   . Stroke Paternal Aunt   . Stroke Paternal Uncle   . Gout Other        uncles   .  Cancer Niece        paternal half sister's daughter; unknown type; unknown age diagnosed  . Ovarian cancer Neg Hx   . Breast cancer Neg Hx   . Colon cancer Neg Hx   . Migraines Neg Hx     Social History   Socioeconomic History  . Marital status: Married    Spouse name: Not on file  . Number of children: 0  . Years of education: Not on file  . Highest education level: Some college, no degree  Occupational History  . Occupation: Customer service manager   Tobacco Use  . Smoking status: Never Smoker  . Smokeless tobacco: Never Used  Vaping Use  . Vaping Use: Never used  Substance and Sexual Activity  . Alcohol use: No  . Drug use: No  . Sexual activity: Not on file  Other Topics Concern  . 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)   Married   No children   Native to [place]; [facility] for a while      Left handed   Caffeine: soda, about 2-3 cans per day    Social Determinants of Health   Financial Resource Strain: Not on file  Food Insecurity: Not on file  Transportation Needs: Not on file  Physical Activity: Not on file  Stress: Not on file  Social Connections: Not on file  Intimate Partner Violence: Not on file     Physical Exam   Vitals:   02/01/20 0400 02/01/20 0515  BP: 138/88 (!) 140/94  Pulse: (!) 134 (!) 144  Resp: (!) 22 (!) 24  Temp:    SpO2: 99% 100%    CONSTITUTIONAL: Well-appearing, NAD NEURO:  Alert and oriented x 3, no focal deficits EYES:  eyes equal and reactive ENT/NECK:  no LAD, no JVD CARDIO: Tachycardic rate, well-perfused, normal S1 and S2 PULM:  CTAB no wheezing or rhonchi GI/GU:  normal bowel sounds, non-distended, non-tender MSK/SPINE:  No gross deformities, no edema SKIN:  no rash, atraumatic PSYCH:  Appropriate speech and behavior  *Additional and/or pertinent findings included in MDM below  Diagnostic and Interventional Summary    EKG Interpretation  Date/Time:  Friday February 01 2020 00:38:44 EST Ventricular Rate:  152 PR Interval:    QRS Duration: 74 QT Interval:  348 QTC Calculation: 554 R Axis:   -6 Text Interpretation: Sinus tachycardia Anterior infarct, old Prolonged QT interval Confirmed by Gerlene Fee [phone]) on 02/01/2020 1:04:21 AM      Labs Reviewed  RESP PANEL BY RT-PCR (FLU A&B, COVID) ARPGX2 - Abnormal; Notable for the following components:      Result Value   SARS Coronavirus 2 by RT PCR POSITIVE (*)    All other components within normal limits  LACTIC ACID, PLASMA - Abnormal; Notable for the following components:   Lactic Acid, Venous 2.5 (*)    All other components within normal limits  LACTIC ACID, PLASMA - Abnormal;  Notable for the following components:   Lactic Acid, Venous 2.0 (*)    All other components within normal limits  COMPREHENSIVE METABOLIC PANEL - Abnormal; Notable for the following components:   Glucose, Bld 165 (*)    Calcium 8.6 (*)    Albumin 2.4 (*)    AST 119 (*)    ALT 63 (*)    Alkaline Phosphatase 226 (*)    Total Bilirubin 2.5 (*)    All other components  within normal limits  CBC WITH DIFFERENTIAL/PLATELET - Abnormal; Notable for the following components:   WBC 11.3 (*)    RBC 3.17 (*)    Hemoglobin 9.4 (*)    HCT 30.4 (*)    RDW 17.2 (*)    Platelets 139 (*)    Neutro Abs 9.9 (*)    All other components within normal limits  URINALYSIS, ROUTINE W REFLEX MICROSCOPIC - Abnormal; Notable for the following components:   Color, Urine AMBER (*)    pH 9.0 (*)    All other components within normal limits  PROTIME-INR - Abnormal; Notable for the following components:   Prothrombin Time 16.3 (*)    INR 1.4 (*)    All other components within normal limits  APTT - Abnormal; Notable for the following components:   aPTT 49 (*)    All other components within normal limits  CULTURE, BLOOD (ROUTINE X 2)  CULTURE, BLOOD (ROUTINE X 2)  URINE CULTURE  LIPASE, BLOOD  PREGNANCY, URINE  TROPONIN I (HIGH SENSITIVITY)  TROPONIN I (HIGH SENSITIVITY)    CT ANGIO CHEST PE W OR WO CONTRAST  Final Result    CT ABDOMEN PELVIS W CONTRAST  Final Result    DG Chest Port 1 View  Final Result      Medications  lactated ringers bolus 1,000 mL (has no administration in time range)  lactated ringers bolus 1,500 mL (0 mLs Intravenous Stopped 02/01/20 0259)  cefTRIAXone (ROCEPHIN) 2 g in sodium chloride 0.9 % 100 mL IVPB (0 g Intravenous Stopped 02/01/20 0152)  HYDROmorphone (DILAUDID) injection 0.5 mg (0.5 mg Intravenous Given 02/01/20 0118)  iohexol (OMNIPAQUE) 350 MG/ML injection 100 mL (100 mLs Intravenous Contrast Given 02/01/20 0417)     Procedures  /  Critical Care .Critical  Care Performed by: Maudie Flakes, MD Authorized by: Maudie Flakes, MD   Critical care provider statement:    Critical care time (minutes):  35   Critical care was necessary to treat or prevent imminent or life-threatening deterioration of the following conditions: Concern for possible sepsis.   Critical care was time spent personally by me on the following activities:  Discussions with consultants, evaluation of patient's response to treatment, examination of patient, ordering and performing treatments and interventions, ordering and review of laboratory studies, ordering and review of radiographic studies, pulse oximetry, re-evaluation of patient's condition, obtaining history from patient or surrogate and review of old charts    ED Course and Medical Decision Making  I have reviewed the triage vital signs, the nursing notes, and pertinent available records from the EMR.  Listed above are laboratory and imaging tests that I personally ordered, reviewed, and interpreted and then considered in my medical decision making (see below for details).  Patient arrives febrile and significantly tachycardic.  History of pancreatic cancer on chemotherapy.  Right rib/flank pain, considering sepsis, PE, considering perforated viscus or other intra-abdominal infection or complication of pancreatic cancer.  Initially thought that patient's rhythm was atrial flutter but twelve-lead seems more consistent with sinus tachycardia.  Providing IV fluids, empiric antibiotics.     Testing is overall reassuring, patient has tested positive for Covid which may be the driving factor regarding her fever and tachycardia.  CT imaging is not showing significant increased PE burden, though it is a limited study.  No cancer related complication in the abdomen.  Question of pyelonephritis but urinalysis is normal.  Patient is persistently tachycardic despite 2 and half liters of fluid, admitted to  medicine given that she is on  active chemotherapy.  Barth Kirks. Sedonia Small, MD [place] [email or web address]  Final Clinical Impressions(s) / ED Diagnoses     ICD-10-CM   1. COVID-19  U07.1   2. Fever, unspecified fever cause  R50.9     ED Discharge Orders    None       Discharge Instructions Discussed with and Provided to Patient:   Discharge Instructions   None       Maudie Flakes, MD 02/01/20 [phone]

## 2020-02-01 NOTE — ED Triage Notes (Signed)
Pt reports pain in the right side/ rib area all day today. Pain began getting worse and could not tolerate. Pt sts 3 episodes of vomiting. Hx pancreatic cancer and last treatment was pastTuesday.

## 2020-02-01 NOTE — Progress Notes (Addendum)
HEMATOLOGY-ONCOLOGY PROGRESS NOTE  SUBJECTIVE: The patient is well-known to our service. 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 She initially received first-line chemotherapy with FOLFIRINOX but due to poor tolerance this was stopped after 2 cycles. She was then switched to second line gemcitabine 2 weeks on/1 week off starting on 12/28/2019. Scans show continued disease progression. She has had a very limited response to chemotherapy and has had difficulty tolerating chemotherapy. She was seen in our office earlier this week. She received single agent gemcitabine which was dose reduced on 01/29/2020. We had planned to discuss adding low-dose Abraxane versus stopping chemotherapy at her next visit with [place].  She presented to the emergency room with nausea, vomiting, abdominal pain. She was found to be septic in ER. Source possibly pyelonephritis based on imaging. Has been started on IV antibiotics. A CTA of the chest and a CT of the abdomen pelvis were performed which showed a DVT in the left common femoral vein, known pancreas cancer with extensive liver metastases, and small volume ascites with peritoneal thickening concerning for early peritoneal metastatic disease. Found to be Covid positive but not having any respiratory symptoms. She was febrile when she initially presented to the ER. Other than fever, abdominal pain, nausea, vomiting, review of systems negative.  Oncology History Overview Note  Cancer Staging Pancreatic cancer [facility]) Staging form: Exocrine Pancreas, AJCC 8th Edition - Clinical stage from 10/18/2019: Stage IV (cT3, cN1, cM1) - Signed by Truitt Merle, MD on 10/18/2019    Pancreatic cancer ([place])  09/19/2019 Imaging   CT AP w contrast 09/19/19  IMPRESSION: 1. Findings are highly concerning for probable primary pancreatic adenocarcinoma in the anterior aspect of the pancreatic head. Several prominent borderline enlarged lymph nodes are noted in the hepatoduodenal nodal  station, and there are multiple indeterminate liver lesions which are highly concerning for probable hepatic metastases. Further evaluation with nonemergent abdominal MRI with and without IV gadolinium with MRCP is recommended in the near future to better evaluate these findings.   09/25/2019 Procedure   Upper EUS by Dr Paulita Fujita  IMPRESSION -There was no evidence of significant pathology in the left lobe of the liver.  -A few lymph nodes were visualized and measures in the peripancreatic region and porta hepata region.  -Hyperchoic material consistent with sludge was visualized endosonographically in the gallbladder.  -There was no sign of significant pathology in the common bile duct.  -A mass was identified in the pancreatic head. If biopsy results show adenocarcinoma, it would be staged T3N1Mx by endosonographic criteria. Fine needle aspiration performed.    09/25/2019 Initial Biopsy   FINAL MICROSCOPIC DIAGNOSIS: Fine needle aspirate, Pancreas;  MALIGNANT CELLS PRESENT CONSISTENT WITH ADENOCARCINOMA.    10/02/2019 Initial Diagnosis   Pancreatic cancer ([place])   10/11/2019 Procedure   PAC placement y Dr Barry Dienes    10/15/2019 Imaging   CT Chest  IMPRESSION: 1. Small anterior left pneumothorax with dependent atelectasis in the left lower lobe. 2. Increased number of bilateral axillary and subpectoral lymph nodes with mild lymphadenopathy in the left axilla. While this would be an atypical presentation for metastatic pancreatic cancer, this possibility is not excluded 3. Main duct dilatation in the pancreas, better assessed on abdomen CT 09/19/2019.   10/16/2019 Imaging   MRI Abdomen  IMPRESSION: 1. Substantially motion degraded scan. 2. Probable persistent small anterior left lung base pneumothorax, better seen on chest CT from 1 day prior. 3. Poorly marginated hypoenhancing 3.7 x 2.9 cm pancreatic head mass, which appears to invade  the anterior peripancreatic fat, compatible with  known pancreatic adenocarcinoma. Diffuse irregular dilatation of the main pancreatic duct in the pancreatic body and tail. Mild narrowing of the main portal vein by the mass. Abdominal vasculature remains patent and otherwise uninvolved. 4. Numerous (greater than 10) small liver masses scattered throughout the liver, largest 1.0 cm, which appear to demonstrate targetoid enhancement on the limited motion degraded postcontrast sequences, compatible with liver metastases. 5. Mild porta hepatis adenopathy, suspicious for metastatic disease.   10/18/2019 Cancer Staging   Staging form: Exocrine Pancreas, AJCC 8th Edition - Clinical stage from 10/18/2019: Stage IV (cT3, cN1, cM1) - Signed by Truitt Merle, MD on 10/18/2019   10/24/2019 - 11/21/2019 Chemotherapy   First-line FOLFIRINOX q2weeks starting 10/24/19. C2 postponed due to N/V/D and dose reduced 20-30%. Given poor tolerance, stopped after 2 cycles.    10/30/2019 Pathology Results   FINAL MICROSCOPIC DIAGNOSIS:   A. LIVER, LESION, BIOPSY:  -  Metastatic carcinoma  -  See comment   COMMENT:   By immunohistochemistry, the neoplastic cells are positive for  cytokeratin 7 and GATA3 with patchy nonspecific staining for PAX 8 but  negative for TTF-1, CDX2 and cytokeratin 20.  Overall, the findings are  consistent with metastasis of the patient's known breast carcinoma.  Prognostic panel (ER, PR, Her-2) is pending and will be reported in an  addendum.    ADDENDUM:   Dr. Laurence Ferrari notified us (November 01, 2019) that the patient was also  being worked up for a pancreatic mass.  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  In addition, ER and PR  are negative.  Gata-3 can be expressed in the pancreatic  adenocarcinomas; therefore, pancreatobiliary primary remains in the  differential.    11/02/2019 Genetic Testing   Negative genetic testing: no pathogenic variants detected in [employer] Common Hereditary  Cancers Panel.  The report date is November 02, 2019.   The Common Hereditary Cancers Panel offered by [employer] includes sequencing and/or deletion duplication testing of the following 48 genes: APC, ATM, AXIN2, BARD1, BMPR1A, BRCA1, BRCA2, BRIP1, CDH1, CDK4, CDKN2A (p14ARF), CDKN2A (p16INK4a), CHEK2, CTNNA1, DICER1, EPCAM (Deletion/duplication testing only), GREM1 (promoter region deletion/duplication testing only), KIT, MEN1, MLH1, MSH2, MSH3, MSH6, MUTYH, NBN, NF1, NHTL1, PALB2, PDGFRA, PMS2, POLD1, POLE, PTEN, RAD50, RAD51C, RAD51D, RNF43, SDHB, SDHC, SDHD, SMAD4, SMARCA4. STK11, TP53, TSC1, TSC2, and VHL.  The following genes were evaluated for sequence changes only: SDHA and HOXB13 c.251G>A variant only.   11/04/2019 Imaging   CT AP  IMPRESSION: 1. Circumferential bowel wall thickening with adjacent fat stranding throughout the colon, most predominant in the transverse colon. This is consistent with colitis, which may be infectious or inflammatory in etiology. 2. Ill-defined pancreatic head mass consistent with known malignancy, similar to mildly increased in comparison to prior CT. There are innumerable hypodense masses throughout the liver, increased in conspicuity in comparison to prior CT. Findings are worrisome for worsening metastatic disease. 3. Filling defect in the LEFT internal iliac vein, likely a thrombus with differential considerations including mixing artifact.   11/24/2019 Imaging   CT AP  IMPRESSION: Pancreatic head mass again noted compatible with patient's given history of pancreatic cancer.   Numerous metastases throughout the liver, enlarging since prior study.     12/10/2019 Procedure   Upper endoscopy by Dr Michail Sermon  IMPRESSION - Z-line regular, 42 cm from the incisors. - Esophageal ulcer with stigmata of recent bleeding. - Gastritis. Biopsied. - Mucosal changes in the  duodenum. Biopsied. - Normal second portion of the duodenum. - Gastric and duodenal  thickening concerning for inflammation from mets or due to mets from known pancreatic cancer.   FINAL MICROSCOPIC DIAGNOSIS:   A. DUODENUM, BIOPSY:  - Benign duodenal mucosa.  - No dysplasia or malignancy.   B. STOMACH, BIOPSY:  - Antral mucosa with mild chronic active inflammation.  - No intestinal metaplasia, dysplasia or carcinoma.   12/28/2019 -  Chemotherapy   Change her to second-line Gemcitabine 2 weeks on/1 week off starting 12/28/19.    01/16/2020 Imaging   MRI abdomen  IMPRESSION: 1. Prominently dilated dorsal pancreatic duct extending to the level of the mass in the junction of the pancreatic head and body. The mass abuts the gastric wall, portal vein, and common bile duct, leading to mild narrowing of the common bile duct down to about 0.3 cm, with the common hepatic duct mildly prominent 0.9 cm. The mass has mildly enlarged compared to the CT examination of 11/24/2019. 2. Extensive metastatic burden throughout all lobes of the liver, substantially increased from 11/24/2019 and fairly similar to 01/11/2020. 3. Trace left pleural effusion with adjacent atelectasis in the left lower lobe. Borderline cardiomegaly. 4. Sludge in the gallbladder with gallbladder wall thickening.   01/24/2020 Imaging   CT AP  IMPRESSION: 1. No acute abnormality. 2. No significant change in the known pancreatic malignancy with associated marked dilatation of the pancreatic duct. 3. Stable extensive liver metastatic disease.      REVIEW OF SYSTEMS:   Constitutional: Positive for fever and fatigue Eyes: Denies blurriness of vision Ears, nose, mouth, throat, and face: Denies mucositis or sore throat Respiratory: Denies cough, dyspnea or wheezes Cardiovascular: Denies palpitation, chest discomfort Gastrointestinal: Positive for abdominal pain, nausea, vomiting Skin: Denies abnormal skin rashes Lymphatics: Denies new lymphadenopathy or easy bruising Neurological:Denies numbness,  tingling or new weaknesses Behavioral/Psych: Mood is stable, no new changes  Extremities: No lower extremity edema All other systems were reviewed with the patient and are negative.  I have reviewed the past medical history, past surgical history, social history and family history with the patient and they are unchanged from previous note.   PHYSICAL EXAMINATION: ECOG PERFORMANCE STATUS: 2 - Symptomatic, <50% confined to bed  Vitals:   02/01/20 1115 02/01/20 1130  BP:  (!) 123/95  Pulse:  (!) 130  Resp: 18 17  Temp:    SpO2:  99%   Filed Weights   02/01/20 0020  Weight: 72.6 kg    Intake/Output from previous day: 01/06 0701 - 01/07 0700 In: 1600 [IV Piggyback:1600] Out: -   Exam not performed due to positive COVID-19 status.  LABORATORY DATA:  I have reviewed the data as listed CMP Latest Ref Rng & Units 02/01/2020 01/29/2020 01/24/2020  Glucose 70 - 99 mg/dL 165(H) 119(H) 100(H)  BUN 6 - 20 mg/dL 19 21(H) 15  Creatinine 0.44 - 1.00 mg/dL 0.64 0.71 0.91  Sodium 135 - 145 mmol/L 137 141 134(L)  Potassium 3.5 - 5.1 mmol/L 4.0 3.6 2.8(L)  Chloride 98 - 111 mmol/L 103 99 97(L)  CO2 22 - 32 mmol/L 25 34(H) 24  Calcium 8.9 - 10.3 mg/dL 8.6(L) 9.0 8.8(L)  Total Protein 6.5 - 8.1 g/dL 7.5 8.6(H) 8.0  Total Bilirubin 0.3 - 1.2 mg/dL 2.5(H) 1.7(H) 2.3(H)  Alkaline Phos 38 - 126 U/L 226(H) 378(H) 510(H)  AST 15 - 41 U/L 119(H) 96(H) 162(H)  ALT 0 - 44 U/L 63(H) 69(H) 121(H)    Lab Results  Component Value Date   WBC 11.3 (H) 02/01/2020   HGB 9.4 (L) 02/01/2020   HCT 30.4 (L) 02/01/2020   MCV 95.9 02/01/2020   PLT 139 (L) 02/01/2020   NEUTROABS 9.9 (H) 02/01/2020    CT ANGIO CHEST PE W OR WO CONTRAST  Result Date: 02/01/2020 CLINICAL DATA:  Increasing right rib pain for 1 day. Nausea and vomiting EXAM: CT ANGIOGRAPHY CHEST WITH CONTRAST TECHNIQUE: Multidetector CT imaging of the chest was performed using the standard protocol during bolus administration of intravenous  contrast. Multiplanar CT image reconstructions and MIPs were obtained to evaluate the vascular anatomy. CONTRAST:  152m OMNIPAQUE IOHEXOL 350 MG/ML SOLN COMPARISON:  01/11/2020 FINDINGS: Cardiovascular: Normal heart size. No pericardial effusion. No acute aortic finding. Extensive motion and streak artifact. Pulmonary emboli are again seen at the bifurcation of the right main pulmonary artery, peripheral within the vessel and nonacute. More peripheral pulmonary arteries are not reliably characterized due to the degree of artifact. No definite progression from 01/11/2020. Mediastinum/Nodes: Mild enlargement of bilateral axillary lymph nodes which may be related to history of connective tissue disease. No mediastinal adenopathy. Lungs/Pleura: Reportedly patient is COVID positive per the chart. There is no edema, consolidation, effusion, or pneumothorax. Small opacity at the lingula which has a scar-like appearance, also seen on prior. No suspicious pulmonary nodules. Upper Abdomen: Reported separately Musculoskeletal: No acute finding. Review of the MIP images confirms the above findings. IMPRESSION: 1. Very limited CTA due to the extent of motion and streak artifact. Persisting nonacute right-sided pulmonary emboli as seen on 01/11/2020 chest CTA. No clear progression of emboli. DVT is present at the left common femoral vein on prior abdominal CT. 2. COVID positivity without pneumonia. Electronically Signed   By: JMonte FantasiaM.D.   On: 02/01/2020 04:53   CT ANGIO CHEST PE W OR WO CONTRAST  Result Date: 01/11/2020 CLINICAL DATA:  Pancreatic cancer. Shortness of breath. Positive D-dimer. EXAM: CT ANGIOGRAPHY CHEST WITH CONTRAST TECHNIQUE: Multidetector CT imaging of the chest was performed using the standard protocol during bolus administration of intravenous contrast. Multiplanar CT image reconstructions and MIPs were obtained to evaluate the vascular anatomy. CONTRAST:  1065mOMNIPAQUE IOHEXOL 350 MG/ML  SOLN COMPARISON:  10/15/2019 FINDINGS: Cardiovascular: Pulmonary arterial opacification is satisfactory. No emboli are seen on the left. There are numerous pulmonary emboli present on the right. No large clot burden. No sign of right heart strain. Mediastinum/Nodes: No mediastinal or hilar mass or lymphadenopathy. Lungs/Pleura: Lungs are well aerated. No infiltrate, collapse or effusion. No evidence of pulmonary metastatic disease. Upper Abdomen: Widespread metastatic disease throughout the liver, probably progressive since September. Musculoskeletal: Negative Bilateral axillary lymphadenopathy, similar to the study of September. Review of the MIP images confirms the above findings. IMPRESSION: 1. Numerous but small pulmonary emboli on the right. No large clot burden or evidence of right heart strain. 2. Widespread hepatic metastatic disease, probably progressive since September. 3. These results will be called to the ordering clinician or representative by the Radiologist Assistant, and communication documented in the PACS or ClFrontier Oil CorporationElectronically Signed   By: MaNelson Chimes.D.   On: 01/11/2020 16:35   US[place]oreabdomen Complete  Result Date: 01/15/2020 CLINICAL DATA:  Acetic pancreatic cancer to the liver. New hyperbilirubinemia. Rule out biliary obstruction EXAM: ABDOMEN ULTRASOUND COMPLETE COMPARISON:  Ultrasound abdomen 10/30/2019 FINDINGS: Gallbladder: Sludge versus tiny gallstones within the gallbladder lumen. No sonographic Murphy sign noted by sonographer. Common bile duct: Diameter: 6 mm Liver: Innumerable hypodense solid hepatic masses  within bilateral hepatic lobes with the largest measuring 2.9 x 2.6 x 3 cm. Remainder of the hepatic demonstrates normal parenchymal echogenicity. Portal vein is patent on color Doppler imaging with normal direction of blood flow towards the liver. IVC: No abnormality visualized. Pancreas: Not visualized due to gaseous abdomen. Spleen: Size and appearance within  normal limits. Right Kidney: Length: 11.9 cm. Echogenicity within normal limits. No mass or hydronephrosis visualized. Left Kidney: Length: 11.8 cm. Echogenicity within normal limits. No mass or hydronephrosis visualized. Abdominal aorta: No aneurysm visualized. Other findings: None. IMPRESSION: 1. Gallbladder sludge versus cholelithiasis with no associated acute cholecystitis or choledocholithiasis. 2. Innumerable hepatic metastases. Electronically Signed   By: Iven Finn M.D.   On: 01/15/2020 17:36   CT ABDOMEN PELVIS W CONTRAST  Result Date: 02/01/2020 CLINICAL DATA:  Acute right flank pain.  Pancreatic cancer EXAM: CT ABDOMEN AND PELVIS WITH CONTRAST TECHNIQUE: Multidetector CT imaging of the abdomen and pelvis was performed using the standard protocol following bolus administration of intravenous contrast. CONTRAST:  147m OMNIPAQUE IOHEXOL 350 MG/ML SOLN COMPARISON:  01/24/2020 FINDINGS: Lower chest:  Reported separately Hepatobiliary: Innumerable metastases throughout the liver. Gallbladder sludge or calculi is likely. No evidence of acute cholecystitis. Pancreas: Mass at the pancreatic head with marked proximal ductal dilatation and atrophy. The mass measures approximately 3 cm. No acute superimposed inflammation. Spleen: Unremarkable. Adrenals/Urinary Tract: Negative adrenals. There is a band of low-density in the posterior and lower left kidney. No hydronephrosis. Unremarkable bladder. Stomach/Bowel: Extensive submucosal low-density within small bowel proximal colon which appears fairly well-defined and has the appearance of fat deposition, also seen on prior but more prominent today. No bowel obstruction or clear bowel edema. Gastric outlet is distorted by the pancreatic mass, but no gastric dilatation. The appendix is not well visualized. No pericecal inflammation. Vascular/Lymphatic: DVT in the left common femoral vein which is resolved by the level of the common femoral bifurcation. This  finding will be called in conjunction with chest CT findings. Prominent lymph nodes in the deep liver drainage about the pancreatic head, similar and likely metastatic. Reproductive:Hysterectomy. Other: Small volume ascites in the pelvis. Minimal interloop fluid is also seen. No discrete peritoneal nodularity but there is likely mild thickening of the peritoneum the upper right pericolic gutter. Musculoskeletal: No acute abnormalities. IMPRESSION: 1. DVT in the left common femoral vein, nonobstructive. 2. Band of hypoenhancement in the lower left kidney, contralateral to site of symptoms. Pyelonephritis is considered, please correlate with urinalysis. 3. Known pancreas cancer with extensive liver metastases. 4. Small volume ascites with possible peritoneal thickening, concerning for early peritoneal metastatic disease. 5. Gallbladder sludge or calculi. No evidence of acute cholecystitis. Electronically Signed   By: JMonte FantasiaM.D.   On: 02/01/2020 04:47   CT Abdomen Pelvis W Contrast  Result Date: 01/24/2020 CLINICAL DATA:  Abdominal pain for the past 2 days. Constipation. Ongoing chemotherapy for pancreatic cancer. EXAM: CT ABDOMEN AND PELVIS WITH CONTRAST TECHNIQUE: Multidetector CT imaging of the abdomen and pelvis was performed using the standard protocol following bolus administration of intravenous contrast. CONTRAST:  1014mOMNIPAQUE IOHEXOL 300 MG/ML  SOLN COMPARISON:  Abdomen MR dated 01/16/2020. Chest CTA dated 01/11/2020. Abdomen and pelvis CT dated 11/24/2019. FINDINGS: Lower chest: Mildly progressive linear and mass-like atelectasis at the left lung base with no significant change in linear atelectasis or scarring at the right lung base. Hepatobiliary: Large number of masses throughout the liver without significant change since 01/16/2020. Unremarkable gallbladder. Pancreas: No significant change in the mass  in the pancreas at the level of the junction of the pancreatic head and body since  01/16/2020 with associated marked dilatation of the pancreatic duct. Spleen: Unremarkable. Adrenals/Urinary Tract: Adrenal glands are unremarkable. Kidneys are normal, without renal calculi, focal lesion, or hydronephrosis. Bladder is unremarkable. Stomach/Bowel: Minimal diffuse enlargement of the appendix with mild diffuse low density wall thickening without periappendiceal soft tissue stranding. The appendix measures 9 mm in maximum diameter, similar to 11/24/2019. Unremarkable stomach, small bowel and colon. No significant stool in the colon. Vascular/Lymphatic: No significant vascular findings are present. No enlarged abdominal or pelvic lymph nodes. Reproductive: Status post hysterectomy. No adnexal masses. Other: No abdominal wall hernia or abnormality. No abdominopelvic ascites. Musculoskeletal: L5-S1 degenerative changes. IMPRESSION: 1. No acute abnormality. 2. No significant change in the known pancreatic malignancy with associated marked dilatation of the pancreatic duct. 3. Stable extensive liver metastatic disease. Electronically Signed   By: Claudie Revering M.D.   On: 01/24/2020 10:57   MR 3D Recon At Scanner  Result Date: 01/17/2020 CLINICAL DATA:  Jaundice, history of pancreatic cancer EXAM: MRI ABDOMEN WITHOUT AND WITH CONTRAST (INCLUDING MRCP) TECHNIQUE: Multiplanar multisequence MR imaging of the abdomen was performed both before and after the administration of intravenous contrast. Heavily T2-weighted images of the biliary and pancreatic ducts were obtained, and three-dimensional MRCP images were rendered by post processing. CONTRAST:  72m GADAVIST GADOBUTROL 1 MMOL/ML IV SOLN COMPARISON:  Multiple exams, including 11/24/2019 FINDINGS: Please note that images for today's exam span 2 different accession numbers, with post-contrast images in 1 record and precontrast images in the other record. Lower chest: Trace left pleural effusion with adjacent atelectasis in the left lower lobe. Trace right  pleural effusion. Borderline cardiomegaly. A right lower axillary lymph node measures 0.8 cm in short axis on image 3 of series 3. Hepatobiliary: Extensive metastatic burden throughout all lobes of the liver, substantially increased from 11/24/2019 and fairly similar to 01/11/2020. Sludge in the gallbladder with gallbladder wall thickening. Minimal intrahepatic biliary dilatation with the common hepatic duct at 0.9 cm in diameter, and narrowing of the common bile duct down to about 0.3 cm in diameter in the vicinity of the pancreatic mass due to extrinsic compression. Expected tapering course of the distal most CBD on image 12 of series 5. Pancreas: Prominently dilated dorsal pancreatic duct dilated side ducts, extending to the level of the mass in the junction of the pancreatic head and body. The mass measures 3.6 by 2.7 cm on image 64 series 7 and abuts the gastric wall, portal vein, and common bile duct along with obstructing the pancreatic duct. By my measurements, this primary mass was previously 3.5 by 2.5 cm on 11/24/2019. Spleen:  Unremarkable Adrenals/Urinary Tract:  Unremarkable Stomach/Bowel: Pancreatic mass abuts the posterior wall of the stomach. Vascular/Lymphatic: Small peripancreatic lymph nodes without overt pathologic adenopathy. Other:  No supplemental non-categorized findings. Musculoskeletal: Degenerative disc disease at L5-S1. IMPRESSION: 1. Prominently dilated dorsal pancreatic duct extending to the level of the mass in the junction of the pancreatic head and body. The mass abuts the gastric wall, portal vein, and common bile duct, leading to mild narrowing of the common bile duct down to about 0.3 cm, with the common hepatic duct mildly prominent 0.9 cm. The mass has mildly enlarged compared to the CT examination of 11/24/2019. 2. Extensive metastatic burden throughout all lobes of the liver, substantially increased from 11/24/2019 and fairly similar to 01/11/2020. 3. Trace left pleural  effusion with adjacent atelectasis in the left  lower lobe. Borderline cardiomegaly. 4. Sludge in the gallbladder with gallbladder wall thickening. Electronically Signed   By: Van Clines M.D.   On: 01/17/2020 08:29   DG Chest Port 1 View  Result Date: 02/01/2020 CLINICAL DATA:  Sepsis, right anterior chest pain, history of pancreatic cancer EXAM: PORTABLE CHEST 1 VIEW COMPARISON:  11/24/2019 FINDINGS: Single frontal view of the chest demonstrates stable left chest wall port. Cardiac silhouette is unremarkable. Lung volumes are diminished, with no acute airspace disease, effusion, or pneumothorax. No acute bony abnormalities. IMPRESSION: 1. Low lung volumes.  No acute process. Electronically Signed   By: Randa Ngo M.D.   On: 02/01/2020 01:32   DG Abd 2 Views  Result Date: 01/04/2020 CLINICAL DATA:  Abdomen pain no bowel movement EXAM: ABDOMEN - 2 VIEW COMPARISON:  12/06/2019 FINDINGS: Minimal scarring left base. No free air beneath the diaphragm. Nonobstructed bowel-gas pattern. No radiopaque calculi. IMPRESSION: Nonobstructed bowel-gas pattern. Electronically Signed   By: Donavan Foil M.D.   On: 01/04/2020 18:34   MR ABDOMEN MRCP W WO CONTAST  Result Date: 01/17/2020 CLINICAL DATA:  Jaundice, history of pancreatic cancer EXAM: MRI ABDOMEN WITHOUT AND WITH CONTRAST (INCLUDING MRCP) TECHNIQUE: Multiplanar multisequence MR imaging of the abdomen was performed both before and after the administration of intravenous contrast. Heavily T2-weighted images of the biliary and pancreatic ducts were obtained, and three-dimensional MRCP images were rendered by post processing. CONTRAST:  23m GADAVIST GADOBUTROL 1 MMOL/ML IV SOLN COMPARISON:  Multiple exams, including 11/24/2019 FINDINGS: Please note that images for today's exam span 2 different accession numbers, with post-contrast images in 1 record and precontrast images in the other record. Lower chest: Trace left pleural effusion with adjacent  atelectasis in the left lower lobe. Trace right pleural effusion. Borderline cardiomegaly. A right lower axillary lymph node measures 0.8 cm in short axis on image 3 of series 3. Hepatobiliary: Extensive metastatic burden throughout all lobes of the liver, substantially increased from 11/24/2019 and fairly similar to 01/11/2020. Sludge in the gallbladder with gallbladder wall thickening. Minimal intrahepatic biliary dilatation with the common hepatic duct at 0.9 cm in diameter, and narrowing of the common bile duct down to about 0.3 cm in diameter in the vicinity of the pancreatic mass due to extrinsic compression. Expected tapering course of the distal most CBD on image 12 of series 5. Pancreas: Prominently dilated dorsal pancreatic duct dilated side ducts, extending to the level of the mass in the junction of the pancreatic head and body. The mass measures 3.6 by 2.7 cm on image 64 series 7 and abuts the gastric wall, portal vein, and common bile duct along with obstructing the pancreatic duct. By my measurements, this primary mass was previously 3.5 by 2.5 cm on 11/24/2019. Spleen:  Unremarkable Adrenals/Urinary Tract:  Unremarkable Stomach/Bowel: Pancreatic mass abuts the posterior wall of the stomach. Vascular/Lymphatic: Small peripancreatic lymph nodes without overt pathologic adenopathy. Other:  No supplemental non-categorized findings. Musculoskeletal: Degenerative disc disease at L5-S1. IMPRESSION: 1. Prominently dilated dorsal pancreatic duct extending to the level of the mass in the junction of the pancreatic head and body. The mass abuts the gastric wall, portal vein, and common bile duct, leading to mild narrowing of the common bile duct down to about 0.3 cm, with the common hepatic duct mildly prominent 0.9 cm. The mass has mildly enlarged compared to the CT examination of 11/24/2019. 2. Extensive metastatic burden throughout all lobes of the liver, substantially increased from 11/24/2019 and fairly  similar to 01/11/2020. 3.  Trace left pleural effusion with adjacent atelectasis in the left lower lobe. Borderline cardiomegaly. 4. Sludge in the gallbladder with gallbladder wall thickening. Electronically Signed   By: Van Clines M.D.   On: 01/17/2020 08:29   VAS [place] LOWER EXTREMITY VENOUS (DVT)  Result Date: 01/07/2020  Lower Venous DVT Study Indications: Swelling.  Risk Factors: Cancer. Limitations: Poor ultrasound/tissue interface. Comparison Study: No prior studies. Performing Technologist: Oliver Hum RVT  Examination Guidelines: A complete evaluation includes B-mode imaging, spectral Doppler, color Doppler, and power Doppler as needed of all accessible portions of each vessel. Bilateral testing is considered an integral part of a complete examination. Limited examinations for reoccurring indications may be performed as noted. The reflux portion of the exam is performed with the patient in reverse Trendelenburg.  +-----+---------------+---------+-----------+----------+--------------+ RIGHTCompressibilityPhasicitySpontaneityPropertiesThrombus Aging +-----+---------------+---------+-----------+----------+--------------+ CFV  Full           Yes      Yes                                 +-----+---------------+---------+-----------+----------+--------------+   +---------+---------------+---------+-----------+----------+--------------+ LEFT     CompressibilityPhasicitySpontaneityPropertiesThrombus Aging +---------+---------------+---------+-----------+----------+--------------+ CFV      Full           Yes      Yes                                 +---------+---------------+---------+-----------+----------+--------------+ SFJ      Full                                                        +---------+---------------+---------+-----------+----------+--------------+ FV Prox  Full                                                         +---------+---------------+---------+-----------+----------+--------------+ FV Mid   Full                                                        +---------+---------------+---------+-----------+----------+--------------+ FV DistalFull                                                        +---------+---------------+---------+-----------+----------+--------------+ PFV      Full                                                        +---------+---------------+---------+-----------+----------+--------------+ POP      Full           Yes      Yes                                 +---------+---------------+---------+-----------+----------+--------------+  PTV      Full                                                        +---------+---------------+---------+-----------+----------+--------------+ PERO     Partial                                      Acute          +---------+---------------+---------+-----------+----------+--------------+ Gastroc  None                                         Acute          +---------+---------------+---------+-----------+----------+--------------+     Summary: RIGHT: - No evidence of common femoral vein obstruction.  LEFT: - Findings consistent with acute deep vein thrombosis involving the left peroneal veins, and left gastrocnemius veins. - No cystic structure found in the popliteal fossa.  *See table(s) above for measurements and observations. Electronically signed by Ruta Hinds MD on 01/07/2020 at 7:41:10 PM.    Final     ASSESSMENT AND PLAN: 1. Metastatic pancreatic cancer 2. Sepsis - ?pyelonephritis 3. COVID-19 4. Abdominal pain, nausea, vomiting secondary to #1 5. 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 Anemia 7. Mild thrombocytopenia 8. Transaminitis and hyperbilirubinemia  -The patient is now admitted with sepsis.  Source not entirely clear but CT scan suggest possible pyelonephritis. However, UA not indicative of urinary source. Blood cultures are pending. Agree with covering with broad-spectrum antibiotics. The patient is not experiencing any respiratory symptoms related to Covid. Hospitalist monitoring closely. -The patient is currently on single agent gemcitabine which has been dose reduced due to declining performance status. She has had a very limited response to chemotherapy and also has had difficulty tolerating chemo. Ischial discussions were had in our office earlier this week about adding low-dose Abraxane in the future versus stopping chemotherapy. Further discussion later today per Dr. Burr Medico guarding goals of care. -Continue antiemetics and pain medication. I would also continue dexamethasone 4 mg twice daily for another 1 to 2 days. We were giving this for delayed nausea and vomiting related to chemotherapy. -Continue Lovenox. -Recommend PRBC transfusion for hemoglobin less than 8.    LOS: 0 days   Mikey Bussing, DNP, AGPCNP-BC, AOCNP 02/01/20  Addendum  I have spoke to pt over the phone. I agree with the assessment and and plan and have edited the notes.   Pt presented with severe abdominal pain, was admitted for sepsis, which may not be related to [place]. I have reviewed her lab and chart. I appreciate the excellent care from the hospitalist team, and agree with broad antibiotics and supportive care. Her overall prognosis is poor, I do not feel she can tolerated more chemo (multiple admission after chemo despite chemo dose reduction) and she has had poor response to chemo so far.I planned to discuss goal of care and code status with pt and her husband in my clinic next week, may need to wait after she recovers better to better address with them. I will f/u next week.  Truitt Merle  02/01/2020

## 2020-02-01 NOTE — Progress Notes (Signed)
Following for Code Sepsis  

## 2020-02-01 NOTE — ED Notes (Signed)
Date and time results received: 02/01/20 3:02 AM (use smartphrase ".now" to insert current time)  Test: Covid 19 Critical Value: Positive  Name of Provider Notified: Sedonia Small

## 2020-02-01 NOTE — Progress Notes (Signed)
PT verbally confirmed she understands usage and importance of Flutter device but too weak at this time to perform.

## 2020-02-02 DIAGNOSIS — U071 COVID-19: Secondary | ICD-10-CM

## 2020-02-02 LAB — COMPREHENSIVE METABOLIC PANEL
ALT: 58 U/L — ABNORMAL HIGH (ref 0–44)
AST: 106 U/L — ABNORMAL HIGH (ref 15–41)
Albumin: 2.1 g/dL — ABNORMAL LOW (ref 3.5–5.0)
Alkaline Phosphatase: 209 U/L — ABNORMAL HIGH (ref 38–126)
Anion gap: 10 (ref 5–15)
BUN: 17 mg/dL (ref 6–20)
CO2: 21 mmol/L — ABNORMAL LOW (ref 22–32)
Calcium: 8.5 mg/dL — ABNORMAL LOW (ref 8.9–10.3)
Chloride: 106 mmol/L (ref 98–111)
Creatinine, Ser: 0.68 mg/dL (ref 0.44–1.00)
GFR, Estimated: >60 mL/min (ref >60)
Glucose, Bld: 89 mg/dL (ref 70–99)
Potassium: 4.1 mmol/L (ref 3.5–5.1)
Sodium: 137 mmol/L (ref 135–145)
Total Bilirubin: 1.6 mg/dL — ABNORMAL HIGH (ref 0.3–1.2)
Total Protein: 7.3 g/dL (ref 6.5–8.1)

## 2020-02-02 LAB — PROTIME-INR
INR: 1.3 — ABNORMAL HIGH (ref 0.8–1.2)
Prothrombin Time: 15.9 seconds — ABNORMAL HIGH (ref 11.4–15.2)

## 2020-02-02 LAB — CORTISOL-AM, BLOOD: Cortisol - AM: 2.1 ug/dL — ABNORMAL LOW (ref 6.7–22.6)

## 2020-02-02 LAB — MAGNESIUM: Magnesium: 1.9 mg/dL (ref 1.7–2.4)

## 2020-02-02 LAB — CBC WITH DIFFERENTIAL/PLATELET
Abs Immature Granulocytes: 0.09 10*3/uL — ABNORMAL HIGH (ref 0.00–0.07)
Basophils Absolute: 0 10*3/uL (ref 0.0–0.1)
Basophils Relative: 0 %
Eosinophils Absolute: 0 10*3/uL (ref 0.0–0.5)
Eosinophils Relative: 0 %
HCT: 31.3 % — ABNORMAL LOW (ref 36.0–46.0)
Hemoglobin: 9.8 g/dL — ABNORMAL LOW (ref 12.0–15.0)
Immature Granulocytes: 1 %
Lymphocytes Relative: 14 %
Lymphs Abs: 1.2 10*3/uL (ref 0.7–4.0)
MCH: 30.1 pg (ref 26.0–34.0)
MCHC: 31.3 g/dL (ref 30.0–36.0)
MCV: 96 fL (ref 80.0–100.0)
Monocytes Absolute: 0.2 10*3/uL (ref 0.1–1.0)
Monocytes Relative: 3 %
Neutro Abs: 7.4 10*3/uL (ref 1.7–7.7)
Neutrophils Relative %: 82 %
Platelets: 109 10*3/uL — ABNORMAL LOW (ref 150–400)
RBC: 3.26 MIL/uL — ABNORMAL LOW (ref 3.87–5.11)
RDW: 16.9 % — ABNORMAL HIGH (ref 11.5–15.5)
WBC: 8.9 10*3/uL (ref 4.0–10.5)
nRBC: 0 % (ref 0.0–0.2)

## 2020-02-02 LAB — URINE CULTURE: Culture: NO GROWTH

## 2020-02-02 LAB — C-REACTIVE PROTEIN: CRP: 18.8 mg/dL — ABNORMAL HIGH (ref <1.0)

## 2020-02-02 LAB — PHOSPHORUS: Phosphorus: 3.4 mg/dL (ref 2.5–4.6)

## 2020-02-02 LAB — FERRITIN: Ferritin: 6234 ng/mL — ABNORMAL HIGH (ref 11–307)

## 2020-02-02 LAB — D-DIMER, QUANTITATIVE: D-Dimer, Quant: 12.8 ug/mL-FEU — ABNORMAL HIGH (ref 0.00–0.50)

## 2020-02-02 MED ORDER — SODIUM CHLORIDE 0.9 % IV SOLN
100.0000 mg | Freq: Every day | INTRAVENOUS | Status: DC
  Administered 2020-02-03 – 2020-02-04 (×2): 100 mg via INTRAVENOUS
  Filled 2020-02-02 (×2): qty 20

## 2020-02-02 MED ORDER — SODIUM CHLORIDE 0.9 % IV SOLN
200.0000 mg | Freq: Once | INTRAVENOUS | Status: AC
  Administered 2020-02-02: 200 mg via INTRAVENOUS
  Filled 2020-02-02: qty 200

## 2020-02-02 NOTE — Progress Notes (Signed)
PROGRESS NOTE  Cynthia Frye  TKW:[id number] DOB: 05-28-1960 DOA: 02/01/2020 PCP: Josetta Huddle, MD  Outpatient Specialists: Oncology, Dr. Burr Medico Brief Narrative: Cynthia Frye is a 60 y.o. female with a history of metastatic pancreatic adenocarcinoma diagnosed August 2021 poorly tolerating chemotherapy most recently on dose-reduced 2nd line gemcitabine 01/29/2020 who presented to the ED 1/7 with abdominal pain, nausea and vomiting. She was febrile to 101.74F, tachypneic to 30/min, with sinus tachycardia in 140's without hypotension. WBC elevated to 11.3 (in setting of taking steroids), nevertheless suggestive of sepsis with unclear origin. Imaging revealing DVT of left CFV and known pancreatic cancer with extensive liver mets, peritoneal thickening and small volume ascites. SARS-CoV-2 PCR screen was positive, though she has no respiratory symptoms, hypoxia, nor pulmonary infiltrates.   Assessment & Plan: Active Problems:   COVID-19 virus infection   Sepsis ([place])  Sepsis with unclear origin: CT evidence of pyelonephritis was reported, though only a band of hypoenhancement is seen in the left (contralateral to pain) kidney and no pyuria or urinary complaints. No evidence of cholecystitis or pancreatic inflammation. No hematuria. No pulmonary infection present. No SSTI on exam. No meningismus. With small volume ascites, SBP was considered though exam is not consistent with peritonitis.  - Continue empiric broad coverage in immunocompromised patient pending culture data.   Covid-19 infection: s/p Pfizer July, Aug 2021. No specific attributable symptoms. Doubt this is related to picture of sepsis. - Since she's at high risk of progressing with pneumonia and presumably in very early window of illness, will give remdesivir for now. Current LFTs are not a contraindication. - Hold steroids - Continue isolation, currently planned 10 days  Acute left CFV DVT, chronic right-sided pulmonary emboli:  -  Lovenox 1mg /kg q12h  Nausea, vomiting, GERD:  - IV antiemetics - Decadron has been given to help with chemo-related side effects. Will stop for now with poorer outcomes w/steroid administration early in covid course and with concomitant sepsis. - PPI  Metastatic pancreatic cancer, elevated LFTs: Progressive despite chemotherapy which is also poorly tolerated. - Dr. Burr Medico to follow up with patient 1/10. [place] discussions ongoing regarding decision Re: discontinue chemotherapy vs. continue/add abraxane.   Anemia of malignancy:  - Transfuse for hgb <8g/dl per heme/onc.   SLE: No flare.   HTN:  - Holding home medications   DVT prophylaxis: Lovenox 1mg /kg q12h Code Status: Full Family Communication: None at bedside Disposition Plan:  Status is: Inpatient  Remains inpatient appropriate because:Inpatient level of care appropriate due to severity of illness  Dispo: The patient is from: Home              Anticipated d/c is to: Home              Anticipated d/c date is: 3 days              Patient currently is not medically stable to d/c.  Consultants:   Oncology  Procedures:   None  Antimicrobials:  Vancomycin, cefepime, flagyl   Subjective: Right abdominal pain is moderate-severe, improved currently with dilaudid. No vomiting this morning. No dyspnea or chest pain.   Objective: Vitals:   02/02/20 0500 02/02/20 0530 02/02/20 0600 02/02/20 0941  BP: 120/78 121/82 133/87 (!) 141/91  Pulse: 82 84 86 94  Resp: 12 12 15  (!) 9  Temp:      TempSrc:      SpO2: 99% 99% 100% 100%  Weight:      Height:  Intake/Output Summary (Last 24 hours) at 02/02/2020 0948 Last data filed at 02/02/2020 0100 Gross per 24 hour  Intake 909.78 ml  Output --  Net 909.78 ml   Filed Weights   02/01/20 0020  Weight: 72.6 kg    Gen: 60 y.o. female in no distress Pulm: Non-labored breathing room air with surgical mask on, 100%. Clear to auscultation bilaterally.  CV: Regular rate and  rhythm. No murmur, rub, or gallop. No JVD, no pitting pedal edema. GI: Abdomen soft, non-tender, non-distended, with normoactive bowel sounds. No organomegaly or masses felt. Ext: Warm, no deformities Skin: No rashes, lesions or ulcers. Port site c/d/i, nontender without erythema. Neuro: Alert and oriented. No focal neurological deficits. Psych: Judgement and insight appear normal. Mood & affect appropriate.   Data Reviewed: I have personally reviewed following labs and imaging studies  CBC: Recent Labs  Lab 01/29/20 1056 02/01/20 0108 02/02/20 0703  WBC 13.5* 11.3* 8.9  NEUTROABS 10.7* 9.9* 7.4  HGB 10.8* 9.4* 9.8*  HCT 33.7* 30.4* 31.3*  MCV 91.1 95.9 96.0  PLT 261 139* 0000000*   Basic Metabolic Panel: Recent Labs  Lab 01/29/20 1056 02/01/20 0108 02/02/20 0703  NA 141 137 137  K 3.6 4.0 4.1  CL 99 103 106  CO2 34* 25 21*  GLUCOSE 119* 165* 89  BUN 21* 19 17  CREATININE 0.71 0.64 0.68  CALCIUM 9.0 8.6* 8.5*  MG  --   --  1.9  PHOS  --   --  3.4   GFR: Estimated Creatinine Clearance: 74.5 mL/min (by C-G formula based on SCr of 0.68 mg/dL). Liver Function Tests: Recent Labs  Lab 01/29/20 1056 02/01/20 0108 02/02/20 0703  AST 96* 119* 106*  ALT 69* 63* 58*  ALKPHOS 378* 226* 209*  BILITOT 1.7* 2.5* 1.6*  PROT 8.6* 7.5 7.3  ALBUMIN 2.5* 2.4* 2.1*   Recent Labs  Lab 02/01/20 0108  LIPASE 20   No results for input(s): AMMONIA in the last 168 hours. Coagulation Profile: Recent Labs  Lab 02/01/20 0132  INR 1.4*   Cardiac Enzymes: No results for input(s): CKTOTAL, CKMB, CKMBINDEX, TROPONINI in the last 168 hours. BNP (last 3 results) No results for input(s): PROBNP in the last 8760 hours. HbA1C: No results for input(s): HGBA1C in the last 72 hours. CBG: No results for input(s): GLUCAP in the last 168 hours. Lipid Profile: No results for input(s): CHOL, HDL, LDLCALC, TRIG, CHOLHDL, LDLDIRECT in the last 72 hours. Thyroid Function Tests: No results for  input(s): TSH, T4TOTAL, FREET4, T3FREE, THYROIDAB in the last 72 hours. Anemia Panel: Recent Labs    02/02/20 0703  FERRITIN 6,234*   Urine analysis:    Component Value Date/Time   COLORURINE AMBER (A) 02/01/2020 0200   APPEARANCEUR CLEAR 02/01/2020 0200   LABSPEC 1.016 02/01/2020 0200   PHURINE 9.0 (H) 02/01/2020 0200   GLUCOSEU NEGATIVE 02/01/2020 0200   HGBUR NEGATIVE 02/01/2020 0200   BILIRUBINUR NEGATIVE 02/01/2020 0200   KETONESUR NEGATIVE 02/01/2020 0200   PROTEINUR NEGATIVE 02/01/2020 0200   NITRITE NEGATIVE 02/01/2020 0200   LEUKOCYTESUR NEGATIVE 02/01/2020 0200   Recent Results (from the past 240 hour(s))  Resp Panel by RT-PCR (Flu A&B, Covid) Nasopharyngeal Swab     Status: Abnormal   Collection Time: 02/01/20  1:32 AM   Specimen: Nasopharyngeal Swab; Nasopharyngeal(NP) swabs in vial transport medium  Result Value Ref Range Status   SARS Coronavirus 2 by RT PCR POSITIVE (A) NEGATIVE Final    Comment: RESULT CALLED TO,  READ BACK BY AND VERIFIED WITH: HOLLY, RN @ 0301 ON 02/01/20 C VARNER (NOTE) SARS-CoV-2 target nucleic acids are DETECTED.  The SARS-CoV-2 RNA is generally detectable in upper respiratory specimens during the acute phase of infection. Positive results are indicative of the presence of the identified virus, but do not rule out bacterial infection or co-infection with other pathogens not detected by the test. Clinical correlation with patient history and other diagnostic information is necessary to determine patient infection status. The expected result is Negative.  Fact Sheet for Patients: EntrepreneurPulse.com.au  Fact Sheet for Healthcare Providers: IncredibleEmployment.be  This test is not yet approved or cleared by the [place] FDA and  has been authorized for detection and/or diagnosis of SARS-CoV-2 by FDA under an Emergency Use Authorization (EUA).  This EUA will remain in effect (meaning this test  can  be used) for the duration of  the COVID-19 declaration under Section 564(b)(1) of the Act, 21 U.S.C. section 360bbb-3(b)(1), unless the authorization is terminated or revoked sooner.     Influenza A by PCR NEGATIVE NEGATIVE Final   Influenza B by PCR NEGATIVE NEGATIVE Final    Comment: (NOTE) The Xpert Xpress SARS-CoV-2/FLU/RSV plus assay is intended as an aid in the diagnosis of influenza from Nasopharyngeal swab specimens and should not be used as a sole basis for treatment. Nasal washings and aspirates are unacceptable for Xpert Xpress SARS-CoV-2/FLU/RSV testing.  Fact Sheet for Patients: EntrepreneurPulse.com.au  Fact Sheet for Healthcare Providers: IncredibleEmployment.be  This test is not yet approved or cleared by the [place] FDA and has been authorized for detection and/or diagnosis of SARS-CoV-2 by FDA under an Emergency Use Authorization (EUA). This EUA will remain in effect (meaning this test can be used) for the duration of the COVID-19 declaration under Section 564(b)(1) of the Act, 21 U.S.C. section 360bbb-3(b)(1), unless the authorization is terminated or revoked.  Performed at [facility], [place] [address] [place], [place] [address]   Urine culture     Status: None   Collection Time: 02/01/20  2:00 AM   Specimen: In/Out Cath Urine  Result Value Ref Range Status   Specimen Description   Final    IN/OUT CATH URINE Performed at [facility] [address], [place], [place] [address]    Special Requests   Final    NONE Performed at [facility], [place] [address], [place], [place] [address]    Culture   Final    NO GROWTH Performed at [facility], [place] [address], [place], [place] [address]    Report Status 02/02/2020 FINAL  Final  MRSA PCR Screening     Status: None   Collection Time: 02/01/20  9:27 AM   Specimen: Nasopharyngeal  Result Value Ref  Range Status   MRSA by PCR NEGATIVE NEGATIVE Final    Comment:        The GeneXpert MRSA Assay (FDA approved for NASAL specimens only), is one component of a comprehensive MRSA colonization surveillance program. It is not intended to diagnose MRSA infection nor to guide or monitor treatment for MRSA infections. Performed at [facility], [place] [address] [place],  [address]       Radiology Studies: CT ANGIO CHEST PE W OR WO CONTRAST  Result Date: 02/01/2020 CLINICAL DATA:  Increasing right rib pain for 1 day. Nausea and vomiting EXAM: CT ANGIOGRAPHY CHEST WITH CONTRAST TECHNIQUE: Multidetector CT imaging of the chest was performed using the standard  protocol during bolus administration of intravenous contrast. Multiplanar CT image reconstructions and MIPs were obtained to evaluate the vascular anatomy. CONTRAST:  128mL OMNIPAQUE IOHEXOL 350 MG/ML SOLN COMPARISON:  01/11/2020 FINDINGS: Cardiovascular: Normal heart size. No pericardial effusion. No acute aortic finding. Extensive motion and streak artifact. Pulmonary emboli are again seen at the bifurcation of the right main pulmonary artery, peripheral within the vessel and nonacute. More peripheral pulmonary arteries are not reliably characterized due to the degree of artifact. No definite progression from 01/11/2020. Mediastinum/Nodes: Mild enlargement of bilateral axillary lymph nodes which may be related to history of connective tissue disease. No mediastinal adenopathy. Lungs/Pleura: Reportedly patient is COVID positive per the chart. There is no edema, consolidation, effusion, or pneumothorax. Small opacity at the lingula which has a scar-like appearance, also seen on prior. No suspicious pulmonary nodules. Upper Abdomen: Reported separately Musculoskeletal: No acute finding. Review of the MIP images confirms the above findings. IMPRESSION: 1. Very limited CTA due to the extent of motion and streak artifact. Persisting  nonacute right-sided pulmonary emboli as seen on 01/11/2020 chest CTA. No clear progression of emboli. DVT is present at the left common femoral vein on prior abdominal CT. 2. COVID positivity without pneumonia. Electronically Signed   By: Monte Fantasia M.D.   On: 02/01/2020 04:53   CT ABDOMEN PELVIS W CONTRAST  Result Date: 02/01/2020 CLINICAL DATA:  Acute right flank pain.  Pancreatic cancer EXAM: CT ABDOMEN AND PELVIS WITH CONTRAST TECHNIQUE: Multidetector CT imaging of the abdomen and pelvis was performed using the standard protocol following bolus administration of intravenous contrast. CONTRAST:  172mL OMNIPAQUE IOHEXOL 350 MG/ML SOLN COMPARISON:  01/24/2020 FINDINGS: Lower chest:  Reported separately Hepatobiliary: Innumerable metastases throughout the liver. Gallbladder sludge or calculi is likely. No evidence of acute cholecystitis. Pancreas: Mass at the pancreatic head with marked proximal ductal dilatation and atrophy. The mass measures approximately 3 cm. No acute superimposed inflammation. Spleen: Unremarkable. Adrenals/Urinary Tract: Negative adrenals. There is a band of low-density in the posterior and lower left kidney. No hydronephrosis. Unremarkable bladder. Stomach/Bowel: Extensive submucosal low-density within small bowel proximal colon which appears fairly well-defined and has the appearance of fat deposition, also seen on prior but more prominent today. No bowel obstruction or clear bowel edema. Gastric outlet is distorted by the pancreatic mass, but no gastric dilatation. The appendix is not well visualized. No pericecal inflammation. Vascular/Lymphatic: DVT in the left common femoral vein which is resolved by the level of the common femoral bifurcation. This finding will be called in conjunction with chest CT findings. Prominent lymph nodes in the deep liver drainage about the pancreatic head, similar and likely metastatic. Reproductive:Hysterectomy. Other: Small volume ascites in the  pelvis. Minimal interloop fluid is also seen. No discrete peritoneal nodularity but there is likely mild thickening of the peritoneum the upper right pericolic gutter. Musculoskeletal: No acute abnormalities. IMPRESSION: 1. DVT in the left common femoral vein, nonobstructive. 2. Band of hypoenhancement in the lower left kidney, contralateral to site of symptoms. Pyelonephritis is considered, please correlate with urinalysis. 3. Known pancreas cancer with extensive liver metastases. 4. Small volume ascites with possible peritoneal thickening, concerning for early peritoneal metastatic disease. 5. Gallbladder sludge or calculi. No evidence of acute cholecystitis. Electronically Signed   By: Monte Fantasia M.D.   On: 02/01/2020 04:47   DG Chest Port 1 View  Result Date: 02/01/2020 CLINICAL DATA:  Sepsis, right anterior chest pain, history of pancreatic cancer EXAM: PORTABLE CHEST 1 VIEW COMPARISON:  11/24/2019 FINDINGS:  Single frontal view of the chest demonstrates stable left chest wall port. Cardiac silhouette is unremarkable. Lung volumes are diminished, with no acute airspace disease, effusion, or pneumothorax. No acute bony abnormalities. IMPRESSION: 1. Low lung volumes.  No acute process. Electronically Signed   By: Randa Ngo M.D.   On: 02/01/2020 01:32    Scheduled Meds: . vitamin C  500 mg Oral Daily  . enoxaparin  80 mg Subcutaneous Q12H  . lipase/protease/amylase  24,000 Units Oral TID WC  . metroNIDAZOLE  500 mg Oral Q8H  . zinc sulfate  220 mg Oral Daily   Continuous Infusions: . sodium chloride 100 mL/hr at 02/02/20 0110  . ceFEPime (MAXIPIME) IV Stopped (02/02/20 0230)  . vancomycin Stopped (02/01/20 2322)     LOS: 1 day   Time spent: 35 minutes.  Patrecia Pour, MD [facility] [email or web address] 02/02/2020, 9:48 AM

## 2020-02-03 LAB — CBC WITH DIFFERENTIAL/PLATELET
Abs Immature Granulocytes: 0 10*3/uL (ref 0.00–0.07)
Band Neutrophils: 0 %
Basophils Absolute: 0 10*3/uL (ref 0.0–0.1)
Basophils Relative: 0 %
Blasts: 0 %
Eosinophils Absolute: 0.1 10*3/uL (ref 0.0–0.5)
Eosinophils Relative: 3 %
HCT: 26 % — ABNORMAL LOW (ref 36.0–46.0)
Hemoglobin: 8.5 g/dL — ABNORMAL LOW (ref 12.0–15.0)
Lymphocytes Relative: 22 %
Lymphs Abs: 0.8 10*3/uL (ref 0.7–4.0)
MCH: 30.1 pg (ref 26.0–34.0)
MCHC: 32.7 g/dL (ref 30.0–36.0)
MCV: 92.2 fL (ref 80.0–100.0)
Metamyelocytes Relative: 0 %
Monocytes Absolute: 0.4 10*3/uL (ref 0.1–1.0)
Monocytes Relative: 12 %
Myelocytes: 0 %
Neutro Abs: 2.4 10*3/uL (ref 1.7–7.7)
Neutrophils Relative %: 63 %
Other: 0 %
Platelets: 85 10*3/uL — ABNORMAL LOW (ref 150–400)
Promyelocytes Relative: 0 %
RBC: 2.82 MIL/uL — ABNORMAL LOW (ref 3.87–5.11)
RDW: 16.8 % — ABNORMAL HIGH (ref 11.5–15.5)
WBC: 3.7 10*3/uL — ABNORMAL LOW (ref 4.0–10.5)
nRBC: 0 /100 WBC
nRBC: 0.8 % — ABNORMAL HIGH (ref 0.0–0.2)

## 2020-02-03 LAB — COMPREHENSIVE METABOLIC PANEL
ALT: 54 U/L — ABNORMAL HIGH (ref 0–44)
AST: 85 U/L — ABNORMAL HIGH (ref 15–41)
Albumin: 1.8 g/dL — ABNORMAL LOW (ref 3.5–5.0)
Alkaline Phosphatase: 232 U/L — ABNORMAL HIGH (ref 38–126)
Anion gap: 8 (ref 5–15)
BUN: 13 mg/dL (ref 6–20)
CO2: 22 mmol/L (ref 22–32)
Calcium: 8.5 mg/dL — ABNORMAL LOW (ref 8.9–10.3)
Chloride: 107 mmol/L (ref 98–111)
Creatinine, Ser: 0.8 mg/dL (ref 0.44–1.00)
GFR, Estimated: >60 mL/min (ref >60)
Glucose, Bld: 75 mg/dL (ref 70–99)
Potassium: 3.4 mmol/L — ABNORMAL LOW (ref 3.5–5.1)
Sodium: 137 mmol/L (ref 135–145)
Total Bilirubin: 1.4 mg/dL — ABNORMAL HIGH (ref 0.3–1.2)
Total Protein: 6.2 g/dL — ABNORMAL LOW (ref 6.5–8.1)

## 2020-02-03 LAB — C-REACTIVE PROTEIN: CRP: 10.7 mg/dL — ABNORMAL HIGH (ref <1.0)

## 2020-02-03 MED ORDER — POTASSIUM CHLORIDE CRYS ER 20 MEQ PO TBCR
40.0000 meq | EXTENDED_RELEASE_TABLET | Freq: Once | ORAL | Status: AC
  Administered 2020-02-03: 40 meq via ORAL
  Filled 2020-02-03: qty 2

## 2020-02-03 NOTE — ED Notes (Signed)
Patient requesting lorazepam for nausea

## 2020-02-03 NOTE — ED Notes (Signed)
Patient requesting apple sauce. Asked MD for regular diet. Order placed.

## 2020-02-03 NOTE — Progress Notes (Addendum)
PROGRESS NOTE  Cynthia Frye  QAS:[id number] DOB: 1960/06/07 DOA: 02/01/2020 PCP: Josetta Huddle, MD  Outpatient Specialists: Oncology, Dr. Burr Medico Brief Narrative: Cynthia Frye is a 60 y.o. female with a history of metastatic pancreatic adenocarcinoma diagnosed August 2021 poorly tolerating chemotherapy most recently on dose-reduced 2nd line gemcitabine 01/29/2020 who presented to the ED 1/7 with abdominal pain, nausea and vomiting. She was febrile to 101.90F, tachypneic to 30/min, with sinus tachycardia in 140's without hypotension. WBC elevated to 11.3 (in setting of taking steroids), nevertheless suggestive of sepsis with unclear origin. Imaging revealing DVT of left CFV and known pancreatic cancer with extensive liver mets, peritoneal thickening and small volume ascites. SARS-CoV-2 PCR screen was positive, though she has no respiratory symptoms, hypoxia, nor pulmonary infiltrates.   Assessment & Plan: Active Problems:   COVID-19 virus infection   Sepsis ([place])  Sepsis with unclear origin: CT evidence of pyelonephritis was reported, though only a band of hypoenhancement is seen in the left (contralateral to pain) kidney and no pyuria or urinary complaints. No evidence of cholecystitis or pancreatic inflammation. No hematuria. No pulmonary infection present. No SSTI on exam. No meningismus. With small volume ascites, SBP was considered though exam is not consistent with peritonitis.  - Continue empiric broad coverage in immunocompromised patient pending culture data. MRSA PCR negative, no evidence of purulent cellulitis, blood cultures NGTD, will DC vancomycin.   Covid-19 infection: s/p Pfizer July, Aug 2021. No specific attributable symptoms. Doubt this is related to picture of sepsis. - Not candidate for mAb, so giving remdesivir (started 1/8) - No hypoxia. Hold steroids - Continue isolation, currently planned 10 days  Acute left CFV DVT, chronic right-sided pulmonary emboli:  - Lovenox 1mg /kg  q12h  Nausea, vomiting, GERD, protein calorie malnutrition:  - IV antiemetics - Decadron has been given to help with chemo-related side effects. Stopped for now with poorer outcomes w/steroid administration early in covid course and with concomitant sepsis. - PPI  Metastatic pancreatic cancer, elevated LFTs: Progressive despite chemotherapy which is also poorly tolerated. - Dr. Burr Medico to follow up with patient 1/10. [place] discussions ongoing regarding decision Re: discontinue chemotherapy vs. continue/add abraxane.   Pancytopenia, anemia of malignancy:  - Transfuse for hgb <8g/dl per heme/onc. Will repeat CBC in AM no active bleeding noted.  Thrombocytopenia: Trending downward as are all cell lines with IV fluids. Timinig not necessarily consistent with HIT. No evidence of bleeding currently.  - Will monitor CBC in AM and for clinical evidence of bleeding.   SLE: No flare.   HTN:  - Holding home medications   DVT prophylaxis: Lovenox 1mg /kg q12h Code Status: Full Family Communication: None at bedside. Husband by phone. Disposition Plan:  Status is: Inpatient  Remains inpatient appropriate because:Inpatient level of care appropriate due to severity of illness  Dispo: The patient is from: Home              Anticipated d/c is to: Home              Anticipated d/c date is: 1 day              Patient currently is not medically stable to d/c.  Consultants:   Oncology  Procedures:   None  Antimicrobials:  Vancomycin, cefepime, flagyl   Subjective: Frustrated because she is still in the ED awaiting placement upstairs. Has constant waxing/waning right-sided abdominal pain that is relatively ill-defined, nonradiating. Nausea is also constant, not worse with food, improved with IV medications. No dyspnea,  no more fevers. Wants to start diet, get some apple sauce.   Objective: Vitals:   02/03/20 0907 02/03/20 0915 02/03/20 0945 02/03/20 1100  BP: (!) 143/89  139/90 (!) 143/89   Pulse: (!) 102  (!) 113 97  Resp: (!) 22 10 20 12   Temp:      TempSrc:      SpO2: 100%  100% 100%  Weight:      Height:        Intake/Output Summary (Last 24 hours) at 02/03/2020 1401 Last data filed at 02/03/2020 1026 Gross per 24 hour  Intake 100 ml  Output -  Net 100 ml   Filed Weights   02/01/20 0020  Weight: 72.6 kg   Gen: 60 y.o. female in no distress Pulm: Nonlabored breathing room air with mask on. No crackles or wheezes. CV: Regular tachycardia. No murmur, rub, or gallop. No JVD, no pitting dependent edema. GI: Abdomen soft, diffusely tender, non-distended, with normoactive bowel sounds.  Ext: Warm, no deformities Skin: No rashes, lesions or ulcers on visualized skin. Port site looks wnl. Neuro: Alert and oriented. No focal neurological deficits. Psych: Judgement and insight appear fair. Mood euthymic & affect congruent. Behavior is appropriate.    Data Reviewed: I have personally reviewed following labs and imaging studies  Lab Results  Component Value Date   TSH 2.545 11/05/2019    CBC: Recent Labs  Lab 01/29/20 1056 02/01/20 0108 02/02/20 0703 02/03/20 0500  WBC 13.5* 11.3* 8.9 3.7*  NEUTROABS 10.7* 9.9* 7.4 2.4  HGB 10.8* 9.4* 9.8* 8.5*  HCT 33.7* 30.4* 31.3* 26.0*  MCV 91.1 95.9 96.0 92.2  PLT 261 139* 109* 85*   Basic Metabolic Panel: Recent Labs  Lab 01/29/20 1056 02/01/20 0108 02/02/20 0703 02/03/20 0500  NA 141 137 137 137  K 3.6 4.0 4.1 3.4*  CL 99 103 106 107  CO2 34* 25 21* 22  GLUCOSE 119* 165* 89 75  BUN 21* 19 17 13   CREATININE 0.71 0.64 0.68 0.80  CALCIUM 9.0 8.6* 8.5* 8.5*  MG  --   --  1.9  --   PHOS  --   --  3.4  --    GFR: Estimated Creatinine Clearance: 74.5 mL/min (by C-G formula based on SCr of 0.8 mg/dL). Liver Function Tests: Recent Labs  Lab 01/29/20 1056 02/01/20 0108 02/02/20 0703 02/03/20 0500  AST 96* 119* 106* 85*  ALT 69* 63* 58* 54*  ALKPHOS 378* 226* 209* 232*  BILITOT 1.7* 2.5* 1.6* 1.4*  PROT  8.6* 7.5 7.3 6.2*  ALBUMIN 2.5* 2.4* 2.1* 1.8*   Recent Labs  Lab 02/01/20 0108  LIPASE 20   No results for input(s): AMMONIA in the last 168 hours. Coagulation Profile: Recent Labs  Lab 02/01/20 0132 02/02/20 0703  INR 1.4* 1.3*   Cardiac Enzymes: No results for input(s): CKTOTAL, CKMB, CKMBINDEX, TROPONINI in the last 168 hours. BNP (last 3 results) No results for input(s): PROBNP in the last 8760 hours. HbA1C: No results for input(s): HGBA1C in the last 72 hours. CBG: No results for input(s): GLUCAP in the last 168 hours. Lipid Profile: No results for input(s): CHOL, HDL, LDLCALC, TRIG, CHOLHDL, LDLDIRECT in the last 72 hours. Thyroid Function Tests: No results for input(s): TSH, T4TOTAL, FREET4, T3FREE, THYROIDAB in the last 72 hours. Anemia Panel: Recent Labs    02/02/20 0703  FERRITIN 6,234*   Urine analysis:    Component Value Date/Time   COLORURINE AMBER (A) 02/01/2020 0200   APPEARANCEUR  CLEAR 02/01/2020 0200   LABSPEC 1.016 02/01/2020 0200   PHURINE 9.0 (H) 02/01/2020 0200   GLUCOSEU NEGATIVE 02/01/2020 0200   HGBUR NEGATIVE 02/01/2020 0200   BILIRUBINUR NEGATIVE 02/01/2020 0200   KETONESUR NEGATIVE 02/01/2020 0200   PROTEINUR NEGATIVE 02/01/2020 0200   NITRITE NEGATIVE 02/01/2020 0200   LEUKOCYTESUR NEGATIVE 02/01/2020 0200   Recent Results (from the past 240 hour(s))  Blood Culture (routine x 2)     Status: None (Preliminary result)   Collection Time: 02/01/20  1:08 AM   Specimen: BLOOD  Result Value Ref Range Status   Specimen Description   Final    BLOOD BLOOD RIGHT FOREARM Performed at [facility], [place] [address], [place], [place] [address]    Special Requests   Final    BOTTLES DRAWN AEROBIC AND ANAEROBIC Blood Culture results may not be optimal due to an inadequate volume of blood received in culture bottles Performed at [facility] [address] [place], [place] [address]    Culture   Final     NO GROWTH 2 DAYS Performed at [facility] [address], [place], [place] [address]    Report Status PENDING  Incomplete  Blood Culture (routine x 2)     Status: None (Preliminary result)   Collection Time: 02/01/20  1:32 AM   Specimen: BLOOD  Result Value Ref Range Status   Specimen Description   Final    BLOOD RIGHT ANTECUBITAL Performed at [facility] [place] [address] [place], [place] [address]    Special Requests   Final    BOTTLES DRAWN AEROBIC AND ANAEROBIC Blood Culture adequate volume Performed at [facility] [address], [place], [place] [address]    Culture   Final    NO GROWTH 2 DAYS Performed at [facility] [address] [place], [place] [address]    Report Status PENDING  Incomplete  Resp Panel by RT-PCR (Flu A&B, Covid) Nasopharyngeal Swab     Status: Abnormal   Collection Time: 02/01/20  1:32 AM   Specimen: Nasopharyngeal Swab; Nasopharyngeal(NP) swabs in vial transport medium  Result Value Ref Range Status   SARS Coronavirus 2 by RT PCR POSITIVE (A) NEGATIVE Final    Comment: RESULT CALLED TO, READ BACK BY AND VERIFIED WITH: HOLLY, RN @ 0301 ON 02/01/20 C VARNER (NOTE) SARS-CoV-2 target nucleic acids are DETECTED.  The SARS-CoV-2 RNA is generally detectable in upper respiratory specimens during the acute phase of infection. Positive results are indicative of the presence of the identified virus, but do not rule out bacterial infection or co-infection with other pathogens not detected by the test. Clinical correlation with patient history and other diagnostic information is necessary to determine patient infection status. The expected result is Negative.  Fact Sheet for Patients: EntrepreneurPulse.com.au  Fact Sheet for Healthcare Providers: IncredibleEmployment.be  This test is not yet approved or cleared by the [place] FDA and  has been authorized for  detection and/or diagnosis of SARS-CoV-2 by FDA under an Emergency Use Authorization (EUA).  This EUA will remain in effect (meaning this test can  be used) for the duration of  the COVID-19 declaration under Section 564(b)(1) of the Act, 21 U.S.C. section 360bbb-3(b)(1), unless the authorization is terminated or revoked sooner.     Influenza A by PCR NEGATIVE NEGATIVE Final   Influenza B by PCR NEGATIVE NEGATIVE Final    Comment: (NOTE) The Xpert Xpress SARS-CoV-2/FLU/RSV plus assay  is intended as an aid in the diagnosis of influenza from Nasopharyngeal swab specimens and should not be used as a sole basis for treatment. Nasal washings and aspirates are unacceptable for Xpert Xpress SARS-CoV-2/FLU/RSV testing.  Fact Sheet for Patients: EntrepreneurPulse.com.au  Fact Sheet for Healthcare Providers: IncredibleEmployment.be  This test is not yet approved or cleared by the [place] FDA and has been authorized for detection and/or diagnosis of SARS-CoV-2 by FDA under an Emergency Use Authorization (EUA). This EUA will remain in effect (meaning this test can be used) for the duration of the COVID-19 declaration under Section 564(b)(1) of the Act, 21 U.S.C. section 360bbb-3(b)(1), unless the authorization is terminated or revoked.  Performed at [facility], [place] [address] [place], [place] [address]   Urine culture     Status: None   Collection Time: 02/01/20  2:00 AM   Specimen: In/Out Cath Urine  Result Value Ref Range Status   Specimen Description   Final    IN/OUT CATH URINE Performed at [place] [address] [place], [place] [address]    Special Requests   Final    NONE Performed at [facility], [place] [address], [place], [place] [address]    Culture   Final    NO GROWTH Performed at [facility], [place] [address], [place], [place] [address]    Report Status  02/02/2020 FINAL  Final  MRSA PCR Screening     Status: None   Collection Time: 02/01/20  9:27 AM   Specimen: Nasopharyngeal  Result Value Ref Range Status   MRSA by PCR NEGATIVE NEGATIVE Final    Comment:        The GeneXpert MRSA Assay (FDA approved for NASAL specimens only), is one component of a comprehensive MRSA colonization surveillance program. It is not intended to diagnose MRSA infection nor to guide or monitor treatment for MRSA infections. Performed at [facility], [place] [address], [place], [place] [address]       Radiology Studies: No results found.  Scheduled Meds: . vitamin C  500 mg Oral Daily  . enoxaparin  80 mg Subcutaneous Q12H  . lipase/protease/amylase  24,000 Units Oral TID WC  . metroNIDAZOLE  500 mg Oral Q8H  . zinc sulfate  220 mg Oral Daily   Continuous Infusions: . sodium chloride Stopped (02/02/20 0700)  . ceFEPime (MAXIPIME) IV Stopped (02/03/20 1026)  . remdesivir 100 mg in NS 100 mL 100 mg (02/03/20 1025)     LOS: 2 days   Time spent: 35 minutes.  Patrecia Pour, MD [facility] [email or web address] 02/03/2020, 2:01 PM

## 2020-02-04 ENCOUNTER — Inpatient Hospital Stay: Payer: BC Managed Care – PPO

## 2020-02-04 ENCOUNTER — Inpatient Hospital Stay: Payer: BC Managed Care – PPO | Admitting: Hematology

## 2020-02-04 DIAGNOSIS — A419 Sepsis, unspecified organism: Secondary | ICD-10-CM | POA: Diagnosis not present

## 2020-02-04 LAB — CBC WITH DIFFERENTIAL/PLATELET
Abs Immature Granulocytes: 0.1 10*3/uL — ABNORMAL HIGH (ref 0.00–0.07)
Basophils Absolute: 0.1 10*3/uL (ref 0.0–0.1)
Basophils Relative: 1 %
Eosinophils Absolute: 0 10*3/uL (ref 0.0–0.5)
Eosinophils Relative: 0 %
HCT: 25.7 % — ABNORMAL LOW (ref 36.0–46.0)
Hemoglobin: 8.4 g/dL — ABNORMAL LOW (ref 12.0–15.0)
Immature Granulocytes: 1 %
Lymphocytes Relative: 12 %
Lymphs Abs: 1 10*3/uL (ref 0.7–4.0)
MCH: 29.9 pg (ref 26.0–34.0)
MCHC: 32.7 g/dL (ref 30.0–36.0)
MCV: 91.5 fL (ref 80.0–100.0)
Monocytes Absolute: 0.8 10*3/uL (ref 0.1–1.0)
Monocytes Relative: 10 %
Neutro Abs: 5.8 10*3/uL (ref 1.7–7.7)
Neutrophils Relative %: 76 %
Platelets: 83 10*3/uL — ABNORMAL LOW (ref 150–400)
RBC: 2.81 MIL/uL — ABNORMAL LOW (ref 3.87–5.11)
RDW: 17 % — ABNORMAL HIGH (ref 11.5–15.5)
WBC: 7.7 10*3/uL (ref 4.0–10.5)
nRBC: 0.9 % — ABNORMAL HIGH (ref 0.0–0.2)

## 2020-02-04 LAB — COMPREHENSIVE METABOLIC PANEL
ALT: 128 U/L — ABNORMAL HIGH (ref 0–44)
AST: 242 U/L — ABNORMAL HIGH (ref 15–41)
Albumin: 2.3 g/dL — ABNORMAL LOW (ref 3.5–5.0)
Alkaline Phosphatase: 329 U/L — ABNORMAL HIGH (ref 38–126)
Anion gap: 12 (ref 5–15)
BUN: 7 mg/dL (ref 6–20)
CO2: 20 mmol/L — ABNORMAL LOW (ref 22–32)
Calcium: 8.5 mg/dL — ABNORMAL LOW (ref 8.9–10.3)
Chloride: 104 mmol/L (ref 98–111)
Creatinine, Ser: 0.66 mg/dL (ref 0.44–1.00)
GFR, Estimated: >60 mL/min (ref >60)
Glucose, Bld: 93 mg/dL (ref 70–99)
Potassium: 2.8 mmol/L — ABNORMAL LOW (ref 3.5–5.1)
Sodium: 136 mmol/L (ref 135–145)
Total Bilirubin: 2.1 mg/dL — ABNORMAL HIGH (ref 0.3–1.2)
Total Protein: 7.2 g/dL (ref 6.5–8.1)

## 2020-02-04 LAB — C-REACTIVE PROTEIN: CRP: 14.3 mg/dL — ABNORMAL HIGH (ref <1.0)

## 2020-02-04 LAB — MAGNESIUM: Magnesium: 1.6 mg/dL — ABNORMAL LOW (ref 1.7–2.4)

## 2020-02-04 MED ORDER — MAGNESIUM SULFATE 4 GM/100ML IV SOLN
4.0000 g | Freq: Once | INTRAVENOUS | Status: AC
  Administered 2020-02-04: 4 g via INTRAVENOUS
  Filled 2020-02-04: qty 100

## 2020-02-04 MED ORDER — SODIUM CHLORIDE 0.9% FLUSH: 10.0000 mL | INTRAVENOUS | Status: DC | PRN

## 2020-02-04 MED ORDER — POTASSIUM CHLORIDE 10 MEQ/100ML IV SOLN
10.0000 meq | INTRAVENOUS | Status: AC
  Administered 2020-02-05 (×5): 10 meq via INTRAVENOUS
  Filled 2020-02-04 (×4): qty 100

## 2020-02-04 MED ORDER — ENSURE ENLIVE PO LIQD
237.0000 mL | Freq: Two times a day (BID) | ORAL | Status: DC
  Administered 2020-02-05 – 2020-02-07 (×4): 237 mL via ORAL

## 2020-02-04 MED ORDER — CHLORHEXIDINE GLUCONATE CLOTH 2 % EX PADS
6.0000 | MEDICATED_PAD | Freq: Every day | CUTANEOUS | Status: DC
  Administered 2020-02-04 – 2020-02-07 (×4): 6 via TOPICAL

## 2020-02-04 MED ORDER — POTASSIUM CHLORIDE 10 MEQ/100ML IV SOLN: 10.0000 meq | INTRAVENOUS | Status: DC

## 2020-02-04 NOTE — Progress Notes (Signed)
Spoke with Dr. Ardeen Garland, Radiologist. CXR done 02/01/20 shows port tip in SVC.  Port is sufficient for TPN. Pt will need peripheral site for other medications once nutrition is started.

## 2020-02-04 NOTE — Plan of Care (Signed)

## 2020-02-04 NOTE — Progress Notes (Signed)
Cynthia Frye   DOB:1960-04-24   [id number]   [id number]  Oncology follow up   I connected with Cynthia Frye on 02/04/2020 at 2:50pm EST by telephone visit and verified that I am speaking with the correct person using two identifiers.   I discussed the limitations, risks, security and privacy concerns of performing an evaluation and management service by telephone and the availability of in person appointments. I also discussed with the patient that there may be a patient responsible charge related to this service. The patient expressed understanding and agreed to proceed.   Other persons participating in the visit and their role in the encounter:  husband   Patient's location:  Hospital  Provider's location:  My office   Subjective: Pt was finally moved form ED to [facility] hospital last night. She remains to be afebrile, VS stable. She is able to drink some liquids, has not tried food, and still has intermittent nausea and vomiting. Very fatigued, abdominal pain is controlled.   Objective:  Vitals:   02/04/20 1112 02/04/20 1415  BP: (!) 148/94 (!) 146/96  Pulse: (!) 121 (!) 112  Resp: 20 18  Temp: 99 F (37.2 C) 98 F (36.7 C)  SpO2: 100% 100%    Body mass index is 26.76 kg/m.  Intake/Output Summary (Last 24 hours) at 02/04/2020 1454 Last data filed at 02/03/2020 2100 Gross per 24 hour  Intake 100 ml  Output 1500 ml  Net -1400 ml    Exam not performed   CBG (last 3)  No results for input(s): GLUCAP in the last 72 hours.   Labs:  Urine Studies No results for input(s): UHGB, CRYS in the last 72 hours.  Invalid input(s): UACOL, UAPR, USPG, UPH, UTP, UGL, UKET, UBIL, UNIT, UROB, [place], UEPI, UWBC, [place], [place], [place], [place], [place]  Basic Metabolic Panel: Recent Labs  Lab 01/29/20 1056 02/01/20 0108 02/02/20 0703 02/03/20 0500 02/04/20 1201  NA 141 137 137 137 136  K 3.6 4.0 4.1 3.4* 2.8*  CL 99 103 106 107 104  CO2 34* 25 21* 22 20*  GLUCOSE 119* 165* 89 75 93   BUN 21* 19 17 13 7   CREATININE 0.71 0.64 0.68 0.80 0.66  CALCIUM 9.0 8.6* 8.5* 8.5* 8.5*  MG  --   --  1.9  --   --   PHOS  --   --  3.4  --   --    GFR Estimated Creatinine Clearance: 82.3 mL/min (by C-G formula based on SCr of 0.66 mg/dL). Liver Function Tests: Recent Labs  Lab 01/29/20 1056 02/01/20 0108 02/02/20 0703 02/03/20 0500 02/04/20 1201  AST 96* 119* 106* 85* 242*  ALT 69* 63* 58* 54* 128*  ALKPHOS 378* 226* 209* 232* 329*  BILITOT 1.7* 2.5* 1.6* 1.4* 2.1*  PROT 8.6* 7.5 7.3 6.2* 7.2  ALBUMIN 2.5* 2.4* 2.1* 1.8* 2.3*   Recent Labs  Lab 02/01/20 0108  LIPASE 20   No results for input(s): AMMONIA in the last 168 hours. Coagulation profile Recent Labs  Lab 02/01/20 0132 02/02/20 0703  INR 1.4* 1.3*    CBC: Recent Labs  Lab 01/29/20 1056 02/01/20 0108 02/02/20 0703 02/03/20 0500 02/04/20 1201  WBC 13.5* 11.3* 8.9 3.7* 7.7  NEUTROABS 10.7* 9.9* 7.4 2.4 5.8  HGB 10.8* 9.4* 9.8* 8.5* 8.4*  HCT 33.7* 30.4* 31.3* 26.0* 25.7*  MCV 91.1 95.9 96.0 92.2 91.5  PLT 261 139* 109* 85* 83*   Cardiac Enzymes: No results for input(s): CKTOTAL, CKMB, CKMBINDEX,  TROPONINI in the last 168 hours. BNP: Invalid input(s): POCBNP CBG: No results for input(s): GLUCAP in the last 168 hours. D-Dimer Recent Labs    02/02/20 0703  DDIMER 12.80*   Hgb A1c No results for input(s): HGBA1C in the last 72 hours. Lipid Profile No results for input(s): CHOL, HDL, LDLCALC, TRIG, CHOLHDL, LDLDIRECT in the last 72 hours. Thyroid function studies No results for input(s): TSH, T4TOTAL, T3FREE, THYROIDAB in the last 72 hours.  Invalid input(s): FREET3 Anemia work up [employer]    02/02/20 0703  FERRITIN 6,234*   Microbiology Recent Results (from the past 240 hour(s))  Blood Culture (routine x 2)     Status: None (Preliminary result)   Collection Time: 02/01/20  1:08 AM   Specimen: BLOOD  Result Value Ref Range Status   Specimen Description   Final    BLOOD BLOOD  RIGHT FOREARM Performed at [facility] [address], [place], [place] [address]    Special Requests   Final    BOTTLES DRAWN AEROBIC AND ANAEROBIC Blood Culture results may not be optimal due to an inadequate volume of blood received in culture bottles Performed at [place] [facility] [address] [place], [place] [address]    Culture   Final    NO GROWTH 3 DAYS Performed at [facility], [place] [address], [place], [place] [address]    Report Status PENDING  Incomplete  Blood Culture (routine x 2)     Status: None (Preliminary result)   Collection Time: 02/01/20  1:32 AM   Specimen: BLOOD  Result Value Ref Range Status   Specimen Description   Final    BLOOD RIGHT ANTECUBITAL Performed at [facility] [address] [place], [place] [address]    Special Requests   Final    BOTTLES DRAWN AEROBIC AND ANAEROBIC Blood Culture adequate volume Performed at [facility] [address] [place], [place] [address]    Culture   Final    NO GROWTH 3 DAYS Performed at [facility], [place] [address], [place], [place] [address]    Report Status PENDING  Incomplete  Resp Panel by RT-PCR (Flu A&B, Covid) Nasopharyngeal Swab     Status: Abnormal   Collection Time: 02/01/20  1:32 AM   Specimen: Nasopharyngeal Swab; Nasopharyngeal(NP) swabs in vial transport medium  Result Value Ref Range Status   SARS Coronavirus 2 by RT PCR POSITIVE (A) NEGATIVE Final    Comment: RESULT CALLED TO, READ BACK BY AND VERIFIED WITH: HOLLY, RN @ 0301 ON 02/01/20 C VARNER (NOTE) SARS-CoV-2 target nucleic acids are DETECTED.  The SARS-CoV-2 RNA is generally detectable in upper respiratory specimens during the acute phase of infection. Positive results are indicative of the presence of the identified virus, but do not rule out bacterial infection or co-infection with other pathogens not detected by the test. Clinical correlation  with patient history and other diagnostic information is necessary to determine patient infection status. The expected result is Negative.  Fact Sheet for Patients: EntrepreneurPulse.com.au  Fact Sheet for Healthcare Providers: IncredibleEmployment.be  This test is not yet approved or cleared by the [place] FDA and  has been authorized for detection and/or diagnosis of SARS-CoV-2 by FDA under an Emergency Use Authorization (EUA).  This EUA will remain in effect (meaning this test can  be used) for the duration of  the COVID-19 declaration under Section 564(b)(1) of the Act, 21 U.S.C. section 360bbb-3(b)(1),  unless the authorization is terminated or revoked sooner.     Influenza A by PCR NEGATIVE NEGATIVE Final   Influenza B by PCR NEGATIVE NEGATIVE Final    Comment: (NOTE) The Xpert Xpress SARS-CoV-2/FLU/RSV plus assay is intended as an aid in the diagnosis of influenza from Nasopharyngeal swab specimens and should not be used as a sole basis for treatment. Nasal washings and aspirates are unacceptable for Xpert Xpress SARS-CoV-2/FLU/RSV testing.  Fact Sheet for Patients: EntrepreneurPulse.com.au  Fact Sheet for Healthcare Providers: IncredibleEmployment.be  This test is not yet approved or cleared by the [place] FDA and has been authorized for detection and/or diagnosis of SARS-CoV-2 by FDA under an Emergency Use Authorization (EUA). This EUA will remain in effect (meaning this test can be used) for the duration of the COVID-19 declaration under Section 564(b)(1) of the Act, 21 U.S.C. section 360bbb-3(b)(1), unless the authorization is terminated or revoked.  Performed at [facility], [place] [address], [place], [place] [address]   Urine culture     Status: None   Collection Time: 02/01/20  2:00 AM   Specimen: In/Out Cath Urine  Result Value Ref Range Status   Specimen  Description   Final    IN/OUT CATH URINE Performed at [facility] [address], [place], [place] [address]    Special Requests   Final    NONE Performed at [facility], [place] [address] [place], [place] [address]    Culture   Final    NO GROWTH Performed at [facility], [place] [address], [place], [place] [address]    Report Status 02/02/2020 FINAL  Final  MRSA PCR Screening     Status: None   Collection Time: 02/01/20  9:27 AM   Specimen: Nasopharyngeal  Result Value Ref Range Status   MRSA by PCR NEGATIVE NEGATIVE Final    Comment:        The GeneXpert MRSA Assay (FDA approved for NASAL specimens only), is one component of a comprehensive MRSA colonization surveillance program. It is not intended to diagnose MRSA infection nor to guide or monitor treatment for MRSA infections. Performed at [facility], [place] [address] [place], [place] [address]       Studies:  No results found.  Assessment: 60 y.o. female   1. 1. Metastatic pancreatic cancer 2. Sepsis - unclear source, cultures negative  3. COVID-19 (+) 4. Abdominal pain, nausea, vomiting secondary to #1, improved  5. 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 Anemia 7. Mild thrombocytopenia from chemo  8. Transaminitis and hyperbilirubinemia, stable   Plan:  -I spoke with patient's husband before I called patient.  I discussed the overall very poor prognosis, and my recommendation is not resume chemo, due to her very poor tolerance and overall poor response to chemotherapy.  That life expectancy would be likely 2 to 4 months, or shorter if no TPN.  I recommend home hospice.  However, due to patient's underlying anxiety, she would not do well with hospice discussion without her husband being present.  We will hold off hospice referral at this time,  until I see her back in my clinic again.  The other reason not to start hospice is that they will unlikely cover TPN.  -Given the negative ID work-up, I think is okay to restart her TPN tonight -Please continue home care on discharge,  she is not happy with the advanced home care, I encouraged her to discuss with social worker/case manager to see if there are other home care options -I spoke with pt on the phone, and reviewed her lab results, discussed f/u plan in 3 weeks (COVID policy in our office) and she knows to call us if needed before next visit  -I will f/u as needed before discharge   I spent a total of 30 mins with pt and her husband and coordinate her care.    Truitt Merle, MD 02/04/2020  2:54 PM

## 2020-02-04 NOTE — Addendum Note (Signed)
Addended by: Tora Kindred on: 02/04/2020 09:30 AM   Modules accepted: Orders

## 2020-02-04 NOTE — Progress Notes (Signed)
Initial Nutrition Assessment   INTERVENTION:   -Resume home cyclic TPN regimen  -Ensure Enlive po BID, each supplement provides 350 kcal and 20 grams of protein (mixed with milk) -Encourage PO as tolerated  NUTRITION DIAGNOSIS:   Increased nutrient needs related to cancer and cancer related treatments as evidenced by estimated needs.  GOAL:   Patient will meet greater than or equal to 90% of their needs  MONITOR:   PO intake,Supplement acceptance,Labs,Weight trends,I & O's  REASON FOR ASSESSMENT:   Malnutrition Screening Tool    ASSESSMENT:   60 y.o. female with a history of metastatic pancreatic adenocarcinoma diagnosed August 2021 poorly tolerating chemotherapy most recently on dose-reduced 2nd line gemcitabine 01/29/2020 who presented to the ED 1/7 with abdominal pain, nausea and vomiting.  Patient has continued to struggle with PO intakes and appetite d/t N/V and abdominal pain. Pt now COVID-19+ with minimal symptoms.  Per oncology note today, pt may resume TPN.  Taking in liquids, will order Ensure supplements if pt feels up to drinking them.  Per weight records, pt has lost 8 lbs since 12/13 (4% wt loss x 1 month, insignificant for time frame). Weights are trending down regardless of significance. Suspect malnutrition continues.  Medications: Vitamin C, Creon, Zinc sulfate  Labs reviewed: Low K  NUTRITION - FOCUSED PHYSICAL EXAM:  Unable to complete  Diet Order:   Diet Order            Diet regular Room service appropriate? Yes; Fluid consistency: Thin  Diet effective now                 EDUCATION NEEDS:   No education needs have been identified at this time  Skin:  Skin Assessment: Reviewed RN Assessment  Last BM:  1/10  Height:   Ht Readings from Last 1 Encounters:  02/04/20 5\' 7"  (1.702 m)    Weight:   Wt Readings from Last 1 Encounters:  02/04/20 77.5 kg   BMI:  Body mass index is 26.76 kg/m.  Estimated Nutritional Needs:   Kcal:   2200-2400  Protein:  115-130g  Fluid:  2.2L/day  Clayton Bibles, MS, RD, LDN Inpatient Clinical Dietitian Contact information available via Amion

## 2020-02-04 NOTE — Progress Notes (Signed)
PROGRESS NOTE  Cynthia Frye  PIR:[id number] DOB: Apr 17, 1960 DOA: 02/01/2020 PCP: Marden Noble, MD  Outpatient Specialists: Oncology, Dr. Mosetta Putt Brief Narrative: Cynthia Frye is a 60 y.o. female with a history of metastatic pancreatic adenocarcinoma diagnosed August 2021 poorly tolerating chemotherapy most recently on dose-reduced 2nd line gemcitabine 01/29/2020 who presented to the ED 1/7 with abdominal pain, nausea and vomiting. She was febrile to 101.35F, tachypneic to 30/min, with sinus tachycardia in 140's without hypotension. WBC elevated to 11.3 (in setting of taking steroids), nevertheless suggestive of sepsis with unclear origin. Imaging revealing DVT of left CFV and known pancreatic cancer with extensive liver mets, peritoneal thickening and small volume ascites. SARS-CoV-2 PCR screen was positive, though she has no respiratory symptoms, hypoxia, nor pulmonary infiltrates.   Assessment & Plan: Active Problems:   COVID-19 virus infection   Sepsis (HCC)  Sepsis with unclear origin: CT evidence of pyelonephritis was reported, though only a band of hypoenhancement is seen in the left (contralateral to pain) kidney and no pyuria or urinary complaints. No evidence of cholecystitis or pancreatic inflammation. No hematuria. No pulmonary infection present. No SSTI on exam. No meningismus. With small volume ascites, SBP was considered though exam is not consistent with peritonitis.  - Continue empiric broad coverage in immunocompromised patient pending culture data. LOT likely 7 days, will d/w oncology.   Covid-19 infection: s/p Pfizer July, Aug 2021. No specific attributable symptoms. Doubt this is related to picture of sepsis. - Not candidate for mAb, so giving remdesivir while admitted (started 1/8) - No hypoxia. Hold steroids. - Continue isolation, currently planned 10 days  Acute left CFV DVT, chronic right-sided pulmonary emboli:  - Lovenox 1mg /kg q12h  Nausea, vomiting, GERD, protein  calorie malnutrition:  - IV antiemetics to be continued - Restart nocturnal TPN as soon as cleared by oncology, have been waiting  - PPI  Metastatic pancreatic cancer, elevated LFTs: Progressive despite chemotherapy which is also poorly tolerated. - Dr. Mosetta Putt to follow up with patient 1/10. GOC discussions ongoing regarding decision Re: discontinue chemotherapy vs. continue/add abraxane.   Pancytopenia, anemia of malignancy:  - Transfuse for hgb <8g/dl per heme/onc.  - Stable on recheck  Thrombocytopenia: Trending downward as are all cell lines with IV fluids. Timinig not necessarily consistent with HIT. No evidence of bleeding currently.  - Relatively stable.  SLE: No flare.   HTN:  - Holding home medications   DVT prophylaxis: Lovenox 1mg /kg q12h Code Status: Full Family Communication: None at bedside. Husband by phone 1/09 Disposition Plan:  Status is: Inpatient  Remains inpatient appropriate because:Inpatient level of care appropriate due to severity of illness  Dispo: The patient is from: Home              Anticipated d/c is to: Home              Anticipated d/c date is: 1 day              Patient currently is not medically stable to d/c.  Consultants:   Oncology  Procedures:   None  Antimicrobials:  Vancomycin, cefepime, flagyl   Subjective: No chest pain or dyspnea. Some nausea improved with IV medications. No vomiting. Glad to be up in a room on the floor.  Objective: Vitals:   02/04/20 0230 02/04/20 0311 02/04/20 0648 02/04/20 1112  BP:  (!) 141/94 (!) 153/98 (!) 148/94  Pulse: (!) 101 (!) 123 (!) 127 (!) 121  Resp: 12 20 20 20   Temp: 97.9  F (36.6 C) 99.8 F (37.7 C) 99.4 F (37.4 C) 99 F (37.2 C)  TempSrc:  Oral Oral Oral  SpO2: 99% 98% 100% 100%  Weight:  77.5 kg    Height:  5\' 7"  (1.702 m)      Intake/Output Summary (Last 24 hours) at 02/04/2020 1248 Last data filed at 02/03/2020 2100 Gross per 24 hour  Intake 100 ml  Output 1500 ml  Net  -1400 ml   Filed Weights   02/01/20 0020 02/04/20 0311  Weight: 72.6 kg 77.5 kg   Gen: Chronically ill-appearing female in no distress Pulm: Nonlabored breathing room air. Clear. CV: Regular tachycardia. No murmur, rub, or gallop. No JVD, no dependent edema. GI: Abdomen soft, minimally tender diffusely. Non-distended, with normoactive bowel sounds.  Ext: Warm, no deformities Skin: No rashes, lesions or ulcers on visualized skin. Neuro: Alert and oriented. No focal neurological deficits. Psych: Judgement and insight appear fair. Mood euthymic & affect congruent. Behavior is appropriate.  Data Reviewed: I have personally reviewed following labs and imaging studies  Lab Results  Component Value Date   TSH 2.545 11/05/2019    CBC: Recent Labs  Lab 01/29/20 1056 02/01/20 0108 02/02/20 0703 02/03/20 0500 02/04/20 1201  WBC 13.5* 11.3* 8.9 3.7* 7.7  NEUTROABS 10.7* 9.9* 7.4 2.4 5.8  HGB 10.8* 9.4* 9.8* 8.5* 8.4*  HCT 33.7* 30.4* 31.3* 26.0* 25.7*  MCV 91.1 95.9 96.0 92.2 91.5  PLT 261 139* 109* 85* 83*   Basic Metabolic Panel: Recent Labs  Lab 01/29/20 1056 02/01/20 0108 02/02/20 0703 02/03/20 0500  NA 141 137 137 137  K 3.6 4.0 4.1 3.4*  CL 99 103 106 107  CO2 34* 25 21* 22  GLUCOSE 119* 165* 89 75  BUN 21* 19 17 13   CREATININE 0.71 0.64 0.68 0.80  CALCIUM 9.0 8.6* 8.5* 8.5*  MG  --   --  1.9  --   PHOS  --   --  3.4  --    GFR: Estimated Creatinine Clearance: 82.3 mL/min (by C-G formula based on SCr of 0.8 mg/dL). Liver Function Tests: Recent Labs  Lab 01/29/20 1056 02/01/20 0108 02/02/20 0703 02/03/20 0500  AST 96* 119* 106* 85*  ALT 69* 63* 58* 54*  ALKPHOS 378* 226* 209* 232*  BILITOT 1.7* 2.5* 1.6* 1.4*  PROT 8.6* 7.5 7.3 6.2*  ALBUMIN 2.5* 2.4* 2.1* 1.8*   Recent Labs  Lab 02/01/20 0108  LIPASE 20   No results for input(s): AMMONIA in the last 168 hours. Coagulation Profile: Recent Labs  Lab 02/01/20 0132 02/02/20 0703  INR 1.4* 1.3*    Cardiac Enzymes: No results for input(s): CKTOTAL, CKMB, CKMBINDEX, TROPONINI in the last 168 hours. BNP (last 3 results) No results for input(s): PROBNP in the last 8760 hours. HbA1C: No results for input(s): HGBA1C in the last 72 hours. CBG: No results for input(s): GLUCAP in the last 168 hours. Lipid Profile: No results for input(s): CHOL, HDL, LDLCALC, TRIG, CHOLHDL, LDLDIRECT in the last 72 hours. Thyroid Function Tests: No results for input(s): TSH, T4TOTAL, FREET4, T3FREE, THYROIDAB in the last 72 hours. Anemia Panel: Recent Labs    02/02/20 0703  FERRITIN 6,234*   Urine analysis:    Component Value Date/Time   COLORURINE AMBER (A) 02/01/2020 0200   APPEARANCEUR CLEAR 02/01/2020 0200   LABSPEC 1.016 02/01/2020 0200   PHURINE 9.0 (H) 02/01/2020 0200   GLUCOSEU NEGATIVE 02/01/2020 0200   HGBUR NEGATIVE 02/01/2020 0200   [place] NEGATIVE 02/01/2020 0200  KETONESUR NEGATIVE 02/01/2020 0200   PROTEINUR NEGATIVE 02/01/2020 0200   NITRITE NEGATIVE 02/01/2020 0200   LEUKOCYTESUR NEGATIVE 02/01/2020 0200   Recent Results (from the past 240 hour(s))  Blood Culture (routine x 2)     Status: None (Preliminary result)   Collection Time: 02/01/20  1:08 AM   Specimen: BLOOD  Result Value Ref Range Status   Specimen Description   Final    BLOOD BLOOD RIGHT FOREARM Performed at [facility] [address], [place], [place] [address]    Special Requests   Final    BOTTLES DRAWN AEROBIC AND ANAEROBIC Blood Culture results may not be optimal due to an inadequate volume of blood received in culture bottles Performed at [facility] [address], [place], [place] [address]    Culture   Final    NO GROWTH 3 DAYS Performed at [facility], [place] [address] [place], [place] [address]    Report Status PENDING  Incomplete  Blood Culture (routine x 2)     Status: None (Preliminary result)   Collection Time: 02/01/20  1:32 AM    Specimen: BLOOD  Result Value Ref Range Status   Specimen Description   Final    BLOOD RIGHT ANTECUBITAL Performed at [facility] [address], [place], [place] [address]    Special Requests   Final    BOTTLES DRAWN AEROBIC AND ANAEROBIC Blood Culture adequate volume Performed at [facility] [address] [place], [place] [address]    Culture   Final    NO GROWTH 3 DAYS Performed at [facility], [place] [address] [place], [place] [address]    Report Status PENDING  Incomplete  Resp Panel by RT-PCR (Flu A&B, Covid) Nasopharyngeal Swab     Status: Abnormal   Collection Time: 02/01/20  1:32 AM   Specimen: Nasopharyngeal Swab; Nasopharyngeal(NP) swabs in vial transport medium  Result Value Ref Range Status   SARS Coronavirus 2 by RT PCR POSITIVE (A) NEGATIVE Final    Comment: RESULT CALLED TO, READ BACK BY AND VERIFIED WITH: HOLLY, RN @ 0301 ON 02/01/20 C VARNER (NOTE) SARS-CoV-2 target nucleic acids are DETECTED.  The SARS-CoV-2 RNA is generally detectable in upper respiratory specimens during the acute phase of infection. Positive results are indicative of the presence of the identified virus, but do not rule out bacterial infection or co-infection with other pathogens not detected by the test. Clinical correlation with patient history and other diagnostic information is necessary to determine patient infection status. The expected result is Negative.  Fact Sheet for Patients: EntrepreneurPulse.com.au  Fact Sheet for Healthcare Providers: IncredibleEmployment.be  This test is not yet approved or cleared by the [place] FDA and  has been authorized for detection and/or diagnosis of SARS-CoV-2 by FDA under an Emergency Use Authorization (EUA).  This EUA will remain in effect (meaning this test can  be used) for the duration of  the COVID-19 declaration under Section 564(b)(1) of the Act,  21 U.S.C. section 360bbb-3(b)(1), unless the authorization is terminated or revoked sooner.     Influenza A by PCR NEGATIVE NEGATIVE Final   Influenza B by PCR NEGATIVE NEGATIVE Final    Comment: (NOTE) The Xpert Xpress SARS-CoV-2/FLU/RSV plus assay is intended as an aid in the diagnosis of influenza from Nasopharyngeal swab specimens and should not be used as a sole basis for treatment. Nasal washings and aspirates are unacceptable for Xpert Xpress SARS-CoV-2/FLU/RSV testing.  Fact Sheet for Patients: EntrepreneurPulse.com.au  Fact Sheet for Healthcare Providers: IncredibleEmployment.be  This test is not yet approved or cleared by the [place] FDA and has been authorized for detection and/or diagnosis of SARS-CoV-2 by FDA under an Emergency Use Authorization (EUA). This EUA will remain in effect (meaning this test can be used) for the duration of the COVID-19 declaration under Section 564(b)(1) of the Act, 21 U.S.C. section 360bbb-3(b)(1), unless the authorization is terminated or revoked.  Performed at [facility], [place] [address], [place], [place] [address]   Urine culture     Status: None   Collection Time: 02/01/20  2:00 AM   Specimen: In/Out Cath Urine  Result Value Ref Range Status   Specimen Description   Final    IN/OUT CATH URINE Performed at [facility] [address], [place], [place] [address]    Special Requests   Final    NONE Performed at [facility], [place] [address] [place], [place] [address]    Culture   Final    NO GROWTH Performed at [facility], [place] [address], [place], [place] [address]    Report Status 02/02/2020 FINAL  Final  MRSA PCR Screening     Status: None   Collection Time: 02/01/20  9:27 AM   Specimen: Nasopharyngeal  Result Value Ref Range Status   MRSA by PCR NEGATIVE NEGATIVE Final    Comment:        The GeneXpert MRSA Assay  (FDA approved for NASAL specimens only), is one component of a comprehensive MRSA colonization surveillance program. It is not intended to diagnose MRSA infection nor to guide or monitor treatment for MRSA infections. Performed at [facility], [place] [address], [place], [place] [address]       Radiology Studies: No results found.  Scheduled Meds: . vitamin C  500 mg Oral Daily  . Chlorhexidine Gluconate Cloth  6 each Topical Daily  . enoxaparin  80 mg Subcutaneous Q12H  . lipase/protease/amylase  24,000 Units Oral TID WC  . metroNIDAZOLE  500 mg Oral Q8H  . zinc sulfate  220 mg Oral Daily   Continuous Infusions: . sodium chloride 100 mL/hr at 02/04/20 0330  . ceFEPime (MAXIPIME) IV 2 g (02/04/20 1157)  . remdesivir 100 mg in NS 100 mL 100 mg (02/04/20 1100)     LOS: 3 days   Time spent: 35 minutes.  Patrecia Pour, MD [facility] [email or web address] 02/04/2020, 12:48 PM

## 2020-02-04 NOTE — Progress Notes (Signed)
Pharmacy consulted after 12:30p to resume patient's home TPN.  Formulation received from [employer] ordered for tomorrow AM  Note potassium drawn at noon today low at 2.8; not addressed. Paged night coverage >> will give 4g Mag and 10x6 mEq KCl IV  F/u labs tomorrow AM  Reuel Boom, PharmD, BCPS [phone] 02/04/2020, 8:18 PM

## 2020-02-04 NOTE — Progress Notes (Signed)
Pharmacy Antibiotic Note  Cynthia Frye is a 60 y.o. female with metastatic pancreatic adenocarcinoma on gemcitabine PTA presented to the ED on  02/01/2020 with c/o abdominal pain, n/v and fever. COVID test came back positive.  She was started on vancomycin, metronidazole and cefepime on admission for sepsis.  Today, 02/04/2020: - day #3 abx - vancomycin d/ced on 1/9 - Tmax 99.8, wbc low - scr 0.80 (crcl~82) - all cultures have been negative thus far  Plan: - continue cefepime 2gm IV q8h - flagyl 500mg  PO q8h - f/u LOT for  abx ______________________________________________  Height: 5\' 7"  (170.2 cm) Weight: 77.5 kg (170 lb 13.7 oz) IBW/kg (Calculated) : 61.6  Temp (24hrs), Avg:98.8 F (37.1 C), Min:97.9 F (36.6 C), Max:99.8 F (37.7 C)  Recent Labs  Lab 01/29/20 1056 02/01/20 0108 02/01/20 0337 02/02/20 0703 02/03/20 0500  WBC 13.5* 11.3*  --  8.9 3.7*  CREATININE 0.71 0.64  --  0.68 0.80  LATICACIDVEN  --  2.5* 2.0*  --   --     Estimated Creatinine Clearance: 82.3 mL/min (by C-G formula based on SCr of 0.8 mg/dL).    Allergies  Allergen Reactions  . Cinnamon Other (See Comments)    Other reaction(s): Other (See Comments) Unknown  On allergy test Unknown  On allergy test  . Peanut-Containing Drug Products Hives  . Prednisone Other (See Comments)    Interacts with another medicine she is taking  . Corn Oil Hives  . Corn-Containing Products Hives  . Other Rash    Red grapefruit and naval oranges- lips tingling and facial rash Potatoes, tomatoes, garlic, oregano/basil caused headache Other reaction(s): migratory headache    Antimicrobials this admission: 1/7 rocephin x 1 1/7 vanc >> 1/9 1/7 cefepime >>  1/7 Flagyl >>   Microbiology results: 1/7 BCx: ngtd 1/7 I/O cath: NGF 1/7 MRSA PCR: neg 1/7 COVID+ (s/p pfizer vacc x 2, no booster)  Thank you for allowing pharmacy to be a part of this patient's care.  Lynelle Doctor 02/04/2020 10:44 AM

## 2020-02-05 ENCOUNTER — Other Ambulatory Visit: Payer: BC Managed Care – PPO

## 2020-02-05 ENCOUNTER — Ambulatory Visit: Payer: BC Managed Care – PPO

## 2020-02-05 LAB — CBC WITH DIFFERENTIAL/PLATELET
Abs Immature Granulocytes: 0.12 10*3/uL — ABNORMAL HIGH (ref 0.00–0.07)
Basophils Absolute: 0 10*3/uL (ref 0.0–0.1)
Basophils Relative: 0 %
Eosinophils Absolute: 0 10*3/uL (ref 0.0–0.5)
Eosinophils Relative: 0 %
HCT: 25.4 % — ABNORMAL LOW (ref 36.0–46.0)
Hemoglobin: 8.3 g/dL — ABNORMAL LOW (ref 12.0–15.0)
Immature Granulocytes: 1 %
Lymphocytes Relative: 9 %
Lymphs Abs: 0.8 10*3/uL (ref 0.7–4.0)
MCH: 30 pg (ref 26.0–34.0)
MCHC: 32.7 g/dL (ref 30.0–36.0)
MCV: 91.7 fL (ref 80.0–100.0)
Monocytes Absolute: 1 10*3/uL (ref 0.1–1.0)
Monocytes Relative: 10 %
Neutro Abs: 7.7 10*3/uL (ref 1.7–7.7)
Neutrophils Relative %: 80 %
Platelets: 83 10*3/uL — ABNORMAL LOW (ref 150–400)
RBC: 2.77 MIL/uL — ABNORMAL LOW (ref 3.87–5.11)
RDW: 17.3 % — ABNORMAL HIGH (ref 11.5–15.5)
WBC: 9.7 10*3/uL (ref 4.0–10.5)
nRBC: 0.5 % — ABNORMAL HIGH (ref 0.0–0.2)

## 2020-02-05 LAB — PREALBUMIN: Prealbumin: <5 mg/dL — ABNORMAL LOW (ref 18–38)

## 2020-02-05 LAB — COMPREHENSIVE METABOLIC PANEL
ALT: 188 U/L — ABNORMAL HIGH (ref 0–44)
AST: 389 U/L — ABNORMAL HIGH (ref 15–41)
Albumin: 2 g/dL — ABNORMAL LOW (ref 3.5–5.0)
Alkaline Phosphatase: 306 U/L — ABNORMAL HIGH (ref 38–126)
Anion gap: 6 (ref 5–15)
BUN: 5 mg/dL — ABNORMAL LOW (ref 6–20)
CO2: 24 mmol/L (ref 22–32)
Calcium: 8.1 mg/dL — ABNORMAL LOW (ref 8.9–10.3)
Chloride: 107 mmol/L (ref 98–111)
Creatinine, Ser: 0.62 mg/dL (ref 0.44–1.00)
GFR, Estimated: >60 mL/min (ref >60)
Glucose, Bld: 76 mg/dL (ref 70–99)
Potassium: 3.6 mmol/L (ref 3.5–5.1)
Sodium: 137 mmol/L (ref 135–145)
Total Bilirubin: 2.4 mg/dL — ABNORMAL HIGH (ref 0.3–1.2)
Total Protein: 6.7 g/dL (ref 6.5–8.1)

## 2020-02-05 LAB — GLUCOSE, CAPILLARY: Glucose-Capillary: 136 mg/dL — ABNORMAL HIGH (ref 70–99)

## 2020-02-05 LAB — PHOSPHORUS: Phosphorus: 2.2 mg/dL — ABNORMAL LOW (ref 2.5–4.6)

## 2020-02-05 LAB — TRIGLYCERIDES: Triglycerides: 47 mg/dL (ref <150)

## 2020-02-05 LAB — MAGNESIUM: Magnesium: 2.3 mg/dL (ref 1.7–2.4)

## 2020-02-05 MED ORDER — POTASSIUM CHLORIDE 10 MEQ/100ML IV SOLN
INTRAVENOUS | Status: AC
  Filled 2020-02-05: qty 100

## 2020-02-05 MED ORDER — POTASSIUM CHLORIDE 10 MEQ/100ML IV SOLN
INTRAVENOUS | Status: AC
  Administered 2020-02-05: 10 meq via INTRAVENOUS
  Filled 2020-02-05: qty 100

## 2020-02-05 MED ORDER — INSULIN ASPART 100 UNIT/ML ~~LOC~~ SOLN: 0.0000 [IU] | SUBCUTANEOUS | Status: DC

## 2020-02-05 MED ORDER — POTASSIUM PHOSPHATES 15 MMOLE/5ML IV SOLN
15.0000 mmol | Freq: Once | INTRAVENOUS | Status: AC
  Administered 2020-02-05: 15 mmol via INTRAVENOUS
  Filled 2020-02-05: qty 5

## 2020-02-05 MED ORDER — INSULIN ASPART 100 UNIT/ML ~~LOC~~ SOLN
0.0000 [IU] | SUBCUTANEOUS | Status: DC
  Administered 2020-02-05: 1 [IU] via SUBCUTANEOUS
  Administered 2020-02-06: 2 [IU] via SUBCUTANEOUS

## 2020-02-05 MED ORDER — TRACE MINERALS CU-MN-SE-ZN 300-55-60-3000 MCG/ML IV SOLN
INTRAVENOUS | Status: AC
  Filled 2020-02-05: qty 806.67

## 2020-02-05 MED ORDER — SODIUM CHLORIDE 0.9 % IV SOLN: INTRAVENOUS | Status: DC

## 2020-02-05 NOTE — Plan of Care (Signed)
  Problem: Education: Goal: Knowledge of General Education information will improve Description: Including pain rating scale, medication(s)/side effects and non-pharmacologic comfort measures Outcome: Progressing   Problem: Clinical Measurements: Goal: Respiratory complications will improve Outcome: Progressing   Problem: Activity: Goal: Risk for activity intolerance will decrease Outcome: Progressing   Problem: Coping: Goal: Level of anxiety will decrease Outcome: Progressing   Problem: Elimination: Goal: Will not experience complications related to bowel motility Outcome: Progressing Goal: Will not experience complications related to urinary retention Outcome: Progressing   Problem: Pain Managment: Goal: General experience of comfort will improve Outcome: Progressing   Problem: Safety: Goal: Ability to remain free from injury will improve Outcome: Progressing   Problem: Skin Integrity: Goal: Risk for impaired skin integrity will decrease Outcome: Progressing   

## 2020-02-05 NOTE — Progress Notes (Signed)
PIV consult: Discussed with [place], RN: recommended potassium run via port. Antibiotics via PIV as long as it is patent. Port site assessed, unremarkable.

## 2020-02-05 NOTE — Progress Notes (Signed)
PHARMACY - TOTAL PARENTERAL NUTRITION CONSULT NOTE   Indication: intolerance to enteral feeding  Patient Measurements: Height: 5\' 7"  (170.2 cm) Weight: 80.8 kg (178 lb 2.1 oz) IBW/kg (Calculated) : 61.6 TPN AdjBW (KG): 65.6 Body mass index is 27.9 kg/m. Usual Weight:   Assessment:  Patient is a 60 y/o F with metastatic pancreatic cancer on chemotherapy PTA admitted with possible sepsis. Patient was on TPN at night PTA managed by [employer]. Plan is to resume TPN in-house.   Glucose / Insulin: WNL Electrolytes: WNL except phos slightly low @ 2.2 Renal: SCr WNL, stable LFTs / TGs: LFts elevated and increasing, T bili 2.4/ TG wnl Prealbumin / albumin: <5/2 Intake / Output; MIVF: net neutral, good UOP; NS @ 100 ml/hr GI Imaging: N/A Surgeries / Procedures: N/A  Central access: port 10/11/19 TPN start date: 02/05/20 (in-house)  Nutritional Goals TPN Formula per [employer]: 2200 ml/1621 Kcal with 120 gm/day protein (15% AA), 260 gm/day dextrose, lipids 60 gm/day 3 x weekly. Cyclic TPN 12 hours with 1 hour ramp up/down.   Electrolytes per [employer]: KAc: 0, KCl: 78 meq, Mg: 11 meq, ca gluc: 0, Kphos 21.6 mmol, NaAc 247 meq, NaCl 65 meq, Naphos: 0  Current Nutrition:  Regular ordered + Ensure  Plan:  Now: Kphos 15 mmol iv once  At 1800: Resume 12 hour cyclic TPN with 1 hour ramp up/down providing 2200 ml of fluid, 1976 (1382 w/o lipids) Kcal, and 121 g protein Will proceed with lipids in TPN only on T/Th/Sa due to national shortage Standard electrolytes in TPN initially Electrolytes in TPN: 85mEq/L of Na, 14mEq/L of K, 54mEq/L of Ca, 91mEq/L of Mg, and 22mmol/L of Phos. Cl:Ac ratio 1:1 Add standard MVI and trace elements to TPN Initiate Sensitive 4 times daily SSI at 0200/0700/1400/2000 and adjust as needed  Reduce MIVF to KVO mL/hr at 1800 Monitor TPN labs on Mon/Thurs CMET, Mg, and Phos in AM  [place], Nicki Reaper D 02/05/2020,7:28 AM

## 2020-02-05 NOTE — Progress Notes (Signed)
   02/05/20 0532  Vitals  Temp 98.6 F (37 C)  Temp Source Oral  BP (!) 141/91  MAP (mmHg) 106  BP Location Left Arm  BP Method Automatic  Patient Position (if appropriate) Sitting  Pulse Rate (!) 121  Pulse Rate Source Monitor  Resp 18  Oxygen Therapy  SpO2 100 %  O2 Device Room Air  Vitals from fall.

## 2020-02-05 NOTE — Progress Notes (Signed)
   02/05/20 0520  What Happened  Was fall witnessed? No  Was patient injured? No  Patient found on floor (coming back from bathrrom)  Found by Staff-comment (Lab)  Stated prior activity ambulating-unassisted  Follow Up  MD notified Harrold Donath, MD  Time MD notified [phone]  Family notified No - patient refusal (pt will call later)  Additional tests No  Progress note created (see row info) Yes  Adult Fall Risk Assessment  Risk Factor Category (scoring not indicated) Fall has occurred during this admission (document High fall risk)  Patient Fall Risk Level High fall risk  Adult Fall Risk Interventions  Required Bundle Interventions *See Row Information* High fall risk - low, moderate, and high requirements implemented  Additional Interventions Use of appropriate toileting equipment (bedpan, BSC, etc.);Room near nurses station  Screening for Fall Injury Risk (To be completed on HIGH fall risk patients) - Assessing Need for Low Bed  Risk For Fall Injury- Low Bed Criteria Previous fall this admission  Will Implement Low Bed and Floor Mats Low bed contraindicated, floor mats in place  Pain Assessment  Pain Scale 0-10  Pain Score 0  PCA/Epidural/Spinal Assessment  Respiratory Pattern Regular;Unlabored  Neurological  Neuro (WDL) WDL  Level of Consciousness Alert  Glasgow Coma Scale  Eye Opening 4  Best Verbal Response (NON-intubated) 5  Best Motor Response 6  Glasgow Coma Scale Score 15  Musculoskeletal  Musculoskeletal (WDL) X  Assistive Device None  Generalized Weakness Yes  Integumentary  Integumentary (WDL) X  Skin Color Appropriate for ethnicity  Skin Condition Dry;Flaky  Skin Integrity Intact  Skin Turgor Non-tenting

## 2020-02-05 NOTE — Progress Notes (Signed)
   02/05/20 0532  Assess: MEWS Score  Temp 98.6 F (37 C)  BP (!) 141/91  Pulse Rate (!) 121  Resp 18  SpO2 100 %  O2 Device Room Air  Assess: MEWS Score  MEWS Temp 0  MEWS Systolic 0  MEWS Pulse 2  MEWS RR 0  MEWS LOC 0  MEWS Score 2  MEWS Score Color Yellow  Assess: if the MEWS score is Yellow or Red  Were vital signs taken at a resting state? No  Focused Assessment No change from prior assessment  Early Detection of Sepsis Score *See Row Information* Low  MEWS guidelines implemented *See Row Information* Yes  Take Vital Signs  Increase Vital Sign Frequency  Yellow: Q 2hr X 2 then Q 4hr X 2, if remains yellow, continue Q 4hrs  Notify: Charge Nurse/RN  Name of Charge Nurse/RN Notified Rich Fuchs, RN  Date Charge Nurse/RN Notified 02/05/20  Time Charge Nurse/RN Notified [id number]  Notify: Provider  Provider Name/Title Harrold Donath, MD  Date Provider Notified 02/05/20  Time Provider Notified 0532  Notification Type Page  Notification Reason Other (Comment) (Patient fell)  Response No new orders  Date of Provider Response 02/05/20  Time of Provider Response [phone]

## 2020-02-05 NOTE — Progress Notes (Signed)
PROGRESS NOTE  Cynthia Frye  IRC:[id number] DOB: 11-01-1960 DOA: 02/01/2020 PCP: Josetta Huddle, MD  Outpatient Specialists: Oncology, Dr. Burr Medico Brief Narrative: Cynthia Frye is a 60 y.o. female with a history of metastatic pancreatic adenocarcinoma diagnosed August 2021 poorly tolerating chemotherapy most recently on dose-reduced 2nd line gemcitabine 01/29/2020 who presented to the ED 1/7 with abdominal pain, nausea and vomiting. She was febrile to 101.44F, tachypneic to 30/min, with sinus tachycardia in 140's without hypotension. WBC elevated to 11.3 (in setting of taking steroids), nevertheless suggestive of sepsis with unclear origin. Imaging revealing DVT of left CFV and known pancreatic cancer with extensive liver mets, peritoneal thickening and small volume ascites. SARS-CoV-2 PCR screen was positive, though she has no respiratory symptoms, hypoxia, nor pulmonary infiltrates.   Assessment & Plan: Active Problems:   COVID-19 virus infection   Sepsis ([place])  Sepsis with unclear origin: CT evidence of pyelonephritis was reported, though only a band of hypoenhancement is seen in the left (contralateral to pain) kidney and no pyuria or urinary complaints. No evidence of cholecystitis or pancreatic inflammation. No hematuria. No pulmonary infection present. No SSTI on exam. No meningismus. With small volume ascites, SBP was considered though exam is not consistent with peritonitis.  - Continue empiric broad coverage in immunocompromised patient pending culture data. LOT likely 7 days, d/w oncology.   Covid-19 infection: s/p Pfizer July, Aug 2021. +1/7. No specific attributable symptoms. Doubt this is related to picture of sepsis. - Stop remdesivir (started 1/8) since LFTs rising. - No hypoxia. Hold steroids. - Continue isolation, currently planned 10 days  Fall: While in hospital, tripped over IV lines coming back from bathroom this AM. No LOC. No focal deficits currently. No tender MSK areas.   - Fall precautions  Acute left CFV DVT, chronic right-sided pulmonary emboli:  - Lovenox 1mg /kg q12h  LFT elevations: Worsening.  - Monitor.  Nausea, vomiting, GERD, severe protein calorie malnutrition: Prealbumin undetectable. - IV antiemetics. Still having vomiting, will restart TPN and continue IV fluids.  - PPI  Metastatic pancreatic cancer, elevated LFTs: Progressive despite chemotherapy which is also poorly tolerated. - Dr. Burr Medico to follow up after discharge. Has discussed with family.  Pancytopenia, anemia of malignancy:  - Transfuse for hgb <8g/dl per heme/onc.  - Stable on recheck  Thrombocytopenia: Trending downward as are all cell lines with IV fluids. Timinig not necessarily consistent with HIT. No evidence of bleeding currently.  - Relatively stable.  SLE: No flare.   HTN:  - Holding home medications   DVT prophylaxis: Lovenox 1mg /kg q12h Code Status: Full Family Communication: None at bedside. Will call husband again later today. Disposition Plan:  Status is: Inpatient  Remains inpatient appropriate because:Inpatient level of care appropriate due to severity of illness  Dispo: The patient is from: Home              Anticipated d/c is to: Home              Anticipated d/c date is: 1 day if labs improve              Patient currently is not medically stable to d/c.  Consultants:   Oncology  Procedures:   None  Antimicrobials:  Vancomycin, cefepime, flagyl   Subjective: [place] this morning coming back from the bathroom. No LOC/syncope. She tripped in the dark over the IV lines, called for help. No persistent pain, landed on her bottom. No HA, numbness, weakness.   Objective: Vitals:   02/05/20  0532 02/05/20 0650 02/05/20 0738 02/05/20 1345  BP: (!) 141/91 (!) 143/87 (!) 144/93 (!) 151/98  Pulse: (!) 121 (!) 113 (!) 110 (!) 110  Resp: 18 14 18 16   Temp: 98.6 F (37 C) 97.9 F (36.6 C) 99.6 F (37.6 C) 98.3 F (36.8 C)  TempSrc: Oral Oral Oral  Oral  SpO2: 100% 100% 100% 100%  Weight:      Height:        Intake/Output Summary (Last 24 hours) at 02/05/2020 1426 Last data filed at 02/05/2020 0600 Gross per 24 hour  Intake 1833.04 ml  Output 600 ml  Net 1233.04 ml   Filed Weights   02/01/20 0020 02/04/20 0311 02/04/20 1938  Weight: 72.6 kg 77.5 kg 80.8 kg   Gen: 60 y.o. female in no distress Pulm: Nonlabored breathing room air. Clear, diminished. CV: Regular tachycardia. No murmur, rub, or gallop. No JVD, no pitting dependent edema. GI: Abdomen soft, not significantly tender, non-distended, with normoactive bowel sounds.  Ext: Warm, no deformities Skin: No new rashes, lesions or ulcers on visualized skin. Port site c/d/i. Neuro: Alert and oriented. No focal neurological deficits. Psych: Judgement and insight appear fair. Mood euthymic & affect congruent. Behavior is appropriate.    Data Reviewed: I have personally reviewed following labs and imaging studies  Lab Results  Component Value Date   TSH 2.545 11/05/2019    CBC: Recent Labs  Lab 02/01/20 0108 02/02/20 0703 02/03/20 0500 02/04/20 1201 02/05/20 0513  WBC 11.3* 8.9 3.7* 7.7 9.7  NEUTROABS 9.9* 7.4 2.4 5.8 7.7  HGB 9.4* 9.8* 8.5* 8.4* 8.3*  HCT 30.4* 31.3* 26.0* 25.7* 25.4*  MCV 95.9 96.0 92.2 91.5 91.7  PLT 139* 109* 85* 83* 83*   Basic Metabolic Panel: Recent Labs  Lab 02/01/20 0108 02/02/20 0703 02/03/20 0500 02/04/20 1201 02/05/20 0513 02/05/20 0516  NA 137 137 137 136 137  --   K 4.0 4.1 3.4* 2.8* 3.6  --   CL 103 106 107 104 107  --   CO2 25 21* 22 20* 24  --   GLUCOSE 165* 89 75 93 76  --   BUN 19 17 13 7  5*  --   CREATININE 0.64 0.68 0.80 0.66 0.62  --   CALCIUM 8.6* 8.5* 8.5* 8.5* 8.1*  --   MG  --  1.9  --  1.6*  --  2.3  PHOS  --  3.4  --   --  2.2*  --    GFR: Estimated Creatinine Clearance: 83.9 mL/min (by C-G formula based on SCr of 0.62 mg/dL). Liver Function Tests: Recent Labs  Lab 02/01/20 0108 02/02/20 0703  02/03/20 0500 02/04/20 1201 02/05/20 0513  AST 119* 106* 85* 242* 389*  ALT 63* 58* 54* 128* 188*  ALKPHOS 226* 209* 232* 329* 306*  BILITOT 2.5* 1.6* 1.4* 2.1* 2.4*  PROT 7.5 7.3 6.2* 7.2 6.7  ALBUMIN 2.4* 2.1* 1.8* 2.3* 2.0*   Recent Labs  Lab 02/01/20 0108  LIPASE 20   No results for input(s): AMMONIA in the last 168 hours. Coagulation Profile: Recent Labs  Lab 02/01/20 0132 02/02/20 0703  INR 1.4* 1.3*   Cardiac Enzymes: No results for input(s): CKTOTAL, CKMB, CKMBINDEX, TROPONINI in the last 168 hours. BNP (last 3 results) No results for input(s): PROBNP in the last 8760 hours. HbA1C: No results for input(s): HGBA1C in the last 72 hours. CBG: No results for input(s): GLUCAP in the last 168 hours. Lipid Profile: Recent Labs  02/05/20 0525  TRIG 47   Thyroid Function Tests: No results for input(s): TSH, T4TOTAL, FREET4, T3FREE, THYROIDAB in the last 72 hours. Anemia Panel: No results for input(s): VITAMINB12, FOLATE, FERRITIN, TIBC, IRON, RETICCTPCT in the last 72 hours. Urine analysis:    Component Value Date/Time   COLORURINE AMBER (A) 02/01/2020 0200   APPEARANCEUR CLEAR 02/01/2020 0200   LABSPEC 1.016 02/01/2020 0200   PHURINE 9.0 (H) 02/01/2020 0200   GLUCOSEU NEGATIVE 02/01/2020 0200   HGBUR NEGATIVE 02/01/2020 0200   BILIRUBINUR NEGATIVE 02/01/2020 0200   KETONESUR NEGATIVE 02/01/2020 0200   PROTEINUR NEGATIVE 02/01/2020 0200   NITRITE NEGATIVE 02/01/2020 0200   LEUKOCYTESUR NEGATIVE 02/01/2020 0200   Recent Results (from the past 240 hour(s))  Blood Culture (routine x 2)     Status: None (Preliminary result)   Collection Time: 02/01/20  1:08 AM   Specimen: BLOOD  Result Value Ref Range Status   Specimen Description   Final    BLOOD BLOOD RIGHT FOREARM Performed at [facility], [place] [address], [place], [place] [address]    Special Requests   Final    BOTTLES DRAWN AEROBIC AND ANAEROBIC Blood Culture results may not be  optimal due to an inadequate volume of blood received in culture bottles Performed at [place] [address] [place], [place] [address]    Culture   Final    NO GROWTH 4 DAYS Performed at [facility], [place] [address], [place], [place] [address]    Report Status PENDING  Incomplete  Blood Culture (routine x 2)     Status: None (Preliminary result)   Collection Time: 02/01/20  1:32 AM   Specimen: BLOOD  Result Value Ref Range Status   Specimen Description   Final    BLOOD RIGHT ANTECUBITAL Performed at [place] [address], [place], [place] [address]    Special Requests   Final    BOTTLES DRAWN AEROBIC AND ANAEROBIC Blood Culture adequate volume Performed at [place] [address] [place], [place] [address]    Culture   Final    NO GROWTH 4 DAYS Performed at [facility], [place] [address], [place], [place] [address]    Report Status PENDING  Incomplete  Resp Panel by RT-PCR (Flu A&B, Covid) Nasopharyngeal Swab     Status: Abnormal   Collection Time: 02/01/20  1:32 AM   Specimen: Nasopharyngeal Swab; Nasopharyngeal(NP) swabs in vial transport medium  Result Value Ref Range Status   SARS Coronavirus 2 by RT PCR POSITIVE (A) NEGATIVE Final    Comment: RESULT CALLED TO, READ BACK BY AND VERIFIED WITH: HOLLY, RN @ 0301 ON 02/01/20 C VARNER (NOTE) SARS-CoV-2 target nucleic acids are DETECTED.  The SARS-CoV-2 RNA is generally detectable in upper respiratory specimens during the acute phase of infection. Positive results are indicative of the presence of the identified virus, but do not rule out bacterial infection or co-infection with other pathogens not detected by the test. Clinical correlation with patient history and other diagnostic information is necessary to determine patient infection status. The expected result is Negative.  Fact Sheet for  Patients: EntrepreneurPulse.com.au  Fact Sheet for Healthcare Providers: IncredibleEmployment.be  This test is not yet approved or cleared by the [place] FDA and  has been authorized for detection and/or diagnosis of SARS-CoV-2 by FDA under an Emergency Use Authorization (EUA).  This EUA will remain in effect (meaning this test can  be used) for the duration of  the COVID-19 declaration under Section 564(b)(1) of the Act, 21 U.S.C. section 360bbb-3(b)(1), unless the authorization is terminated or revoked sooner.     Influenza A by PCR NEGATIVE NEGATIVE Final   Influenza B by PCR NEGATIVE NEGATIVE Final    Comment: (NOTE) The Xpert Xpress SARS-CoV-2/FLU/RSV plus assay is intended as an aid in the diagnosis of influenza from Nasopharyngeal swab specimens and should not be used as a sole basis for treatment. Nasal washings and aspirates are unacceptable for Xpert Xpress SARS-CoV-2/FLU/RSV testing.  Fact Sheet for Patients: EntrepreneurPulse.com.au  Fact Sheet for Healthcare Providers: IncredibleEmployment.be  This test is not yet approved or cleared by the [place] FDA and has been authorized for detection and/or diagnosis of SARS-CoV-2 by FDA under an Emergency Use Authorization (EUA). This EUA will remain in effect (meaning this test can be used) for the duration of the COVID-19 declaration under Section 564(b)(1) of the Act, 21 U.S.C. section 360bbb-3(b)(1), unless the authorization is terminated or revoked.  Performed at [facility], [place] [address], [place], [place] [address]   Urine culture     Status: None   Collection Time: 02/01/20  2:00 AM   Specimen: In/Out Cath Urine  Result Value Ref Range Status   Specimen Description   Final    IN/OUT CATH URINE Performed at [place] [address] [place], [place] [address]    Special Requests    Final    NONE Performed at [facility], [place] [address], [place], [place] [address]    Culture   Final    NO GROWTH Performed at [facility], [place] [address], [place], [place] [address]    Report Status 02/02/2020 FINAL  Final  MRSA PCR Screening     Status: None   Collection Time: 02/01/20  9:27 AM   Specimen: Nasopharyngeal  Result Value Ref Range Status   MRSA by PCR NEGATIVE NEGATIVE Final    Comment:        The GeneXpert MRSA Assay (FDA approved for NASAL specimens only), is one component of a comprehensive MRSA colonization surveillance program. It is not intended to diagnose MRSA infection nor to guide or monitor treatment for MRSA infections. Performed at [facility], [place] [address], [place], [place] [address]       Radiology Studies: No results found.  Scheduled Meds: . vitamin C  500 mg Oral Daily  . Chlorhexidine Gluconate Cloth  6 each Topical Daily  . enoxaparin  80 mg Subcutaneous Q12H  . feeding supplement  237 mL Oral BID BM  . insulin aspart  0-9 Units Subcutaneous 4 times per day  . lipase/protease/amylase  24,000 Units Oral TID WC  . metroNIDAZOLE  500 mg Oral Q8H  . zinc sulfate  220 mg Oral Daily   Continuous Infusions: . sodium chloride 100 mL/hr at 02/05/20 0434  . sodium chloride    . ceFEPime (MAXIPIME) IV 2 g (02/05/20 0221)  . potassium chloride    . potassium PHOSPHATE IVPB (in mmol)    . remdesivir 100 mg in NS 100 mL 100 mg (02/04/20 1100)  . TPN CYCLIC-ADULT (ION)       LOS: 4 days   Time spent: 35 minutes.  Patrecia Pour, MD [facility] [email or web address] 02/05/2020, 2:26 PM

## 2020-02-06 ENCOUNTER — Other Ambulatory Visit: Payer: Self-pay

## 2020-02-06 ENCOUNTER — Inpatient Hospital Stay (HOSPITAL_COMMUNITY): Payer: BC Managed Care – PPO

## 2020-02-06 LAB — COMPREHENSIVE METABOLIC PANEL
ALT: 178 U/L — ABNORMAL HIGH (ref 0–44)
AST: 248 U/L — ABNORMAL HIGH (ref 15–41)
Albumin: 2 g/dL — ABNORMAL LOW (ref 3.5–5.0)
Alkaline Phosphatase: 306 U/L — ABNORMAL HIGH (ref 38–126)
Anion gap: 11 (ref 5–15)
BUN: 10 mg/dL (ref 6–20)
CO2: 24 mmol/L (ref 22–32)
Calcium: 8.4 mg/dL — ABNORMAL LOW (ref 8.9–10.3)
Chloride: 100 mmol/L (ref 98–111)
Creatinine, Ser: 0.58 mg/dL (ref 0.44–1.00)
GFR, Estimated: >60 mL/min (ref >60)
Glucose, Bld: 165 mg/dL — ABNORMAL HIGH (ref 70–99)
Potassium: 3.4 mmol/L — ABNORMAL LOW (ref 3.5–5.1)
Sodium: 135 mmol/L (ref 135–145)
Total Bilirubin: 2.3 mg/dL — ABNORMAL HIGH (ref 0.3–1.2)
Total Protein: 6.9 g/dL (ref 6.5–8.1)

## 2020-02-06 LAB — CBC WITH DIFFERENTIAL/PLATELET
Abs Immature Granulocytes: 0.59 10*3/uL — ABNORMAL HIGH (ref 0.00–0.07)
Basophils Absolute: 0.1 10*3/uL (ref 0.0–0.1)
Basophils Relative: 0 %
Eosinophils Absolute: 0.1 10*3/uL (ref 0.0–0.5)
Eosinophils Relative: 1 %
HCT: 26.2 % — ABNORMAL LOW (ref 36.0–46.0)
Hemoglobin: 8.5 g/dL — ABNORMAL LOW (ref 12.0–15.0)
Immature Granulocytes: 4 %
Lymphocytes Relative: 9 %
Lymphs Abs: 1.2 10*3/uL (ref 0.7–4.0)
MCH: 30 pg (ref 26.0–34.0)
MCHC: 32.4 g/dL (ref 30.0–36.0)
MCV: 92.6 fL (ref 80.0–100.0)
Monocytes Absolute: 1.7 10*3/uL — ABNORMAL HIGH (ref 0.1–1.0)
Monocytes Relative: 13 %
Neutro Abs: 9.6 10*3/uL — ABNORMAL HIGH (ref 1.7–7.7)
Neutrophils Relative %: 73 %
Platelets: 90 10*3/uL — ABNORMAL LOW (ref 150–400)
RBC: 2.83 MIL/uL — ABNORMAL LOW (ref 3.87–5.11)
RDW: 18.8 % — ABNORMAL HIGH (ref 11.5–15.5)
WBC: 13.3 10*3/uL — ABNORMAL HIGH (ref 4.0–10.5)
nRBC: 0.9 % — ABNORMAL HIGH (ref 0.0–0.2)

## 2020-02-06 LAB — GLUCOSE, CAPILLARY
Glucose-Capillary: 195 mg/dL — ABNORMAL HIGH (ref 70–99)
Glucose-Capillary: 124 mg/dL — ABNORMAL HIGH (ref 70–99)
Glucose-Capillary: 112 mg/dL — ABNORMAL HIGH (ref 70–99)
Glucose-Capillary: 87 mg/dL (ref 70–99)
Glucose-Capillary: 86 mg/dL (ref 70–99)

## 2020-02-06 LAB — CULTURE, BLOOD (ROUTINE X 2)
Culture: NO GROWTH
Culture: NO GROWTH
Special Requests: ADEQUATE

## 2020-02-06 LAB — BASIC METABOLIC PANEL
Anion gap: 10 (ref 5–15)
BUN: 10 mg/dL (ref 6–20)
CO2: 25 mmol/L (ref 22–32)
Calcium: 8.4 mg/dL — ABNORMAL LOW (ref 8.9–10.3)
Chloride: 100 mmol/L (ref 98–111)
Creatinine, Ser: 0.5 mg/dL (ref 0.44–1.00)
GFR, Estimated: >60 mL/min (ref >60)
Glucose, Bld: 96 mg/dL (ref 70–99)
Potassium: 3.3 mmol/L — ABNORMAL LOW (ref 3.5–5.1)
Sodium: 135 mmol/L (ref 135–145)

## 2020-02-06 LAB — PHOSPHORUS
Phosphorus: 1.3 mg/dL — ABNORMAL LOW (ref 2.5–4.6)
Phosphorus: 1.3 mg/dL — ABNORMAL LOW (ref 2.5–4.6)

## 2020-02-06 LAB — MAGNESIUM
Magnesium: 1.8 mg/dL (ref 1.7–2.4)
Magnesium: 2.1 mg/dL (ref 1.7–2.4)

## 2020-02-06 LAB — TSH: TSH: 1.347 u[IU]/mL (ref 0.350–4.500)

## 2020-02-06 MED ORDER — POTASSIUM PHOSPHATES 15 MMOLE/5ML IV SOLN
45.0000 mmol | Freq: Once | INTRAVENOUS | Status: AC
  Administered 2020-02-06: 45 mmol via INTRAVENOUS
  Filled 2020-02-06: qty 15

## 2020-02-06 MED ORDER — MAGNESIUM SULFATE 2 GM/50ML IV SOLN
2.0000 g | Freq: Once | INTRAVENOUS | Status: AC
  Administered 2020-02-06: 2 g via INTRAVENOUS
  Filled 2020-02-06: qty 50

## 2020-02-06 MED ORDER — INSULIN ASPART 100 UNIT/ML ~~LOC~~ SOLN
0.0000 [IU] | Freq: Four times a day (QID) | SUBCUTANEOUS | Status: DC
  Administered 2020-02-07: 1 [IU] via SUBCUTANEOUS

## 2020-02-06 MED ORDER — LACTATED RINGERS IV BOLUS
500.0000 mL | Freq: Once | INTRAVENOUS | Status: AC
  Administered 2020-02-06: 500 mL via INTRAVENOUS

## 2020-02-06 MED ORDER — IOHEXOL 9 MG/ML PO SOLN
500.0000 mL | ORAL | Status: AC
  Administered 2020-02-06 (×2): 500 mL via ORAL

## 2020-02-06 MED ORDER — LACTATED RINGERS IV SOLN: INTRAVENOUS | Status: DC

## 2020-02-06 MED ORDER — IOHEXOL 300 MG/ML  SOLN
100.0000 mL | Freq: Once | INTRAMUSCULAR | Status: AC | PRN
  Administered 2020-02-06: 100 mL via INTRAVENOUS

## 2020-02-06 MED ORDER — IOHEXOL 9 MG/ML PO SOLN
ORAL | Status: AC
  Filled 2020-02-06: qty 1000

## 2020-02-06 MED ORDER — TRACE MINERALS CU-MN-SE-ZN 300-55-60-3000 MCG/ML IV SOLN
INTRAVENOUS | Status: AC
  Filled 2020-02-06: qty 384

## 2020-02-06 MED ORDER — METOPROLOL TARTRATE 5 MG/5ML IV SOLN
5.0000 mg | Freq: Once | INTRAVENOUS | Status: AC
  Filled 2020-02-06: qty 5

## 2020-02-06 MED ORDER — METOPROLOL TARTRATE 5 MG/5ML IV SOLN
5.0000 mg | Freq: Three times a day (TID) | INTRAVENOUS | Status: DC
  Administered 2020-02-06 – 2020-02-07 (×4): 5 mg via INTRAVENOUS
  Filled 2020-02-06 (×3): qty 5

## 2020-02-06 NOTE — Progress Notes (Signed)
PROGRESS NOTE  Cynthia Frye  [id number] DOB: 10/31/1960 DOA: 02/01/2020 PCP: Josetta Huddle, MD  Outpatient Specialists: Oncology, Dr. Burr Medico Brief Narrative: Cynthia Frye is a 60 y.o. female with a history of metastatic pancreatic adenocarcinoma diagnosed August 2021 poorly tolerating chemotherapy most recently on dose-reduced 2nd line gemcitabine 01/29/2020 who presented to the ED 1/7 with abdominal pain, nausea and vomiting. She was febrile to 101.30F, tachypneic to 30/min, with sinus tachycardia in 140's without hypotension. WBC elevated to 11.3 (in setting of taking steroids), nevertheless suggestive of sepsis with unclear origin. Imaging revealing DVT of left CFV and known pancreatic cancer with extensive liver mets, peritoneal thickening and small volume ascites. SARS-CoV-2 PCR screen was positive, though she has no respiratory symptoms, hypoxia, nor pulmonary infiltrates.   Assessment & Plan: Active Problems:   COVID-19 virus infection   Sepsis ([place])  Sepsis with unclear origin: CT evidence of pyelonephritis was reported, though only a band of hypoenhancement is seen in the left (contralateral to pain) kidney and no pyuria or urinary complaints. No evidence of cholecystitis or pancreatic inflammation. No hematuria. No pulmonary infection present. No SSTI on exam. No meningismus. With small volume ascites, SBP was considered though exam is not consistent with peritonitis.  - Continue empiric broad coverage in immunocompromised patient pending culture data. LOT 7 days, d/w oncology.  - Febrile again overnight with continued abdominal pain. Will repeat CT abdomen/pelvis. Fever could be explained by clot burden.  Covid-19 infection: s/p Pfizer July, Aug 2021. +1/7. No specific attributable symptoms. Doubt this is related to picture of sepsis. - Received remdesivir, abbreviated course due to LFT rise. - No hypoxia. Hold steroids. - Continue isolation, currently planned 10 days  Fall:  While in hospital, tripped over IV lines coming back from bathroom this AM. No LOC. No focal deficits currently. No tender MSK areas.  - Fall precautions  Acute left CFV DVT, chronic right-sided pulmonary emboli:  - Lovenox 1mg /kg q12h  SVT: In setting of fever. Note previously negative cardiac catheterization, no chest pain or dyspnea or palpitations.  - Continue IV metoprolol which has improved rate, VSS.  - May have been precipitated by relative dehydration. We will continue IVF during day. - Supplementing Mg, K, Phos.  Hypokalemia, hypophosphatemia, hypomagnesemia:  - Supplementing.  LFT elevations: Stabilizing today.  - Monitor intermittently.  Nausea, vomiting, intolerance to enteral feeding, GERD, severe protein calorie malnutrition: Prealbumin undetectable. - IV antiemetics.  - IVF - PPI  Metastatic pancreatic cancer, elevated LFTs: Progressive despite chemotherapy which is also poorly tolerated. - Dr. Burr Medico to follow up after discharge. Has discussed with family.  Pancytopenia, anemia of malignancy:  - Transfuse for hgb <8g/dl per heme/onc.  - Stable on recheck  Thrombocytopenia: Trending downward as are all cell lines with IV fluids. Timinig not necessarily consistent with HIT. No evidence of bleeding currently.  - Relatively stable.  SLE: No flare.   HTN:  - Holding home medications   DVT prophylaxis: Lovenox 1mg /kg q12h Code Status: Full Family Communication: Husband by phone Disposition Plan:  Status is: Inpatient  Remains inpatient appropriate because:Inpatient level of care appropriate due to severity of illness  Dispo: The patient is from: Home              Anticipated d/c is to: Home              Anticipated d/c date is: 1 day if CT unrevealing, and clinically stabilizes.  Patient currently is not medically stable to d/c.  Consultants:   Oncology  Procedures:   None  Antimicrobials:  Vancomycin, cefepime, flagyl    Subjective: Reporting fast heart rate though she didn't feel this, it was told to her. No chest pain or dyspnea. Abdominal pain is constant, stable, still R > L but more diffuse. No dysuria or wounds.  Objective: Vitals:   02/06/20 0448 02/06/20 0553 02/06/20 0700 02/06/20 1000  BP: (!) 158/97 (!) 158/96 (!) 146/88 (!) 122/91  Pulse: (!) 143 (!) 137    Resp: 20 19 16 17   Temp: (!) 100.6 F (38.1 C) 98.8 F (37.1 C) 99.2 F (37.3 C) 97.7 F (36.5 C)  TempSrc: Oral Oral Oral   SpO2: 100% 100% 98%   Weight:      Height:        Intake/Output Summary (Last 24 hours) at 02/06/2020 1325 Last data filed at 02/06/2020 0700 Gross per 24 hour  Intake 3663.24 ml  Output 875 ml  Net 2788.24 ml   Filed Weights   02/01/20 0020 02/04/20 0311 02/04/20 1938  Weight: 72.6 kg 77.5 kg 80.8 kg   Gen: 60 y.o. female in no distress Pulm: Nonlabored breathing room air. Clear. CV: Regular tachycardia in 130's this AM. No murmur, rub, or gallop. No JVD, no dependent edema. GI: Abdomen soft, diffusely mildly tender, non-distended, with hypoactive BS. Ext: Warm, no deformities Skin: No new rashes, lesions or ulcers on visualized skin. Port site normal. Neuro: Alert and oriented. No focal neurological deficits. Psych: Judgement and insight appear fair. Mood euthymic & affect congruent. Behavior is appropriate.    Data Reviewed: I have personally reviewed following labs and imaging studies  Lab Results  Component Value Date   TSH 1.347 02/06/2020    CBC: Recent Labs  Lab 02/02/20 0703 02/03/20 0500 02/04/20 1201 02/05/20 0513 02/06/20 0500  WBC 8.9 3.7* 7.7 9.7 13.3*  NEUTROABS 7.4 2.4 5.8 7.7 9.6*  HGB 9.8* 8.5* 8.4* 8.3* 8.5*  HCT 31.3* 26.0* 25.7* 25.4* 26.2*  MCV 96.0 92.2 91.5 91.7 92.6  PLT 109* 85* 83* 83* 90*   Basic Metabolic Panel: Recent Labs  Lab 02/02/20 0703 02/03/20 0500 02/04/20 1201 02/05/20 0513 02/05/20 0516 02/06/20 0500  NA 137 137 136 137  --  135  K  4.1 3.4* 2.8* 3.6  --  3.4*  CL 106 107 104 107  --  100  CO2 21* 22 20* 24  --  24  GLUCOSE 89 75 93 76  --  165*  BUN 17 13 7  5*  --  10  CREATININE 0.68 0.80 0.66 0.62  --  0.58  CALCIUM 8.5* 8.5* 8.5* 8.1*  --  8.4*  MG 1.9  --  1.6*  --  2.3 1.8  PHOS 3.4  --   --  2.2*  --  1.3*   GFR: Estimated Creatinine Clearance: 83.9 mL/min (by C-G formula based on SCr of 0.58 mg/dL). Liver Function Tests: Recent Labs  Lab 02/02/20 0703 02/03/20 0500 02/04/20 1201 02/05/20 0513 02/06/20 0500  AST 106* 85* 242* 389* 248*  ALT 58* 54* 128* 188* 178*  ALKPHOS 209* 232* 329* 306* 306*  BILITOT 1.6* 1.4* 2.1* 2.4* 2.3*  PROT 7.3 6.2* 7.2 6.7 6.9  ALBUMIN 2.1* 1.8* 2.3* 2.0* 2.0*   Recent Labs  Lab 02/01/20 0108  LIPASE 20   No results for input(s): AMMONIA in the last 168 hours. Coagulation Profile: Recent Labs  Lab 02/01/20 0132 02/02/20 0703  INR 1.4* 1.3*   Cardiac Enzymes: No results for input(s): CKTOTAL, CKMB, CKMBINDEX, TROPONINI in the last 168 hours. BNP (last 3 results) No results for input(s): PROBNP in the last 8760 hours. HbA1C: No results for input(s): HGBA1C in the last 72 hours. CBG: Recent Labs  Lab 02/05/20 1953 02/06/20 0209 02/06/20 0925 02/06/20 1124  GLUCAP 136* 195* 124* 112*   Lipid Profile: Recent Labs    02/05/20 0525  TRIG 47   Thyroid Function Tests: Recent Labs    02/06/20 0503  TSH 1.347   Anemia Panel: No results for input(s): VITAMINB12, FOLATE, FERRITIN, TIBC, IRON, RETICCTPCT in the last 72 hours. Urine analysis:    Component Value Date/Time   COLORURINE AMBER (A) 02/01/2020 0200   APPEARANCEUR CLEAR 02/01/2020 0200   LABSPEC 1.016 02/01/2020 0200   PHURINE 9.0 (H) 02/01/2020 0200   GLUCOSEU NEGATIVE 02/01/2020 0200   HGBUR NEGATIVE 02/01/2020 0200   BILIRUBINUR NEGATIVE 02/01/2020 0200   KETONESUR NEGATIVE 02/01/2020 0200   PROTEINUR NEGATIVE 02/01/2020 0200   NITRITE NEGATIVE 02/01/2020 0200   LEUKOCYTESUR  NEGATIVE 02/01/2020 0200   Recent Results (from the past 240 hour(s))  Blood Culture (routine x 2)     Status: None   Collection Time: 02/01/20  1:08 AM   Specimen: BLOOD  Result Value Ref Range Status   Specimen Description   Final    BLOOD BLOOD RIGHT FOREARM Performed at [facility], [place] [address], [place], [place] [address]    Special Requests   Final    BOTTLES DRAWN AEROBIC AND ANAEROBIC Blood Culture results may not be optimal due to an inadequate volume of blood received in culture bottles Performed at [place] [address], [place], [place] [address]    Culture   Final    NO GROWTH 5 DAYS Performed at [facility], [place] [address] [place], [place] [address]    Report Status 02/06/2020 FINAL  Final  Blood Culture (routine x 2)     Status: None   Collection Time: 02/01/20  1:32 AM   Specimen: BLOOD  Result Value Ref Range Status   Specimen Description   Final    BLOOD RIGHT ANTECUBITAL Performed at [place] [address], [place], [place] [address]    Special Requests   Final    BOTTLES DRAWN AEROBIC AND ANAEROBIC Blood Culture adequate volume Performed at [place] [address], [place], [place] [address]    Culture   Final    NO GROWTH 5 DAYS Performed at [facility], [place] [address] [place],  [address]    Report Status 02/06/2020 FINAL  Final  Resp Panel by RT-PCR (Flu A&B, Covid) Nasopharyngeal Swab     Status: Abnormal   Collection Time: 02/01/20  1:32 AM   Specimen: Nasopharyngeal Swab; Nasopharyngeal(NP) swabs in vial transport medium  Result Value Ref Range Status   SARS Coronavirus 2 by RT PCR POSITIVE (A) NEGATIVE Final    Comment: RESULT CALLED TO, READ BACK BY AND VERIFIED WITH: HOLLY, RN @ 0301 ON 02/01/20 C VARNER (NOTE) SARS-CoV-2 target nucleic acids are DETECTED.  The SARS-CoV-2 RNA is generally detectable in upper  respiratory specimens during the acute phase of infection. Positive results are indicative of the presence of the identified virus, but do not rule out bacterial infection or co-infection with other pathogens not detected by the test. Clinical correlation with patient history and other diagnostic information is  necessary to determine patient infection status. The expected result is Negative.  Fact Sheet for Patients: EntrepreneurPulse.com.au  Fact Sheet for Healthcare Providers: IncredibleEmployment.be  This test is not yet approved or cleared by the [place] FDA and  has been authorized for detection and/or diagnosis of SARS-CoV-2 by FDA under an Emergency Use Authorization (EUA).  This EUA will remain in effect (meaning this test can  be used) for the duration of  the COVID-19 declaration under Section 564(b)(1) of the Act, 21 U.S.C. section 360bbb-3(b)(1), unless the authorization is terminated or revoked sooner.     Influenza A by PCR NEGATIVE NEGATIVE Final   Influenza B by PCR NEGATIVE NEGATIVE Final    Comment: (NOTE) The Xpert Xpress SARS-CoV-2/FLU/RSV plus assay is intended as an aid in the diagnosis of influenza from Nasopharyngeal swab specimens and should not be used as a sole basis for treatment. Nasal washings and aspirates are unacceptable for Xpert Xpress SARS-CoV-2/FLU/RSV testing.  Fact Sheet for Patients: EntrepreneurPulse.com.au  Fact Sheet for Healthcare Providers: IncredibleEmployment.be  This test is not yet approved or cleared by the [place] FDA and has been authorized for detection and/or diagnosis of SARS-CoV-2 by FDA under an Emergency Use Authorization (EUA). This EUA will remain in effect (meaning this test can be used) for the duration of the COVID-19 declaration under Section 564(b)(1) of the Act, 21 U.S.C. section 360bbb-3(b)(1), unless the authorization is  terminated or revoked.  Performed at [facility], [place] [address], [place], [place] [address]   Urine culture     Status: None   Collection Time: 02/01/20  2:00 AM   Specimen: In/Out Cath Urine  Result Value Ref Range Status   Specimen Description   Final    IN/OUT CATH URINE Performed at [place] [address] [place], [place] [address]    Special Requests   Final    NONE Performed at [facility], [place] [address] [place], [place] [address]    Culture   Final    NO GROWTH Performed at [facility], [place] [address], [place], [place] [address]    Report Status 02/02/2020 FINAL  Final  MRSA PCR Screening     Status: None   Collection Time: 02/01/20  9:27 AM   Specimen: Nasopharyngeal  Result Value Ref Range Status   MRSA by PCR NEGATIVE NEGATIVE Final    Comment:        The GeneXpert MRSA Assay (FDA approved for NASAL specimens only), is one component of a comprehensive MRSA colonization surveillance program. It is not intended to diagnose MRSA infection nor to guide or monitor treatment for MRSA infections. Performed at [facility], [place] [address], [place], [place] [address]       Radiology Studies: No results found.  Scheduled Meds: . iohexol      . vitamin C  500 mg Oral Daily  . Chlorhexidine Gluconate Cloth  6 each Topical Daily  . enoxaparin  80 mg Subcutaneous Q12H  . feeding supplement  237 mL Oral BID BM  . insulin aspart  0-9 Units Subcutaneous Q6H  . lipase/protease/amylase  24,000 Units Oral TID WC  . metoprolol tartrate  5 mg Intravenous Q8H  . metoprolol tartrate  5 mg Intravenous Once  . metroNIDAZOLE  500 mg Oral Q8H  . zinc sulfate  220 mg Oral Daily   Continuous Infusions: . sodium chloride    . ceFEPime (MAXIPIME) IV 2 g (02/06/20 0940)  .  potassium PHOSPHATE IVPB (in mmol) 45 mmol (02/06/20 0730)  . TPN ADULT (ION)    . TPN CYCLIC-ADULT (ION)  Stopped (02/06/20 0622)     LOS: 5 days   Time spent: 35 minutes.  Patrecia Pour, MD [facility] [email or web address] 02/06/2020, 1:25 PM

## 2020-02-06 NOTE — Plan of Care (Signed)
  Problem: Education: Goal: Knowledge of General Education information will improve Description: Including pain rating scale, medication(s)/side effects and non-pharmacologic comfort measures Outcome: Progressing   Problem: Activity: Goal: Risk for activity intolerance will decrease Outcome: Progressing   Problem: Elimination: Goal: Will not experience complications related to bowel motility Outcome: Progressing Goal: Will not experience complications related to urinary retention Outcome: Progressing   Problem: Safety: Goal: Ability to remain free from injury will improve Outcome: Progressing   Problem: Skin Integrity: Goal: Risk for impaired skin integrity will decrease Outcome: Progressing

## 2020-02-06 NOTE — Progress Notes (Signed)
PHARMACY - TOTAL PARENTERAL NUTRITION CONSULT NOTE   Indication: intolerance to enteral feeding  Patient Measurements: Height: 5\' 7"  (170.2 cm) Weight: 80.8 kg (178 lb 2.1 oz) IBW/kg (Calculated) : 61.6 TPN AdjBW (KG): 65.6 Body mass index is 27.9 kg/m. Usual Weight:   Assessment:  Patient is a 60 y/o F with metastatic pancreatic cancer on chemotherapy PTA admitted with possible sepsis. Patient was on TPN at night PTA managed by [employer]. Plan is to resume TPN in-house.   Glucose / Insulin: CBGs 136-195, SSI 3 units/24 hours Electrolytes: K slightly low at 3.4, phos low at 1.3 despite 15 mmol kphos yesterday, Mag 1.8, CCa= 10 Renal: SCr WNL, stable LFTs / TGs: LFts elevated but slighly decreased from yesterday, T bili 2.3/ TG wnl (1/11) Prealbumin / albumin: <5 (1/11)/2 Intake / Output; MIVF: not all UOP charted/ NS @ KVO GI Imaging: N/A Surgeries / Procedures: N/A  Central access: port 10/11/19 TPN start date: 02/05/20 (in-house)  Nutritional Goals TPN Formula per [employer]: 2200 ml/1621 Kcal with 120 gm/day protein (15% AA), 260 gm/day dextrose, lipids 60 gm/day 3 x weekly. Cyclic TPN 12 hours with 1 hour ramp up/down.   Electrolytes per [employer]: KAc: 0, KCl: 78 meq, Mg: 11 meq, ca gluc: 0, Kphos 21.6 mmol, NaAc 247 meq, NaCl 65 meq, Naphos: 0  RD recs (1/10): -   Fluid 2.2 L/day -   Kcal/day 2200-2400 -   g/day protein 115-130  Custom TPN at goal rate of 23ml/hr provides: -   g/day protein 130 (60 g/L) -   g/day Lipid 35 (16 g/L) -   g/day Dextrose 432 (20 %) -   Kcal/day 2333  Current Nutrition:  TPN + diet and Ensure (poorly tolerated)  Assessment/Plan:  Patient has evidence of refeeding with phos low and significantly decreased despite Kphos replacement yesterday. Will proceed with standard TPN vs cyclic for now until electrolytes are stable. Will recheck phos tonight  Now: Kphos 45 mmol iv once  Recheck electrolytes @ 1700 and replace as needed  At  1800: Initiate TPN @ 40 ml/hr providing 1037 Kcal and 58 g protein Standard electrolytes in TPN initially Electrolytes in TPN: 76mEq/L of Na, 86mEq/L of K, 38mEq/L of Ca, 64mEq/L of Mg, and 72mmol/L of Phos. Cl:Ac ratio 1:1 Add standard MVI and trace elements to TPN Initiate Sensitive q6h SSI at and adjust as needed  Continue MIVF at [facility] mL/hr at 1800 Monitor TPN labs on Mon/Thurs  Napoleon Form 02/06/2020,7:40 AM

## 2020-02-06 NOTE — Progress Notes (Signed)
   02/06/20 0448  Assess: MEWS Score  Temp (!) 100.6 F (38.1 C)  BP (!) 158/97  Pulse Rate (!) 143  Resp 20  SpO2 100 %  O2 Device Room Air  Assess: MEWS Score  MEWS Temp 1  MEWS Systolic 0  MEWS Pulse 3  MEWS RR 0  MEWS LOC 0  MEWS Score 4  MEWS Score Color Red  Assess: if the MEWS score is Yellow or Red  Were vital signs taken at a resting state? Yes  Focused Assessment No change from prior assessment  Early Detection of Sepsis Score *See Row Information* Medium  MEWS guidelines implemented *See Row Information* Yes  Treat  MEWS Interventions Administered scheduled meds/treatments  Take Vital Signs  Increase Vital Sign Frequency  Red: Q 1hr X 4 then Q 4hr X 4, if remains red, continue Q 4hrs  Escalate  MEWS: Escalate Red: discuss with charge nurse/RN and provider, consider discussing with RRT  Notify: Charge Nurse/RN  Name of Charge Nurse/RN Notified Rich Fuchs, RN  Date Charge Nurse/RN Notified 02/06/20  Time Charge Nurse/RN Notified 0455  Notify: Provider  Date Provider Notified 02/06/20  Time Provider Notified [phone]  Notification Type Page  Notification Reason Change in status  Response See new orders  Date of Provider Response 02/06/20  Time of Provider Response 0510  Notified Charge Nurse and Provider on call about patient's being Red MEWS. Will implement Red MEWS protocol.

## 2020-02-06 NOTE — Progress Notes (Signed)
Notified on call provider about patient's high heart rate and other vital signs. Also, alerted the on call provider about patient being in the red MEWS. On call provider gave new orders.

## 2020-02-06 NOTE — Progress Notes (Signed)
I contacted pt by phone. She states she is doing much better today. The chaplain offered caring and supportive presence and prayers and blessings. Pt was was grateful for the call.

## 2020-02-06 NOTE — Plan of Care (Signed)

## 2020-02-07 DIAGNOSIS — E440 Moderate protein-calorie malnutrition: Secondary | ICD-10-CM | POA: Diagnosis not present

## 2020-02-07 DIAGNOSIS — R112 Nausea with vomiting, unspecified: Secondary | ICD-10-CM | POA: Diagnosis not present

## 2020-02-07 DIAGNOSIS — R1310 Dysphagia, unspecified: Secondary | ICD-10-CM | POA: Diagnosis not present

## 2020-02-07 DIAGNOSIS — K529 Noninfective gastroenteritis and colitis, unspecified: Secondary | ICD-10-CM | POA: Diagnosis not present

## 2020-02-07 LAB — COMPREHENSIVE METABOLIC PANEL
ALT: 155 U/L — ABNORMAL HIGH (ref 0–44)
AST: 183 U/L — ABNORMAL HIGH (ref 15–41)
Albumin: 2 g/dL — ABNORMAL LOW (ref 3.5–5.0)
Alkaline Phosphatase: 299 U/L — ABNORMAL HIGH (ref 38–126)
Anion gap: 10 (ref 5–15)
BUN: 9 mg/dL (ref 6–20)
CO2: 23 mmol/L (ref 22–32)
Calcium: 8.3 mg/dL — ABNORMAL LOW (ref 8.9–10.3)
Chloride: 99 mmol/L (ref 98–111)
Creatinine, Ser: 0.53 mg/dL (ref 0.44–1.00)
GFR, Estimated: >60 mL/min (ref >60)
Glucose, Bld: 135 mg/dL — ABNORMAL HIGH (ref 70–99)
Potassium: 3.3 mmol/L — ABNORMAL LOW (ref 3.5–5.1)
Sodium: 132 mmol/L — ABNORMAL LOW (ref 135–145)
Total Bilirubin: 2.5 mg/dL — ABNORMAL HIGH (ref 0.3–1.2)
Total Protein: 6.6 g/dL (ref 6.5–8.1)

## 2020-02-07 LAB — MAGNESIUM: Magnesium: 1.8 mg/dL (ref 1.7–2.4)

## 2020-02-07 LAB — GLUCOSE, CAPILLARY
Glucose-Capillary: 108 mg/dL — ABNORMAL HIGH (ref 70–99)
Glucose-Capillary: 137 mg/dL — ABNORMAL HIGH (ref 70–99)
Glucose-Capillary: 128 mg/dL — ABNORMAL HIGH (ref 70–99)

## 2020-02-07 LAB — PHOSPHORUS: Phosphorus: 1.7 mg/dL — ABNORMAL LOW (ref 2.5–4.6)

## 2020-02-07 MED ORDER — HEPARIN SOD (PORK) LOCK FLUSH 100 UNIT/ML IV SOLN
500.0000 [IU] | INTRAVENOUS | Status: AC | PRN
  Administered 2020-02-07: 500 [IU]
  Filled 2020-02-07: qty 5

## 2020-02-07 MED ORDER — MAGNESIUM SULFATE 2 GM/50ML IV SOLN
2.0000 g | Freq: Once | INTRAVENOUS | Status: AC
  Administered 2020-02-07: 2 g via INTRAVENOUS
  Filled 2020-02-07: qty 50

## 2020-02-07 MED ORDER — POTASSIUM PHOSPHATES 15 MMOLE/5ML IV SOLN
30.0000 mmol | Freq: Once | INTRAVENOUS | Status: AC
  Administered 2020-02-07: 30 mmol via INTRAVENOUS
  Filled 2020-02-07: qty 10

## 2020-02-07 MED ORDER — INSULIN ASPART 100 UNIT/ML ~~LOC~~ SOLN: 0.0000 [IU] | Freq: Two times a day (BID) | SUBCUTANEOUS | Status: DC

## 2020-02-07 MED ORDER — METOPROLOL SUCCINATE ER 50 MG PO TB24
50.0000 mg | ORAL_TABLET | Freq: Every day | ORAL | Status: DC
  Administered 2020-02-07: 50 mg via ORAL
  Filled 2020-02-07: qty 1

## 2020-02-07 MED ORDER — TRACE MINERALS CU-MN-SE-ZN 300-55-60-3000 MCG/ML IV SOLN
INTRAVENOUS | Status: DC
  Filled 2020-02-07: qty 384

## 2020-02-07 NOTE — Progress Notes (Signed)
PHARMACY - TOTAL PARENTERAL NUTRITION CONSULT NOTE   Indication: intolerance to enteral feeding  Patient Measurements: Height: 5\' 7"  (170.2 cm) Weight: 80.8 kg (178 lb 2.1 oz) IBW/kg (Calculated) : 61.6 TPN AdjBW (KG): 65.6 Body mass index is 27.9 kg/m. Usual Weight:   Assessment:  Patient is a 60 y/o F with metastatic pancreatic cancer on chemotherapy PTA admitted with possible sepsis and found to have new DVT. Patient was on TPN at night PTA managed by [employer]. Plan is to resume TPN in-house per Pharmacy.  Glucose / Insulin: No Hx DM; CBGs well controlled - had dropped below 563 while off cyclic TPN, but stable now on 24-hr TPN - 1 unit Novolog since yesterday Electrolytes: Suspect refeeding; K and Phos remain low despite replenishment yesterday; Na, Cl borderline low; Mg WNL but trending down; Ca stable WNL Renal: SCr, BUN WNL, stable; bicarb low end of normal; UOP not charted LFTs / TGs: LFTs remain elevated but all continue to improve, T bili elevated (see CTa/p below; 1/11 TG wnl Prealbumin / albumin: <5 (1/11)/2 Intake / Output; MIVF: LR @ 90 ml/hr; NS @ KVO GI Imaging:  - 1/7 CTa/p: pancreatic CA w/ extensive liver metastases, gallbladder sludge w/o cholecystitis, and small volume ascites w/ possible peritoneal metastatic disease - 1/12 CTa/p: possible new LLQ focal enteritis of small bowel, otherwise unchanged Surgeries / Procedures: N/A  Central access: port 10/11/19 TPN start date: 02/05/20 (in-house)  Nutritional Goals TPN Formula per [employer]: 2200 ml over 12 hrs nightly; 1621 Kcal; 120 gm/day protein (15% AA), 260 gm/day dextrose, lipids 60 gm/day 3 x weekly. 1 hour ramp up/down.   Electrolytes per [employer] (mEq/L): Na 142, K 50, Ca 0, Mg 5, Phos 10 mmol  RD recs (1/10): -   Fluid 2.2 L/day -   Kcal/day 2200-2400 -   g/day protein 115-130  Custom TPN at goal rate of 56ml/hr provides: -   g/day protein 130 (60 g/L) -   g/day Lipid 35 (16 g/L) -   g/day Dextrose 432  (20 %) -   Kcal/day 2333  Current Nutrition:  Regular diet + Creon (charting 0% eaten so far), Ensure BID (taking 50%)  Assessment/Plan:  KPhos 30 mmol IV x 1 Mg 2g IV x 1  At 1800:  Continue TPN @ 40 ml/hr providing 1037 Kcal and 58 g protein  Electrolytes in TPN: increased Na, K, Mg, Phos; removed Ca to more closely match home TPN lytes  Na - 150 mEq/L  K - 80 mEq/L  Ca - 0 mEq/L   Mg - 12 mEq/L   Phos - 22 mmol/L   Cl:Ac ratio 1:1  Add standard MVI and trace elements to TPN  Continue sensitive SSI; reduce CBG checks to BID while TPN remains at half-rate  Continue LR at 90 ml/hr for now; if Na and Cl continue to drop tomorrow, recommend switching to NS at same rate  Monitor TPN labs on Mon/Thurs  Cmet, Mg, Phos tomorrow  Recheck Bmet/Phos tonight at 1800 and replace lytes as needed  Cynthia Frye A 02/07/2020,7:26 AM

## 2020-02-07 NOTE — Progress Notes (Signed)
Tobie Poet called to confirm D/C plans, to ensure pt has TPN enteral feeding continues. Pt d/c plan tentatively for after 5 pm, pt currently has K+-Phospate infusing and will complete at that time. MD made aware, Cookie, RN case manger & Tamsen Roers manager are aware. SRP, RN

## 2020-02-07 NOTE — Discharge Summary (Signed)
Physician Discharge Summary  Cynthia Frye [id number] DOB: [birth year] DOA: 02/01/2020  PCP: Josetta Huddle, MD  Admit date: 02/01/2020 Discharge date: 02/07/2020  Admitted From: Home Disposition: Home   Recommendations for Outpatient Follow-up:  1. Follow up with PCP/oncology as soon as possible (not able to be seen until isolation period ends per protocol) 2. Monitor LFTs  Home Health: RN Equipment/Devices: Continue TPN thru port Discharge Condition: Stable for discharge CODE STATUS: Full Diet recommendation: As tolerated, TPN to continue  Brief/Interim Summary: Cynthia Frye is a 60 y.o. female with a history of metastatic pancreatic adenocarcinoma diagnosed August 2021 poorly tolerating chemotherapy most recently on dose-reduced 2nd line gemcitabine 01/29/2020 who presented to the ED 1/7 with abdominal pain, nausea and vomiting. She was febrile to 101.6F, tachypneic to 30/min, with sinus tachycardia in 140's without hypotension. WBC elevated to 11.3 (in setting of taking steroids), nevertheless suggestive of sepsis with unclear origin. Imaging revealed DVT of left CFV and known pancreatic cancer with extensive liver mets, peritoneal thickening and small volume ascites with mention of abnormality around left (contralateral to pain) kidney. SARS-CoV-2 PCR screen was positive, though she has no respiratory symptoms, hypoxia, nor pulmonary infiltrates. Empiric broad spectrum antibiotics were given with improvement in hemodynamics and negative cultures. The patient has maximized benefit of hospitalization and will be discharged home in order to follow up with oncology.  Discharge Diagnoses:  Active Problems:   COVID-19 virus infection   Sepsis ([place])  Sepsis with unclear origin: CT evidence of pyelonephritis was reported, though only a band of hypoenhancement is seen in the left (contralateral to pain) kidney and no pyuria or urinary complaints. No evidence of cholecystitis or pancreatic  inflammation. No hematuria. No pulmonary infection present. No SSTI on exam. No meningismus. With small volume ascites, SBP was considered though exam is not consistent with peritonitis.  - Afebrile x >24 hours with no evidence of infection nidus on exam or imaging when repeated (see below). Has received 8 days of IV antibiotics.   Covid-19 infection: s/p Pfizer July, Aug 2021. +1/7. No specific attributable symptoms. Doubt this is related to picture of sepsis. - Received remdesivir, abbreviated course due to LFT rise. - No hypoxia. Hold steroids. - Continue isolation, currently planned 10 days  Fall: While in hospital x2. No LOC. No focal deficits currently. No tender MSK areas.  - Fall precautions.   Acute left CFV DVT, chronic right-sided pulmonary emboli: Left iliac vein DVT (10/2019), DVT in the left peroneal and gastrocnemiusveins (01/07/2020), DVT in the left common femoral vein (02/01/2020) - Lovenox 1mg /kg q12h  SVT: Resolved. In setting of holding home metoprolol. Has improved with restarting this medication. Asymptomatic. Note previously negative cardiac catheterization, no chest pain or dyspnea or palpitations.   Hypokalemia, hypophosphatemia, hypomagnesemia:  - Supplemented, will require continued monitoring.  LFT elevations: Showing signs of improvement  - Monitor at follow up  Nausea, vomiting, intolerance to enteral feeding, GERD, severe protein calorie malnutrition: Prealbumin undetectable. - Continue TPN and PPI - Vomiting has improved significantly.  Metastatic pancreatic cancer, elevated LFTs: Progressive despite chemotherapy which is also poorly tolerated. - Dr. Burr Medico to follow up after discharge. Has discussed with family. Recommends home hospice.  Pancytopenia, anemia of malignancy: Stable on recheck - Transfuse for hgb <8g/dl per heme/onc. Did not require transfusions.  Thrombocytopenia: No evidence of bleeding currently.  - Stable.  SLE: No flare.    HTN:  - Restart home medications   Discharge Instructions Discharge Instructions    Diet -  low sodium heart healthy   Complete by: As directed    Discharge instructions   Complete by: As directed    - Continue taking medications as you were before admission. You have completed therapy for covid and have received a full course of IV antibiotics.   - Per CDC guidelines, you will need to remain in isolation for 10 days from your positive covid test. - Follow up with your doctor in the next week via telehealth or seek medical attention right away if your symptoms get WORSE.  - Call Dr. Ernestina Penna office to schedule a follow up appointment.   Directions for you at home:  Wear a facemask You should wear a facemask that covers your nose and mouth when you are in the same room with other people and when you visit a healthcare provider. People who live with or visit you should also wear a facemask while they are in the same room with you.  Separate yourself from other people in your home As much as possible, you should stay in a different room from other people in your home. Also, you should use a separate bathroom, if available.  Avoid sharing household items You should not share dishes, drinking glasses, cups, eating utensils, towels, bedding, or other items with other people in your home. After using these items, you should wash them thoroughly with soap and water.  Cover your coughs and sneezes Cover your mouth and nose with a tissue when you cough or sneeze, or you can cough or sneeze into your sleeve. Throw used tissues in a lined trash can, and immediately wash your hands with soap and water for at least 20 seconds or use an alcohol-based hand rub.  Wash your [facility] your hands often and thoroughly with soap and water for at least 20 seconds. You can use an alcohol-based hand sanitizer if soap and water are not available and if your hands are not visibly dirty. Avoid touching  your eyes, nose, and mouth with unwashed hands.  Directions for those who live with, or provide care at home for you:  Limit the number of people who have contact with the patient If possible, have only one caregiver for the patient. Other household members should stay in another home or place of residence. If this is not possible, they should stay in another room, or be separated from the patient as much as possible. Use a separate bathroom, if available. Restrict visitors who do not have an essential need to be in the home.  Ensure good ventilation Make sure that shared spaces in the home have good air flow, such as from an air conditioner or an opened window, weather permitting.  Wash your hands often Wash your hands often and thoroughly with soap and water for at least 20 seconds. You can use an alcohol based hand sanitizer if soap and water are not available and if your hands are not visibly dirty. Avoid touching your eyes, nose, and mouth with unwashed hands. Use disposable paper towels to dry your hands. If not available, use dedicated cloth towels and replace them when they become wet.  Wear a facemask and gloves Wear a disposable facemask at all times in the room and gloves when you touch or have contact with the patient's blood, body fluids, and/or secretions or excretions, such as sweat, saliva, sputum, nasal mucus, vomit, urine, or feces.  Ensure the mask fits over your nose and mouth tightly, and do not touch it during use.  Throw out disposable facemasks and gloves after using them. Do not reuse. Wash your hands immediately after removing your facemask and gloves. If your personal clothing becomes contaminated, carefully remove clothing and launder. Wash your hands after handling contaminated clothing. Place all used disposable facemasks, gloves, and other waste in a lined container before disposing them with other household waste. Remove gloves and wash your hands immediately  after handling these items.  Do not share dishes, glasses, or other household items with the patient Avoid sharing household items. You should not share dishes, drinking glasses, cups, eating utensils, towels, bedding, or other items with a patient who is confirmed to have, or being evaluated for, COVID-19 infection. After the person uses these items, you should wash them thoroughly with soap and water.  Wash laundry thoroughly Immediately remove and wash clothes or bedding that have blood, body fluids, and/or secretions or excretions, such as sweat, saliva, sputum, nasal mucus, vomit, urine, or feces, on them. Wear gloves when handling laundry from the patient. Read and follow directions on labels of laundry or clothing items and detergent. In general, wash and dry with the warmest temperatures recommended on the label.  Clean all areas the individual has used often Clean all touchable surfaces, such as counters, tabletops, doorknobs, bathroom fixtures, toilets, phones, keyboards, tablets, and bedside tables, every day. Also, clean any surfaces that may have blood, body fluids, and/or secretions or excretions on them. Wear gloves when cleaning surfaces the patient has come in contact with. Use a diluted bleach solution (e.g., dilute bleach with 1 part bleach and 10 parts water) or a household disinfectant with a label that says EPA-registered for coronaviruses. To make a bleach solution at home, add 1 tablespoon of bleach to 1 quart (4 cups) of water. For a larger supply, add  cup of bleach to 1 gallon (16 cups) of water. Read labels of cleaning products and follow recommendations provided on product labels. Labels contain instructions for safe and effective use of the cleaning product including precautions you should take when applying the product, such as wearing gloves or eye protection and making sure you have good ventilation during use of the product. Remove gloves and wash hands immediately  after cleaning.  Monitor yourself for signs and symptoms of illness Caregivers and household members are considered close contacts, should monitor their health, and will be asked to limit movement outside of the home to the extent possible. Follow the monitoring steps for close contacts listed on the symptom monitoring form.  If you have additional questions, contact your local health department or call the epidemiologist on call at [phone] (available 24/7). This guidance is subject to change. For the most up-to-date guidance from [facility], please refer to their website: [email or web address]   MyChart COVID-19 home monitoring program   Complete by: Feb 07, 2020    Is the patient willing to use the [facility] for home monitoring?: Yes     Allergies as of 02/07/2020      Reactions   Cinnamon Other (See Comments)   Other reaction(s): Other (See Comments) Unknown  On allergy test Unknown  On allergy test   Peanut-containing Drug Products Hives   Prednisone Other (See Comments)   Interacts with another medicine she is taking   Corn Oil Hives   Corn-containing Products Hives   Other Rash   Red grapefruit and naval oranges- lips tingling and facial rash Potatoes, tomatoes, garlic, oregano/basil caused headache Other reaction(s): migratory headache  Medication List    TAKE these medications   dexamethasone 4 MG tablet Commonly known as: DECADRON Take 1 tablet (4 mg total) by mouth daily. Take 1 tablet once daily for 3 to 5 days after chemo   diclofenac 75 MG EC tablet Commonly known as: VOLTAREN Take 1 tablet (75 mg total) by mouth 2 (two) times daily as needed. What changed: when to take this   diphenoxylate-atropine 2.5-0.025 MG tablet Commonly known as: LOMOTIL Take 2 tablets by mouth 4 (four) times daily as needed for diarrhea or loose stools (use 2nd, if Imodium doesn't work. Max dose: 8 tablets/day.). What  changed: reasons to take this   dronabinol 2.5 MG capsule Commonly known as: MARINOL Take 1 capsule (2.5 mg total) by mouth 2 (two) times daily before a meal. What changed:   when to take this  reasons to take this   enoxaparin 80 MG/0.8ML injection Commonly known as: Lovenox Inject 0.8 mLs (80 mg total) into the skin every 12 (twelve) hours.   ferrous sulfate 325 (65 FE) MG tablet Take 325 mg by mouth daily with breakfast.   fluticasone 50 MCG/ACT nasal spray Commonly known as: FLONASE Place 2 sprays into both nostrils daily. What changed:   when to take this  reasons to take this   hydroxychloroquine 200 MG tablet Commonly known as: PLAQUENIL Take 400 mg by mouth daily.   loperamide 2 MG capsule Commonly known as: IMODIUM Take 2 capsules (4 mg total) by mouth 2 (two) times daily as needed for diarrhea or loose stools (Use 1st).   LORazepam 0.5 MG tablet Commonly known as: ATIVAN Take 1 tablet (0.5 mg total) by mouth every 8 (eight) hours as needed for anxiety (and anticiaptory n/v. DO NOT TAKE WITH XANAX).   losartan-hydrochlorothiazide 50-12.5 MG tablet Commonly known as: HYZAAR Take 1 tablet by mouth daily.   metoprolol succinate 50 MG 24 hr tablet Commonly known as: TOPROL-XL Take 50 mg by mouth daily.   mirtazapine 7.5 MG tablet Commonly known as: REMERON TAKE 1 TABLET BY MOUTH AT BEDTIME.   OLANZapine 7.5 MG tablet Commonly known as: ZYPREXA Take 1 tablet (7.5 mg total) by mouth at bedtime.   ondansetron 8 MG tablet Commonly known as: ZOFRAN Take 1 tablet (8 mg total) by mouth every 8 (eight) hours as needed for nausea or vomiting.   oxyCODONE 5 MG immediate release tablet Commonly known as: Oxy IR/ROXICODONE Take 1 tablet (5 mg total) by mouth every 6 (six) hours as needed for severe pain.   Pancrelipase (Lip-Prot-Amyl) 24000-76000 units Cpep Take 1 capsule (24,000 Units total) by mouth 3 (three) times daily with meals.   Rheumate Caps Take 1  capsule by mouth daily.   senna-docusate 8.6-50 MG tablet Commonly known as: Senokot-S Take 1 tablet by mouth 2 (two) times daily. What changed:   when to take this  reasons to take this   sterile water SOLN with amino acids 10 % SOLN 1.3 g/kg, dextrose 70 % SOLN 20 % Inject into the vein continuous. Pt on a 12 hour schedule. Last bag 01-31-20. Per husband. Pt is followed by [facility].   sucralfate 1 GM/10ML suspension Commonly known as: CARAFATE Take 10 mLs (1 g total) by mouth 4 (four) times daily -  with meals and at bedtime.   topiramate 50 MG tablet Commonly known as: Topamax Take 1 tablet (50 mg total) by mouth at bedtime. What changed:   when to take this  reasons to take  this   Vitamin D 50 MCG (2000 UT) Caps Take 2,000 Units by mouth daily.       Follow-up Information    Josetta Huddle, MD Follow up.   Specialty: Internal Medicine Contact information: [address] [place] [address] [phone]        Truitt Merle, MD Follow up.   Specialties: Hematology, Oncology Contact information: [address] [place]  [address] [phone]              Allergies  Allergen Reactions  . Cinnamon Other (See Comments)    Other reaction(s): Other (See Comments) Unknown  On allergy test Unknown  On allergy test  . Peanut-Containing Drug Products Hives  . Prednisone Other (See Comments)    Interacts with another medicine she is taking  . Corn Oil Hives  . Corn-Containing Products Hives  . Other Rash    Red grapefruit and naval oranges- lips tingling and facial rash Potatoes, tomatoes, garlic, oregano/basil caused headache Other reaction(s): migratory headache    Consultations:  Oncology  Procedures/Studies: CT ANGIO CHEST PE W OR WO CONTRAST  Result Date: 02/01/2020 CLINICAL DATA:  Increasing right rib pain for 1 day. Nausea and vomiting EXAM: CT ANGIOGRAPHY CHEST WITH CONTRAST TECHNIQUE: Multidetector CT  imaging of the chest was performed using the standard protocol during bolus administration of intravenous contrast. Multiplanar CT image reconstructions and MIPs were obtained to evaluate the vascular anatomy. CONTRAST:  140mL OMNIPAQUE IOHEXOL 350 MG/ML SOLN COMPARISON:  01/11/2020 FINDINGS: Cardiovascular: Normal heart size. No pericardial effusion. No acute aortic finding. Extensive motion and streak artifact. Pulmonary emboli are again seen at the bifurcation of the right main pulmonary artery, peripheral within the vessel and nonacute. More peripheral pulmonary arteries are not reliably characterized due to the degree of artifact. No definite progression from 01/11/2020. Mediastinum/Nodes: Mild enlargement of bilateral axillary lymph nodes which may be related to history of connective tissue disease. No mediastinal adenopathy. Lungs/Pleura: Reportedly patient is COVID positive per the chart. There is no edema, consolidation, effusion, or pneumothorax. Small opacity at the lingula which has a scar-like appearance, also seen on prior. No suspicious pulmonary nodules. Upper Abdomen: Reported separately Musculoskeletal: No acute finding. Review of the MIP images confirms the above findings. IMPRESSION: 1. Very limited CTA due to the extent of motion and streak artifact. Persisting nonacute right-sided pulmonary emboli as seen on 01/11/2020 chest CTA. No clear progression of emboli. DVT is present at the left common femoral vein on prior abdominal CT. 2. COVID positivity without pneumonia. Electronically Signed   By: Monte Fantasia M.D.   On: 02/01/2020 04:53   CT ANGIO CHEST PE W OR WO CONTRAST  Result Date: 01/11/2020 CLINICAL DATA:  Pancreatic cancer. Shortness of breath. Positive D-dimer. EXAM: CT ANGIOGRAPHY CHEST WITH CONTRAST TECHNIQUE: Multidetector CT imaging of the chest was performed using the standard protocol during bolus administration of intravenous contrast. Multiplanar CT image reconstructions  and MIPs were obtained to evaluate the vascular anatomy. CONTRAST:  139mL OMNIPAQUE IOHEXOL 350 MG/ML SOLN COMPARISON:  10/15/2019 FINDINGS: Cardiovascular: Pulmonary arterial opacification is satisfactory. No emboli are seen on the left. There are numerous pulmonary emboli present on the right. No large clot burden. No sign of right heart strain. Mediastinum/Nodes: No mediastinal or hilar mass or lymphadenopathy. Lungs/Pleura: Lungs are well aerated. No infiltrate, collapse or effusion. No evidence of pulmonary metastatic disease. Upper Abdomen: Widespread metastatic disease throughout the liver, probably progressive since September. Musculoskeletal: Negative Bilateral axillary lymphadenopathy, similar to  the study of September. Review of the MIP images confirms the above findings. IMPRESSION: 1. Numerous but small pulmonary emboli on the right. No large clot burden or evidence of right heart strain. 2. Widespread hepatic metastatic disease, probably progressive since September. 3. These results will be called to the ordering clinician or representative by the Radiologist Assistant, and communication documented in the PACS or [employer]. Electronically Signed   By: Nelson Chimes M.D.   On: 01/11/2020 16:35   US Abdomen Complete  Result Date: 01/15/2020 CLINICAL DATA:  Acetic pancreatic cancer to the liver. New hyperbilirubinemia. Rule out biliary obstruction EXAM: ABDOMEN ULTRASOUND COMPLETE COMPARISON:  Ultrasound abdomen 10/30/2019 FINDINGS: Gallbladder: Sludge versus tiny gallstones within the gallbladder lumen. No sonographic Murphy sign noted by sonographer. Common bile duct: Diameter: 6 mm Liver: Innumerable hypodense solid hepatic masses within bilateral hepatic lobes with the largest measuring 2.9 x 2.6 x 3 cm. Remainder of the hepatic demonstrates normal parenchymal echogenicity. Portal vein is patent on color Doppler imaging with normal direction of blood flow towards the liver. IVC: No  abnormality visualized. Pancreas: Not visualized due to gaseous abdomen. Spleen: Size and appearance within normal limits. Right Kidney: Length: 11.9 cm. Echogenicity within normal limits. No mass or hydronephrosis visualized. Left Kidney: Length: 11.8 cm. Echogenicity within normal limits. No mass or hydronephrosis visualized. Abdominal aorta: No aneurysm visualized. Other findings: None. IMPRESSION: 1. Gallbladder sludge versus cholelithiasis with no associated acute cholecystitis or choledocholithiasis. 2. Innumerable hepatic metastases. Electronically Signed   By: Iven Finn M.D.   On: 01/15/2020 17:36   CT ABDOMEN PELVIS W CONTRAST  Result Date: 02/06/2020 CLINICAL DATA:  Abdominal pain and fever. Metastatic pancreatic adenocarcinoma. EXAM: CT ABDOMEN AND PELVIS WITH CONTRAST TECHNIQUE: Multidetector CT imaging of the abdomen and pelvis was performed using the standard protocol following bolus administration of intravenous contrast. CONTRAST:  151mL OMNIPAQUE IOHEXOL 300 MG/ML  SOLN COMPARISON:  CT 5 days ago 02/01/2020 FINDINGS: Lower chest: Stable lingular opacity. Slight increase in bibasilar atelectasis. No pleural fluid. Hepatobiliary: Innumerable hepatic metastasis. No significant change in the short interim. Layering sludge in the gallbladder. Mild gallbladder wall thickening. There is no biliary obstruction or discrete stone. Pancreas: Pancreatic head mass measures 3.4 cm prom distal pancreatic ductal dilatation with pancreatic atrophy unchanged. No adjacent inflammatory change. Spleen: Normal in size without focal abnormality. Adrenals/Urinary Tract: No adrenal nodule. The previous low-density band in the lower left kidney has improved and near completely resolved. There is homogeneous renal enhancement with symmetric excretion on delayed phase imaging. Unremarkable urinary bladder. Stomach/Bowel: Nondistended stomach. Pancreatic mass abuts the distal gastric body without gastric outlet  obstruction. Small amount of enteric contrast is seen involving distal small bowel and throughout the colon. No obstruction. Suggestion of small bowel thickening and enhancement involving short segment in the left lower quadrant, series 2, image 71. There is improved submucosal low-density in the small bowel from prior. Submucosal fatty deposition of the ascending colon is again seen. Slight increase in submucosal fatty deposition involving the transverse and descending colon. Vascular/Lymphatic: Left femoral DVT again seen. No progression from prior. No portal vein thrombosis. Normal caliber abdominal aorta. No acute vascular findings. Enlarged periportal node, largest measuring 14 mm, unchanged. No progressive adenopathy in the short interim. Reproductive: Hysterectomy.  Ovaries not delineated. Other: Small volume abdominopelvic ascites, which has slightly progressed from prior exam. Peritoneal thickening in the right pericolic gutter is again seen. Mild generalized omental and mesenteric edema. Patchy subcutaneous densities in the anterior abdominal wall  with foci of air typical of medication injection sites. Musculoskeletal: No focal bone lesion or acute osseous abnormality. IMPRESSION: 1. Short segment loop of small bowel with wall thickening and mild enhancement in the left lower quadrant may represent focal enteritis. 2. Pancreatic head mass with innumerable hepatic metastasis. Peritoneal thickening in the right pericolic gutter is again seen. No significant change from CT 5 days ago. 3. Small volume abdominopelvic ascites, slightly progressed from prior exam. 4. Improved bandlike hypodensity in the lower left kidney, near completely resolved. 5. Gallbladder sludge. Gallbladder wall thickening or small amount of pericholecystic fluid, likely reactive. 6. Left femoral DVT again seen. Electronically Signed   By: Keith Rake M.D.   On: 02/06/2020 19:48   CT ABDOMEN PELVIS W CONTRAST  Result Date:  02/01/2020 CLINICAL DATA:  Acute right flank pain.  Pancreatic cancer EXAM: CT ABDOMEN AND PELVIS WITH CONTRAST TECHNIQUE: Multidetector CT imaging of the abdomen and pelvis was performed using the standard protocol following bolus administration of intravenous contrast. CONTRAST:  120mL OMNIPAQUE IOHEXOL 350 MG/ML SOLN COMPARISON:  01/24/2020 FINDINGS: Lower chest:  Reported separately Hepatobiliary: Innumerable metastases throughout the liver. Gallbladder sludge or calculi is likely. No evidence of acute cholecystitis. Pancreas: Mass at the pancreatic head with marked proximal ductal dilatation and atrophy. The mass measures approximately 3 cm. No acute superimposed inflammation. Spleen: Unremarkable. Adrenals/Urinary Tract: Negative adrenals. There is a band of low-density in the posterior and lower left kidney. No hydronephrosis. Unremarkable bladder. Stomach/Bowel: Extensive submucosal low-density within small bowel proximal colon which appears fairly well-defined and has the appearance of fat deposition, also seen on prior but more prominent today. No bowel obstruction or clear bowel edema. Gastric outlet is distorted by the pancreatic mass, but no gastric dilatation. The appendix is not well visualized. No pericecal inflammation. Vascular/Lymphatic: DVT in the left common femoral vein which is resolved by the level of the common femoral bifurcation. This finding will be called in conjunction with chest CT findings. Prominent lymph nodes in the deep liver drainage about the pancreatic head, similar and likely metastatic. Reproductive:Hysterectomy. Other: Small volume ascites in the pelvis. Minimal interloop fluid is also seen. No discrete peritoneal nodularity but there is likely mild thickening of the peritoneum the upper right pericolic gutter. Musculoskeletal: No acute abnormalities. IMPRESSION: 1. DVT in the left common femoral vein, nonobstructive. 2. Band of hypoenhancement in the lower left kidney,  contralateral to site of symptoms. Pyelonephritis is considered, please correlate with urinalysis. 3. Known pancreas cancer with extensive liver metastases. 4. Small volume ascites with possible peritoneal thickening, concerning for early peritoneal metastatic disease. 5. Gallbladder sludge or calculi. No evidence of acute cholecystitis. Electronically Signed   By: Monte Fantasia M.D.   On: 02/01/2020 04:47   CT Abdomen Pelvis W Contrast  Result Date: 01/24/2020 CLINICAL DATA:  Abdominal pain for the past 2 days. Constipation. Ongoing chemotherapy for pancreatic cancer. EXAM: CT ABDOMEN AND PELVIS WITH CONTRAST TECHNIQUE: Multidetector CT imaging of the abdomen and pelvis was performed using the standard protocol following bolus administration of intravenous contrast. CONTRAST:  17mL OMNIPAQUE IOHEXOL 300 MG/ML  SOLN COMPARISON:  Abdomen MR dated 01/16/2020. Chest CTA dated 01/11/2020. Abdomen and pelvis CT dated 11/24/2019. FINDINGS: Lower chest: Mildly progressive linear and mass-like atelectasis at the left lung base with no significant change in linear atelectasis or scarring at the right lung base. Hepatobiliary: Large number of masses throughout the liver without significant change since 01/16/2020. Unremarkable gallbladder. Pancreas: No significant change in the mass in  the pancreas at the level of the junction of the pancreatic head and body since 01/16/2020 with associated marked dilatation of the pancreatic duct. Spleen: Unremarkable. Adrenals/Urinary Tract: Adrenal glands are unremarkable. Kidneys are normal, without renal calculi, focal lesion, or hydronephrosis. Bladder is unremarkable. Stomach/Bowel: Minimal diffuse enlargement of the appendix with mild diffuse low density wall thickening without periappendiceal soft tissue stranding. The appendix measures 9 mm in maximum diameter, similar to 11/24/2019. Unremarkable stomach, small bowel and colon. No significant stool in the colon.  Vascular/Lymphatic: No significant vascular findings are present. No enlarged abdominal or pelvic lymph nodes. Reproductive: Status post hysterectomy. No adnexal masses. Other: No abdominal wall hernia or abnormality. No abdominopelvic ascites. Musculoskeletal: L5-S1 degenerative changes. IMPRESSION: 1. No acute abnormality. 2. No significant change in the known pancreatic malignancy with associated marked dilatation of the pancreatic duct. 3. Stable extensive liver metastatic disease. Electronically Signed   By: Claudie Revering M.D.   On: 01/24/2020 10:57   MR 3D Recon At Scanner  Result Date: 01/17/2020 CLINICAL DATA:  Jaundice, history of pancreatic cancer EXAM: MRI ABDOMEN WITHOUT AND WITH CONTRAST (INCLUDING MRCP) TECHNIQUE: Multiplanar multisequence MR imaging of the abdomen was performed both before and after the administration of intravenous contrast. Heavily T2-weighted images of the biliary and pancreatic ducts were obtained, and three-dimensional MRCP images were rendered by post processing. CONTRAST:  51mL GADAVIST GADOBUTROL 1 MMOL/ML IV SOLN COMPARISON:  Multiple exams, including 11/24/2019 FINDINGS: Please note that images for today's exam span 2 different accession numbers, with post-contrast images in 1 record and precontrast images in the other record. Lower chest: Trace left pleural effusion with adjacent atelectasis in the left lower lobe. Trace right pleural effusion. Borderline cardiomegaly. A right lower axillary lymph node measures 0.8 cm in short axis on image 3 of series 3. Hepatobiliary: Extensive metastatic burden throughout all lobes of the liver, substantially increased from 11/24/2019 and fairly similar to 01/11/2020. Sludge in the gallbladder with gallbladder wall thickening. Minimal intrahepatic biliary dilatation with the common hepatic duct at 0.9 cm in diameter, and narrowing of the common bile duct down to about 0.3 cm in diameter in the vicinity of the pancreatic mass due to  extrinsic compression. Expected tapering course of the distal most CBD on image 12 of series 5. Pancreas: Prominently dilated dorsal pancreatic duct dilated side ducts, extending to the level of the mass in the junction of the pancreatic head and body. The mass measures 3.6 by 2.7 cm on image 64 series 7 and abuts the gastric wall, portal vein, and common bile duct along with obstructing the pancreatic duct. By my measurements, this primary mass was previously 3.5 by 2.5 cm on 11/24/2019. Spleen:  Unremarkable Adrenals/Urinary Tract:  Unremarkable Stomach/Bowel: Pancreatic mass abuts the posterior wall of the stomach. Vascular/Lymphatic: Small peripancreatic lymph nodes without overt pathologic adenopathy. Other:  No supplemental non-categorized findings. Musculoskeletal: Degenerative disc disease at L5-S1. IMPRESSION: 1. Prominently dilated dorsal pancreatic duct extending to the level of the mass in the junction of the pancreatic head and body. The mass abuts the gastric wall, portal vein, and common bile duct, leading to mild narrowing of the common bile duct down to about 0.3 cm, with the common hepatic duct mildly prominent 0.9 cm. The mass has mildly enlarged compared to the CT examination of 11/24/2019. 2. Extensive metastatic burden throughout all lobes of the liver, substantially increased from 11/24/2019 and fairly similar to 01/11/2020. 3. Trace left pleural effusion with adjacent atelectasis in the left lower lobe.  Borderline cardiomegaly. 4. Sludge in the gallbladder with gallbladder wall thickening. Electronically Signed   By: Gaylyn RongWalter  Liebkemann M.D.   On: 01/17/2020 08:29   DG Chest Port 1 View  Result Date: 02/01/2020 CLINICAL DATA:  Sepsis, right anterior chest pain, history of pancreatic cancer EXAM: PORTABLE CHEST 1 VIEW COMPARISON:  11/24/2019 FINDINGS: Single frontal view of the chest demonstrates stable left chest wall port. Cardiac silhouette is unremarkable. Lung volumes are diminished,  with no acute airspace disease, effusion, or pneumothorax. No acute bony abnormalities. IMPRESSION: 1. Low lung volumes.  No acute process. Electronically Signed   By: Sharlet SalinaMichael  Brown M.D.   On: 02/01/2020 01:32   MR ABDOMEN MRCP W WO CONTAST  Result Date: 01/17/2020 CLINICAL DATA:  Jaundice, history of pancreatic cancer EXAM: MRI ABDOMEN WITHOUT AND WITH CONTRAST (INCLUDING MRCP) TECHNIQUE: Multiplanar multisequence MR imaging of the abdomen was performed both before and after the administration of intravenous contrast. Heavily T2-weighted images of the biliary and pancreatic ducts were obtained, and three-dimensional MRCP images were rendered by post processing. CONTRAST:  8mL GADAVIST GADOBUTROL 1 MMOL/ML IV SOLN COMPARISON:  Multiple exams, including 11/24/2019 FINDINGS: Please note that images for today's exam span 2 different accession numbers, with post-contrast images in 1 record and precontrast images in the other record. Lower chest: Trace left pleural effusion with adjacent atelectasis in the left lower lobe. Trace right pleural effusion. Borderline cardiomegaly. A right lower axillary lymph node measures 0.8 cm in short axis on image 3 of series 3. Hepatobiliary: Extensive metastatic burden throughout all lobes of the liver, substantially increased from 11/24/2019 and fairly similar to 01/11/2020. Sludge in the gallbladder with gallbladder wall thickening. Minimal intrahepatic biliary dilatation with the common hepatic duct at 0.9 cm in diameter, and narrowing of the common bile duct down to about 0.3 cm in diameter in the vicinity of the pancreatic mass due to extrinsic compression. Expected tapering course of the distal most CBD on image 12 of series 5. Pancreas: Prominently dilated dorsal pancreatic duct dilated side ducts, extending to the level of the mass in the junction of the pancreatic head and body. The mass measures 3.6 by 2.7 cm on image 64 series 7 and abuts the gastric wall, portal  vein, and common bile duct along with obstructing the pancreatic duct. By my measurements, this primary mass was previously 3.5 by 2.5 cm on 11/24/2019. Spleen:  Unremarkable Adrenals/Urinary Tract:  Unremarkable Stomach/Bowel: Pancreatic mass abuts the posterior wall of the stomach. Vascular/Lymphatic: Small peripancreatic lymph nodes without overt pathologic adenopathy. Other:  No supplemental non-categorized findings. Musculoskeletal: Degenerative disc disease at L5-S1. IMPRESSION: 1. Prominently dilated dorsal pancreatic duct extending to the level of the mass in the junction of the pancreatic head and body. The mass abuts the gastric wall, portal vein, and common bile duct, leading to mild narrowing of the common bile duct down to about 0.3 cm, with the common hepatic duct mildly prominent 0.9 cm. The mass has mildly enlarged compared to the CT examination of 11/24/2019. 2. Extensive metastatic burden throughout all lobes of the liver, substantially increased from 11/24/2019 and fairly similar to 01/11/2020. 3. Trace left pleural effusion with adjacent atelectasis in the left lower lobe. Borderline cardiomegaly. 4. Sludge in the gallbladder with gallbladder wall thickening. Electronically Signed   By: Gaylyn RongWalter  Liebkemann M.D.   On: 01/17/2020 08:29    Subjective: Perhaps had another fall last night. RN staff report finding her kneeling over though she states she leaned over to pick something  up this morning when getting back to bed and RN staff found her. She denies any trauma, LOC. No shortness of breath or chest pain. HR better controlled. No bleeding.   Discharge Exam: Vitals:   02/07/20 0700 02/07/20 0800  BP:  127/85  Pulse:    Resp: (!) 21 17  Temp:    SpO2:     General: Pt is alert, awake, not in acute distress Cardiovascular: RRR, S1/S2 +, no rubs, no gallops Respiratory: CTA bilaterally, no wheezing, no rhonchi Abdominal: Soft, diffusely mildly tender with +BS Extremities: No pitting  edema, no cyanosis  Labs: BNP (last 3 results) No results for input(s): BNP in the last 8760 hours. Basic Metabolic Panel: Recent Labs  Lab 02/02/20 0703 02/03/20 0500 02/04/20 1201 02/05/20 0513 02/05/20 0516 02/06/20 0500 02/06/20 1700 02/07/20 0528  NA 137   < > 136 137  --  135 135 132*  K 4.1   < > 2.8* 3.6  --  3.4* 3.3* 3.3*  CL 106   < > 104 107  --  100 100 99  CO2 21*   < > 20* 24  --  24 25 23   GLUCOSE 89   < > 93 76  --  165* 96 135*  BUN 17   < > 7 5*  --  10 10 9   CREATININE 0.68   < > 0.66 0.62  --  0.58 0.50 0.53  CALCIUM 8.5*   < > 8.5* 8.1*  --  8.4* 8.4* 8.3*  MG 1.9  --  1.6*  --  2.3 1.8 2.1 1.8  PHOS 3.4  --   --  2.2*  --  1.3* 1.3* 1.7*   < > = values in this interval not displayed.   Liver Function Tests: Recent Labs  Lab 02/03/20 0500 02/04/20 1201 02/05/20 0513 02/06/20 0500 02/07/20 0528  AST 85* 242* 389* 248* 183*  ALT 54* 128* 188* 178* 155*  ALKPHOS 232* 329* 306* 306* 299*  BILITOT 1.4* 2.1* 2.4* 2.3* 2.5*  PROT 6.2* 7.2 6.7 6.9 6.6  ALBUMIN 1.8* 2.3* 2.0* 2.0* 2.0*   Recent Labs  Lab 02/01/20 0108  LIPASE 20   No results for input(s): AMMONIA in the last 168 hours. CBC: Recent Labs  Lab 02/02/20 0703 02/03/20 0500 02/04/20 1201 02/05/20 0513 02/06/20 0500  WBC 8.9 3.7* 7.7 9.7 13.3*  NEUTROABS 7.4 2.4 5.8 7.7 9.6*  HGB 9.8* 8.5* 8.4* 8.3* 8.5*  HCT 31.3* 26.0* 25.7* 25.4* 26.2*  MCV 96.0 92.2 91.5 91.7 92.6  PLT 109* 85* 83* 83* 90*   Cardiac Enzymes: No results for input(s): CKTOTAL, CKMB, CKMBINDEX, TROPONINI in the last 168 hours. BNP: Invalid input(s): POCBNP CBG: Recent Labs  Lab 02/06/20 1637 02/06/20 1814 02/07/20 0101 02/07/20 0601 02/07/20 1127  GLUCAP 87 86 108* 137* 128*   D-Dimer No results for input(s): DDIMER in the last 72 hours. Hgb A1c No results for input(s): HGBA1C in the last 72 hours. Lipid Profile Recent Labs    02/05/20 0525  TRIG 47   Thyroid function studies Recent Labs     02/06/20 0503  TSH 1.347   Anemia work up No results for input(s): VITAMINB12, FOLATE, FERRITIN, TIBC, IRON, RETICCTPCT in the last 72 hours. Urinalysis    Component Value Date/Time   COLORURINE AMBER (A) 02/01/2020 0200   APPEARANCEUR CLEAR 02/01/2020 0200   LABSPEC 1.016 02/01/2020 0200   PHURINE 9.0 (H) 02/01/2020 0200   GLUCOSEU NEGATIVE 02/01/2020 0200  HGBUR NEGATIVE 02/01/2020 0200   BILIRUBINUR NEGATIVE 02/01/2020 0200   KETONESUR NEGATIVE 02/01/2020 0200   PROTEINUR NEGATIVE 02/01/2020 0200   NITRITE NEGATIVE 02/01/2020 0200   Ellerbe 02/01/2020 0200    Microbiology Recent Results (from the past 240 hour(s))  Blood Culture (routine x 2)     Status: None   Collection Time: 02/01/20  1:08 AM   Specimen: BLOOD  Result Value Ref Range Status   Specimen Description   Final    BLOOD BLOOD RIGHT FOREARM Performed at [facility] [address], [place], [place] [address]    Special Requests   Final    BOTTLES DRAWN AEROBIC AND ANAEROBIC Blood Culture results may not be optimal due to an inadequate volume of blood received in culture bottles Performed at [facility] [address], [place], [place] [address]    Culture   Final    NO GROWTH 5 DAYS Performed at [facility], [place] [address], [place], [place] [address]    Report Status 02/06/2020 FINAL  Final  Blood Culture (routine x 2)     Status: None   Collection Time: 02/01/20  1:32 AM   Specimen: BLOOD  Result Value Ref Range Status   Specimen Description   Final    BLOOD RIGHT ANTECUBITAL Performed at [facility] [address] [place], [place] [address]    Special Requests   Final    BOTTLES DRAWN AEROBIC AND ANAEROBIC Blood Culture adequate volume Performed at [facility] [address] [place], [place] [address]    Culture   Final    NO GROWTH 5 DAYS Performed at [facility], [place]  [address] [place], [place] [address]    Report Status 02/06/2020 FINAL  Final  Resp Panel by RT-PCR (Flu A&B, Covid) Nasopharyngeal Swab     Status: Abnormal   Collection Time: 02/01/20  1:32 AM   Specimen: Nasopharyngeal Swab; Nasopharyngeal(NP) swabs in vial transport medium  Result Value Ref Range Status   SARS Coronavirus 2 by RT PCR POSITIVE (A) NEGATIVE Final    Comment: RESULT CALLED TO, READ BACK BY AND VERIFIED WITH: HOLLY, RN @ 0301 ON 02/01/20 C VARNER (NOTE) SARS-CoV-2 target nucleic acids are DETECTED.  The SARS-CoV-2 RNA is generally detectable in upper respiratory specimens during the acute phase of infection. Positive results are indicative of the presence of the identified virus, but do not rule out bacterial infection or co-infection with other pathogens not detected by the test. Clinical correlation with patient history and other diagnostic information is necessary to determine patient infection status. The expected result is Negative.  Fact Sheet for Patients: EntrepreneurPulse.com.au  Fact Sheet for Healthcare Providers: IncredibleEmployment.be  This test is not yet approved or cleared by the [place] FDA and  has been authorized for detection and/or diagnosis of SARS-CoV-2 by FDA under an Emergency Use Authorization (EUA).  This EUA will remain in effect (meaning this test can  be used) for the duration of  the COVID-19 declaration under Section 564(b)(1) of the Act, 21 U.S.C. section 360bbb-3(b)(1), unless the authorization is terminated or revoked sooner.     Influenza A by PCR NEGATIVE NEGATIVE Final   Influenza B by PCR NEGATIVE NEGATIVE Final    Comment: (NOTE) The Xpert Xpress SARS-CoV-2/FLU/RSV plus assay is intended as an aid in the diagnosis of influenza from Nasopharyngeal swab specimens and should not be used as a sole basis for treatment.  Nasal washings and aspirates are unacceptable for Xpert Xpress  SARS-CoV-2/FLU/RSV testing.  Fact Sheet for Patients: EntrepreneurPulse.com.au  Fact Sheet for Healthcare Providers: IncredibleEmployment.be  This test is not yet approved or cleared by the [place] FDA and has been authorized for detection and/or diagnosis of SARS-CoV-2 by FDA under an Emergency Use Authorization (EUA). This EUA will remain in effect (meaning this test can be used) for the duration of the COVID-19 declaration under Section 564(b)(1) of the Act, 21 U.S.C. section 360bbb-3(b)(1), unless the authorization is terminated or revoked.  Performed at [facility], [place] [address], [place], [place] [address]   Urine culture     Status: None   Collection Time: 02/01/20  2:00 AM   Specimen: In/Out Cath Urine  Result Value Ref Range Status   Specimen Description   Final    IN/OUT CATH URINE Performed at [facility] [address] [place], [place] [address]    Special Requests   Final    NONE Performed at [facility], [place] [address], [place], [place] [address]    Culture   Final    NO GROWTH Performed at [facility], [place] [address], [place], [place] [address]    Report Status 02/02/2020 FINAL  Final  MRSA PCR Screening     Status: None   Collection Time: 02/01/20  9:27 AM   Specimen: Nasopharyngeal  Result Value Ref Range Status   MRSA by PCR NEGATIVE NEGATIVE Final    Comment:        The GeneXpert MRSA Assay (FDA approved for NASAL specimens only), is one component of a comprehensive MRSA colonization surveillance program. It is not intended to diagnose MRSA infection nor to guide or monitor treatment for MRSA infections. Performed at [facility], [place] [address], [place], [place] [address]     Time coordinating discharge: Approximately 40 minutes  Patrecia Pour, MD  [facility] 02/07/2020, 1:39 PM

## 2020-02-07 NOTE — Progress Notes (Signed)
   02/07/20 0030  What Happened  Was fall witnessed? No  Was patient injured? No  Patient found on floor  Found by Staff-comment  Stated prior activity to/from bed, chair, or stretcher  Follow Up  MD notified Russ Halo NP  Time MD notified 2348  Family notified Yes - comment  Time family notified 2330  Additional tests No  Progress note created (see row info) Yes  Adult Fall Risk Assessment  Risk Factor Category (scoring not indicated) Fall has occurred during this admission (document High fall risk);High fall risk per protocol (document High fall risk)  Age 61  Fall History: Fall within 6 months prior to admission 5  Elimination; Bowel and/or Urine Incontinence 0  Elimination; Bowel and/or Urine Urgency/Frequency 2  Medications: includes PCA/Opiates, Anti-convulsants, Anti-hypertensives, Diuretics, Hypnotics, Laxatives, Sedatives, and Psychotropics 3  Patient Care Equipment 2  Mobility-Assistance 2  Mobility-Gait 2  Mobility-Sensory Deficit 0  Altered awareness of immediate physical environment 0  Impulsiveness 0  Lack of understanding of one's physical/cognitive limitations 4  Total Score 20  Patient Fall Risk Level High fall risk  Adult Fall Risk Interventions  Required Bundle Interventions *See Row Information* High fall risk - low, moderate, and high requirements implemented  Additional Interventions Room near nurses station;Use of appropriate toileting equipment (bedpan, BSC, etc.)  Screening for Fall Injury Risk (To be completed on HIGH fall risk patients) - Assessing Need for Low Bed  Risk For Fall Injury- Low Bed Criteria Previous fall this admission  Will Implement Low Bed and Floor Mats Yes  Screening for Fall Injury Risk (To be completed on HIGH fall risk patients who do not meet crieteria for Low Bed) - Assessing Need for Floor Mats Only  Risk For Fall Injury- Criteria for Floor Mats Noncompliant with safety precautions  Will Implement Floor Mats Yes  Pain  Assessment  Pain Scale 0-10  Pain Score Asleep  PCA/Epidural/Spinal Assessment  Respiratory Pattern Regular;Unlabored  Neurological  Neuro (WDL) X  Level of Consciousness Alert  Orientation Level Oriented X4  Cognition Poor safety awareness  Speech Clear  Pupil Assessment  No  Neuro Symptoms Forgetful;Tremors  Glasgow Coma Scale  Eye Opening 4  Best Verbal Response (NON-intubated) 5  Best Motor Response 6  Glasgow Coma Scale Score 15  Musculoskeletal  Musculoskeletal (WDL) X  Assistive Device BSC  Generalized Weakness Yes  Integumentary  Integumentary (WDL) X  Skin Color Jaundice  Skin Condition Dry  Pain Assessment  Result of Injury No  Pt bed alarm was on and upon getting to the room, pt was noted kneeling on the bed side floor mat and was pulling herself up to  get  to the [facility]. Assess, assisted up to the [facility] with two person assist. Pt denies any injury and pain. Pt spouse notified. Md made aware and new order given.  Pt oriented to the use of call bell. Verbalize understanding.

## 2020-02-07 NOTE — Plan of Care (Signed)

## 2020-02-07 NOTE — TOC Transition Note (Signed)
Transition of Care [facility]) - CM/SW Discharge Note   Patient Details  Name: Cynthia Frye MRN: [id number] Date of Birth: 1960/06/23  [facility] CM/SW Contact:  Ross Ludwig, LCSW Phone Number: 02/07/2020, 12:20 PM   Clinical Narrative:     Patient will be discharging back home today.  She is currently receiving TPN services from [facility].  CSW notified Carolynn Sayers that patient will be discharging today.  CSW to sign off, patient will be discharging back home with her husband.  Final next level of care: Home/Self Care Barriers to Discharge: Barriers Resolved   Patient Goals and CMS Choice Patient states their goals for this hospitalization and ongoing recovery are:: To return back home with husband. CMS Medicare.gov Compare Post Acute Care list provided to:: Patient Choice offered to / list presented to : Patient  Discharge Placement  Home with husband.                Name of family member notified: Jules Vidovich [phone]  [phone] Patient and family notified of of transfer: 02/07/20  Discharge Plan and Services                DME Arranged: IV pump/equipment DME Agency: Other - Comment ([facility]) Date DME Agency Contacted: 02/07/20 Time DME Agency Contacted: 1218 Representative spoke with at DME Agency: Carolynn Sayers [facility] Arranged: RN [place] Agency: Other - See comment ([facility]) Date HH Agency Contacted: 02/07/20 Time [place]: 1219 Representative spoke with at [place]: [place] ([place]) Interventions     Readmission Risk Interventions Readmission Risk Prevention Plan 12/11/2019 11/27/2019  Transportation Screening - Complete  HRI or [place] - Complete  Social Work Consult for [place] Planning/Counseling - Complete  Palliative Care Screening - Not Applicable  Medication Review Press photographer) - Complete  PCP or Specialist appointment within 3-5 days of  discharge Complete -  [place] or [place] Complete -  SW Recovery Care/Counseling Consult Complete -  Palliative Care Screening Not Applicable -  [place] Not Applicable -  Some recent data might be hidden

## 2020-02-07 NOTE — Progress Notes (Signed)
Per [name], MD provided order for pt to go home with port accessed.  Cynthia Frye is a witness.

## 2020-02-07 NOTE — Progress Notes (Signed)
Post fall vs 98.6, 146/106, 109, 19,  100/RA,

## 2020-02-07 NOTE — Progress Notes (Addendum)
Pt discharge to home instructions reviewed with patient and husband. IV team notified to CAP-off PAC line. Pt will need access for home TPN this evening. Prearranged with [facility] per SW assist. Pt and husband acknowledged understanding of process. IV team/VAST team member will return to cap and flush PAC per hospital policy and protocol. SRP, RN

## 2020-02-08 ENCOUNTER — Inpatient Hospital Stay (HOSPITAL_BASED_OUTPATIENT_CLINIC_OR_DEPARTMENT_OTHER): Payer: BC Managed Care – PPO | Admitting: Hematology

## 2020-02-08 ENCOUNTER — Telehealth: Payer: Self-pay | Admitting: *Deleted

## 2020-02-08 DIAGNOSIS — I82422 Acute embolism and thrombosis of left iliac vein: Secondary | ICD-10-CM

## 2020-02-08 DIAGNOSIS — C250 Malignant neoplasm of head of pancreas: Secondary | ICD-10-CM | POA: Diagnosis not present

## 2020-02-08 DIAGNOSIS — C787 Secondary malignant neoplasm of liver and intrahepatic bile duct: Secondary | ICD-10-CM | POA: Diagnosis not present

## 2020-02-08 DIAGNOSIS — G4700 Insomnia, unspecified: Secondary | ICD-10-CM

## 2020-02-08 DIAGNOSIS — R112 Nausea with vomiting, unspecified: Secondary | ICD-10-CM

## 2020-02-08 DIAGNOSIS — I10 Essential (primary) hypertension: Secondary | ICD-10-CM

## 2020-02-08 DIAGNOSIS — Z7901 Long term (current) use of anticoagulants: Secondary | ICD-10-CM

## 2020-02-08 DIAGNOSIS — G43909 Migraine, unspecified, not intractable, without status migrainosus: Secondary | ICD-10-CM

## 2020-02-08 DIAGNOSIS — F419 Anxiety disorder, unspecified: Secondary | ICD-10-CM

## 2020-02-08 DIAGNOSIS — R197 Diarrhea, unspecified: Secondary | ICD-10-CM | POA: Diagnosis not present

## 2020-02-08 DIAGNOSIS — M329 Systemic lupus erythematosus, unspecified: Secondary | ICD-10-CM

## 2020-02-08 DIAGNOSIS — Z8616 Personal history of COVID-19: Secondary | ICD-10-CM

## 2020-02-08 MED ORDER — DICYCLOMINE HCL 10 MG PO CAPS: 10.0000 mg | ORAL_CAPSULE | Freq: Four times a day (QID) | ORAL | 0 refills | Status: DC | PRN

## 2020-02-08 MED ORDER — DIPHENOXYLATE-ATROPINE 2.5-0.025 MG PO TABS: 2.0000 | ORAL_TABLET | Freq: Four times a day (QID) | ORAL | 0 refills | Status: DC | PRN

## 2020-02-08 NOTE — Telephone Encounter (Signed)
Patt left a message requesting a phone visit with Dr Burr Medico today. She is having diarrhea and throwing up, having a lot of pain.  Dr Burr Medico will do a phone visit at 1100.

## 2020-02-08 NOTE — Progress Notes (Signed)
Cynthia Frye   Telephone:([phone]) [phone] Fax:(336) [phone]   Clinic Follow up Note   Patient Care Team: Josetta Huddle, MD as PCP - General (Internal Medicine) Jonnie Finner, RN as Oncology Nurse Navigator Truitt Merle, MD as Consulting Physician (Oncology) Arta Silence, MD as Consulting Physician (Gastroenterology) [name], MD as Consulting Physician (General Surgery)   I connected with Cynthia Frye on 02/08/2020 at 11:00 AM EST by video enabled telemedicine visit and verified that I am speaking with the correct person using two identifiers.  I discussed the limitations, risks, security and privacy concerns of performing an evaluation and management service by telephone and the availability of in person appointments. I also discussed with the patient that there may be a patient responsible charge related to this service. The patient expressed understanding and agreed to proceed.   Other persons participating in the visit and their role in the encounter:  Husband  Patient's location:  Her home  Provider's location:  My office   CHIEF COMPLAINT: F/u of Pancreatic Cancer  SUMMARY OF ONCOLOGIC HISTORY: Oncology History Overview Note  Cancer Staging Pancreatic cancer [facility]) Staging form: Exocrine Pancreas, AJCC 8th Edition - Clinical stage from 10/18/2019: Stage IV (cT3, cN1, cM1) - Signed by Truitt Merle, MD on 10/18/2019    Pancreatic cancer ([place])  09/19/2019 Imaging   CT AP w contrast 09/19/19  IMPRESSION: 1. Findings are highly concerning for probable primary pancreatic adenocarcinoma in the anterior aspect of the pancreatic head. Several prominent borderline enlarged lymph nodes are noted in the hepatoduodenal nodal station, and there are multiple indeterminate liver lesions which are highly concerning for probable hepatic metastases. Further evaluation with nonemergent abdominal MRI with and without IV gadolinium with MRCP is recommended in the  near future to better evaluate these findings.   09/25/2019 Procedure   Upper EUS by Dr Paulita Fujita  IMPRESSION -There was no evidence of significant pathology in the left lobe of the liver.  -A few lymph nodes were visualized and measures in the peripancreatic region and porta hepata region.  -Hyperchoic material consistent with sludge was visualized endosonographically in the gallbladder.  -There was no sign of significant pathology in the common bile duct.  -A mass was identified in the pancreatic head. If biopsy results show adenocarcinoma, it would be staged T3N1Mx by endosonographic criteria. Fine needle aspiration performed.    09/25/2019 Initial Biopsy   FINAL MICROSCOPIC DIAGNOSIS: Fine needle aspirate, Pancreas;  MALIGNANT CELLS PRESENT CONSISTENT WITH ADENOCARCINOMA.    10/02/2019 Initial Diagnosis   Pancreatic cancer ([place])   10/11/2019 Procedure   PAC placement y Dr Barry Dienes    10/15/2019 Imaging   CT Chest  IMPRESSION: 1. Small anterior left pneumothorax with dependent atelectasis in the left lower lobe. 2. Increased number of bilateral axillary and subpectoral lymph nodes with mild lymphadenopathy in the left axilla. While this would be an atypical presentation for metastatic pancreatic cancer, this possibility is not excluded 3. Main duct dilatation in the pancreas, better assessed on abdomen CT 09/19/2019.   10/16/2019 Imaging   MRI Abdomen  IMPRESSION: 1. Substantially motion degraded scan. 2. Probable persistent small anterior left lung base pneumothorax, better seen on chest CT from 1 day prior. 3. Poorly marginated hypoenhancing 3.7 x 2.9 cm pancreatic head mass, which appears to invade the anterior peripancreatic fat, compatible with known pancreatic adenocarcinoma. Diffuse irregular dilatation of the main pancreatic duct in the pancreatic body and tail. Mild narrowing of the main portal vein by the mass. Abdominal  vasculature remains patent and otherwise  uninvolved. 4. Numerous (greater than 10) small liver masses scattered throughout the liver, largest 1.0 cm, which appear to demonstrate targetoid enhancement on the limited motion degraded postcontrast sequences, compatible with liver metastases. 5. Mild porta hepatis adenopathy, suspicious for metastatic disease.   10/18/2019 Cancer Staging   Staging form: Exocrine Pancreas, AJCC 8th Edition - Clinical stage from 10/18/2019: Stage IV (cT3, cN1, cM1) - Signed by Truitt Merle, MD on 10/18/2019   10/24/2019 - 11/21/2019 Chemotherapy   First-line FOLFIRINOX q2weeks starting 10/24/19. C2 postponed due to N/V/D and dose reduced 20-30%. Given poor tolerance, stopped after 2 cycles.    10/30/2019 Pathology Results   FINAL MICROSCOPIC DIAGNOSIS:   A. LIVER, LESION, BIOPSY:  -  Metastatic carcinoma  -  See comment   COMMENT:   By immunohistochemistry, the neoplastic cells are positive for  cytokeratin 7 and GATA3 with patchy nonspecific staining for PAX 8 but  negative for TTF-1, CDX2 and cytokeratin 20.  Overall, the findings are  consistent with metastasis of the patient's known breast carcinoma.  Prognostic panel (ER, PR, Her-2) is pending and will be reported in an  addendum.    ADDENDUM:   Dr. Laurence Ferrari notified us (November 01, 2019) that the patient was also  being worked up for a pancreatic mass.  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  In addition, ER and PR  are negative.  Gata-3 can be expressed in the pancreatic  adenocarcinomas; therefore, pancreatobiliary primary remains in the  differential.    11/02/2019 Genetic Testing   Negative genetic testing: no pathogenic variants detected in [facility] Common Hereditary Cancers Panel.  The report date is November 02, 2019.   The Common Hereditary Cancers Panel offered by [facility] includes sequencing and/or deletion duplication testing of the following 48 genes: APC, ATM, AXIN2, BARD1, BMPR1A, BRCA1,  BRCA2, BRIP1, CDH1, CDK4, CDKN2A (p14ARF), CDKN2A (p16INK4a), CHEK2, CTNNA1, DICER1, EPCAM (Deletion/duplication testing only), GREM1 (promoter region deletion/duplication testing only), KIT, MEN1, MLH1, MSH2, MSH3, MSH6, MUTYH, NBN, NF1, NHTL1, PALB2, PDGFRA, PMS2, POLD1, POLE, PTEN, RAD50, RAD51C, RAD51D, RNF43, SDHB, SDHC, SDHD, SMAD4, SMARCA4. STK11, TP53, TSC1, TSC2, and VHL.  The following genes were evaluated for sequence changes only: SDHA and HOXB13 c.251G>A variant only.   11/04/2019 Imaging   CT AP  IMPRESSION: 1. Circumferential bowel wall thickening with adjacent fat stranding throughout the colon, most predominant in the transverse colon. This is consistent with colitis, which may be infectious or inflammatory in etiology. 2. Ill-defined pancreatic head mass consistent with known malignancy, similar to mildly increased in comparison to prior CT. There are innumerable hypodense masses throughout the liver, increased in conspicuity in comparison to prior CT. Findings are worrisome for worsening metastatic disease. 3. Filling defect in the LEFT internal iliac vein, likely a thrombus with differential considerations including mixing artifact.   11/24/2019 Imaging   CT AP  IMPRESSION: Pancreatic head mass again noted compatible with patient's given history of pancreatic cancer.   Numerous metastases throughout the liver, enlarging since prior study.     12/10/2019 Procedure   Upper endoscopy by Dr Michail Sermon  IMPRESSION - Z-line regular, 42 cm from the incisors. - Esophageal ulcer with stigmata of recent bleeding. - Gastritis. Biopsied. - Mucosal changes in the duodenum. Biopsied. - Normal second portion of the duodenum. - Gastric and duodenal thickening concerning for inflammation from mets or due to mets from known pancreatic cancer.   FINAL MICROSCOPIC DIAGNOSIS:  A. DUODENUM, BIOPSY:  - Benign duodenal mucosa.  - No dysplasia or malignancy.   B. STOMACH,  BIOPSY:  - Antral mucosa with mild chronic active inflammation.  - No intestinal metaplasia, dysplasia or carcinoma.   12/28/2019 -  Chemotherapy   Change her to second-line Gemcitabine 2 weeks on/1 week off starting 12/28/19.    01/16/2020 Imaging   MRI abdomen  IMPRESSION: 1. Prominently dilated dorsal pancreatic duct extending to the level of the mass in the junction of the pancreatic head and body. The mass abuts the gastric wall, portal vein, and common bile duct, leading to mild narrowing of the common bile duct down to about 0.3 cm, with the common hepatic duct mildly prominent 0.9 cm. The mass has mildly enlarged compared to the CT examination of 11/24/2019. 2. Extensive metastatic burden throughout all lobes of the liver, substantially increased from 11/24/2019 and fairly similar to 01/11/2020. 3. Trace left pleural effusion with adjacent atelectasis in the left lower lobe. Borderline cardiomegaly. 4. Sludge in the gallbladder with gallbladder wall thickening.   01/24/2020 Imaging   CT AP  IMPRESSION: 1. No acute abnormality. 2. No significant change in the known pancreatic malignancy with associated marked dilatation of the pancreatic duct. 3. Stable extensive liver metastatic disease.      CURRENT THERAPY:  Change her to second-line Gemcitabine 2 weeks on/1 week off starting 12/28/19.   INTERVAL HISTORY:  LAGRETTA LOSEKE is here for a follow up. They identified themselves by face to face video. She notes since leaving the hospital yesterday she has not had injury from fall in hospital. She notes she has felt weak, nauseous and diarrhea and feels she should not have left home. She has been taking kaopectate. She is not sure if she has lomotil. She notes in the hospital she was told not to take Imodium. She notes she did not have diarrhea in the hospital. She has had loose watery stool, no blood. She notes she can keep water intake down. She notes cramps and abdominal  pain. She notes she recently took 2 tabs of oxycodone which has not helped her. She notes her pain is in upper and lower abdomen. She notes she is not eating much currently. She is using TPN at night which makes her full to where she does not want to eat.    REVIEW OF SYSTEMS:   Constitutional: Denies fevers, chills or abnormal weight loss Eyes: Denies blurriness of vision Ears, nose, mouth, throat, and face: Denies mucositis or sore throat Respiratory: Denies cough, dyspnea or wheezes Cardiovascular: Denies palpitation, chest discomfort or lower extremity swelling Gastrointestinal:  (+) Nausea and diarrhea (+) upper and lower abdominal pain  Skin: Denies abnormal skin rashes Lymphatics: Denies new lymphadenopathy or easy bruising Neurological:Denies numbness, tingling or new weaknesses Behavioral/Psych: Mood is stable, no new changes  All other systems were reviewed with the patient and are negative.  MEDICAL HISTORY:  Past Medical History:  Diagnosis Date  . Allergies    peanuts, corn, beans, red grapefruit, naval oranges  . Asthma    allergy shots and medication  . Cancer [facility])    pancreatic  . Collagen vascular disease ([place])   . DDD (degenerative disc disease), lumbar   . Eustachian tube dysfunction 12/2012   rhinitis, vertigo- Dr. Wilburn Cornelia, ENT   . Fibroids   . Heart murmur    Echo 1/18: EF 55-60, no RWMA, normal diastolic function, trivial AI, PASP 32  . History of cardiac catheterization  LHC 3/18: normal coronary arteries.   . History of nuclear stress test    ETT-Myoview 2/18: EF 62, + ECG response; apical and distal septal ischemia; intermediate risk.  Marland Kitchen Hypertension   . Iron deficiency anemia   . Lupus [facility]) 2011   Dr. Trudie Reed  . Migraine headache    trial of generic maxalt 10 mg, January 2021  . Obesity   . Seasonal allergies   . Seizure in childhood [facility])    as a child no treatment none x 30 years    SURGICAL HISTORY: Past Surgical History:  Procedure  Laterality Date  . ABDOMINAL HYSTERECTOMY    . BACK SURGERY  2009  . BIOPSY  12/10/2019   Procedure: BIOPSY;  Surgeon: Wilford Corner, MD;  Location: [facility] ENDOSCOPY;  Service: Endoscopy;;  . COLONOSCOPY WITH PROPOFOL N/A 04/11/2012   Procedure: COLONOSCOPY WITH PROPOFOL;  Surgeon: Garlan Fair, MD;  Location: [facility] ENDOSCOPY;  Service: Endoscopy;  Laterality: N/A;  . ESOPHAGOGASTRODUODENOSCOPY (EGD) WITH PROPOFOL N/A 12/10/2019   Procedure: ESOPHAGOGASTRODUODENOSCOPY (EGD) WITH PROPOFOL;  Surgeon: Wilford Corner, MD;  Location: [facility];  Service: Endoscopy;  Laterality: N/A;  . HERNIA REPAIR     as child unbilical  . E1-D partial discectomy and laminectomy     Dr. Christella Noa  . LEFT HEART CATH AND CORONARY ANGIOGRAPHY N/A 04/09/2016   Procedure: Left Heart Cath and Coronary Angiography;  Surgeon: Nelva Bush, MD;  Location: [place] CV LAB;  Service: Cardiovascular;  Laterality: N/A;  . MOUTH SURGERY    . PORTACATH PLACEMENT N/A 10/11/2019   Procedure: INSERTION PORT-A-CATH LEFT SUBCLAVIAN;  Surgeon: [name], MD;  Location: [place];  Service: General;  Laterality: N/A;    I have reviewed the social history and family history with the patient and they are unchanged from previous note.  ALLERGIES:  is allergic to cinnamon, peanut-containing drug products, prednisone, corn oil, corn-containing products, and other.  MEDICATIONS:  Current Outpatient Medications  Medication Sig Dispense Refill  . dicyclomine (BENTYL) 10 MG capsule Take 1 capsule (10 mg total) by mouth every 6 (six) hours as needed for spasms. 30 capsule 0  . Cholecalciferol (VITAMIN D) 2000 units CAPS Take 2,000 Units by mouth daily.     Marland Kitchen dexamethasone (DECADRON) 4 MG tablet Take 1 tablet (4 mg total) by mouth daily. Take 1 tablet once daily for 3 to 5 days after chemo 20 tablet 0  . diclofenac (VOLTAREN) 75 MG EC tablet Take 1 tablet (75 mg total) by mouth 2 (two) times daily as needed.  (Patient taking differently: Take 75 mg by mouth 2 (two) times daily.)    . Dietary Management Product (RHEUMATE) CAPS Take 1 capsule by mouth daily.     . diphenoxylate-atropine (LOMOTIL) 2.5-0.025 MG tablet Take 2 tablets by mouth 4 (four) times daily as needed for diarrhea or loose stools (use 2nd, if Imodium doesn't work. Max dose: 8 tablets/day.). 45 tablet 0  . dronabinol (MARINOL) 2.5 MG capsule Take 1 capsule (2.5 mg total) by mouth 2 (two) times daily before a meal. (Patient taking differently: Take 2.5 mg by mouth 2 (two) times daily as needed (nausea).) 30 capsule 0  . enoxaparin (LOVENOX) 80 MG/0.8ML injection Inject 0.8 mLs (80 mg total) into the skin every 12 (twelve) hours. 48 mL 2  . ferrous sulfate 325 (65 FE) MG tablet Take 325 mg by mouth daily with breakfast.    . fluticasone (FLONASE) 50 MCG/ACT nasal spray Place 2 sprays into both  nostrils daily. (Patient taking differently: Place 2 sprays into both nostrils daily as needed for allergies.) 16 g 12  . hydroxychloroquine (PLAQUENIL) 200 MG tablet Take 400 mg by mouth daily.   5  . lipase/protease/amylase 24000-76000 units CPEP Take 1 capsule (24,000 Units total) by mouth 3 (three) times daily with meals. 270 capsule 0  . loperamide (IMODIUM) 2 MG capsule Take 2 capsules (4 mg total) by mouth 2 (two) times daily as needed for diarrhea or loose stools (Use 1st). 30 capsule 0  . LORazepam (ATIVAN) 0.5 MG tablet Take 1 tablet (0.5 mg total) by mouth every 8 (eight) hours as needed for anxiety (and anticiaptory n/v. DO NOT TAKE WITH XANAX). 30 tablet 0  . losartan-hydrochlorothiazide (HYZAAR) 50-12.5 MG tablet Take 1 tablet by mouth daily.    . metoprolol succinate (TOPROL-XL) 50 MG 24 hr tablet Take 50 mg by mouth daily.     . mirtazapine (REMERON) 7.5 MG tablet TAKE 1 TABLET BY MOUTH AT BEDTIME. 90 tablet 1  . OLANZapine (ZYPREXA) 7.5 MG tablet Take 1 tablet (7.5 mg total) by mouth at bedtime. 30 tablet 0  . ondansetron (ZOFRAN) 8 MG  tablet Take 1 tablet (8 mg total) by mouth every 8 (eight) hours as needed for nausea or vomiting. 30 tablet 3  . oxyCODONE (OXY IR/ROXICODONE) 5 MG immediate release tablet Take 1 tablet (5 mg total) by mouth every 6 (six) hours as needed for severe pain. 60 tablet 0  . senna-docusate (SENOKOT-S) 8.6-50 MG tablet Take 1 tablet by mouth 2 (two) times daily. (Patient taking differently: Take 1 tablet by mouth 2 (two) times daily as needed for mild constipation.) 60 tablet 1  . sterile water SOLN with amino acids 10 % SOLN 1.3 g/kg, dextrose 70 % SOLN 20 % Inject into the vein continuous. Pt on a 12 hour schedule. Last bag 01-31-20. Per husband. [facility].    . sucralfate (CARAFATE) 1 GM/10ML suspension Take 10 mLs (1 g total) by mouth 4 (four) times daily -  with meals and at bedtime. 420 mL 3  . topiramate (TOPAMAX) 50 MG tablet Take 1 tablet (50 mg total) by mouth at bedtime. (Patient taking differently: Take 50 mg by mouth daily as needed (migraines).)     No current facility-administered medications for this visit.    PHYSICAL EXAMINATION: ECOG PERFORMANCE STATUS: 3 - Symptomatic, >50% confined to bed  No vitals taken today, Exam not performed today   LABORATORY DATA:  I have reviewed the data as listed CBC Latest Ref Rng & Units 02/06/2020 02/05/2020 02/04/2020  WBC 4.0 - 10.5 K/uL 13.3(H) 9.7 7.7  Hemoglobin 12.0 - 15.0 g/dL 8.5(L) 8.3(L) 8.4(L)  Hematocrit 36.0 - 46.0 % 26.2(L) 25.4(L) 25.7(L)  Platelets 150 - 400 K/uL 90(L) 83(L) 83(L)     CMP Latest Ref Rng & Units 02/07/2020 02/06/2020 02/06/2020  Glucose 70 - 99 mg/dL 135(H) 96 165(H)  BUN 6 - 20 mg/dL '9 10 10  ' Creatinine 0.44 - 1.00 mg/dL 0.53 0.50 0.58  Sodium 135 - 145 mmol/L 132(L) 135 135  Potassium 3.5 - 5.1 mmol/L 3.3(L) 3.3(L) 3.4(L)  Chloride 98 - 111 mmol/L 99 100 100  CO2 22 - 32 mmol/L '23 25 24  ' Calcium 8.9 - 10.3 mg/dL 8.3(L) 8.4(L) 8.4(L)  Total Protein 6.5 - 8.1 g/dL 6.6 - 6.9  Total  Bilirubin 0.3 - 1.2 mg/dL 2.5(H) - 2.3(H)  Alkaline Phos 38 - 126 U/L 299(H) - 306(H)  AST 15 - 41 U/L 183(H) - 248(H)  ALT 0 - 44 U/L 155(H) - 178(H)      RADIOGRAPHIC STUDIES: I have personally reviewed the radiological images as listed and agreed with the findings in the report. No results found.   ASSESSMENT & PLAN:  ALOHILANI LEVENHAGEN is a 60 y.o. female with    1. 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 Her EUS biopsy with Dr Paulita Fujita on 09/25/19 shows pancreatic adenocarcinoma.  -Her 10/16/19 abdominal MRI showed multiple (>10) small liver lesions, most are consistent with metastatic lesions. Liver biopsy from 10/5/21confirmed metastatic pancreatic cancer. -I started her on first-line or neoadjuvant chemo with FOLFIRINOX q2weeks on 10/24/19. Goal of therapy is to control or shrink disease. Due to poor tolerance treatment stopped after C2 11/21/19.I switched her tosecond-line Gemcitabine 2 weeks on/1 week off starting 12/28/19.  -She did not tolerate Gemcitabine and was hospitalized after C2D1. She also recently contracted COVID-19.  -I discussed her response to chemo has been very limited as she has had significant progression. She also has poor toleration to chemotherapy. I do not recommend more chemotherapy because of this.  I discussed the overall very poor prognosis off treatment, with short life expectancy. I discussed managing her symptoms and quality of life with home care service so she does not need visits and hospitalization. She was overwhelmed by the poor prognosis.  She will discuss this with her husband more and we will further discuss in clinic on next visit   2.Nausea/vomiting, Diarrhea, abdominal cramps, COVID(+) in 01/2020 -secondary to underlying malignancy and chemotherapy -worse after C1 FOLFIRINOX, required ED visit for IV fluids, potassium,  antiemetics and treated for UTI -Admitted10/10-10/61frN/V/D, diagnosed with colitis and found to have left iliac vein DVT, discharged on 11/09/19 -After C2D1 Gemcitabine, she was hospitalized on 02/01/19 for Abdominal pain, nausea, vomiting secondary to #1. She was also found to be COVID positive.  -Since hospital discharge yesterday, she has been weak, with another bout of diarrhea and nausea. I instructed her to restart imodium. I will refill Lomotil. For her diffuse abdominal cramping I will call in bentyl.  -I discussed her diarrhea could be from recent antibiotics or pancreatic insufficiency from her cancer. She has Creon, I recommend she take 2 tabs before meal, unless she is not eating.  -She is not eating as much given she feels full on TPN which she uses at night. I will refill her Marinol to help her appetite.  -I discussed if with imodium, lomotil and bentyl she is not improving, she may have to return to ED for better management, given our COVID guideline.   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 Comorbidities: Lupus, HTN, Migraines  -She is being followed by Rheumatologist Dr HWyline Copasand being treated with weekly Methotrexate injections, Plaquenil andDiclofenac. -For Migraines is well controlled on Topomax. She has been seen by Dr AJaynee Eagles   5. Anxiety,insomnia,Social Support  -She has been very anxioussincediagnosis. -[place] She does not have  children. She does have a brother who lives close by but no  other near relatives. -She plans to apply for disability -She has been referred to social work  6. Genetics negative for pathogenetic mutations.    PLAN: -I called in Marinol, Bentyl and Lomotil today  -she knows to go to ED if her diarrhea gets worse to rule out infection  -phone visit next week -lab, flush and f/u in office in 2 weeks    No problem-specific Assessment & Plan notes found for this encounter.   No orders of the defined types were placed in this encounter.  I discussed the assessment and treatment plan with the patient. The patient was provided an opportunity to ask questions and all were answered. The patient agreed with the plan and demonstrated an understanding of the instructions.  The patient was advised to call back or seek an in-person evaluation if the symptoms worsen or if the condition fails to improve as anticipated.  The total time spent in the appointment was 30 minutes.    Truitt Merle, MD 02/08/2020   I, Joslyn Devon, am acting as scribe for Truitt Merle, MD.   I have reviewed the above documentation for accuracy and completeness, and I agree with the above.

## 2020-02-09 ENCOUNTER — Encounter: Payer: Self-pay | Admitting: Hematology

## 2020-02-11 ENCOUNTER — Other Ambulatory Visit: Payer: BC Managed Care – PPO

## 2020-02-11 ENCOUNTER — Ambulatory Visit: Payer: BC Managed Care – PPO | Admitting: Hematology

## 2020-02-11 ENCOUNTER — Ambulatory Visit: Payer: BC Managed Care – PPO

## 2020-02-12 ENCOUNTER — Telehealth: Payer: Self-pay | Admitting: *Deleted

## 2020-02-12 ENCOUNTER — Telehealth: Payer: Self-pay | Admitting: Hematology

## 2020-02-12 DIAGNOSIS — K529 Noninfective gastroenteritis and colitis, unspecified: Secondary | ICD-10-CM | POA: Diagnosis not present

## 2020-02-12 DIAGNOSIS — R1310 Dysphagia, unspecified: Secondary | ICD-10-CM | POA: Diagnosis not present

## 2020-02-12 DIAGNOSIS — R112 Nausea with vomiting, unspecified: Secondary | ICD-10-CM | POA: Diagnosis not present

## 2020-02-12 DIAGNOSIS — E440 Moderate protein-calorie malnutrition: Secondary | ICD-10-CM | POA: Diagnosis not present

## 2020-02-12 NOTE — Telephone Encounter (Signed)
Scheduled appts per 1/14 los. Pt confirmed appt dates and times.

## 2020-02-12 NOTE — Telephone Encounter (Signed)
Notified of message below. Verbalized understanding 

## 2020-02-12 NOTE — Telephone Encounter (Signed)
Cynthia Frye left a message stating that the medication that Dr Burr Medico gave her last week "is not doing any good".   "Stomach is cramping real bad, diarrhea is a little better, but I'm still having a lot of pain. Every time I come off of TPN, I throw up pretty bad.  Discussed taking a zofran tablet 1-2 hours prior to end of TPN infusion to see if that helps.

## 2020-02-12 NOTE — Telephone Encounter (Signed)
Please also suggest her to increase pain meds, she has oxycodone 5mg  at home, use 1-2 tabs every 4 hrs as needed, she has a virtual visit with me on 1/20, will review again. Thanks  Truitt Merle MD

## 2020-02-13 ENCOUNTER — Telehealth: Payer: Self-pay | Admitting: Hematology

## 2020-02-13 NOTE — Telephone Encounter (Signed)
Contacted patient to verify telephone visit for pre reg °

## 2020-02-13 NOTE — Progress Notes (Signed)
[place]   Telephone:([phone]) [phone] Fax:(336) [phone]   Clinic Follow up Note   Patient Care Team: Josetta Huddle, MD as PCP - General (Internal Medicine) Jonnie Finner, RN as Oncology Nurse Navigator Truitt Merle, MD as Consulting Physician (Oncology) Arta Silence, MD as Consulting Physician (Gastroenterology) [name], MD as Consulting Physician (General Surgery)   I connected with Cynthia Frye on 02/14/2020 at  9:40 AM EST by video enabled telemedicine visit and verified that I am speaking with the correct person using two identifiers.  I discussed the limitations, risks, security and privacy concerns of performing an evaluation and management service by telephone and the availability of in person appointments. I also discussed with the patient that there may be a patient responsible charge related to this service. The patient expressed understanding and agreed to proceed.   Other persons participating in the visit and their role in the encounter:  Her husband   Patient's location:  Home  Provider's location:  Office   CHIEF COMPLAINT: F/u of Pancreatic Cancer  SUMMARY OF ONCOLOGIC HISTORY: Oncology History Overview Note  Cancer Staging Pancreatic cancer [facility]) Staging form: Exocrine Pancreas, AJCC 8th Edition - Clinical stage from 10/18/2019: Stage IV (cT3, cN1, cM1) - Signed by Truitt Merle, MD on 10/18/2019    Pancreatic cancer ([place])  09/19/2019 Imaging   CT AP w contrast 09/19/19  IMPRESSION: 1. Findings are highly concerning for probable primary pancreatic adenocarcinoma in the anterior aspect of the pancreatic head. Several prominent borderline enlarged lymph nodes are noted in the hepatoduodenal nodal station, and there are multiple indeterminate liver lesions which are highly concerning for probable hepatic metastases. Further evaluation with nonemergent abdominal MRI with and without IV gadolinium with MRCP is recommended in the near future  to better evaluate these findings.   09/25/2019 Procedure   Upper EUS by Dr Paulita Fujita  IMPRESSION -There was no evidence of significant pathology in the left lobe of the liver.  -A few lymph nodes were visualized and measures in the peripancreatic region and porta hepata region.  -Hyperchoic material consistent with sludge was visualized endosonographically in the gallbladder.  -There was no sign of significant pathology in the common bile duct.  -A mass was identified in the pancreatic head. If biopsy results show adenocarcinoma, it would be staged T3N1Mx by endosonographic criteria. Fine needle aspiration performed.    09/25/2019 Initial Biopsy   FINAL MICROSCOPIC DIAGNOSIS: Fine needle aspirate, Pancreas;  MALIGNANT CELLS PRESENT CONSISTENT WITH ADENOCARCINOMA.    10/02/2019 Initial Diagnosis   Pancreatic cancer ([place])   10/11/2019 Procedure   PAC placement y Dr Barry Dienes    10/15/2019 Imaging   CT Chest  IMPRESSION: 1. Small anterior left pneumothorax with dependent atelectasis in the left lower lobe. 2. Increased number of bilateral axillary and subpectoral lymph nodes with mild lymphadenopathy in the left axilla. While this would be an atypical presentation for metastatic pancreatic cancer, this possibility is not excluded 3. Main duct dilatation in the pancreas, better assessed on abdomen CT 09/19/2019.   10/16/2019 Imaging   MRI Abdomen  IMPRESSION: 1. Substantially motion degraded scan. 2. Probable persistent small anterior left lung base pneumothorax, better seen on chest CT from 1 day prior. 3. Poorly marginated hypoenhancing 3.7 x 2.9 cm pancreatic head mass, which appears to invade the anterior peripancreatic fat, compatible with known pancreatic adenocarcinoma. Diffuse irregular dilatation of the main pancreatic duct in the pancreatic body and tail. Mild narrowing of the main portal vein by the mass.  Abdominal vasculature remains patent and otherwise uninvolved. 4.  Numerous (greater than 10) small liver masses scattered throughout the liver, largest 1.0 cm, which appear to demonstrate targetoid enhancement on the limited motion degraded postcontrast sequences, compatible with liver metastases. 5. Mild porta hepatis adenopathy, suspicious for metastatic disease.   10/18/2019 Cancer Staging   Staging form: Exocrine Pancreas, AJCC 8th Edition - Clinical stage from 10/18/2019: Stage IV (cT3, cN1, cM1) - Signed by Truitt Merle, MD on 10/18/2019   10/24/2019 - 11/21/2019 Chemotherapy   First-line FOLFIRINOX q2weeks starting 10/24/19. C2 postponed due to N/V/D and dose reduced 20-30%. Given poor tolerance, stopped after 2 cycles.    10/30/2019 Pathology Results   FINAL MICROSCOPIC DIAGNOSIS:   A. LIVER, LESION, BIOPSY:  -  Metastatic carcinoma  -  See comment   COMMENT:   By immunohistochemistry, the neoplastic cells are positive for  cytokeratin 7 and GATA3 with patchy nonspecific staining for PAX 8 but  negative for TTF-1, CDX2 and cytokeratin 20.  Overall, the findings are  consistent with metastasis of the patient's known breast carcinoma.  Prognostic panel (ER, PR, Her-2) is pending and will be reported in an  addendum.    ADDENDUM:   Dr. Laurence Ferrari notified us (November 01, 2019) that the patient was also  being worked up for a pancreatic mass.  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  In addition, ER and PR  are negative.  Gata-3 can be expressed in the pancreatic  adenocarcinomas; therefore, pancreatobiliary primary remains in the  differential.    11/02/2019 Genetic Testing   Negative genetic testing: no pathogenic variants detected in [employer] Common Hereditary Cancers Panel.  The report date is November 02, 2019.   The Common Hereditary Cancers Panel offered by [facility] includes sequencing and/or deletion duplication testing of the following 48 genes: APC, ATM, AXIN2, BARD1, BMPR1A, BRCA1, BRCA2, BRIP1,  CDH1, CDK4, CDKN2A (p14ARF), CDKN2A (p16INK4a), CHEK2, CTNNA1, DICER1, EPCAM (Deletion/duplication testing only), GREM1 (promoter region deletion/duplication testing only), KIT, MEN1, MLH1, MSH2, MSH3, MSH6, MUTYH, NBN, NF1, NHTL1, PALB2, PDGFRA, PMS2, POLD1, POLE, PTEN, RAD50, RAD51C, RAD51D, RNF43, SDHB, SDHC, SDHD, SMAD4, SMARCA4. STK11, TP53, TSC1, TSC2, and VHL.  The following genes were evaluated for sequence changes only: SDHA and HOXB13 c.251G>A variant only.   11/04/2019 Imaging   CT AP  IMPRESSION: 1. Circumferential bowel wall thickening with adjacent fat stranding throughout the colon, most predominant in the transverse colon. This is consistent with colitis, which may be infectious or inflammatory in etiology. 2. Ill-defined pancreatic head mass consistent with known malignancy, similar to mildly increased in comparison to prior CT. There are innumerable hypodense masses throughout the liver, increased in conspicuity in comparison to prior CT. Findings are worrisome for worsening metastatic disease. 3. Filling defect in the LEFT internal iliac vein, likely a thrombus with differential considerations including mixing artifact.   11/24/2019 Imaging   CT AP  IMPRESSION: Pancreatic head mass again noted compatible with patient's given history of pancreatic cancer.   Numerous metastases throughout the liver, enlarging since prior study.     12/10/2019 Procedure   Upper endoscopy by Dr Michail Sermon  IMPRESSION - Z-line regular, 42 cm from the incisors. - Esophageal ulcer with stigmata of recent bleeding. - Gastritis. Biopsied. - Mucosal changes in the duodenum. Biopsied. - Normal second portion of the duodenum. - Gastric and duodenal thickening concerning for inflammation from mets or due to mets from known pancreatic cancer.   FINAL MICROSCOPIC DIAGNOSIS:  A. DUODENUM, BIOPSY:  - Benign duodenal mucosa.  - No dysplasia or malignancy.   B. STOMACH, BIOPSY:  - Antral  mucosa with mild chronic active inflammation.  - No intestinal metaplasia, dysplasia or carcinoma.   12/28/2019 -  Chemotherapy   Change her to second-line Gemcitabine 2 weeks on/1 week off starting 12/28/19.    01/16/2020 Imaging   MRI abdomen  IMPRESSION: 1. Prominently dilated dorsal pancreatic duct extending to the level of the mass in the junction of the pancreatic head and body. The mass abuts the gastric wall, portal vein, and common bile duct, leading to mild narrowing of the common bile duct down to about 0.3 cm, with the common hepatic duct mildly prominent 0.9 cm. The mass has mildly enlarged compared to the CT examination of 11/24/2019. 2. Extensive metastatic burden throughout all lobes of the liver, substantially increased from 11/24/2019 and fairly similar to 01/11/2020. 3. Trace left pleural effusion with adjacent atelectasis in the left lower lobe. Borderline cardiomegaly. 4. Sludge in the gallbladder with gallbladder wall thickening.   01/24/2020 Imaging   CT AP  IMPRESSION: 1. No acute abnormality. 2. No significant change in the known pancreatic malignancy with associated marked dilatation of the pancreatic duct. 3. Stable extensive liver metastatic disease.      CURRENT THERAPY:  Second-line Gemcitabine 2 weeks on/1 week off starting 12/28/19, stopped after C2D1 due to poor toleration  INTERVAL HISTORY:  Cynthia Frye is scheduled for a virtual visit today, she identified herself through vedio, and her husband joined the visit.  Diarrhea has much improved, she is tating imodium as needed She has more nausea when she completes TPN in morning, she usually gets TPN at night for 12 hrs. She has been using zofran and compazine, but has not used ativan for nausea   Still has abdominal cramps intermittently, tried oxycodone but sometime she vomitted back up, pain overall is not bad Quite fatigued, not able to do much at hime. Husband Cynthia Frye takes care of her at  home     All other systems were reviewed with the patient and are negative.  MEDICAL HISTORY:  Past Medical History:  Diagnosis Date  . Allergies    peanuts, corn, beans, red grapefruit, naval oranges  . Asthma    allergy shots and medication  . Cancer [facility])    pancreatic  . Collagen vascular disease ([place])   . DDD (degenerative disc disease), lumbar   . Eustachian tube dysfunction 12/2012   rhinitis, vertigo- Dr. Wilburn Cornelia, ENT   . Fibroids   . Heart murmur    Echo 1/18: EF 55-60, no RWMA, normal diastolic function, trivial AI, PASP 32  . History of cardiac catheterization    LHC 3/18: normal coronary arteries.   . History of nuclear stress test    ETT-Myoview 2/18: EF 62, + ECG response; apical and distal septal ischemia; intermediate risk.  Marland Kitchen Hypertension   . Iron deficiency anemia   . Lupus [facility]) 2011   Dr. Trudie Reed  . Migraine headache    trial of generic maxalt 10 mg, January 2021  . Obesity   . Seasonal allergies   . Seizure in childhood [facility])    as a child no treatment none x 30 years    SURGICAL HISTORY: Past Surgical History:  Procedure Laterality Date  . ABDOMINAL HYSTERECTOMY    . BACK SURGERY  2009  . BIOPSY  12/10/2019   Procedure: BIOPSY;  Surgeon: Wilford Corner, MD;  Location: [facility] ENDOSCOPY;  Service: Endoscopy;;  . COLONOSCOPY WITH PROPOFOL N/A 04/11/2012   Procedure: COLONOSCOPY WITH PROPOFOL;  Surgeon: Garlan Fair, MD;  Location: [facility] ENDOSCOPY;  Service: Endoscopy;  Laterality: N/A;  . ESOPHAGOGASTRODUODENOSCOPY (EGD) WITH PROPOFOL N/A 12/10/2019   Procedure: ESOPHAGOGASTRODUODENOSCOPY (EGD) WITH PROPOFOL;  Surgeon: Wilford Corner, MD;  Location: [facility] ENDOSCOPY;  Service: Endoscopy;  Laterality: N/A;  . HERNIA REPAIR     as child unbilical  . S5-K partial discectomy and laminectomy     Dr. Christella Noa  . LEFT HEART CATH AND CORONARY ANGIOGRAPHY N/A 04/09/2016   Procedure: Left Heart Cath and Coronary Angiography;  Surgeon: Nelva Bush, MD;   Location: [place] CV LAB;  Service: Cardiovascular;  Laterality: N/A;  . MOUTH SURGERY    . PORTACATH PLACEMENT N/A 10/11/2019   Procedure: INSERTION PORT-A-CATH LEFT SUBCLAVIAN;  Surgeon: [name], MD;  Location: [place];  Service: General;  Laterality: N/A;    I have reviewed the social history and family history with the patient and they are unchanged from previous note.  ALLERGIES:  is allergic to cinnamon, peanut-containing drug products, prednisone, corn oil, corn-containing products, and other.  MEDICATIONS:  Current Outpatient Medications  Medication Sig Dispense Refill  . Cholecalciferol (VITAMIN D) 2000 units CAPS Take 2,000 Units by mouth daily.     Marland Kitchen dexamethasone (DECADRON) 4 MG tablet Take 1 tablet (4 mg total) by mouth daily. Take 1 tablet once daily for 3 to 5 days after chemo 20 tablet 0  . diclofenac (VOLTAREN) 75 MG EC tablet Take 1 tablet (75 mg total) by mouth 2 (two) times daily as needed. (Patient taking differently: Take 75 mg by mouth 2 (two) times daily.)    . dicyclomine (BENTYL) 10 MG capsule Take 1 capsule (10 mg total) by mouth every 6 (six) hours as needed for spasms. 30 capsule 0  . Dietary Management Product (RHEUMATE) CAPS Take 1 capsule by mouth daily.     . diphenoxylate-atropine (LOMOTIL) 2.5-0.025 MG tablet Take 2 tablets by mouth 4 (four) times daily as needed for diarrhea or loose stools (use 2nd, if Imodium doesn't work. Max dose: 8 tablets/day.). 45 tablet 0  . dronabinol (MARINOL) 2.5 MG capsule Take 1 capsule (2.5 mg total) by mouth 2 (two) times daily before a meal. (Patient taking differently: Take 2.5 mg by mouth 2 (two) times daily as needed (nausea).) 30 capsule 0  . enoxaparin (LOVENOX) 80 MG/0.8ML injection Inject 0.8 mLs (80 mg total) into the skin every 12 (twelve) hours. 48 mL 2  . ferrous sulfate 325 (65 FE) MG tablet Take 325 mg by mouth daily with breakfast.    . fluticasone (FLONASE) 50 MCG/ACT nasal spray Place  2 sprays into both nostrils daily. (Patient taking differently: Place 2 sprays into both nostrils daily as needed for allergies.) 16 g 12  . hydroxychloroquine (PLAQUENIL) 200 MG tablet Take 400 mg by mouth daily.   5  . lipase/protease/amylase 24000-76000 units CPEP Take 1 capsule (24,000 Units total) by mouth 3 (three) times daily with meals. 270 capsule 0  . loperamide (IMODIUM) 2 MG capsule Take 2 capsules (4 mg total) by mouth 2 (two) times daily as needed for diarrhea or loose stools (Use 1st). 30 capsule 0  . LORazepam (ATIVAN) 0.5 MG tablet Take 1 tablet (0.5 mg total) by mouth every 8 (eight) hours as needed for anxiety (and anticiaptory n/v. DO NOT TAKE WITH XANAX). 30 tablet 0  . losartan-hydrochlorothiazide (HYZAAR) 50-12.5 MG tablet Take 1 tablet by  mouth daily.    . metoprolol succinate (TOPROL-XL) 50 MG 24 hr tablet Take 50 mg by mouth daily.     . mirtazapine (REMERON) 7.5 MG tablet TAKE 1 TABLET BY MOUTH AT BEDTIME. 90 tablet 1  . OLANZapine (ZYPREXA) 7.5 MG tablet Take 1 tablet (7.5 mg total) by mouth at bedtime. 30 tablet 0  . ondansetron (ZOFRAN) 8 MG tablet Take 1 tablet (8 mg total) by mouth every 8 (eight) hours as needed for nausea or vomiting. 30 tablet 3  . oxyCODONE (OXY IR/ROXICODONE) 5 MG immediate release tablet Take 1 tablet (5 mg total) by mouth every 6 (six) hours as needed for severe pain. 60 tablet 0  . senna-docusate (SENOKOT-S) 8.6-50 MG tablet Take 1 tablet by mouth 2 (two) times daily. (Patient taking differently: Take 1 tablet by mouth 2 (two) times daily as needed for mild constipation.) 60 tablet 1  . sterile water SOLN with amino acids 10 % SOLN 1.3 g/kg, dextrose 70 % SOLN 20 % Inject into the vein continuous. Pt on a 12 hour schedule. Last bag 01-31-20. Per husband. [facility]    . sucralfate (CARAFATE) 1 GM/10ML suspension Take 10 mLs (1 g total) by mouth 4 (four) times daily -  with meals and at bedtime. 420 mL 3  . topiramate  (TOPAMAX) 50 MG tablet Take 1 tablet (50 mg total) by mouth at bedtime. (Patient taking differently: Take 50 mg by mouth daily as needed (migraines).)     No current facility-administered medications for this visit.    PHYSICAL EXAMINATION: ECOG PERFORMANCE STATUS: 3 - Symptomatic, >50% confined to bed  No vitals taken today, Exam not performed today   LABORATORY DATA:  I have reviewed the data as listed CBC Latest Ref Rng & Units 02/06/2020 02/05/2020 02/04/2020  WBC 4.0 - 10.5 K/uL 13.3(H) 9.7 7.7  Hemoglobin 12.0 - 15.0 g/dL 8.5(L) 8.3(L) 8.4(L)  Hematocrit 36.0 - 46.0 % 26.2(L) 25.4(L) 25.7(L)  Platelets 150 - 400 K/uL 90(L) 83(L) 83(L)     CMP Latest Ref Rng & Units 02/07/2020 02/06/2020 02/06/2020  Glucose 70 - 99 mg/dL 135(H) 96 165(H)  BUN 6 - 20 mg/dL '9 10 10  ' Creatinine 0.44 - 1.00 mg/dL 0.53 0.50 0.58  Sodium 135 - 145 mmol/L 132(L) 135 135  Potassium 3.5 - 5.1 mmol/L 3.3(L) 3.3(L) 3.4(L)  Chloride 98 - 111 mmol/L 99 100 100  CO2 22 - 32 mmol/L '23 25 24  ' Calcium 8.9 - 10.3 mg/dL 8.3(L) 8.4(L) 8.4(L)  Total Protein 6.5 - 8.1 g/dL 6.6 - 6.9  Total Bilirubin 0.3 - 1.2 mg/dL 2.5(H) - 2.3(H)  Alkaline Phos 38 - 126 U/L 299(H) - 306(H)  AST 15 - 41 U/L 183(H) - 248(H)  ALT 0 - 44 U/L 155(H) - 178(H)      RADIOGRAPHIC STUDIES: I have personally reviewed the radiological images as listed and agreed with the findings in the report. No results found.   ASSESSMENT & PLAN:  SAVANNA DOOLEY is a 60 y.o. female with   1. 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 Her EUS biopsy with Dr Paulita Fujita on 09/25/19 shows pancreatic adenocarcinoma.  -Her 10/16/19 abdominal MRI showed multiple (>10) small liver lesions, most are consistent with metastatic lesions. Liver biopsy from 10/5/21confirmed metastatic pancreatic cancer. -I started her on first-line or neoadjuvant  chemo with FOLFIRINOX q2weeks on 10/24/19.  Goal of therapy is to control or shrink disease. Due to poor tolerance treatment stopped after C2 11/21/19.I switched her tosecond-line Gemcitabine 2 weeks on/1 week off starting 12/28/19. She did not tolerate Gemcitabine and was hospitalized after C2D1. She also recently contracted COVID-19 and was treated in hospital.  -I previously discussed her response to chemo has been very limited as she has had significant progression. She also has poor toleration to chemotherapy. I do not recommend more chemotherapy because of this.  I discussed the overall very poor prognosis off treatment, with short life expectancy. She plans to have her husband and brother come in with her together on next visit on 2/3 to further discuss in person  -continue symptom management and supportive care at home   2.Nausea/vomiting, Diarrhea, abdominal cramps, COVID(+) in 01/2020 -secondary to underlying malignancy and chemotherapy -worse after C1 FOLFIRINOX, required ED visit for IV fluids, potassium, antiemetics and treated for UTI -Admitted10/10-10/19frN/V/D, diagnosed with colitis and found to have left iliac vein DVT, discharged on 11/09/19 -After C2D1 Gemcitabine, she was hospitalized on 02/01/19 for Abdominal pain, nausea, vomiting secondary to #1. She was also found to be COVID positive.  -Since hospital discharge on 02/07/20, she has been weak, with another bout of diarrhea and nausea. I instructed her to restart imodium. I will refill Lomotil. For her diffuse abdominal cramping I will call in bentyl.  -For diarrhea she has Creon, I recommend she take 2 tabs before meal, unless she is not eating.  -She is not eating as much given she feels full on TPN which she uses at night. 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 Comorbidities: Lupus, HTN, Migraines  -She is being followed by Rheumatologist Dr HWyline Copasand being treated with weekly Methotrexate injections, Plaquenil andDiclofenac. -For Migraines is well controlled on Topomax. She has been seen by Dr AJaynee Eagles   5. Anxiety,insomnia,Social Support  -She has been very anxioussincediagnosis. -[place] not have children. She does have a brother who lives close by but no other near relatives. -She plans to apply for disability -She has been referred to social work  6. Genetics negative for pathogenetic mutations.    PLAN: -Reviewed nausea management, she will continue Zofran and Compazine, and add on Ativan as needed, I also called in olanzapine today -I suggested her to try skipping TPN for a few days, to see if her nausea and appetite will improve some -Lab and follow-up in person on 2/3, her husband and brother will come in with her to discuss [place] next [place]     No problem-specific Assessment & Plan notes found for this encounter.   No orders of the defined types were placed in this encounter.  I discussed the assessment and treatment plan with the patient. The patient was provided an opportunity to ask questions and  all were answered. The patient agreed with the plan and demonstrated an understanding of the instructions.  The patient was advised to call back or seek an in-person  evaluation if the symptoms worsen or if the condition fails to improve as anticipated.  The total time spent in the appointment was 20 minutes.    Truitt Merle, MD 02/14/2020   I, Joslyn Devon, am acting as scribe for Truitt Merle, MD.   I have reviewed the above documentation for accuracy and completeness, and I agree with the above.

## 2020-02-14 ENCOUNTER — Inpatient Hospital Stay (HOSPITAL_BASED_OUTPATIENT_CLINIC_OR_DEPARTMENT_OTHER): Payer: BC Managed Care – PPO | Admitting: Hematology

## 2020-02-14 ENCOUNTER — Encounter: Payer: Self-pay | Admitting: Hematology

## 2020-02-14 DIAGNOSIS — R188 Other ascites: Secondary | ICD-10-CM | POA: Diagnosis not present

## 2020-02-14 DIAGNOSIS — K219 Gastro-esophageal reflux disease without esophagitis: Secondary | ICD-10-CM | POA: Diagnosis present

## 2020-02-14 DIAGNOSIS — I10 Essential (primary) hypertension: Secondary | ICD-10-CM | POA: Diagnosis not present

## 2020-02-14 DIAGNOSIS — M329 Systemic lupus erythematosus, unspecified: Secondary | ICD-10-CM | POA: Diagnosis not present

## 2020-02-14 DIAGNOSIS — E43 Unspecified severe protein-calorie malnutrition: Secondary | ICD-10-CM | POA: Diagnosis not present

## 2020-02-14 DIAGNOSIS — R627 Adult failure to thrive: Secondary | ICD-10-CM | POA: Diagnosis present

## 2020-02-14 DIAGNOSIS — E8809 Other disorders of plasma-protein metabolism, not elsewhere classified: Secondary | ICD-10-CM | POA: Diagnosis not present

## 2020-02-14 DIAGNOSIS — R000 Tachycardia, unspecified: Secondary | ICD-10-CM | POA: Diagnosis not present

## 2020-02-14 DIAGNOSIS — E872 Acidosis: Secondary | ICD-10-CM | POA: Diagnosis not present

## 2020-02-14 DIAGNOSIS — Z6827 Body mass index (BMI) 27.0-27.9, adult: Secondary | ICD-10-CM | POA: Diagnosis not present

## 2020-02-14 DIAGNOSIS — K7290 Hepatic failure, unspecified without coma: Secondary | ICD-10-CM | POA: Diagnosis not present

## 2020-02-14 DIAGNOSIS — Z515 Encounter for palliative care: Secondary | ICD-10-CM | POA: Diagnosis not present

## 2020-02-14 DIAGNOSIS — Z86718 Personal history of other venous thrombosis and embolism: Secondary | ICD-10-CM | POA: Diagnosis not present

## 2020-02-14 DIAGNOSIS — Z7189 Other specified counseling: Secondary | ICD-10-CM | POA: Diagnosis not present

## 2020-02-14 DIAGNOSIS — Z8616 Personal history of COVID-19: Secondary | ICD-10-CM | POA: Diagnosis not present

## 2020-02-14 DIAGNOSIS — G9341 Metabolic encephalopathy: Secondary | ICD-10-CM | POA: Diagnosis not present

## 2020-02-14 DIAGNOSIS — R7989 Other specified abnormal findings of blood chemistry: Secondary | ICD-10-CM | POA: Diagnosis not present

## 2020-02-14 DIAGNOSIS — C250 Malignant neoplasm of head of pancreas: Secondary | ICD-10-CM | POA: Diagnosis not present

## 2020-02-14 DIAGNOSIS — U071 COVID-19: Secondary | ICD-10-CM | POA: Diagnosis not present

## 2020-02-14 DIAGNOSIS — C787 Secondary malignant neoplasm of liver and intrahepatic bile duct: Secondary | ICD-10-CM | POA: Diagnosis not present

## 2020-02-14 DIAGNOSIS — C259 Malignant neoplasm of pancreas, unspecified: Secondary | ICD-10-CM | POA: Diagnosis not present

## 2020-02-14 DIAGNOSIS — N12 Tubulo-interstitial nephritis, not specified as acute or chronic: Secondary | ICD-10-CM | POA: Diagnosis not present

## 2020-02-14 DIAGNOSIS — I2782 Chronic pulmonary embolism: Secondary | ICD-10-CM | POA: Diagnosis not present

## 2020-02-14 DIAGNOSIS — E871 Hypo-osmolality and hyponatremia: Secondary | ICD-10-CM | POA: Diagnosis not present

## 2020-02-14 DIAGNOSIS — A419 Sepsis, unspecified organism: Secondary | ICD-10-CM | POA: Diagnosis not present

## 2020-02-14 DIAGNOSIS — R112 Nausea with vomiting, unspecified: Secondary | ICD-10-CM

## 2020-02-14 DIAGNOSIS — D696 Thrombocytopenia, unspecified: Secondary | ICD-10-CM | POA: Diagnosis not present

## 2020-02-14 DIAGNOSIS — Z66 Do not resuscitate: Secondary | ICD-10-CM | POA: Diagnosis not present

## 2020-02-14 DIAGNOSIS — A4181 Sepsis due to Enterococcus: Secondary | ICD-10-CM | POA: Diagnosis not present

## 2020-02-14 DIAGNOSIS — Z86711 Personal history of pulmonary embolism: Secondary | ICD-10-CM | POA: Diagnosis not present

## 2020-02-14 DIAGNOSIS — E440 Moderate protein-calorie malnutrition: Secondary | ICD-10-CM | POA: Diagnosis not present

## 2020-02-14 DIAGNOSIS — E860 Dehydration: Secondary | ICD-10-CM | POA: Diagnosis not present

## 2020-02-14 DIAGNOSIS — E876 Hypokalemia: Secondary | ICD-10-CM | POA: Diagnosis not present

## 2020-02-14 DIAGNOSIS — J45909 Unspecified asthma, uncomplicated: Secondary | ICD-10-CM | POA: Diagnosis present

## 2020-02-14 DIAGNOSIS — R109 Unspecified abdominal pain: Secondary | ICD-10-CM | POA: Diagnosis not present

## 2020-02-14 DIAGNOSIS — D61818 Other pancytopenia: Secondary | ICD-10-CM | POA: Diagnosis not present

## 2020-02-14 DIAGNOSIS — K529 Noninfective gastroenteritis and colitis, unspecified: Secondary | ICD-10-CM | POA: Diagnosis not present

## 2020-02-14 DIAGNOSIS — I82412 Acute embolism and thrombosis of left femoral vein: Secondary | ICD-10-CM | POA: Diagnosis not present

## 2020-02-14 DIAGNOSIS — B952 Enterococcus as the cause of diseases classified elsewhere: Secondary | ICD-10-CM | POA: Diagnosis not present

## 2020-02-14 DIAGNOSIS — D630 Anemia in neoplastic disease: Secondary | ICD-10-CM | POA: Diagnosis not present

## 2020-02-14 DIAGNOSIS — J9811 Atelectasis: Secondary | ICD-10-CM | POA: Diagnosis not present

## 2020-02-14 DIAGNOSIS — R6520 Severe sepsis without septic shock: Secondary | ICD-10-CM | POA: Diagnosis not present

## 2020-02-14 DIAGNOSIS — R531 Weakness: Secondary | ICD-10-CM | POA: Diagnosis not present

## 2020-02-14 MED ORDER — OLANZAPINE 7.5 MG PO TABS: 7.5000 mg | ORAL_TABLET | Freq: Every day | ORAL | 0 refills | Status: DC

## 2020-02-15 DIAGNOSIS — K529 Noninfective gastroenteritis and colitis, unspecified: Secondary | ICD-10-CM | POA: Diagnosis not present

## 2020-02-15 DIAGNOSIS — R112 Nausea with vomiting, unspecified: Secondary | ICD-10-CM | POA: Diagnosis not present

## 2020-02-15 DIAGNOSIS — E440 Moderate protein-calorie malnutrition: Secondary | ICD-10-CM | POA: Diagnosis not present

## 2020-02-17 ENCOUNTER — Emergency Department (HOSPITAL_COMMUNITY): Payer: BC Managed Care – PPO

## 2020-02-17 ENCOUNTER — Encounter (HOSPITAL_COMMUNITY): Payer: Self-pay | Admitting: Emergency Medicine

## 2020-02-17 ENCOUNTER — Other Ambulatory Visit: Payer: Self-pay

## 2020-02-17 ENCOUNTER — Inpatient Hospital Stay (HOSPITAL_COMMUNITY)
Admission: EM | Admit: 2020-02-17 | Discharge: 2020-03-02 | DRG: 871 | Disposition: A | Discharge status: Hospice | Payer: BC Managed Care – PPO | Attending: Internal Medicine | Admitting: Internal Medicine

## 2020-02-17 DIAGNOSIS — B952 Enterococcus as the cause of diseases classified elsewhere: Secondary | ICD-10-CM | POA: Diagnosis present

## 2020-02-17 DIAGNOSIS — M329 Systemic lupus erythematosus, unspecified: Secondary | ICD-10-CM | POA: Diagnosis present

## 2020-02-17 DIAGNOSIS — E860 Dehydration: Secondary | ICD-10-CM | POA: Diagnosis present

## 2020-02-17 DIAGNOSIS — G9341 Metabolic encephalopathy: Secondary | ICD-10-CM | POA: Diagnosis not present

## 2020-02-17 DIAGNOSIS — E43 Unspecified severe protein-calorie malnutrition: Secondary | ICD-10-CM | POA: Diagnosis present

## 2020-02-17 DIAGNOSIS — D61818 Other pancytopenia: Secondary | ICD-10-CM | POA: Diagnosis present

## 2020-02-17 DIAGNOSIS — E8809 Other disorders of plasma-protein metabolism, not elsewhere classified: Secondary | ICD-10-CM | POA: Diagnosis not present

## 2020-02-17 DIAGNOSIS — C259 Malignant neoplasm of pancreas, unspecified: Secondary | ICD-10-CM

## 2020-02-17 DIAGNOSIS — R6520 Severe sepsis without septic shock: Secondary | ICD-10-CM | POA: Diagnosis present

## 2020-02-17 DIAGNOSIS — E872 Acidosis: Secondary | ICD-10-CM | POA: Diagnosis present

## 2020-02-17 DIAGNOSIS — J45909 Unspecified asthma, uncomplicated: Secondary | ICD-10-CM | POA: Diagnosis present

## 2020-02-17 DIAGNOSIS — I2782 Chronic pulmonary embolism: Secondary | ICD-10-CM | POA: Diagnosis present

## 2020-02-17 DIAGNOSIS — E876 Hypokalemia: Secondary | ICD-10-CM | POA: Diagnosis not present

## 2020-02-17 DIAGNOSIS — R188 Other ascites: Secondary | ICD-10-CM

## 2020-02-17 DIAGNOSIS — Z66 Do not resuscitate: Secondary | ICD-10-CM | POA: Diagnosis not present

## 2020-02-17 DIAGNOSIS — Z9221 Personal history of antineoplastic chemotherapy: Secondary | ICD-10-CM

## 2020-02-17 DIAGNOSIS — A419 Sepsis, unspecified organism: Secondary | ICD-10-CM | POA: Diagnosis present

## 2020-02-17 DIAGNOSIS — I82412 Acute embolism and thrombosis of left femoral vein: Secondary | ICD-10-CM | POA: Diagnosis not present

## 2020-02-17 DIAGNOSIS — K219 Gastro-esophageal reflux disease without esophagitis: Secondary | ICD-10-CM | POA: Diagnosis present

## 2020-02-17 DIAGNOSIS — E871 Hypo-osmolality and hyponatremia: Secondary | ICD-10-CM | POA: Diagnosis not present

## 2020-02-17 DIAGNOSIS — N12 Tubulo-interstitial nephritis, not specified as acute or chronic: Secondary | ICD-10-CM | POA: Diagnosis present

## 2020-02-17 DIAGNOSIS — Z823 Family history of stroke: Secondary | ICD-10-CM

## 2020-02-17 DIAGNOSIS — Z86718 Personal history of other venous thrombosis and embolism: Secondary | ICD-10-CM

## 2020-02-17 DIAGNOSIS — R531 Weakness: Secondary | ICD-10-CM | POA: Diagnosis not present

## 2020-02-17 DIAGNOSIS — Z8249 Family history of ischemic heart disease and other diseases of the circulatory system: Secondary | ICD-10-CM

## 2020-02-17 DIAGNOSIS — I10 Essential (primary) hypertension: Secondary | ICD-10-CM | POA: Diagnosis present

## 2020-02-17 DIAGNOSIS — R7989 Other specified abnormal findings of blood chemistry: Secondary | ICD-10-CM | POA: Diagnosis not present

## 2020-02-17 DIAGNOSIS — A4181 Sepsis due to Enterococcus: Secondary | ICD-10-CM | POA: Diagnosis present

## 2020-02-17 DIAGNOSIS — Z515 Encounter for palliative care: Secondary | ICD-10-CM | POA: Diagnosis not present

## 2020-02-17 DIAGNOSIS — C787 Secondary malignant neoplasm of liver and intrahepatic bile duct: Secondary | ICD-10-CM | POA: Diagnosis present

## 2020-02-17 DIAGNOSIS — I82409 Acute embolism and thrombosis of unspecified deep veins of unspecified lower extremity: Secondary | ICD-10-CM | POA: Diagnosis present

## 2020-02-17 DIAGNOSIS — R627 Adult failure to thrive: Secondary | ICD-10-CM | POA: Diagnosis present

## 2020-02-17 DIAGNOSIS — K7290 Hepatic failure, unspecified without coma: Secondary | ICD-10-CM | POA: Diagnosis present

## 2020-02-17 DIAGNOSIS — Z8616 Personal history of COVID-19: Secondary | ICD-10-CM | POA: Diagnosis not present

## 2020-02-17 DIAGNOSIS — Z825 Family history of asthma and other chronic lower respiratory diseases: Secondary | ICD-10-CM

## 2020-02-17 DIAGNOSIS — Z7189 Other specified counseling: Secondary | ICD-10-CM | POA: Diagnosis not present

## 2020-02-17 DIAGNOSIS — C250 Malignant neoplasm of head of pancreas: Secondary | ICD-10-CM | POA: Diagnosis present

## 2020-02-17 DIAGNOSIS — D630 Anemia in neoplastic disease: Secondary | ICD-10-CM | POA: Diagnosis present

## 2020-02-17 DIAGNOSIS — G43909 Migraine, unspecified, not intractable, without status migrainosus: Secondary | ICD-10-CM | POA: Diagnosis present

## 2020-02-17 DIAGNOSIS — Z833 Family history of diabetes mellitus: Secondary | ICD-10-CM

## 2020-02-17 LAB — PROTIME-INR
INR: 1.4 — ABNORMAL HIGH (ref 0.8–1.2)
Prothrombin Time: 16.4 seconds — ABNORMAL HIGH (ref 11.4–15.2)

## 2020-02-17 LAB — URINALYSIS, ROUTINE W REFLEX MICROSCOPIC
Bilirubin Urine: NEGATIVE
Glucose, UA: NEGATIVE mg/dL
Hgb urine dipstick: NEGATIVE
Ketones, ur: NEGATIVE mg/dL
Leukocytes,Ua: NEGATIVE
Nitrite: NEGATIVE
Protein, ur: NEGATIVE mg/dL
Specific Gravity, Urine: 1.033 — ABNORMAL HIGH (ref 1.005–1.030)
pH: 8 (ref 5.0–8.0)

## 2020-02-17 LAB — CBC WITH DIFFERENTIAL/PLATELET
Abs Immature Granulocytes: 0.08 10*3/uL — ABNORMAL HIGH (ref 0.00–0.07)
Basophils Absolute: 0.1 10*3/uL (ref 0.0–0.1)
Basophils Relative: 1 %
Eosinophils Absolute: 0.1 10*3/uL (ref 0.0–0.5)
Eosinophils Relative: 1 %
HCT: 30.4 % — ABNORMAL LOW (ref 36.0–46.0)
Hemoglobin: 9.4 g/dL — ABNORMAL LOW (ref 12.0–15.0)
Immature Granulocytes: 1 %
Lymphocytes Relative: 20 %
Lymphs Abs: 1.9 10*3/uL (ref 0.7–4.0)
MCH: 31.3 pg (ref 26.0–34.0)
MCHC: 30.9 g/dL (ref 30.0–36.0)
MCV: 101.3 fL — ABNORMAL HIGH (ref 80.0–100.0)
Monocytes Absolute: 1.3 10*3/uL — ABNORMAL HIGH (ref 0.1–1.0)
Monocytes Relative: 14 %
Neutro Abs: 5.9 10*3/uL (ref 1.7–7.7)
Neutrophils Relative %: 63 %
Platelets: 189 10*3/uL (ref 150–400)
RBC: 3 MIL/uL — ABNORMAL LOW (ref 3.87–5.11)
RDW: 24.1 % — ABNORMAL HIGH (ref 11.5–15.5)
WBC: 9.3 10*3/uL (ref 4.0–10.5)
nRBC: 1.6 % — ABNORMAL HIGH (ref 0.0–0.2)

## 2020-02-17 LAB — COMPREHENSIVE METABOLIC PANEL
ALT: 78 U/L — ABNORMAL HIGH (ref 0–44)
AST: 150 U/L — ABNORMAL HIGH (ref 15–41)
Albumin: 2.1 g/dL — ABNORMAL LOW (ref 3.5–5.0)
Alkaline Phosphatase: 241 U/L — ABNORMAL HIGH (ref 38–126)
Anion gap: 14 (ref 5–15)
BUN: 21 mg/dL — ABNORMAL HIGH (ref 6–20)
CO2: 20 mmol/L — ABNORMAL LOW (ref 22–32)
Calcium: 8.4 mg/dL — ABNORMAL LOW (ref 8.9–10.3)
Chloride: 101 mmol/L (ref 98–111)
Creatinine, Ser: 0.75 mg/dL (ref 0.44–1.00)
GFR, Estimated: >60 mL/min (ref >60)
Glucose, Bld: 134 mg/dL — ABNORMAL HIGH (ref 70–99)
Potassium: 4.3 mmol/L (ref 3.5–5.1)
Sodium: 135 mmol/L (ref 135–145)
Total Bilirubin: 4.9 mg/dL — ABNORMAL HIGH (ref 0.3–1.2)
Total Protein: 7.8 g/dL (ref 6.5–8.1)

## 2020-02-17 LAB — LACTIC ACID, PLASMA
Lactic Acid, Venous: 4.2 mmol/L (ref 0.5–1.9)
Lactic Acid, Venous: 3.1 mmol/L (ref 0.5–1.9)

## 2020-02-17 LAB — I-STAT BETA HCG BLOOD, ED (MC, WL, AP ONLY): I-stat hCG, quantitative: 53.9 m[IU]/mL — ABNORMAL HIGH (ref <5)

## 2020-02-17 LAB — APTT: aPTT: 47 seconds — ABNORMAL HIGH (ref 24–36)

## 2020-02-17 LAB — TROPONIN I (HIGH SENSITIVITY): Troponin I (High Sensitivity): 14 ng/L (ref <18)

## 2020-02-17 MED ORDER — LACTATED RINGERS IV SOLN: INTRAVENOUS | Status: DC

## 2020-02-17 MED ORDER — LACTATED RINGERS IV BOLUS
1000.0000 mL | Freq: Once | INTRAVENOUS | Status: AC
  Administered 2020-02-17: 1000 mL via INTRAVENOUS

## 2020-02-17 MED ORDER — SODIUM CHLORIDE 0.9 % IV SOLN
2.0000 g | Freq: Once | INTRAVENOUS | Status: AC
  Administered 2020-02-17: 2 g via INTRAVENOUS
  Filled 2020-02-17: qty 2

## 2020-02-17 MED ORDER — METRONIDAZOLE IN NACL 5-0.79 MG/ML-% IV SOLN
500.0000 mg | Freq: Once | INTRAVENOUS | Status: AC
  Administered 2020-02-17: 500 mg via INTRAVENOUS
  Filled 2020-02-17: qty 100

## 2020-02-17 MED ORDER — FENTANYL CITRATE (PF) 100 MCG/2ML IJ SOLN
50.0000 ug | Freq: Once | INTRAMUSCULAR | Status: AC
  Administered 2020-02-17: 50 ug via INTRAVENOUS
  Filled 2020-02-17: qty 2

## 2020-02-17 MED ORDER — FENTANYL CITRATE (PF) 100 MCG/2ML IJ SOLN
50.0000 ug | Freq: Once | INTRAMUSCULAR | Status: AC
Start: 2020-02-17 — End: 2020-02-17
  Administered 2020-02-17: 50 ug via INTRAVENOUS
  Filled 2020-02-17: qty 2

## 2020-02-17 MED ORDER — PANTOPRAZOLE SODIUM 40 MG IV SOLR
40.0000 mg | Freq: Once | INTRAVENOUS | Status: AC
  Administered 2020-02-17: 40 mg via INTRAVENOUS
  Filled 2020-02-17: qty 40

## 2020-02-17 MED ORDER — VANCOMYCIN HCL 2000 MG/400ML IV SOLN
2000.0000 mg | Freq: Once | INTRAVENOUS | Status: AC
  Administered 2020-02-17: 2000 mg via INTRAVENOUS
  Filled 2020-02-17: qty 400

## 2020-02-17 MED ORDER — ACETAMINOPHEN 650 MG RE SUPP: 650.0000 mg | Freq: Four times a day (QID) | RECTAL | Status: DC | PRN

## 2020-02-17 MED ORDER — VANCOMYCIN HCL IN DEXTROSE 1-5 GM/200ML-% IV SOLN
1000.0000 mg | Freq: Two times a day (BID) | INTRAVENOUS | Status: DC
  Administered 2020-02-18: 1000 mg via INTRAVENOUS
  Filled 2020-02-17: qty 200

## 2020-02-17 MED ORDER — ACETAMINOPHEN 325 MG PO TABS
650.0000 mg | ORAL_TABLET | Freq: Four times a day (QID) | ORAL | Status: DC | PRN
  Administered 2020-02-18 – 2020-02-19 (×3): 650 mg via ORAL
  Filled 2020-02-17 (×5): qty 2

## 2020-02-17 MED ORDER — LACTATED RINGERS IV SOLN
INTRAVENOUS | Status: DC
Start: 2020-02-17 — End: 2020-02-17

## 2020-02-17 MED ORDER — CHLORHEXIDINE GLUCONATE CLOTH 2 % EX PADS
6.0000 | MEDICATED_PAD | Freq: Every day | CUTANEOUS | Status: DC
  Administered 2020-02-18 – 2020-03-02 (×14): 6 via TOPICAL

## 2020-02-17 MED ORDER — LACTATED RINGERS IV BOLUS (SEPSIS)
1000.0000 mL | Freq: Once | INTRAVENOUS | Status: AC
  Administered 2020-02-17: 1000 mL via INTRAVENOUS

## 2020-02-17 MED ORDER — SODIUM CHLORIDE 0.9 % IV BOLUS
500.0000 mL | Freq: Once | INTRAVENOUS | Status: AC
  Administered 2020-02-17: 500 mL via INTRAVENOUS

## 2020-02-17 MED ORDER — IOHEXOL 300 MG/ML  SOLN
100.0000 mL | Freq: Once | INTRAMUSCULAR | Status: AC | PRN
  Administered 2020-02-17: 100 mL via INTRAVENOUS

## 2020-02-17 MED ORDER — ENOXAPARIN SODIUM 80 MG/0.8ML ~~LOC~~ SOLN
80.0000 mg | Freq: Two times a day (BID) | SUBCUTANEOUS | Status: DC
  Administered 2020-02-17 – 2020-03-02 (×28): 80 mg via SUBCUTANEOUS
  Filled 2020-02-17 (×29): qty 0.8

## 2020-02-17 MED ORDER — SODIUM CHLORIDE 0.9 % IV SOLN
2.0000 g | Freq: Three times a day (TID) | INTRAVENOUS | Status: DC
  Administered 2020-02-17 – 2020-02-18 (×2): 2 g via INTRAVENOUS
  Filled 2020-02-17 (×2): qty 2

## 2020-02-17 NOTE — H&P (Signed)
TRH H&P    Patient Demographics:    Cynthia Frye, is a 60 y.o. female  MRN: [id number]  DOB - August 15, 1960  Admit Date - 02/17/2020  Referring MD/NP/PA: Dr. Antony Haste  Outpatient Primary MD for the patient is Josetta Huddle, MD  Patient coming from: Home  Chief complaint-nausea and vomiting   HPI:    Cynthia Frye  is a 60 y.o. female, with medical history of pancreatic cancer s/p chemotherapy, lupus, asthma, recent COVID-19 infection, which was treated with remdesivir in the hospital for 3 days.  She did not have symptoms at that time.  Patient did get Pfizer vaccine in July, August 2021.  Patient came to hospital after she had intractable nausea vomiting and abdominal pain.  She also complains of generalized body aches.  Initial lab work showed lactic acid elevated 4.2.  CT abdomen pelvis showed bilateral pyelonephritis.  Patient denies any urinary symptoms.  No flank pain.  Denies diarrhea. Denies chest pain or shortness of breath.  Denies fever. She is currently not requiring oxygen. Patient has a history of metastatic pancreatic cancer with mets to liver, she was started on chemotherapy however chemotherapy has been stopped as per oncology as patient was intolerant to chemotherapy.  Patient has very poor prognosis as per oncology with very short life expectancy.  Patient was started on vancomycin, cefepime and Flagyl in the ED.  She received IV fluid boluses as per sepsis protocol.    Review of systems:    In addition to the HPI above,    All other systems reviewed and are negative.    Past History of the following :    Past Medical History:  Diagnosis Date  . Allergies    peanuts, corn, beans, red grapefruit, naval oranges  . Asthma    allergy shots and medication  . Cancer [facility])    pancreatic  . Collagen vascular disease ([place])   . DDD (degenerative disc disease), lumbar   . Eustachian tube  dysfunction 12/2012   rhinitis, vertigo- Dr. Wilburn Cornelia, ENT   . Fibroids   . Heart murmur    Echo 1/18: EF 55-60, no RWMA, normal diastolic function, trivial AI, PASP 32  . History of cardiac catheterization    LHC 3/18: normal coronary arteries.   . History of nuclear stress test    ETT-Myoview 2/18: EF 62, + ECG response; apical and distal septal ischemia; intermediate risk.  Marland Kitchen Hypertension   . Iron deficiency anemia   . Lupus [facility]) 2011   Dr. Trudie Reed  . Migraine headache    trial of generic maxalt 10 mg, January 2021  . Obesity   . Seasonal allergies   . Seizure in childhood [facility])    as a child no treatment none x 30 years      Past Surgical History:  Procedure Laterality Date  . ABDOMINAL HYSTERECTOMY    . BACK SURGERY  2009  . BIOPSY  12/10/2019   Procedure: BIOPSY;  Surgeon: Wilford Corner, MD;  Location: [facility] ENDOSCOPY;  Service: Endoscopy;;  . COLONOSCOPY WITH PROPOFOL  N/A 04/11/2012   Procedure: COLONOSCOPY WITH PROPOFOL;  Surgeon: Garlan Fair, MD;  Location: [facility] ENDOSCOPY;  Service: Endoscopy;  Laterality: N/A;  . ESOPHAGOGASTRODUODENOSCOPY (EGD) WITH PROPOFOL N/A 12/10/2019   Procedure: ESOPHAGOGASTRODUODENOSCOPY (EGD) WITH PROPOFOL;  Surgeon: Wilford Corner, MD;  Location: [facility] ENDOS[facility];  Service: Endoscopy;  Laterality: N/A;  . HERNIA REPAIR     as child unbilical  . 0000000 partial discectomy and laminectomy     Dr. Christella Noa  . LEFT HEART CATH AND CORONARY ANGIOGRAPHY N/A 04/09/2016   Procedure: Left Heart Cath and Coronary Angiography;  Surgeon: Nelva Bush, MD;  Location: [place] CV LAB;  Service: Cardiovascular;  Laterality: N/A;  . MOUTH SURGERY    . PORTACATH PLACEMENT N/A 10/11/2019   Procedure: INSERTION PORT-A-CATH LEFT SUBCLAVIAN;  Surgeon: [name], MD;  Location: [place];  Service: General;  Laterality: N/A;      Social History:      Social History   Tobacco Use  . Smoking status: Never Smoker  . Smokeless  tobacco: Never Used  Substance Use Topics  . Alcohol use: No       Family History :     Family History  Problem Relation Age of Onset  . Asthma Mother   . Diabetes Mother   . Hypertension Mother   . Heart attack Father 42  . CAD Father   . Stroke Maternal Aunt   . Stroke Paternal Aunt   . Stroke Paternal Uncle   . Gout Other        uncles   . Cancer Niece        paternal half sister's daughter; unknown type; unknown age diagnosed  . Ovarian cancer Neg Hx   . Breast cancer Neg Hx   . Colon cancer Neg Hx   . Migraines Neg Hx       Home Medications:   Prior to Admission medications   Medication Sig Start Date End Date Taking? Authorizing Provider  Cholecalciferol (VITAMIN D) 2000 units CAPS Take 2,000 Units by mouth daily.    Yes [provider]  dexamethasone (DECADRON) 4 MG tablet Take 1 tablet (4 mg total) by mouth daily. Take 1 tablet once daily for 3 to 5 days after chemo 10/29/19  Yes Alla Feeling, NP  diclofenac (VOLTAREN) 75 MG EC tablet Take 1 tablet (75 mg total) by mouth 2 (two) times daily as needed. Patient taking differently: Take 75 mg by mouth 2 (two) times daily. 12/12/19  Yes Shelly Coss, MD  dicyclomine (BENTYL) 10 MG capsule Take 1 capsule (10 mg total) by mouth every 6 (six) hours as needed for spasms. 02/08/20  Yes Truitt Merle, MD  Dietary Management Product (RHEUMATE) CAPS Take 1 capsule by mouth daily.    Yes [provider]  diphenoxylate-atropine (LOMOTIL) 2.5-0.025 MG tablet Take 2 tablets by mouth 4 (four) times daily as needed for diarrhea or loose stools (use 2nd, if Imodium doesn't work. Max dose: 8 tablets/day.). 02/08/20  Yes Truitt Merle, MD  dronabinol (MARINOL) 2.5 MG capsule Take 1 capsule (2.5 mg total) by mouth 2 (two) times daily before a meal. Patient taking differently: Take 2.5 mg by mouth 2 (two) times daily as needed (nausea). 11/14/19  Yes Truitt Merle, MD  enoxaparin (LOVENOX) 80 MG/0.8ML injection Inject 0.8 mLs (80  mg total) into the skin every 12 (twelve) hours. 01/09/20 04/08/20 Yes Alla Feeling, NP  ferrous sulfate 325 (65 FE) MG tablet Take 325 mg by mouth daily  with breakfast.   Yes [provider]  fluticasone (FLONASE) 50 MCG/ACT nasal spray Place 2 sprays into both nostrils daily. Patient taking differently: Place 2 sprays into both nostrils daily as needed for allergies. 04/29/16  Yes Jeffery, Domingo Mend, PA  hydroxychloroquine (PLAQUENIL) 200 MG tablet Take 400 mg by mouth daily.  06/17/14  Yes [provider]  loperamide (IMODIUM) 2 MG capsule Take 2 capsules (4 mg total) by mouth 2 (two) times daily as needed for diarrhea or loose stools (Use 1st). 11/09/19  Yes Hongalgi, Lenis Dickinson, MD  LORazepam (ATIVAN) 0.5 MG tablet Take 1 tablet (0.5 mg total) by mouth every 8 (eight) hours as needed for anxiety (and anticiaptory n/v. DO NOT TAKE WITH XANAX). 11/21/19  Yes Alla Feeling, NP  losartan-hydrochlorothiazide (HYZAAR) 50-12.5 MG tablet Take 1 tablet by mouth daily. 01/21/20  Yes [provider]  metoprolol succinate (TOPROL-XL) 50 MG 24 hr tablet Take 50 mg by mouth daily.    Yes [provider]  mirtazapine (REMERON) 7.5 MG tablet TAKE 1 TABLET BY MOUTH AT BEDTIME. Patient taking differently: Take 7.5 mg by mouth at bedtime as needed (sleep). 02/04/20  Yes Alla Feeling, NP  ondansetron (ZOFRAN) 8 MG tablet Take 1 tablet (8 mg total) by mouth every 8 (eight) hours as needed for nausea or vomiting. 12/24/19  Yes Truitt Merle, MD  oxyCODONE (OXY IR/ROXICODONE) 5 MG immediate release tablet Take 1 tablet (5 mg total) by mouth every 6 (six) hours as needed for severe pain. 01/24/20  Yes Truddie Hidden, MD  senna-docusate (SENOKOT-S) 8.6-50 MG tablet Take 1 tablet by mouth 2 (two) times daily. Patient taking differently: Take 1 tablet by mouth 2 (two) times daily as needed for mild constipation. 12/12/19  Yes Shelly Coss, MD  sucralfate (CARAFATE) 1 GM/10ML suspension  Take 10 mLs (1 g total) by mouth 4 (four) times daily -  with meals and at bedtime. 12/12/19  Yes Shelly Coss, MD  topiramate (TOPAMAX) 50 MG tablet Take 1 tablet (50 mg total) by mouth at bedtime. Patient taking differently: Take 50 mg by mouth daily as needed (migraines). 11/09/19  Yes Hongalgi, Lenis Dickinson, MD  lipase/protease/amylase 24000-76000 units CPEP Take 1 capsule (24,000 Units total) by mouth 3 (three) times daily with meals. Patient not taking: Reported on 02/17/2020 12/12/19   Shelly Coss, MD  OLANZapine (ZYPREXA) 7.5 MG tablet Take 1 tablet (7.5 mg total) by mouth at bedtime. Patient not taking: No sig reported 02/14/20   Truitt Merle, MD  sterile water SOLN with amino acids 10 % SOLN 1.3 g/kg, dextrose 70 % SOLN 20 % Inject into the vein continuous. Pt on a 12 hour schedule. Last bag 01-31-20. Per husband. [facility].    [provider]  prochlorperazine (COMPAZINE) 10 MG tablet Take 1 tablet (10 mg total) by mouth every 6 (six) hours as needed (Nausea or vomiting). Patient taking differently: Take 10 mg by mouth every 6 (six) hours as needed for nausea or vomiting.  10/18/19 12/18/19  Truitt Merle, MD     Allergies:     Allergies  Allergen Reactions  . Cinnamon Other (See Comments)    Other reaction(s): Other (See Comments) Unknown  On allergy test Unknown  On allergy test  . Peanut-Containing Drug Products Hives  . Prednisone Other (See Comments)    Interacts with another medicine she is taking  . Corn Oil Hives  . Corn-Containing Products Hives  . Other Rash  Red grapefruit and naval oranges- lips tingling and facial rash Potatoes, tomatoes, garlic, oregano/basil caused headache Other reaction(s): migratory headache     Physical Exam:   Vitals  Blood pressure 128/89, pulse 99, temperature 98.4 F (36.9 C), temperature source Rectal, resp. rate 20, SpO2 100 %.  1.  General: Appears in no acute distress  2.  Psychiatric: Alert, oriented x3, intact insight and judgment  3. Neurologic: Cranial nerves II through XII grossly intact, no focal deficit noted  4. HEENMT:  Atraumatic normocephalic, extraocular muscles are intact  5. Respiratory : Clear to auscultation bilaterally, no wheezing or crackles auscultated  6. Cardiovascular : S1-S2, regular, no murmur auscultated  7. Gastrointestinal:  Abdomen is soft, tender to palpation, nondistended , no organomegaly elicited     Data Review:    CBC Recent Labs  Lab 02/17/20 1236  WBC 9.3  HGB 9.4*  HCT 30.4*  PLT 189  MCV 101.3*  MCH 31.3  MCHC 30.9  RDW 24.1*  LYMPHSABS 1.9  MONOABS 1.3*  EOSABS 0.1  BASOSABS 0.1   ------------------------------------------------------------------------------------------------------------------  Results for orders placed or performed during the hospital encounter of 02/17/20 (from the past 48 hour(s))  Comprehensive metabolic panel     Status: Abnormal   Collection Time: 02/17/20 12:36 PM  Result Value Ref Range   Sodium 135 135 - 145 mmol/L   Potassium 4.3 3.5 - 5.1 mmol/L   Chloride 101 98 - 111 mmol/L   CO2 20 (L) 22 - 32 mmol/L   Glucose, Bld 134 (H) 70 - 99 mg/dL    Comment: Glucose reference range applies only to samples taken after fasting for at least 8 hours.   BUN 21 (H) 6 - 20 mg/dL   Creatinine, Ser 0.75 0.44 - 1.00 mg/dL   Calcium 8.4 (L) 8.9 - 10.3 mg/dL   Total Protein 7.8 6.5 - 8.1 g/dL   Albumin 2.1 (L) 3.5 - 5.0 g/dL   AST 150 (H) 15 - 41 U/L   ALT 78 (H) 0 - 44 U/L   Alkaline Phosphatase 241 (H) 38 - 126 U/L   Total Bilirubin 4.9 (H) 0.3 - 1.2 mg/dL   GFR, Estimated >60 >60 mL/min    Comment: (NOTE) Calculated using the CKD-EPI Creatinine Equation (2021)    Anion gap 14 5 - 15    Comment: Performed at [facility], [place] [address] [place], [place] [address]  CBC WITH DIFFERENTIAL     Status: Abnormal   Collection Time: 02/17/20 12:36 PM   Result Value Ref Range   WBC 9.3 4.0 - 10.5 K/uL   RBC 3.00 (L) 3.87 - 5.11 MIL/uL   Hemoglobin 9.4 (L) 12.0 - 15.0 g/dL   HCT 30.4 (L) 36.0 - 46.0 %   MCV 101.3 (H) 80.0 - 100.0 fL   MCH 31.3 26.0 - 34.0 pg   MCHC 30.9 30.0 - 36.0 g/dL   RDW 24.1 (H) 11.5 - 15.5 %   Platelets 189 150 - 400 K/uL   nRBC 1.6 (H) 0.0 - 0.2 %   Neutrophils Relative % 63 %   Neutro Abs 5.9 1.7 - 7.7 K/uL   Lymphocytes Relative 20 %   Lymphs Abs 1.9 0.7 - 4.0 K/uL   Monocytes Relative 14 %   Monocytes Absolute 1.3 (H) 0.1 - 1.0 K/uL   Eosinophils Relative 1 %   Eosinophils Absolute 0.1 0.0 - 0.5 K/uL   Basophils Relative 1 %   Basophils Absolute 0.1 0.0 - 0.1  K/uL   Immature Granulocytes 1 %   Abs Immature Granulocytes 0.08 (H) 0.00 - 0.07 K/uL   Reactive, Benign Lymphocytes PRESENT    Polychromasia PRESENT     Comment: Performed at [facility], [place] [address] [place], [place] [address]  Protime-INR     Status: Abnormal   Collection Time: 02/17/20 12:36 PM  Result Value Ref Range   Prothrombin Time 16.4 (H) 11.4 - 15.2 seconds   INR 1.4 (H) 0.8 - 1.2    Comment: (NOTE) INR goal varies based on device and disease states. Performed at [facility], [place] [address] [place], [place] [address]   APTT     Status: Abnormal   Collection Time: 02/17/20 12:36 PM  Result Value Ref Range   aPTT 47 (H) 24 - 36 seconds    Comment:        IF BASELINE aPTT IS ELEVATED, SUGGEST PATIENT RISK ASSESSMENT BE USED TO DETERMINE APPROPRIATE ANTICOAGULANT THERAPY. Performed at [facility], [place] [address] [place], [place] [address]   Troponin I (High Sensitivity)     Status: None   Collection Time: 02/17/20 12:36 PM  Result Value Ref Range   Troponin I (High Sensitivity) 14 <18 ng/L    Comment: (NOTE) Elevated high sensitivity troponin I (hsTnI) values and significant  changes across serial measurements may suggest ACS but many other  chronic and  acute conditions are known to elevate hsTnI results.  Refer to the "Links" section for chest pain algorithms and additional  guidance. Performed at [facility], [place] [address] [place], [place] [address]   I-Stat beta hCG blood, ED     Status: Abnormal   Collection Time: 02/17/20  1:12 PM  Result Value Ref Range   I-stat hCG, quantitative 53.9 (H) <5 mIU/mL   Comment 3            Comment:   GEST. AGE      CONC.  (mIU/mL)   <=1 WEEK        5 - 50     2 WEEKS       50 - 500     3 WEEKS       100 - 10,000     4 WEEKS     1,000 - 30,000        FEMALE AND NON-PREGNANT FEMALE:     LESS THAN 5 mIU/mL   Lactic acid, plasma     Status: Abnormal   Collection Time: 02/17/20  1:15 PM  Result Value Ref Range   Lactic Acid, Venous 4.2 (HH) 0.5 - 1.9 mmol/L    Comment: CRITICAL VALUE NOTED.  VALUE IS CONSISTENT WITH PREVIOUSLY REPORTED AND CALLED VALUE. Performed at [facility], [place] [address], [place], [place] [address]   Lactic acid, plasma     Status: Abnormal   Collection Time: 02/17/20  3:00 PM  Result Value Ref Range   Lactic Acid, Venous 3.1 (HH) 0.5 - 1.9 mmol/L    Comment: CRITICAL VALUE NOTED.  VALUE IS CONSISTENT WITH PREVIOUSLY REPORTED AND CALLED VALUE. Performed at [facility], [place] [address] [place], [place] [address]     Chemistries  Recent Labs  Lab 02/17/20 1236  NA 135  K 4.3  CL 101  CO2 20*  GLUCOSE 134*  BUN 21*  CREATININE 0.75  CALCIUM 8.4*  AST 150*  ALT 78*  ALKPHOS 241*  BILITOT 4.9*   ------------------------------------------------------------------------------------------------------------------  ------------------------------------------------------------------------------------------------------------------  GFR: Estimated Creatinine Clearance: 83.9 mL/min (by C-G formula based on SCr of 0.75 mg/dL). Liver Function Tests: Recent Labs  Lab 02/17/20 1236  AST 150*  ALT 78*  ALKPHOS  241*  BILITOT 4.9*  PROT 7.8  ALBUMIN 2.1*   No results for input(s): LIPASE, AMYLASE in the last 168 hours. No results for input(s): AMMONIA in the last 168 hours. Coagulation Profile: Recent Labs  Lab 02/17/20 1236  INR 1.4*   Cardiac Enzymes: No results for input(s): CKTOTAL, CKMB, CKMBINDEX, TROPONINI in the last 168 hours. BNP (last 3 results) No results for input(s): PROBNP in the last 8760 hours. HbA1C: No results for input(s): HGBA1C in the last 72 hours. CBG: No results for input(s): GLUCAP in the last 168 hours. Lipid Profile: No results for input(s): CHOL, HDL, LDLCALC, TRIG, CHOLHDL, LDLDIRECT in the last 72 hours. Thyroid Function Tests: No results for input(s): TSH, T4TOTAL, FREET4, T3FREE, THYROIDAB in the last 72 hours. Anemia Panel: No results for input(s): VITAMINB12, FOLATE, FERRITIN, TIBC, IRON, RETICCTPCT in the last 72 hours.  --------------------------------------------------------------------------------------------------------------- Urine analysis:    Component Value Date/Time   COLORURINE AMBER (A) 02/01/2020 0200   APPEARANCEUR CLEAR 02/01/2020 0200   LABSPEC 1.016 02/01/2020 0200   PHURINE 9.0 (H) 02/01/2020 0200   GLUCOSEU NEGATIVE 02/01/2020 0200   HGBUR NEGATIVE 02/01/2020 0200   BILIRUBINUR NEGATIVE 02/01/2020 0200   KETONESUR NEGATIVE 02/01/2020 0200   PROTEINUR NEGATIVE 02/01/2020 0200   NITRITE NEGATIVE 02/01/2020 0200   LEUKOCYTESUR NEGATIVE 02/01/2020 0200      Imaging Results:    CT ABDOMEN PELVIS W CONTRAST  Result Date: 02/17/2020 CLINICAL DATA:  Abdominal pain, metastatic pancreatic cancer, COVID positive EXAM: CT ABDOMEN AND PELVIS WITH CONTRAST TECHNIQUE: Multidetector CT imaging of the abdomen and pelvis was performed using the standard protocol following bolus administration of intravenous contrast. CONTRAST:  153mL OMNIPAQUE IOHEXOL 300 MG/ML  SOLN COMPARISON:  02/06/2020 FINDINGS: Lower chest: Scarring/atelectasis in  the lingula and left lower lobe. Hepatobiliary: Innumerable hepatic metastases, similar to recent CT. Gallbladder is unremarkable, noting mild gallbladder wall edema, similar. No intrahepatic or extrahepatic ductal dilatation. Pancreas: 3.9 x 2.9 cm mass in the pancreatic head (series 2/image 46), similar, corresponding to the patient's known pancreatic adenocarcinoma. Atrophy of the pancreatic body/tail with ductal dilatation. Spleen: Within normal limits. Adrenals/Urinary Tract: Adrenal glands are within normal limits. Segmental/wedge-shaped hypoperfusion involving the posterolateral right lower kidney (series 2/images 51-52). Associated differential enhancement involving the posterior left lower kidney (series 7/image 43). These findings are nonspecific but are most suggestive of pyelonephritis. No hydronephrosis. Bladder is within normal limits. Stomach/Bowel: Stomach is within normal limits. No evidence of bowel obstruction. Appendix is not discretely visualized. No colonic wall thickening or inflammatory changes. Vascular/Lymphatic: No evidence of abdominal aortic aneurysm. Stable DVT in the left common femoral vein (series 2/image 93). 14 mm short axis portacaval node (series 2/image 41), unchanged. No suspicious pelvic lymphadenopathy. Reproductive: Status post hysterectomy. No adnexal masses. Other: Small to moderate abdominopelvic ascites. Associated mild pelvic peritoneal thickening. Musculoskeletal: Mild degenerative changes at L5-S1. IMPRESSION: Mildly heterogeneous perfusion involving the bilateral kidneys, nonspecific but most suggestive of pyelonephritis. Otherwise, no interval change from recent CT. Pancreatic adenocarcinoma. Innumerable hepatic metastases. Small upper abdominal nodal metastasis. Small to moderate abdominopelvic ascites with mild peritoneal thickening, likely malignant. Stable DVT in the left common femoral vein. Electronically Signed   By: Julian Hy M.D.   On: 02/17/2020  16:18   DG Chest Port 1 View  Result Date: 02/17/2020 CLINICAL  DATA:  Weakness and dehydration. Recent history of COVID-19. EXAM: PORTABLE CHEST 1 VIEW COMPARISON:  Chest radiograph 02/01/2020. FINDINGS: Port-A-Cath tip projects over the superior vena cava. Monitoring leads overlie the patient. Stable cardiac and mediastinal contours. Low lung volumes with bibasilar opacities favored represent atelectasis. No pleural effusion or pneumothorax. IMPRESSION: Low lung volumes with bibasilar atelectasis. Electronically Signed   By: Lovey Newcomer M.D.   On: 02/17/2020 13:29    My personal review of EKG: Rhythm NSR, no ST-T changes   Assessment & Plan:    Active Problems:   Severe sepsis ([place])   1. Severe sepsis-patient presented with hypotension, tachycardia, tachypnea, initial lactic acid 4.2.  CT abdomen pelvis shows possible bilateral pyonephritis.  Patient will be empirically started on vancomycin and cefepime.  She received IV fluid bolus as per sepsis protocol 30 cc/kg.  Blood pressure is stabilized.  Continue LR at 125 mill per hour.  Follow blood and urine culture results. 2. Recent COVID infection-patient was found to be COVID-positive on 02/01/2020, she was treated with remdesivir for 3 days.  Did not require steroids.  She was not hypoxemic.  She was recommended 10 days of isolation at discharge on 02/07/2020.  Continue airborne isolation for 1 more day.  She is completely asymptomatic.  Chest x-ray is normal.  Would not treat for COVID-19 infection at this time. 3. Intractable nausea and vomiting-patient has underlying pancreatic cancer, end-stage.  Chemotherapy has been stopped as patient has been intolerant of chemotherapy.  We will start Zofran as needed for nausea and vomiting.  We will keep patient NPO.  Patient uses TPN at night at home.  Will hold TPN at this time. 4. 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  Continue Lovenox 80 mg SQ twice daily. 5. History of lupus-patient is followed by rheumatology Dr. Wyline Copas as outpatient.  She is being treated with weekly methotrexate injections, Plaquenil and  We will hold Plaquenil, diclofenac at this time. 6. Hypertension-hold Hyzaar, metoprolol due to hypotension. 7. Pancreatic carcinoma-patient has history of pancreatic adenocarcinoma in the head with metastasis to liver.  Patient was started on chemotherapy however it was stopped due to poor toleration to chemotherapy.  Oncology does not recommend further chemotherapy and as per oncology patient has very short life expectancy and poor prognosis.    DVT Prophylaxis-   Lovenox   AM Labs Ordered, also please review Full Orders  Family Communication: Admission, patients condition and plan of care including tests being ordered have been discussed with the patient who indicate understanding and agree with the plan and Code Status.  Code Status: Full code  Admission status: Inpatient :The appropriate admission status for this patient is INPATIENT. Inpatient status is judged to be reasonable and necessary in order to provide the required intensity of service to ensure the patient's safety. The patient's presenting symptoms, physical exam findings, and initial radiographic and laboratory data in the context of their chronic comorbidities is felt to place them at high risk for further clinical deterioration. Furthermore, it is not anticipated that the patient will be medically stable for discharge from the hospital within 2 midnights of admission. The following factors support the admission status of inpatient.     The patient's presenting symptoms include nausea and vomiting The worrisome physical exam findings include abdominal tenderness The initial radiographic and laboratory data are worrisome because of severe sepsis. The chronic co-morbidities include pancreatic  cancer.       * I  certify that at the point of admission it is my clinical judgment that the patient will require inpatient hospital care spanning beyond 2 midnights from the point of admission due to high intensity of service, high risk for further deterioration and high frequency of surveillance required.*  Time spent in minutes : 60 minutes   Noemi Ishmael S Nazire Fruth M.D

## 2020-02-17 NOTE — Progress Notes (Signed)
A consult was received from an ED provider for cefepime and vancomycin per pharmacy dosing.  The patient's profile has been reviewed for ht/wt/allergies/indication/available labs.   Cefepime 2 g IV x 1 already entered by provider.  A one time order has been placed for vancomycin 2 g IV.  Further antibiotics/pharmacy consults should be ordered by admitting physician if indicated.                       Thank you, Lagena Strand P. Legrand Como, PharmD, [place] Please utilize Amion for appropriate phone number to reach the unit pharmacist ([place]) 02/17/2020 1:20 PM

## 2020-02-17 NOTE — ED Notes (Signed)
Attempted report x2, floor not ready for patient per Erin. Charge Nurse Vassie Loll) made aware.

## 2020-02-17 NOTE — ED Notes (Signed)
purewick placed on pt. Pt encouraged to urinate

## 2020-02-17 NOTE — ED Provider Notes (Signed)
Received patient as a handoff at shift change from [facility], [place].  In short, patient is a 60 year old female with PMH significant for pancreatic cancer on chemotherapy, lupus, asthma, and recent COVID-19 infection who presented to the ED with complaints of acute onset severe abdominal pain with intractable nausea and emesis.  Patient is also endorsing generalized body aches.  Generalized abdominal tenderness and guarding per exam of handoff provider.  Patient is ill-appearing and given her tachycardia, tachypnea, and toxic-appearance, code sepsis was initiated.  Fluids and broad-spectrum antibiotics were started, source remains unclear.  CT abdomen pelvis pending at time of handoff.  Laboratory work-up notable for lactic acid elevated to 4.2.  Patient was also evaluated by Dr. Albertine Patricia who agrees that patient will ultimately require medical admission regardless of CT findings given her intractable pain, nausea, and emesis in setting of dehydration and elevated lactic acid.  Will first consult with oncology before reaching out to hospitalist services.    Physical Exam  BP 128/87   Pulse (!) 105   Temp 98.4 F (36.9 C) (Rectal)   Resp 17   SpO2 100%   Physical Exam Vitals and nursing note reviewed. Exam conducted with a chaperone present.  Constitutional:      Appearance: Normal appearance.  HENT:     Head: Normocephalic and atraumatic.  Eyes:     General: No scleral icterus.    Conjunctiva/sclera: Conjunctivae normal.  Cardiovascular:     Rate and Rhythm: Normal rate and regular rhythm.     Pulses: Normal pulses.  Pulmonary:     Effort: Pulmonary effort is normal. No respiratory distress.     Breath sounds: Normal breath sounds. No wheezing or rales.  Abdominal:     General: Abdomen is flat. There is no distension.     Palpations: Abdomen is soft.     Tenderness: There is abdominal tenderness. There is right CVA tenderness. There is no left CVA tenderness or guarding.      Comments: Soft, nondistended.  TTP generalized, but most notably in periumbilical region.  Positive right-sided CVAT.  No left-sided CVAT.   Musculoskeletal:        General: Normal range of motion.     Cervical back: Normal range of motion.  Skin:    General: Skin is dry.  Neurological:     Mental Status: She is alert and oriented to person, place, and time.     GCS: GCS eye subscore is 4. GCS verbal subscore is 5. GCS motor subscore is 6.  Psychiatric:        Mood and Affect: Mood normal.        Behavior: Behavior normal.        Thought Content: Thought content normal.    ED Course/Procedures   Clinical Course as of 02/17/20 1733  Sun Feb 17, 2020  1517 Lactic Acid, Venous(!!): 4.2 [GG]  1713 I spoke with Dr. Lindi Adie, oncology, who asked that I add him and Dr. Burr Medico to the treatment team and they will round on her once admitted to hospitalist services. [GG]  1716 I spoke with Dr. Darrick Meigs who will see and admit patient. [GG]    Clinical Course User Index [GG] Corena Herter, PA-C    Procedures Results for orders placed or performed during the hospital encounter of 02/17/20  Lactic acid, plasma  Result Value Ref Range   Lactic Acid, Venous 4.2 (HH) 0.5 - 1.9 mmol/L  Lactic acid, plasma  Result Value Ref Range   Lactic  Acid, Venous 3.1 (HH) 0.5 - 1.9 mmol/L  Comprehensive metabolic panel  Result Value Ref Range   Sodium 135 135 - 145 mmol/L   Potassium 4.3 3.5 - 5.1 mmol/L   Chloride 101 98 - 111 mmol/L   CO2 20 (L) 22 - 32 mmol/L   Glucose, Bld 134 (H) 70 - 99 mg/dL   BUN 21 (H) 6 - 20 mg/dL   Creatinine, Ser 0.75 0.44 - 1.00 mg/dL   Calcium 8.4 (L) 8.9 - 10.3 mg/dL   Total Protein 7.8 6.5 - 8.1 g/dL   Albumin 2.1 (L) 3.5 - 5.0 g/dL   AST 150 (H) 15 - 41 U/L   ALT 78 (H) 0 - 44 U/L   Alkaline Phosphatase 241 (H) 38 - 126 U/L   Total Bilirubin 4.9 (H) 0.3 - 1.2 mg/dL   GFR, Estimated >60 >60 mL/min   Anion gap 14 5 - 15  CBC WITH DIFFERENTIAL  Result Value Ref Range    WBC 9.3 4.0 - 10.5 K/uL   RBC 3.00 (L) 3.87 - 5.11 MIL/uL   Hemoglobin 9.4 (L) 12.0 - 15.0 g/dL   HCT 30.4 (L) 36.0 - 46.0 %   MCV 101.3 (H) 80.0 - 100.0 fL   MCH 31.3 26.0 - 34.0 pg   MCHC 30.9 30.0 - 36.0 g/dL   RDW 24.1 (H) 11.5 - 15.5 %   Platelets 189 150 - 400 K/uL   nRBC 1.6 (H) 0.0 - 0.2 %   Neutrophils Relative % 63 %   Neutro Abs 5.9 1.7 - 7.7 K/uL   Lymphocytes Relative 20 %   Lymphs Abs 1.9 0.7 - 4.0 K/uL   Monocytes Relative 14 %   Monocytes Absolute 1.3 (H) 0.1 - 1.0 K/uL   Eosinophils Relative 1 %   Eosinophils Absolute 0.1 0.0 - 0.5 K/uL   Basophils Relative 1 %   Basophils Absolute 0.1 0.0 - 0.1 K/uL   Immature Granulocytes 1 %   Abs Immature Granulocytes 0.08 (H) 0.00 - 0.07 K/uL   Reactive, Benign Lymphocytes PRESENT    Polychromasia PRESENT   Protime-INR  Result Value Ref Range   Prothrombin Time 16.4 (H) 11.4 - 15.2 seconds   INR 1.4 (H) 0.8 - 1.2  APTT  Result Value Ref Range   aPTT 47 (H) 24 - 36 seconds  I-Stat beta hCG blood, ED  Result Value Ref Range   I-stat hCG, quantitative 53.9 (H) <5 mIU/mL   Comment 3          Troponin I (High Sensitivity)  Result Value Ref Range   Troponin I (High Sensitivity) 14 <18 ng/L   CT ANGIO CHEST PE W OR WO CONTRAST  Result Date: 02/01/2020 CLINICAL DATA:  Increasing right rib pain for 1 day. Nausea and vomiting EXAM: CT ANGIOGRAPHY CHEST WITH CONTRAST TECHNIQUE: Multidetector CT imaging of the chest was performed using the standard protocol during bolus administration of intravenous contrast. Multiplanar CT image reconstructions and MIPs were obtained to evaluate the vascular anatomy. CONTRAST:  160mL OMNIPAQUE IOHEXOL 350 MG/ML SOLN COMPARISON:  01/11/2020 FINDINGS: Cardiovascular: Normal heart size. No pericardial effusion. No acute aortic finding. Extensive motion and streak artifact. Pulmonary emboli are again seen at the bifurcation of the right main pulmonary artery, peripheral within the vessel and  nonacute. More peripheral pulmonary arteries are not reliably characterized due to the degree of artifact. No definite progression from 01/11/2020. Mediastinum/Nodes: Mild enlargement of bilateral axillary lymph nodes which may be related to history of  connective tissue disease. No mediastinal adenopathy. Lungs/Pleura: Reportedly patient is COVID positive per the chart. There is no edema, consolidation, effusion, or pneumothorax. Small opacity at the lingula which has a scar-like appearance, also seen on prior. No suspicious pulmonary nodules. Upper Abdomen: Reported separately Musculoskeletal: No acute finding. Review of the MIP images confirms the above findings. IMPRESSION: 1. Very limited CTA due to the extent of motion and streak artifact. Persisting nonacute right-sided pulmonary emboli as seen on 01/11/2020 chest CTA. No clear progression of emboli. DVT is present at the left common femoral vein on prior abdominal CT. 2. COVID positivity without pneumonia. Electronically Signed   By: Monte Fantasia M.D.   On: 02/01/2020 04:53   CT ABDOMEN PELVIS W CONTRAST  Result Date: 02/17/2020 CLINICAL DATA:  Abdominal pain, metastatic pancreatic cancer, COVID positive EXAM: CT ABDOMEN AND PELVIS WITH CONTRAST TECHNIQUE: Multidetector CT imaging of the abdomen and pelvis was performed using the standard protocol following bolus administration of intravenous contrast. CONTRAST:  146mL OMNIPAQUE IOHEXOL 300 MG/ML  SOLN COMPARISON:  02/06/2020 FINDINGS: Lower chest: Scarring/atelectasis in the lingula and left lower lobe. Hepatobiliary: Innumerable hepatic metastases, similar to recent CT. Gallbladder is unremarkable, noting mild gallbladder wall edema, similar. No intrahepatic or extrahepatic ductal dilatation. Pancreas: 3.9 x 2.9 cm mass in the pancreatic head (series 2/image 46), similar, corresponding to the patient's known pancreatic adenocarcinoma. Atrophy of the pancreatic body/tail with ductal dilatation.  Spleen: Within normal limits. Adrenals/Urinary Tract: Adrenal glands are within normal limits. Segmental/wedge-shaped hypoperfusion involving the posterolateral right lower kidney (series 2/images 51-52). Associated differential enhancement involving the posterior left lower kidney (series 7/image 43). These findings are nonspecific but are most suggestive of pyelonephritis. No hydronephrosis. Bladder is within normal limits. Stomach/Bowel: Stomach is within normal limits. No evidence of bowel obstruction. Appendix is not discretely visualized. No colonic wall thickening or inflammatory changes. Vascular/Lymphatic: No evidence of abdominal aortic aneurysm. Stable DVT in the left common femoral vein (series 2/image 93). 14 mm short axis portacaval node (series 2/image 41), unchanged. No suspicious pelvic lymphadenopathy. Reproductive: Status post hysterectomy. No adnexal masses. Other: Small to moderate abdominopelvic ascites. Associated mild pelvic peritoneal thickening. Musculoskeletal: Mild degenerative changes at L5-S1. IMPRESSION: Mildly heterogeneous perfusion involving the bilateral kidneys, nonspecific but most suggestive of pyelonephritis. Otherwise, no interval change from recent CT. Pancreatic adenocarcinoma. Innumerable hepatic metastases. Small upper abdominal nodal metastasis. Small to moderate abdominopelvic ascites with mild peritoneal thickening, likely malignant. Stable DVT in the left common femoral vein. Electronically Signed   By: Julian Hy M.D.   On: 02/17/2020 16:18   CT ABDOMEN PELVIS W CONTRAST  Result Date: 02/06/2020 CLINICAL DATA:  Abdominal pain and fever. Metastatic pancreatic adenocarcinoma. EXAM: CT ABDOMEN AND PELVIS WITH CONTRAST TECHNIQUE: Multidetector CT imaging of the abdomen and pelvis was performed using the standard protocol following bolus administration of intravenous contrast. CONTRAST:  112mL OMNIPAQUE IOHEXOL 300 MG/ML  SOLN COMPARISON:  CT 5 days ago  02/01/2020 FINDINGS: Lower chest: Stable lingular opacity. Slight increase in bibasilar atelectasis. No pleural fluid. Hepatobiliary: Innumerable hepatic metastasis. No significant change in the short interim. Layering sludge in the gallbladder. Mild gallbladder wall thickening. There is no biliary obstruction or discrete stone. Pancreas: Pancreatic head mass measures 3.4 cm prom distal pancreatic ductal dilatation with pancreatic atrophy unchanged. No adjacent inflammatory change. Spleen: Normal in size without focal abnormality. Adrenals/Urinary Tract: No adrenal nodule. The previous low-density band in the lower left kidney has improved and near completely resolved. There is homogeneous renal enhancement  with symmetric excretion on delayed phase imaging. Unremarkable urinary bladder. Stomach/Bowel: Nondistended stomach. Pancreatic mass abuts the distal gastric body without gastric outlet obstruction. Small amount of enteric contrast is seen involving distal small bowel and throughout the colon. No obstruction. Suggestion of small bowel thickening and enhancement involving short segment in the left lower quadrant, series 2, image 71. There is improved submucosal low-density in the small bowel from prior. Submucosal fatty deposition of the ascending colon is again seen. Slight increase in submucosal fatty deposition involving the transverse and descending colon. Vascular/Lymphatic: Left femoral DVT again seen. No progression from prior. No portal vein thrombosis. Normal caliber abdominal aorta. No acute vascular findings. Enlarged periportal node, largest measuring 14 mm, unchanged. No progressive adenopathy in the short interim. Reproductive: Hysterectomy.  Ovaries not delineated. Other: Small volume abdominopelvic ascites, which has slightly progressed from prior exam. Peritoneal thickening in the right pericolic gutter is again seen. Mild generalized omental and mesenteric edema. Patchy subcutaneous densities in  the anterior abdominal wall with foci of air typical of medication injection sites. Musculoskeletal: No focal bone lesion or acute osseous abnormality. IMPRESSION: 1. Short segment loop of small bowel with wall thickening and mild enhancement in the left lower quadrant may represent focal enteritis. 2. Pancreatic head mass with innumerable hepatic metastasis. Peritoneal thickening in the right pericolic gutter is again seen. No significant change from CT 5 days ago. 3. Small volume abdominopelvic ascites, slightly progressed from prior exam. 4. Improved bandlike hypodensity in the lower left kidney, near completely resolved. 5. Gallbladder sludge. Gallbladder wall thickening or small amount of pericholecystic fluid, likely reactive. 6. Left femoral DVT again seen. Electronically Signed   By: Keith Rake M.D.   On: 02/06/2020 19:48   CT ABDOMEN PELVIS W CONTRAST  Result Date: 02/01/2020 CLINICAL DATA:  Acute right flank pain.  Pancreatic cancer EXAM: CT ABDOMEN AND PELVIS WITH CONTRAST TECHNIQUE: Multidetector CT imaging of the abdomen and pelvis was performed using the standard protocol following bolus administration of intravenous contrast. CONTRAST:  172mL OMNIPAQUE IOHEXOL 350 MG/ML SOLN COMPARISON:  01/24/2020 FINDINGS: Lower chest:  Reported separately Hepatobiliary: Innumerable metastases throughout the liver. Gallbladder sludge or calculi is likely. No evidence of acute cholecystitis. Pancreas: Mass at the pancreatic head with marked proximal ductal dilatation and atrophy. The mass measures approximately 3 cm. No acute superimposed inflammation. Spleen: Unremarkable. Adrenals/Urinary Tract: Negative adrenals. There is a band of low-density in the posterior and lower left kidney. No hydronephrosis. Unremarkable bladder. Stomach/Bowel: Extensive submucosal low-density within small bowel proximal colon which appears fairly well-defined and has the appearance of fat deposition, also seen on prior but more  prominent today. No bowel obstruction or clear bowel edema. Gastric outlet is distorted by the pancreatic mass, but no gastric dilatation. The appendix is not well visualized. No pericecal inflammation. Vascular/Lymphatic: DVT in the left common femoral vein which is resolved by the level of the common femoral bifurcation. This finding will be called in conjunction with chest CT findings. Prominent lymph nodes in the deep liver drainage about the pancreatic head, similar and likely metastatic. Reproductive:Hysterectomy. Other: Small volume ascites in the pelvis. Minimal interloop fluid is also seen. No discrete peritoneal nodularity but there is likely mild thickening of the peritoneum the upper right pericolic gutter. Musculoskeletal: No acute abnormalities. IMPRESSION: 1. DVT in the left common femoral vein, nonobstructive. 2. Band of hypoenhancement in the lower left kidney, contralateral to site of symptoms. Pyelonephritis is considered, please correlate with urinalysis. 3. Known pancreas cancer with  extensive liver metastases. 4. Small volume ascites with possible peritoneal thickening, concerning for early peritoneal metastatic disease. 5. Gallbladder sludge or calculi. No evidence of acute cholecystitis. Electronically Signed   By: Monte Fantasia M.D.   On: 02/01/2020 04:47   CT Abdomen Pelvis W Contrast  Result Date: 01/24/2020 CLINICAL DATA:  Abdominal pain for the past 2 days. Constipation. Ongoing chemotherapy for pancreatic cancer. EXAM: CT ABDOMEN AND PELVIS WITH CONTRAST TECHNIQUE: Multidetector CT imaging of the abdomen and pelvis was performed using the standard protocol following bolus administration of intravenous contrast. CONTRAST:  161mL OMNIPAQUE IOHEXOL 300 MG/ML  SOLN COMPARISON:  Abdomen MR dated 01/16/2020. Chest CTA dated 01/11/2020. Abdomen and pelvis CT dated 11/24/2019. FINDINGS: Lower chest: Mildly progressive linear and mass-like atelectasis at the left lung base with no  significant change in linear atelectasis or scarring at the right lung base. Hepatobiliary: Large number of masses throughout the liver without significant change since 01/16/2020. Unremarkable gallbladder. Pancreas: No significant change in the mass in the pancreas at the level of the junction of the pancreatic head and body since 01/16/2020 with associated marked dilatation of the pancreatic duct. Spleen: Unremarkable. Adrenals/Urinary Tract: Adrenal glands are unremarkable. Kidneys are normal, without renal calculi, focal lesion, or hydronephrosis. Bladder is unremarkable. Stomach/Bowel: Minimal diffuse enlargement of the appendix with mild diffuse low density wall thickening without periappendiceal soft tissue stranding. The appendix measures 9 mm in maximum diameter, similar to 11/24/2019. Unremarkable stomach, small bowel and colon. No significant stool in the colon. Vascular/Lymphatic: No significant vascular findings are present. No enlarged abdominal or pelvic lymph nodes. Reproductive: Status post hysterectomy. No adnexal masses. Other: No abdominal wall hernia or abnormality. No abdominopelvic ascites. Musculoskeletal: L5-S1 degenerative changes. IMPRESSION: 1. No acute abnormality. 2. No significant change in the known pancreatic malignancy with associated marked dilatation of the pancreatic duct. 3. Stable extensive liver metastatic disease. Electronically Signed   By: Claudie Revering M.D.   On: 01/24/2020 10:57   DG Chest Port 1 View  Result Date: 02/17/2020 CLINICAL DATA:  Weakness and dehydration. Recent history of COVID-19. EXAM: PORTABLE CHEST 1 VIEW COMPARISON:  Chest radiograph 02/01/2020. FINDINGS: Port-A-Cath tip projects over the superior vena cava. Monitoring leads overlie the patient. Stable cardiac and mediastinal contours. Low lung volumes with bibasilar opacities favored represent atelectasis. No pleural effusion or pneumothorax. IMPRESSION: Low lung volumes with bibasilar atelectasis.  Electronically Signed   By: Lovey Newcomer M.D.   On: 02/17/2020 13:29   DG Chest Port 1 View  Result Date: 02/01/2020 CLINICAL DATA:  Sepsis, right anterior chest pain, history of pancreatic cancer EXAM: PORTABLE CHEST 1 VIEW COMPARISON:  11/24/2019 FINDINGS: Single frontal view of the chest demonstrates stable left chest wall port. Cardiac silhouette is unremarkable. Lung volumes are diminished, with no acute airspace disease, effusion, or pneumothorax. No acute bony abnormalities. IMPRESSION: 1. Low lung volumes.  No acute process. Electronically Signed   By: Randa Ngo M.D.   On: 02/01/2020 01:32    MDM   I reviewed patient's medical record.  Last MRI abdomen obtained 01/16/2020 demonstrated primary pancreatic cancer with extensive metastatic burden throughout all lobes of the liver, significantly increased when compared to prior imaging.  There was also sludge noted in the gallbladder with gallbladder wall thickening.  She does appear to have slightly elevated total bilirubin today when compared to prior labs.  She has been trying to treat her at home nausea with Zofran Compazine.  She takes oxycodone intermittently for abdominal cramping.  Patient is full code as of 02/01/2020.  She's followed by Dr. Burr Medico at [facility].  On my examination, she again states that she has been having significant abdominal pain uncontrolled by her home regimen of pain control and antiemetics.  She has also been endorsing bilateral flank pain, most notably over the right side.  She denies any dysuria or increased urinary frequency.  She feels profoundly weak and dehydrated.  She has had some improvement here in the ED with fluid administration, broad-spectrum antibiotics, and fentanyl.  Her blood pressures approximately [id number] systolic, will continue with fentanyl as needed for pain.  We'll try to obtain UA and then plan for admission for plan of handoff providers.  Urine culture will be unreliable given  that antibiotics have already been initiated.  She confirms that she has been using her heparin twice daily, as directed.  I spoke with Dr. Lindi Adie, oncology, who asked that I add him and Dr. Burr Medico to the treatment team and they will round on her once admitted to hospitalist services.  I spoke with Dr. Darrick Meigs who will see and admit patient.       Corena Herter, PA-C 02/17/20 1733    Lacretia Leigh, MD 02/17/20 2242

## 2020-02-17 NOTE — ED Notes (Signed)
Cynthia Frye [phone] cell

## 2020-02-17 NOTE — ED Provider Notes (Addendum)
[facility] DEPT Provider Note   CSN: [id number] Arrival date & time: 02/17/20  1202     History Chief Complaint  Patient presents with  . Weakness  . Dehydration    Cynthia Frye is a 60 y.o. female history of pancreatic cancer on chemotherapy, obesity, lupus, asthma, migraine, recent COVID infection.  Patient arrives today for multiple complaints.  She reports that this morning she developed abdominal pain which she describes as a constant cramping sensation nonradiating of her entire abdomen no alleviating factors worsened with attempting to drink.  Whenever she drinks or attempts to eat she develops severe abdominal cramping that causes her to vomit.  She reports multiple episodes of nonbloody/nonbilious emesis today.  She also reports that over the last few days she has been experiencing nonbloody diarrhea.  She reports an aching sensation to her entire body as well that began today.   Denies recent fever, headache, vision changes, difficulty breathing, chest pain, hemoptysis, hematochezia, melena, extremity swelling/color change or any additional concerns.  Of note patient reports that she has been compliant with her Lovenox therapy  HPI     Past Medical History:  Diagnosis Date  . Allergies    peanuts, corn, beans, red grapefruit, naval oranges  . Asthma    allergy shots and medication  . Cancer [facility])    pancreatic  . Collagen vascular disease ([place])   . DDD (degenerative disc disease), lumbar   . Eustachian tube dysfunction 12/2012   rhinitis, vertigo- Dr. Wilburn Cornelia, ENT   . Fibroids   . Heart murmur    Echo 1/18: EF 55-60, no RWMA, normal diastolic function, trivial AI, PASP 32  . History of cardiac catheterization    LHC 3/18: normal coronary arteries.   . History of nuclear stress test    ETT-Myoview 2/18: EF 62, + ECG response; apical and distal septal ischemia; intermediate risk.  Marland Kitchen Hypertension   . Iron deficiency anemia   .  Lupus [facility]) 2011   Dr. Trudie Reed  . Migraine headache    trial of generic maxalt 10 mg, January 2021  . Obesity   . Seasonal allergies   . Seizure in childhood [facility])    as a child no treatment none x 30 years    Patient Active Problem List   Diagnosis Date Noted  . COVID-19 virus infection 02/01/2020  . Sepsis ([place]) 02/01/2020  . Biliary obstruction 01/15/2020  . Abdominal pain 01/04/2020  . Dysphagia 12/10/2019  . Malnutrition of moderate degree 11/26/2019  . 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 Constipation 11/25/2019  . Generalized abdominal pain   . Nausea and vomiting in adult 11/24/2019  . Dehydration 11/24/2019  . Prolonged QT interval 11/24/2019  . Emesis   . Tachycardia   . Genetic testing 11/12/2019  . Colitis 11/04/2019  . Acute deep vein thrombosis of left iliac vein (HCC) 11/04/2019  . Hypokalemia 11/04/2019  . Port-A-Cath in place 10/24/2019  . Goals of care, counseling/discussion 10/18/2019  . Pancreatic cancer ([place]) 10/02/2019  . Chronic migraine without aura without status migrainosus, not intractable 04/23/2019  . Migraine with aura and without status migrainosus, not intractable 04/23/2019  . Essential hypertension 04/29/2016  . Lupus (systemic lupus erythematosus) ([place]) 04/29/2016  . Asthma 04/29/2016  . Seasonal allergies 04/29/2016  . Abnormal stress test 04/09/2016    Past Surgical History:  Procedure Laterality Date  . ABDOMINAL HYSTERECTOMY    . BACK SURGERY  2009  . BIOPSY  12/10/2019  Procedure: BIOPSY;  Surgeon: Wilford Corner, MD;  Location: [facility] END[facility]COPY;  Service: Endoscopy;;  . COLONOSCOPY WITH PROPOFOL N/A 04/11/2012   Procedure: COLONOSCOPY WITH PROPOFOL;  Surgeon: Garlan Fair, MD;  Location: [facility] ENDOSCOPY;  Service: Endoscopy;  Laterality: N/A;  . ESOPHAGOGASTRODUODENOSCOPY (EGD) WITH PROPOFOL N/A 12/10/2019   Procedure: ESOPHAGOGASTRODUODENOSCOPY (EGD) WITH PROPOFOL;  Surgeon: Wilford Corner, MD;  Location: [facility]  ENDOSCOPY;  Service: Endoscopy;  Laterality: N/A;  . HERNIA REPAIR     as child unbilical  . Q2-I partial discectomy and laminectomy     Dr. Christella Noa  . LEFT HEART CATH AND CORONARY ANGIOGRAPHY N/A 04/09/2016   Procedure: Left Heart Cath and Coronary Angiography;  Surgeon: Nelva Bush, MD;  Location: [facility] CV LAB;  Service: Cardiovascular;  Laterality: N/A;  . MOUTH SURGERY    . PORTACATH PLACEMENT N/A 10/11/2019   Procedure: INSERTION PORT-A-CATH LEFT SUBCLAVIAN;  Surgeon: [name], MD;  Location: [facility];  Service: General;  Laterality: N/A;     OB History   No obstetric history on file.     Family History  Problem Relation Age of Onset  . Asthma Mother   . Diabetes Mother   . Hypertension Mother   . Heart attack Father 73  . CAD Father   . Stroke Maternal Aunt   . Stroke Paternal Aunt   . Stroke Paternal Uncle   . Gout Other        uncles   . Cancer Niece        paternal half sister's daughter; unknown type; unknown age diagnosed  . Ovarian cancer Neg Hx   . Breast cancer Neg Hx   . Colon cancer Neg Hx   . Migraines Neg Hx     Social History   Tobacco Use  . Smoking status: Never Smoker  . Smokeless tobacco: Never Used  Vaping Use  . Vaping Use: Never used  Substance Use Topics  . Alcohol use: No  . Drug use: No    Home Medications Prior to Admission medications   Medication Sig Start Date End Date Taking? Authorizing Provider  Cholecalciferol (VITAMIN D) 2000 units CAPS Take 2,000 Units by mouth daily.    Yes [provider]  dexamethasone (DECADRON) 4 MG tablet Take 1 tablet (4 mg total) by mouth daily. Take 1 tablet once daily for 3 to 5 days after chemo 10/29/19  Yes Alla Feeling, NP  diclofenac (VOLTAREN) 75 MG EC tablet Take 1 tablet (75 mg total) by mouth 2 (two) times daily as needed. Patient taking differently: Take 75 mg by mouth 2 (two) times daily. 12/12/19  Yes Shelly Coss, MD  dicyclomine (BENTYL) 10  MG capsule Take 1 capsule (10 mg total) by mouth every 6 (six) hours as needed for spasms. 02/08/20  Yes Truitt Merle, MD  Dietary Management Product (RHEUMATE) CAPS Take 1 capsule by mouth daily.    Yes [provider]  diphenoxylate-atropine (LOMOTIL) 2.5-0.025 MG tablet Take 2 tablets by mouth 4 (four) times daily as needed for diarrhea or loose stools (use 2nd, if Imodium doesn't work. Max dose: 8 tablets/day.). 02/08/20  Yes Truitt Merle, MD  dronabinol (MARINOL) 2.5 MG capsule Take 1 capsule (2.5 mg total) by mouth 2 (two) times daily before a meal. Patient taking differently: Take 2.5 mg by mouth 2 (two) times daily as needed (nausea). 11/14/19  Yes Truitt Merle, MD  enoxaparin (LOVENOX) 80 MG/0.8ML injection Inject 0.8 mLs (80 mg total) into the skin every  12 (twelve) hours. 01/09/20 04/08/20 Yes Alla Feeling, NP  ferrous sulfate 325 (65 FE) MG tablet Take 325 mg by mouth daily with breakfast.   Yes [provider]  fluticasone (FLONASE) 50 MCG/ACT nasal spray Place 2 sprays into both nostrils daily. Patient taking differently: Place 2 sprays into both nostrils daily as needed for allergies. 04/29/16  Yes Jeffery, Domingo Mend, PA  hydroxychloroquine (PLAQUENIL) 200 MG tablet Take 400 mg by mouth daily.  06/17/14  Yes [provider]  loperamide (IMODIUM) 2 MG capsule Take 2 capsules (4 mg total) by mouth 2 (two) times daily as needed for diarrhea or loose stools (Use 1st). 11/09/19  Yes Hongalgi, Lenis Dickinson, MD  LORazepam (ATIVAN) 0.5 MG tablet Take 1 tablet (0.5 mg total) by mouth every 8 (eight) hours as needed for anxiety (and anticiaptory n/v. DO NOT TAKE WITH XANAX). 11/21/19  Yes Alla Feeling, NP  losartan-hydrochlorothiazide (HYZAAR) 50-12.5 MG tablet Take 1 tablet by mouth daily. 01/21/20  Yes [provider]  metoprolol succinate (TOPROL-XL) 50 MG 24 hr tablet Take 50 mg by mouth daily.    Yes [provider]  mirtazapine (REMERON) 7.5 MG tablet TAKE 1  TABLET BY MOUTH AT BEDTIME. Patient taking differently: Take 7.5 mg by mouth at bedtime as needed (sleep). 02/04/20  Yes Alla Feeling, NP  ondansetron (ZOFRAN) 8 MG tablet Take 1 tablet (8 mg total) by mouth every 8 (eight) hours as needed for nausea or vomiting. 12/24/19  Yes Truitt Merle, MD  oxyCODONE (OXY IR/ROXICODONE) 5 MG immediate release tablet Take 1 tablet (5 mg total) by mouth every 6 (six) hours as needed for severe pain. 01/24/20  Yes Truddie Hidden, MD  senna-docusate (SENOKOT-S) 8.6-50 MG tablet Take 1 tablet by mouth 2 (two) times daily. Patient taking differently: Take 1 tablet by mouth 2 (two) times daily as needed for mild constipation. 12/12/19  Yes Shelly Coss, MD  sucralfate (CARAFATE) 1 GM/10ML suspension Take 10 mLs (1 g total) by mouth 4 (four) times daily -  with meals and at bedtime. 12/12/19  Yes Shelly Coss, MD  topiramate (TOPAMAX) 50 MG tablet Take 1 tablet (50 mg total) by mouth at bedtime. Patient taking differently: Take 50 mg by mouth daily as needed (migraines). 11/09/19  Yes Hongalgi, Lenis Dickinson, MD  lipase/protease/amylase 24000-76000 units CPEP Take 1 capsule (24,000 Units total) by mouth 3 (three) times daily with meals. Patient not taking: Reported on 02/17/2020 12/12/19   Shelly Coss, MD  OLANZapine (ZYPREXA) 7.5 MG tablet Take 1 tablet (7.5 mg total) by mouth at bedtime. Patient not taking: No sig reported 02/14/20   Truitt Merle, MD  sterile water SOLN with amino acids 10 % SOLN 1.3 g/kg, dextrose 70 % SOLN 20 % Inject into the vein continuous. Pt on a 12 hour schedule. Last bag 01-31-20. Per husband. [facility].    [provider]  prochlorperazine (COMPAZINE) 10 MG tablet Take 1 tablet (10 mg total) by mouth every 6 (six) hours as needed (Nausea or vomiting). Patient taking differently: Take 10 mg by mouth every 6 (six) hours as needed for nausea or vomiting.  10/18/19 12/18/19  Truitt Merle, MD    Allergies     Cinnamon, Peanut-containing drug products, Prednisone, Corn oil, Corn-containing products, and Other  Review of Systems   Review of Systems Ten systems are reviewed and are negative for acute change except as noted in the HPI  Physical Exam Updated Vital  Signs BP 104/74   Pulse (!) 117   Temp 98.4 F (36.9 C) (Rectal)   Resp 19   SpO2 100%   Physical Exam Constitutional:      General: She is not in acute distress.    Appearance: Normal appearance. She is well-developed. She is ill-appearing. She is not diaphoretic.  HENT:     Head: Normocephalic and atraumatic.  Eyes:     General: Vision grossly intact. Gaze aligned appropriately.     Pupils: Pupils are equal, round, and reactive to light.  Neck:     Trachea: Trachea and phonation normal.  Cardiovascular:     Rate and Rhythm: Tachycardia present.  Pulmonary:     Effort: Pulmonary effort is normal. No respiratory distress.     Breath sounds: Normal breath sounds.  Abdominal:     General: There is no distension.     Palpations: Abdomen is soft.     Tenderness: There is generalized abdominal tenderness. There is guarding. There is no rebound.  Musculoskeletal:        General: Normal range of motion.     Cervical back: Normal range of motion.     Right lower leg: No edema.     Left lower leg: No edema.  Skin:    General: Skin is warm and dry.  Neurological:     Mental Status: She is alert.     GCS: GCS eye subscore is 4. GCS verbal subscore is 5. GCS motor subscore is 6.     Comments: Speech is clear and goal oriented, follows commands Major Cranial nerves without deficit, no facial droop Moves extremities without ataxia, coordination intact  Psychiatric:        Behavior: Behavior normal.     ED Results / Procedures / Treatments   Labs (all labs ordered are listed, but only abnormal results are displayed) Labs Reviewed  LACTIC ACID, PLASMA - Abnormal; Notable for the following components:      Result Value    Lactic Acid, Venous 4.2 (*)    All other components within normal limits  COMPREHENSIVE METABOLIC PANEL - Abnormal; Notable for the following components:   CO2 20 (*)    Glucose, Bld 134 (*)    BUN 21 (*)    Calcium 8.4 (*)    Albumin 2.1 (*)    AST 150 (*)    ALT 78 (*)    Alkaline Phosphatase 241 (*)    Total Bilirubin 4.9 (*)    All other components within normal limits  CBC WITH DIFFERENTIAL/PLATELET - Abnormal; Notable for the following components:   RBC 3.00 (*)    Hemoglobin 9.4 (*)    HCT 30.4 (*)    MCV 101.3 (*)    RDW 24.1 (*)    nRBC 1.6 (*)    Monocytes Absolute 1.3 (*)    Abs Immature Granulocytes 0.08 (*)    All other components within normal limits  PROTIME-INR - Abnormal; Notable for the following components:   Prothrombin Time 16.4 (*)    INR 1.4 (*)    All other components within normal limits  APTT - Abnormal; Notable for the following components:   aPTT 47 (*)    All other components within normal limits  I-STAT BETA HCG BLOOD, ED ([facility], [facility], [facility] ONLY) - Abnormal; Notable for the following components:   I-stat hCG, quantitative 53.9 (*)    All other components within normal limits  CULTURE, BLOOD (ROUTINE X 2)  CULTURE, BLOOD (ROUTINE  X 2)  URINE CULTURE  LACTIC ACID, PLASMA  URINALYSIS, ROUTINE W REFLEX MICROSCOPIC  TROPONIN I (HIGH SENSITIVITY)  TROPONIN I (HIGH SENSITIVITY)    EKG None  Radiology DG Chest Port 1 View  Result Date: 02/17/2020 CLINICAL DATA:  Weakness and dehydration. Recent history of COVID-19. EXAM: PORTABLE CHEST 1 VIEW COMPARISON:  Chest radiograph 02/01/2020. FINDINGS: Port-A-Cath tip projects over the superior vena cava. Monitoring leads overlie the patient. Stable cardiac and mediastinal contours. Low lung volumes with bibasilar opacities favored represent atelectasis. No pleural effusion or pneumothorax. IMPRESSION: Low lung volumes with bibasilar atelectasis. Electronically Signed   By: Lovey Newcomer M.D.   On: 02/17/2020 13:29     Procedures .Critical Care Performed by: Deliah Boston, PA-C Authorized by: Deliah Boston, PA-C   Critical care provider statement:    Critical care time (minutes):  35   Critical care was necessary to treat or prevent imminent or life-threatening deterioration of the following conditions:  Sepsis   Critical care was time spent personally by me on the following activities:  Discussions with consultants, evaluation of patient's response to treatment, examination of patient, ordering and performing treatments and interventions, ordering and review of laboratory studies, ordering and review of radiographic studies, pulse oximetry, re-evaluation of patient's condition, obtaining history from patient or surrogate, review of old charts and development of treatment plan with patient or surrogate   (including critical care time)  Medications Ordered in ED Medications  lactated ringers infusion ( Intravenous New Bag/Given 02/17/20 1401)  lactated ringers infusion (has no administration in time range)  lactated ringers bolus 1,000 mL (has no administration in time range)  vancomycin (VANCOREADY) IVPB 2000 mg/400 mL (2,000 mg Intravenous New Bag/Given 02/17/20 1411)  sodium chloride 0.9 % bolus 500 mL (has no administration in time range)  iohexol (OMNIPAQUE) 300 MG/ML solution 100 mL (has no administration in time range)  lactated ringers bolus 1,000 mL (1,000 mLs Intravenous New Bag/Given 02/17/20 1318)  ceFEPIme (MAXIPIME) 2 g in sodium chloride 0.9 % 100 mL IVPB (0 g Intravenous Stopped 02/17/20 1350)  metroNIDAZOLE (FLAGYL) IVPB 500 mg (500 mg Intravenous New Bag/Given 02/17/20 1402)  fentaNYL (SUBLIMAZE) injection 50 mcg (50 mcg Intravenous Given 02/17/20 1515)    ED Course  I have reviewed the triage vital signs and the nursing notes.  Pertinent labs & imaging results that were available during my care of the patient were reviewed by me and considered in my medical decision making  (see chart for details).  Clinical Course as of 02/18/20 1413  Sun Feb 17, 2020  1517 Lactic Acid, Venous(!!): 4.2 [GG]  1713 I spoke with Dr. Lindi Adie, oncology, who asked that I add him and Dr. Burr Medico to the treatment team and they will round on her once admitted to hospitalist services. [GG]  1716 I spoke with Dr. Darrick Meigs who will see and admit patient. [GG]    Clinical Course User Index [GG] Corena Herter, PA-C   MDM Rules/Calculators/A&P                         Additional history obtained from: 1. Nursing notes from this visit. 2. Review of electronic medical records.  Reviewed patient's discharge summary from recent hospitalization, she was admitted on 02/01/2020, discharged on 02/07/2020.  She had presented that day with abdominal pain nausea vomiting she was febrile tachypneic and tachycardic.  COVID test was positive, patient was without respiratory symptoms.  She was drawn empiric  antibiotics.  Cultures were negative.  Patient was discharged to home with oncology follow-up --------------------------------- 60 year old female history as above presented ill-appearing with abdominal pain nausea vomiting diarrhea body aches that began this morning.  She is tachycardic and tachypneic on initial evaluation, no history of fever.  Abdomen is generally tender and she has voluntary guarding.  Lung sounds clear bilaterally.  Concern for sepsis, order set utilized.  Broad-spectrum antibiotics for unknown source ordered.  CT abdomen pelvis ordered.  IV fluids ordered. Patient seen and evaluated by Dr. Ashok Cordia who agrees with plan of care. - I ordered, reviewed and interpreted labs which include: Lactic acidosis 4.1 Beta-hCG is elevated this was part of the order set, patient has hysterectomy. PT/INR and APTT slightly elevated. CBC shows baseline hemoglobin 9.4, no leukocytosis.  No thrombocytopenia. CMP shows elevation of LFTs, AST ALT and alk phos are similar to prior bilirubins up to 4.9 from 2.5 ten  days ago.  CXR:  IMPRESSION:  Low lung volumes with bibasilar atelectasis.   3:10 PM: Patient reevaluated she is resting in bed no acute distress remains slightly tachycardic but improving.  She has been receiving IV fluids, antibiotics.  She was given broad-spectrum antibiotics for unknown source.  2500 mL IV fluid ordered for sepsis;, patient without history of heart failure, most recent echo 2018 shows 55-60% EF.  Nursing establishing larger line with ultrasound now for CT abdomen pelvis.  Fentanyl ordered for pain control.  Care handoff given to Killian PA-C at shift change.  Plan of care is to follow-up on CT abdomen pelvis, anticipate consults to oncology and hospitalist service for admission.  Cynthia Frye was evaluated in Emergency Department on 02/17/2020 for the symptoms described in the history of present illness. She was evaluated in the context of the global COVID-19 pandemic, which necessitated consideration that the patient might be at risk for infection with the SARS-CoV-2 virus that causes COVID-19. Institutional protocols and algorithms that pertain to the evaluation of patients at risk for COVID-19 are in a state of rapid change based on information released by regulatory bodies including the CDC and federal and state organizations. These policies and algorithms were followed during the patient's care in the ED.   Note: Portions of this report may have been transcribed using voice recognition software. Every effort was made to ensure accuracy; however, inadvertent computerized transcription errors may still be present. Final Clinical Impression(s) / ED Diagnoses Final diagnoses:  None    Rx / DC Orders ED Discharge Orders    None       Gari Crown 02/17/20 1526    Lajean Saver, MD 02/18/20 [place], [place], PA-C 02/18/20 1414    Lajean Saver, MD 02/19/20 1336

## 2020-02-17 NOTE — ED Notes (Signed)
Pt has been assigned new room that is being cleaned. No complaints at the moment. No change in status. Will continue to monitor.

## 2020-02-17 NOTE — ED Notes (Signed)
Attempted to call report, room assignment change in progress. Pt will possibly be placed in another area per charge. Please call report at [phone], Vassie Loll ( charge nurse) made aware will continue to monitor.

## 2020-02-17 NOTE — Progress Notes (Signed)
Pharmacy Antibiotic Note  Cynthia Frye is a 60 y.o. female admitted on 02/17/2020 with sepsis, likely pyelonephritis.  Pharmacy has been consulted for Vancomycin & Cefepime dosing.  Plan: Cefepime 2gm q8 Vancomycin 2gm x1, then 1gm q12    Temp (24hrs), Avg:98.6 F (37 C), Min:98.4 F (36.9 C), Max:98.8 F (37.1 C)  Recent Labs  Lab 02/17/20 1236 02/17/20 1315 02/17/20 1500  WBC 9.3  --   --   CREATININE 0.75  --   --   LATICACIDVEN  --  4.2* 3.1*    Estimated Creatinine Clearance: 83.9 mL/min (by C-G formula based on SCr of 0.75 mg/dL).    Allergies  Allergen Reactions  . Cinnamon Other (See Comments)    Other reaction(s): Other (See Comments) Unknown  On allergy test Unknown  On allergy test  . Peanut-Containing Drug Products Hives  . Prednisone Other (See Comments)    Interacts with another medicine she is taking  . Corn Oil Hives  . Corn-Containing Products Hives  . Other Rash    Red grapefruit and naval oranges- lips tingling and facial rash Potatoes, tomatoes, garlic, oregano/basil caused headache Other reaction(s): migratory headache   Antimicrobials this admission: 1/23 Flagyl x1 in ED 1/23 Cefepime >>  1/23 Vancomycin >>   Dose adjustments this admission:  Microbiology results: 1/23 BCx x1 set: sent  Thank you for allowing pharmacy to be a part of this patient's care.  Minda Ditto PharmD 02/17/2020 6:33 PM

## 2020-02-17 NOTE — ED Triage Notes (Signed)
Pt reports dehydration and weakness that began this morning. Pt has hx of pancreatic CA and had covid a week ago.

## 2020-02-17 NOTE — ED Notes (Signed)
Initial contact with pt. Pt is on monitor x4. Pt has been changed ( linen) d/t  Soiled. Pt is asking about room assignment. No signs of distress. Will continue to monitor.

## 2020-02-18 ENCOUNTER — Telehealth: Payer: Self-pay | Admitting: Hematology

## 2020-02-18 DIAGNOSIS — A419 Sepsis, unspecified organism: Secondary | ICD-10-CM | POA: Diagnosis not present

## 2020-02-18 DIAGNOSIS — R6520 Severe sepsis without septic shock: Secondary | ICD-10-CM

## 2020-02-18 LAB — COMPREHENSIVE METABOLIC PANEL
ALT: 71 U/L — ABNORMAL HIGH (ref 0–44)
AST: 148 U/L — ABNORMAL HIGH (ref 15–41)
Albumin: 1.7 g/dL — ABNORMAL LOW (ref 3.5–5.0)
Alkaline Phosphatase: 214 U/L — ABNORMAL HIGH (ref 38–126)
Anion gap: 8 (ref 5–15)
BUN: 18 mg/dL (ref 6–20)
CO2: 22 mmol/L (ref 22–32)
Calcium: 8.3 mg/dL — ABNORMAL LOW (ref 8.9–10.3)
Chloride: 103 mmol/L (ref 98–111)
Creatinine, Ser: 0.57 mg/dL (ref 0.44–1.00)
GFR, Estimated: >60 mL/min (ref >60)
Glucose, Bld: 71 mg/dL (ref 70–99)
Potassium: 4 mmol/L (ref 3.5–5.1)
Sodium: 133 mmol/L — ABNORMAL LOW (ref 135–145)
Total Bilirubin: 4.7 mg/dL — ABNORMAL HIGH (ref 0.3–1.2)
Total Protein: 6.5 g/dL (ref 6.5–8.1)

## 2020-02-18 LAB — CBC
HCT: 24 % — ABNORMAL LOW (ref 36.0–46.0)
Hemoglobin: 7.6 g/dL — ABNORMAL LOW (ref 12.0–15.0)
MCH: 31.9 pg (ref 26.0–34.0)
MCHC: 31.7 g/dL (ref 30.0–36.0)
MCV: 100.8 fL — ABNORMAL HIGH (ref 80.0–100.0)
Platelets: 133 10*3/uL — ABNORMAL LOW (ref 150–400)
RBC: 2.38 MIL/uL — ABNORMAL LOW (ref 3.87–5.11)
RDW: 23.9 % — ABNORMAL HIGH (ref 11.5–15.5)
WBC: 6.1 10*3/uL (ref 4.0–10.5)
nRBC: 0.7 % — ABNORMAL HIGH (ref 0.0–0.2)

## 2020-02-18 LAB — PROCALCITONIN: Procalcitonin: 4.42 ng/mL

## 2020-02-18 LAB — GLUCOSE, CAPILLARY: Glucose-Capillary: 71 mg/dL (ref 70–99)

## 2020-02-18 LAB — MRSA PCR SCREENING: MRSA by PCR: NEGATIVE

## 2020-02-18 MED ORDER — FENTANYL CITRATE (PF) 100 MCG/2ML IJ SOLN
50.0000 ug | Freq: Once | INTRAMUSCULAR | Status: AC
  Administered 2020-02-18: 50 ug via INTRAVENOUS
  Filled 2020-02-18: qty 2

## 2020-02-18 MED ORDER — ENSURE ENLIVE PO LIQD
237.0000 mL | Freq: Two times a day (BID) | ORAL | Status: DC
  Administered 2020-02-18 – 2020-02-26 (×5): 237 mL via ORAL

## 2020-02-18 MED ORDER — METOCLOPRAMIDE HCL 5 MG/ML IJ SOLN
10.0000 mg | Freq: Once | INTRAMUSCULAR | Status: AC
  Administered 2020-02-18: 10 mg via INTRAVENOUS
  Filled 2020-02-18: qty 2

## 2020-02-18 MED ORDER — SODIUM CHLORIDE 0.9 % IV SOLN
2.0000 g | INTRAVENOUS | Status: DC
  Administered 2020-02-18: 2 g via INTRAVENOUS
  Filled 2020-02-18 (×2): qty 20

## 2020-02-18 MED ORDER — METOPROLOL SUCCINATE ER 50 MG PO TB24
50.0000 mg | ORAL_TABLET | Freq: Every day | ORAL | Status: DC
  Administered 2020-02-18 – 2020-02-27 (×10): 50 mg via ORAL
  Filled 2020-02-18 (×3): qty 1
  Filled 2020-02-18: qty 2
  Filled 2020-02-18 (×6): qty 1

## 2020-02-18 MED ORDER — ONDANSETRON HCL 4 MG/2ML IJ SOLN
4.0000 mg | Freq: Three times a day (TID) | INTRAMUSCULAR | Status: DC | PRN
  Administered 2020-02-24 – 2020-02-25 (×3): 4 mg via INTRAVENOUS
  Filled 2020-02-18 (×3): qty 2

## 2020-02-18 MED ORDER — TOPIRAMATE 25 MG PO TABS: 50.0000 mg | ORAL_TABLET | Freq: Every day | ORAL | Status: DC | PRN

## 2020-02-18 MED ORDER — FENTANYL CITRATE (PF) 100 MCG/2ML IJ SOLN
25.0000 ug | INTRAMUSCULAR | Status: DC | PRN
  Administered 2020-02-18 – 2020-02-20 (×9): 50 ug via INTRAVENOUS
  Filled 2020-02-18 (×10): qty 2

## 2020-02-18 MED ORDER — LORAZEPAM 2 MG/ML IJ SOLN
0.5000 mg | INTRAMUSCULAR | Status: DC | PRN
  Administered 2020-02-19 – 2020-02-29 (×6): 0.5 mg via INTRAVENOUS
  Filled 2020-02-18 (×7): qty 1

## 2020-02-18 MED ORDER — SUCRALFATE 1 GM/10ML PO SUSP
1.0000 g | Freq: Three times a day (TID) | ORAL | Status: DC
  Administered 2020-02-18 – 2020-03-02 (×47): 1 g via ORAL
  Filled 2020-02-18 (×52): qty 10

## 2020-02-18 MED ORDER — PROCHLORPERAZINE EDISYLATE 10 MG/2ML IJ SOLN
5.0000 mg | Freq: Four times a day (QID) | INTRAMUSCULAR | Status: DC | PRN
  Administered 2020-02-18: 5 mg via INTRAVENOUS
  Filled 2020-02-18: qty 2

## 2020-02-18 MED ORDER — POLYETHYLENE GLYCOL 3350 17 G PO PACK
17.0000 g | PACK | Freq: Once | ORAL | Status: AC
  Administered 2020-02-18: 17 g via ORAL
  Filled 2020-02-18: qty 1

## 2020-02-18 MED ORDER — DRONABINOL 2.5 MG PO CAPS
2.5000 mg | ORAL_CAPSULE | Freq: Two times a day (BID) | ORAL | Status: DC
  Administered 2020-02-18 – 2020-03-02 (×25): 2.5 mg via ORAL
  Filled 2020-02-18 (×26): qty 1

## 2020-02-18 MED ORDER — MORPHINE SULFATE (PF) 2 MG/ML IV SOLN
2.0000 mg | Freq: Once | INTRAVENOUS | Status: AC
Start: 2020-02-18 — End: 2020-02-18
  Administered 2020-02-18: 2 mg via INTRAVENOUS
  Filled 2020-02-18: qty 1

## 2020-02-18 MED ORDER — ORAL CARE MOUTH RINSE
15.0000 mL | Freq: Two times a day (BID) | OROMUCOSAL | Status: DC
  Administered 2020-02-18 – 2020-03-02 (×21): 15 mL via OROMUCOSAL

## 2020-02-18 NOTE — TOC Initial Note (Signed)
Transition of Care [facility]) - Initial/Assessment Note    Patient Details  Name: Cynthia Frye MRN: [id number] Date of Birth: 07/31/1960  Transition of Care [facility]) CM/SW Contact:    Leeroy Cha, RN Phone Number: 02/18/2020, 10:16 AM  Clinical Narrative:                 60 y.o.female,with medical history of pancreatic cancer s/p chemotherapy, lupus, asthma, recent COVID-19 infection,which was treated with remdesivir in the hospital for 3 days during an admission for sepsis of unclear origin who has continued to have abdominal pain, nausea, and failure to thrive at home prompting return to the ED 1/23. She was tachycardic, tachypneic with CT abd/pelvis demonstrating known widespread cancer and mild heterogenous contrast uptake in kidneys bilaterally. She was given broad spectrum antibiotics and sepsis-dose IV fluids and admitted to SDU.  Assessment & Plan: Active Problems:   Severe sepsis ([place])  Goals of care discussion: The patient has advanced cancer which has progressed despite treatment (which she has tolerated poorly), now no longer on treatment of any kind. Hospice has been recommended. She agrees with me this morning that we need to aggressively treat her nausea and pain as our primary concern as the underlying problem is not a treatable one. She recognizes the grim prognosis but remains reluctant to discuss much further. We did not yet broach code status, though I'm hoping that with time and encouragement/support from Dr. Burr Medico and our palliative care medicine team, we will be able to relieve this patient's suffering.  - IV zofran prn; IV compazine prn; IV fentanyl prn > will need easily absorbable options at home.  - IV ativan added as well.   Severe sepsis due to pyelonephritis: Diagnosed at admission based on tachycardia, tachypnea, abnormalities of kidneys on CT abdomen. Afebrile, no leukocytosis. - Stop vancomycin, transition cefepime to ceftriaxone. Strongly consider  discontinuation of antibiotics since she has no urinary symptoms and abdominal/back pain is likely due to malignancy and vital sign abnormalities are likely due to dehydration. Check PCT, monitor blood cultures.   Recent covid-19 infection: +SARS-CoV-2 testing 1/7. s/p Pfizer July, Aug 2021. Treated with some remdesivir, never hypoxic to require steroids. Has completed 10 days of isolation.  - DC isolation.  Acute left CFV DVT, chronic right-sided pulmonary emboli: Left iliac vein DVT (10/2019), DVT in the left peroneal and gastrocnemius veins (01/07/2020), DVT in the left common femoral vein (02/01/2020) - Lovenox 1mg /kg q12h  Hyponatremia:  - Continue LR, decrease rate.   Nausea, vomiting, intolerance to enteral feeding, GERD, severe protein calorie malnutrition: Prealbumin undetectable at recent hospitalization. - Maximize antiemetics, hold TPN for now.   Metastatic pancreatic cancer, elevated LFTs: Progressive despite chemotherapy which is also poorly tolerated. No longer on chemotherapy with plans to shift to hospice plan of care soon.  - Dr. Burr Medico added to treatment team.  - Palliative care consulted PLAN IS TO [place].  Expected Discharge Plan: Home/Self Care Barriers to Discharge: Continued Medical Work up   Patient Goals and CMS Choice Patient states their goals for this hospitalization and ongoing recovery are:: TO [place] CMS Medicare.gov Compare Post Acute Care list provided to:: Patient    Expected Discharge Plan and Services Expected Discharge Plan: Home/Self Care   Discharge Planning Services: CM Consult   Living arrangements for the past 2 months: [place]  Prior Living Arrangements/Services Living arrangements for the past 2 months: Single Family Home Lives with:: Spouse Patient language and need for interpreter reviewed:: Yes Do  you feel safe going back to the place where you live?: Yes      Need for Family Participation in Patient Care: Yes (Comment) Care giver support system in place?: Yes (comment)   Criminal Activity/Legal Involvement Pertinent to Current Situation/Hospitalization: No - Comment as needed  Activities of Daily Living Home Assistive Devices/Equipment: Walker (specify type),Shower chair with back,Other (Comment) (TPN supplies) ADL Screening (condition at time of admission) Patient's cognitive ability adequate to safely complete daily activities?: Yes Is the patient deaf or have difficulty hearing?: No Does the patient have difficulty seeing, even when wearing glasses/contacts?: No Does the patient have difficulty concentrating, remembering, or making decisions?: No Patient able to express need for assistance with ADLs?: Yes Does the patient have difficulty dressing or bathing?: Yes Independently performs ADLs?: No Communication: Independent Dressing (OT): Needs assistance Is this a change from baseline?: Pre-admission baseline Grooming: Needs assistance Is this a change from baseline?: Pre-admission baseline Feeding: Independent Bathing: Needs assistance Is this a change from baseline?: Pre-admission baseline Toileting: Needs assistance Is this a change from baseline?: Pre-admission baseline In/Out Bed: Needs assistance Is this a change from baseline?: Pre-admission baseline Walks in Home: Needs assistance Is this a change from baseline?: Pre-admission baseline Does the patient have difficulty walking or climbing stairs?: Yes Weakness of Legs: Both Weakness of Arms/Hands: None  Permission Sought/Granted                  Emotional Assessment Appearance:: Appears stated age Attitude/Demeanor/Rapport: Engaged Affect (typically observed): Calm Orientation: : Oriented to Place,Oriented to  Time,Oriented to Situation,Oriented to Self Alcohol / Substance Use: Not Applicable Psych  Involvement: No (comment)  Admission diagnosis:  Dehydration [E86.0] Weakness [R53.1] Pyelonephritis [N12] Elevated lactic acid level [R79.89] Severe sepsis (HCC) [A41.9, R65.20] Patient Active Problem List   Diagnosis Date Noted  . Severe sepsis (HCC) 02/17/2020  . COVID-19 virus infection 02/01/2020  . Sepsis (HCC) 02/01/2020  . Biliary obstruction 01/15/2020  . Abdominal pain 01/04/2020  . Dysphagia 12/10/2019  . Malnutrition of moderate degree 11/26/2019  . 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 Constipation 11/25/2019  . Generalized abdominal pain   . Nausea and vomiting in adult 11/24/2019  . Dehydration 11/24/2019  . Prolonged QT interval 11/24/2019  . Emesis   . Tachycardia   . Genetic testing 11/12/2019  . Colitis 11/04/2019  . Acute deep vein thrombosis of left iliac vein (HCC) 11/04/2019  . Hypokalemia 11/04/2019  . Port-A-Cath in place 10/24/2019  . Goals of care, counseling/discussion 10/18/2019  . Pancreatic cancer (HCC) 10/02/2019  . Chronic migraine without aura without status migrainosus, not intractable 04/23/2019  . Migraine with aura and without status migrainosus, not intractable 04/23/2019  . Essential hypertension 04/29/2016  . Lupus (systemic lupus erythematosus) (HCC) 04/29/2016  . Asthma 04/29/2016  . Seasonal allergies 04/29/2016  . Abnormal stress test 04/09/2016   PCP:  Marden NobleGates, Robert, MD Pharmacy:   [facility] [phone][id number] - [address], [place] - [address] [place] [id number] Phone: [phone] Fax: [phone]     Social Determinants of Health (SDOH) Interventions    Readmission Risk Interventions Readmission Risk Prevention Plan 12/11/2019 11/27/2019  Transportation Screening - Complete  HRI or Home Care Consult - Complete  Social Work Consult for Recovery Care Planning/Counseling - Complete  Palliative Care Screening - Not Applicable  Medication  Review Press photographer) - Complete  PCP or  Specialist appointment within 3-5 days of discharge Complete -  [place] or Home Care Consult Complete -  SW Recovery Care/Counseling Consult Complete -  Palliative Care Screening Not Applicable -  [place] Not Applicable -  Some recent data might be hidden

## 2020-02-18 NOTE — Progress Notes (Signed)
PROGRESS NOTE  Cynthia Frye  [id number] DOB: Feb 11, 1960 DOA: 02/17/2020 PCP: Josetta Huddle, MD  Outpatient Specialists: Oncology, Dr. Burr Medico Brief Narrative: Cynthia Frye is a 60 y.o. female, with medical history of pancreatic cancer s/p chemotherapy, lupus, asthma, recent COVID-19 infection, which was treated with remdesivir in the hospital for 3 days during an admission for sepsis of unclear origin who has continued to have abdominal pain, nausea, and failure to thrive at home prompting return to the ED 1/23. She was tachycardic, tachypneic with CT abd/pelvis demonstrating known widespread cancer and mild heterogenous contrast uptake in kidneys bilaterally. She was given broad spectrum antibiotics and sepsis-dose IV fluids and admitted to SDU.  Assessment & Plan: Active Problems:   Severe sepsis ([place])  Goals of care discussion: The patient has advanced cancer which has progressed despite treatment (which she has tolerated poorly), now no longer on treatment of any kind. Hospice has been recommended. She agrees with me this morning that we need to aggressively treat her nausea and pain as our primary concern as the underlying problem is not a treatable one. She recognizes the grim prognosis but remains reluctant to discuss much further. We did not yet broach code status, though I'm hoping that with time and encouragement/support from Dr. Burr Medico and our palliative care medicine team, we will be able to relieve this patient's suffering.  - IV zofran prn; IV compazine prn; IV fentanyl prn > will need easily absorbable options at home.  - IV ativan added as well.   Severe sepsis due to pyelonephritis: Diagnosed at admission based on tachycardia, tachypnea, abnormalities of kidneys on CT abdomen. Afebrile, no leukocytosis. - Stop vancomycin, transition cefepime to ceftriaxone. Strongly consider discontinuation of antibiotics since she has no urinary symptoms and abdominal/back pain is likely due to  malignancy and vital sign abnormalities are likely due to dehydration. Check PCT, monitor blood cultures.   Recent covid-19 infection: +SARS-CoV-2 testing 1/7. s/p Pfizer July, Aug 2021. Treated with some remdesivir, never hypoxic to require steroids. Has completed 10 days of isolation.  - DC isolation.  Acute left CFV DVT, chronic right-sided pulmonary emboli: Left iliac vein DVT (10/2019), DVT in the left peroneal and gastrocnemius veins (01/07/2020), DVT in the left common femoral vein (02/01/2020) - Lovenox 1mg /kg q12h  Hyponatremia:  - Continue LR, decrease rate.   Nausea, vomiting, intolerance to enteral feeding, GERD, severe protein calorie malnutrition: Prealbumin undetectable at recent hospitalization. - Maximize antiemetics, hold TPN for now.   Metastatic pancreatic cancer, elevated LFTs: Progressive despite chemotherapy which is also poorly tolerated. No longer on chemotherapy with plans to shift to hospice plan of care soon.  - Dr. Burr Medico added to treatment team.  - Palliative care consulted  Pancytopenia, anemia of malignancy:  - Hgb 7.6 is below oncology's previously stated threshold for transfusion.   Thrombocytopenia: No evidence of bleeding currently.   SLE: No flare. Hold home meds for now  HTN:  - Restart metoprolol. Hold other home meds with softer BPs on admit. Restart when rising BP.  DVT prophylaxis: Lovenox Code Status: Full Family Communication: None at bedside Disposition Plan:  Status is: Inpatient  Remains inpatient appropriate because:Ongoing active pain requiring inpatient pain management and IV treatments appropriate due to intensity of illness or inability to take PO   Dispo: The patient is from: Home              Anticipated d/c is to: Home with hospice  Anticipated d/c date is: 1 day. The patient fell a couple times during her last hospitalization and remains at very high risk of hospital-related morbidity. We will abbreviate inpatient  admission as much as possible.               Patient currently is not medically stable to d/c.   Difficult to place patient No  Consultants:   Oncology  Palliative care  Procedures:   None  Antimicrobials:  Vancomycin, cefepime > ceftriaxone   Subjective: Pain in abdomen is diffuse, constant and also present in the back bilaterally, worse than but similar in character to her chronic pain. She has no hematuria, dysuria, urinary frequency or urgency or fever or chills. Fentanyl helped pain. She couldn't keep oxycodone down at home which was one of the main reasons to come to the hospital.  Objective: Vitals:   02/18/20 0400 02/18/20 0500 02/18/20 0600 02/18/20 0800  BP: 111/74 124/84 124/71 131/81  Pulse: 83 90 89 84  Resp: 13 18 15 17   Temp:    (!) 97.5 F (36.4 C)  TempSrc:    Oral  SpO2: 100% 100% 100% 100%  Weight:      Height:        Intake/Output Summary (Last 24 hours) at 02/18/2020 0905 Last data filed at 02/18/2020 0600 Gross per 24 hour  Intake 3045.59 ml  Output 400 ml  Net 2645.59 ml   Filed Weights   02/17/20 2345  Weight: 79.4 kg    Gen: 60 y.o. female in no acute distress Pulm: Non-labored breathing room air with mask on. Clear to auscultation bilaterally.  CV: Regular rate and rhythm. No murmur, rub, or gallop. No JVD, no pitting pedal edema. GI: Abdomen soft, pt declined any real palpation of the abdomen, +BS. Ext: Warm, no deformities Skin: No rashes, lesions nor ulcers Neuro: Alert and oriented. No focal neurological deficits. Psych: Judgement and insight appear normal. Mood & affect appropriate.   Data Reviewed: I have personally reviewed following labs and imaging studies  CBC: Recent Labs  Lab 02/17/20 1236 02/18/20 0300  WBC 9.3 6.1  NEUTROABS 5.9  --   HGB 9.4* 7.6*  HCT 30.4* 24.0*  MCV 101.3* 100.8*  PLT 189 [id number]*   Basic Metabolic Panel: Recent Labs  Lab 02/17/20 1236 02/18/20 0300  NA 135 133*  K 4.3 4.0  CL 101 103   CO2 20* 22  GLUCOSE 134* 71  BUN 21* 18  CREATININE 0.75 0.57  CALCIUM 8.4* 8.3*   GFR: Estimated Creatinine Clearance: 83.1 mL/min (by C-G formula based on SCr of 0.57 mg/dL). Liver Function Tests: Recent Labs  Lab 02/17/20 1236 02/18/20 0300  AST 150* 148*  ALT 78* 71*  ALKPHOS 241* 214*  BILITOT 4.9* 4.7*  PROT 7.8 6.5  ALBUMIN 2.1* 1.7*   No results for input(s): LIPASE, AMYLASE in the last 168 hours. No results for input(s): AMMONIA in the last 168 hours. Coagulation Profile: Recent Labs  Lab 02/17/20 1236  INR 1.4*   Cardiac Enzymes: No results for input(s): CKTOTAL, CKMB, CKMBINDEX, TROPONINI in the last 168 hours. BNP (last 3 results) No results for input(s): PROBNP in the last 8760 hours. HbA1C: No results for input(s): HGBA1C in the last 72 hours. CBG: Recent Labs  Lab 02/18/20 0540  GLUCAP 71   Lipid Profile: No results for input(s): CHOL, HDL, LDLCALC, TRIG, CHOLHDL, LDLDIRECT in the last 72 hours. Thyroid Function Tests: No results for input(s): TSH, T4TOTAL, FREET4, T3FREE, THYROIDAB in  the last 72 hours. Anemia Panel: No results for input(s): VITAMINB12, FOLATE, FERRITIN, TIBC, IRON, RETICCTPCT in the last 72 hours. Urine analysis:    Component Value Date/Time   COLORURINE AMBER (A) 02/17/2020 2020   APPEARANCEUR CLEAR 02/17/2020 2020   LABSPEC 1.033 (H) 02/17/2020 2020   PHURINE 8.0 02/17/2020 2020   GLUCOSEU NEGATIVE 02/17/2020 2020   HGBUR NEGATIVE 02/17/2020 2020   BILIRUBINUR NEGATIVE 02/17/2020 2020   KETONESUR NEGATIVE 02/17/2020 2020   PROTEINUR NEGATIVE 02/17/2020 2020   NITRITE NEGATIVE 02/17/2020 2020   LEUKOCYTESUR NEGATIVE 02/17/2020 2020   Recent Results (from the past 240 hour(s))  Blood Culture (routine x 2)     Status: None (Preliminary result)   Collection Time: 02/17/20  1:00 PM   Specimen: BLOOD  Result Value Ref Range Status   Specimen Description   Final    BLOOD SITE NOT SPECIFIED Performed at [facility], [place] [address], [place], [place] [address]    Special Requests   Final    BOTTLES DRAWN AEROBIC AND ANAEROBIC Blood Culture adequate volume Performed at [facility] [address] [place], [place] [address]    Culture PENDING  Incomplete   Report Status PENDING  Incomplete  MRSA PCR Screening     Status: None   Collection Time: 02/17/20 11:18 PM   Specimen: Nasal Mucosa; Nasopharyngeal  Result Value Ref Range Status   MRSA by PCR NEGATIVE NEGATIVE Final    Comment:        The GeneXpert MRSA Assay (FDA approved for NASAL specimens only), is one component of a comprehensive MRSA colonization surveillance program. It is not intended to diagnose MRSA infection nor to guide or monitor treatment for MRSA infections. Performed at [facility], [place] [address] [place], [place] [address]       Radiology Studies: CT ABDOMEN PELVIS W CONTRAST  Result Date: 02/17/2020 CLINICAL DATA:  Abdominal pain, metastatic pancreatic cancer, COVID positive EXAM: CT ABDOMEN AND PELVIS WITH CONTRAST TECHNIQUE: Multidetector CT imaging of the abdomen and pelvis was performed using the standard protocol following bolus administration of intravenous contrast. CONTRAST:  152mL OMNIPAQUE IOHEXOL 300 MG/ML  SOLN COMPARISON:  02/06/2020 FINDINGS: Lower chest: Scarring/atelectasis in the lingula and left lower lobe. Hepatobiliary: Innumerable hepatic metastases, similar to recent CT. Gallbladder is unremarkable, noting mild gallbladder wall edema, similar. No intrahepatic or extrahepatic ductal dilatation. Pancreas: 3.9 x 2.9 cm mass in the pancreatic head (series 2/image 46), similar, corresponding to the patient's known pancreatic adenocarcinoma. Atrophy of the pancreatic body/tail with ductal dilatation. Spleen: Within normal limits. Adrenals/Urinary Tract: Adrenal glands are within normal limits. Segmental/wedge-shaped hypoperfusion involving the posterolateral right  lower kidney (series 2/images 51-52). Associated differential enhancement involving the posterior left lower kidney (series 7/image 43). These findings are nonspecific but are most suggestive of pyelonephritis. No hydronephrosis. Bladder is within normal limits. Stomach/Bowel: Stomach is within normal limits. No evidence of bowel obstruction. Appendix is not discretely visualized. No colonic wall thickening or inflammatory changes. Vascular/Lymphatic: No evidence of abdominal aortic aneurysm. Stable DVT in the left common femoral vein (series 2/image 93). 14 mm short axis portacaval node (series 2/image 41), unchanged. No suspicious pelvic lymphadenopathy. Reproductive: Status post hysterectomy. No adnexal masses. Other: Small to moderate abdominopelvic ascites. Associated mild pelvic peritoneal thickening. Musculoskeletal: Mild degenerative changes at L5-S1. IMPRESSION: Mildly heterogeneous perfusion involving the bilateral kidneys, nonspecific but most suggestive of pyelonephritis. Otherwise, no interval change from recent CT. Pancreatic adenocarcinoma. Innumerable hepatic metastases. Small  upper abdominal nodal metastasis. Small to moderate abdominopelvic ascites with mild peritoneal thickening, likely malignant. Stable DVT in the left common femoral vein. Electronically Signed   By: Julian Hy M.D.   On: 02/17/2020 16:18   DG Chest Port 1 View  Result Date: 02/17/2020 CLINICAL DATA:  Weakness and dehydration. Recent history of COVID-19. EXAM: PORTABLE CHEST 1 VIEW COMPARISON:  Chest radiograph 02/01/2020. FINDINGS: Port-A-Cath tip projects over the superior vena cava. Monitoring leads overlie the patient. Stable cardiac and mediastinal contours. Low lung volumes with bibasilar opacities favored represent atelectasis. No pleural effusion or pneumothorax. IMPRESSION: Low lung volumes with bibasilar atelectasis. Electronically Signed   By: Lovey Newcomer M.D.   On: 02/17/2020 13:29    Scheduled Meds: .  Chlorhexidine Gluconate Cloth  6 each Topical Daily  . dronabinol  2.5 mg Oral BID AC  . enoxaparin  80 mg Subcutaneous Q12H  . feeding supplement  237 mL Oral BID BM  . mouth rinse  15 mL Mouth Rinse BID  . sucralfate  1 g Oral TID WC & HS   Continuous Infusions: . cefTRIAXone (ROCEPHIN)  IV    . lactated ringers 125 mL/hr at 02/18/20 0600     LOS: 1 day   Time spent: 35 minutes.  Patrecia Pour, MD [facility] [email or web address] 02/18/2020, 9:05 AM

## 2020-02-18 NOTE — Telephone Encounter (Signed)
No 1/20 los. No changes made to pt's schedule.  

## 2020-02-18 NOTE — Progress Notes (Signed)
Pt arrived at 1615 in stable condition. Pt place to bed. Telemetry place and verified by CN and NT. Pt resting in bed without not acute distress. SRP, RN

## 2020-02-18 NOTE — Progress Notes (Addendum)
HEMATOLOGY-ONCOLOGY PROGRESS NOTE  SUBJECTIVE: Cynthia Frye is well-known to our service. We follow her for metastatic pancreatic cancer. She was initially treated with FOLFIRINOX which was stopped after cycle #2 secondary to poor tolerance. She was then started on second line gemcitabine and had poor tolerance to gemcitabine as well. She also had significant disease progression despite treatment. The patient presented to the emergency room with nausea and vomiting. CT abdomen/pelvis suggestive of pyelonephritis. Blood cultures and urine culture pending. She remains on ceftriaxone.  The patient reports ongoing nausea and vomiting today. Denies diarrhea and states that she has not moved her bowels in almost 3 days. Reports intermittent sharp abdominal cramping. She is not currently having any chest pain or shortness of breath. Afebrile.  Oncology History Overview Note  Cancer Staging Pancreatic cancer [facility]) Staging form: Exocrine Pancreas, AJCC 8th Edition - Clinical stage from 10/18/2019: Stage IV (cT3, cN1, cM1) - Signed by Truitt Merle, MD on 10/18/2019    Pancreatic cancer ([place])  09/19/2019 Imaging   CT AP w contrast 09/19/19  IMPRESSION: 1. Findings are highly concerning for probable primary pancreatic adenocarcinoma in the anterior aspect of the pancreatic head. Several prominent borderline enlarged lymph nodes are noted in the hepatoduodenal nodal station, and there are multiple indeterminate liver lesions which are highly concerning for probable hepatic metastases. Further evaluation with nonemergent abdominal MRI with and without IV gadolinium with MRCP is recommended in the near future to better evaluate these findings.   09/25/2019 Procedure   Upper EUS by Dr Paulita Fujita  IMPRESSION -There was no evidence of significant pathology in the left lobe of the liver.  -A few lymph nodes were visualized and measures in the peripancreatic region and porta hepata region.  -Hyperchoic material  consistent with sludge was visualized endosonographically in the gallbladder.  -There was no sign of significant pathology in the common bile duct.  -A mass was identified in the pancreatic head. If biopsy results show adenocarcinoma, it would be staged T3N1Mx by endosonographic criteria. Fine needle aspiration performed.    09/25/2019 Initial Biopsy   FINAL MICROSCOPIC DIAGNOSIS: Fine needle aspirate, Pancreas;  MALIGNANT CELLS PRESENT CONSISTENT WITH ADENOCARCINOMA.    10/02/2019 Initial Diagnosis   Pancreatic cancer ([place])   10/11/2019 Procedure   PAC placement y Dr Barry Dienes    10/15/2019 Imaging   CT Chest  IMPRESSION: 1. Small anterior left pneumothorax with dependent atelectasis in the left lower lobe. 2. Increased number of bilateral axillary and subpectoral lymph nodes with mild lymphadenopathy in the left axilla. While this would be an atypical presentation for metastatic pancreatic cancer, this possibility is not excluded 3. Main duct dilatation in the pancreas, better assessed on abdomen CT 09/19/2019.   10/16/2019 Imaging   MRI Abdomen  IMPRESSION: 1. Substantially motion degraded scan. 2. Probable persistent small anterior left lung base pneumothorax, better seen on chest CT from 1 day prior. 3. Poorly marginated hypoenhancing 3.7 x 2.9 cm pancreatic head mass, which appears to invade the anterior peripancreatic fat, compatible with known pancreatic adenocarcinoma. Diffuse irregular dilatation of the main pancreatic duct in the pancreatic body and tail. Mild narrowing of the main portal vein by the mass. Abdominal vasculature remains patent and otherwise uninvolved. 4. Numerous (greater than 10) small liver masses scattered throughout the liver, largest 1.0 cm, which appear to demonstrate targetoid enhancement on the limited motion degraded postcontrast sequences, compatible with liver metastases. 5. Mild porta hepatis adenopathy, suspicious for metastatic disease.    10/18/2019 Cancer Staging   Staging form:  Exocrine Pancreas, AJCC 8th Edition - Clinical stage from 10/18/2019: Stage IV (cT3, cN1, cM1) - Signed by Truitt Merle, MD on 10/18/2019   10/24/2019 - 11/21/2019 Chemotherapy   First-line FOLFIRINOX q2weeks starting 10/24/19. C2 postponed due to N/V/D and dose reduced 20-30%. Given poor tolerance, stopped after 2 cycles.    10/30/2019 Pathology Results   FINAL MICROSCOPIC DIAGNOSIS:   A. LIVER, LESION, BIOPSY:  -  Metastatic carcinoma  -  See comment   COMMENT:   By immunohistochemistry, the neoplastic cells are positive for  cytokeratin 7 and GATA3 with patchy nonspecific staining for PAX 8 but  negative for TTF-1, CDX2 and cytokeratin 20.  Overall, the findings are  consistent with metastasis of the patient's known breast carcinoma.  Prognostic panel (ER, PR, Her-2) is pending and will be reported in an  addendum.    ADDENDUM:   Dr. Laurence Ferrari notified us (November 01, 2019) that the patient was also  being worked up for a pancreatic mass.  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  In addition, ER and PR  are negative.  Gata-3 can be expressed in the pancreatic  adenocarcinomas; therefore, pancreatobiliary primary remains in the  differential.    11/02/2019 Genetic Testing   Negative genetic testing: no pathogenic variants detected in [facility] Common Hereditary Cancers Panel.  The report date is November 02, 2019.   The Common Hereditary Cancers Panel offered by [facility] includes sequencing and/or deletion duplication testing of the following 48 genes: APC, ATM, AXIN2, BARD1, BMPR1A, BRCA1, BRCA2, BRIP1, CDH1, CDK4, CDKN2A (p14ARF), CDKN2A (p16INK4a), CHEK2, CTNNA1, DICER1, EPCAM (Deletion/duplication testing only), GREM1 (promoter region deletion/duplication testing only), KIT, MEN1, MLH1, MSH2, MSH3, MSH6, MUTYH, NBN, NF1, NHTL1, PALB2, PDGFRA, PMS2, POLD1, POLE, PTEN, RAD50, RAD51C, RAD51D, RNF43, SDHB,  SDHC, SDHD, SMAD4, SMARCA4. STK11, TP53, TSC1, TSC2, and VHL.  The following genes were evaluated for sequence changes only: SDHA and HOXB13 c.251G>A variant only.   11/04/2019 Imaging   CT AP  IMPRESSION: 1. Circumferential bowel wall thickening with adjacent fat stranding throughout the colon, most predominant in the transverse colon. This is consistent with colitis, which may be infectious or inflammatory in etiology. 2. Ill-defined pancreatic head mass consistent with known malignancy, similar to mildly increased in comparison to prior CT. There are innumerable hypodense masses throughout the liver, increased in conspicuity in comparison to prior CT. Findings are worrisome for worsening metastatic disease. 3. Filling defect in the LEFT internal iliac vein, likely a thrombus with differential considerations including mixing artifact.   11/24/2019 Imaging   CT AP  IMPRESSION: Pancreatic head mass again noted compatible with patient's given history of pancreatic cancer.   Numerous metastases throughout the liver, enlarging since prior study.     12/10/2019 Procedure   Upper endoscopy by Dr Michail Sermon  IMPRESSION - Z-line regular, 42 cm from the incisors. - Esophageal ulcer with stigmata of recent bleeding. - Gastritis. Biopsied. - Mucosal changes in the duodenum. Biopsied. - Normal second portion of the duodenum. - Gastric and duodenal thickening concerning for inflammation from mets or due to mets from known pancreatic cancer.   FINAL MICROSCOPIC DIAGNOSIS:   A. DUODENUM, BIOPSY:  - Benign duodenal mucosa.  - No dysplasia or malignancy.   B. STOMACH, BIOPSY:  - Antral mucosa with mild chronic active inflammation.  - No intestinal metaplasia, dysplasia or carcinoma.   12/28/2019 -  Chemotherapy   Change her to second-line Gemcitabine 2 weeks on/1 week off starting 12/28/19.  01/16/2020 Imaging   MRI abdomen  IMPRESSION: 1. Prominently dilated dorsal pancreatic  duct extending to the level of the mass in the junction of the pancreatic head and body. The mass abuts the gastric wall, portal vein, and common bile duct, leading to mild narrowing of the common bile duct down to about 0.3 cm, with the common hepatic duct mildly prominent 0.9 cm. The mass has mildly enlarged compared to the CT examination of 11/24/2019. 2. Extensive metastatic burden throughout all lobes of the liver, substantially increased from 11/24/2019 and fairly similar to 01/11/2020. 3. Trace left pleural effusion with adjacent atelectasis in the left lower lobe. Borderline cardiomegaly. 4. Sludge in the gallbladder with gallbladder wall thickening.   01/24/2020 Imaging   CT AP  IMPRESSION: 1. No acute abnormality. 2. No significant change in the known pancreatic malignancy with associated marked dilatation of the pancreatic duct. 3. Stable extensive liver metastatic disease.      REVIEW OF SYSTEMS:   Constitutional: Denies fevers and chills Eyes: Denies blurriness of vision Ears, nose, mouth, throat, and face: Denies mucositis or sore throat Respiratory: Denies cough, dyspnea or wheezes Cardiovascular: Denies palpitation, chest discomfort Gastrointestinal: Reports sharp, cramping abdominal pain with nausea and vomiting. Denies diarrhea but reports constipation. Skin: Denies abnormal skin rashes Lymphatics: Denies new lymphadenopathy or easy bruising Neurological:Denies numbness, tingling or new weaknesses Behavioral/Psych: Mood is stable, no new changes  Extremities: No lower extremity edema All other systems were reviewed with the patient and are negative.  I have reviewed the past medical history, past surgical history, social history and family history with the patient and they are unchanged from previous note.   PHYSICAL EXAMINATION: ECOG PERFORMANCE STATUS: 3 - Symptomatic, >50% confined to bed  Vitals:   02/18/20 0800 02/18/20 1000  BP: 131/81 (!) 136/96   Pulse: 84 88  Resp: 17 14  Temp: (!) 97.5 F (36.4 C)   SpO2: 100% 100%   Filed Weights   02/17/20 2345  Weight: 79.4 kg    Intake/Output from previous day: 01/23 0701 - 01/24 0700 In: 3045.6 [I.V.:546.8; IV Piggyback:2498.8] Out: 400 [Urine:400]  GENERAL:alert, no distress and comfortable SKIN: skin color, texture, turgor are normal, no rashes or significant lesions EYES: normal, Conjunctiva are pink and non-injected, sclera clear OROPHARYNX:no exudate, no erythema and lips, buccal mucosa, and tongue normal  LUNGS: clear to auscultation and percussion with normal breathing effort HEART: regular rate & rhythm and no murmurs and no lower extremity edema ABDOMEN: Positive bowel sounds, soft, generalized tenderness Musculoskeletal:no cyanosis of digits and no clubbing  NEURO: alert & oriented x 3 with fluent speech, no focal motor/sensory deficits  LABORATORY DATA:  I have reviewed the data as listed CMP Latest Ref Rng & Units 02/18/2020 02/17/2020 02/07/2020  Glucose 70 - 99 mg/dL 71 134(H) 135(H)  BUN 6 - 20 mg/dL 18 21(H) 9  Creatinine 0.44 - 1.00 mg/dL 0.57 0.75 0.53  Sodium 135 - 145 mmol/L 133(L) 135 132(L)  Potassium 3.5 - 5.1 mmol/L 4.0 4.3 3.3(L)  Chloride 98 - 111 mmol/L 103 101 99  CO2 22 - 32 mmol/L 22 20(L) 23  Calcium 8.9 - 10.3 mg/dL 8.3(L) 8.4(L) 8.3(L)  Total Protein 6.5 - 8.1 g/dL 6.5 7.8 6.6  Total Bilirubin 0.3 - 1.2 mg/dL 4.7(H) 4.9(H) 2.5(H)  Alkaline Phos 38 - 126 U/L 214(H) 241(H) 299(H)  AST 15 - 41 U/L 148(H) 150(H) 183(H)  ALT 0 - 44 U/L 71(H) 78(H) 155(H)    Lab Results  Component  Value Date   WBC 6.1 02/18/2020   HGB 7.6 (L) 02/18/2020   HCT 24.0 (L) 02/18/2020   MCV 100.8 (H) 02/18/2020   PLT 133 (L) 02/18/2020   NEUTROABS 5.9 02/17/2020    CT ANGIO CHEST PE W OR WO CONTRAST  Result Date: 02/01/2020 CLINICAL DATA:  Increasing right rib pain for 1 day. Nausea and vomiting EXAM: CT ANGIOGRAPHY CHEST WITH CONTRAST TECHNIQUE: Multidetector  CT imaging of the chest was performed using the standard protocol during bolus administration of intravenous contrast. Multiplanar CT image reconstructions and MIPs were obtained to evaluate the vascular anatomy. CONTRAST:  139m OMNIPAQUE IOHEXOL 350 MG/ML SOLN COMPARISON:  01/11/2020 FINDINGS: Cardiovascular: Normal heart size. No pericardial effusion. No acute aortic finding. Extensive motion and streak artifact. Pulmonary emboli are again seen at the bifurcation of the right main pulmonary artery, peripheral within the vessel and nonacute. More peripheral pulmonary arteries are not reliably characterized due to the degree of artifact. No definite progression from 01/11/2020. Mediastinum/Nodes: Mild enlargement of bilateral axillary lymph nodes which may be related to history of connective tissue disease. No mediastinal adenopathy. Lungs/Pleura: Reportedly patient is COVID positive per the chart. There is no edema, consolidation, effusion, or pneumothorax. Small opacity at the lingula which has a scar-like appearance, also seen on prior. No suspicious pulmonary nodules. Upper Abdomen: Reported separately Musculoskeletal: No acute finding. Review of the MIP images confirms the above findings. IMPRESSION: 1. Very limited CTA due to the extent of motion and streak artifact. Persisting nonacute right-sided pulmonary emboli as seen on 01/11/2020 chest CTA. No clear progression of emboli. DVT is present at the left common femoral vein on prior abdominal CT. 2. COVID positivity without pneumonia. Electronically Signed   By: JMonte FantasiaM.D.   On: 02/01/2020 04:53   CT ABDOMEN PELVIS W CONTRAST  Result Date: 02/17/2020 CLINICAL DATA:  Abdominal pain, metastatic pancreatic cancer, COVID positive EXAM: CT ABDOMEN AND PELVIS WITH CONTRAST TECHNIQUE: Multidetector CT imaging of the abdomen and pelvis was performed using the standard protocol following bolus administration of intravenous contrast. CONTRAST:  1031m OMNIPAQUE IOHEXOL 300 MG/ML  SOLN COMPARISON:  02/06/2020 FINDINGS: Lower chest: Scarring/atelectasis in the lingula and left lower lobe. Hepatobiliary: Innumerable hepatic metastases, similar to recent CT. Gallbladder is unremarkable, noting mild gallbladder wall edema, similar. No intrahepatic or extrahepatic ductal dilatation. Pancreas: 3.9 x 2.9 cm mass in the pancreatic head (series 2/image 46), similar, corresponding to the patient's known pancreatic adenocarcinoma. Atrophy of the pancreatic body/tail with ductal dilatation. Spleen: Within normal limits. Adrenals/Urinary Tract: Adrenal glands are within normal limits. Segmental/wedge-shaped hypoperfusion involving the posterolateral right lower kidney (series 2/images 51-52). Associated differential enhancement involving the posterior left lower kidney (series 7/image 43). These findings are nonspecific but are most suggestive of pyelonephritis. No hydronephrosis. Bladder is within normal limits. Stomach/Bowel: Stomach is within normal limits. No evidence of bowel obstruction. Appendix is not discretely visualized. No colonic wall thickening or inflammatory changes. Vascular/Lymphatic: No evidence of abdominal aortic aneurysm. Stable DVT in the left common femoral vein (series 2/image 93). 14 mm short axis portacaval node (series 2/image 41), unchanged. No suspicious pelvic lymphadenopathy. Reproductive: Status post hysterectomy. No adnexal masses. Other: Small to moderate abdominopelvic ascites. Associated mild pelvic peritoneal thickening. Musculoskeletal: Mild degenerative changes at L5-S1. IMPRESSION: Mildly heterogeneous perfusion involving the bilateral kidneys, nonspecific but most suggestive of pyelonephritis. Otherwise, no interval change from recent CT. Pancreatic adenocarcinoma. Innumerable hepatic metastases. Small upper abdominal nodal metastasis. Small to moderate abdominopelvic ascites with mild peritoneal  thickening, likely malignant. Stable  DVT in the left common femoral vein. Electronically Signed   By: Julian Hy M.D.   On: 02/17/2020 16:18   CT ABDOMEN PELVIS W CONTRAST  Result Date: 02/06/2020 CLINICAL DATA:  Abdominal pain and fever. Metastatic pancreatic adenocarcinoma. EXAM: CT ABDOMEN AND PELVIS WITH CONTRAST TECHNIQUE: Multidetector CT imaging of the abdomen and pelvis was performed using the standard protocol following bolus administration of intravenous contrast. CONTRAST:  159m OMNIPAQUE IOHEXOL 300 MG/ML  SOLN COMPARISON:  CT 5 days ago 02/01/2020 FINDINGS: Lower chest: Stable lingular opacity. Slight increase in bibasilar atelectasis. No pleural fluid. Hepatobiliary: Innumerable hepatic metastasis. No significant change in the short interim. Layering sludge in the gallbladder. Mild gallbladder wall thickening. There is no biliary obstruction or discrete stone. Pancreas: Pancreatic head mass measures 3.4 cm prom distal pancreatic ductal dilatation with pancreatic atrophy unchanged. No adjacent inflammatory change. Spleen: Normal in size without focal abnormality. Adrenals/Urinary Tract: No adrenal nodule. The previous low-density band in the lower left kidney has improved and near completely resolved. There is homogeneous renal enhancement with symmetric excretion on delayed phase imaging. Unremarkable urinary bladder. Stomach/Bowel: Nondistended stomach. Pancreatic mass abuts the distal gastric body without gastric outlet obstruction. Small amount of enteric contrast is seen involving distal small bowel and throughout the colon. No obstruction. Suggestion of small bowel thickening and enhancement involving short segment in the left lower quadrant, series 2, image 71. There is improved submucosal low-density in the small bowel from prior. Submucosal fatty deposition of the ascending colon is again seen. Slight increase in submucosal fatty deposition involving the transverse and descending colon. Vascular/Lymphatic: Left femoral  DVT again seen. No progression from prior. No portal vein thrombosis. Normal caliber abdominal aorta. No acute vascular findings. Enlarged periportal node, largest measuring 14 mm, unchanged. No progressive adenopathy in the short interim. Reproductive: Hysterectomy.  Ovaries not delineated. Other: Small volume abdominopelvic ascites, which has slightly progressed from prior exam. Peritoneal thickening in the right pericolic gutter is again seen. Mild generalized omental and mesenteric edema. Patchy subcutaneous densities in the anterior abdominal wall with foci of air typical of medication injection sites. Musculoskeletal: No focal bone lesion or acute osseous abnormality. IMPRESSION: 1. Short segment loop of small bowel with wall thickening and mild enhancement in the left lower quadrant may represent focal enteritis. 2. Pancreatic head mass with innumerable hepatic metastasis. Peritoneal thickening in the right pericolic gutter is again seen. No significant change from CT 5 days ago. 3. Small volume abdominopelvic ascites, slightly progressed from prior exam. 4. Improved bandlike hypodensity in the lower left kidney, near completely resolved. 5. Gallbladder sludge. Gallbladder wall thickening or small amount of pericholecystic fluid, likely reactive. 6. Left femoral DVT again seen. Electronically Signed   By: MKeith RakeM.D.   On: 02/06/2020 19:48   CT ABDOMEN PELVIS W CONTRAST  Result Date: 02/01/2020 CLINICAL DATA:  Acute right flank pain.  Pancreatic cancer EXAM: CT ABDOMEN AND PELVIS WITH CONTRAST TECHNIQUE: Multidetector CT imaging of the abdomen and pelvis was performed using the standard protocol following bolus administration of intravenous contrast. CONTRAST:  1074mOMNIPAQUE IOHEXOL 350 MG/ML SOLN COMPARISON:  01/24/2020 FINDINGS: Lower chest:  Reported separately Hepatobiliary: Innumerable metastases throughout the liver. Gallbladder sludge or calculi is likely. No evidence of acute  cholecystitis. Pancreas: Mass at the pancreatic head with marked proximal ductal dilatation and atrophy. The mass measures approximately 3 cm. No acute superimposed inflammation. Spleen: Unremarkable. Adrenals/Urinary Tract: Negative adrenals. There is a band of low-density  in the posterior and lower left kidney. No hydronephrosis. Unremarkable bladder. Stomach/Bowel: Extensive submucosal low-density within small bowel proximal colon which appears fairly well-defined and has the appearance of fat deposition, also seen on prior but more prominent today. No bowel obstruction or clear bowel edema. Gastric outlet is distorted by the pancreatic mass, but no gastric dilatation. The appendix is not well visualized. No pericecal inflammation. Vascular/Lymphatic: DVT in the left common femoral vein which is resolved by the level of the common femoral bifurcation. This finding will be called in conjunction with chest CT findings. Prominent lymph nodes in the deep liver drainage about the pancreatic head, similar and likely metastatic. Reproductive:Hysterectomy. Other: Small volume ascites in the pelvis. Minimal interloop fluid is also seen. No discrete peritoneal nodularity but there is likely mild thickening of the peritoneum the upper right pericolic gutter. Musculoskeletal: No acute abnormalities. IMPRESSION: 1. DVT in the left common femoral vein, nonobstructive. 2. Band of hypoenhancement in the lower left kidney, contralateral to site of symptoms. Pyelonephritis is considered, please correlate with urinalysis. 3. Known pancreas cancer with extensive liver metastases. 4. Small volume ascites with possible peritoneal thickening, concerning for early peritoneal metastatic disease. 5. Gallbladder sludge or calculi. No evidence of acute cholecystitis. Electronically Signed   By: Monte Fantasia M.D.   On: 02/01/2020 04:47   CT Abdomen Pelvis W Contrast  Result Date: 01/24/2020 CLINICAL DATA:  Abdominal pain for the past  2 days. Constipation. Ongoing chemotherapy for pancreatic cancer. EXAM: CT ABDOMEN AND PELVIS WITH CONTRAST TECHNIQUE: Multidetector CT imaging of the abdomen and pelvis was performed using the standard protocol following bolus administration of intravenous contrast. CONTRAST:  174m OMNIPAQUE IOHEXOL 300 MG/ML  SOLN COMPARISON:  Abdomen MR dated 01/16/2020. Chest CTA dated 01/11/2020. Abdomen and pelvis CT dated 11/24/2019. FINDINGS: Lower chest: Mildly progressive linear and mass-like atelectasis at the left lung base with no significant change in linear atelectasis or scarring at the right lung base. Hepatobiliary: Large number of masses throughout the liver without significant change since 01/16/2020. Unremarkable gallbladder. Pancreas: No significant change in the mass in the pancreas at the level of the junction of the pancreatic head and body since 01/16/2020 with associated marked dilatation of the pancreatic duct. Spleen: Unremarkable. Adrenals/Urinary Tract: Adrenal glands are unremarkable. Kidneys are normal, without renal calculi, focal lesion, or hydronephrosis. Bladder is unremarkable. Stomach/Bowel: Minimal diffuse enlargement of the appendix with mild diffuse low density wall thickening without periappendiceal soft tissue stranding. The appendix measures 9 mm in maximum diameter, similar to 11/24/2019. Unremarkable stomach, small bowel and colon. No significant stool in the colon. Vascular/Lymphatic: No significant vascular findings are present. No enlarged abdominal or pelvic lymph nodes. Reproductive: Status post hysterectomy. No adnexal masses. Other: No abdominal wall hernia or abnormality. No abdominopelvic ascites. Musculoskeletal: L5-S1 degenerative changes. IMPRESSION: 1. No acute abnormality. 2. No significant change in the known pancreatic malignancy with associated marked dilatation of the pancreatic duct. 3. Stable extensive liver metastatic disease. Electronically Signed   By: SClaudie ReveringM.D.   On: 01/24/2020 10:57   DG Chest Port 1 View  Result Date: 02/17/2020 CLINICAL DATA:  Weakness and dehydration. Recent history of COVID-19. EXAM: PORTABLE CHEST 1 VIEW COMPARISON:  Chest radiograph 02/01/2020. FINDINGS: Port-A-Cath tip projects over the superior vena cava. Monitoring leads overlie the patient. Stable cardiac and mediastinal contours. Low lung volumes with bibasilar opacities favored represent atelectasis. No pleural effusion or pneumothorax. IMPRESSION: Low lung volumes with bibasilar atelectasis. Electronically Signed   By: DDian Situ  Rosana Hoes M.D.   On: 02/17/2020 13:29   DG Chest Port 1 View  Result Date: 02/01/2020 CLINICAL DATA:  Sepsis, right anterior chest pain, history of pancreatic cancer EXAM: PORTABLE CHEST 1 VIEW COMPARISON:  11/24/2019 FINDINGS: Single frontal view of the chest demonstrates stable left chest wall port. Cardiac silhouette is unremarkable. Lung volumes are diminished, with no acute airspace disease, effusion, or pneumothorax. No acute bony abnormalities. IMPRESSION: 1. Low lung volumes.  No acute process. Electronically Signed   By: Randa Ngo M.D.   On: 02/01/2020 01:32    ASSESSMENT AND PLAN: 1. Metastatic pancreatic cancer 2. Severe sepsis secondary to pyelonephritis 3. Abdominal pain, nausea and vomiting 4. Left iliac DVT, left peroneal and gastrocnemius vein DVT 5. Anemia 6. Thrombocytopenia 7. Transaminitis and hyperbilirubinemia secondary to #1 8. Protein calorie malnutrition 9. Goals of care discussion  -The patient has had significant disease progression as well as poor tolerance to chemotherapy. We do not recommend any additional chemotherapy because of this. Discussed prognosis of treatment with short life expectancy. Recommend shifting to comfort care and hospice. The patient did not wish to engage fully with his conversation today. We'll plan for a family meeting on Wednesday with the patient, her husband, and  brother. -Antibiotics per hospitalist for pyelonephritis. -The patient has fentanyl available to her as needed for abdominal pain. -Continue Zofran, Compazine, Ativan for nausea and vomiting. She is also on Marinol to help control nausea and to stimulate her appetite. -Continue Lovenox for DVT. -Monitor hemoglobin and platelets closely. Transfuse PRBCs for hemoglobin less than 7. -Monitor liver function closely. -Hold TPN for now in the setting of sepsis. Recommend dietitian consult -Will continue ongoing goals of care discussion with the patient and her family. We are working to arrange a family meeting for Wednesday of this week. Palliative care consultation is also pending.   LOS: 1 day   Mikey Bussing, DNP, AGPCNP-BC, AOCNP 02/18/20  Addendum  I have seen the patient, examined her. I agree with the assessment and and plan and have edited the notes.   I agree with antibiotics and supportive care. I again reviewed the overall prognosis with pt, and I do not recommend more chemo, given her worsening LFTs, poor PS and multiple cancer related symptoms. I recommend and will place a palliative care consult for her, they met her before. I discussed home hospice when she is ready to go home. She broke into tears and was quite overwhelmed which I antiquated given her history of anxiety. I suggested a family meeting with her husband and brother on Wednesday 12:15-1pm to discuss DNR and home hospice. She agreed. I called her husband and he plan to come, he will call pt's brother also.   Truitt Merle  02/18/2020

## 2020-02-19 DIAGNOSIS — R6520 Severe sepsis without septic shock: Secondary | ICD-10-CM | POA: Diagnosis not present

## 2020-02-19 DIAGNOSIS — A419 Sepsis, unspecified organism: Secondary | ICD-10-CM | POA: Diagnosis not present

## 2020-02-19 DIAGNOSIS — C259 Malignant neoplasm of pancreas, unspecified: Secondary | ICD-10-CM | POA: Diagnosis not present

## 2020-02-19 DIAGNOSIS — Z515 Encounter for palliative care: Secondary | ICD-10-CM | POA: Diagnosis not present

## 2020-02-19 DIAGNOSIS — Z7189 Other specified counseling: Secondary | ICD-10-CM

## 2020-02-19 LAB — CBC WITH DIFFERENTIAL/PLATELET
Abs Immature Granulocytes: 0.04 10*3/uL (ref 0.00–0.07)
Basophils Absolute: 0.1 10*3/uL (ref 0.0–0.1)
Basophils Relative: 1 %
Eosinophils Absolute: 0.1 10*3/uL (ref 0.0–0.5)
Eosinophils Relative: 1 %
HCT: 27.1 % — ABNORMAL LOW (ref 36.0–46.0)
Hemoglobin: 8.7 g/dL — ABNORMAL LOW (ref 12.0–15.0)
Immature Granulocytes: 1 %
Lymphocytes Relative: 16 %
Lymphs Abs: 1.1 10*3/uL (ref 0.7–4.0)
MCH: 31.9 pg (ref 26.0–34.0)
MCHC: 32.1 g/dL (ref 30.0–36.0)
MCV: 99.3 fL (ref 80.0–100.0)
Monocytes Absolute: 0.7 10*3/uL (ref 0.1–1.0)
Monocytes Relative: 10 %
Neutro Abs: 4.9 10*3/uL (ref 1.7–7.7)
Neutrophils Relative %: 71 %
Platelets: 157 10*3/uL (ref 150–400)
RBC: 2.73 MIL/uL — ABNORMAL LOW (ref 3.87–5.11)
RDW: 23.7 % — ABNORMAL HIGH (ref 11.5–15.5)
WBC: 6.8 10*3/uL (ref 4.0–10.5)
nRBC: 0.4 % — ABNORMAL HIGH (ref 0.0–0.2)

## 2020-02-19 LAB — COMPREHENSIVE METABOLIC PANEL
ALT: 81 U/L — ABNORMAL HIGH (ref 0–44)
AST: 157 U/L — ABNORMAL HIGH (ref 15–41)
Albumin: 1.8 g/dL — ABNORMAL LOW (ref 3.5–5.0)
Alkaline Phosphatase: 311 U/L — ABNORMAL HIGH (ref 38–126)
Anion gap: 8 (ref 5–15)
BUN: 8 mg/dL (ref 6–20)
CO2: 23 mmol/L (ref 22–32)
Calcium: 8.5 mg/dL — ABNORMAL LOW (ref 8.9–10.3)
Chloride: 102 mmol/L (ref 98–111)
Creatinine, Ser: 0.5 mg/dL (ref 0.44–1.00)
GFR, Estimated: >60 mL/min (ref >60)
Glucose, Bld: 86 mg/dL (ref 70–99)
Potassium: 3.5 mmol/L (ref 3.5–5.1)
Sodium: 133 mmol/L — ABNORMAL LOW (ref 135–145)
Total Bilirubin: 5.8 mg/dL — ABNORMAL HIGH (ref 0.3–1.2)
Total Protein: 6.9 g/dL (ref 6.5–8.1)

## 2020-02-19 MED ORDER — SODIUM CHLORIDE 0.9 % IV SOLN
2.0000 g | Freq: Four times a day (QID) | INTRAVENOUS | Status: AC
  Administered 2020-02-19 – 2020-02-26 (×31): 2 g via INTRAVENOUS
  Filled 2020-02-19 (×6): qty 2000
  Filled 2020-02-19 (×6): qty 2
  Filled 2020-02-19: qty 2000
  Filled 2020-02-19: qty 2
  Filled 2020-02-19: qty 2000
  Filled 2020-02-19: qty 2
  Filled 2020-02-19: qty 2000
  Filled 2020-02-19 (×4): qty 2
  Filled 2020-02-19: qty 2000
  Filled 2020-02-19 (×2): qty 2
  Filled 2020-02-19: qty 2000
  Filled 2020-02-19 (×5): qty 2
  Filled 2020-02-19: qty 2000
  Filled 2020-02-19 (×3): qty 2

## 2020-02-19 NOTE — Progress Notes (Signed)
Urine culture resulted , MD made aware with order updates.SRp, RN

## 2020-02-19 NOTE — Plan of Care (Signed)
  Problem: Education: Goal: Knowledge of General Education information will improve Description Including pain rating scale, medication(s)/side effects and non-pharmacologic comfort measures Outcome: Progressing   Problem: Health Behavior/Discharge Planning: Goal: Ability to manage health-related needs will improve Outcome: Progressing   

## 2020-02-19 NOTE — Plan of Care (Signed)

## 2020-02-19 NOTE — Progress Notes (Addendum)
PROGRESS NOTE  BRANDT PORTZ  [id number] DOB: 1960-11-11 DOA: 02/17/2020 PCP: Josetta Huddle, MD  Outpatient Specialists: Oncology, Dr. Burr Medico Brief Narrative: Cheila Glacken is a 60 y.o. female, with medical history of pancreatic cancer s/p chemotherapy, lupus, asthma, recent COVID-19 infection, which was treated with remdesivir in the hospital for 3 days during an admission for sepsis of unclear origin who has continued to have abdominal pain, nausea, and failure to thrive at home prompting return to the ED 1/23. She was tachycardic, tachypneic with CT abd/pelvis demonstrating known widespread cancer and mild heterogenous contrast uptake in kidneys bilaterally. She was given broad spectrum antibiotics and sepsis-dose IV fluids and admitted to SDU.  Assessment & Plan: Active Problems:   Severe sepsis ([place])  Goals of care discussion: The patient has advanced cancer which has progressed despite treatment (which she has tolerated poorly), now no longer on treatment of any kind. Hospice has been recommended. She agrees with me this morning that we need to aggressively treat her nausea and pain as our primary concern as the underlying problem is not a treatable one. She recognizes the grim prognosis but remains reluctant to discuss much further. We did not yet broach code status, though I'm hoping that with time and encouragement/support from Dr. Burr Medico and our palliative care medicine team, we will be able to relieve this patient's suffering.  - IV zofran prn; IV compazine prn; IV fentanyl prn > will need easily absorbable options at home.  - IV ativan added as well.  - Dr. Burr Medico to conduct family meeting 1/26.   Severe sepsis due to pyelonephritis: Diagnosed at admission based on tachycardia, tachypnea, abnormalities of kidneys on CT abdomen.  - Febrile 1/24. Urine culture growing Enterococcus faecalis > D/w pharmacy. Our antibiogram indicates overwhelming susceptibility to ampicillin which will be  ordered.  - Follow blood cultures, repeat cultures now since febrile.  Recent covid-19 infection: +SARS-CoV-2 testing 1/7. s/p Pfizer July, Aug 2021. Treated with some remdesivir, never hypoxic to require steroids. Has completed 10 days of isolation.  - DC isolation.  Acute left CFV DVT, chronic right-sided pulmonary emboli: Left iliac vein DVT (10/2019), DVT in the left peroneal and gastrocnemius veins (01/07/2020), DVT in the left common femoral vein (02/01/2020) - Lovenox 1mg /kg q12h  Hyponatremia:  - Continue LR, decrease rate.   Nausea, vomiting, intolerance to enteral feeding, GERD, severe protein calorie malnutrition: Prealbumin undetectable at recent hospitalization. - Maximize antiemetics, hold TPN for now.   Metastatic pancreatic cancer, elevated LFTs: Progressive despite chemotherapy which is also poorly tolerated. No longer on chemotherapy with plans to shift to hospice plan of care soon.  - Dr. Burr Medico added to treatment team.  - Palliative care consulted  Pancytopenia, anemia of malignancy:  - Hgb 7.6, will recheck now. Oncology recommending transfusion for hgb <7.   Thrombocytopenia: No evidence of bleeding currently.   SLE: No flare. Hold home meds for now  HTN:  - Restart metoprolol. Hold other home meds with softer BPs on admit. Restart when rising BP.  DVT prophylaxis: Lovenox Code Status: Full Family Communication: None at bedside; no answer when calling spouse today Disposition Plan:  Status is: Inpatient  Remains inpatient appropriate because:Ongoing active pain requiring inpatient pain management and IV treatments appropriate due to intensity of illness or inability to take PO  Dispo: The patient is from: Home              Anticipated d/c is to: Home with hospice  Anticipated d/c date is: 1 day. The patient fell a couple times during her last hospitalization and remains at very high risk of hospital-related morbidity. We will abbreviate inpatient  admission as much as possible.               Patient currently is not medically stable to d/c.   Difficult to place patient No  Consultants:   Oncology  Palliative care  Procedures:   None  Antimicrobials:  Vancomycin, cefepime > ceftriaxone > ampicillin  Subjective: Fever to 101.3 last night, felt chills. No other complaints, denying dysuria, urinary frequency or urgency. Her abdominal pain at baseline is migratory, diffuse, often centralized and is currently unchanged from that baseline. No new wounds or cough or dyspnea.   Objective: Vitals:   02/18/20 2154 02/19/20 0010 02/19/20 0442 02/19/20 1224  BP: (!) 143/84 121/86 119/88 132/89  Pulse: (!) 110 (!) 108 96 97  Resp: 20 20 20 14   Temp: (!) 101.3 F (38.5 C) 99.2 F (37.3 C) 98 F (36.7 C) 98.4 F (36.9 C)  TempSrc: Oral Oral Oral Oral  SpO2: 98% 97% 100% 100%  Weight:      Height:        Intake/Output Summary (Last 24 hours) at 02/19/2020 1256 Last data filed at 02/18/2020 1600 Gross per 24 hour  Intake 447.94 ml  Output 300 ml  Net 147.94 ml   Filed Weights   02/17/20 2345  Weight: 79.4 kg   Gen: Pleasant, chronically ill-appearing female in no distress Pulm: Nonlabored breathing room air. Clear. CV: Regular borderline tachycardia. No murmur, rub, or gallop. No JVD, no dependent edema. GI: Abdomen soft, diffusely tender without rebound, non-distended, with normoactive bowel sounds.  Ext: Warm, no deformities Skin: No new rashes, lesions or ulcers on visualized skin. Neuro: Alert and oriented. No focal neurological deficits. Psych: Judgement and insight appear fair. Mood euthymic & affect congruent. Behavior is appropriate.    Data Reviewed: I have personally reviewed following labs and imaging studies  CBC: Recent Labs  Lab 02/17/20 1236 02/18/20 0300  WBC 9.3 6.1  NEUTROABS 5.9  --   HGB 9.4* 7.6*  HCT 30.4* 24.0*  MCV 101.3* 100.8*  PLT 189 557*   Basic Metabolic Panel: Recent Labs   Lab 02/17/20 1236 02/18/20 0300  NA 135 133*  K 4.3 4.0  CL 101 103  CO2 20* 22  GLUCOSE 134* 71  BUN 21* 18  CREATININE 0.75 0.57  CALCIUM 8.4* 8.3*   GFR: Estimated Creatinine Clearance: 83.1 mL/min (by C-G formula based on SCr of 0.57 mg/dL). Liver Function Tests: Recent Labs  Lab 02/17/20 1236 02/18/20 0300  AST 150* 148*  ALT 78* 71*  ALKPHOS 241* 214*  BILITOT 4.9* 4.7*  PROT 7.8 6.5  ALBUMIN 2.1* 1.7*   No results for input(s): LIPASE, AMYLASE in the last 168 hours. No results for input(s): AMMONIA in the last 168 hours. Coagulation Profile: Recent Labs  Lab 02/17/20 1236  INR 1.4*   Cardiac Enzymes: No results for input(s): CKTOTAL, CKMB, CKMBINDEX, TROPONINI in the last 168 hours. BNP (last 3 results) No results for input(s): PROBNP in the last 8760 hours. HbA1C: No results for input(s): HGBA1C in the last 72 hours. CBG: Recent Labs  Lab 02/18/20 0540  GLUCAP 71   Lipid Profile: No results for input(s): CHOL, HDL, LDLCALC, TRIG, CHOLHDL, LDLDIRECT in the last 72 hours. Thyroid Function Tests: No results for input(s): TSH, T4TOTAL, FREET4, T3FREE, THYROIDAB in the last 72 hours.  Anemia Panel: No results for input(s): VITAMINB12, FOLATE, FERRITIN, TIBC, IRON, RETICCTPCT in the last 72 hours. Urine analysis:    Component Value Date/Time   COLORURINE AMBER (A) 02/17/2020 2020   APPEARANCEUR CLEAR 02/17/2020 2020   LABSPEC 1.033 (H) 02/17/2020 2020   PHURINE 8.0 02/17/2020 2020   GLUCOSEU NEGATIVE 02/17/2020 2020   HGBUR NEGATIVE 02/17/2020 2020   BILIRUBINUR NEGATIVE 02/17/2020 2020   KETONESUR NEGATIVE 02/17/2020 2020   PROTEINUR NEGATIVE 02/17/2020 2020   NITRITE NEGATIVE 02/17/2020 2020   LEUKOCYTESUR NEGATIVE 02/17/2020 2020   Recent Results (from the past 240 hour(s))  Blood Culture (routine x 2)     Status: None (Preliminary result)   Collection Time: 02/17/20  1:00 PM   Specimen: BLOOD  Result Value Ref Range Status   Specimen  Description   Final    BLOOD SITE NOT SPECIFIED Performed at [facility], [address], [place], [place] [address]    Special Requests   Final    BOTTLES DRAWN AEROBIC AND ANAEROBIC Blood Culture adequate volume Performed at [facility], [address] [place], [place] [address]    Culture   Final    NO GROWTH 1 DAY Performed at [facility], [address] [place], [place] [address]    Report Status PENDING  Incomplete  Urine culture     Status: Abnormal (Preliminary result)   Collection Time: 02/17/20  8:20 PM   Specimen: In/Out Cath Urine  Result Value Ref Range Status   Specimen Description   Final    IN/OUT CATH URINE Performed at [facility], [address] [place], [place] [address]    Special Requests   Final    NONE Performed at [facility], [address] [place], [place] [address]    Culture (A)  Final    40,000 COLONIES/mL ENTEROCOCCUS FAECALIS SUSCEPTIBILITIES TO FOLLOW Performed at [facility], [address], [place], [place] [address]    Report Status PENDING  Incomplete  MRSA PCR Screening     Status: None   Collection Time: 02/17/20 11:18 PM   Specimen: Nasal Mucosa; Nasopharyngeal  Result Value Ref Range Status   MRSA by PCR NEGATIVE NEGATIVE Final    Comment:        The GeneXpert MRSA Assay (FDA approved for NASAL specimens only), is one component of a comprehensive MRSA colonization surveillance program. It is not intended to diagnose MRSA infection nor to guide or monitor treatment for MRSA infections. Performed at [facility], [address], [place], [place] [address]       Radiology Studies: CT ABDOMEN PELVIS W CONTRAST  Result Date: 02/17/2020 CLINICAL DATA:  Abdominal pain, metastatic pancreatic cancer, COVID positive EXAM: CT ABDOMEN AND PELVIS WITH CONTRAST TECHNIQUE: Multidetector CT imaging of the abdomen and pelvis was performed  using the standard protocol following bolus administration of intravenous contrast. CONTRAST:  OMNIPAQUE IOHEXOL 300 MG/ML  SOLN COMPARISON:  02/06/2020 FINDINGS: Lower chest: Scarring/atelectasis in the lingula and left lower lobe. Hepatobiliary: Innumerable hepatic metastases, similar to recent CT. Gallbladder is unremarkable, noting mild gallbladder wall edema, similar. No intrahepatic or extrahepatic ductal dilatation. Pancreas: 3.9 x 2.9 cm mass in the pancreatic head (series 2/image 46), similar, corresponding to the patient's known pancreatic adenocarcinoma. Atrophy of the pancreatic body/tail with ductal dilatation. Spleen: Within normal limits. Adrenals/Urinary Tract: Adrenal glands are within normal limits. Segmental/wedge-shaped hypoperfusion involving the posterolateral right lower kidney (series  2/images 51-52). Associated differential enhancement involving the posterior left lower kidney (series 7/image 43). These findings are nonspecific but are most suggestive of pyelonephritis. No hydronephrosis. Bladder is within normal limits. Stomach/Bowel: Stomach is within normal limits. No evidence of bowel obstruction. Appendix is not discretely visualized. No colonic wall thickening or inflammatory changes. Vascular/Lymphatic: No evidence of abdominal aortic aneurysm. Stable DVT in the left common femoral vein (series 2/image 93). 14 mm short axis portacaval node (series 2/image 41), unchanged. No suspicious pelvic lymphadenopathy. Reproductive: Status post hysterectomy. No adnexal masses. Other: Small to moderate abdominopelvic ascites. Associated mild pelvic peritoneal thickening. Musculoskeletal: Mild degenerative changes at L5-S1. IMPRESSION: Mildly heterogeneous perfusion involving the bilateral kidneys, nonspecific but most suggestive of pyelonephritis. Otherwise, no interval change from recent CT. Pancreatic adenocarcinoma. Innumerable hepatic metastases. Small upper abdominal nodal metastasis.  Small to moderate abdominopelvic ascites with mild peritoneal thickening, likely malignant. Stable DVT in the left common femoral vein. Electronically Signed   By: Julian Hy M.D.   On: 02/17/2020 16:18   DG Chest Port 1 View  Result Date: 02/17/2020 CLINICAL DATA:  Weakness and dehydration. Recent history of COVID-19. EXAM: PORTABLE CHEST 1 VIEW COMPARISON:  Chest radiograph 02/01/2020. FINDINGS: Port-A-Cath tip projects over the superior vena cava. Monitoring leads overlie the patient. Stable cardiac and mediastinal contours. Low lung volumes with bibasilar opacities favored represent atelectasis. No pleural effusion or pneumothorax. IMPRESSION: Low lung volumes with bibasilar atelectasis. Electronically Signed   By: Lovey Newcomer M.D.   On: 02/17/2020 13:29    Scheduled Meds: . Chlorhexidine Gluconate Cloth  6 each Topical Daily  . dronabinol  2.5 mg Oral BID AC  . enoxaparin  80 mg Subcutaneous Q12H  . feeding supplement  237 mL Oral BID BM  . mouth rinse  15 mL Mouth Rinse BID  . metoprolol succinate  50 mg Oral Daily  . sucralfate  1 g Oral TID WC & HS   Continuous Infusions: . ampicillin (OMNIPEN) IV    . lactated ringers 100 mL/hr at 02/18/20 2144     LOS: 2 days   Time spent: 35 minutes.  Patrecia Pour, MD [facility] [email or web address] 02/19/2020, 12:56 PM

## 2020-02-19 NOTE — Progress Notes (Signed)
Initial Nutrition Assessment  DOCUMENTATION CODES:   Not applicable  INTERVENTION:   -decrease Ensure Enlive po BID (each supplement provides 350 kcal and 20 g of protein) mixed with milk  -start Boost Breeze once per day (each supplement provides 250 kcal and 9 g protein) -encourage PO intake as tolerated   NUTRITION DIAGNOSIS:   Increased nutrient needs related to cancer and cancer related treatments as evidenced by estimated needs.  GOAL:   Patient will meet greater than or equal to 90% of their needs  MONITOR:   PO intake,Supplement acceptance,Labs,Skin  REASON FOR ASSESSMENT:   Malnutrition Screening Tool,Consult Poor PO  ASSESSMENT:   61 YOF with PMH of pancreatic cancer s/p chemotherapy and recent COVID infection. Pt presented to the ED with abdominal pain, nausea and failure to thrive. Pt was admitted for sepsis of unclear origin  Pt explained that while in the hospital and while at home she has had poor PO intake. Pt's note indicates she had eaten 25% of meal on 01/24. She explained that today (01/25) she had eaten applesauce, however she expressed concern with eating the [place] served due to dryness and toughness of the meat. Pt did not express any chewing or swallowing difficulties.    Pt is currently receiving Ensure Enlive (237 mL) mixed with milk BID, however she said that it tends to cause GI distress due to the creaminess. Intern discussed options such as Boost Breeze as an alternative to the creamy supplements. Pt expressed interest in having Boost Breeze once per day, but also noted she would still like to continue Ensure Enlive mixed with milk once per day as well.   Pt has been having cyclic TPN nightly. Pt has not had TPN since Sunday AM. Pt explained that she experiences n/v if she does not take anti-nausea medication prior to turning off TPN each morning.   Pt explained did not know her UBW but recalled it to be something in the 200s. She did feel she  had lost weight since her cancer diagnosis. Wt trends indicate a stable weight for the past 3 months with outliers 01/24/20 and 01/29/20.  Pt explained that she has recently become less mobile, but does have a walker at home to use as needed.   Noted that pt is on an appetite stimulant, Marinol BID, however this medication is new as of at least 02/04/20.  Meds Reviewed: Ensure Enlive BID, marinol BID, carafate (1 GM, TID) Labs Reviewed: 01/24 - sodium (133 mmol/L), calcium (8.3mg /dL)   NUTRITION - FOCUSED PHYSICAL EXAM:  Flowsheet Row Most Recent Value  Orbital Region No depletion  Upper Arm Region No depletion  Thoracic and Lumbar Region No depletion  Buccal Region Mild depletion  Temple Region Mild depletion  Clavicle Bone Region No depletion  Clavicle and Acromion Bone Region No depletion  Scapular Bone Region No depletion  Dorsal Hand No depletion  Patellar Region No depletion  Anterior Thigh Region No depletion  Posterior Calf Region No depletion  Edema (RD Assessment) None  Hair Reviewed  Eyes Reviewed  Mouth Unable to assess  Skin Reviewed  Nails Reviewed       Diet Order:   Diet Order            Diet regular Room service appropriate? Yes; Fluid consistency: Thin  Diet effective now                 EDUCATION NEEDS:   No education needs have been identified at this time  Skin:  Skin Assessment: Reviewed RN Assessment  Last BM:  01/25 (type 5)  Height:   Ht Readings from Last 1 Encounters:  02/17/20 5\' 7"  (1.702 m)    Weight:   Wt Readings from Last 1 Encounters:  02/17/20 79.4 kg    Ideal Body Weight:  61.4 kg  BMI:  Body mass index is 27.42 kg/m.  Estimated Nutritional Needs:   Kcal:  2200-2400  Protein:  120-135  Fluid:  >2.1 L    Salvadore Oxford, Dietetic Intern 02/19/2020 3:22 PM

## 2020-02-19 NOTE — Consult Note (Signed)
Consultation Note Date: 02/19/2020   Patient Name: Cynthia Frye  DOB: 1960-02-14  MRN: [id number]  Age / Sex: 60 y.o., female  PCP: Josetta Huddle, MD Referring Physician: Patrecia Pour, MD  Reason for Consultation: Establishing goals of care  HPI/Patient Profile: 60 y.o. female  admitted on 02/17/2020    Clinical Assessment and Goals of Care: Cynthia Frye is a 60 year old female with metastatic pancreatic cancer, lupus, asthma, recent COVID-33 infection who presented to the hospital with continued abdominal pain, nausea, and failure to thrive.  CT demonstrates widespread cancer with heterogenous contrast uptake in the kidneys.  She is currently being treated with antibiotics for severe sepsis due to pyelonephritis.  I know her from prior admission where we discussed her severe nausea.  I saw and examined Cynthia Frye today.  Her husband, [name], is at the bedside as well.  We discussed the continued changes she has had in her functional status since the last time that we met.  She continues to endorse having high symptom burden including bloating and nausea.  She has been on multiple medications without feeling significant relief.  We discussed medication regimen she has tried in the past.  She becomes tired of speaking with me and frustrated with continued questions about symptom management.  I gently attempted to discuss cancer progression and worsening of her disease burden.  Her husband stated at that point that Dr. Burr Medico is planning to come back tomorrow at noon to discuss further with them.  He suggested that I either come back at that time will plan to meet with them after they have had a chance to discuss further with Dr. Burr Medico  NEXT OF KIN Lives at home with husband in [place], [place].  She has a brother and some extended family also in the area.  SUMMARY OF RECOMMENDATIONS   - Plan for family  meeting tomorrow at 12:15 with Dr. Burr Medico.  Her husband is at the bedside at time and requests that I come back either at that time or tomorrow after they have had a chance to discuss with Dr. Burr Medico.  Code Status/Advance Care Planning:  Full code    Symptom Management:   As above, medication history reviewed.  Palliative Prophylaxis:   Delirium Protocol  Additional Recommendations (Limitations, Scope, Preferences):  Full Scope Treatment  Psycho-social/Spiritual:   Desire for further Chaplaincy support:yes  Additional Recommendations: Caregiving  Support/Resources  Prognosis:   <6 months and she should qualify for hospice services moving forward if so desired.  Discharge Planning: To Be Determined      Primary Diagnoses: Present on Admission: . Severe sepsis ([place])   I have reviewed the medical record, interviewed the patient and family, and examined the patient. The following aspects are pertinent.  Past Medical History:  Diagnosis Date  . Allergies    peanuts, corn, beans, red grapefruit, naval oranges  . Asthma    allergy shots and medication  . Cancer [facility])    pancreatic  . Collagen vascular disease ([place] )   .  DDD (degenerative disc disease), lumbar   . Eustachian tube dysfunction 12/2012   rhinitis, vertigo- Dr. Wilburn Cornelia, ENT   . Fibroids   . Heart murmur    Echo 1/18: EF 55-60, no RWMA, normal diastolic function, trivial AI, PASP 32  . History of cardiac catheterization    LHC 3/18: normal coronary arteries.   . History of nuclear stress test    ETT-Myoview 2/18: EF 62, + ECG response; apical and distal septal ischemia; intermediate risk.  Marland Kitchen Hypertension   . Iron deficiency anemia   . Lupus [facility]) 2011   Dr. Trudie Reed  . Migraine headache    trial of generic maxalt 10 mg, January 2021  . Obesity   . Seasonal allergies   . Seizure in childhood [facility])    as a child no treatment none x 30 years   Social History   Socioeconomic History  . Marital  status: Married    Spouse name: Not on file  . Number of children: 0  . Years of education: Not on file  . Highest education level: Some college, no degree  Occupational History  . Occupation: Customer service manager   Tobacco Use  . Smoking status: Never Smoker  . Smokeless tobacco: Never Used  Vaping Use  . Vaping Use: Never used  Substance and Sexual Activity  . Alcohol use: No  . Drug use: No  . Sexual activity: Not on file  Other Topics Concern  . 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 Asthma Mother   . Diabetes Mother   . Hypertension Mother   . Heart attack Father 34  . CAD Father   . Stroke Maternal Aunt   . Stroke Paternal Aunt   . Stroke Paternal Uncle   . Gout Other        uncles   . Cancer Niece        paternal half sister's daughter; unknown type; unknown age diagnosed  . Ovarian cancer Neg Hx   . Breast cancer Neg Hx   . Colon cancer Neg Hx   . Migraines Neg Hx    Scheduled Meds: . Chlorhexidine Gluconate Cloth  6 each Topical Daily  . dronabinol  2.5 mg Oral BID AC  . enoxaparin  80 mg Subcutaneous Q12H  . feeding supplement  237 mL Oral BID BM  . mouth rinse  15 mL Mouth Rinse BID  . metoprolol succinate  50 mg Oral Daily  . sucralfate  1 g Oral TID WC & HS   Continuous Infusions: . ampicillin (OMNIPEN) IV 2 g (02/19/20 1812)  . lactated ringers 100 mL/hr at 02/18/20 2144   PRN Meds:.acetaminophen **OR** acetaminophen, fentaNYL (SUBLIMAZE) injection, LORazepam,  ondansetron (ZOFRAN) IV, prochlorperazine, topiramate Medications Prior to Admission:  Prior to Admission medications   Medication Sig Start Date End Date Taking? Authorizing Provider  Cholecalciferol (VITAMIN D) 2000 units CAPS Take 2,000 Units by mouth  daily.    Yes [provider]  dexamethasone (DECADRON) 4 MG tablet Take 1 tablet (4 mg total) by mouth daily. Take 1 tablet once daily for 3 to 5 days after chemo 10/29/19  Yes Alla Feeling, NP  diclofenac (VOLTAREN) 75 MG EC tablet Take 1 tablet (75 mg total) by mouth 2 (two) times daily as needed. Patient taking differently: Take 75 mg by mouth 2 (two) times daily. 12/12/19  Yes Shelly Coss, MD  Dietary Management Product (RHEUMATE) CAPS Take 1 capsule by mouth daily.    Yes [provider]  diphenoxylate-atropine (LOMOTIL) 2.5-0.025 MG tablet Take 2 tablets by mouth 4 (four) times daily as needed for diarrhea or loose stools (use 2nd, if Imodium doesn't work. Max dose: 8 tablets/day.). Patient taking differently: Take 2 tablets by mouth 4 (four) times daily as needed for diarrhea or loose stools. 11/09/19  Yes Orson Slick, MD  dronabinol (MARINOL) 2.5 MG capsule Take 1 capsule (2.5 mg total) by mouth 2 (two) times daily before a meal. Patient taking differently: Take 2.5 mg by mouth 2 (two) times daily as needed (nausea). 11/14/19  Yes Truitt Merle, MD  enoxaparin (LOVENOX) 80 MG/0.8ML injection Inject 0.8 mLs (80 mg total) into the skin every 12 (twelve) hours. 01/09/20 04/08/20 Yes Alla Feeling, NP  ferrous sulfate 325 (65 FE) MG tablet Take 325 mg by mouth daily with breakfast.   Yes [provider]  fluticasone (FLONASE) 50 MCG/ACT nasal spray Place 2 sprays into both nostrils daily. Patient taking differently: Place 2 sprays into both nostrils daily as needed for allergies. 04/29/16  Yes Jeffery, Domingo Mend, PA  hydroxychloroquine (PLAQUENIL) 200 MG tablet Take 400 mg by mouth daily.  06/17/14  Yes [provider]  loperamide (IMODIUM) 2 MG capsule Take 2 capsules (4 mg total) by mouth 2 (two) times daily as needed for diarrhea or loose stools (Use 1st). 11/09/19  Yes Hongalgi, Lenis Dickinson, MD  LORazepam (ATIVAN) 0.5 MG tablet Take 1 tablet (0.5 mg total) by mouth every 8 (eight) hours as needed for anxiety (and anticiaptory n/v. DO NOT TAKE WITH XANAX). 11/21/19  Yes Alla Feeling, NP  metoprolol succinate (TOPROL-XL) 50 MG 24 hr tablet Take 50 mg by mouth daily.    Yes [provider]  mirtazapine (REMERON) 7.5 MG tablet Take 1 tablet (7.5 mg total) by mouth at bedtime. 01/08/20  Yes Alla Feeling, NP  OLANZapine (ZYPREXA) 7.5 MG tablet Take 1 tablet (7.5 mg total) by mouth at bedtime. 12/12/19  Yes Shelly Coss, MD  ondansetron (ZOFRAN) 8 MG tablet Take 1 tablet (8 mg total) by mouth every 8 (eight) hours as needed for nausea or vomiting. 12/24/19  Yes Truitt Merle, MD  oxyCODONE (OXY IR/ROXICODONE) 5 MG immediate release tablet Take 1 tablet (5 mg total) by mouth every 6 (six) hours as needed for severe pain. 12/18/19  Yes Truitt Merle, MD  senna-docusate (SENOKOT-S) 8.6-50 MG tablet Take 1 tablet by mouth 2 (two) times daily. Patient taking differently: Take 1 tablet by mouth 2 (two) times daily as needed for mild constipation. 12/12/19  Yes Shelly Coss, MD  sucralfate (CARAFATE) 1 GM/10ML suspension Take 10 mLs (1 g total) by mouth 4 (four) times daily -  with meals and at bedtime. 12/12/19  Yes Shelly Coss, MD  topiramate (TOPAMAX) 50 MG tablet Take 1 tablet (50 mg total) by mouth at bedtime. Patient taking differently: Take 50 mg by mouth daily as needed (migraines).  11/09/19  Yes Hongalgi, Lenis Dickinson, MD  feeding supplement (ENSURE ENLIVE / ENSURE PLUS) LIQD Take 237 mLs by mouth 2 (two) times daily between meals. Patient not taking: Reported on 01/15/2020 11/09/19   Modena Jansky, MD  lipase/protease/amylase [phone] units CPEP Take 1 capsule (24,000 Units total) by  mouth 3 (three) times daily with meals. 12/12/19   Shelly Coss, MD  polyethylene glycol (MIRALAX / GLYCOLAX) 17 g packet Take 17 g by mouth daily. Patient not taking: Reported on 01/15/2020 12/13/19   Shelly Coss, MD  potassium chloride 20 MEQ/15ML (10%) SOLN Take 15 mLs (20 mEq total) by mouth 2 (two) times daily. Patient not taking: Reported on 01/15/2020 11/21/19   Alla Feeling, NP  prochlorperazine (COMPAZINE) 10 MG tablet Take 1 tablet (10 mg total) by mouth every 6 (six) hours as needed (Nausea or vomiting). Patient taking differently: Take 10 mg by mouth every 6 (six) hours as needed for nausea or vomiting.  10/18/19 12/18/19  Truitt Merle, MD   Allergies  Allergen Reactions  . Cinnamon Other (See Comments)    Other reaction(s): Other (See Comments) Unknown  On allergy test Unknown  On allergy test  . Peanut-Containing Drug Products Hives  . Prednisone Other (See Comments)    Interacts with another medicine she is taking  . Corn Oil Hives  . Corn-Containing Products Hives  . Other Rash    Red grapefruit and naval oranges- lips tingling and facial rash Potatoes, tomatoes, garlic, oregano/basil caused headache Other reaction(s): migratory headache   Review of Systems Denies pain Physical Exam Patient is awake alert resting in bed has a mask on her face, anxious Regular work of breathing S1-S2 Abdomen is soft nondistended nontender No edema on extremities No focal deficits  Vital Signs: BP 132/89 (BP Location: Right Arm)   Pulse 97   Temp 98.4 F (36.9 C) (Oral)   Resp 14   Ht _0  (1.702 m)   Wt 79.4 kg   SpO2 100%   BMI 27.42 kg/m  Pain Scale: 0-10   Pain Score: Asleep   SpO2: SpO2: 100 % O2 Device:SpO2: 100 % O2 Flow Rate: .   IO: Intake/output summary:   Intake/Output Summary (Last 24 hours) at 02/19/2020 1838 Last data filed at 02/19/2020 1600 Gross per 24 hour  Intake 2378.68 ml  Output --  Net 2378.68 ml    LBM: Last BM Date:  02/15/20 Baseline Weight: Weight: 79.4 kg Most recent weight: Weight: 79.4 kg     Palliative Assessment/Data:   Flowsheet Rows   Flowsheet Row Most Recent Value  Intake Tab   Referral Department Hospitalist  Unit at Time of Referral Med/Surg Unit  Palliative Care Primary Diagnosis Cancer  Date Notified 02/18/20  Palliative Care Type Return patient Palliative Care  Reason for referral Non-pain Symptom, Clarify Goals of Care  Date of Admission 02/17/20  Date first seen by Palliative Care 02/19/20  # of days Palliative referral response time 1 Day(s)  # of days IP prior to Palliative referral 1  Clinical Assessment   Palliative Performance Scale Score 30%  Psychosocial & Spiritual Assessment   Palliative Care Outcomes   Patient/Family meeting held? Yes  Who was at the meeting? patient, husband    PPS 50%  Time In:  1700 Time Out: 1800 Time Total:  60  Greater than 50%  of this time was spent counseling and coordinating care related to the above assessment and plan.  Signed by: Micheline Rough, MD  Please contact Palliative Medicine Team phone at [phone] for questions and concerns.  For individual provider: See Shea Evans

## 2020-02-20 DIAGNOSIS — Z515 Encounter for palliative care: Secondary | ICD-10-CM | POA: Diagnosis not present

## 2020-02-20 DIAGNOSIS — N12 Tubulo-interstitial nephritis, not specified as acute or chronic: Secondary | ICD-10-CM

## 2020-02-20 DIAGNOSIS — C787 Secondary malignant neoplasm of liver and intrahepatic bile duct: Secondary | ICD-10-CM

## 2020-02-20 DIAGNOSIS — A419 Sepsis, unspecified organism: Secondary | ICD-10-CM | POA: Diagnosis not present

## 2020-02-20 DIAGNOSIS — C259 Malignant neoplasm of pancreas, unspecified: Secondary | ICD-10-CM

## 2020-02-20 DIAGNOSIS — R6520 Severe sepsis without septic shock: Secondary | ICD-10-CM | POA: Diagnosis not present

## 2020-02-20 DIAGNOSIS — Z7189 Other specified counseling: Secondary | ICD-10-CM | POA: Diagnosis not present

## 2020-02-20 LAB — CBC WITH DIFFERENTIAL/PLATELET
Abs Immature Granulocytes: 0.05 10*3/uL (ref 0.00–0.07)
Basophils Absolute: 0.1 10*3/uL (ref 0.0–0.1)
Basophils Relative: 1 %
Eosinophils Absolute: 0.1 10*3/uL (ref 0.0–0.5)
Eosinophils Relative: 1 %
HCT: 24.7 % — ABNORMAL LOW (ref 36.0–46.0)
Hemoglobin: 8 g/dL — ABNORMAL LOW (ref 12.0–15.0)
Immature Granulocytes: 1 %
Lymphocytes Relative: 19 %
Lymphs Abs: 1.3 10*3/uL (ref 0.7–4.0)
MCH: 31.5 pg (ref 26.0–34.0)
MCHC: 32.4 g/dL (ref 30.0–36.0)
MCV: 97.2 fL (ref 80.0–100.0)
Monocytes Absolute: 0.7 10*3/uL (ref 0.1–1.0)
Monocytes Relative: 11 %
Neutro Abs: 4.6 10*3/uL (ref 1.7–7.7)
Neutrophils Relative %: 67 %
Platelets: 151 10*3/uL (ref 150–400)
RBC: 2.54 MIL/uL — ABNORMAL LOW (ref 3.87–5.11)
RDW: 23.1 % — ABNORMAL HIGH (ref 11.5–15.5)
WBC: 6.8 10*3/uL (ref 4.0–10.5)
nRBC: 0.7 % — ABNORMAL HIGH (ref 0.0–0.2)

## 2020-02-20 LAB — COMPREHENSIVE METABOLIC PANEL
ALT: 76 U/L — ABNORMAL HIGH (ref 0–44)
AST: 147 U/L — ABNORMAL HIGH (ref 15–41)
Albumin: 1.7 g/dL — ABNORMAL LOW (ref 3.5–5.0)
Alkaline Phosphatase: 336 U/L — ABNORMAL HIGH (ref 38–126)
Anion gap: 10 (ref 5–15)
BUN: 8 mg/dL (ref 6–20)
CO2: 22 mmol/L (ref 22–32)
Calcium: 8.3 mg/dL — ABNORMAL LOW (ref 8.9–10.3)
Chloride: 101 mmol/L (ref 98–111)
Creatinine, Ser: 0.48 mg/dL (ref 0.44–1.00)
GFR, Estimated: >60 mL/min (ref >60)
Glucose, Bld: 78 mg/dL (ref 70–99)
Potassium: 3.1 mmol/L — ABNORMAL LOW (ref 3.5–5.1)
Sodium: 133 mmol/L — ABNORMAL LOW (ref 135–145)
Total Bilirubin: 6.3 mg/dL — ABNORMAL HIGH (ref 0.3–1.2)
Total Protein: 6.7 g/dL (ref 6.5–8.1)

## 2020-02-20 LAB — URINE CULTURE: Culture: 40000 — AB

## 2020-02-20 MED ORDER — HYDROMORPHONE HCL 1 MG/ML IJ SOLN
0.5000 mg | INTRAMUSCULAR | Status: DC | PRN
  Administered 2020-02-20 – 2020-02-21 (×3): 0.5 mg via INTRAVENOUS
  Filled 2020-02-20 (×3): qty 0.5

## 2020-02-20 MED ORDER — POTASSIUM CHLORIDE CRYS ER 20 MEQ PO TBCR
40.0000 meq | EXTENDED_RELEASE_TABLET | Freq: Once | ORAL | Status: AC
  Administered 2020-02-20: 40 meq via ORAL
  Filled 2020-02-20: qty 2

## 2020-02-20 MED ORDER — LIP MEDEX EX OINT
TOPICAL_OINTMENT | CUTANEOUS | Status: DC | PRN
  Filled 2020-02-20 (×3): qty 7

## 2020-02-20 NOTE — Progress Notes (Addendum)
HEMATOLOGY-ONCOLOGY PROGRESS NOTE  SUBJECTIVE: Cynthia Frye still has uncontrolled pain, nausea and fatigue, no fever or other new complains. Dr. Domingo Cocking and I met pt and her husband together today around noon for family meeting   Oncology History Overview Note  Cancer Staging Pancreatic cancer [facility]) Staging form: Exocrine Pancreas, AJCC 8th Edition - Clinical stage from 10/18/2019: Stage IV (cT3, cN1, cM1) - Signed by Truitt Merle, MD on 10/18/2019    Pancreatic cancer ([place])  09/19/2019 Imaging   CT AP w contrast 09/19/19  IMPRESSION: 1. Findings are highly concerning for probable primary pancreatic adenocarcinoma in the anterior aspect of the pancreatic head. Several prominent borderline enlarged lymph nodes are noted in the hepatoduodenal nodal station, and there are multiple indeterminate liver lesions which are highly concerning for probable hepatic metastases. Further evaluation with nonemergent abdominal MRI with and without IV gadolinium with MRCP is recommended in the near future to better evaluate these findings.   09/25/2019 Procedure   Upper EUS by Dr Paulita Fujita  IMPRESSION -There was no evidence of significant pathology in the left lobe of the liver.  -A few lymph nodes were visualized and measures in the peripancreatic region and porta hepata region.  -Hyperchoic material consistent with sludge was visualized endosonographically in the gallbladder.  -There was no sign of significant pathology in the common bile duct.  -A mass was identified in the pancreatic head. If biopsy results show adenocarcinoma, it would be staged T3N1Mx by endosonographic criteria. Fine needle aspiration performed.    09/25/2019 Initial Biopsy   FINAL MICROSCOPIC DIAGNOSIS: Fine needle aspirate, Pancreas;  MALIGNANT CELLS PRESENT CONSISTENT WITH ADENOCARCINOMA.    10/02/2019 Initial Diagnosis   Pancreatic cancer ([place])   10/11/2019 Procedure   PAC placement y Dr Barry Dienes    10/15/2019 Imaging   CT Chest   IMPRESSION: 1. Small anterior left pneumothorax with dependent atelectasis in the left lower lobe. 2. Increased number of bilateral axillary and subpectoral lymph nodes with mild lymphadenopathy in the left axilla. While this would be an atypical presentation for metastatic pancreatic cancer, this possibility is not excluded 3. Main duct dilatation in the pancreas, better assessed on abdomen CT 09/19/2019.   10/16/2019 Imaging   MRI Abdomen  IMPRESSION: 1. Substantially motion degraded scan. 2. Probable persistent small anterior left lung base pneumothorax, better seen on chest CT from 1 day prior. 3. Poorly marginated hypoenhancing 3.7 x 2.9 cm pancreatic head mass, which appears to invade the anterior peripancreatic fat, compatible with known pancreatic adenocarcinoma. Diffuse irregular dilatation of the main pancreatic duct in the pancreatic body and tail. Mild narrowing of the main portal vein by the mass. Abdominal vasculature remains patent and otherwise uninvolved. 4. Numerous (greater than 10) small liver masses scattered throughout the liver, largest 1.0 cm, which appear to demonstrate targetoid enhancement on the limited motion degraded postcontrast sequences, compatible with liver metastases. 5. Mild porta hepatis adenopathy, suspicious for metastatic disease.   10/18/2019 Cancer Staging   Staging form: Exocrine Pancreas, AJCC 8th Edition - Clinical stage from 10/18/2019: Stage IV (cT3, cN1, cM1) - Signed by Truitt Merle, MD on 10/18/2019   10/24/2019 - 11/21/2019 Chemotherapy   First-line FOLFIRINOX q2weeks starting 10/24/19. C2 postponed due to N/V/D and dose reduced 20-30%. Given poor tolerance, stopped after 2 cycles.    10/30/2019 Pathology Results   FINAL MICROSCOPIC DIAGNOSIS:   A. LIVER, LESION, BIOPSY:  -  Metastatic carcinoma  -  See comment   COMMENT:   By immunohistochemistry, the neoplastic cells are positive  for  cytokeratin 7 and GATA3 with patchy  nonspecific staining for PAX 8 but  negative for TTF-1, CDX2 and cytokeratin 20.  Overall, the findings are  consistent with metastasis of the patient's known breast carcinoma.  Prognostic panel (ER, PR, Her-2) is pending and will be reported in an  addendum.    ADDENDUM:   Dr. Laurence Ferrari notified us (November 01, 2019) that the patient was also  being worked up for a pancreatic mass.  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  In addition, ER and PR  are negative.  Gata-3 can be expressed in the pancreatic  adenocarcinomas; therefore, pancreatobiliary primary remains in the  differential.    11/02/2019 Genetic Testing   Negative genetic testing: no pathogenic variants detected in [facility] Common Hereditary Cancers Panel.  The report date is November 02, 2019.   The Common Hereditary Cancers Panel offered by [facility] includes sequencing and/or deletion duplication testing of the following 48 genes: APC, ATM, AXIN2, BARD1, BMPR1A, BRCA1, BRCA2, BRIP1, CDH1, CDK4, CDKN2A (p14ARF), CDKN2A (p16INK4a), CHEK2, CTNNA1, DICER1, EPCAM (Deletion/duplication testing only), GREM1 (promoter region deletion/duplication testing only), KIT, MEN1, MLH1, MSH2, MSH3, MSH6, MUTYH, NBN, NF1, NHTL1, PALB2, PDGFRA, PMS2, POLD1, POLE, PTEN, RAD50, RAD51C, RAD51D, RNF43, SDHB, SDHC, SDHD, SMAD4, SMARCA4. STK11, TP53, TSC1, TSC2, and VHL.  The following genes were evaluated for sequence changes only: SDHA and HOXB13 c.251G>A variant only.   11/04/2019 Imaging   CT AP  IMPRESSION: 1. Circumferential bowel wall thickening with adjacent fat stranding throughout the colon, most predominant in the transverse colon. This is consistent with colitis, which may be infectious or inflammatory in etiology. 2. Ill-defined pancreatic head mass consistent with known malignancy, similar to mildly increased in comparison to prior CT. There are innumerable hypodense masses throughout the  liver, increased in conspicuity in comparison to prior CT. Findings are worrisome for worsening metastatic disease. 3. Filling defect in the LEFT internal iliac vein, likely a thrombus with differential considerations including mixing artifact.   11/24/2019 Imaging   CT AP  IMPRESSION: Pancreatic head mass again noted compatible with patient's given history of pancreatic cancer.   Numerous metastases throughout the liver, enlarging since prior study.     12/10/2019 Procedure   Upper endoscopy by Dr Michail Sermon  IMPRESSION - Z-line regular, 42 cm from the incisors. - Esophageal ulcer with stigmata of recent bleeding. - Gastritis. Biopsied. - Mucosal changes in the duodenum. Biopsied. - Normal second portion of the duodenum. - Gastric and duodenal thickening concerning for inflammation from mets or due to mets from known pancreatic cancer.   FINAL MICROSCOPIC DIAGNOSIS:   A. DUODENUM, BIOPSY:  - Benign duodenal mucosa.  - No dysplasia or malignancy.   B. STOMACH, BIOPSY:  - Antral mucosa with mild chronic active inflammation.  - No intestinal metaplasia, dysplasia or carcinoma.   12/28/2019 -  Chemotherapy   Change her to second-line Gemcitabine 2 weeks on/1 week off starting 12/28/19.    01/16/2020 Imaging   MRI abdomen  IMPRESSION: 1. Prominently dilated dorsal pancreatic duct extending to the level of the mass in the junction of the pancreatic head and body. The mass abuts the gastric wall, portal vein, and common bile duct, leading to mild narrowing of the common bile duct down to about 0.3 cm, with the common hepatic duct mildly prominent 0.9 cm. The mass has mildly enlarged compared to the CT examination of 11/24/2019. 2. Extensive metastatic burden throughout all lobes of the liver, substantially  increased from 11/24/2019 and fairly similar to 01/11/2020. 3. Trace left pleural effusion with adjacent atelectasis in the left lower lobe. Borderline cardiomegaly. 4.  Sludge in the gallbladder with gallbladder wall thickening.   01/24/2020 Imaging   CT AP  IMPRESSION: 1. No acute abnormality. 2. No significant change in the known pancreatic malignancy with associated marked dilatation of the pancreatic duct. 3. Stable extensive liver metastatic disease.      REVIEW OF SYSTEMS:   Constitutional: Denies fevers and chills Eyes: Denies blurriness of vision Ears, nose, mouth, throat, and face: Denies mucositis or sore throat Respiratory: Denies cough, dyspnea or wheezes Cardiovascular: Denies palpitation, chest discomfort Gastrointestinal: Reports sharp, cramping abdominal pain with nausea and vomiting. Denies diarrhea but reports constipation. Skin: Denies abnormal skin rashes Lymphatics: Denies new lymphadenopathy or easy bruising Neurological:Denies numbness, tingling or new weaknesses Behavioral/Psych: Mood is stable, no new changes  Extremities: No lower extremity edema All other systems were reviewed with the patient and are negative.  I have reviewed the past medical history, past surgical history, social history and family history with the patient and they are unchanged from previous note.   PHYSICAL EXAMINATION: ECOG PERFORMANCE STATUS: 3 - Symptomatic, >50% confined to bed  Vitals:   02/20/20 0651 02/20/20 1402  BP: 128/86 103/73  Pulse: (!) 110 (!) 105  Resp: 20 20  Temp: 98.7 F (37.1 C) 98.8 F (37.1 C)  SpO2: 100% 100%   Filed Weights   02/17/20 2345  Weight: 175 lb 0.7 oz (79.4 kg)    Intake/Output from previous day: 01/25 0701 - 01/26 0700 In: 3298.7 [P.O.:720; I.V.:2378.7; IV Piggyback:200] Out: 425 [Urine:425]  GENERAL:alert, no distress and comfortable SKIN: skin color, texture, turgor are normal, no rashes or significant lesions EYES: normal, Conjunctiva are pink and non-injected, sclera clear Musculoskeletal:no cyanosis of digits and no clubbing  NEURO: alert & oriented x 3 with fluent speech, no focal  motor/sensory deficits  LABORATORY DATA:  I have reviewed the data as listed CMP Latest Ref Rng & Units 02/20/2020 02/19/2020 02/18/2020  Glucose 70 - 99 mg/dL 78 86 71  BUN 6 - 20 mg/dL '8 8 18  ' Creatinine 0.44 - 1.00 mg/dL 0.48 0.50 0.57  Sodium 135 - 145 mmol/L 133(L) 133(L) 133(L)  Potassium 3.5 - 5.1 mmol/L 3.1(L) 3.5 4.0  Chloride 98 - 111 mmol/L 101 102 103  CO2 22 - 32 mmol/L '22 23 22  ' Calcium 8.9 - 10.3 mg/dL 8.3(L) 8.5(L) 8.3(L)  Total Protein 6.5 - 8.1 g/dL 6.7 6.9 6.5  Total Bilirubin 0.3 - 1.2 mg/dL 6.3(H) 5.8(H) 4.7(H)  Alkaline Phos 38 - 126 U/L 336(H) 311(H) 214(H)  AST 15 - 41 U/L 147(H) 157(H) 148(H)  ALT 0 - 44 U/L 76(H) 81(H) 71(H)    Lab Results  Component Value Date   WBC 6.8 02/20/2020   HGB 8.0 (L) 02/20/2020   HCT 24.7 (L) 02/20/2020   MCV 97.2 02/20/2020   PLT 151 02/20/2020   NEUTROABS 4.6 02/20/2020    CT ANGIO CHEST PE W OR WO CONTRAST  Result Date: 02/01/2020 CLINICAL DATA:  Increasing right rib pain for 1 day. Nausea and vomiting EXAM: CT ANGIOGRAPHY CHEST WITH CONTRAST TECHNIQUE: Multidetector CT imaging of the chest was performed using the standard protocol during bolus administration of intravenous contrast. Multiplanar CT image reconstructions and MIPs were obtained to evaluate the vascular anatomy. CONTRAST:  183m OMNIPAQUE IOHEXOL 350 MG/ML SOLN COMPARISON:  01/11/2020 FINDINGS: Cardiovascular: Normal heart size. No pericardial effusion. No acute aortic  finding. Extensive motion and streak artifact. Pulmonary emboli are again seen at the bifurcation of the right main pulmonary artery, peripheral within the vessel and nonacute. More peripheral pulmonary arteries are not reliably characterized due to the degree of artifact. No definite progression from 01/11/2020. Mediastinum/Nodes: Mild enlargement of bilateral axillary lymph nodes which may be related to history of connective tissue disease. No mediastinal adenopathy. Lungs/Pleura: Reportedly patient  is COVID positive per the chart. There is no edema, consolidation, effusion, or pneumothorax. Small opacity at the lingula which has a scar-like appearance, also seen on prior. No suspicious pulmonary nodules. Upper Abdomen: Reported separately Musculoskeletal: No acute finding. Review of the MIP images confirms the above findings. IMPRESSION: 1. Very limited CTA due to the extent of motion and streak artifact. Persisting nonacute right-sided pulmonary emboli as seen on 01/11/2020 chest CTA. No clear progression of emboli. DVT is present at the left common femoral vein on prior abdominal CT. 2. COVID positivity without pneumonia. Electronically Signed   By: Monte Fantasia M.D.   On: 02/01/2020 04:53   CT ABDOMEN PELVIS W CONTRAST  Result Date: 02/17/2020 CLINICAL DATA:  Abdominal pain, metastatic pancreatic cancer, COVID positive EXAM: CT ABDOMEN AND PELVIS WITH CONTRAST TECHNIQUE: Multidetector CT imaging of the abdomen and pelvis was performed using the standard protocol following bolus administration of intravenous contrast. CONTRAST:  171m OMNIPAQUE IOHEXOL 300 MG/ML  SOLN COMPARISON:  02/06/2020 FINDINGS: Lower chest: Scarring/atelectasis in the lingula and left lower lobe. Hepatobiliary: Innumerable hepatic metastases, similar to recent CT. Gallbladder is unremarkable, noting mild gallbladder wall edema, similar. No intrahepatic or extrahepatic ductal dilatation. Pancreas: 3.9 x 2.9 cm mass in the pancreatic head (series 2/image 46), similar, corresponding to the patient's known pancreatic adenocarcinoma. Atrophy of the pancreatic body/tail with ductal dilatation. Spleen: Within normal limits. Adrenals/Urinary Tract: Adrenal glands are within normal limits. Segmental/wedge-shaped hypoperfusion involving the posterolateral right lower kidney (series 2/images 51-52). Associated differential enhancement involving the posterior left lower kidney (series 7/image 43). These findings are nonspecific but are  most suggestive of pyelonephritis. No hydronephrosis. Bladder is within normal limits. Stomach/Bowel: Stomach is within normal limits. No evidence of bowel obstruction. Appendix is not discretely visualized. No colonic wall thickening or inflammatory changes. Vascular/Lymphatic: No evidence of abdominal aortic aneurysm. Stable DVT in the left common femoral vein (series 2/image 93). 14 mm short axis portacaval node (series 2/image 41), unchanged. No suspicious pelvic lymphadenopathy. Reproductive: Status post hysterectomy. No adnexal masses. Other: Small to moderate abdominopelvic ascites. Associated mild pelvic peritoneal thickening. Musculoskeletal: Mild degenerative changes at L5-S1. IMPRESSION: Mildly heterogeneous perfusion involving the bilateral kidneys, nonspecific but most suggestive of pyelonephritis. Otherwise, no interval change from recent CT. Pancreatic adenocarcinoma. Innumerable hepatic metastases. Small upper abdominal nodal metastasis. Small to moderate abdominopelvic ascites with mild peritoneal thickening, likely malignant. Stable DVT in the left common femoral vein. Electronically Signed   By: SJulian HyM.D.   On: 02/17/2020 16:18   CT ABDOMEN PELVIS W CONTRAST  Result Date: 02/06/2020 CLINICAL DATA:  Abdominal pain and fever. Metastatic pancreatic adenocarcinoma. EXAM: CT ABDOMEN AND PELVIS WITH CONTRAST TECHNIQUE: Multidetector CT imaging of the abdomen and pelvis was performed using the standard protocol following bolus administration of intravenous contrast. CONTRAST:  1093mOMNIPAQUE IOHEXOL 300 MG/ML  SOLN COMPARISON:  CT 5 days ago 02/01/2020 FINDINGS: Lower chest: Stable lingular opacity. Slight increase in bibasilar atelectasis. No pleural fluid. Hepatobiliary: Innumerable hepatic metastasis. No significant change in the short interim. Layering sludge in the gallbladder. Mild gallbladder wall thickening.  There is no biliary obstruction or discrete stone. Pancreas: Pancreatic  head mass measures 3.4 cm prom distal pancreatic ductal dilatation with pancreatic atrophy unchanged. No adjacent inflammatory change. Spleen: Normal in size without focal abnormality. Adrenals/Urinary Tract: No adrenal nodule. The previous low-density band in the lower left kidney has improved and near completely resolved. There is homogeneous renal enhancement with symmetric excretion on delayed phase imaging. Unremarkable urinary bladder. Stomach/Bowel: Nondistended stomach. Pancreatic mass abuts the distal gastric body without gastric outlet obstruction. Small amount of enteric contrast is seen involving distal small bowel and throughout the colon. No obstruction. Suggestion of small bowel thickening and enhancement involving short segment in the left lower quadrant, series 2, image 71. There is improved submucosal low-density in the small bowel from prior. Submucosal fatty deposition of the ascending colon is again seen. Slight increase in submucosal fatty deposition involving the transverse and descending colon. Vascular/Lymphatic: Left femoral DVT again seen. No progression from prior. No portal vein thrombosis. Normal caliber abdominal aorta. No acute vascular findings. Enlarged periportal node, largest measuring 14 mm, unchanged. No progressive adenopathy in the short interim. Reproductive: Hysterectomy.  Ovaries not delineated. Other: Small volume abdominopelvic ascites, which has slightly progressed from prior exam. Peritoneal thickening in the right pericolic gutter is again seen. Mild generalized omental and mesenteric edema. Patchy subcutaneous densities in the anterior abdominal wall with foci of air typical of medication injection sites. Musculoskeletal: No focal bone lesion or acute osseous abnormality. IMPRESSION: 1. Short segment loop of small bowel with wall thickening and mild enhancement in the left lower quadrant may represent focal enteritis. 2. Pancreatic head mass with innumerable hepatic  metastasis. Peritoneal thickening in the right pericolic gutter is again seen. No significant change from CT 5 days ago. 3. Small volume abdominopelvic ascites, slightly progressed from prior exam. 4. Improved bandlike hypodensity in the lower left kidney, near completely resolved. 5. Gallbladder sludge. Gallbladder wall thickening or small amount of pericholecystic fluid, likely reactive. 6. Left femoral DVT again seen. Electronically Signed   By: Keith Rake M.D.   On: 02/06/2020 19:48   CT ABDOMEN PELVIS W CONTRAST  Result Date: 02/01/2020 CLINICAL DATA:  Acute right flank pain.  Pancreatic cancer EXAM: CT ABDOMEN AND PELVIS WITH CONTRAST TECHNIQUE: Multidetector CT imaging of the abdomen and pelvis was performed using the standard protocol following bolus administration of intravenous contrast. CONTRAST:  149m OMNIPAQUE IOHEXOL 350 MG/ML SOLN COMPARISON:  01/24/2020 FINDINGS: Lower chest:  Reported separately Hepatobiliary: Innumerable metastases throughout the liver. Gallbladder sludge or calculi is likely. No evidence of acute cholecystitis. Pancreas: Mass at the pancreatic head with marked proximal ductal dilatation and atrophy. The mass measures approximately 3 cm. No acute superimposed inflammation. Spleen: Unremarkable. Adrenals/Urinary Tract: Negative adrenals. There is a band of low-density in the posterior and lower left kidney. No hydronephrosis. Unremarkable bladder. Stomach/Bowel: Extensive submucosal low-density within small bowel proximal colon which appears fairly well-defined and has the appearance of fat deposition, also seen on prior but more prominent today. No bowel obstruction or clear bowel edema. Gastric outlet is distorted by the pancreatic mass, but no gastric dilatation. The appendix is not well visualized. No pericecal inflammation. Vascular/Lymphatic: DVT in the left common femoral vein which is resolved by the level of the common femoral bifurcation. This finding will be  called in conjunction with chest CT findings. Prominent lymph nodes in the deep liver drainage about the pancreatic head, similar and likely metastatic. Reproductive:Hysterectomy. Other: Small volume ascites in the pelvis. Minimal interloop fluid  is also seen. No discrete peritoneal nodularity but there is likely mild thickening of the peritoneum the upper right pericolic gutter. Musculoskeletal: No acute abnormalities. IMPRESSION: 1. DVT in the left common femoral vein, nonobstructive. 2. Band of hypoenhancement in the lower left kidney, contralateral to site of symptoms. Pyelonephritis is considered, please correlate with urinalysis. 3. Known pancreas cancer with extensive liver metastases. 4. Small volume ascites with possible peritoneal thickening, concerning for early peritoneal metastatic disease. 5. Gallbladder sludge or calculi. No evidence of acute cholecystitis. Electronically Signed   By: Monte Fantasia M.D.   On: 02/01/2020 04:47   CT Abdomen Pelvis W Contrast  Result Date: 01/24/2020 CLINICAL DATA:  Abdominal pain for the past 2 days. Constipation. Ongoing chemotherapy for pancreatic cancer. EXAM: CT ABDOMEN AND PELVIS WITH CONTRAST TECHNIQUE: Multidetector CT imaging of the abdomen and pelvis was performed using the standard protocol following bolus administration of intravenous contrast. CONTRAST:  163m OMNIPAQUE IOHEXOL 300 MG/ML  SOLN COMPARISON:  Abdomen MR dated 01/16/2020. Chest CTA dated 01/11/2020. Abdomen and pelvis CT dated 11/24/2019. FINDINGS: Lower chest: Mildly progressive linear and mass-like atelectasis at the left lung base with no significant change in linear atelectasis or scarring at the right lung base. Hepatobiliary: Large number of masses throughout the liver without significant change since 01/16/2020. Unremarkable gallbladder. Pancreas: No significant change in the mass in the pancreas at the level of the junction of the pancreatic head and body since 01/16/2020 with  associated marked dilatation of the pancreatic duct. Spleen: Unremarkable. Adrenals/Urinary Tract: Adrenal glands are unremarkable. Kidneys are normal, without renal calculi, focal lesion, or hydronephrosis. Bladder is unremarkable. Stomach/Bowel: Minimal diffuse enlargement of the appendix with mild diffuse low density wall thickening without periappendiceal soft tissue stranding. The appendix measures 9 mm in maximum diameter, similar to 11/24/2019. Unremarkable stomach, small bowel and colon. No significant stool in the colon. Vascular/Lymphatic: No significant vascular findings are present. No enlarged abdominal or pelvic lymph nodes. Reproductive: Status post hysterectomy. No adnexal masses. Other: No abdominal wall hernia or abnormality. No abdominopelvic ascites. Musculoskeletal: L5-S1 degenerative changes. IMPRESSION: 1. No acute abnormality. 2. No significant change in the known pancreatic malignancy with associated marked dilatation of the pancreatic duct. 3. Stable extensive liver metastatic disease. Electronically Signed   By: SClaudie ReveringM.D.   On: 01/24/2020 10:57   DG Chest Port 1 View  Result Date: 02/17/2020 CLINICAL DATA:  Weakness and dehydration. Recent history of COVID-19. EXAM: PORTABLE CHEST 1 VIEW COMPARISON:  Chest radiograph 02/01/2020. FINDINGS: Port-A-Cath tip projects over the superior vena cava. Monitoring leads overlie the patient. Stable cardiac and mediastinal contours. Low lung volumes with bibasilar opacities favored represent atelectasis. No pleural effusion or pneumothorax. IMPRESSION: Low lung volumes with bibasilar atelectasis. Electronically Signed   By: DLovey NewcomerM.D.   On: 02/17/2020 13:29   DG Chest Port 1 View  Result Date: 02/01/2020 CLINICAL DATA:  Sepsis, right anterior chest pain, history of pancreatic cancer EXAM: PORTABLE CHEST 1 VIEW COMPARISON:  11/24/2019 FINDINGS: Single frontal view of the chest demonstrates stable left chest wall port. Cardiac  silhouette is unremarkable. Lung volumes are diminished, with no acute airspace disease, effusion, or pneumothorax. No acute bony abnormalities. IMPRESSION: 1. Low lung volumes.  No acute process. Electronically Signed   By: MRanda NgoM.D.   On: 02/01/2020 01:32    ASSESSMENT AND PLAN: 1. Metastatic pancreatic cancer 2. Severe sepsis secondary to pyelonephritis 3. Abdominal pain, nausea and vomiting 4. Left iliac DVT, left peroneal and  gastrocnemius vein DVT 5. Anemia 6. Thrombocytopenia 7. Transaminitis and hyperbilirubinemia secondary to #1, worsening  8. Protein calorie malnutrition 9. Goals of care discussion   -Dr. Domingo Cocking and I had family meriting with patient and her husband.  I reviewed diagnosis, treatment course.  Given her poor tolerance to chemotherapy, active disease progression, especially worsening liver failure, poor PS, she is not a candidate for more chemo treatment.  I recommend we focus on symptom management and quality of life. -We encourage patient to consider hospice care at home after discharge. We reviewed the logistics and what to anticipate from hospice service -Despite multiple similar discussions with patient and her husband in the past, they still seem quite shocked and overwhelmed when we discussed the above, and they would like to have more time to think about it -they also asked if they can get a second opinion from other cancer center for her cancer treatment. The best we can help is to set up a virtual visit for her after she gets home, before she enrolled to home hospice. She will think about it -I appreciate Dr. Kirstie Mirza assistance on her symptom management and [place] discussion today  -I will f/u on Friday    LOS: 3 days   Truitt Merle, MD 02/20/20

## 2020-02-20 NOTE — Progress Notes (Signed)
PROGRESS NOTE    Cynthia Frye  [id number] DOB: 1960/04/06 DOA: 02/17/2020 PCP: Josetta Huddle, MD   Chief Complaint  Patient presents with  . Weakness  . Dehydration   Brief Narrative:  [name] a59 y.o.female,with medical history of pancreatic cancer s/p chemotherapy, lupus, asthma, recent COVID-19 infection,which was treated with remdesivir in the hospital for 3 days during an admission for sepsis of unclear origin who has continued to have abdominal pain, nausea, and failure to thrive at home prompting return to the ED 1/23. She was tachycardic, tachypneic with CT abd/pelvis demonstrating known widespread cancer and mild heterogenous contrast uptake in kidneys bilaterally. She was given broad spectrum antibiotics and sepsis-dose IV fluids and admitted to SDU.  Assessment & Plan:   Active Problems:   Severe sepsis ([place])  Goals of care discussion: The patient has advanced cancer which has progressed despite treatment (which she has tolerated poorly), now no longer on treatment of any kind. Hospice has been recommended.  - goals of care meeting this AM with Dr. Burr Medico and Dr. Domingo Cocking, appreciate their assistance -> awaiting updated notes from this conversation - IV zofran prn; IV compazine prn; IV fentanyl prn > will need easily absorbable options at home.  - IV ativan added as well.   Severe sepsis due to pyelonephritis: Diagnosed at admission based on tachycardia, tachypnea, abnormalities of kidneys on CT abdomen.  - Febrile 1/24. Urine culture growing Enterococcus faecalis > susceptibility to ampicillin  - Follow blood cultures, repeat cultures now since febrile.  Recent covid-19 infection: +SARS-CoV-2 testing 1/7. s/p Pfizer July, Aug 2021. Treated with some remdesivir, never hypoxic to require steroids. Has completed 10 days of isolation.  - DC isolation.  Acute left CFV DVT, chronic right-sided pulmonary emboli: Left iliac vein DVT (10/2019), DVT in the left  peroneal and gastrocnemius veins (01/07/2020), DVT in the left common femoral vein (02/01/2020) - Lovenox 1mg /kg q12h  Hyponatremia:  - Continue LR, decrease rate.   Nausea, vomiting, intolerance to enteral feeding, GERD, severe protein calorie malnutrition: Prealbumin undetectable at recent hospitalization. - Maximize antiemetics, hold TPN for now.   Metastatic pancreatic cancer, elevated LFTs: Progressive despite chemotherapy which is also poorly tolerated. No longer on chemotherapy, [place] conversations as noted above.  - bilirubin rising, 6.3 today, continue to follow (no intrahepatic or extrahepatic ductal dilatation on 1/23 CT scan) - Dr. Burr Medico added to treatment team.  - Palliative care consulted  Pancytopenia, anemia of malignancy:  - fluctuating Hb, continue to monitor - transfuse for <7  Thrombocytopenia: resolved  SLE: No flare. Hold home meds for now  HTN:  - Restart metoprolol. Hold other home meds with softer BPs on admit. Restart when rising BP.  DVT prophylaxis: lovenox Code Status: full  Family Communication: husband at bedside Disposition:   Status is: Inpatient  Remains inpatient appropriate because:Inpatient level of care appropriate due to severity of illness   Dispo: The patient is from: Home              Anticipated d/c is to: Home              Anticipated d/c date is: > 3 days              Patient currently is not medically stable to d/c.   Difficult to place patient No       Consultants:   Palliative care  oncology  Procedures:  none  Antimicrobials:  Anti-infectives (From admission, onward)   Start  Dose/Rate Route Frequency Ordered Stop   02/19/20 1315  ampicillin (OMNIPEN) 2 g in sodium chloride 0.9 % 100 mL IVPB        2 g 300 mL/hr over 20 Minutes Intravenous Every 6 hours 02/19/20 1218     02/18/20 1400  cefTRIAXone (ROCEPHIN) 2 g in sodium chloride 0.9 % 100 mL IVPB  Status:  Discontinued        2 g 200 mL/hr over 30  Minutes Intravenous Every 24 hours 02/18/20 0644 02/19/20 1218   02/18/20 0200  vancomycin (VANCOCIN) IVPB 1000 mg/200 mL premix  Status:  Discontinued        1,000 mg 200 mL/hr over 60 Minutes Intravenous Every 12 hours 02/17/20 1847 02/18/20 0644   02/17/20 2100  ceFEPIme (MAXIPIME) 2 g in sodium chloride 0.9 % 100 mL IVPB  Status:  Discontinued        2 g 200 mL/hr over 30 Minutes Intravenous Every 8 hours 02/17/20 1841 02/18/20 0644   02/17/20 1400  vancomycin (VANCOREADY) IVPB 2000 mg/400 mL        2,000 mg 200 mL/hr over 120 Minutes Intravenous  Once 02/17/20 1308 02/17/20 2011   02/17/20 1300  ceFEPIme (MAXIPIME) 2 g in sodium chloride 0.9 % 100 mL IVPB        2 g 200 mL/hr over 30 Minutes Intravenous  Once 02/17/20 1251 02/17/20 1350   02/17/20 1300  metroNIDAZOLE (FLAGYL) IVPB 500 mg        500 mg 100 mL/hr over 60 Minutes Intravenous  Once 02/17/20 1251 02/17/20 1502      Subjective: Her and husband seem shocked with [place] discussion/family meeting from palliative and oncology  Objective: Vitals:   02/19/20 1224 02/19/20 2110 02/20/20 0651 02/20/20 1402  BP: 132/89 130/86 128/86 103/73  Pulse: 97 (!) 101 (!) 110 (!) 105  Resp: 14 20 20 20   Temp: 98.4 F (36.9 C) 99.3 F (37.4 C) 98.7 F (37.1 C) 98.8 F (37.1 C)  TempSrc: Oral Oral Oral Oral  SpO2: 100% 99% 100% 100%  Weight:      Height:        Intake/Output Summary (Last 24 hours) at 02/20/2020 1546 Last data filed at 02/20/2020 0534 Gross per 24 hour  Intake 3298.68 ml  Output 425 ml  Net 2873.68 ml   Filed Weights   02/17/20 2345  Weight: 79.4 kg    Examination:  General exam: Appears calm and comfortable  Respiratory system: unlabored Cardiovascular system: RRR Gastrointestinal system: Abdomen is nondistended, soft and nontender.  Central nervous system: Alert and oriented. No focal neurological deficits. Extremities: no LEE Skin: No rashes, lesions or ulcers Psychiatry: Judgement and insight  appear normal. Mood & affect appropriate.   Data Reviewed: I have personally reviewed following labs and imaging studies  CBC: Recent Labs  Lab 02/17/20 1236 02/18/20 0300 02/19/20 1438 02/20/20 0500  WBC 9.3 6.1 6.8 6.8  NEUTROABS 5.9  --  4.9 4.6  HGB 9.4* 7.6* 8.7* 8.0*  HCT 30.4* 24.0* 27.1* 24.7*  MCV 101.3* 100.8* 99.3 97.2  PLT 189 133* 157 426    Basic Metabolic Panel: Recent Labs  Lab 02/17/20 1236 02/18/20 0300 02/19/20 1438 02/20/20 0500  NA 135 133* 133* 133*  K 4.3 4.0 3.5 3.1*  CL 101 103 102 101  CO2 20* 22 23 22   GLUCOSE 134* 71 86 78  BUN 21* 18 8 8   CREATININE 0.75 0.57 0.50 0.48  CALCIUM 8.4* 8.3* 8.5* 8.3*  GFR: Estimated Creatinine Clearance: 83.1 mL/min (by C-G formula based on SCr of 0.48 mg/dL).  Liver Function Tests: Recent Labs  Lab 02/17/20 1236 02/18/20 0300 02/19/20 1438 02/20/20 0500  AST 150* 148* 157* 147*  ALT 78* 71* 81* 76*  ALKPHOS 241* 214* 311* 336*  BILITOT 4.9* 4.7* 5.8* 6.3*  PROT 7.8 6.5 6.9 6.7  ALBUMIN 2.1* 1.7* 1.8* 1.7*    CBG: Recent Labs  Lab 02/18/20 0540  GLUCAP 71     Recent Results (from the past 240 hour(s))  Blood Culture (routine x 2)     Status: None (Preliminary result)   Collection Time: 02/17/20  1:00 PM   Specimen: BLOOD  Result Value Ref Range Status   Specimen Description   Final    BLOOD SITE NOT SPECIFIED Performed at [facility], [address], [place], [place] [address]    Special Requests   Final    BOTTLES DRAWN AEROBIC AND ANAEROBIC Blood Culture adequate volume Performed at [facility] [address] [place], [place] [address]    Culture   Final    NO GROWTH 3 DAYS Performed at [facility], [place] [address] [place], [place] [address]    Report Status PENDING  Incomplete  Urine culture     Status: Abnormal   Collection Time: 02/17/20  8:20 PM   Specimen: In/Out Cath Urine  Result Value Ref Range Status   Specimen Description    Final    IN/OUT CATH URINE Performed at [facility] [address] [place], [place] [address]    Special Requests   Final    NONE Performed at [facility], [place] [address], [place], [place] [address]    Culture 40,000 COLONIES/mL ENTEROCOCCUS FAECALIS (Dalayla [place])  Final   Report Status 02/20/2020 FINAL  Final   Organism ID, Bacteria ENTEROCOCCUS FAECALIS (Sherill [place])  Final      Susceptibility   Enterococcus faecalis - MIC*    AMPICILLIN <=2 SENSITIVE Sensitive     NITROFURANTOIN <=16 SENSITIVE Sensitive     VANCOMYCIN 1 SENSITIVE Sensitive     * 40,000 COLONIES/mL ENTEROCOCCUS FAECALIS  MRSA PCR Screening     Status: None   Collection Time: 02/17/20 11:18 PM   Specimen: Nasal Mucosa; Nasopharyngeal  Result Value Ref Range Status   MRSA by PCR NEGATIVE NEGATIVE Final    Comment:        The GeneXpert MRSA Assay (FDA approved for NASAL specimens only), is one component of Brennyn Ortlieb comprehensive MRSA colonization surveillance program. It is not intended to diagnose MRSA infection nor to guide or monitor treatment for MRSA infections. Performed at [facility], [place] [address] [place], [place] [address]   Culture, blood (routine x 2)     Status: None (Preliminary result)   Collection Time: 02/19/20  1:27 PM   Specimen: BLOOD  Result Value Ref Range Status   Specimen Description   Final    BLOOD RIGHT ANTECUBITAL Performed at [facility] [address], [place], [place] [address]    Special Requests   Final    BOTTLES DRAWN AEROBIC ONLY Blood Culture results may not be optimal due to an inadequate volume of blood received in culture bottles Performed at [facility] [address] [place], [place] [address]    Culture   Final    NO GROWTH < 24 HOURS Performed at [facility] [address]  [place], [place] [address]    Report Status PENDING  Incomplete  Culture, blood (routine x 2)      Status: None (Preliminary result)   Collection Time: 02/19/20  1:34 PM   Specimen: BLOOD RIGHT HAND  Result Value Ref Range Status   Specimen Description   Final    BLOOD RIGHT HAND Performed at [facility] [address] [place], [place] [address]    Special Requests   Final    BOTTLES DRAWN AEROBIC ONLY Blood Culture results may not be optimal due to an inadequate volume of blood received in culture bottles Performed at [facility] [address], [place], [place] [address]    Culture   Final    NO GROWTH < 24 HOURS Performed at [facility] [address] [place], [place] [address]    Report Status PENDING  Incomplete         Radiology Studies: No results found.      Scheduled Meds: . Chlorhexidine Gluconate Cloth  6 each Topical Daily  . dronabinol  2.5 mg Oral BID AC  . enoxaparin  80 mg Subcutaneous Q12H  . feeding supplement  237 mL Oral BID BM  . mouth rinse  15 mL Mouth Rinse BID  . metoprolol succinate  50 mg Oral Daily  . sucralfate  1 g Oral TID WC & HS   Continuous Infusions: . ampicillin (OMNIPEN) IV 2 g (02/20/20 1037)  . lactated ringers 100 mL/hr at 02/19/20 1920     LOS: 3 days    Time spent: over 42 min    Fayrene Helper, MD [facility]   To contact the attending provider between 7A-7P or the covering provider during after hours 7P-7A, please log into the web site [email or web address] and access using universal [place] password for that web site. If you do not have the password, please call the hospital operator.  02/20/2020, 3:46 PM

## 2020-02-20 NOTE — Accreditation Note (Signed)
Pt resting at present time husband at bedside. SRP, RN

## 2020-02-20 NOTE — Progress Notes (Signed)
Pt has increase pain in her abdomen, rating pain greater than 10 and crying. Pt seem to receive the best relief with Fentanyl and Ativan. She become sleepy, however, arouses easily. MD( Hospitalist) updated earlier. Husband at bedside and bed alarm activated. Will follow up with pt. SRP,RN

## 2020-02-20 NOTE — Plan of Care (Signed)
  Problem: Education: Goal: Knowledge of General Education information will improve Description Including pain rating scale, medication(s)/side effects and non-pharmacologic comfort measures Outcome: Progressing   Problem: Health Behavior/Discharge Planning: Goal: Ability to manage health-related needs will improve Outcome: Progressing   

## 2020-02-20 NOTE — Progress Notes (Signed)
Daily Progress Note   Patient Name: Cynthia Frye       Date: 02/20/2020 DOB: 1960/04/15  Age: 60 y.o. MRN#: [id number] Attending Physician: Elodia Florence., * Primary Care Physician: Josetta Huddle, MD Admit Date: 02/17/2020  Reason for Consultation/Follow-up: Establishing goals of care  Subjective: Dr. Burr Medico and I met today with patient and her husband, Cynthia Frye, to discuss care plan and goals moving forward.    Length of Stay: 3  Current Medications: Scheduled Meds:  . Chlorhexidine Gluconate Cloth  6 each Topical Daily  . dronabinol  2.5 mg Oral BID AC  . enoxaparin  80 mg Subcutaneous Q12H  . feeding supplement  237 mL Oral BID BM  . mouth rinse  15 mL Mouth Rinse BID  . metoprolol succinate  50 mg Oral Daily  . sucralfate  1 g Oral TID WC & HS    Continuous Infusions: . ampicillin (OMNIPEN) IV 2 g (02/20/20 1711)  . lactated ringers 100 mL/hr at 02/19/20 1920    PRN Meds: HYDROmorphone (DILAUDID) injection, LORazepam, ondansetron (ZOFRAN) IV, prochlorperazine, topiramate  Physical Exam        General: Alert, awake, in no acute distress. Chronically ill appearing HEENT: No bruits, no goiter, no JVD Heart: Regular rate and rhythm. No murmur appreciated. Lungs: Good air movement, clear Abdomen: Soft, nontender, nondistended, positive bowel sounds.  Ext: No significant edema Skin: Warm and dry  Vital Signs: BP 103/73 (BP Location: Left Arm)   Pulse (!) 105   Temp 98.8 F (37.1 C) (Oral)   Resp 20   Ht '5\' 7"'  (1.702 m)   Wt 79.4 kg   SpO2 100%   BMI 27.42 kg/m  SpO2: SpO2: 100 % O2 Device: O2 Device: Room Air O2 Flow Rate:    Intake/output summary:   Intake/Output Summary (Last 24 hours) at 02/20/2020 2143 Last data filed at 02/20/2020 0534 Gross per 24  hour  Intake 920 ml  Output 425 ml  Net 495 ml   LBM: Last BM Date:  (pt unsure) Baseline Weight: Weight: 79.4 kg Most recent weight: Weight: 79.4 kg       Palliative Assessment/Data:    Flowsheet Rows   Flowsheet Row Most Recent Value  Intake Tab   Referral Department Hospitalist  Unit at Time of Referral Med/Surg Unit  Palliative Care Primary Diagnosis Cancer  Date Notified 02/18/20  Palliative Care Type Return patient Palliative Care  Reason for referral Non-pain Symptom, Clarify Goals of Care  Date of Admission 02/17/20  Date first seen by Palliative Care 02/19/20  # of days Palliative referral response time 1 Day(s)  # of days IP prior to Palliative referral 1  Clinical Assessment   Palliative Performance Scale Score 30%  Psychosocial & Spiritual Assessment   Palliative Care Outcomes   Patient/Family meeting held? Yes  Who was at the meeting? patient, husband      Patient Active Problem List   Diagnosis Date Noted  . Pancreatic cancer metastasized to liver ([place])   . Pyelonephritis   . Severe sepsis ([place]) 02/17/2020  . COVID-19 virus infection 02/01/2020  . Sepsis ([place]) 02/01/2020  . Biliary obstruction 01/15/2020  . Abdominal pain 01/04/2020  . Dysphagia 12/10/2019  . Malnutrition of moderate degree 11/26/2019  . 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 Constipation 11/25/2019  . Generalized abdominal pain   . Nausea and vomiting in adult 11/24/2019  . Dehydration 11/24/2019  . Prolonged QT interval 11/24/2019  . Emesis   . Tachycardia   . Genetic testing 11/12/2019  . Colitis 11/04/2019  . Acute deep vein thrombosis of left iliac vein ([facility]) 11/04/2019  . Hypokalemia 11/04/2019  . Port-A-Cath in place 10/24/2019  . Goals of care, counseling/discussion 10/18/2019  . Pancreatic cancer ([place]) 10/02/2019  . Chronic migraine without aura without status migrainosus, not intractable 04/23/2019  . Migraine with aura and without status migrainosus,  not intractable 04/23/2019  . Essential hypertension 04/29/2016  . Lupus (systemic lupus erythematosus) ([place]) 04/29/2016  . Asthma 04/29/2016  . Seasonal allergies 04/29/2016  . Abnormal stress test 04/09/2016    Palliative Care Assessment & Plan   Patient Profile: Cynthia Frye is a 60 year old female with metastatic pancreatic cancer, lupus, asthma, recent COVID-19 infection who presented to the hospital with continued abdominal pain, nausea, and failure to thrive.  CT demonstrates widespread cancer with heterogenous contrast uptake in the kidneys.  She is currently being treated with antibiotics for severe sepsis due to pyelonephritis.  Palliative consulted for goals of care.  Recommendations/Plan: Full code/full scope Reports pain remains uncontrolled.  Currently ordered fentanyl and she reports it does relieve pain, but does not last until next dose is due.  Will rotate to dilaudid 0.62m every 3 hours as needed. Discussed goals of care moving forward.  Reviewed continued decline in her cognition, nutrition, and functional status.  Discussed poor tolerance for chemotherapy and worsening liver failure and the fact that further disease modifying therapy is not going to add time or quality to her life.  Recommendation made for consideration for maintaining best quality of life possible through aggressive symptom management and support of hospice care moving forward. Both patient and her husband seem overwhelmed with discussion today.  They are open to further discussion about hospice services.  I have called and discussed with ABarrister's clerk  I am planning to follow-up with family tomorrow at 3:30 PM in conjunction with hospice liaison for another family meeting. Discussed heroic interventions in the event of cardiac or respiratory arrest and how these are not likely to result in goals of adding time and quality to life nor result in her being well enough to get home.  She will  consider. Her husband is interested in obtaining a second opinion.  Dr FBurr Medicorecommended virtual visit prior to enrollment in hospice if this is something  that she wishes to pursue. Plan for followup meeting tomorrow at 330 tomorrow.  Goals of Care and Additional Recommendations: Limitations on Scope of Treatment: Full Scope Treatment  Code Status:    Code Status Orders  (From admission, onward)         Start     Ordered   02/17/20 2319  Full code  Continuous        02/17/20 2318        Code Status History    Date Active Date Inactive Code Status Order ID Comments User Context   02/01/2020 0919 02/07/2020 2350 Full Code [id number]  Jonnie Finner, DO ED   01/15/2020 2037 01/18/2020 1841 Full Code [id number]  Elwyn Reach, MD ED   11/24/2019 1908 12/12/2019 1944 Full Code [id number]  Eugenie Filler, MD Inpatient   11/04/2019 2125 11/09/2019 2207 Full Code [id number]  Rise Patience, MD ED   04/09/2016 1319 04/09/2016 1952 Full Code [id number]  End, Harrell Gave, MD Inpatient   Advance Care Planning Activity      Prognosis:  < 6 months  Discharge Planning: To Be Determined- Recommend home with hospice  Care plan was discussed with patient, husband, Dr. Burr Medico, RN, Dr. Florene Glen  Thank you for allowing the Palliative Medicine Team to assist in the care of this patient.   Time In: 1200 Time Out: 1300 Total Time 60 Prolonged Time Billed No      Greater than 50%  of this time was spent counseling and coordinating care related to the above assessment and plan.  Micheline Rough, MD  Please contact Palliative Medicine Team phone at [phone] for questions and concerns.

## 2020-02-21 DIAGNOSIS — Z7189 Other specified counseling: Secondary | ICD-10-CM | POA: Diagnosis not present

## 2020-02-21 DIAGNOSIS — N12 Tubulo-interstitial nephritis, not specified as acute or chronic: Secondary | ICD-10-CM | POA: Diagnosis not present

## 2020-02-21 DIAGNOSIS — A419 Sepsis, unspecified organism: Secondary | ICD-10-CM | POA: Diagnosis not present

## 2020-02-21 DIAGNOSIS — C787 Secondary malignant neoplasm of liver and intrahepatic bile duct: Secondary | ICD-10-CM | POA: Diagnosis not present

## 2020-02-21 DIAGNOSIS — Z515 Encounter for palliative care: Secondary | ICD-10-CM | POA: Diagnosis not present

## 2020-02-21 DIAGNOSIS — C259 Malignant neoplasm of pancreas, unspecified: Secondary | ICD-10-CM | POA: Diagnosis not present

## 2020-02-21 LAB — CBC WITH DIFFERENTIAL/PLATELET
Abs Immature Granulocytes: 0.04 10*3/uL (ref 0.00–0.07)
Basophils Absolute: 0.1 10*3/uL (ref 0.0–0.1)
Basophils Relative: 1 %
Eosinophils Absolute: 0.1 10*3/uL (ref 0.0–0.5)
Eosinophils Relative: 2 %
HCT: 24.2 % — ABNORMAL LOW (ref 36.0–46.0)
Hemoglobin: 7.7 g/dL — ABNORMAL LOW (ref 12.0–15.0)
Immature Granulocytes: 1 %
Lymphocytes Relative: 20 %
Lymphs Abs: 1.5 10*3/uL (ref 0.7–4.0)
MCH: 31.6 pg (ref 26.0–34.0)
MCHC: 31.8 g/dL (ref 30.0–36.0)
MCV: 99.2 fL (ref 80.0–100.0)
Monocytes Absolute: 0.7 10*3/uL (ref 0.1–1.0)
Monocytes Relative: 9 %
Neutro Abs: 5 10*3/uL (ref 1.7–7.7)
Neutrophils Relative %: 67 %
Platelets: 157 10*3/uL (ref 150–400)
RBC: 2.44 MIL/uL — ABNORMAL LOW (ref 3.87–5.11)
RDW: 22.8 % — ABNORMAL HIGH (ref 11.5–15.5)
WBC: 7.4 10*3/uL (ref 4.0–10.5)
nRBC: 0.7 % — ABNORMAL HIGH (ref 0.0–0.2)

## 2020-02-21 LAB — COMPREHENSIVE METABOLIC PANEL
ALT: 73 U/L — ABNORMAL HIGH (ref 0–44)
AST: 142 U/L — ABNORMAL HIGH (ref 15–41)
Albumin: 1.6 g/dL — ABNORMAL LOW (ref 3.5–5.0)
Alkaline Phosphatase: 336 U/L — ABNORMAL HIGH (ref 38–126)
Anion gap: 8 (ref 5–15)
BUN: 8 mg/dL (ref 6–20)
CO2: 23 mmol/L (ref 22–32)
Calcium: 8.1 mg/dL — ABNORMAL LOW (ref 8.9–10.3)
Chloride: 103 mmol/L (ref 98–111)
Creatinine, Ser: 0.46 mg/dL (ref 0.44–1.00)
GFR, Estimated: >60 mL/min (ref >60)
Glucose, Bld: 76 mg/dL (ref 70–99)
Potassium: 3.4 mmol/L — ABNORMAL LOW (ref 3.5–5.1)
Sodium: 134 mmol/L — ABNORMAL LOW (ref 135–145)
Total Bilirubin: 6.8 mg/dL — ABNORMAL HIGH (ref 0.3–1.2)
Total Protein: 6.4 g/dL — ABNORMAL LOW (ref 6.5–8.1)

## 2020-02-21 LAB — MAGNESIUM: Magnesium: 1.5 mg/dL — ABNORMAL LOW (ref 1.7–2.4)

## 2020-02-21 LAB — PHOSPHORUS: Phosphorus: 2.4 mg/dL — ABNORMAL LOW (ref 2.5–4.6)

## 2020-02-21 MED ORDER — HYDROMORPHONE HCL 1 MG/ML IJ SOLN
0.3000 mg | INTRAMUSCULAR | Status: DC | PRN
  Administered 2020-02-21 – 2020-03-02 (×41): 0.5 mg via INTRAVENOUS
  Filled 2020-02-21 (×41): qty 0.5

## 2020-02-21 MED ORDER — MAGNESIUM SULFATE 2 GM/50ML IV SOLN
2.0000 g | Freq: Once | INTRAVENOUS | Status: AC
  Administered 2020-02-21: 2 g via INTRAVENOUS
  Filled 2020-02-21: qty 50

## 2020-02-21 NOTE — Progress Notes (Signed)
PROGRESS NOTE    Cynthia Frye  [id number] DOB: 24-Jun-1960 DOA: 02/17/2020 PCP: Josetta Huddle, MD   Chief Complaint  Patient presents with  . Weakness  . Dehydration   Brief Narrative:  [name] a59 y.o.female,with medical history of pancreatic cancer s/p chemotherapy, lupus, asthma, recent COVID-19 infection,which was treated with remdesivir in the hospital for 3 days during an admission for sepsis of unclear origin who has continued to have abdominal pain, nausea, and failure to thrive at home prompting return to the ED 1/23. She was tachycardic, tachypneic with CT abd/pelvis demonstrating known widespread cancer and mild heterogenous contrast uptake in kidneys bilaterally. She was given broad spectrum antibiotics and sepsis-dose IV fluids and admitted to SDU.  Assessment & Plan:   Active Problems:   Severe sepsis [facility])   Pancreatic cancer metastasized to liver [facility])   Pyelonephritis  Goals of care discussion: The patient has advanced cancer which has progressed despite treatment (which she has tolerated poorly), now no longer on treatment of any kind. Hospice has been recommended.  - goals of care meeting this AM with Dr. Burr Medico and Dr. Domingo Cocking, appreciate their assistance -> recommended focus on symptom management and quality of like - consider hospice acare at home after discharge - they asked for 2nd opinion, which Dr. Burr Medico going to look into - IV zofran prn; IV compazine prn; IV fentanyl prn > will need easily absorbable options at home.  - IV ativan added as well.   Severe sepsis due to pyelonephritis: Diagnosed at admission based on tachycardia, tachypnea, abnormalities of kidneys on CT abdomen.  - Febrile 1/24. Urine culture growing Enterococcus faecalis > susceptibility to ampicillin (1/25 - present) plan for 7-10 days - Follow blood cultures, repeat cultures now since febrile.  Recent covid-19 infection: +SARS-CoV-2 testing 1/7. s/p Pfizer July, Aug 2021.  Treated with some remdesivir, never hypoxic to require steroids. Has completed 10 days of isolation.  - DC isolation.  Acute left CFV DVT, chronic right-sided pulmonary emboli: Left iliac vein DVT (10/2019), DVT in the left peroneal and gastrocnemius veins (01/07/2020), DVT in the left common femoral vein (02/01/2020) - Lovenox 1mg /kg q12h  Hyponatremia:  - Continue LR   Nausea, vomiting, intolerance to enteral feeding, GERD, severe protein calorie malnutrition: Prealbumin undetectable at recent hospitalization. - Maximize antiemetics, hold TPN for now.   Metastatic pancreatic cancer, elevated LFTs: Progressive despite chemotherapy which is also poorly tolerated. No longer on chemotherapy, [place] conversations as noted above.  - bilirubin rising, 6.8 today, continue to follow (no intrahepatic or extrahepatic ductal dilatation on 1/23 CT scan) - discussed with Dr. Burr Medico - likely 2/2 metastatic pancreatic cancer - Palliative care consulted  Pancytopenia, anemia of malignancy:  - fluctuating Hb, continue to monitor - 7.7 today - transfuse for <7  Thrombocytopenia: resolved  SLE: No flare. Hold home meds for now  HTN:  - Restart metoprolol. Hold other home meds with softer BPs on admit. Restart when rising BP.  DVT prophylaxis: lovenox Code Status: full  Family Communication: husband at bedside Disposition:   Status is: Inpatient  Remains inpatient appropriate because:Inpatient level of care appropriate due to severity of illness   Dispo: The patient is from: Home              Anticipated d/c is to: Home              Anticipated d/c date is: > 3 days              Patient  currently is not medically stable to d/c.   Difficult to place patient No       Consultants:   Palliative care  oncology  Procedures:  none  Antimicrobials:  Anti-infectives (From admission, onward)   Start     Dose/Rate Route Frequency Ordered Stop   02/19/20 1315  ampicillin (OMNIPEN) 2 g  in sodium chloride 0.9 % 100 mL IVPB        2 g 300 mL/hr over 20 Minutes Intravenous Every 6 hours 02/19/20 1218     02/18/20 1400  cefTRIAXone (ROCEPHIN) 2 g in sodium chloride 0.9 % 100 mL IVPB  Status:  Discontinued        2 g 200 mL/hr over 30 Minutes Intravenous Every 24 hours 02/18/20 0644 02/19/20 1218   02/18/20 0200  vancomycin (VANCOCIN) IVPB 1000 mg/200 mL premix  Status:  Discontinued        1,000 mg 200 mL/hr over 60 Minutes Intravenous Every 12 hours 02/17/20 1847 02/18/20 0644   02/17/20 2100  ceFEPIme (MAXIPIME) 2 g in sodium chloride 0.9 % 100 mL IVPB  Status:  Discontinued        2 g 200 mL/hr over 30 Minutes Intravenous Every 8 hours 02/17/20 1841 02/18/20 0644   02/17/20 1400  vancomycin (VANCOREADY) IVPB 2000 mg/400 mL        2,000 mg 200 mL/hr over 120 Minutes Intravenous  Once 02/17/20 1308 02/17/20 2011   02/17/20 1300  ceFEPIme (MAXIPIME) 2 g in sodium chloride 0.9 % 100 mL IVPB        2 g 200 mL/hr over 30 Minutes Intravenous  Once 02/17/20 1251 02/17/20 1350   02/17/20 1300  metroNIDAZOLE (FLAGYL) IVPB 500 mg        500 mg 100 mL/hr over 60 Minutes Intravenous  Once 02/17/20 1251 02/17/20 1502      Subjective: Discussed with pt/husband They're still shocked and processing discussion from earlier, end of life discussions No complaints today from Mrs. Foskett, denies questions  Objective: Vitals:   02/20/20 1402 02/20/20 2156 02/21/20 0500 02/21/20 1306  BP: 103/73 124/79 119/70 110/79  Pulse: (!) 105 (!) 108 (!) 110 (!) 101  Resp: 20 20 20 20   Temp: 98.8 F (37.1 C) 98.2 F (36.8 C) 98.6 F (37 C) 98.3 F (36.8 C)  TempSrc: Oral Oral Oral Oral  SpO2: 100% 100% 97% 99%  Weight:      Height:       No intake or output data in the 24 hours ending 02/21/20 1815 Filed Weights   02/17/20 2345  Weight: 79.4 kg    Examination:  General: No acute distress. Lungs: unlabored Neurological: Alert and oriented 3. Moves all extremities 4 . Cranial  nerves II through XII grossly intact. Skin: Warm and dry. No rashes or lesions. Extremities: No clubbing or cyanosis. No edema. Psychiatric: Mood and affect are normal. Insight and judgment are appropriate.   Data Reviewed: I have personally reviewed following labs and imaging studies  CBC: Recent Labs  Lab 02/17/20 1236 02/18/20 0300 02/19/20 1438 02/20/20 0500 02/21/20 0453  WBC 9.3 6.1 6.8 6.8 7.4  NEUTROABS 5.9  --  4.9 4.6 5.0  HGB 9.4* 7.6* 8.7* 8.0* 7.7*  HCT 30.4* 24.0* 27.1* 24.7* 24.2*  MCV 101.3* 100.8* 99.3 97.2 99.2  PLT 189 133* 157 151 [id number]    Basic Metabolic Panel: Recent Labs  Lab 02/17/20 1236 02/18/20 0300 02/19/20 1438 02/20/20 0500 02/21/20 0453  NA 135 133* 133* 133* 134*  K 4.3 4.0 3.5 3.1* 3.4*  CL 101 103 102 101 103  CO2 20* 22 23 22 23   GLUCOSE 134* 71 86 78 76  BUN 21* 18 8 8 8   CREATININE 0.75 0.57 0.50 0.48 0.46  CALCIUM 8.4* 8.3* 8.5* 8.3* 8.1*  MG  --   --   --   --  1.5*  PHOS  --   --   --   --  2.4*    GFR: Estimated Creatinine Clearance: 83.1 mL/min (by C-G formula based on SCr of 0.46 mg/dL).  Liver Function Tests: Recent Labs  Lab 02/17/20 1236 02/18/20 0300 02/19/20 1438 02/20/20 0500 02/21/20 0453  AST 150* 148* 157* 147* 142*  ALT 78* 71* 81* 76* 73*  ALKPHOS 241* 214* 311* 336* 336*  BILITOT 4.9* 4.7* 5.8* 6.3* 6.8*  PROT 7.8 6.5 6.9 6.7 6.4*  ALBUMIN 2.1* 1.7* 1.8* 1.7* 1.6*    CBG: Recent Labs  Lab 02/18/20 0540  GLUCAP 71     Recent Results (from the past 240 hour(s))  Blood Culture (routine x 2)     Status: None (Preliminary result)   Collection Time: 02/17/20  1:00 PM   Specimen: BLOOD  Result Value Ref Range Status   Specimen Description   Final    BLOOD SITE NOT SPECIFIED Performed at [facility], [address], [place], [place] [address]    Special Requests   Final    BOTTLES DRAWN AEROBIC AND ANAEROBIC Blood Culture adequate volume Performed at [facility] [address] [place], [place] [address]    Culture   Final    NO GROWTH 4 DAYS Performed at [facility], [address] [place], [place] [address]    Report Status PENDING  Incomplete  Urine culture     Status: Abnormal   Collection Time: 02/17/20  8:20 PM   Specimen: In/Out Cath Urine  Result Value Ref Range Status   Specimen Description   Final    IN/OUT CATH URINE Performed at [facility] [address], [place], [place] [address]    Special Requests   Final    NONE Performed at [facility], [place] [address], [place], [place] [address]    Culture 40,000 COLONIES/mL ENTEROCOCCUS FAECALIS (Tishina Lown)  Final   Report Status 02/20/2020 FINAL  Final   Organism ID, Bacteria ENTEROCOCCUS FAECALIS (Aleni Andrus)  Final      Susceptibility   Enterococcus faecalis - MIC*    AMPICILLIN <=2 SENSITIVE Sensitive     NITROFURANTOIN <=16 SENSITIVE Sensitive     VANCOMYCIN 1 SENSITIVE Sensitive     * 40,000 COLONIES/mL ENTEROCOCCUS FAECALIS  MRSA PCR Screening     Status: None   Collection Time: 02/17/20 11:18 PM   Specimen: Nasal Mucosa; Nasopharyngeal  Result Value Ref Range Status   MRSA by PCR NEGATIVE NEGATIVE Final    Comment:        The GeneXpert MRSA Assay (FDA approved for NASAL specimens only), is one component of Sasha Rueth comprehensive MRSA colonization surveillance program. It is not intended to diagnose MRSA infection nor to guide or monitor treatment for MRSA infections. Performed at [facility], [place] [address], [place], [place] [address]   Culture, blood (routine x 2)     Status: None (Preliminary result)   Collection Time: 02/19/20  1:27 PM   Specimen: BLOOD  Result Value Ref Range Status   Specimen Description   Final  BLOOD RIGHT ANTECUBITAL Performed at [facility] [address] [place], [place] [address]    Special Requests   Final    BOTTLES DRAWN AEROBIC ONLY Blood Culture results may  not be optimal due to an inadequate volume of blood received in culture bottles Performed at [facility] [address], [place], [place] [address]    Culture   Final    NO GROWTH 2 DAYS Performed at [facility] [address], [place], [place] [address]    Report Status PENDING  Incomplete  Culture, blood (routine x 2)     Status: None (Preliminary result)   Collection Time: 02/19/20  1:34 PM   Specimen: BLOOD RIGHT HAND  Result Value Ref Range Status   Specimen Description   Final    BLOOD RIGHT HAND Performed at [facility] [address] [place], [place] [address]    Special Requests   Final    BOTTLES DRAWN AEROBIC ONLY Blood Culture results may not be optimal due to an inadequate volume of blood received in culture bottles Performed at [facility] [address], [place], [place] [address]    Culture   Final    NO GROWTH 2 DAYS Performed at [facility] [address] [place],  [address]    Report Status PENDING  Incomplete         Radiology Studies: No results found.      Scheduled Meds: . Chlorhexidine Gluconate Cloth  6 each Topical Daily  . dronabinol  2.5 mg Oral BID AC  . enoxaparin  80 mg Subcutaneous Q12H  . feeding supplement  237 mL Oral BID BM  . mouth rinse  15 mL Mouth Rinse BID  . metoprolol succinate  50 mg Oral Daily  . sucralfate  1 g Oral TID WC & HS   Continuous Infusions: . ampicillin (OMNIPEN) IV 2 g (02/21/20 1811)  . lactated ringers 100 mL/hr at 02/21/20 1127     LOS: 4 days    Time spent: over 30 min    Fayrene Helper, MD [facility]   To contact the attending provider between 7A-7P or the covering provider during after hours 7P-7A, please log into the web site [email or web address] and access using universal  password for that web site. If you do not have the password, please call the hospital operator.  02/21/2020, 6:15 PM

## 2020-02-21 NOTE — Progress Notes (Signed)
Daily Progress Note   Patient Name: NERA HAWORTH       Date: 02/21/2020 DOB: Jul 17, 1960  Age: 60 y.o. MRN#: [id number] Attending Physician: Elodia Florence., * Primary Care Physician: Josetta Huddle, MD Admit Date: 02/17/2020  Reason for Consultation/Follow-up: Establishing goals of care  Subjective: I met today with Ms. Din and her husband in conjunction with Chrislyn Edison Pace, liaison for [employer].  We again reviewed clinical course and options for care moving forward.  See below.  Length of Stay: 4  Current Medications: Scheduled Meds:  . Chlorhexidine Gluconate Cloth  6 each Topical Daily  . dronabinol  2.5 mg Oral BID AC  . enoxaparin  80 mg Subcutaneous Q12H  . feeding supplement  237 mL Oral BID BM  . mouth rinse  15 mL Mouth Rinse BID  . metoprolol succinate  50 mg Oral Daily  . sucralfate  1 g Oral TID WC & HS    Continuous Infusions: . ampicillin (OMNIPEN) IV 2 g (02/21/20 1811)  . lactated ringers 100 mL/hr at 02/21/20 1127    PRN Meds: HYDROmorphone (DILAUDID) injection, lip balm, LORazepam, ondansetron (ZOFRAN) IV, prochlorperazine, topiramate  Physical Exam        General: Alert, awake, in no acute distress. Chronically ill appearing HEENT: No bruits, no goiter, no JVD Heart: Regular rate and rhythm. No murmur appreciated. Lungs: Good air movement, clear Abdomen: Soft, nontender, nondistended, positive bowel sounds.  Ext: No significant edema Skin: Warm and dry  Vital Signs: BP 110/79 (BP Location: Right Arm)   Pulse (!) 101   Temp 98.3 F (36.8 C) (Oral)   Resp 20   Ht '5\' 7"'  (1.702 m)   Wt 79.4 kg   SpO2 99%   BMI 27.42 kg/m  SpO2: SpO2: 99 % O2 Device: O2 Device: Room Air O2 Flow Rate:    Intake/output summary:  No  intake or output data in the 24 hours ending 02/21/20 1822 LBM: Last BM Date: 02/20/20 Baseline Weight: Weight: 79.4 kg Most recent weight: Weight: 79.4 kg       Palliative Assessment/Data:    Flowsheet Rows   Flowsheet Row Most Recent Value  Intake Tab   Referral Department Hospitalist  Unit at Time of Referral Med/Surg Unit  Palliative Care Primary Diagnosis Cancer  Date Notified 02/18/20  Palliative Care Type Return patient Palliative Care  Reason for referral Non-pain Symptom, Clarify Goals of Care  Date of Admission 02/17/20  Date first seen by Palliative Care 02/19/20  # of days Palliative referral response time 1 Day(s)  # of days IP prior to Palliative referral 1  Clinical Assessment   Palliative Performance Scale Score 30%  Psychosocial & Spiritual Assessment   Palliative Care Outcomes   Patient/Family meeting held? Yes  Who was at the meeting? patient, husband      Patient Active Problem List   Diagnosis Date Noted  . Pancreatic cancer metastasized to liver ([place])   . Pyelonephritis   . Severe sepsis ([place]) 02/17/2020  . COVID-19 virus infection 02/01/2020  . Sepsis ([place]) 02/01/2020  . Biliary obstruction 01/15/2020  . Abdominal pain 01/04/2020  . Dysphagia 12/10/2019  . Malnutrition of moderate degree 11/26/2019  . 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 Constipation 11/25/2019  . Generalized abdominal pain   . Nausea and vomiting in adult 11/24/2019  . Dehydration 11/24/2019  . Prolonged QT interval 11/24/2019  . Emesis   . Tachycardia   . Genetic testing 11/12/2019  . Colitis 11/04/2019  . Acute deep vein thrombosis of left iliac vein ([facility]) 11/04/2019  . Hypokalemia 11/04/2019  . Port-A-Cath in place 10/24/2019  . Goals of care, counseling/discussion 10/18/2019  . Pancreatic cancer ([place]) 10/02/2019  . Chronic migraine without aura without status migrainosus, not intractable 04/23/2019  . Migraine with aura and without status migrainosus,  not intractable 04/23/2019  . Essential hypertension 04/29/2016  . Lupus (systemic lupus erythematosus) ([place]) 04/29/2016  . Asthma 04/29/2016  . Seasonal allergies 04/29/2016  . Abnormal stress test 04/09/2016    Palliative Care Assessment & Plan   Patient Profile: Ms. Fauth is a 60 year old female with metastatic pancreatic cancer, lupus, asthma, recent COVID-19 infection who presented to the hospital with continued abdominal pain, nausea, and failure to thrive.  CT demonstrates widespread cancer with heterogenous contrast uptake in the kidneys.  She is currently being treated with antibiotics for severe sepsis due to pyelonephritis.  Palliative consulted for goals of care.  Recommendations/Plan:  Full code/full scope  Reports pain is better controlled since transition to Dilaudid, however, it can make her sleepy.  Discussed plan to place range on Dilaudid from 0.3 mg for moderate pain to 0.5 mg for severe pain up to every 3 hours as needed to see if her pain can be controlled but sleepiness is improved on lower dose.    Reviewed hospice options and services/support they provide in conjunction with hospice liaison.  We covered scope of hospice services and some of the logistics of how they provide care in the home.  Dr. Florene Glen joined [place] for part of the conversation and we reviewed plan for treatment of her current infection as well as my concern about inability to tolerate TPN moving forward.  We reviewed that TPN is not something that would be covered by hospice.  Again made recommendation consideration for maintaining best quality of life possible through aggressive symptom management and support of hospice care moving forward.  Both Ms. Glendenning and her husband are still working to process where is at in her disease process.  They would like to have a second opinion via virtual visit as discussed yesterday with myself and Dr. Burr Medico.  I will reach out to notify her of desire for second  opinion.  Will plan for follow-up tomorrow to see if any further questions from discussion we had  today.   Goals of Care and Additional Recommendations:  Limitations on Scope of Treatment: Full Scope Treatment  Code Status: .    Code Status Orders  (From admission, onward)         Start     Ordered   02/17/20 2319  Full code  Continuous        02/17/20 2318        Code Status History    Date Active Date Inactive Code Status Order ID Comments User Context   02/01/2020 0919 02/07/2020 2350 Full Code [id number]  Jonnie Finner, DO ED   01/15/2020 2037 01/18/2020 1841 Full Code [id number]  Elwyn Reach, MD ED   11/24/2019 1908 12/12/2019 1944 Full Code [id number]  Eugenie Filler, MD Inpatient   11/04/2019 2125 11/09/2019 2207 Full Code [id number]  Rise Patience, MD ED   04/09/2016 1319 04/09/2016 1952 Full Code [id number]  End, Harrell Gave, MD Inpatient   Advance Care Planning Activity       Prognosis:   < 6 months  Discharge Planning:  To Be Determined- Recommend home with hospice  Care plan was discussed with patient, husband, Dr. Burr Medico, RN, Dr. Florene Glen  Thank you for allowing the Palliative Medicine Team to assist in the care of this patient.   Time In: 1530 Time Out: 1615 Total Time 45 Prolonged Time Billed No   Greater than 50%  of this time was spent counseling and coordinating care related to the above assessment and plan.  Micheline Rough, MD  Please contact Palliative Medicine Team phone at [phone] for questions and concerns.

## 2020-02-21 NOTE — Progress Notes (Signed)
[facility])  Joint visit with Dr. Domingo Cocking, PMT, made with pt and spouse [name] at bedside. Hospital liaison  initiated education related to hospice philosophy and services and to answer any questions at this time.  [name] shared questions about second op verbalized understanding of information given.  Per discussion the plan is to discharge home today by private vehicle.    [place] send signed and completed DNR home with pt/family.  Please provide prescriptions at discharge as needed to ensure ongoing symptom management.    Thank you for the opportunity to participate in this pt's care.  Domenic Moras, BSN, RN [facility] [phone] (24h on call)

## 2020-02-22 DIAGNOSIS — Z515 Encounter for palliative care: Secondary | ICD-10-CM | POA: Diagnosis not present

## 2020-02-22 DIAGNOSIS — R6520 Severe sepsis without septic shock: Secondary | ICD-10-CM | POA: Diagnosis not present

## 2020-02-22 DIAGNOSIS — Z7189 Other specified counseling: Secondary | ICD-10-CM | POA: Diagnosis not present

## 2020-02-22 DIAGNOSIS — N12 Tubulo-interstitial nephritis, not specified as acute or chronic: Secondary | ICD-10-CM | POA: Diagnosis not present

## 2020-02-22 DIAGNOSIS — C787 Secondary malignant neoplasm of liver and intrahepatic bile duct: Secondary | ICD-10-CM | POA: Diagnosis not present

## 2020-02-22 DIAGNOSIS — A419 Sepsis, unspecified organism: Secondary | ICD-10-CM | POA: Diagnosis not present

## 2020-02-22 DIAGNOSIS — C259 Malignant neoplasm of pancreas, unspecified: Secondary | ICD-10-CM | POA: Diagnosis not present

## 2020-02-22 LAB — CBC WITH DIFFERENTIAL/PLATELET
Abs Immature Granulocytes: 0.05 10*3/uL (ref 0.00–0.07)
Basophils Absolute: 0.1 10*3/uL (ref 0.0–0.1)
Basophils Relative: 1 %
Eosinophils Absolute: 0.1 10*3/uL (ref 0.0–0.5)
Eosinophils Relative: 1 %
HCT: 23.9 % — ABNORMAL LOW (ref 36.0–46.0)
Hemoglobin: 7.7 g/dL — ABNORMAL LOW (ref 12.0–15.0)
Immature Granulocytes: 1 %
Lymphocytes Relative: 16 %
Lymphs Abs: 1.3 10*3/uL (ref 0.7–4.0)
MCH: 31.7 pg (ref 26.0–34.0)
MCHC: 32.2 g/dL (ref 30.0–36.0)
MCV: 98.4 fL (ref 80.0–100.0)
Monocytes Absolute: 0.7 10*3/uL (ref 0.1–1.0)
Monocytes Relative: 9 %
Neutro Abs: 6.1 10*3/uL (ref 1.7–7.7)
Neutrophils Relative %: 72 %
Platelets: 176 10*3/uL (ref 150–400)
RBC: 2.43 MIL/uL — ABNORMAL LOW (ref 3.87–5.11)
RDW: 22.6 % — ABNORMAL HIGH (ref 11.5–15.5)
WBC: 8.3 10*3/uL (ref 4.0–10.5)
nRBC: 0.6 % — ABNORMAL HIGH (ref 0.0–0.2)

## 2020-02-22 LAB — COMPREHENSIVE METABOLIC PANEL
ALT: 73 U/L — ABNORMAL HIGH (ref 0–44)
AST: 137 U/L — ABNORMAL HIGH (ref 15–41)
Albumin: 1.7 g/dL — ABNORMAL LOW (ref 3.5–5.0)
Alkaline Phosphatase: 353 U/L — ABNORMAL HIGH (ref 38–126)
Anion gap: 11 (ref 5–15)
BUN: 7 mg/dL (ref 6–20)
CO2: 22 mmol/L (ref 22–32)
Calcium: 8 mg/dL — ABNORMAL LOW (ref 8.9–10.3)
Chloride: 100 mmol/L (ref 98–111)
Creatinine, Ser: 0.47 mg/dL (ref 0.44–1.00)
GFR, Estimated: >60 mL/min (ref >60)
Glucose, Bld: 89 mg/dL (ref 70–99)
Potassium: 3.3 mmol/L — ABNORMAL LOW (ref 3.5–5.1)
Sodium: 133 mmol/L — ABNORMAL LOW (ref 135–145)
Total Bilirubin: 7.6 mg/dL — ABNORMAL HIGH (ref 0.3–1.2)
Total Protein: 7 g/dL (ref 6.5–8.1)

## 2020-02-22 LAB — MAGNESIUM: Magnesium: 1.6 mg/dL — ABNORMAL LOW (ref 1.7–2.4)

## 2020-02-22 LAB — CULTURE, BLOOD (ROUTINE X 2)
Culture: NO GROWTH
Special Requests: ADEQUATE

## 2020-02-22 LAB — PHOSPHORUS: Phosphorus: 3.1 mg/dL (ref 2.5–4.6)

## 2020-02-22 LAB — AMMONIA: Ammonia: 29 umol/L (ref 9–35)

## 2020-02-22 MED ORDER — GUAIFENESIN-DM 100-10 MG/5ML PO SYRP
5.0000 mL | ORAL_SOLUTION | ORAL | Status: DC | PRN
  Administered 2020-02-22 – 2020-02-26 (×2): 5 mL via ORAL
  Filled 2020-02-22 (×2): qty 10

## 2020-02-22 MED ORDER — SODIUM CHLORIDE 0.9% FLUSH: 10.0000 mL | INTRAVENOUS | Status: DC | PRN

## 2020-02-22 MED ORDER — POTASSIUM CHLORIDE 20 MEQ PO PACK
40.0000 meq | PACK | Freq: Once | ORAL | Status: AC
  Administered 2020-02-22: 40 meq via ORAL
  Filled 2020-02-22: qty 2

## 2020-02-22 MED ORDER — MAGNESIUM SULFATE 2 GM/50ML IV SOLN
2.0000 g | Freq: Once | INTRAVENOUS | Status: AC
  Administered 2020-02-22: 2 g via INTRAVENOUS
  Filled 2020-02-22: qty 50

## 2020-02-22 MED ORDER — SODIUM CHLORIDE 0.9% FLUSH
10.0000 mL | Freq: Two times a day (BID) | INTRAVENOUS | Status: DC
  Administered 2020-02-22 – 2020-03-02 (×6): 10 mL

## 2020-02-22 NOTE — Progress Notes (Addendum)
PROGRESS NOTE    Cynthia Frye  [id number] DOB: 25-Sep-1960 DOA: 02/17/2020 PCP: Josetta Huddle, MD   Chief Complaint  Patient presents with  . Weakness  . Dehydration   Brief Narrative:  Cynthia Frye a59 y.o.female,with medical history of pancreatic cancer s/p chemotherapy, lupus, asthma, recent COVID-19 infection,which was treated with remdesivir in the hospital for 3 days during an admission for sepsis of unclear origin who has continued to have abdominal pain, nausea, and failure to thrive at home prompting return to the ED 1/23. She was tachycardic, tachypneic with CT abd/pelvis demonstrating known widespread cancer and mild heterogenous contrast uptake in kidneys bilaterally. She was given broad spectrum antibiotics and sepsis-dose IV fluids and admitted to SDU.  Assessment & Plan:   Active Problems:   Severe sepsis [facility])   Pancreatic cancer metastasized to liver [facility])   Pyelonephritis  Goals of care discussion: The patient has advanced cancer which has progressed despite treatment (which she has tolerated poorly), now no longer on treatment of any kind. Hospice has been recommended.  - goals of care meeting this AM with Dr. Burr Medico and Dr. Domingo Cocking, appreciate their assistance -> recommended focus on symptom management and quality of like - consider hospice acare at home after discharge - they asked for 2nd opinion, which Dr. Burr Medico going to look into - appreciate palliative care assistance with symptom management  Acute Metabolic Encephalopathy: seems sluggish, slow to respond today.  ? meds vs liver failure?  Follow ammonia.    Severe sepsis due to pyelonephritis: Diagnosed at admission based on tachycardia, tachypnea, abnormalities of kidneys on CT abdomen.  - Febrile 1/24. Urine culture growing Enterococcus faecalis > susceptibility to ampicillin (1/25 - present - currently on day 6 when including vanc) plan for 10 days - Follow blood cultures, repeat cultures now since  febrile.  Recent covid-19 infection: +SARS-CoV-2 testing 1/7. s/p Pfizer July, Aug 2021. Treated with some remdesivir, never hypoxic to require steroids. Has completed 10 days of isolation.  - DC isolation.  Acute left CFV DVT, chronic right-sided pulmonary emboli: Left iliac vein DVT (10/2019), DVT in the left peroneal and gastrocnemius veins (01/07/2020), DVT in the left common femoral vein (02/01/2020) - Lovenox 1mg /kg q12h  Hyponatremia:  - Continue LR   Nausea, vomiting, intolerance to enteral feeding, GERD, severe protein calorie malnutrition: Prealbumin undetectable at recent hospitalization. - Maximize antiemetics, hold TPN for now.   Metastatic pancreatic cancer, elevated LFTs: Progressive despite chemotherapy which is also poorly tolerated. No longer on chemotherapy, [place] conversations as noted above.  - bilirubin rising, 7.6 today, continue to follow (no intrahepatic or extrahepatic ductal dilatation on 1/23 CT scan) - discussed with Dr. Burr Medico - likely 2/2 metastatic pancreatic cancer - Palliative care consulted  Pancytopenia, anemia of malignancy:  - fluctuating Hb, continue to monitor - 7.7 today - transfuse for <7  Thrombocytopenia: resolved  SLE: No flare. Hold home meds for now  HTN:  - Restart metoprolol. Hold other home meds with softer BPs on admit. Restart when rising BP.  DVT prophylaxis: lovenox Code Status: full  Family Communication: called husband, no answer 1/28 Disposition:   Status is: Inpatient  Remains inpatient appropriate because:Inpatient level of care appropriate due to severity of illness   Dispo: The patient is from: Home              Anticipated d/c is to: Home              Anticipated d/c date is: > 3 days  Patient currently is not medically stable to d/c.   Difficult to place patient No       Consultants:   Palliative care  oncology  Procedures:  none  Antimicrobials:  Anti-infectives (From admission,  onward)   Start     Dose/Rate Route Frequency Ordered Stop   02/19/20 1315  ampicillin (OMNIPEN) 2 g in sodium chloride 0.9 % 100 mL IVPB        2 g 300 mL/hr over 20 Minutes Intravenous Every 6 hours 02/19/20 1218     02/18/20 1400  cefTRIAXone (ROCEPHIN) 2 g in sodium chloride 0.9 % 100 mL IVPB  Status:  Discontinued        2 g 200 mL/hr over 30 Minutes Intravenous Every 24 hours 02/18/20 0644 02/19/20 1218   02/18/20 0200  vancomycin (VANCOCIN) IVPB 1000 mg/200 mL premix  Status:  Discontinued        1,000 mg 200 mL/hr over 60 Minutes Intravenous Every 12 hours 02/17/20 1847 02/18/20 0644   02/17/20 2100  ceFEPIme (MAXIPIME) 2 g in sodium chloride 0.9 % 100 mL IVPB  Status:  Discontinued        2 g 200 mL/hr over 30 Minutes Intravenous Every 8 hours 02/17/20 1841 02/18/20 0644   02/17/20 1400  vancomycin (VANCOREADY) IVPB 2000 mg/400 mL        2,000 mg 200 mL/hr over 120 Minutes Intravenous  Once 02/17/20 1308 02/17/20 2011   02/17/20 1300  ceFEPIme (MAXIPIME) 2 g in sodium chloride 0.9 % 100 mL IVPB        2 g 200 mL/hr over 30 Minutes Intravenous  Once 02/17/20 1251 02/17/20 1350   02/17/20 1300  metroNIDAZOLE (FLAGYL) IVPB 500 mg        500 mg 100 mL/hr over 60 Minutes Intravenous  Once 02/17/20 1251 02/17/20 1502      Subjective: She's sleepy, not so talkative today, flat affect  Objective: Vitals:   02/21/20 0500 02/21/20 1306 02/21/20 2025 02/22/20 0318  BP: 119/70 110/79 134/89 121/86  Pulse: (!) 110 (!) 101 (!) 109 (!) 110  Resp: 20 20 18 15   Temp: 98.6 F (37 C) 98.3 F (36.8 C) 99.5 F (37.5 C) 98.2 F (36.8 C)  TempSrc: Oral Oral Oral Oral  SpO2: 97% 99% 99% 99%  Weight:      Height:        Intake/Output Summary (Last 24 hours) at 02/22/2020 1541 Last data filed at 02/22/2020 1518 Gross per 24 hour  Intake 3446.36 ml  Output -  Net 3446.36 ml   Filed Weights   02/17/20 2345  Weight: 79.4 kg    Examination:  General: flat affect, sleepy, sluggish  today Cardiovascular: RRR Lungs: unlabored. Abdomen: Soft, nontender, nondistended Neurological: Alert and oriented - seems sluggish today. Moves all extremities 4. Cranial nerves II through XII grossly intact. Skin: Warm and dry. No rashes or lesions. Extremities: No clubbing or cyanosis. No edema.  Flat affect   Data Reviewed: I have personally reviewed following labs and imaging studies  CBC: Recent Labs  Lab 02/17/20 1236 02/18/20 0300 02/19/20 1438 02/20/20 0500 02/21/20 0453 02/22/20 0549  WBC 9.3 6.1 6.8 6.8 7.4 8.3  NEUTROABS 5.9  --  4.9 4.6 5.0 6.1  HGB 9.4* 7.6* 8.7* 8.0* 7.7* 7.7*  HCT 30.4* 24.0* 27.1* 24.7* 24.2* 23.9*  MCV 101.3* 100.8* 99.3 97.2 99.2 98.4  PLT 189 133* 157 151 157 811    Basic Metabolic Panel: Recent Labs  Lab 02/18/20  0300 02/19/20 1438 02/20/20 0500 02/21/20 0453 02/22/20 0549  NA 133* 133* 133* 134* 133*  K 4.0 3.5 3.1* 3.4* 3.3*  CL 103 102 101 103 100  CO2 22 23 22 23 22   GLUCOSE 71 86 78 76 89  BUN 18 8 8 8 7   CREATININE 0.57 0.50 0.48 0.46 0.47  CALCIUM 8.3* 8.5* 8.3* 8.1* 8.0*  MG  --   --   --  1.5* 1.6*  PHOS  --   --   --  2.4* 3.1    GFR: Estimated Creatinine Clearance: 83.1 mL/min (by C-G formula based on SCr of 0.47 mg/dL).  Liver Function Tests: Recent Labs  Lab 02/18/20 0300 02/19/20 1438 02/20/20 0500 02/21/20 0453 02/22/20 0549  AST 148* 157* 147* 142* 137*  ALT 71* 81* 76* 73* 73*  ALKPHOS 214* 311* 336* 336* 353*  BILITOT 4.7* 5.8* 6.3* 6.8* 7.6*  PROT 6.5 6.9 6.7 6.4* 7.0  ALBUMIN 1.7* 1.8* 1.7* 1.6* 1.7*    CBG: Recent Labs  Lab 02/18/20 0540  GLUCAP 71     Recent Results (from the past 240 hour(s))  Blood Culture (routine x 2)     Status: None   Collection Time: 02/17/20  1:00 PM   Specimen: BLOOD  Result Value Ref Range Status   Specimen Description   Final    BLOOD SITE NOT SPECIFIED Performed at [facility], [address], [place], [place] [address]    Special  Requests   Final    BOTTLES DRAWN AEROBIC AND ANAEROBIC Blood Culture adequate volume Performed at [facility] [address] [place], [place] [address]    Culture   Final    NO GROWTH 5 DAYS Performed at [facility], [place] [address], [place], [place] [address]    Report Status 02/22/2020 FINAL  Final  Urine culture     Status: Abnormal   Collection Time: 02/17/20  8:20 PM   Specimen: In/Out Cath Urine  Result Value Ref Range Status   Specimen Description   Final    IN/OUT CATH URINE Performed at [facility] [address], [place], [place] [address]    Special Requests   Final    NONE Performed at [facility], [place] [address], [place], [place] [address]    Culture 40,000 COLONIES/mL ENTEROCOCCUS FAECALIS ([place])  Final   Report Status 02/20/2020 FINAL  Final   Organism ID, Bacteria ENTEROCOCCUS FAECALIS ([facility])  Final      Susceptibility   Enterococcus faecalis - MIC*    AMPICILLIN <=2 SENSITIVE Sensitive     NITROFURANTOIN <=16 SENSITIVE Sensitive     VANCOMYCIN 1 SENSITIVE Sensitive     * 40,000 COLONIES/mL ENTEROCOCCUS FAECALIS  MRSA PCR Screening     Status: None   Collection Time: 02/17/20 11:18 PM   Specimen: Nasal Mucosa; Nasopharyngeal  Result Value Ref Range Status   MRSA by PCR NEGATIVE NEGATIVE Final    Comment:        The GeneXpert MRSA Assay (FDA approved for NASAL specimens only), is one component of [facility] comprehensive MRSA colonization surveillance program. It is not intended to diagnose MRSA infection nor to guide or monitor treatment for MRSA infections. Performed at [facility], [place] [address], [place], [place] [address]   Culture, blood (routine x 2)     Status: None (Preliminary result)   Collection Time: 02/19/20  1:27 PM   Specimen: BLOOD  Result  Value Ref Range Status   Specimen Description   Final    BLOOD RIGHT ANTECUBITAL Performed at [facility] [address], [place], [place] [address]    Special Requests   Final    BOTTLES DRAWN AEROBIC ONLY Blood Culture results may not be optimal due to an inadequate volume of blood received in culture bottles Performed at [facility] [address], [place], [place] [address]    Culture   Final    NO GROWTH 3 DAYS Performed at [facility], [place] [address] [place], [place] [address]    Report Status PENDING  Incomplete  Culture, blood (routine x 2)     Status: None (Preliminary result)   Collection Time: 02/19/20  1:34 PM   Specimen: BLOOD RIGHT HAND  Result Value Ref Range Status   Specimen Description   Final    BLOOD RIGHT HAND Performed at [facility] [address], [place], [place] [address]    Special Requests   Final    BOTTLES DRAWN AEROBIC ONLY Blood Culture results may not be optimal due to an inadequate volume of blood received in culture bottles Performed at [facility] [address], [place], [place] [address]    Culture   Final    NO GROWTH 3 DAYS Performed at [facility], [place] [address] [place], [place] [address]    Report Status PENDING  Incomplete         Radiology Studies: No results found.      Scheduled Meds: . Chlorhexidine Gluconate Cloth  6 each Topical Daily  . dronabinol  2.5 mg Oral BID AC  . enoxaparin  80 mg Subcutaneous Q12H  . feeding supplement  237 mL Oral BID BM  . mouth rinse  15 mL Mouth Rinse BID  . metoprolol succinate  50 mg Oral Daily  . sucralfate  1 g Oral TID WC & HS   Continuous Infusions: . ampicillin (OMNIPEN) IV 2 g (02/22/20 1209)  . lactated ringers 100 mL/hr at 02/21/20 2158     LOS: 5 days    Time spent: over 30 min    Fayrene Helper, MD [facility]   To contact the attending provider between 7A-7P or the covering provider during after hours 7P-7A, please log into the web site [email or web address] and access using  universal [place] password for that web site. If you do not have the password, please call the hospital operator.  02/22/2020, 3:41 PM

## 2020-02-22 NOTE — Progress Notes (Signed)
I received the pt from the 4th floor and full report received from Bonnie Brae. Pt oriented to room, call light, and white board. Call light is within reach. Ice water and extra pillow given. Pt endorsed pain well controlled at this time, she did not need to use the toilet, and was comfortable in the position she was in. [facility] is sleepy but alert and oriented. Pt's husband at bedside and wearing a mask. PIV patent and chest port infusing without complication at this time. I will continue to monitor pt with hourly, purposeful rounding.

## 2020-02-22 NOTE — Progress Notes (Addendum)
HEMATOLOGY-ONCOLOGY PROGRESS NOTE  SUBJECTIVE: The patient is having some abdominal discomfort at the time of visit.  She is requesting pain medication.  Denies nausea and vomiting at this time.  She offers no other complaints today.  No family at the bedside.  Oncology History Overview Note  Cancer Staging Pancreatic cancer [facility]) Staging form: Exocrine Pancreas, AJCC 8th Edition - Clinical stage from 10/18/2019: Stage IV (cT3, cN1, cM1) - Signed by Truitt Merle, MD on 10/18/2019    Pancreatic cancer ([place])  09/19/2019 Imaging   CT AP w contrast 09/19/19  IMPRESSION: 1. Findings are highly concerning for probable primary pancreatic adenocarcinoma in the anterior aspect of the pancreatic head. Several prominent borderline enlarged lymph nodes are noted in the hepatoduodenal nodal station, and there are multiple indeterminate liver lesions which are highly concerning for probable hepatic metastases. Further evaluation with nonemergent abdominal MRI with and without IV gadolinium with MRCP is recommended in the near future to better evaluate these findings.   09/25/2019 Procedure   Upper EUS by Dr Paulita Fujita  IMPRESSION -There was no evidence of significant pathology in the left lobe of the liver.  -A few lymph nodes were visualized and measures in the peripancreatic region and porta hepata region.  -Hyperchoic material consistent with sludge was visualized endosonographically in the gallbladder.  -There was no sign of significant pathology in the common bile duct.  -A mass was identified in the pancreatic head. If biopsy results show adenocarcinoma, it would be staged T3N1Mx by endosonographic criteria. Fine needle aspiration performed.    09/25/2019 Initial Biopsy   FINAL MICROSCOPIC DIAGNOSIS: Fine needle aspirate, Pancreas;  MALIGNANT CELLS PRESENT CONSISTENT WITH ADENOCARCINOMA.    10/02/2019 Initial Diagnosis   Pancreatic cancer ([place])   10/11/2019 Procedure   PAC placement y Dr Barry Dienes     10/15/2019 Imaging   CT Chest  IMPRESSION: 1. Small anterior left pneumothorax with dependent atelectasis in the left lower lobe. 2. Increased number of bilateral axillary and subpectoral lymph nodes with mild lymphadenopathy in the left axilla. While this would be an atypical presentation for metastatic pancreatic cancer, this possibility is not excluded 3. Main duct dilatation in the pancreas, better assessed on abdomen CT 09/19/2019.   10/16/2019 Imaging   MRI Abdomen  IMPRESSION: 1. Substantially motion degraded scan. 2. Probable persistent small anterior left lung base pneumothorax, better seen on chest CT from 1 day prior. 3. Poorly marginated hypoenhancing 3.7 x 2.9 cm pancreatic head mass, which appears to invade the anterior peripancreatic fat, compatible with known pancreatic adenocarcinoma. Diffuse irregular dilatation of the main pancreatic duct in the pancreatic body and tail. Mild narrowing of the main portal vein by the mass. Abdominal vasculature remains patent and otherwise uninvolved. 4. Numerous (greater than 10) small liver masses scattered throughout the liver, largest 1.0 cm, which appear to demonstrate targetoid enhancement on the limited motion degraded postcontrast sequences, compatible with liver metastases. 5. Mild porta hepatis adenopathy, suspicious for metastatic disease.   10/18/2019 Cancer Staging   Staging form: Exocrine Pancreas, AJCC 8th Edition - Clinical stage from 10/18/2019: Stage IV (cT3, cN1, cM1) - Signed by Truitt Merle, MD on 10/18/2019   10/24/2019 - 11/21/2019 Chemotherapy   First-line FOLFIRINOX q2weeks starting 10/24/19. C2 postponed due to N/V/D and dose reduced 20-30%. Given poor tolerance, stopped after 2 cycles.    10/30/2019 Pathology Results   FINAL MICROSCOPIC DIAGNOSIS:   A. LIVER, LESION, BIOPSY:  -  Metastatic carcinoma  -  See comment   COMMENT:  By immunohistochemistry, the neoplastic cells are positive for   cytokeratin 7 and GATA3 with patchy nonspecific staining for PAX 8 but  negative for TTF-1, CDX2 and cytokeratin 20.  Overall, the findings are  consistent with metastasis of the patient's known breast carcinoma.  Prognostic panel (ER, PR, Her-2) is pending and will be reported in an  addendum.    ADDENDUM:   Dr. Laurence Ferrari notified us (November 01, 2019) that the patient was also  being worked up for a pancreatic mass.  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  In addition, ER and PR  are negative.  Gata-3 can be expressed in the pancreatic  adenocarcinomas; therefore, pancreatobiliary primary remains in the  differential.    11/02/2019 Genetic Testing   Negative genetic testing: no pathogenic variants detected in [facility] Common Hereditary Cancers Panel.  The report date is November 02, 2019.   The Common Hereditary Cancers Panel offered by [facility] includes sequencing and/or deletion duplication testing of the following 48 genes: APC, ATM, AXIN2, BARD1, BMPR1A, BRCA1, BRCA2, BRIP1, CDH1, CDK4, CDKN2A (p14ARF), CDKN2A (p16INK4a), CHEK2, CTNNA1, DICER1, EPCAM (Deletion/duplication testing only), GREM1 (promoter region deletion/duplication testing only), KIT, MEN1, MLH1, MSH2, MSH3, MSH6, MUTYH, NBN, NF1, NHTL1, PALB2, PDGFRA, PMS2, POLD1, POLE, PTEN, RAD50, RAD51C, RAD51D, RNF43, SDHB, SDHC, SDHD, SMAD4, SMARCA4. STK11, TP53, TSC1, TSC2, and VHL.  The following genes were evaluated for sequence changes only: SDHA and HOXB13 c.251G>A variant only.   11/04/2019 Imaging   CT AP  IMPRESSION: 1. Circumferential bowel wall thickening with adjacent fat stranding throughout the colon, most predominant in the transverse colon. This is consistent with colitis, which may be infectious or inflammatory in etiology. 2. Ill-defined pancreatic head mass consistent with known malignancy, similar to mildly increased in comparison to prior CT. There are innumerable  hypodense masses throughout the liver, increased in conspicuity in comparison to prior CT. Findings are worrisome for worsening metastatic disease. 3. Filling defect in the LEFT internal iliac vein, likely a thrombus with differential considerations including mixing artifact.   11/24/2019 Imaging   CT AP  IMPRESSION: Pancreatic head mass again noted compatible with patient's given history of pancreatic cancer.   Numerous metastases throughout the liver, enlarging since prior study.     12/10/2019 Procedure   Upper endoscopy by Dr Michail Sermon  IMPRESSION - Z-line regular, 42 cm from the incisors. - Esophageal ulcer with stigmata of recent bleeding. - Gastritis. Biopsied. - Mucosal changes in the duodenum. Biopsied. - Normal second portion of the duodenum. - Gastric and duodenal thickening concerning for inflammation from mets or due to mets from known pancreatic cancer.   FINAL MICROSCOPIC DIAGNOSIS:   A. DUODENUM, BIOPSY:  - Benign duodenal mucosa.  - No dysplasia or malignancy.   B. STOMACH, BIOPSY:  - Antral mucosa with mild chronic active inflammation.  - No intestinal metaplasia, dysplasia or carcinoma.   12/28/2019 -  Chemotherapy   Change her to second-line Gemcitabine 2 weeks on/1 week off starting 12/28/19.    01/16/2020 Imaging   MRI abdomen  IMPRESSION: 1. Prominently dilated dorsal pancreatic duct extending to the level of the mass in the junction of the pancreatic head and body. The mass abuts the gastric wall, portal vein, and common bile duct, leading to mild narrowing of the common bile duct down to about 0.3 cm, with the common hepatic duct mildly prominent 0.9 cm. The mass has mildly enlarged compared to the CT examination of 11/24/2019. 2. Extensive metastatic burden  throughout all lobes of the liver, substantially increased from 11/24/2019 and fairly similar to 01/11/2020. 3. Trace left pleural effusion with adjacent atelectasis in the left lower  lobe. Borderline cardiomegaly. 4. Sludge in the gallbladder with gallbladder wall thickening.   01/24/2020 Imaging   CT AP  IMPRESSION: 1. No acute abnormality. 2. No significant change in the known pancreatic malignancy with associated marked dilatation of the pancreatic duct. 3. Stable extensive liver metastatic disease.      REVIEW OF SYSTEMS:   Constitutional: Denies fevers and chills Eyes: Denies blurriness of vision Ears, nose, mouth, throat, and face: Denies mucositis or sore throat Respiratory: Denies cough, dyspnea or wheezes Cardiovascular: Denies palpitation, chest discomfort Gastrointestinal: Reports abdominal pain this morning without nausea or vomiting Skin: Denies abnormal skin rashes Lymphatics: Denies new lymphadenopathy or easy bruising Neurological:Denies numbness, tingling or new weaknesses Behavioral/Psych: Mood is stable, no new changes  Extremities: No lower extremity edema All other systems were reviewed with the patient and are negative.  I have reviewed the past medical history, past surgical history, social history and family history with the patient and they are unchanged from previous note.   PHYSICAL EXAMINATION: ECOG PERFORMANCE STATUS: 3 - Symptomatic, >50% confined to bed  Vitals:   02/21/20 2025 02/22/20 0318  BP: 134/89 121/86  Pulse: (!) 109 (!) 110  Resp: 18 15  Temp: 99.5 F (37.5 C) 98.2 F (36.8 C)  SpO2: 99% 99%   Filed Weights   02/17/20 2345  Weight: 79.4 kg    Intake/Output from previous day: 01/27 0701 - 01/28 0700 In: 2017.1 [I.V.:2017.1] Out: -   GENERAL:alert, no distress and comfortable SKIN: skin color, texture, turgor are normal, no rashes or significant lesions EYES: normal, Conjunctiva are pink and non-injected, sclera clear Musculoskeletal:no cyanosis of digits and no clubbing  NEURO: alert & oriented x 3 with fluent speech, no focal motor/sensory deficits  LABORATORY DATA:  I have reviewed the data as  listed CMP Latest Ref Rng & Units 02/22/2020 02/21/2020 02/20/2020  Glucose 70 - 99 mg/dL 89 76 78  BUN 6 - 20 mg/dL '7 8 8  ' Creatinine 0.44 - 1.00 mg/dL 0.47 0.46 0.48  Sodium 135 - 145 mmol/L 133(L) 134(L) 133(L)  Potassium 3.5 - 5.1 mmol/L 3.3(L) 3.4(L) 3.1(L)  Chloride 98 - 111 mmol/L 100 103 101  CO2 22 - 32 mmol/L '22 23 22  ' Calcium 8.9 - 10.3 mg/dL 8.0(L) 8.1(L) 8.3(L)  Total Protein 6.5 - 8.1 g/dL 7.0 6.4(L) 6.7  Total Bilirubin 0.3 - 1.2 mg/dL 7.6(H) 6.8(H) 6.3(H)  Alkaline Phos 38 - 126 U/L 353(H) 336(H) 336(H)  AST 15 - 41 U/L 137(H) 142(H) 147(H)  ALT 0 - 44 U/L 73(H) 73(H) 76(H)    Lab Results  Component Value Date   WBC 8.3 02/22/2020   HGB 7.7 (L) 02/22/2020   HCT 23.9 (L) 02/22/2020   MCV 98.4 02/22/2020   PLT 176 02/22/2020   NEUTROABS 6.1 02/22/2020    CT ANGIO CHEST PE W OR WO CONTRAST  Result Date: 02/01/2020 CLINICAL DATA:  Increasing right rib pain for 1 day. Nausea and vomiting EXAM: CT ANGIOGRAPHY CHEST WITH CONTRAST TECHNIQUE: Multidetector CT imaging of the chest was performed using the standard protocol during bolus administration of intravenous contrast. Multiplanar CT image reconstructions and MIPs were obtained to evaluate the vascular anatomy. CONTRAST:  161m OMNIPAQUE IOHEXOL 350 MG/ML SOLN COMPARISON:  01/11/2020 FINDINGS: Cardiovascular: Normal heart size. No pericardial effusion. No acute aortic finding. Extensive motion and streak  artifact. Pulmonary emboli are again seen at the bifurcation of the right main pulmonary artery, peripheral within the vessel and nonacute. More peripheral pulmonary arteries are not reliably characterized due to the degree of artifact. No definite progression from 01/11/2020. Mediastinum/Nodes: Mild enlargement of bilateral axillary lymph nodes which may be related to history of connective tissue disease. No mediastinal adenopathy. Lungs/Pleura: Reportedly patient is COVID positive per the chart. There is no edema, consolidation,  effusion, or pneumothorax. Small opacity at the lingula which has a scar-like appearance, also seen on prior. No suspicious pulmonary nodules. Upper Abdomen: Reported separately Musculoskeletal: No acute finding. Review of the MIP images confirms the above findings. IMPRESSION: 1. Very limited CTA due to the extent of motion and streak artifact. Persisting nonacute right-sided pulmonary emboli as seen on 01/11/2020 chest CTA. No clear progression of emboli. DVT is present at the left common femoral vein on prior abdominal CT. 2. COVID positivity without pneumonia. Electronically Signed   By: Monte Fantasia M.D.   On: 02/01/2020 04:53   CT ABDOMEN PELVIS W CONTRAST  Result Date: 02/17/2020 CLINICAL DATA:  Abdominal pain, metastatic pancreatic cancer, COVID positive EXAM: CT ABDOMEN AND PELVIS WITH CONTRAST TECHNIQUE: Multidetector CT imaging of the abdomen and pelvis was performed using the standard protocol following bolus administration of intravenous contrast. CONTRAST:  136m OMNIPAQUE IOHEXOL 300 MG/ML  SOLN COMPARISON:  02/06/2020 FINDINGS: Lower chest: Scarring/atelectasis in the lingula and left lower lobe. Hepatobiliary: Innumerable hepatic metastases, similar to recent CT. Gallbladder is unremarkable, noting mild gallbladder wall edema, similar. No intrahepatic or extrahepatic ductal dilatation. Pancreas: 3.9 x 2.9 cm mass in the pancreatic head (series 2/image 46), similar, corresponding to the patient's known pancreatic adenocarcinoma. Atrophy of the pancreatic body/tail with ductal dilatation. Spleen: Within normal limits. Adrenals/Urinary Tract: Adrenal glands are within normal limits. Segmental/wedge-shaped hypoperfusion involving the posterolateral right lower kidney (series 2/images 51-52). Associated differential enhancement involving the posterior left lower kidney (series 7/image 43). These findings are nonspecific but are most suggestive of pyelonephritis. No hydronephrosis. Bladder is  within normal limits. Stomach/Bowel: Stomach is within normal limits. No evidence of bowel obstruction. Appendix is not discretely visualized. No colonic wall thickening or inflammatory changes. Vascular/Lymphatic: No evidence of abdominal aortic aneurysm. Stable DVT in the left common femoral vein (series 2/image 93). 14 mm short axis portacaval node (series 2/image 41), unchanged. No suspicious pelvic lymphadenopathy. Reproductive: Status post hysterectomy. No adnexal masses. Other: Small to moderate abdominopelvic ascites. Associated mild pelvic peritoneal thickening. Musculoskeletal: Mild degenerative changes at L5-S1. IMPRESSION: Mildly heterogeneous perfusion involving the bilateral kidneys, nonspecific but most suggestive of pyelonephritis. Otherwise, no interval change from recent CT. Pancreatic adenocarcinoma. Innumerable hepatic metastases. Small upper abdominal nodal metastasis. Small to moderate abdominopelvic ascites with mild peritoneal thickening, likely malignant. Stable DVT in the left common femoral vein. Electronically Signed   By: SJulian HyM.D.   On: 02/17/2020 16:18   CT ABDOMEN PELVIS W CONTRAST  Result Date: 02/06/2020 CLINICAL DATA:  Abdominal pain and fever. Metastatic pancreatic adenocarcinoma. EXAM: CT ABDOMEN AND PELVIS WITH CONTRAST TECHNIQUE: Multidetector CT imaging of the abdomen and pelvis was performed using the standard protocol following bolus administration of intravenous contrast. CONTRAST:  1051mOMNIPAQUE IOHEXOL 300 MG/ML  SOLN COMPARISON:  CT 5 days ago 02/01/2020 FINDINGS: Lower chest: Stable lingular opacity. Slight increase in bibasilar atelectasis. No pleural fluid. Hepatobiliary: Innumerable hepatic metastasis. No significant change in the short interim. Layering sludge in the gallbladder. Mild gallbladder wall thickening. There is no biliary obstruction  or discrete stone. Pancreas: Pancreatic head mass measures 3.4 cm prom distal pancreatic ductal  dilatation with pancreatic atrophy unchanged. No adjacent inflammatory change. Spleen: Normal in size without focal abnormality. Adrenals/Urinary Tract: No adrenal nodule. The previous low-density band in the lower left kidney has improved and near completely resolved. There is homogeneous renal enhancement with symmetric excretion on delayed phase imaging. Unremarkable urinary bladder. Stomach/Bowel: Nondistended stomach. Pancreatic mass abuts the distal gastric body without gastric outlet obstruction. Small amount of enteric contrast is seen involving distal small bowel and throughout the colon. No obstruction. Suggestion of small bowel thickening and enhancement involving short segment in the left lower quadrant, series 2, image 71. There is improved submucosal low-density in the small bowel from prior. Submucosal fatty deposition of the ascending colon is again seen. Slight increase in submucosal fatty deposition involving the transverse and descending colon. Vascular/Lymphatic: Left femoral DVT again seen. No progression from prior. No portal vein thrombosis. Normal caliber abdominal aorta. No acute vascular findings. Enlarged periportal node, largest measuring 14 mm, unchanged. No progressive adenopathy in the short interim. Reproductive: Hysterectomy.  Ovaries not delineated. Other: Small volume abdominopelvic ascites, which has slightly progressed from prior exam. Peritoneal thickening in the right pericolic gutter is again seen. Mild generalized omental and mesenteric edema. Patchy subcutaneous densities in the anterior abdominal wall with foci of air typical of medication injection sites. Musculoskeletal: No focal bone lesion or acute osseous abnormality. IMPRESSION: 1. Short segment loop of small bowel with wall thickening and mild enhancement in the left lower quadrant may represent focal enteritis. 2. Pancreatic head mass with innumerable hepatic metastasis. Peritoneal thickening in the right pericolic  gutter is again seen. No significant change from CT 5 days ago. 3. Small volume abdominopelvic ascites, slightly progressed from prior exam. 4. Improved bandlike hypodensity in the lower left kidney, near completely resolved. 5. Gallbladder sludge. Gallbladder wall thickening or small amount of pericholecystic fluid, likely reactive. 6. Left femoral DVT again seen. Electronically Signed   By: Keith Rake M.D.   On: 02/06/2020 19:48   CT ABDOMEN PELVIS W CONTRAST  Result Date: 02/01/2020 CLINICAL DATA:  Acute right flank pain.  Pancreatic cancer EXAM: CT ABDOMEN AND PELVIS WITH CONTRAST TECHNIQUE: Multidetector CT imaging of the abdomen and pelvis was performed using the standard protocol following bolus administration of intravenous contrast. CONTRAST:  136m OMNIPAQUE IOHEXOL 350 MG/ML SOLN COMPARISON:  01/24/2020 FINDINGS: Lower chest:  Reported separately Hepatobiliary: Innumerable metastases throughout the liver. Gallbladder sludge or calculi is likely. No evidence of acute cholecystitis. Pancreas: Mass at the pancreatic head with marked proximal ductal dilatation and atrophy. The mass measures approximately 3 cm. No acute superimposed inflammation. Spleen: Unremarkable. Adrenals/Urinary Tract: Negative adrenals. There is a band of low-density in the posterior and lower left kidney. No hydronephrosis. Unremarkable bladder. Stomach/Bowel: Extensive submucosal low-density within small bowel proximal colon which appears fairly well-defined and has the appearance of fat deposition, also seen on prior but more prominent today. No bowel obstruction or clear bowel edema. Gastric outlet is distorted by the pancreatic mass, but no gastric dilatation. The appendix is not well visualized. No pericecal inflammation. Vascular/Lymphatic: DVT in the left common femoral vein which is resolved by the level of the common femoral bifurcation. This finding will be called in conjunction with chest CT findings. Prominent  lymph nodes in the deep liver drainage about the pancreatic head, similar and likely metastatic. Reproductive:Hysterectomy. Other: Small volume ascites in the pelvis. Minimal interloop fluid is also seen. No discrete  peritoneal nodularity but there is likely mild thickening of the peritoneum the upper right pericolic gutter. Musculoskeletal: No acute abnormalities. IMPRESSION: 1. DVT in the left common femoral vein, nonobstructive. 2. Band of hypoenhancement in the lower left kidney, contralateral to site of symptoms. Pyelonephritis is considered, please correlate with urinalysis. 3. Known pancreas cancer with extensive liver metastases. 4. Small volume ascites with possible peritoneal thickening, concerning for early peritoneal metastatic disease. 5. Gallbladder sludge or calculi. No evidence of acute cholecystitis. Electronically Signed   By: Monte Fantasia M.D.   On: 02/01/2020 04:47   CT Abdomen Pelvis W Contrast  Result Date: 01/24/2020 CLINICAL DATA:  Abdominal pain for the past 2 days. Constipation. Ongoing chemotherapy for pancreatic cancer. EXAM: CT ABDOMEN AND PELVIS WITH CONTRAST TECHNIQUE: Multidetector CT imaging of the abdomen and pelvis was performed using the standard protocol following bolus administration of intravenous contrast. CONTRAST:  117m OMNIPAQUE IOHEXOL 300 MG/ML  SOLN COMPARISON:  Abdomen MR dated 01/16/2020. Chest CTA dated 01/11/2020. Abdomen and pelvis CT dated 11/24/2019. FINDINGS: Lower chest: Mildly progressive linear and mass-like atelectasis at the left lung base with no significant change in linear atelectasis or scarring at the right lung base. Hepatobiliary: Large number of masses throughout the liver without significant change since 01/16/2020. Unremarkable gallbladder. Pancreas: No significant change in the mass in the pancreas at the level of the junction of the pancreatic head and body since 01/16/2020 with associated marked dilatation of the pancreatic duct.  Spleen: Unremarkable. Adrenals/Urinary Tract: Adrenal glands are unremarkable. Kidneys are normal, without renal calculi, focal lesion, or hydronephrosis. Bladder is unremarkable. Stomach/Bowel: Minimal diffuse enlargement of the appendix with mild diffuse low density wall thickening without periappendiceal soft tissue stranding. The appendix measures 9 mm in maximum diameter, similar to 11/24/2019. Unremarkable stomach, small bowel and colon. No significant stool in the colon. Vascular/Lymphatic: No significant vascular findings are present. No enlarged abdominal or pelvic lymph nodes. Reproductive: Status post hysterectomy. No adnexal masses. Other: No abdominal wall hernia or abnormality. No abdominopelvic ascites. Musculoskeletal: L5-S1 degenerative changes. IMPRESSION: 1. No acute abnormality. 2. No significant change in the known pancreatic malignancy with associated marked dilatation of the pancreatic duct. 3. Stable extensive liver metastatic disease. Electronically Signed   By: SClaudie ReveringM.D.   On: 01/24/2020 10:57   DG Chest Port 1 View  Result Date: 02/17/2020 CLINICAL DATA:  Weakness and dehydration. Recent history of COVID-19. EXAM: PORTABLE CHEST 1 VIEW COMPARISON:  Chest radiograph 02/01/2020. FINDINGS: Port-A-Cath tip projects over the superior vena cava. Monitoring leads overlie the patient. Stable cardiac and mediastinal contours. Low lung volumes with bibasilar opacities favored represent atelectasis. No pleural effusion or pneumothorax. IMPRESSION: Low lung volumes with bibasilar atelectasis. Electronically Signed   By: DLovey NewcomerM.D.   On: 02/17/2020 13:29   DG Chest Port 1 View  Result Date: 02/01/2020 CLINICAL DATA:  Sepsis, right anterior chest pain, history of pancreatic cancer EXAM: PORTABLE CHEST 1 VIEW COMPARISON:  11/24/2019 FINDINGS: Single frontal view of the chest demonstrates stable left chest wall port. Cardiac silhouette is unremarkable. Lung volumes are diminished,  with no acute airspace disease, effusion, or pneumothorax. No acute bony abnormalities. IMPRESSION: 1. Low lung volumes.  No acute process. Electronically Signed   By: MRanda NgoM.D.   On: 02/01/2020 01:32    ASSESSMENT AND PLAN: 1. Metastatic pancreatic cancer 2. Severe sepsis secondary to pyelonephritis 3. Abdominal pain, nausea and vomiting 4. Left iliac DVT, left peroneal and gastrocnemius vein DVT 5. Anemia  6. Thrombocytopenia 7. Transaminitis and hyperbilirubinemia secondary to #1, worsening  8. Protein calorie malnutrition 9. Goals of care discussion   -The patient has had disease progression despite multiple lines of therapy along with worsening performance status and worsening liver failure.  She has not tolerated chemotherapy well overall.  We have recommended focus on symptom management quality of life and consideration of hospice. -The patient and her husband are not interested in hospice at this time.  They request a second opinion.  We are attempting to arrange a second opinion with [facility] is a virtual visit. -Appreciate assistance of palliative care team and hospitalist.  Continue to address antiemetics and pain medication as needed.   LOS: 5 days   Mikey Bussing 02/22/20  Addendum  I have seen the patient, examined her. I agree with the assessment and and plan and have edited the notes.   Pt's nausea is better today and she was able to eat some food today. Still in quite bit pain. I asked her if she still want a second opinion, either through a virtual visit when she gets home, or by one of my partner. She states she will talk to her husband about this, she is not sure about this.   I appreciate the excellent care from the hospitalist and palliative care teams.  I will f/u on Monday.  Truitt Merle 02/22/2020

## 2020-02-23 DIAGNOSIS — C787 Secondary malignant neoplasm of liver and intrahepatic bile duct: Secondary | ICD-10-CM | POA: Diagnosis not present

## 2020-02-23 DIAGNOSIS — C259 Malignant neoplasm of pancreas, unspecified: Secondary | ICD-10-CM | POA: Diagnosis not present

## 2020-02-23 LAB — COMPREHENSIVE METABOLIC PANEL
ALT: 67 U/L — ABNORMAL HIGH (ref 0–44)
AST: 128 U/L — ABNORMAL HIGH (ref 15–41)
Albumin: 1.5 g/dL — ABNORMAL LOW (ref 3.5–5.0)
Alkaline Phosphatase: 301 U/L — ABNORMAL HIGH (ref 38–126)
Anion gap: 7 (ref 5–15)
BUN: <5 mg/dL — ABNORMAL LOW (ref 6–20)
CO2: 25 mmol/L (ref 22–32)
Calcium: 7.9 mg/dL — ABNORMAL LOW (ref 8.9–10.3)
Chloride: 104 mmol/L (ref 98–111)
Creatinine, Ser: 0.44 mg/dL (ref 0.44–1.00)
GFR, Estimated: >60 mL/min (ref >60)
Glucose, Bld: 93 mg/dL (ref 70–99)
Potassium: 2.9 mmol/L — ABNORMAL LOW (ref 3.5–5.1)
Sodium: 136 mmol/L (ref 135–145)
Total Bilirubin: 7.4 mg/dL — ABNORMAL HIGH (ref 0.3–1.2)
Total Protein: 6.5 g/dL (ref 6.5–8.1)

## 2020-02-23 LAB — CBC WITH DIFFERENTIAL/PLATELET
Abs Immature Granulocytes: 0.04 10*3/uL (ref 0.00–0.07)
Basophils Absolute: 0.1 10*3/uL (ref 0.0–0.1)
Basophils Relative: 1 %
Eosinophils Absolute: 0.1 10*3/uL (ref 0.0–0.5)
Eosinophils Relative: 2 %
HCT: 23.6 % — ABNORMAL LOW (ref 36.0–46.0)
Hemoglobin: 7.6 g/dL — ABNORMAL LOW (ref 12.0–15.0)
Immature Granulocytes: 1 %
Lymphocytes Relative: 16 %
Lymphs Abs: 1.3 10*3/uL (ref 0.7–4.0)
MCH: 31.8 pg (ref 26.0–34.0)
MCHC: 32.2 g/dL (ref 30.0–36.0)
MCV: 98.7 fL (ref 80.0–100.0)
Monocytes Absolute: 0.7 10*3/uL (ref 0.1–1.0)
Monocytes Relative: 8 %
Neutro Abs: 6 10*3/uL (ref 1.7–7.7)
Neutrophils Relative %: 72 %
Platelets: 174 10*3/uL (ref 150–400)
RBC: 2.39 MIL/uL — ABNORMAL LOW (ref 3.87–5.11)
RDW: 22.5 % — ABNORMAL HIGH (ref 11.5–15.5)
WBC: 8.2 10*3/uL (ref 4.0–10.5)
nRBC: 0.5 % — ABNORMAL HIGH (ref 0.0–0.2)

## 2020-02-23 LAB — MAGNESIUM: Magnesium: 1.6 mg/dL — ABNORMAL LOW (ref 1.7–2.4)

## 2020-02-23 LAB — GLUCOSE, CAPILLARY
Glucose-Capillary: 79 mg/dL (ref 70–99)
Glucose-Capillary: 93 mg/dL (ref 70–99)

## 2020-02-23 LAB — PHOSPHORUS: Phosphorus: 2.4 mg/dL — ABNORMAL LOW (ref 2.5–4.6)

## 2020-02-23 LAB — AMMONIA: Ammonia: 40 umol/L — ABNORMAL HIGH (ref 9–35)

## 2020-02-23 MED ORDER — POTASSIUM CHLORIDE CRYS ER 20 MEQ PO TBCR
40.0000 meq | EXTENDED_RELEASE_TABLET | ORAL | Status: AC
Start: 2020-02-23 — End: 2020-02-23
  Administered 2020-02-23 (×2): 40 meq via ORAL
  Filled 2020-02-23 (×2): qty 2

## 2020-02-23 MED ORDER — ALBUMIN HUMAN 25 % IV SOLN
25.0000 g | Freq: Once | INTRAVENOUS | Status: AC
  Administered 2020-02-23: 25 g via INTRAVENOUS
  Filled 2020-02-23: qty 100

## 2020-02-23 MED ORDER — LACTULOSE 10 GM/15ML PO SOLN
20.0000 g | Freq: Three times a day (TID) | ORAL | Status: DC
  Administered 2020-02-23 – 2020-03-01 (×19): 20 g via ORAL
  Filled 2020-02-23 (×21): qty 30

## 2020-02-23 MED ORDER — LACTULOSE 10 GM/15ML PO SOLN
20.0000 g | Freq: Two times a day (BID) | ORAL | Status: DC
  Administered 2020-02-23: 20 g via ORAL
  Filled 2020-02-23: qty 30

## 2020-02-23 MED ORDER — MAGNESIUM SULFATE 2 GM/50ML IV SOLN
2.0000 g | Freq: Once | INTRAVENOUS | Status: AC
  Administered 2020-02-23: 2 g via INTRAVENOUS
  Filled 2020-02-23: qty 50

## 2020-02-23 NOTE — Progress Notes (Signed)
Daily Progress Note   Patient Name: Cynthia Frye       Date: 02/23/2020 DOB: 09-22-1960  Age: 60 y.o. MRN#: [id number] Attending Physician: Elodia Florence., * Primary Care Physician: Josetta Huddle, MD Admit Date: 02/17/2020  Reason for Consultation/Follow-up: Establishing goals of care  Subjective: I saw and examined Cynthia Frye.  Her husband was also at the bedside.  He reports that been working to process all the information they received over the past couple of days.  We talked further about changes in her nutrition and functional status and how these are signs of significant disease progression.  I offered to review scans with them at any point if they think this will be helpful to understand what is changed since August of this year.  He states that knowing things have significantly progressed is enough information for him.  Length of Stay: 6  Current Medications: Scheduled Meds:  . Chlorhexidine Gluconate Cloth  6 each Topical Daily  . dronabinol  2.5 mg Oral BID AC  . enoxaparin  80 mg Subcutaneous Q12H  . feeding supplement  237 mL Oral BID BM  . mouth rinse  15 mL Mouth Rinse BID  . metoprolol succinate  50 mg Oral Daily  . potassium chloride  40 mEq Oral Q4H  . sodium chloride flush  10-40 mL Intracatheter Q12H  . sucralfate  1 g Oral TID WC & HS    Continuous Infusions: . ampicillin (OMNIPEN) IV 2 g (02/23/20 0448)  . lactated ringers 100 mL/hr at 02/23/20 0447  . magnesium sulfate bolus IVPB 2 g (02/23/20 0929)    PRN Meds: guaiFENesin-dextromethorphan, HYDROmorphone (DILAUDID) injection, lip balm, LORazepam, ondansetron (ZOFRAN) IV, prochlorperazine, sodium chloride flush, topiramate  Physical Exam        General: Alert, awake, in no acute distress.  Chronically ill appearing HEENT: No bruits, no goiter, no JVD Heart: Regular rate and rhythm. No murmur appreciated. Lungs: Good air movement, clear Abdomen: Soft, nontender, nondistended, positive bowel sounds.  Ext: No significant edema Skin: Warm and dry  Vital Signs: BP 120/82 (BP Location: Right Arm)   Pulse (!) 102   Temp 99.1 F (37.3 C) (Oral)   Resp 16   Ht '5\' 7"'  (1.702 m)   Wt 79.4 kg   SpO2 98%   BMI 27.42  kg/m  SpO2: SpO2: 98 % O2 Device: O2 Device: Room Air O2 Flow Rate:    Intake/output summary:   Intake/Output Summary (Last 24 hours) at 02/23/2020 1022 Last data filed at 02/23/2020 0456 Gross per 24 hour  Intake 2164.24 ml  Output 900 ml  Net 1264.24 ml   LBM: Last BM Date: 02/21/20 Baseline Weight: Weight: 79.4 kg Most recent weight: Weight: 79.4 kg       Palliative Assessment/Data:    Flowsheet Rows   Flowsheet Row Most Recent Value  Intake Tab   Referral Department Hospitalist  Unit at Time of Referral Med/Surg Unit  Palliative Care Primary Diagnosis Cancer  Date Notified 02/18/20  Palliative Care Type Return patient Palliative Care  Reason for referral Non-pain Symptom, Clarify Goals of Care  Date of Admission 02/17/20  Date first seen by Palliative Care 02/19/20  # of days Palliative referral response time 1 Day(s)  # of days IP prior to Palliative referral 1  Clinical Assessment   Palliative Performance Scale Score 30%  Psychosocial & Spiritual Assessment   Palliative Care Outcomes   Patient/Family meeting held? Yes  Who was at the meeting? patient, husband      Patient Active Problem List   Diagnosis Date Noted  . Pancreatic cancer metastasized to liver ([place])   . Pyelonephritis   . Severe sepsis ([place]) 02/17/2020  . COVID-19 virus infection 02/01/2020  . Sepsis ([place]) 02/01/2020  . Biliary obstruction 01/15/2020  . Abdominal pain 01/04/2020  . Dysphagia 12/10/2019  . Malnutrition of moderate degree 11/26/2019  . Anemia  associated with chemotherapy 11/25/2019  . Constipation 11/25/2019  . Generalized abdominal pain   . Nausea and vomiting in adult 11/24/2019  . Dehydration 11/24/2019  . Prolonged QT interval 11/24/2019  . Emesis   . Tachycardia   . Genetic testing 11/12/2019  . Colitis 11/04/2019  . Acute deep vein thrombosis of left iliac vein ([facility]) 11/04/2019  . Hypokalemia 11/04/2019  . Port-A-Cath in place 10/24/2019  . Goals of care, counseling/discussion 10/18/2019  . Pancreatic cancer ([place]) 10/02/2019  . Chronic migraine without aura without status migrainosus, not intractable 04/23/2019  . Migraine with aura and without status migrainosus, not intractable 04/23/2019  . Essential hypertension 04/29/2016  . Lupus (systemic lupus erythematosus) ([place]) 04/29/2016  . Asthma 04/29/2016  . Seasonal allergies 04/29/2016  . Abnormal stress test 04/09/2016    Palliative Care Assessment & Plan   Patient Profile: Cynthia Frye is a 60 year old female with metastatic pancreatic cancer, lupus, asthma, recent COVID-19 infection who presented to the hospital with continued abdominal pain, nausea, and failure to thrive.  CT demonstrates widespread cancer with heterogenous contrast uptake in the kidneys.  She is currently being treated with antibiotics for severe sepsis due to pyelonephritis.  Palliative consulted for goals of care.  Recommendations/Plan:  Full code/full scope  I met today with Cynthia Frye and her husband.  We talked about symptom management and she has no specific complaints today (just tired and weak overall).    Her husband had more questions today regarding nutrition moving forward.  I discussed with them that nutrition is going to be a big factor moving forward and that there is not a good option available for long-term supplementation.  Continue to encourage small frequent meals/bites/sips as she can tolerate.  Both Cynthia Frye and her husband are still working to process where is at in  her disease process.  They would like to have a second opinion via virtual visit.  Dr.  Burr Frye is aware and working to set this up.  Palliative to follow-up this weekend.   Goals of Care and Additional Recommendations:  Limitations on Scope of Treatment: Full Scope Treatment  Code Status: .    Code Status Orders  (From admission, onward)         Start     Ordered   02/17/20 2319  Full code  Continuous        02/17/20 2318        Code Status History    Date Active Date Inactive Code Status Order ID Comments User Context   02/01/2020 0919 02/07/2020 2350 Full Code [id number]  Jonnie Finner, DO ED   01/15/2020 2037 01/18/2020 1841 Full Code [id number]  Elwyn Reach, MD ED   11/24/2019 1908 12/12/2019 1944 Full Code [id number]  Eugenie Filler, MD Inpatient   11/04/2019 2125 11/09/2019 2207 Full Code [id number]  Rise Patience, MD ED   04/09/2016 1319 04/09/2016 1952 Full Code [id number]  End, Harrell Gave, MD Inpatient   Advance Care Planning Activity       Prognosis:   < 6 months  Discharge Planning:  To Be Determined- Recommend home with hospice  Care plan was discussed with patient, husband  Thank you for allowing the Palliative Medicine Team to assist in the care of this patient.   Time In: 1800 Time Out: 1830 Total Time 30 Prolonged Time Billed No   Greater than 50%  of this time was spent counseling and coordinating care related to the above assessment and plan.  Micheline Rough, MD  Please contact Palliative Medicine Team phone at [phone] for questions and concerns.

## 2020-02-23 NOTE — Progress Notes (Addendum)
PROGRESS NOTE    Cynthia Frye  WUJ:[id number] DOB: 1960-02-08 DOA: 02/17/2020 PCP: Josetta Huddle, MD   Chief Complaint  Patient presents with  . Weakness  . Dehydration   Brief Narrative:  [name] a59 y.o.female,with medical history of pancreatic cancer s/p chemotherapy, lupus, asthma, recent COVID-19 infection,which was treated with remdesivir in the hospital for 3 days during an admission for sepsis of unclear origin who has continued to have abdominal pain, nausea, and failure to thrive at home prompting return to the ED 1/23. She was tachycardic, tachypneic with CT abd/pelvis demonstrating known widespread cancer and mild heterogenous contrast uptake in kidneys bilaterally. She was given broad spectrum antibiotics and sepsis-dose IV fluids and admitted to SDU.  Assessment & Plan:   Active Problems:   Severe sepsis [facility])   Pancreatic cancer metastasized to liver [facility])   Pyelonephritis  Goals of care discussion: The patient has advanced cancer which has progressed despite treatment (which she has tolerated poorly), now no longer on treatment of any kind. Hospice has been recommended.  - goals of care meeting this AM with Dr. Burr Medico and Dr. Domingo Cocking, appreciate their assistance -> recommended focus on symptom management and quality of like - consider hospice acare at home after discharge - they asked for 2nd opinion, which Dr. Burr Medico going to look into - appreciate palliative care assistance with symptom management  Acute Metabolic Encephalopathy  Hepatic Encephalopathy: continued sluggishness, slow to respond today.  Ammonia slightly elevated today, she has asterixis on exam.  Suspect this is hepatic encephalopathy due to her progressive liver failure.  Meds could also contribute, but she still complains of pain.  Lactulose, continue to monitor   Hypoalbuminemia  Anasarca: will give albumin, continue to monitor.    Severe sepsis due to pyelonephritis: Diagnosed at  admission based on tachycardia, tachypnea, abnormalities of kidneys on CT abdomen.  - Febrile 1/24. Urine culture growing Enterococcus faecalis > susceptibility to ampicillin (1/25 - present - currently on day 6 when including vanc) plan for 10 days - Follow blood cultures, repeat cultures now since febrile.  Recent covid-19 infection: +SARS-CoV-2 testing 1/7. s/p Pfizer July, Aug 2021. Treated with some remdesivir, never hypoxic to require steroids. Has completed 10 days of isolation.  - DC isolation.  Acute left CFV DVT, chronic right-sided pulmonary emboli: Left iliac vein DVT (10/2019), DVT in the left peroneal and gastrocnemius veins (01/07/2020), DVT in the left common femoral vein (02/01/2020) - Lovenox 1mg /kg q12h  Hyponatremia:  - d/c LR in setting of anasarca  Hypokalemia: replace and follow  Nausea, vomiting, intolerance to enteral feeding, GERD, severe protein calorie malnutrition: Prealbumin undetectable at recent hospitalization. - Maximize antiemetics, hold TPN for now.   Metastatic pancreatic cancer, elevated LFTs: Progressive despite chemotherapy which is also poorly tolerated. No longer on chemotherapy, [place] conversations as noted above.  - bilirubin rising, 7.4 today, continue to follow (no intrahepatic or extrahepatic ductal dilatation on 1/23 CT scan) -> she has likely hepatic encephalopathy, progressive liver failure - prognosis is poor - discussed with Dr. Burr Medico - likely 2/2 metastatic pancreatic cancer - Palliative care consulted  Pancytopenia, anemia of malignancy:  - fluctuating Hb, continue to monitor - 7.6 today - transfuse for <7  Thrombocytopenia: resolved  SLE: No flare. Hold home meds for now  HTN:  - Restart metoprolol. Hold other home meds with softer BPs on admit. Restart when rising BP.  DVT prophylaxis: lovenox Code Status: full  Family Communication: called husband, no answer 1/28 Disposition:  Status is: Inpatient  Remains  inpatient appropriate because:Inpatient level of care appropriate due to severity of illness   Dispo: The patient is from: Home              Anticipated d/c is to: Home              Anticipated d/c date is: > 3 days              Patient currently is not medically stable to d/c.   Difficult to place patient No       Consultants:   Palliative care  oncology  Procedures:  none  Antimicrobials:  Anti-infectives (From admission, onward)   Start     Dose/Rate Route Frequency Ordered Stop   02/19/20 1315  ampicillin (OMNIPEN) 2 g in sodium chloride 0.9 % 100 mL IVPB        2 g 300 mL/hr over 20 Minutes Intravenous Every 6 hours 02/19/20 1218     02/18/20 1400  cefTRIAXone (ROCEPHIN) 2 g in sodium chloride 0.9 % 100 mL IVPB  Status:  Discontinued        2 g 200 mL/hr over 30 Minutes Intravenous Every 24 hours 02/18/20 0644 02/19/20 1218   02/18/20 0200  vancomycin (VANCOCIN) IVPB 1000 mg/200 mL premix  Status:  Discontinued        1,000 mg 200 mL/hr over 60 Minutes Intravenous Every 12 hours 02/17/20 1847 02/18/20 0644   02/17/20 2100  ceFEPIme (MAXIPIME) 2 g in sodium chloride 0.9 % 100 mL IVPB  Status:  Discontinued        2 g 200 mL/hr over 30 Minutes Intravenous Every 8 hours 02/17/20 1841 02/18/20 0644   02/17/20 1400  vancomycin (VANCOREADY) IVPB 2000 mg/400 mL        2,000 mg 200 mL/hr over 120 Minutes Intravenous  Once 02/17/20 1308 02/17/20 2011   02/17/20 1300  ceFEPIme (MAXIPIME) 2 g in sodium chloride 0.9 % 100 mL IVPB        2 g 200 mL/hr over 30 Minutes Intravenous  Once 02/17/20 1251 02/17/20 1350   02/17/20 1300  metroNIDAZOLE (FLAGYL) IVPB 500 mg        500 mg 100 mL/hr over 60 Minutes Intravenous  Once 02/17/20 1251 02/17/20 1502      Subjective: Sluggish again today, husband notes concern about her mental status  Objective: Vitals:   02/21/20 2025 02/22/20 0318 02/22/20 2122 02/23/20 0456  BP: 134/89 121/86 122/85 120/82  Pulse: (!) 109 (!) 110 99  (!) 102  Resp: 18 15 17 16   Temp: 99.5 F (37.5 C) 98.2 F (36.8 C) 98.2 F (36.8 C) 99.1 F (37.3 C)  TempSrc: Oral Oral Oral Oral  SpO2: 99% 99% 100% 98%  Weight:      Height:        Intake/Output Summary (Last 24 hours) at 02/23/2020 1348 Last data filed at 02/23/2020 1000 Gross per 24 hour  Intake 2284.24 ml  Output 900 ml  Net 1384.24 ml   Filed Weights   02/17/20 2345  Weight: 79.4 kg    Examination:  General: No acute distress. Cardiovascular: RRR Lungs: unlabored Abdomen: Soft, nontender, nondistended  Neurological: Sluggish, slow to respond and answer questions + asterixis. Moves all extremities 4 with equal strength. Cranial nerves II through XII grossly intact.  Skin: Warm and dry. No rashes or lesions. Extremities: No clubbing or cyanosis. Diffuse edema.    Data Reviewed: I have personally reviewed following labs and  imaging studies  CBC: Recent Labs  Lab 02/19/20 1438 02/20/20 0500 02/21/20 0453 02/22/20 0549 02/23/20 0435  WBC 6.8 6.8 7.4 8.3 8.2  NEUTROABS 4.9 4.6 5.0 6.1 6.0  HGB 8.7* 8.0* 7.7* 7.7* 7.6*  HCT 27.1* 24.7* 24.2* 23.9* 23.6*  MCV 99.3 97.2 99.2 98.4 98.7  PLT 157 151 157 176 [id number]    Basic Metabolic Panel: Recent Labs  Lab 02/19/20 1438 02/20/20 0500 02/21/20 0453 02/22/20 0549 02/23/20 0435  NA 133* 133* 134* 133* 136  K 3.5 3.1* 3.4* 3.3* 2.9*  CL 102 101 103 100 104  CO2 23 22 23 22 25   GLUCOSE 86 78 76 89 93  BUN 8 8 8 7  <5*  CREATININE 0.50 0.48 0.46 0.47 0.44  CALCIUM 8.5* 8.3* 8.1* 8.0* 7.9*  MG  --   --  1.5* 1.6* 1.6*  PHOS  --   --  2.4* 3.1 2.4*    GFR: Estimated Creatinine Clearance: 83.1 mL/min (by C-G formula based on SCr of 0.44 mg/dL).  Liver Function Tests: Recent Labs  Lab 02/19/20 1438 02/20/20 0500 02/21/20 0453 02/22/20 0549 02/23/20 0435  AST 157* 147* 142* 137* 128*  ALT 81* 76* 73* 73* 67*  ALKPHOS 311* 336* 336* 353* 301*  BILITOT 5.8* 6.3* 6.8* 7.6* 7.4*  PROT 6.9 6.7 6.4*  7.0 6.5  ALBUMIN 1.8* 1.7* 1.6* 1.7* 1.5*    CBG: Recent Labs  Lab 02/18/20 0540 02/23/20 0012 02/23/20 0457  GLUCAP 71 79 93     Recent Results (from the past 240 hour(s))  Blood Culture (routine x 2)     Status: None   Collection Time: 02/17/20  1:00 PM   Specimen: BLOOD  Result Value Ref Range Status   Specimen Description   Final    BLOOD SITE NOT SPECIFIED Performed at [facility], [address] [place], [place] [address]    Special Requests   Final    BOTTLES DRAWN AEROBIC AND ANAEROBIC Blood Culture adequate volume Performed at [facility] [address] [place], [place] [address]    Culture   Final    NO GROWTH 5 DAYS Performed at [facility], [place] [address], [place], [place] [address]    Report Status 02/22/2020 FINAL  Final  Urine culture     Status: Abnormal   Collection Time: 02/17/20  8:20 PM   Specimen: In/Out Cath Urine  Result Value Ref Range Status   Specimen Description   Final    IN/OUT CATH URINE Performed at [facility] [address] [place], [place] [address]    Special Requests   Final    NONE Performed at [facility], [place] [address], [place], [place] [address]    Culture 40,000 COLONIES/mL ENTEROCOCCUS FAECALIS (Elzena Muston)  Final   Report Status 02/20/2020 FINAL  Final   Organism ID, Bacteria ENTEROCOCCUS FAECALIS (Monie Shere)  Final      Susceptibility   Enterococcus faecalis - MIC*    AMPICILLIN <=2 SENSITIVE Sensitive     NITROFURANTOIN <=16 SENSITIVE Sensitive     VANCOMYCIN 1 SENSITIVE Sensitive     * 40,000 COLONIES/mL ENTEROCOCCUS FAECALIS  MRSA PCR Screening     Status: None   Collection Time: 02/17/20 11:18 PM   Specimen: Nasal Mucosa; Nasopharyngeal  Result Value Ref Range Status   MRSA by PCR NEGATIVE NEGATIVE Final    Comment:        The GeneXpert MRSA Assay (FDA approved  for NASAL specimens only), is one component of [facility] Horn[facility] comprehensive MRSA  colonization surveillance program. It is not intended to diagnose MRSA infection nor to guide or monitor treatment for MRSA infections. Performed at [facility], [place] [address], [place], [place] [address]   Culture, blood (routine x 2)     Status: None (Preliminary result)   Collection Time: 02/19/20  1:27 PM   Specimen: BLOOD  Result Value Ref Range Status   Specimen Description   Final    BLOOD RIGHT ANTECUBITAL Performed at [facility] [address] [place], [place] [address]    Special Requests   Final    BOTTLES DRAWN AEROBIC ONLY Blood Culture results may not be optimal due to an inadequate volume of blood received in culture bottles Performed at [facility] [address] [place], [place] [address]    Culture   Final    NO GROWTH 3 DAYS Performed at [facility], [place] [address], [place], [place] [address]    Report Status PENDING  Incomplete  Culture, blood (routine x 2)     Status: None (Preliminary result)   Collection Time: 02/19/20  1:34 PM   Specimen: BLOOD RIGHT HAND  Result Value Ref Range Status   Specimen Description   Final    BLOOD RIGHT HAND Performed at [facility] [address] [place], [place] [address]    Special Requests   Final    BOTTLES DRAWN AEROBIC ONLY Blood Culture results may not be optimal due to an inadequate volume of blood received in culture bottles Performed at [facility] [address], [place], [place] [address]    Culture   Final    NO GROWTH 3 DAYS Performed at [facility], [place] [address], [place], [place] [address]    Report Status PENDING  Incomplete         Radiology Studies: No results found.      Scheduled Meds: . Chlorhexidine Gluconate Cloth  6 each Topical Daily  . dronabinol  2.5 mg Oral BID AC  . enoxaparin  80 mg Subcutaneous Q12H  . feeding supplement  237 mL Oral BID BM  . lactulose   20 g Oral BID  . mouth rinse  15 mL Mouth Rinse BID  . metoprolol succinate  50 mg Oral Daily  . sodium chloride flush  10-40 mL Intracatheter Q12H  . sucralfate  1 g Oral TID WC & HS   Continuous Infusions: . ampicillin (OMNIPEN) IV 2 g (02/23/20 1331)  . lactated ringers 100 mL/hr at 02/23/20 0447     LOS: 6 days    Time spent: over 30 min    Fayrene Helper, MD [facility]   To contact the attending provider between 7A-7P or the covering provider during after hours 7P-7A, please log into the web site [email or web address] and access using universal [place] password for that web site. If you do not have the password, please call the hospital operator.  02/23/2020, 1:48 PM

## 2020-02-24 ENCOUNTER — Inpatient Hospital Stay (HOSPITAL_COMMUNITY): Payer: BC Managed Care – PPO

## 2020-02-24 DIAGNOSIS — Z7189 Other specified counseling: Secondary | ICD-10-CM | POA: Diagnosis not present

## 2020-02-24 DIAGNOSIS — Z515 Encounter for palliative care: Secondary | ICD-10-CM | POA: Diagnosis not present

## 2020-02-24 DIAGNOSIS — R531 Weakness: Secondary | ICD-10-CM | POA: Diagnosis not present

## 2020-02-24 DIAGNOSIS — C259 Malignant neoplasm of pancreas, unspecified: Secondary | ICD-10-CM | POA: Diagnosis not present

## 2020-02-24 DIAGNOSIS — N12 Tubulo-interstitial nephritis, not specified as acute or chronic: Secondary | ICD-10-CM | POA: Diagnosis not present

## 2020-02-24 DIAGNOSIS — C787 Secondary malignant neoplasm of liver and intrahepatic bile duct: Secondary | ICD-10-CM | POA: Diagnosis not present

## 2020-02-24 LAB — PROTIME-INR
INR: 1.9 — ABNORMAL HIGH (ref 0.8–1.2)
Prothrombin Time: 20.8 seconds — ABNORMAL HIGH (ref 11.4–15.2)

## 2020-02-24 LAB — COMPREHENSIVE METABOLIC PANEL
ALT: 65 U/L — ABNORMAL HIGH (ref 0–44)
AST: 131 U/L — ABNORMAL HIGH (ref 15–41)
Albumin: 2.1 g/dL — ABNORMAL LOW (ref 3.5–5.0)
Alkaline Phosphatase: 269 U/L — ABNORMAL HIGH (ref 38–126)
Anion gap: 10 (ref 5–15)
BUN: <5 mg/dL — ABNORMAL LOW (ref 6–20)
CO2: 24 mmol/L (ref 22–32)
Calcium: 8.2 mg/dL — ABNORMAL LOW (ref 8.9–10.3)
Chloride: 101 mmol/L (ref 98–111)
Creatinine, Ser: 0.42 mg/dL — ABNORMAL LOW (ref 0.44–1.00)
GFR, Estimated: >60 mL/min (ref >60)
Glucose, Bld: 93 mg/dL (ref 70–99)
Potassium: 3 mmol/L — ABNORMAL LOW (ref 3.5–5.1)
Sodium: 135 mmol/L (ref 135–145)
Total Bilirubin: 8.8 mg/dL — ABNORMAL HIGH (ref 0.3–1.2)
Total Protein: 6.9 g/dL (ref 6.5–8.1)

## 2020-02-24 LAB — CBC WITH DIFFERENTIAL/PLATELET
Abs Immature Granulocytes: 0.04 10*3/uL (ref 0.00–0.07)
Basophils Absolute: 0.1 10*3/uL (ref 0.0–0.1)
Basophils Relative: 1 %
Eosinophils Absolute: 0.1 10*3/uL (ref 0.0–0.5)
Eosinophils Relative: 1 %
HCT: 24.2 % — ABNORMAL LOW (ref 36.0–46.0)
Hemoglobin: 8 g/dL — ABNORMAL LOW (ref 12.0–15.0)
Immature Granulocytes: 0 %
Lymphocytes Relative: 13 %
Lymphs Abs: 1.2 10*3/uL (ref 0.7–4.0)
MCH: 33.3 pg (ref 26.0–34.0)
MCHC: 33.1 g/dL (ref 30.0–36.0)
MCV: 100.8 fL — ABNORMAL HIGH (ref 80.0–100.0)
Monocytes Absolute: 0.7 10*3/uL (ref 0.1–1.0)
Monocytes Relative: 8 %
Neutro Abs: 6.9 10*3/uL (ref 1.7–7.7)
Neutrophils Relative %: 77 %
Platelets: 185 10*3/uL (ref 150–400)
RBC: 2.4 MIL/uL — ABNORMAL LOW (ref 3.87–5.11)
RDW: 22.4 % — ABNORMAL HIGH (ref 11.5–15.5)
WBC: 9 10*3/uL (ref 4.0–10.5)
nRBC: 0.3 % — ABNORMAL HIGH (ref 0.0–0.2)

## 2020-02-24 LAB — VITAMIN B12: Vitamin B-12: 4009 pg/mL — ABNORMAL HIGH (ref 180–914)

## 2020-02-24 LAB — CULTURE, BLOOD (ROUTINE X 2)
Culture: NO GROWTH
Culture: NO GROWTH

## 2020-02-24 LAB — MAGNESIUM: Magnesium: 1.7 mg/dL (ref 1.7–2.4)

## 2020-02-24 LAB — AMMONIA: Ammonia: 33 umol/L (ref 9–35)

## 2020-02-24 LAB — TSH: TSH: 2.261 u[IU]/mL (ref 0.350–4.500)

## 2020-02-24 LAB — PHOSPHORUS: Phosphorus: 2.4 mg/dL — ABNORMAL LOW (ref 2.5–4.6)

## 2020-02-24 LAB — FOLATE: Folate: 10.2 ng/mL (ref >5.9)

## 2020-02-24 MED ORDER — POTASSIUM CHLORIDE CRYS ER 20 MEQ PO TBCR
40.0000 meq | EXTENDED_RELEASE_TABLET | ORAL | Status: AC
  Administered 2020-02-24 (×2): 40 meq via ORAL
  Filled 2020-02-24 (×2): qty 2

## 2020-02-24 NOTE — Progress Notes (Signed)
PROGRESS NOTE    Cynthia Frye  PJK:[id number] DOB: [birth year] DOA: 02/17/2020 PCP: Josetta Huddle, MD   Chief Complaint  Patient presents with  . Weakness  . Dehydration   Brief Narrative:  Cynthia Frye a59 y.o.female,with medical history of pancreatic cancer s/p chemotherapy, lupus, asthma, recent COVID-19 infection,which was treated with remdesivir in the hospital for 3 days during an admission for sepsis of unclear origin who has continued to have abdominal pain, nausea, and failure to thrive at home prompting return to the ED 1/23. She was tachycardic, tachypneic with CT abd/pelvis demonstrating known widespread cancer and mild heterogenous contrast uptake in kidneys bilaterally. She was given broad spectrum antibiotics and sepsis-dose IV fluids and admitted to SDU.  Assessment & Plan:   Active Problems:   Severe sepsis [facility])   Pancreatic cancer metastasized to liver [facility])   Pyelonephritis  Goals of care discussion: The patient has advanced cancer which has progressed despite treatment (which she has tolerated poorly), now no longer on treatment of any kind. Hospice has been recommended.  - goals of care meeting this AM with Dr. Burr Medico and Dr. Domingo Cocking, appreciate their assistance -> recommended focus on symptom management and quality of like - consider hospice acare at home after discharge - they asked for 2nd opinion, which Dr. Burr Medico going to look into - appreciate palliative care assistance with symptom management - appears that she's rapidly declining, I doubt she'll be able to d/c home at this point as they initially planned.  Discussed with pt and husband today (husband seems to understand, pt seems to still have some denial when she's engaged).  Acute Metabolic Encephalopathy  Hepatic Encephalopathy: continued sluggishness, slow to respond today, but Cynthia Frye&Ox3.  Ammonia improved, asterixis is improved today.  Suspect this is hepatic encephalopathy due to her progressive liver  failure.  Meds could also contribute, but she still complains of pain.  Lactulose, continue to monitor   Hypoalbuminemia  Anasarca: will give albumin, continue to monitor.   Follow ascites search - consider therapeutic para if significant given discomfort  Severe sepsis due to pyelonephritis: Diagnosed at admission based on tachycardia, tachypnea, abnormalities of kidneys on CT abdomen.  - Febrile 1/24. Urine culture growing Enterococcus faecalis > susceptibility to ampicillin (1/25 - present - currently on day 8 when including vanc - discussed with pharmacy) plan for 10 days - Follow blood cultures, repeat cultures now since febrile.  Recent covid-19 infection: +SARS-CoV-2 testing 1/7. s/p Pfizer July, Aug 2021. Treated with some remdesivir, never hypoxic to require steroids. Has completed 10 days of isolation.  - DC isolation.  Acute left CFV DVT, chronic right-sided pulmonary emboli: Left iliac vein DVT (10/2019), DVT in the left peroneal and gastrocnemius veins (01/07/2020), DVT in the left common femoral vein (02/01/2020) - Lovenox 1mg /kg q12h  Hyponatremia:  - d/c LR in setting of anasarca  Hypokalemia: replace and follow  Nausea, vomiting, intolerance to enteral feeding, GERD, severe protein calorie malnutrition: Prealbumin undetectable at recent hospitalization. - Maximize antiemetics, hold TPN for now.   Metastatic pancreatic cancer, elevated LFTs  Liver Failure: Progressive despite chemotherapy which is also poorly tolerated. No longer on chemotherapy, [place] conversations as noted above.  - bilirubin rising, 8.8 today, INR is 1.9.  AST/ALT relatively stable. continue to follow (no intrahepatic or extrahepatic ductal dilatation on 1/23 CT scan) -> she has likely hepatic encephalopathy, progressive liver failure - prognosis is poor - discussed with Dr. Burr Medico - likely 2/2 metastatic pancreatic cancer - Palliative care consulted  Pancytopenia,  anemia of malignancy:  -  fluctuating Hb, continue to monitor - 8 today - transfuse for <7  Thrombocytopenia: resolved  SLE: No flare. Hold home meds for now  HTN:  - Restart metoprolol. Hold other home meds with softer BPs on admit. Restart when rising BP.  DVT prophylaxis: lovenox Code Status: full  Family Communication: called husband, no answer 1/28 Disposition:   Status is: Inpatient  Remains inpatient appropriate because:Inpatient level of care appropriate due to severity of illness   Dispo: The patient is from: Home              Anticipated d/c is to: Home              Anticipated d/c date is: > 3 days              Patient currently is not medically stable to d/c.   Difficult to place patient No       Consultants:   Palliative care  oncology  Procedures:  none  Antimicrobials:  Anti-infectives (From admission, onward)   Start     Dose/Rate Route Frequency Ordered Stop   02/19/20 1315  ampicillin (OMNIPEN) 2 g in sodium chloride 0.9 % 100 mL IVPB        2 g 300 mL/hr over 20 Minutes Intravenous Every 6 hours 02/19/20 1218     02/18/20 1400  cefTRIAXone (ROCEPHIN) 2 g in sodium chloride 0.9 % 100 mL IVPB  Status:  Discontinued        2 g 200 mL/hr over 30 Minutes Intravenous Every 24 hours 02/18/20 0644 02/19/20 1218   02/18/20 0200  vancomycin (VANCOCIN) IVPB 1000 mg/200 mL premix  Status:  Discontinued        1,000 mg 200 mL/hr over 60 Minutes Intravenous Every 12 hours 02/17/20 1847 02/18/20 0644   02/17/20 2100  ceFEPIme (MAXIPIME) 2 g in sodium chloride 0.9 % 100 mL IVPB  Status:  Discontinued        2 g 200 mL/hr over 30 Minutes Intravenous Every 8 hours 02/17/20 1841 02/18/20 0644   02/17/20 1400  vancomycin (VANCOREADY) IVPB 2000 mg/400 mL        2,000 mg 200 mL/hr over 120 Minutes Intravenous  Once 02/17/20 1308 02/17/20 2011   02/17/20 1300  ceFEPIme (MAXIPIME) 2 g in sodium chloride 0.9 % 100 mL IVPB        2 g 200 mL/hr over 30 Minutes Intravenous  Once 02/17/20  1251 02/17/20 1350   02/17/20 1300  metroNIDAZOLE (FLAGYL) IVPB 500 mg        500 mg 100 mL/hr over 60 Minutes Intravenous  Once 02/17/20 1251 02/17/20 1502      Subjective: Sluggish, but Cynthia Frye&Ox3 Says she couldn't sleep last night emesis  Objective: Vitals:   02/24/20 0656 02/24/20 0720 02/24/20 0905 02/24/20 1102  BP: (!) 138/97  (!) 136/91 (!) 142/91  Pulse:  (!) 118 (!) 109 (!) 104  Resp: 16  18 20   Temp: 98.7 F (37.1 C)  98.8 F (37.1 C) 98.2 F (36.8 C)  TempSrc: Oral  Oral Oral  SpO2: 100%  99% 99%  Weight:      Height:        Intake/Output Summary (Last 24 hours) at 02/24/2020 1310 Last data filed at 02/24/2020 1000 Gross per 24 hour  Intake 1093.14 ml  Output --  Net 1093.14 ml   Filed Weights   02/17/20 2345  Weight: 79.4 kg    Examination:  General: No acute distress. Cardiovascular: RRR Lungs: unlabored Abdomen: Soft, nontender, mildly increased distension  Neurological: Alert and oriented 3. Moves all extremities 4. Cranial nerves II through XII grossly intact. Skin: Warm and dry. No rashes or lesions. Extremities: anasarca    Data Reviewed: I have personally reviewed following labs and imaging studies  CBC: Recent Labs  Lab 02/20/20 0500 02/21/20 0453 02/22/20 0549 02/23/20 0435 02/24/20 0335  WBC 6.8 7.4 8.3 8.2 9.0  NEUTROABS 4.6 5.0 6.1 6.0 6.9  HGB 8.0* 7.7* 7.7* 7.6* 8.0*  HCT 24.7* 24.2* 23.9* 23.6* 24.2*  MCV 97.2 99.2 98.4 98.7 100.8*  PLT 151 157 176 174 595    Basic Metabolic Panel: Recent Labs  Lab 02/20/20 0500 02/21/20 0453 02/22/20 0549 02/23/20 0435 02/24/20 0335  NA 133* 134* 133* 136 135  K 3.1* 3.4* 3.3* 2.9* 3.0*  CL 101 103 100 104 101  CO2 22 23 22 25 24   GLUCOSE 78 76 89 93 93  BUN 8 8 7  <5* <5*  CREATININE 0.48 0.46 0.47 0.44 0.42*  CALCIUM 8.3* 8.1* 8.0* 7.9* 8.2*  MG  --  1.5* 1.6* 1.6* 1.7  PHOS  --  2.4* 3.1 2.4* 2.4*    GFR: Estimated Creatinine Clearance: 83.1 mL/min (Cynthia Frye) (by C-G formula  based on SCr of 0.42 mg/dL (L)).  Liver Function Tests: Recent Labs  Lab 02/20/20 0500 02/21/20 0453 02/22/20 0549 02/23/20 0435 02/24/20 0335  AST 147* 142* 137* 128* 131*  ALT 76* 73* 73* 67* 65*  ALKPHOS 336* 336* 353* 301* 269*  BILITOT 6.3* 6.8* 7.6* 7.4* 8.8*  PROT 6.7 6.4* 7.0 6.5 6.9  ALBUMIN 1.7* 1.6* 1.7* 1.5* 2.1*    CBG: Recent Labs  Lab 02/18/20 0540 02/23/20 0012 02/23/20 0457  GLUCAP 71 79 93     Recent Results (from the past 240 hour(s))  Blood Culture (routine x 2)     Status: None   Collection Time: 02/17/20  1:00 PM   Specimen: BLOOD  Result Value Ref Range Status   Specimen Description   Final    BLOOD SITE NOT SPECIFIED Performed at [facility], [place] [address], [place], [place] [address]    Special Requests   Final    BOTTLES DRAWN AEROBIC AND ANAEROBIC Blood Culture adequate volume Performed at [place] [address] [place], [place] [address]    Culture   Final    NO GROWTH 5 DAYS Performed at [facility], [place] [address], [place], [place] [address]    Report Status 02/22/2020 FINAL  Final  Urine culture     Status: Abnormal   Collection Time: 02/17/20  8:20 PM   Specimen: In/Out Cath Urine  Result Value Ref Range Status   Specimen Description   Final    IN/OUT CATH URINE Performed at [place] [address] [place], [place] [address]    Special Requests   Final    NONE Performed at [facility], [place] [address], [place], [place] [address]    Culture 40,000 COLONIES/mL ENTEROCOCCUS FAECALIS (Cynthia Frye)  Final   Report Status 02/20/2020 FINAL  Final   Organism ID, Bacteria ENTEROCOCCUS FAECALIS (Cynthia Frye)  Final      Susceptibility   Enterococcus faecalis - MIC*    AMPICILLIN <=2 SENSITIVE Sensitive     NITROFURANTOIN <=16 SENSITIVE Sensitive     VANCOMYCIN 1 SENSITIVE Sensitive     * 40,000 COLONIES/mL ENTEROCOCCUS FAECALIS  MRSA PCR  Screening     Status: None    Collection Time: 02/17/20 11:18 PM   Specimen: Nasal Mucosa; Nasopharyngeal  Result Value Ref Range Status   MRSA by PCR NEGATIVE NEGATIVE Final    Comment:        The GeneXpert MRSA Assay (FDA approved for NASAL specimens only), is one component of Cynthia Frye comprehensive MRSA colonization surveillance program. It is not intended to diagnose MRSA infection nor to guide or monitor treatment for MRSA infections. Performed at [facility], [place] [address], [place], [place] [address]   Culture, blood (routine x 2)     Status: None   Collection Time: 02/19/20  1:27 PM   Specimen: BLOOD  Result Value Ref Range Status   Specimen Description   Final    BLOOD RIGHT ANTECUBITAL Performed at [place] [address], [place], [place] [address]    Special Requests   Final    BOTTLES DRAWN AEROBIC ONLY Blood Culture results may not be optimal due to an inadequate volume of blood received in culture bottles Performed at [place] [address] [place], [place] [address]    Culture   Final    NO GROWTH 5 DAYS Performed at [facility], [place] [address], [place], [place] [address]    Report Status 02/24/2020 FINAL  Final  Culture, blood (routine x 2)     Status: None   Collection Time: 02/19/20  1:34 PM   Specimen: BLOOD RIGHT HAND  Result Value Ref Range Status   Specimen Description   Final    BLOOD RIGHT HAND Performed at [place] [address], [place], [place] [address]    Special Requests   Final    BOTTLES DRAWN AEROBIC ONLY Blood Culture results may not be optimal due to an inadequate volume of blood received in culture bottles Performed at [place] [address] [place], [place] [address]    Culture   Final    NO GROWTH 5 DAYS Performed at [facility], [place] [address], [place], [place] [address]    Report Status 02/24/2020 FINAL  Final          Radiology Studies: No results found.      Scheduled Meds: . Chlorhexidine Gluconate Cloth  6 each Topical Daily  . dronabinol  2.5 mg Oral BID AC  . enoxaparin  80 mg Subcutaneous Q12H  . feeding supplement  237 mL Oral BID BM  . lactulose  20 g Oral TID  . mouth rinse  15 mL Mouth Rinse BID  . metoprolol succinate  50 mg Oral Daily  . potassium chloride  40 mEq Oral Q4H  . sodium chloride flush  10-40 mL Intracatheter Q12H  . sucralfate  1 g Oral TID WC & HS   Continuous Infusions: . ampicillin (OMNIPEN) IV 2 g (02/24/20 1158)     LOS: 7 days    Time spent: over 30 min    Fayrene Helper, MD [facility]   To contact the attending provider between 7A-7P or the covering provider during after hours 7P-7A, please log into the web site [email or web address] and access using universal [place] password for that web site. If you do not have the password, please call the hospital operator.  02/24/2020, 1:10 PM

## 2020-02-25 ENCOUNTER — Encounter (HOSPITAL_COMMUNITY): Payer: Self-pay | Admitting: Hematology

## 2020-02-25 DIAGNOSIS — Z7189 Other specified counseling: Secondary | ICD-10-CM | POA: Diagnosis not present

## 2020-02-25 DIAGNOSIS — N12 Tubulo-interstitial nephritis, not specified as acute or chronic: Secondary | ICD-10-CM | POA: Diagnosis not present

## 2020-02-25 DIAGNOSIS — Z515 Encounter for palliative care: Secondary | ICD-10-CM | POA: Diagnosis not present

## 2020-02-25 DIAGNOSIS — C787 Secondary malignant neoplasm of liver and intrahepatic bile duct: Secondary | ICD-10-CM | POA: Diagnosis not present

## 2020-02-25 DIAGNOSIS — A419 Sepsis, unspecified organism: Secondary | ICD-10-CM | POA: Diagnosis not present

## 2020-02-25 DIAGNOSIS — C259 Malignant neoplasm of pancreas, unspecified: Secondary | ICD-10-CM | POA: Diagnosis not present

## 2020-02-25 LAB — CBC WITH DIFFERENTIAL/PLATELET
Abs Immature Granulocytes: 0.06 10*3/uL (ref 0.00–0.07)
Basophils Absolute: 0.1 10*3/uL (ref 0.0–0.1)
Basophils Relative: 1 %
Eosinophils Absolute: 0.1 10*3/uL (ref 0.0–0.5)
Eosinophils Relative: 1 %
HCT: 25.4 % — ABNORMAL LOW (ref 36.0–46.0)
Hemoglobin: 8.1 g/dL — ABNORMAL LOW (ref 12.0–15.0)
Immature Granulocytes: 1 %
Lymphocytes Relative: 16 %
Lymphs Abs: 1.7 10*3/uL (ref 0.7–4.0)
MCH: 32.5 pg (ref 26.0–34.0)
MCHC: 31.9 g/dL (ref 30.0–36.0)
MCV: 102 fL — ABNORMAL HIGH (ref 80.0–100.0)
Monocytes Absolute: 0.7 10*3/uL (ref 0.1–1.0)
Monocytes Relative: 7 %
Neutro Abs: 8 10*3/uL — ABNORMAL HIGH (ref 1.7–7.7)
Neutrophils Relative %: 74 %
Platelets: 195 10*3/uL (ref 150–400)
RBC: 2.49 MIL/uL — ABNORMAL LOW (ref 3.87–5.11)
RDW: 22.6 % — ABNORMAL HIGH (ref 11.5–15.5)
WBC: 10.6 10*3/uL — ABNORMAL HIGH (ref 4.0–10.5)
nRBC: 0.2 % (ref 0.0–0.2)

## 2020-02-25 LAB — COMPREHENSIVE METABOLIC PANEL
ALT: 65 U/L — ABNORMAL HIGH (ref 0–44)
AST: 132 U/L — ABNORMAL HIGH (ref 15–41)
Albumin: 1.9 g/dL — ABNORMAL LOW (ref 3.5–5.0)
Alkaline Phosphatase: 271 U/L — ABNORMAL HIGH (ref 38–126)
Anion gap: 9 (ref 5–15)
BUN: <5 mg/dL — ABNORMAL LOW (ref 6–20)
CO2: 25 mmol/L (ref 22–32)
Calcium: 8.4 mg/dL — ABNORMAL LOW (ref 8.9–10.3)
Chloride: 103 mmol/L (ref 98–111)
Creatinine, Ser: 0.42 mg/dL — ABNORMAL LOW (ref 0.44–1.00)
GFR, Estimated: >60 mL/min (ref >60)
Glucose, Bld: 83 mg/dL (ref 70–99)
Potassium: 3.3 mmol/L — ABNORMAL LOW (ref 3.5–5.1)
Sodium: 137 mmol/L (ref 135–145)
Total Bilirubin: 8.1 mg/dL — ABNORMAL HIGH (ref 0.3–1.2)
Total Protein: 7.2 g/dL (ref 6.5–8.1)

## 2020-02-25 LAB — MAGNESIUM: Magnesium: 1.7 mg/dL (ref 1.7–2.4)

## 2020-02-25 LAB — PHOSPHORUS: Phosphorus: 2.4 mg/dL — ABNORMAL LOW (ref 2.5–4.6)

## 2020-02-25 LAB — AMMONIA: Ammonia: 29 umol/L (ref 9–35)

## 2020-02-25 MED ORDER — POTASSIUM CHLORIDE 10 MEQ/100ML IV SOLN
10.0000 meq | INTRAVENOUS | Status: AC
  Administered 2020-02-25 (×3): 10 meq via INTRAVENOUS
  Filled 2020-02-25 (×3): qty 100

## 2020-02-25 NOTE — Progress Notes (Signed)
HEMATOLOGY-ONCOLOGY PROGRESS NOTE  SUBJECTIVE: Cynthia Frye has became more sluggish and slow to response over the weekend, improved some with Lactulose. She is very fatigued, overall condition continues to decline.   Oncology History Overview Note  Cancer Staging Pancreatic cancer [facility]) Staging form: Exocrine Pancreas, AJCC 8th Edition - Clinical stage from 10/18/2019: Stage IV (cT3, cN1, cM1) - Signed by Truitt Merle, MD on 10/18/2019    Pancreatic cancer ([place])  09/19/2019 Imaging   CT AP w contrast 09/19/19  IMPRESSION: 1. Findings are highly concerning for probable primary pancreatic adenocarcinoma in the anterior aspect of the pancreatic head. Several prominent borderline enlarged lymph nodes are noted in the hepatoduodenal nodal station, and there are multiple indeterminate liver lesions which are highly concerning for probable hepatic metastases. Further evaluation with nonemergent abdominal MRI with and without IV gadolinium with MRCP is recommended in the near future to better evaluate these findings.   09/25/2019 Procedure   Upper EUS by Dr Paulita Fujita  IMPRESSION -There was no evidence of significant pathology in the left lobe of the liver.  -A few lymph nodes were visualized and measures in the peripancreatic region and porta hepata region.  -Hyperchoic material consistent with sludge was visualized endosonographically in the gallbladder.  -There was no sign of significant pathology in the common bile duct.  -A mass was identified in the pancreatic head. If biopsy results show adenocarcinoma, it would be staged T3N1Mx by endosonographic criteria. Fine needle aspiration performed.    09/25/2019 Initial Biopsy   FINAL MICROSCOPIC DIAGNOSIS: Fine needle aspirate, Pancreas;  MALIGNANT CELLS PRESENT CONSISTENT WITH ADENOCARCINOMA.    10/02/2019 Initial Diagnosis   Pancreatic cancer ([place])   10/11/2019 Procedure   PAC placement y Dr Barry Dienes    10/15/2019 Imaging   CT Chest  IMPRESSION: 1.  Small anterior left pneumothorax with dependent atelectasis in the left lower lobe. 2. Increased number of bilateral axillary and subpectoral lymph nodes with mild lymphadenopathy in the left axilla. While this would be an atypical presentation for metastatic pancreatic cancer, this possibility is not excluded 3. Main duct dilatation in the pancreas, better assessed on abdomen CT 09/19/2019.   10/16/2019 Imaging   MRI Abdomen  IMPRESSION: 1. Substantially motion degraded scan. 2. Probable persistent small anterior left lung base pneumothorax, better seen on chest CT from 1 day prior. 3. Poorly marginated hypoenhancing 3.7 x 2.9 cm pancreatic head mass, which appears to invade the anterior peripancreatic fat, compatible with known pancreatic adenocarcinoma. Diffuse irregular dilatation of the main pancreatic duct in the pancreatic body and tail. Mild narrowing of the main portal vein by the mass. Abdominal vasculature remains patent and otherwise uninvolved. 4. Numerous (greater than 10) small liver masses scattered throughout the liver, largest 1.0 cm, which appear to demonstrate targetoid enhancement on the limited motion degraded postcontrast sequences, compatible with liver metastases. 5. Mild porta hepatis adenopathy, suspicious for metastatic disease.   10/18/2019 Cancer Staging   Staging form: Exocrine Pancreas, AJCC 8th Edition - Clinical stage from 10/18/2019: Stage IV (cT3, cN1, cM1) - Signed by Truitt Merle, MD on 10/18/2019   10/24/2019 - 11/21/2019 Chemotherapy   First-line FOLFIRINOX q2weeks starting 10/24/19. C2 postponed due to N/V/D and dose reduced 20-30%. Given poor tolerance, stopped after 2 cycles.    10/30/2019 Pathology Results   FINAL MICROSCOPIC DIAGNOSIS:   A. LIVER, LESION, BIOPSY:  -  Metastatic carcinoma  -  See comment   COMMENT:   By immunohistochemistry, the neoplastic cells are positive for  cytokeratin 7 and GATA3  with patchy nonspecific staining for  PAX 8 but  negative for TTF-1, CDX2 and cytokeratin 20.  Overall, the findings are  consistent with metastasis of the patient's known breast carcinoma.  Prognostic panel (ER, PR, Her-2) is pending and will be reported in an  addendum.    ADDENDUM:   Dr. Laurence Ferrari notified us (November 01, 2019) that the patient was also  being worked up for a pancreatic mass.  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  In addition, ER and PR  are negative.  Gata-3 can be expressed in the pancreatic  adenocarcinomas; therefore, pancreatobiliary primary remains in the  differential.    11/02/2019 Genetic Testing   Negative genetic testing: no pathogenic variants detected in [employer] Common Hereditary Cancers Panel.  The report date is November 02, 2019.   The Common Hereditary Cancers Panel offered by [facility] includes sequencing and/or deletion duplication testing of the following 48 genes: APC, ATM, AXIN2, BARD1, BMPR1A, BRCA1, BRCA2, BRIP1, CDH1, CDK4, CDKN2A (p14ARF), CDKN2A (p16INK4a), CHEK2, CTNNA1, DICER1, EPCAM (Deletion/duplication testing only), GREM1 (promoter region deletion/duplication testing only), KIT, MEN1, MLH1, MSH2, MSH3, MSH6, MUTYH, NBN, NF1, NHTL1, PALB2, PDGFRA, PMS2, POLD1, POLE, PTEN, RAD50, RAD51C, RAD51D, RNF43, SDHB, SDHC, SDHD, SMAD4, SMARCA4. STK11, TP53, TSC1, TSC2, and VHL.  The following genes were evaluated for sequence changes only: SDHA and HOXB13 c.251G>A variant only.   11/04/2019 Imaging   CT AP  IMPRESSION: 1. Circumferential bowel wall thickening with adjacent fat stranding throughout the colon, most predominant in the transverse colon. This is consistent with colitis, which may be infectious or inflammatory in etiology. 2. Ill-defined pancreatic head mass consistent with known malignancy, similar to mildly increased in comparison to prior CT. There are innumerable hypodense masses throughout the liver, increased in conspicuity in  comparison to prior CT. Findings are worrisome for worsening metastatic disease. 3. Filling defect in the LEFT internal iliac vein, likely a thrombus with differential considerations including mixing artifact.   11/24/2019 Imaging   CT AP  IMPRESSION: Pancreatic head mass again noted compatible with patient's given history of pancreatic cancer.   Numerous metastases throughout the liver, enlarging since prior study.     12/10/2019 Procedure   Upper endoscopy by Dr Michail Sermon  IMPRESSION - Z-line regular, 42 cm from the incisors. - Esophageal ulcer with stigmata of recent bleeding. - Gastritis. Biopsied. - Mucosal changes in the duodenum. Biopsied. - Normal second portion of the duodenum. - Gastric and duodenal thickening concerning for inflammation from mets or due to mets from known pancreatic cancer.   FINAL MICROSCOPIC DIAGNOSIS:   A. DUODENUM, BIOPSY:  - Benign duodenal mucosa.  - No dysplasia or malignancy.   B. STOMACH, BIOPSY:  - Antral mucosa with mild chronic active inflammation.  - No intestinal metaplasia, dysplasia or carcinoma.   12/28/2019 -  Chemotherapy   Change her to second-line Gemcitabine 2 weeks on/1 week off starting 12/28/19.    01/16/2020 Imaging   MRI abdomen  IMPRESSION: 1. Prominently dilated dorsal pancreatic duct extending to the level of the mass in the junction of the pancreatic head and body. The mass abuts the gastric wall, portal vein, and common bile duct, leading to mild narrowing of the common bile duct down to about 0.3 cm, with the common hepatic duct mildly prominent 0.9 cm. The mass has mildly enlarged compared to the CT examination of 11/24/2019. 2. Extensive metastatic burden throughout all lobes of the liver, substantially increased from 11/24/2019 and fairly similar  to 01/11/2020. 3. Trace left pleural effusion with adjacent atelectasis in the left lower lobe. Borderline cardiomegaly. 4. Sludge in the gallbladder with  gallbladder wall thickening.   01/24/2020 Imaging   CT AP  IMPRESSION: 1. No acute abnormality. 2. No significant change in the known pancreatic malignancy with associated marked dilatation of the pancreatic duct. 3. Stable extensive liver metastatic disease.     I have reviewed the past medical history, past surgical history, social history and family history with the patient and they are unchanged from previous note.   PHYSICAL EXAMINATION: ECOG PERFORMANCE STATUS: 3 - Symptomatic, >50% confined to bed  Vitals:   02/25/20 1740 02/25/20 2045  BP: 140/80 (!) 142/88  Pulse: (!) 112 (!) 112  Resp: 18 16  Temp: 99.1 F (37.3 C) 98.9 F (37.2 C)  SpO2: 97% 100%   Filed Weights   02/17/20 2345  Weight: 175 lb 0.7 oz (79.4 kg)    Intake/Output from previous day: 01/30 0701 - 01/31 0700 In: 520 [P.O.:120; IV Piggyback:400] Out: -   GENERAL:alert, no distress and comfortable SKIN: skin color, texture, turgor are normal, no rashes or significant lesions EYES: normal, Conjunctiva are pink and non-injected, sclera clear Musculoskeletal:no cyanosis of digits and no clubbing  NEURO: alert & oriented x 3 with fluent speech, no focal motor/sensory deficits  LABORATORY DATA:  I have reviewed the data as listed CMP Latest Ref Rng & Units 02/25/2020 02/24/2020 02/23/2020  Glucose 70 - 99 mg/dL 83 93 93  BUN 6 - 20 mg/dL <5(L) <5(L) <5(L)  Creatinine 0.44 - 1.00 mg/dL 0.42(L) 0.42(L) 0.44  Sodium 135 - 145 mmol/L 137 135 136  Potassium 3.5 - 5.1 mmol/L 3.3(L) 3.0(L) 2.9(L)  Chloride 98 - 111 mmol/L 103 101 104  CO2 22 - 32 mmol/L '25 24 25  ' Calcium 8.9 - 10.3 mg/dL 8.4(L) 8.2(L) 7.9(L)  Total Protein 6.5 - 8.1 g/dL 7.2 6.9 6.5  Total Bilirubin 0.3 - 1.2 mg/dL 8.1(H) 8.8(H) 7.4(H)  Alkaline Phos 38 - 126 U/L 271(H) 269(H) 301(H)  AST 15 - 41 U/L 132(H) 131(H) 128(H)  ALT 0 - 44 U/L 65(H) 65(H) 67(H)    Lab Results  Component Value Date   WBC 10.6 (H) 02/25/2020   HGB 8.1 (L)  02/25/2020   HCT 25.4 (L) 02/25/2020   MCV 102.0 (H) 02/25/2020   PLT 195 02/25/2020   NEUTROABS 8.0 (H) 02/25/2020    CT ANGIO CHEST PE W OR WO CONTRAST  Result Date: 02/01/2020 CLINICAL DATA:  Increasing right rib pain for 1 day. Nausea and vomiting EXAM: CT ANGIOGRAPHY CHEST WITH CONTRAST TECHNIQUE: Multidetector CT imaging of the chest was performed using the standard protocol during bolus administration of intravenous contrast. Multiplanar CT image reconstructions and MIPs were obtained to evaluate the vascular anatomy. CONTRAST:  15m OMNIPAQUE IOHEXOL 350 MG/ML SOLN COMPARISON:  01/11/2020 FINDINGS: Cardiovascular: Normal heart size. No pericardial effusion. No acute aortic finding. Extensive motion and streak artifact. Pulmonary emboli are again seen at the bifurcation of the right main pulmonary artery, peripheral within the vessel and nonacute. More peripheral pulmonary arteries are not reliably characterized due to the degree of artifact. No definite progression from 01/11/2020. Mediastinum/Nodes: Mild enlargement of bilateral axillary lymph nodes which may be related to history of connective tissue disease. No mediastinal adenopathy. Lungs/Pleura: Reportedly patient is COVID positive per the chart. There is no edema, consolidation, effusion, or pneumothorax. Small opacity at the lingula which has a scar-like appearance, also seen on prior. No suspicious  pulmonary nodules. Upper Abdomen: Reported separately Musculoskeletal: No acute finding. Review of the MIP images confirms the above findings. IMPRESSION: 1. Very limited CTA due to the extent of motion and streak artifact. Persisting nonacute right-sided pulmonary emboli as seen on 01/11/2020 chest CTA. No clear progression of emboli. DVT is present at the left common femoral vein on prior abdominal CT. 2. COVID positivity without pneumonia. Electronically Signed   By: Monte Fantasia M.D.   On: 02/01/2020 04:53   CT ABDOMEN PELVIS W  CONTRAST  Result Date: 02/17/2020 CLINICAL DATA:  Abdominal pain, metastatic pancreatic cancer, COVID positive EXAM: CT ABDOMEN AND PELVIS WITH CONTRAST TECHNIQUE: Multidetector CT imaging of the abdomen and pelvis was performed using the standard protocol following bolus administration of intravenous contrast. CONTRAST:  130m OMNIPAQUE IOHEXOL 300 MG/ML  SOLN COMPARISON:  02/06/2020 FINDINGS: Lower chest: Scarring/atelectasis in the lingula and left lower lobe. Hepatobiliary: Innumerable hepatic metastases, similar to recent CT. Gallbladder is unremarkable, noting mild gallbladder wall edema, similar. No intrahepatic or extrahepatic ductal dilatation. Pancreas: 3.9 x 2.9 cm mass in the pancreatic head (series 2/image 46), similar, corresponding to the patient's known pancreatic adenocarcinoma. Atrophy of the pancreatic body/tail with ductal dilatation. Spleen: Within normal limits. Adrenals/Urinary Tract: Adrenal glands are within normal limits. Segmental/wedge-shaped hypoperfusion involving the posterolateral right lower kidney (series 2/images 51-52). Associated differential enhancement involving the posterior left lower kidney (series 7/image 43). These findings are nonspecific but are most suggestive of pyelonephritis. No hydronephrosis. Bladder is within normal limits. Stomach/Bowel: Stomach is within normal limits. No evidence of bowel obstruction. Appendix is not discretely visualized. No colonic wall thickening or inflammatory changes. Vascular/Lymphatic: No evidence of abdominal aortic aneurysm. Stable DVT in the left common femoral vein (series 2/image 93). 14 mm short axis portacaval node (series 2/image 41), unchanged. No suspicious pelvic lymphadenopathy. Reproductive: Status post hysterectomy. No adnexal masses. Other: Small to moderate abdominopelvic ascites. Associated mild pelvic peritoneal thickening. Musculoskeletal: Mild degenerative changes at L5-S1. IMPRESSION: Mildly heterogeneous  perfusion involving the bilateral kidneys, nonspecific but most suggestive of pyelonephritis. Otherwise, no interval change from recent CT. Pancreatic adenocarcinoma. Innumerable hepatic metastases. Small upper abdominal nodal metastasis. Small to moderate abdominopelvic ascites with mild peritoneal thickening, likely malignant. Stable DVT in the left common femoral vein. Electronically Signed   By: SJulian HyM.D.   On: 02/17/2020 16:18   CT ABDOMEN PELVIS W CONTRAST  Result Date: 02/06/2020 CLINICAL DATA:  Abdominal pain and fever. Metastatic pancreatic adenocarcinoma. EXAM: CT ABDOMEN AND PELVIS WITH CONTRAST TECHNIQUE: Multidetector CT imaging of the abdomen and pelvis was performed using the standard protocol following bolus administration of intravenous contrast. CONTRAST:  1090mOMNIPAQUE IOHEXOL 300 MG/ML  SOLN COMPARISON:  CT 5 days ago 02/01/2020 FINDINGS: Lower chest: Stable lingular opacity. Slight increase in bibasilar atelectasis. No pleural fluid. Hepatobiliary: Innumerable hepatic metastasis. No significant change in the short interim. Layering sludge in the gallbladder. Mild gallbladder wall thickening. There is no biliary obstruction or discrete stone. Pancreas: Pancreatic head mass measures 3.4 cm prom distal pancreatic ductal dilatation with pancreatic atrophy unchanged. No adjacent inflammatory change. Spleen: Normal in size without focal abnormality. Adrenals/Urinary Tract: No adrenal nodule. The previous low-density band in the lower left kidney has improved and near completely resolved. There is homogeneous renal enhancement with symmetric excretion on delayed phase imaging. Unremarkable urinary bladder. Stomach/Bowel: Nondistended stomach. Pancreatic mass abuts the distal gastric body without gastric outlet obstruction. Small amount of enteric contrast is seen involving distal small bowel and throughout the colon.  No obstruction. Suggestion of small bowel thickening and  enhancement involving short segment in the left lower quadrant, series 2, image 71. There is improved submucosal low-density in the small bowel from prior. Submucosal fatty deposition of the ascending colon is again seen. Slight increase in submucosal fatty deposition involving the transverse and descending colon. Vascular/Lymphatic: Left femoral DVT again seen. No progression from prior. No portal vein thrombosis. Normal caliber abdominal aorta. No acute vascular findings. Enlarged periportal node, largest measuring 14 mm, unchanged. No progressive adenopathy in the short interim. Reproductive: Hysterectomy.  Ovaries not delineated. Other: Small volume abdominopelvic ascites, which has slightly progressed from prior exam. Peritoneal thickening in the right pericolic gutter is again seen. Mild generalized omental and mesenteric edema. Patchy subcutaneous densities in the anterior abdominal wall with foci of air typical of medication injection sites. Musculoskeletal: No focal bone lesion or acute osseous abnormality. IMPRESSION: 1. Short segment loop of small bowel with wall thickening and mild enhancement in the left lower quadrant may represent focal enteritis. 2. Pancreatic head mass with innumerable hepatic metastasis. Peritoneal thickening in the right pericolic gutter is again seen. No significant change from CT 5 days ago. 3. Small volume abdominopelvic ascites, slightly progressed from prior exam. 4. Improved bandlike hypodensity in the lower left kidney, near completely resolved. 5. Gallbladder sludge. Gallbladder wall thickening or small amount of pericholecystic fluid, likely reactive. 6. Left femoral DVT again seen. Electronically Signed   By: Keith Rake M.D.   On: 02/06/2020 19:48   CT ABDOMEN PELVIS W CONTRAST  Result Date: 02/01/2020 CLINICAL DATA:  Acute right flank pain.  Pancreatic cancer EXAM: CT ABDOMEN AND PELVIS WITH CONTRAST TECHNIQUE: Multidetector CT imaging of the abdomen and pelvis  was performed using the standard protocol following bolus administration of intravenous contrast. CONTRAST:  15m OMNIPAQUE IOHEXOL 350 MG/ML SOLN COMPARISON:  01/24/2020 FINDINGS: Lower chest:  Reported separately Hepatobiliary: Innumerable metastases throughout the liver. Gallbladder sludge or calculi is likely. No evidence of acute cholecystitis. Pancreas: Mass at the pancreatic head with marked proximal ductal dilatation and atrophy. The mass measures approximately 3 cm. No acute superimposed inflammation. Spleen: Unremarkable. Adrenals/Urinary Tract: Negative adrenals. There is a band of low-density in the posterior and lower left kidney. No hydronephrosis. Unremarkable bladder. Stomach/Bowel: Extensive submucosal low-density within small bowel proximal colon which appears fairly well-defined and has the appearance of fat deposition, also seen on prior but more prominent today. No bowel obstruction or clear bowel edema. Gastric outlet is distorted by the pancreatic mass, but no gastric dilatation. The appendix is not well visualized. No pericecal inflammation. Vascular/Lymphatic: DVT in the left common femoral vein which is resolved by the level of the common femoral bifurcation. This finding will be called in conjunction with chest CT findings. Prominent lymph nodes in the deep liver drainage about the pancreatic head, similar and likely metastatic. Reproductive:Hysterectomy. Other: Small volume ascites in the pelvis. Minimal interloop fluid is also seen. No discrete peritoneal nodularity but there is likely mild thickening of the peritoneum the upper right pericolic gutter. Musculoskeletal: No acute abnormalities. IMPRESSION: 1. DVT in the left common femoral vein, nonobstructive. 2. Band of hypoenhancement in the lower left kidney, contralateral to site of symptoms. Pyelonephritis is considered, please correlate with urinalysis. 3. Known pancreas cancer with extensive liver metastases. 4. Small volume  ascites with possible peritoneal thickening, concerning for early peritoneal metastatic disease. 5. Gallbladder sludge or calculi. No evidence of acute cholecystitis. Electronically Signed   By: JNeva SeatD.  On: 02/01/2020 04:47   DG Chest Port 1 View  Result Date: 02/17/2020 CLINICAL DATA:  Weakness and dehydration. Recent history of COVID-19. EXAM: PORTABLE CHEST 1 VIEW COMPARISON:  Chest radiograph 02/01/2020. FINDINGS: Port-A-Cath tip projects over the superior vena cava. Monitoring leads overlie the patient. Stable cardiac and mediastinal contours. Low lung volumes with bibasilar opacities favored represent atelectasis. No pleural effusion or pneumothorax. IMPRESSION: Low lung volumes with bibasilar atelectasis. Electronically Signed   By: Lovey Newcomer M.D.   On: 02/17/2020 13:29   DG Chest Port 1 View  Result Date: 02/01/2020 CLINICAL DATA:  Sepsis, right anterior chest pain, history of pancreatic cancer EXAM: PORTABLE CHEST 1 VIEW COMPARISON:  11/24/2019 FINDINGS: Single frontal view of the chest demonstrates stable left chest wall port. Cardiac silhouette is unremarkable. Lung volumes are diminished, with no acute airspace disease, effusion, or pneumothorax. No acute bony abnormalities. IMPRESSION: 1. Low lung volumes.  No acute process. Electronically Signed   By: Randa Ngo M.D.   On: 02/01/2020 01:32   [place] ASCITES (ABDOMEN LIMITED)  Result Date: 02/24/2020 CLINICAL DATA:  Rule out ascites EXAM: LIMITED ABDOMEN ULTRASOUND FOR ASCITES TECHNIQUE: Limited ultrasound survey for ascites was performed in all four abdominal quadrants. COMPARISON:  None. FINDINGS: Small amount of ascites in all 4 quadrants.  No loculated fluid. IMPRESSION: Mild amount of ascites. Electronically Signed   By: Franchot Gallo M.D.   On: 02/24/2020 16:08    ASSESSMENT AND PLAN: 1. Metastatic pancreatic cancer 2. Severe sepsis secondary to pyelonephritis 3. Abdominal pain, nausea and vomiting 4. Left iliac  DVT, left peroneal and gastrocnemius vein DVT 5. Anemia 6. Thrombocytopenia 7. Transaminitis and hyperbilirubinemia secondary to #1, worsening  8. Protein calorie malnutrition 9. Goals of care discussion   -Cynthia Frye's overall condition continues to decline. She has progressive worsening LFTs, and hepatic encephalopathy.  -Dr. Florene Glen, Dr. Domingo Cocking and I had family meeting with pt and her husband around noon today. We all recommend hospice care, especially residential hospice care, giving her overall grim prognosis and her needs for high level of symptomatic management. Pt and her husband are more open to hospice now, and they will think about, and talk to hospice coordinator again  -Palliative care team will continue her symptom management while she is in hospital -I will f/u as needed. Please call me if anything I can help with.   I spent a total of 45 mins for her visit today, including her care coordination.    LOS: 8 days   Truitt Merle 02/25/20

## 2020-02-25 NOTE — Progress Notes (Signed)
PROGRESS NOTE    Cynthia Frye  HYW:[id number] DOB: 21-Apr-1960 DOA: 02/17/2020 PCP: Josetta Huddle, MD   Chief Complaint  Patient presents with  . Weakness  . Dehydration   Brief Narrative:  [name] a59 y.o.female,with medical history of pancreatic cancer s/p chemotherapy, lupus, asthma, recent COVID-19 infection,which was treated with remdesivir in the hospital for 3 days during an admission for sepsis of unclear origin who has continued to have abdominal pain, nausea, and failure to thrive at home prompting return to the ED 1/23. She was tachycardic, tachypneic with CT abd/pelvis demonstrating known widespread cancer and mild heterogenous contrast uptake in kidneys bilaterally. She was given broad spectrum antibiotics and sepsis-dose IV fluids and admitted to SDU.  Assessment & Plan:   Active Problems:   Severe sepsis [facility])   Pancreatic cancer metastasized to liver [facility])   Pyelonephritis  Goals of care discussion: The patient has advanced cancer which has progressed despite treatment (which she has tolerated poorly), now no longer on treatment of any kind. Hospice has been recommended.  - goals of care meeting this AM (1/31) with Dr. Burr Medico and Dr. Domingo Cocking, appreciate their assistance -> due to her progressive liver function, encephalopathy, and poor overall prognosis, recommended inpatient hospice.  Appreciate assistance of Dr. Domingo Cocking and Dr. Burr Medico.  Mr. Aye and Mikeila are processing our discussion, they haven't made Marybella Ethier decision yet.  - appreciate palliative care assistance with symptom management - appears that she's rapidly declining, I doubt she'll be able to d/c home at this point as they initially planned.  Discussed with pt and husband today (husband seems to understand, pt seems to still have some denial when she's engaged).  Acute Metabolic Encephalopathy  Hepatic Encephalopathy: continued sluggishness, slow to respond today, but Fonnie Crookshanks&Ox3.  Ammonia improved,  asterixis is improved.  Encephalopathy seems improved.  Suspect this is hepatic encephalopathy due to her progressive liver failure.  Meds could also contribute, but she still complains of pain.  Lactulose, continue to monitor   Hypoalbuminemia  Anasarca: will give albumin, continue to monitor.   Mild amount of ascitics on [place]  Severe sepsis due to pyelonephritis: Diagnosed at admission based on tachycardia, tachypnea, abnormalities of kidneys on CT abdomen.  - Febrile 1/24. Urine culture growing Enterococcus faecalis > susceptibility to ampicillin (1/25 - present - currently on day 9 when including vanc - discussed with pharmacy) plan for 10 days - Follow blood cultures, repeat cultures now since febrile.  Recent covid-19 infection: +SARS-CoV-2 testing 1/7. s/p Pfizer July, Aug 2021. Treated with some remdesivir, never hypoxic to require steroids. Has completed 10 days of isolation.  - DC isolation.  Acute left CFV DVT, chronic right-sided pulmonary emboli: Left iliac vein DVT (10/2019), DVT in the left peroneal and gastrocnemius veins (01/07/2020), DVT in the left common femoral vein (02/01/2020) - Lovenox 1mg /kg q12h  Hyponatremia:  - d/c LR in setting of anasarca  Hypokalemia: replace and follow  Nausea, vomiting, intolerance to enteral feeding, GERD, severe protein calorie malnutrition: Prealbumin undetectable at recent hospitalization. - Maximize antiemetics, hold TPN for now.   Metastatic pancreatic cancer, elevated LFTs  Liver Failure: Progressive despite chemotherapy which is also poorly tolerated. No longer on chemotherapy, Sneads conversations as noted above.  - bilirubin rising, 8.1 today, INR 1.9.  AST/ALT relatively stable. continue to follow (no intrahepatic or extrahepatic ductal dilatation on 1/23 CT scan) -> she has likely hepatic encephalopathy, progressive liver failure - prognosis is poor - discussed with Dr. Burr Medico - likely 2/2 metastatic pancreatic  cancer - Palliative  care consulted  Pancytopenia, anemia of malignancy:  - fluctuating Hb, continue to monitor - 8 today - transfuse for <7  Thrombocytopenia: resolved  SLE: No flare. Hold home meds for now  HTN:  - Restart metoprolol. Hold other home meds with softer BPs on admit. Restart when rising BP.  DVT prophylaxis: lovenox Code Status: full  Family Communication: husband 1/31 Disposition:   Status is: Inpatient  Remains inpatient appropriate because:Inpatient level of care appropriate due to severity of illness   Dispo: The patient is from: Home              Anticipated d/c is to: Home              Anticipated d/c date is: > 3 days              Patient currently is not medically stable to d/c.   Difficult to place patient No       Consultants:   Palliative care  oncology  Procedures:  none  Antimicrobials:  Anti-infectives (From admission, onward)   Start     Dose/Rate Route Frequency Ordered Stop   02/19/20 1315  ampicillin (OMNIPEN) 2 g in sodium chloride 0.9 % 100 mL IVPB        2 g 300 mL/hr over 20 Minutes Intravenous Every 6 hours 02/19/20 1218     02/18/20 1400  cefTRIAXone (ROCEPHIN) 2 g in sodium chloride 0.9 % 100 mL IVPB  Status:  Discontinued        2 g 200 mL/hr over 30 Minutes Intravenous Every 24 hours 02/18/20 0644 02/19/20 1218   02/18/20 0200  vancomycin (VANCOCIN) IVPB 1000 mg/200 mL premix  Status:  Discontinued        1,000 mg 200 mL/hr over 60 Minutes Intravenous Every 12 hours 02/17/20 1847 02/18/20 0644   02/17/20 2100  ceFEPIme (MAXIPIME) 2 g in sodium chloride 0.9 % 100 mL IVPB  Status:  Discontinued        2 g 200 mL/hr over 30 Minutes Intravenous Every 8 hours 02/17/20 1841 02/18/20 0644   02/17/20 1400  vancomycin (VANCOREADY) IVPB 2000 mg/400 mL        2,000 mg 200 mL/hr over 120 Minutes Intravenous  Once 02/17/20 1308 02/17/20 2011   02/17/20 1300  ceFEPIme (MAXIPIME) 2 g in sodium chloride 0.9 % 100 mL IVPB        2 g 200 mL/hr  over 30 Minutes Intravenous  Once 02/17/20 1251 02/17/20 1350   02/17/20 1300  metroNIDAZOLE (FLAGYL) IVPB 500 mg        500 mg 100 mL/hr over 60 Minutes Intravenous  Once 02/17/20 1251 02/17/20 1502      Subjective: Alert oriented Doesn't say much for familyy meeting  Objective: Vitals:   02/24/20 2118 02/25/20 0604 02/25/20 1521 02/25/20 1740  BP: (!) 143/91 (!) 141/89 (!) 142/86 140/80  Pulse: (!) 101 (!) 104 (!) 114 (!) 112  Resp: 17 18  18   Temp: 98.9 F (37.2 C) 99 F (37.2 C) 99.4 F (37.4 C) 99.1 F (37.3 C)  TempSrc:   Oral   SpO2: 100% 99% 98% 97%  Weight:      Height:        Intake/Output Summary (Last 24 hours) at 02/25/2020 1830 Last data filed at 02/25/2020 1506 Gross per 24 hour  Intake 660 ml  Output 250 ml  Net 410 ml   Filed Weights   02/17/20 2345  Weight:  79.4 kg    Examination:  General: No acute distress. Lungs: unlabored Abdomen: mild distension  Neurological: Alert and oriented. Moves all extremities 4. Cranial nerves II through XII grossly intact. Skin: Warm and dry. No rashes or lesions. Extremities: diffuse edema, anasarca    Data Reviewed: I have personally reviewed following labs and imaging studies  CBC: Recent Labs  Lab 02/21/20 0453 02/22/20 0549 02/23/20 0435 02/24/20 0335 02/25/20 0400  WBC 7.4 8.3 8.2 9.0 10.6*  NEUTROABS 5.0 6.1 6.0 6.9 8.0*  HGB 7.7* 7.7* 7.6* 8.0* 8.1*  HCT 24.2* 23.9* 23.6* 24.2* 25.4*  MCV 99.2 98.4 98.7 100.8* 102.0*  PLT 157 176 174 185 824    Basic Metabolic Panel: Recent Labs  Lab 02/21/20 0453 02/22/20 0549 02/23/20 0435 02/24/20 0335 02/25/20 0400  NA 134* 133* 136 135 137  K 3.4* 3.3* 2.9* 3.0* 3.3*  CL 103 100 104 101 103  CO2 23 22 25 24 25   GLUCOSE 76 89 93 93 83  BUN 8 7 <5* <5* <5*  CREATININE 0.46 0.47 0.44 0.42* 0.42*  CALCIUM 8.1* 8.0* 7.9* 8.2* 8.4*  MG 1.5* 1.6* 1.6* 1.7 1.7  PHOS 2.4* 3.1 2.4* 2.4* 2.4*    GFR: Estimated Creatinine Clearance: 83.1 mL/min  (Juanice Warburton) (by C-G formula based on SCr of 0.42 mg/dL (L)).  Liver Function Tests: Recent Labs  Lab 02/21/20 0453 02/22/20 0549 02/23/20 0435 02/24/20 0335 02/25/20 0400  AST 142* 137* 128* 131* 132*  ALT 73* 73* 67* 65* 65*  ALKPHOS 336* 353* 301* 269* 271*  BILITOT 6.8* 7.6* 7.4* 8.8* 8.1*  PROT 6.4* 7.0 6.5 6.9 7.2  ALBUMIN 1.6* 1.7* 1.5* 2.1* 1.9*    CBG: Recent Labs  Lab 02/23/20 0012 02/23/20 0457  GLUCAP 79 93     Recent Results (from the past 240 hour(s))  Blood Culture (routine x 2)     Status: None   Collection Time: 02/17/20  1:00 PM   Specimen: BLOOD  Result Value Ref Range Status   Specimen Description   Final    BLOOD SITE NOT SPECIFIED Performed at [facility], [address], [place], [place] [address]    Special Requests   Final    BOTTLES DRAWN AEROBIC AND ANAEROBIC Blood Culture adequate volume Performed at [facility] [address], [place], [place] [address]    Culture   Final    NO GROWTH 5 DAYS Performed at [facility], [place] [address] [place], [place] [address]    Report Status 02/22/2020 FINAL  Final  Urine culture     Status: Abnormal   Collection Time: 02/17/20  8:20 PM   Specimen: In/Out Cath Urine  Result Value Ref Range Status   Specimen Description   Final    IN/OUT CATH URINE Performed at [place] [address] [place], [place] [address]    Special Requests   Final    NONE Performed at [facility], [place] [address] [place], [place] [address]    Culture 40,000 COLONIES/mL ENTEROCOCCUS FAECALIS (Donnavin Vandenbrink)  Final   Report Status 02/20/2020 FINAL  Final   Organism ID, Bacteria ENTEROCOCCUS FAECALIS (Sajan Cheatwood)  Final      Susceptibility   Enterococcus faecalis - MIC*    AMPICILLIN <=2 SENSITIVE Sensitive     NITROFURANTOIN <=16 SENSITIVE Sensitive     VANCOMYCIN 1 SENSITIVE Sensitive     * 40,000 COLONIES/mL ENTEROCOCCUS FAECALIS  MRSA PCR Screening  Status: None    Collection Time: 02/17/20 11:18 PM   Specimen: Nasal Mucosa; Nasopharyngeal  Result Value Ref Range Status   MRSA by PCR NEGATIVE NEGATIVE Final    Comment:        The GeneXpert MRSA Assay (FDA approved for NASAL specimens only), is one component of Taten Merrow comprehensive MRSA colonization surveillance program. It is not intended to diagnose MRSA infection nor to guide or monitor treatment for MRSA infections. Performed at [facility], [place] [address], [place], [place] [address]   Culture, blood (routine x 2)     Status: None   Collection Time: 02/19/20  1:27 PM   Specimen: BLOOD  Result Value Ref Range Status   Specimen Description   Final    BLOOD RIGHT ANTECUBITAL Performed at [facility] [address] [place], [place] [address]    Special Requests   Final    BOTTLES DRAWN AEROBIC ONLY Blood Culture results may not be optimal due to an inadequate volume of blood received in culture bottles Performed at [facility] [address], [place], [place] [address]    Culture   Final    NO GROWTH 5 DAYS Performed at [facility], [place] [address] [place], [place] [address]    Report Status 02/24/2020 FINAL  Final  Culture, blood (routine x 2)     Status: None   Collection Time: 02/19/20  1:34 PM   Specimen: BLOOD RIGHT HAND  Result Value Ref Range Status   Specimen Description   Final    BLOOD RIGHT HAND Performed at [facility] [address], [place], [place] [address]    Special Requests   Final    BOTTLES DRAWN AEROBIC ONLY Blood Culture results may not be optimal due to an inadequate volume of blood received in culture bottles Performed at [place] [address] [place], [place] [address]    Culture   Final    NO GROWTH 5 DAYS Performed at [facility], [place] [address] [place],  [address]    Report Status 02/24/2020 FINAL  Final          Radiology Studies: [place] ASCITES (ABDOMEN LIMITED)  Result Date: 02/24/2020 CLINICAL DATA:  Rule out ascites EXAM: LIMITED ABDOMEN ULTRASOUND FOR ASCITES TECHNIQUE: Limited ultrasound survey for ascites was performed in all four abdominal quadrants. COMPARISON:  None. FINDINGS: Small amount of ascites in all 4 quadrants.  No loculated fluid. IMPRESSION: Mild amount of ascites. Electronically Signed   By: Franchot Gallo M.D.   On: 02/24/2020 16:08        Scheduled Meds: . Chlorhexidine Gluconate Cloth  6 each Topical Daily  . dronabinol  2.5 mg Oral BID AC  . enoxaparin  80 mg Subcutaneous Q12H  . feeding supplement  237 mL Oral BID BM  . lactulose  20 g Oral TID  . mouth rinse  15 mL Mouth Rinse BID  . metoprolol succinate  50 mg Oral Daily  . sodium chloride flush  10-40 mL Intracatheter Q12H  . sucralfate  1 g Oral TID WC & HS   Continuous Infusions: . ampicillin (OMNIPEN) IV 2 g (02/25/20 1747)     LOS: 8 days    Time spent: over 30 min    Fayrene Helper, MD [facility]   To contact the attending provider between 7A-7P or the covering provider during after hours 7P-7A, please log into the web site  [email or web address] and access using universal [place] password for that web site. If you do not have the password, please call the hospital operator.  02/25/2020, 6:30 PM

## 2020-02-25 NOTE — Progress Notes (Signed)
Daily Progress Note   Patient Name: Cynthia Frye       Date: 02/25/2020 DOB: [birth year]  Age: 60 y.o. MRN#: [id number] Attending Physician: Elodia Florence., * Primary Care Physician: Josetta Huddle, MD Admit Date: 02/17/2020  Reason for Consultation/Follow-up: Establishing goals of care  Subjective: I saw and examined Cynthia Frye.  She seems sleepier today and has more difficulty following conversation.  Her husband is at the bedside.  I discussed with him concern about continued worsening of her nutrition, cognition, and functional status and that these are signs that her liver failure continues to worsen.  She was started on lactulose to see if this improves mental status.  Her husband expressed understanding that she continues to grow sicker by the day.  He requested a joint meeting between providers and himself to discuss care plan moving forward.  I told him I work to see if I can set this up for tomorrow. Length of Stay: 8  Current Medications: Scheduled Meds:  . Chlorhexidine Gluconate Cloth  6 each Topical Daily  . dronabinol  2.5 mg Oral BID AC  . enoxaparin  80 mg Subcutaneous Q12H  . feeding supplement  237 mL Oral BID BM  . lactulose  20 g Oral TID  . mouth rinse  15 mL Mouth Rinse BID  . metoprolol succinate  50 mg Oral Daily  . sodium chloride flush  10-40 mL Intracatheter Q12H  . sucralfate  1 g Oral TID WC & HS    Continuous Infusions: . ampicillin (OMNIPEN) IV 2 g (02/25/20 0402)  . potassium chloride 10 mEq (02/25/20 0954)    PRN Meds: guaiFENesin-dextromethorphan, HYDROmorphone (DILAUDID) injection, lip balm, LORazepam, ondansetron (ZOFRAN) IV, prochlorperazine, sodium chloride flush, topiramate  Physical Exam        General: Alert, awake, in no  acute distress. Chronically ill appearing.  Sleepier and more confused HEENT: No bruits, no goiter, no JVD Heart: Regular rate and rhythm. No murmur appreciated. Lungs: Good air movement, clear Abdomen: Soft, nontender, nondistended, positive bowel sounds.  Ext: No significant edema Skin: Warm and dry  Vital Signs: BP (!) 141/89 (BP Location: Left Arm)   Pulse (!) 104   Temp 99 F (37.2 C)   Resp 18   Ht '5\' 7"'  (1.702 m)   Wt 79.4 kg  SpO2 99%   BMI 27.42 kg/m  SpO2: SpO2: 99 % O2 Device: O2 Device: Room Air O2 Flow Rate:    Intake/output summary:   Intake/Output Summary (Last 24 hours) at 02/25/2020 1003 Last data filed at 02/25/2020 0600 Gross per 24 hour  Intake 520 ml  Output --  Net 520 ml   LBM: Last BM Date: 02/24/20 Baseline Weight: Weight: 79.4 kg Most recent weight: Weight: 79.4 kg       Palliative Assessment/Data:    Flowsheet Rows   Flowsheet Row Most Recent Value  Intake Tab   Referral Department Hospitalist  Unit at Time of Referral Med/Surg Unit  Palliative Care Primary Diagnosis Cancer  Date Notified 02/18/20  Palliative Care Type Return patient Palliative Care  Reason for referral Non-pain Symptom, Clarify Goals of Care  Date of Admission 02/17/20  Date first seen by Palliative Care 02/19/20  # of days Palliative referral response time 1 Day(s)  # of days IP prior to Palliative referral 1  Clinical Assessment   Palliative Performance Scale Score 30%  Psychosocial & Spiritual Assessment   Palliative Care Outcomes   Patient/Family meeting held? Yes  Who was at the meeting? patient, husband      Patient Active Problem List   Diagnosis Date Noted  . Pancreatic cancer metastasized to liver ([facility])   . Pyelonephritis   . Severe sepsis ([facility]) 02/17/2020  . COVID-19 virus infection 02/01/2020  . Sepsis ([facility]) 02/01/2020  . Biliary obstruction 01/15/2020  . Abdominal pain 01/04/2020  . Dysphagia 12/10/2019  . Malnutrition of moderate degree  11/26/2019  . 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 Constipation 11/25/2019  . Generalized abdominal pain   . Nausea and vomiting in adult 11/24/2019  . Dehydration 11/24/2019  . Prolonged QT interval 11/24/2019  . Emesis   . Tachycardia   . Genetic testing 11/12/2019  . Colitis 11/04/2019  . Acute deep vein thrombosis of left iliac vein ([facility]) 11/04/2019  . Hypokalemia 11/04/2019  . Port-A-Cath in place 10/24/2019  . Goals of care, counseling/discussion 10/18/2019  . Pancreatic cancer ([facility]) 10/02/2019  . Chronic migraine without aura without status migrainosus, not intractable 04/23/2019  . Migraine with aura and without status migrainosus, not intractable 04/23/2019  . Essential hypertension 04/29/2016  . Lupus (systemic lupus erythematosus) ([facility]) 04/29/2016  . Asthma 04/29/2016  . Seasonal allergies 04/29/2016  . Abnormal stress test 04/09/2016    Palliative Care Assessment & Plan   Patient Profile: Cynthia Frye is a 60 year old female with metastatic pancreatic cancer, lupus, asthma, recent COVID-19 infection who presented to the hospital with continued abdominal pain, nausea, and failure to thrive.  CT demonstrates widespread cancer with heterogenous contrast uptake in the kidneys.  She is currently being treated with antibiotics for severe sepsis due to pyelonephritis.  Palliative consulted for goals of care.  Recommendations/Plan: Full code/full scope I met today and talked with Cynthia Frye and her husband.  I expressed concern about her continued progression of liver failure and continued decline in nutrition and functional status.  I expressed concern if she is going to be able to be cared for at home and her significantly deconditioned state. Her husband requested a meeting tomorrow with myself, Dr. Burr Medico, and Dr. Florene Glen to collaboratively discuss her overall status and care plan moving forward. Palliative to follow-up tomorrow to see if I can for set up a joint  meeting with myself, Dr. Burr Medico, and Dr. Florene Glen.   Goals of Care and Additional Recommendations: Limitations on Scope  of Treatment: Full Scope Treatment  Code Status:    Code Status Orders  (From admission, onward)         Start     Ordered   02/17/20 2319  Full code  Continuous        02/17/20 2318        Code Status History    Date Active Date Inactive Code Status Order ID Comments User Context   02/01/2020 0919 02/07/2020 2350 Full Code [id number]  Jonnie Finner, DO ED   01/15/2020 2037 01/18/2020 1841 Full Code [id number]  Elwyn Reach, MD ED   11/24/2019 1908 12/12/2019 1944 Full Code [id number]  Eugenie Filler, MD Inpatient   11/04/2019 2125 11/09/2019 2207 Full Code [id number]  Rise Patience, MD ED   04/09/2016 1319 04/09/2016 1952 Full Code [id number]  End, Harrell Gave, MD Inpatient   Advance Care Planning Activity      Prognosis:  < 6 months  Discharge Planning: To Be Determined-she appears to be getting weaker by the day.  I am worried that she is going to be strong enough to even consider returning home with hospice.  Care plan was discussed with patient, husband  Thank you for allowing the Palliative Medicine Team to assist in the care of this patient.   Time In: 1830 Time Out: 1850 Total Time 20 Prolonged Time Billed No   Greater than 50%  of this time was spent counseling and coordinating care related to the above assessment and plan.  Micheline Rough, MD  Please contact Palliative Medicine Team phone at [phone] for questions and concerns.

## 2020-02-25 NOTE — Plan of Care (Signed)
  Problem: Coping: Goal: Psychosocial and spiritual needs will be supported Outcome: Progressing   Problem: Skin Integrity: Goal: Risk for impaired skin integrity will decrease Outcome: Progressing   Problem: Safety: Goal: Ability to remain free from injury will improve Outcome: Progressing   Problem: Pain Managment: Goal: General experience of comfort will improve Outcome: Progressing

## 2020-02-26 ENCOUNTER — Ambulatory Visit: Payer: BC Managed Care – PPO | Admitting: Family Medicine

## 2020-02-26 DIAGNOSIS — Z7189 Other specified counseling: Secondary | ICD-10-CM | POA: Diagnosis not present

## 2020-02-26 DIAGNOSIS — Z515 Encounter for palliative care: Secondary | ICD-10-CM | POA: Diagnosis not present

## 2020-02-26 DIAGNOSIS — R531 Weakness: Secondary | ICD-10-CM | POA: Diagnosis not present

## 2020-02-26 DIAGNOSIS — N12 Tubulo-interstitial nephritis, not specified as acute or chronic: Secondary | ICD-10-CM | POA: Diagnosis not present

## 2020-02-26 LAB — COMPREHENSIVE METABOLIC PANEL
ALT: 61 U/L — ABNORMAL HIGH (ref 0–44)
AST: 135 U/L — ABNORMAL HIGH (ref 15–41)
Albumin: 1.9 g/dL — ABNORMAL LOW (ref 3.5–5.0)
Alkaline Phosphatase: 248 U/L — ABNORMAL HIGH (ref 38–126)
Anion gap: 11 (ref 5–15)
BUN: 7 mg/dL (ref 6–20)
CO2: 24 mmol/L (ref 22–32)
Calcium: 8.5 mg/dL — ABNORMAL LOW (ref 8.9–10.3)
Chloride: 105 mmol/L (ref 98–111)
Creatinine, Ser: 0.4 mg/dL — ABNORMAL LOW (ref 0.44–1.00)
GFR, Estimated: >60 mL/min (ref >60)
Glucose, Bld: 89 mg/dL (ref 70–99)
Potassium: 3.5 mmol/L (ref 3.5–5.1)
Sodium: 140 mmol/L (ref 135–145)
Total Bilirubin: 8.2 mg/dL — ABNORMAL HIGH (ref 0.3–1.2)
Total Protein: 7.2 g/dL (ref 6.5–8.1)

## 2020-02-26 LAB — CBC WITH DIFFERENTIAL/PLATELET
Abs Immature Granulocytes: 0.06 10*3/uL (ref 0.00–0.07)
Basophils Absolute: 0.1 10*3/uL (ref 0.0–0.1)
Basophils Relative: 1 %
Eosinophils Absolute: 0.1 10*3/uL (ref 0.0–0.5)
Eosinophils Relative: 1 %
HCT: 25.4 % — ABNORMAL LOW (ref 36.0–46.0)
Hemoglobin: 7.9 g/dL — ABNORMAL LOW (ref 12.0–15.0)
Immature Granulocytes: 1 %
Lymphocytes Relative: 16 %
Lymphs Abs: 1.9 10*3/uL (ref 0.7–4.0)
MCH: 32.4 pg (ref 26.0–34.0)
MCHC: 31.1 g/dL (ref 30.0–36.0)
MCV: 104.1 fL — ABNORMAL HIGH (ref 80.0–100.0)
Monocytes Absolute: 0.8 10*3/uL (ref 0.1–1.0)
Monocytes Relative: 7 %
Neutro Abs: 8.5 10*3/uL — ABNORMAL HIGH (ref 1.7–7.7)
Neutrophils Relative %: 74 %
Platelets: 172 10*3/uL (ref 150–400)
RBC: 2.44 MIL/uL — ABNORMAL LOW (ref 3.87–5.11)
RDW: 23.3 % — ABNORMAL HIGH (ref 11.5–15.5)
WBC: 11.4 10*3/uL — ABNORMAL HIGH (ref 4.0–10.5)
nRBC: 0.5 % — ABNORMAL HIGH (ref 0.0–0.2)

## 2020-02-26 LAB — PROTIME-INR
INR: 2 — ABNORMAL HIGH (ref 0.8–1.2)
Prothrombin Time: 22 seconds — ABNORMAL HIGH (ref 11.4–15.2)

## 2020-02-26 LAB — AMMONIA: Ammonia: 33 umol/L (ref 9–35)

## 2020-02-26 LAB — PHOSPHORUS: Phosphorus: 2.8 mg/dL (ref 2.5–4.6)

## 2020-02-26 LAB — MAGNESIUM: Magnesium: 1.7 mg/dL (ref 1.7–2.4)

## 2020-02-26 MED ORDER — ENSURE ENLIVE PO LIQD: 237.0000 mL | ORAL | Status: DC

## 2020-02-26 MED ORDER — BOOST / RESOURCE BREEZE PO LIQD CUSTOM
1.0000 | ORAL | Status: DC
  Administered 2020-02-28 – 2020-02-29 (×2): 1 via ORAL

## 2020-02-26 NOTE — Progress Notes (Deleted)
PATIENT: Cynthia Frye DOB: [birth year]  REASON FOR VISIT: follow up HISTORY FROM: patient  No chief complaint on file.    HISTORY OF PRESENT ILLNESS:  02/26/20  She returns for migraine follow up. We decreased topiramate to 75mg  at bedtime due to concerns of word finding difficulty at last visit.   Unfortunately, she was diagnosed with Pancreatic cancer on 10/02/2019. She has not tolerated chemotherapy and was recently stopped. She has poor prognosis per oncology. She was hospitalized twice in January for sepsis of unclear origin but also Covid positive with abdominal pain, nausea and body aches.    07/24/2019 ALL:  [name] is a 60 y.o. female here today for follow up for migraines. She continues topiramate 100mg  daily. She has not needed rizatriptan. She has taken Advil 400-600mg  as needed. Usually 1-2 times weekly. She does endorse some word finding concerns that have continued since starting topiramate. She has occasional tingling in her fingers but states this was present prior to starting medication. She denies new or worsening symptoms. No stroke like symptoms. She is followed closely by PCP and rheumatology.   HISTORY: (copied from my note on 04/25/2019)  Cynthia Frye is a 60 y.o. female here today for follow up of migraines.  She was seen 3 days ago by the ER for concerns of paresthesias of the left side of her face.  She felt as if her left lip was a little droopy.  She has chronic left-sided ptosis.  She denies any other concerning or strokelike symptoms.  Work-up was unremarkable.  She reports a high level anxiety as her family has a strong history of stroke.  She is taking topiramate and tolerating well.  She was seen by PCP yesterday who increased dose to 100 mg at bedtime.  He also increase metoprolol to twice daily.  So far she is tolerating these medications well.  She has follow-up with primary care in 2 weeks.  HISTORY: (copied from Dr Cathren Laine note on  04/19/2019)  Cynthia Frye a 60 y.o.femalehere as requested by Josetta Huddle, MDfor migraines. PMHxdizziness, disorders of the eustachian tube, other pancytopenia, cervicalgia, hypertension, disorders of the facial nerve, cervical radiculopathy, myalgia, lightheadedness, thyroid disease, sciatica, obesity, hypersomnia, situational stress, migraine aura persistent, systemic sclerosis, migraine, syncope and collapse. I reviewed Dr. Inda Merlin notes, migraineshave been worsening, possibly due to discontinuation of her nonsteroidal anti-inflammatory medication which was diclofenac, she was taking that chronically twice daily, she was also on a steroid taper for carpal tunnel syndrome, trialed Maxalt, added topiramate at night, and increasedmetoprolol and referred her to [place] for evaluation. She reported severe left-sided throbbing, she has a visual aura scotoma followed by pounding retro-orbital headaches.  She had migraines in the past but only with triggers in the past every month, in January she had her first migraine of the year and she couldn't go to work, she had another one in February, she is seeing her allergy doctor to discuss foods and triggers. Since February she is having them more, Its starts over the eyes unilaterally but spreads and even sensitive to the touch skin on her scalp, she sees heat waves as her aura, if she can feel it coming on and she takes something and gets in a quiet room she may catch it. Associated photophobia/phonophobia, severe pain and can last 4-24, pulsing/pounding/throbbing. She has been out of work for 2 days with a migraine, she is having them every other day now. She went to the ED  it was so bad. She does not wake up with them, not positional, no changes in symptoms. These are her normal migraines just happening more often.  Reviewed notes, labs and imaging from outside physicians, which showed:  Reviewed MRI of the brain images which were negative  for acute pathology, agree with the following:  MRI 04/22/2019:Brain: There is no evidence of acute infarct, intracranial hemorrhage, mass, midline shift, or extra-axial fluid collection. The ventricles and sulci are normal. The brain is normal in signal. No abnormal enhancement is identified.  Vascular: Major intracranial vascular flow voids are preserved.  Skull and upper cervical spine: No suspicious marrow lesion.  Sinuses/Orbits: Unremarkable orbits. Minimal mucosal thickening inferiorly in the maxillary sinuses. Clear mastoid air cells.  Other: None.  IMPRESSION: Unremarkable appearance of the brain  meds tried topamax, metoprolol  BMP normal 2020    REVIEW OF SYSTEMS: Out of a complete 14 system review of symptoms, the patient complains only of the following symptoms, headaches, fatigue and all other reviewed systems are negative.  ALLERGIES: Allergies  Allergen Reactions  . Cinnamon Other (See Comments)    Other reaction(s): Other (See Comments) Unknown  On allergy test Unknown  On allergy test  . Peanut-Containing Drug Products Hives  . Prednisone Other (See Comments)    Interacts with another medicine she is taking  . Corn Oil Hives  . Corn-Containing Products Hives  . Other Rash    Red grapefruit and naval oranges- lips tingling and facial rash Potatoes, tomatoes, garlic, oregano/basil caused headache Other reaction(s): migratory headache    HOME MEDICATIONS: Facility-Administered Medications Prior to Visit  Medication Dose Route Frequency Provider Last Rate Last Admin  . ampicillin (OMNIPEN) 2 g in sodium chloride 0.9 % 100 mL IVPB  2 g Intravenous Q6H Vance Gather B, MD 300 mL/hr at 02/26/20 1218 2 g at 02/26/20 1218  . Chlorhexidine Gluconate Cloth 2 % PADS 6 each  6 each Topical Daily Patrecia Pour, MD   6 each at 02/26/20 1049  . dronabinol (MARINOL) capsule 2.5 mg  2.5 mg Oral BID AC Patrecia Pour, MD   2.5 mg at 02/26/20 0814  .  enoxaparin (LOVENOX) injection 80 mg  80 mg Subcutaneous Q12H Patrecia Pour, MD   80 mg at 02/26/20 1044  . feeding supplement (ENSURE ENLIVE / ENSURE PLUS) liquid 237 mL  237 mL Oral BID BM Patrecia Pour, MD   237 mL at 02/26/20 1000  . guaiFENesin-dextromethorphan (ROBITUSSIN DM) 100-10 MG/5ML syrup 5 mL  5 mL Oral Q4H PRN Elodia Florence., MD   5 mL at 02/22/20 2245  . HYDROmorphone (DILAUDID) injection 0.3-0.5 mg  0.3-0.5 mg Intravenous Q3H PRN Micheline Rough, MD   0.5 mg at 02/26/20 3382  . lactulose (CHRONULAC) 10 GM/15ML solution 20 g  20 g Oral TID Elodia Florence., MD   20 g at 02/26/20 1044  . lip balm (CARMEX) ointment   Topical PRN Elodia Florence., MD   Given at 02/21/20 2214  . LORazepam (ATIVAN) injection 0.5 mg  0.5 mg Intravenous Q4H PRN Patrecia Pour, MD   0.5 mg at 02/20/20 1337  . MEDLINE mouth rinse  15 mL Mouth Rinse BID Patrecia Pour, MD   15 mL at 02/26/20 1050  . metoprolol succinate (TOPROL-XL) 24 hr tablet 50 mg  50 mg Oral Daily Patrecia Pour, MD   50 mg at 02/26/20 1044  . ondansetron (ZOFRAN) injection 4 mg  4 mg Intravenous Q8H PRN Patrecia Pour, MD   4 mg at 02/25/20 0359  . prochlorperazine (COMPAZINE) injection 5 mg  5 mg Intravenous Q6H PRN Patrecia Pour, MD   5 mg at 02/18/20 1247  . sodium chloride flush (NS) 0.9 % injection 10-40 mL  10-40 mL Intracatheter Q12H Elodia Florence., MD   10 mL at 02/26/20 1050  . sodium chloride flush (NS) 0.9 % injection 10-40 mL  10-40 mL Intracatheter PRN Elodia Florence., MD      . sucralfate (CARAFATE) 1 GM/10ML suspension 1 g  1 g Oral TID WC & HS Patrecia Pour, MD   1 g at 02/26/20 1218  . topiramate (TOPAMAX) tablet 50 mg  50 mg Oral Daily PRN Patrecia Pour, MD       Outpatient Medications Prior to Visit  Medication Sig Dispense Refill  . Cholecalciferol (VITAMIN D) 2000 units CAPS Take 2,000 Units by mouth daily.     Marland Kitchen dexamethasone (DECADRON) 4 MG tablet Take 1 tablet (4 mg total) by mouth  daily. Take 1 tablet once daily for 3 to 5 days after chemo 20 tablet 0  . diclofenac (VOLTAREN) 75 MG EC tablet Take 1 tablet (75 mg total) by mouth 2 (two) times daily as needed. (Patient taking differently: Take 75 mg by mouth 2 (two) times daily.)    . dicyclomine (BENTYL) 10 MG capsule Take 1 capsule (10 mg total) by mouth every 6 (six) hours as needed for spasms. 30 capsule 0  . Dietary Management Product (RHEUMATE) CAPS Take 1 capsule by mouth daily.     . diphenoxylate-atropine (LOMOTIL) 2.5-0.025 MG tablet Take 2 tablets by mouth 4 (four) times daily as needed for diarrhea or loose stools (use 2nd, if Imodium doesn't work. Max dose: 8 tablets/day.). 45 tablet 0  . dronabinol (MARINOL) 2.5 MG capsule Take 1 capsule (2.5 mg total) by mouth 2 (two) times daily before a meal. (Patient taking differently: Take 2.5 mg by mouth 2 (two) times daily as needed (nausea).) 30 capsule 0  . enoxaparin (LOVENOX) 80 MG/0.8ML injection Inject 0.8 mLs (80 mg total) into the skin every 12 (twelve) hours. 48 mL 2  . ferrous sulfate 325 (65 FE) MG tablet Take 325 mg by mouth daily with breakfast.    . fluticasone (FLONASE) 50 MCG/ACT nasal spray Place 2 sprays into both nostrils daily. (Patient taking differently: Place 2 sprays into both nostrils daily as needed for allergies.) 16 g 12  . hydroxychloroquine (PLAQUENIL) 200 MG tablet Take 400 mg by mouth daily.   5  . lipase/protease/amylase 24000-76000 units CPEP Take 1 capsule (24,000 Units total) by mouth 3 (three) times daily with meals. (Patient not taking: Reported on 02/17/2020) 270 capsule 0  . loperamide (IMODIUM) 2 MG capsule Take 2 capsules (4 mg total) by mouth 2 (two) times daily as needed for diarrhea or loose stools (Use 1st). 30 capsule 0  . LORazepam (ATIVAN) 0.5 MG tablet Take 1 tablet (0.5 mg total) by mouth every 8 (eight) hours as needed for anxiety (and anticiaptory n/v. DO NOT TAKE WITH XANAX). 30 tablet 0  . losartan-hydrochlorothiazide  (HYZAAR) 50-12.5 MG tablet Take 1 tablet by mouth daily.    . metoprolol succinate (TOPROL-XL) 50 MG 24 hr tablet Take 50 mg by mouth daily.     . mirtazapine (REMERON) 7.5 MG tablet TAKE 1 TABLET BY MOUTH AT BEDTIME. (Patient taking differently: Take 7.5 mg by mouth at bedtime  as needed (sleep).) 90 tablet 1  . OLANZapine (ZYPREXA) 7.5 MG tablet Take 1 tablet (7.5 mg total) by mouth at bedtime. (Patient not taking: No sig reported) 30 tablet 0  . ondansetron (ZOFRAN) 8 MG tablet Take 1 tablet (8 mg total) by mouth every 8 (eight) hours as needed for nausea or vomiting. 30 tablet 3  . oxyCODONE (OXY IR/ROXICODONE) 5 MG immediate release tablet Take 1 tablet (5 mg total) by mouth every 6 (six) hours as needed for severe pain. 60 tablet 0  . senna-docusate (SENOKOT-S) 8.6-50 MG tablet Take 1 tablet by mouth 2 (two) times daily. (Patient taking differently: Take 1 tablet by mouth 2 (two) times daily as needed for mild constipation.) 60 tablet 1  . sterile water SOLN with amino acids 10 % SOLN 1.3 g/kg, dextrose 70 % SOLN 20 % Inject into the vein continuous. Pt on a 12 hour schedule. Last bag 01-31-20. Per husband. [facility].    . sucralfate (CARAFATE) 1 GM/10ML suspension Take 10 mLs (1 g total) by mouth 4 (four) times daily -  with meals and at bedtime. 420 mL 3  . topiramate (TOPAMAX) 50 MG tablet Take 1 tablet (50 mg total) by mouth at bedtime. (Patient taking differently: Take 50 mg by mouth daily as needed (migraines).)      PAST MEDICAL HISTORY: Past Medical History:  Diagnosis Date  . Allergies    peanuts, corn, beans, red grapefruit, naval oranges  . Asthma    allergy shots and medication  . Cancer [facility])    pancreatic  . Collagen vascular disease ([place])   . DDD (degenerative disc disease), lumbar   . Eustachian tube dysfunction 12/2012   rhinitis, vertigo- Dr. Wilburn Cornelia, ENT   . Fibroids   . Heart murmur    Echo 1/18: EF 55-60, no RWMA, normal diastolic  function, trivial AI, PASP 32  . History of cardiac catheterization    LHC 3/18: normal coronary arteries.   . History of nuclear stress test    ETT-Myoview 2/18: EF 62, + ECG response; apical and distal septal ischemia; intermediate risk.  Marland Kitchen Hypertension   . Iron deficiency anemia   . Lupus [facility]) 2011   Dr. Trudie Reed  . Migraine headache    trial of generic maxalt 10 mg, January 2021  . Obesity   . Seasonal allergies   . Seizure in childhood [facility])    as a child no treatment none x 30 years    PAST SURGICAL HISTORY: Past Surgical History:  Procedure Laterality Date  . ABDOMINAL HYSTERECTOMY    . BACK SURGERY  2009  . BIOPSY  12/10/2019   Procedure: BIOPSY;  Surgeon: Wilford Corner, MD;  Location: [facility];  Service: Endoscopy;;  . COLONOSCOPY WITH PROPOFOL N/A 04/11/2012   Procedure: COLONOSCOPY WITH PROPOFOL;  Surgeon: Garlan Fair, MD;  Location: [facility];  Service: Endoscopy;  Laterality: N/A;  . ESOPHAGOGASTRODUODENOSCOPY (EGD) WITH PROPOFOL N/A 12/10/2019   Procedure: ESOPHAGOGASTRODUODENOSCOPY (EGD) WITH PROPOFOL;  Surgeon: Wilford Corner, MD;  Location: [facility];  Service: Endoscopy;  Laterality: N/A;  . HERNIA REPAIR     as child unbilical  . 0000000 partial discectomy and laminectomy     Dr. Christella Noa  . LEFT HEART CATH AND CORONARY ANGIOGRAPHY N/A 04/09/2016   Procedure: Left Heart Cath and Coronary Angiography;  Surgeon: Nelva Bush, MD;  Location: [place] [facility];  Service: Cardiovascular;  Laterality: N/A;  . MOUTH SURGERY    .  PORTACATH PLACEMENT N/A 10/11/2019   Procedure: INSERTION PORT-A-CATH LEFT SUBCLAVIAN;  Surgeon: Almond Lint, MD;  Location: [facility];  Service: General;  Laterality: N/A;    FAMILY HISTORY: Family History  Problem Relation Age of Onset  . Asthma Mother   . Diabetes Mother   . Hypertension Mother   . Heart attack Father 34  . CAD Father   . Stroke Maternal Aunt   . Stroke Paternal Aunt   .  Stroke Paternal Uncle   . Gout Other        uncles   . Cancer Niece        paternal half sister's daughter; unknown type; unknown age diagnosed  . Ovarian cancer Neg Hx   . Breast cancer Neg Hx   . Colon cancer Neg Hx   . Migraines Neg Hx     SOCIAL HISTORY: Social History   Socioeconomic History  . Marital status: Married    Spouse name: Not on file  . Number of children: 0  . Years of education: Not on file  . Highest education level: Some college, no degree  Occupational History  . Occupation: Adult nurse   Tobacco Use  . Smoking status: Never Smoker  . Smokeless tobacco: Never Used  Vaping Use  . Vaping Use: Never used  Substance and Sexual Activity  . Alcohol use: No  . Drug use: No  . Sexual activity: Not on file  Other Topics Concern  . 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 There is no height or weight on file to calculate BMI.  Generalized: Well developed, in no acute distress  Cardiology: normal rate and rhythm, no murmur noted Respiratory: clear to auscultation bilaterally  Neurological examination  Mentation: Alert oriented to time, place, history taking. Follows all commands speech and language fluent Cranial nerve II-XII: Pupils were equal round reactive to light. Extraocular movements were full, visual field were full  Motor: The motor testing reveals 5 over 5 strength of all 4 extremities. Good symmetric motor  tone is noted throughout.  Gait and station: Gait is normal.   DIAGNOSTIC DATA (LABS, IMAGING, TESTING) - I reviewed patient records, labs, notes, testing and imaging myself where available.  No flowsheet data found.   Lab Results  Component Value Date   WBC 11.4 (H) 02/26/2020   HGB 7.9 (L) 02/26/2020   HCT 25.4 (L) 02/26/2020   MCV 104.1 (H) 02/26/2020   PLT 172 02/26/2020      Component Value Date/Time   NA 140 02/26/2020 0412   NA 142 04/02/2016 1122   K 3.5 02/26/2020 0412   CL 105 02/26/2020 0412   CO2 24 02/26/2020 0412   GLUCOSE 89 02/26/2020 0412   BUN 7 02/26/2020 0412   BUN 10 04/02/2016 1122   CREATININE 0.40 (L) 02/26/2020 7341  CREATININE 0.71 01/29/2020 1056   CALCIUM 8.5 (L) 02/26/2020 0412   PROT 7.2 02/26/2020 0412   ALBUMIN 1.9 (L) 02/26/2020 0412   AST 135 (H) 02/26/2020 0412   AST 96 (H) 01/29/2020 1056   ALT 61 (H) 02/26/2020 0412   ALT 69 (H) 01/29/2020 1056   ALKPHOS 248 (H) 02/26/2020 0412   BILITOT 8.2 (H) 02/26/2020 0412   BILITOT 1.7 (H) 01/29/2020 1056   GFRNONAA >60 02/26/2020 0412   GFRNONAA >60 01/29/2020 1056   GFRAA >60 10/28/2019 2115   GFRAA >60 10/25/2019 1224   Lab Results  Component Value Date   TRIG 47 02/05/2020   No results found for: HGBA1C Lab Results  Component Value Date   VITAMINB12 4,009 (H) 02/24/2020   Lab Results  Component Value Date   TSH 2.261 02/24/2020       ASSESSMENT AND PLAN 60 y.o. year old female  has a past medical history of Allergies, Asthma, Cancer ([place]), Collagen vascular disease ([place]), DDD (degenerative disc disease), lumbar, Eustachian tube dysfunction (12/2012), Fibroids, Heart murmur, History of cardiac catheterization, History of nuclear stress test, Hypertension, Iron deficiency anemia, Lupus ([place]) (2011), Migraine headache, Obesity, Seasonal allergies, and Seizure in childhood ([place]). here with   No diagnosis found.   Ms. Thew is doing well today.  Headaches have significantly  improved on topiramate 100 mg daily.  She does note more difficulty with word finding concerns.  I have suggested that she try taking 75 mg(1.5 tablets) to see if this helps.  She may take 75 to 100mg  nightly.  We will continue rizatriptan as needed for abortive therapy.  She was advised against regular use of Advil but may continue dosing 1-2 times weekly.  We have reviewed stroke precautions.  She will continue close follow-up with primary care and rheumatology.  Adequate hydration, healthy well-balanced diet and regular exercise encouraged.  She will follow-up with me in 6 months, sooner if needed.  She verbalizes understanding and agreement with this plan.   No orders of the defined types were placed in this encounter.    No orders of the defined types were placed in this encounter.     I spent 15 minutes with the patient. 50% of this time was spent counseling and educating patient on plan of care and medications.    Debbora Presto, FNP-C 02/26/2020, 1:46 PM [facility] [address] [place], [place] [address] [phone]

## 2020-02-26 NOTE — Progress Notes (Addendum)
Daily Progress Note   Patient Name: Cynthia Frye       Date: 02/26/2020 DOB: 01-08-1961  Age: 60 y.o. MRN#: [id number] Attending Physician: Elodia Florence., * Primary Care Physician: Josetta Huddle, MD Admit Date: 02/17/2020  Reason for Consultation/Follow-up: Establishing goals of care  Subjective: Cynthia Frye is awake alert resting in bed, she has edema, she appears with generalized weakness. She complains of generalized pain, she has been using IV Dilaudid PRN for pain and states that it helps control her pain. she has utilized 4 doses of 0.5 mg each of IV Dilaudid in the past 24 hours.   We continued discussions regarding broad goals of care, CODE STATUS and appropriate disposition options in continuation of family meeting that was done yesterday on 02-25-20 by my colleague Dr. Domingo Cocking.  See below.  Length of Stay: 9  Current Medications: Scheduled Meds:  . Chlorhexidine Gluconate Cloth  6 each Topical Daily  . dronabinol  2.5 mg Oral BID AC  . enoxaparin  80 mg Subcutaneous Q12H  . feeding supplement  237 mL Oral BID BM  . lactulose  20 g Oral TID  . mouth rinse  15 mL Mouth Rinse BID  . metoprolol succinate  50 mg Oral Daily  . sodium chloride flush  10-40 mL Intracatheter Q12H  . sucralfate  1 g Oral TID WC & HS    Continuous Infusions: . ampicillin (OMNIPEN) IV 2 g (02/26/20 0642)    PRN Meds: guaiFENesin-dextromethorphan, HYDROmorphone (DILAUDID) injection, lip balm, LORazepam, ondansetron (ZOFRAN) IV, prochlorperazine, sodium chloride flush, topiramate  Physical Exam        General: Alert, awake, in no acute distress. Chronically ill appearing.   HEENT: No bruits, no goiter, no JVD Heart: Regular rate and rhythm. No murmur appreciated. Lungs: Good air movement,  clear Abdomen: Soft, nontender, nondistended, positive bowel sounds.  Ext: has edema Skin: Warm and dry  Vital Signs: BP 128/76 (BP Location: Right Arm)   Pulse (!) 120   Temp 98.7 F (37.1 C) (Oral)   Resp 18   Ht '5\' 7"'  (1.702 m)   Wt 79.4 kg   SpO2 100%   BMI 27.42 kg/m  SpO2: SpO2: 100 % O2 Device: O2 Device: Room Air O2 Flow Rate:    Intake/output summary:   Intake/Output Summary (Last  24 hours) at 02/26/2020 1027 Last data filed at 02/25/2020 1506 Gross per 24 hour  Intake 400 ml  Output 100 ml  Net 300 ml   LBM: Last BM Date: 02/24/20 Baseline Weight: Weight: 79.4 kg Most recent weight: Weight: 79.4 kg       Palliative Assessment/Data:    Flowsheet Rows   Flowsheet Row Most Recent Value  Intake Tab   Referral Department Hospitalist  Unit at Time of Referral Med/Surg Unit  Palliative Care Primary Diagnosis Cancer  Date Notified 02/18/20  Palliative Care Type Return patient Palliative Care  Reason for referral Non-pain Symptom, Clarify Goals of Care  Date of Admission 02/17/20  Date first seen by Palliative Care 02/19/20  # of days Palliative referral response time 1 Day(s)  # of days IP prior to Palliative referral 1  Clinical Assessment   Palliative Performance Scale Score 30%  Psychosocial & Spiritual Assessment   Palliative Care Outcomes   Patient/Family meeting held? Yes  Who was at the meeting? patient, husband      Patient Active Problem List   Diagnosis Date Noted  . Pancreatic cancer metastasized to liver ([place])   . Pyelonephritis   . Severe sepsis ([place]) 02/17/2020  . COVID-19 virus infection 02/01/2020  . Sepsis ([place]) 02/01/2020  . Biliary obstruction 01/15/2020  . Abdominal pain 01/04/2020  . Dysphagia 12/10/2019  . Malnutrition of moderate degree 11/26/2019  . 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 Constipation 11/25/2019  . Generalized abdominal pain   . Nausea and vomiting in adult 11/24/2019  . Dehydration 11/24/2019   . Prolonged QT interval 11/24/2019  . Emesis   . Tachycardia   . Genetic testing 11/12/2019  . Colitis 11/04/2019  . Acute deep vein thrombosis of left iliac vein (HCC) 11/04/2019  . Hypokalemia 11/04/2019  . Port-A-Cath in place 10/24/2019  . Goals of care, counseling/discussion 10/18/2019  . Pancreatic cancer ([place]) 10/02/2019  . Chronic migraine without aura without status migrainosus, not intractable 04/23/2019  . Migraine with aura and without status migrainosus, not intractable 04/23/2019  . Essential hypertension 04/29/2016  . Lupus (systemic lupus erythematosus) ([place]) 04/29/2016  . Asthma 04/29/2016  . Seasonal allergies 04/29/2016  . Abnormal stress test 04/09/2016    Palliative Care Assessment & Plan   Patient Profile: Cynthia Frye is a 60 year old female with metastatic pancreatic cancer, lupus, asthma, recent COVID-19 infection who presented to the hospital with continued abdominal pain, nausea, and failure to thrive.  CT demonstrates widespread cancer with heterogenous contrast uptake in the kidneys.  She is currently being treated with antibiotics for severe sepsis due to pyelonephritis.  Palliative consulted for goals of care.  Recommendations/Plan: I met today with Cynthia Frye.  Initially she was subdued, did not participate much.  However, later on in the conversation, discussions were held regarding her current condition as well as attempts were made to identify her goals and wishes.  She acknowledges her serious illness and after yesterday's meeting is aware of the irreversible nature of her incurable malignancy.  She states that she did discuss for a little bit with her husband last night.  She states that she has not made up her mind with regards to consideration of hospice.  She asked again about hospice philosophy of care.  We discussed differences between home with hospice versus residential hospice.  All of her questions answered to the best of my ability.  She will  discuss further with her husband, states that they have not made a decision  yet.     Goals of Care and Additional Recommendations: Limitations on Scope of Treatment: Full Scope Treatment  Code Status:    Code Status Orders  (From admission, onward)         Start     Ordered   02/17/20 2319  Full code  Continuous        02/17/20 2318        Code Status History    Date Active Date Inactive Code Status Order ID Comments User Context   02/01/2020 0919 02/07/2020 2350 Full Code [id number]  Jonnie Finner, DO ED   01/15/2020 2037 01/18/2020 1841 Full Code [id number]  Elwyn Reach, MD ED   11/24/2019 1908 12/12/2019 1944 Full Code [id number]  Eugenie Filler, MD Inpatient   11/04/2019 2125 11/09/2019 2207 Full Code [id number]  Rise Patience, MD ED   04/09/2016 1319 04/09/2016 1952 Full Code [id number]  End, Harrell Gave, MD Inpatient   Advance Care Planning Activity      Prognosis: Likely less than 2 weeks in my opinion.     Discharge Planning: To Be Determined-she appears to be getting weaker by the day.  I am worried that she is going to be strong enough to even consider returning home with hospice.  My recommendation today would be to pursue residential hospice for end-of-life care. We discussed again about differences between home with hospice versus residential hospice.   Care plan was discussed with patient  Thank you for allowing the Palliative Medicine Team to assist in the care of this patient.   Time In: 9 Time Out: 9.35 Total Time 35 Prolonged Time Billed No   Greater than 50%  of this time was spent counseling and coordinating care related to the above assessment and plan.  Loistine Chance, MD  Please contact Palliative Medicine Team phone at [phone] for questions and concerns.

## 2020-02-26 NOTE — Progress Notes (Signed)
Nutrition Follow-up  DOCUMENTATION CODES:   Not applicable  INTERVENTION:  - decrease Ensure Enlive from BID to once/day, each supplement provides 350 kcal and 20 grams protein. . - will order Boost Breeze once/day, each supplement provides 250 kcal and 9 grams of protein. - weight patient today.    NUTRITION DIAGNOSIS:   Increased nutrient needs related to cancer and cancer related treatments as evidenced by estimated needs. -ongoing  GOAL:   Patient will meet greater than or equal to 90% of their needs -unmet  MONITOR:   PO intake,Supplement acceptance,Labs,Skin  ASSESSMENT:   59 YOF with PMH of pancreatic cancer s/p chemotherapy and recent COVID infection. Pt presented to the ED with abdominal pain, nausea and failure to thrive. Pt was admitted for sepsis of unclear origin  Intakes have been variable over the past 1 week: 0-60%. She has been accepting Ensure ~30% of the time offered.   She has not been weighed since admission on 1/23. Non-pitting edema to all extremities documented in the edema section of flow sheet.   Palliative Care followed up with patient today. She remains Full Code. Plan for ongoing discussions with patient and her husband related to GOC. Palliative Care MD states in note that prognosis is anticipated to be <2 weeks.    Labs reviewed; creatinine: 0.4 mg/dl, Ca: 8.5 mg/dl, Alk Phos elevated, LFTs elevated. Medications reviewed; 2.5 mg marinol BID, 20 g lactulose TID, 1 g carafate TID.   Diet Order:   Diet Order            Diet regular Room service appropriate? Yes; Fluid consistency: Thin  Diet effective now                 EDUCATION NEEDS:   No education needs have been identified at this time  Skin:  Skin Assessment: Reviewed RN Assessment  Last BM:  1/31 (type 7 x2)  Height:   Ht Readings from Last 1 Encounters:  02/17/20 5' 7" (1.702 m)    Weight:   Wt Readings from Last 1 Encounters:  02/17/20 79.4 kg     Estimated  Nutritional Needs:  Kcal:  2200-2400 Protein:  120-135 Fluid:  >2.1 L      , MS, RD, LDN, CNSC Inpatient Clinical Dietitian RD pager # available in [facility]  After hours/weekend pager # available in [facility]  

## 2020-02-26 NOTE — Progress Notes (Addendum)
Daily Progress Note   Patient Name: Cynthia Frye       Date: 02/26/2020 DOB: 1960/06/26  Age: 60 y.o. MRN#: [id number] Attending Physician: Elodia Florence., * Primary Care Physician: Josetta Huddle, MD Admit Date: 02/17/2020  Reason for Consultation/Follow-up: Establishing goals of care  Subjective: I saw and examined Cynthia Frye.  Her husband is at the bedside.  I met with Cynthia Frye and her husband in conjunction with Dr. Florene Glen and Dr. Burr Medico for a family meeting.  We discussed continued progression of her disease and high symptom burden.  Recommendation was made for hospice care with strong consideration for residential hospice.  See below.  Length of Stay: 9  Current Medications: Scheduled Meds:  . Chlorhexidine Gluconate Cloth  6 each Topical Daily  . dronabinol  2.5 mg Oral BID AC  . enoxaparin  80 mg Subcutaneous Q12H  . feeding supplement  237 mL Oral BID BM  . lactulose  20 g Oral TID  . mouth rinse  15 mL Mouth Rinse BID  . metoprolol succinate  50 mg Oral Daily  . sodium chloride flush  10-40 mL Intracatheter Q12H  . sucralfate  1 g Oral TID WC & HS    Continuous Infusions: . ampicillin (OMNIPEN) IV 2 g (02/25/20 2320)    PRN Meds: guaiFENesin-dextromethorphan, HYDROmorphone (DILAUDID) injection, lip balm, LORazepam, ondansetron (ZOFRAN) IV, prochlorperazine, sodium chloride flush, topiramate  Physical Exam        General: Alert, awake, in no acute distress. Chronically ill appearing.  Sleepier and more confused HEENT: No bruits, no goiter, no JVD Heart: Regular rate and rhythm. No murmur appreciated. Lungs: Good air movement, clear Abdomen: Soft, nontender, nondistended, positive bowel sounds.  Ext: No significant edema Skin: Warm and dry  Vital Signs:  BP 131/88 (BP Location: Left Arm)   Pulse (!) 117   Temp 98.5 F (36.9 C) (Oral)   Resp 16   Ht '5\' 7"'  (1.702 m)   Wt 79.4 kg   SpO2 99%   BMI 27.42 kg/m  SpO2: SpO2: 99 % O2 Device: O2 Device: Room Air O2 Flow Rate:    Intake/output summary:   Intake/Output Summary (Last 24 hours) at 02/26/2020 0524 Last data filed at 02/25/2020 1506 Gross per 24 hour  Intake 560 ml  Output 250 ml  Net 310 ml   LBM: Last BM Date: 02/24/20 Baseline Weight: Weight: 79.4 kg Most recent weight: Weight: 79.4 kg       Palliative Assessment/Data:    Flowsheet Rows   Flowsheet Row Most Recent Value  Intake Tab   Referral Department Hospitalist  Unit at Time of Referral Med/Surg Unit  Palliative Care Primary Diagnosis Cancer  Date Notified 02/18/20  Palliative Care Type Return patient Palliative Care  Reason for referral Non-pain Symptom, Clarify Goals of Care  Date of Admission 02/17/20  Date first seen by Palliative Care 02/19/20  # of days Palliative referral response time 1 Day(s)  # of days IP prior to Palliative referral 1  Clinical Assessment   Palliative Performance Scale Score 30%  Psychosocial & Spiritual Assessment   Palliative Care Outcomes   Patient/Family meeting held? Yes  Who was at the meeting? patient, husband      Patient Active Problem List   Diagnosis Date Noted  . Pancreatic cancer metastasized to liver ([place])   . Pyelonephritis   . Severe sepsis ([place]) 02/17/2020  . COVID-19 virus infection 02/01/2020  . Sepsis ([place]) 02/01/2020  . Biliary obstruction 01/15/2020  . Abdominal pain 01/04/2020  . Dysphagia 12/10/2019  . Malnutrition of moderate degree 11/26/2019  . 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 Constipation 11/25/2019  . Generalized abdominal pain   . Nausea and vomiting in adult 11/24/2019  . Dehydration 11/24/2019  . Prolonged QT interval 11/24/2019  . Emesis   . Tachycardia   . Genetic testing 11/12/2019  . Colitis 11/04/2019  .  Acute deep vein thrombosis of left iliac vein (HCC) 11/04/2019  . Hypokalemia 11/04/2019  . Port-A-Cath in place 10/24/2019  . Goals of care, counseling/discussion 10/18/2019  . Pancreatic cancer ([place]) 10/02/2019  . Chronic migraine without aura without status migrainosus, not intractable 04/23/2019  . Migraine with aura and without status migrainosus, not intractable 04/23/2019  . Essential hypertension 04/29/2016  . Lupus (systemic lupus erythematosus) ([place]) 04/29/2016  . Asthma 04/29/2016  . Seasonal allergies 04/29/2016  . Abnormal stress test 04/09/2016    Palliative Care Assessment & Plan   Patient Profile: Cynthia Frye is a 60 year old female with metastatic pancreatic cancer, lupus, asthma, recent COVID-19 infection who presented to the hospital with continued abdominal pain, nausea, and failure to thrive.  CT demonstrates widespread cancer with heterogenous contrast uptake in the kidneys.  She is currently being treated with antibiotics for severe sepsis due to pyelonephritis.  Palliative consulted for goals of care.  Recommendations/Plan: Full code/full scope I met today with Cynthia Frye and her husband in conjunction with Dr. Florene Glen and Dr Burr Medico.  We discussed her continued progression of liver failure and continued decline in nutrition and functional status.  Recommendation was made for hospice care moving forward.  With her very limited prognosis and high symptom burden, I recommended that I believe she would best be served by residential hospice the time of discharge from the hospital. We also discussed heroic interventions the end of life.  Recommendation was made for avoiding heroic interventions at the time of respiratory or cardiac arrest due to her significant underlying cancer and the fact that heroic interventions are not likely to result in her ever getting well enough to leave the hospital or have meaningful time with family while adding significant potential for suffering  without any benefit. While we are in the room, patient declined to discuss prognosis.  Her husband stopped Dr. Florene Glen and by in the hall after  meeting and we discussed my concern that we are likely looking at a prognosis of weeks at best.  He seems surprised but accepting of this information. Palliative to follow-up tomorrow to see if I can for set up a joint meeting with myself, Dr. Burr Medico, and Dr. Florene Glen.   Goals of Care and Additional Recommendations: Limitations on Scope of Treatment: Full Scope Treatment  Code Status:    Code Status Orders  (From admission, onward)         Start     Ordered   02/17/20 2319  Full code  Continuous        02/17/20 2318        Code Status History    Date Active Date Inactive Code Status Order ID Comments User Context   02/01/2020 0919 02/07/2020 2350 Full Code [id number]  Jonnie Finner, DO ED   01/15/2020 2037 01/18/2020 1841 Full Code [id number]  Elwyn Reach, MD ED   11/24/2019 1908 12/12/2019 1944 Full Code [id number]  Eugenie Filler, MD Inpatient   11/04/2019 2125 11/09/2019 2207 Full Code [id number]  Rise Patience, MD ED   04/09/2016 1319 04/09/2016 1952 Full Code [id number]  End, Harrell Gave, MD Inpatient   Advance Care Planning Activity      Prognosis: Her hepatic failure continues to progress.  She is not really taking in any meaningful nutrition or hydration.  I think when the time comes that she leaves the hospital, her prognosis will be significantly limited.  I told her husband today that I feel that I feel her prognosis is likely 2 weeks and my recommendation would be for residential hospice.  Discharge Planning: To Be Determined-she appears to be getting weaker by the day.  I am worried that she is going to be strong enough to even consider returning home with hospice.  My recommendation today would be to pursue residential hospice for end-of-life care.  Care plan was discussed with patient, husband, Dr. Florene Glen, Dr.  Burr Medico  Thank you for allowing the Palliative Medicine Team to assist in the care of this patient.   Time In: 1230 Time Out: 115 Total Time 45 Prolonged Time Billed No   Greater than 50%  of this time was spent counseling and coordinating care related to the above assessment and plan.  Micheline Rough, MD  Please contact Palliative Medicine Team phone at [phone] for questions and concerns.

## 2020-02-26 NOTE — Progress Notes (Addendum)
PROGRESS NOTE    CARL BLEECKER  QTM:[id number] DOB: Jun 28, 1960 DOA: 02/17/2020 PCP: Josetta Huddle, MD   Chief Complaint  Patient presents with  . Weakness  . Dehydration   Brief Narrative:  [name] a59 y.o.female,with medical history of pancreatic cancer s/p chemotherapy, lupus, asthma, recent COVID-19 infection,which was treated with remdesivir in the hospital for 3 days during an admission for sepsis of unclear origin who has continued to have abdominal pain, nausea, and failure to thrive at home prompting return to the ED 1/23. She was tachycardic, tachypneic with CT abd/pelvis demonstrating known widespread cancer and mild heterogenous contrast uptake in kidneys bilaterally. She was given broad spectrum antibiotics and sepsis-dose IV fluids and admitted to SDU.  She was admitted with pyelo.  S/p treatment with antibiotics.  She's declined with worsening liver function in the setting of her metastatic pancreatic cancer.  Currently goals of care discussions occurring at this time.   Assessment & Plan:   Active Problems:   Severe sepsis [facility])   Pancreatic cancer metastasized to liver [facility])   Pyelonephritis  Goals of care discussion: The patient has advanced cancer which has progressed despite treatment (which she has tolerated poorly), now no longer on treatment of any kind. Hospice has been recommended.  - goals of care meeting this AM (1/31) with Dr. Burr Medico and Dr. Domingo Cocking, appreciate their assistance -> due to her progressive liver function, encephalopathy, and poor overall prognosis, recommended inpatient hospice (discussion on 1/31).  Appreciate assistance of Dr. Domingo Cocking and Dr. Burr Medico.  Mr. Byrns and [place] are processing our discussion, they haven't made [facility] decision yet - appreciate palliative care assistance.  - appreciate palliative care assistance with symptom management - appears that she's rapidly declining, I doubt she'll be able to d/c home at this point as they  initially planned.  Discussed with pt and husband today (husband seems to understand, pt seems to still have some denial when she's engaged).  Acute Metabolic Encephalopathy  Hepatic Encephalopathy: mental status seems improved on lactulose recently.  Ammonia improved, asterixis is improved.  Encephalopathy seems improved.  Suspect this is hepatic encephalopathy due to her progressive liver failure.  Meds could also contribute, but she still complains of pain.  Lactulose, continue to monitor   Hypoalbuminemia  Anasarca: will give albumin, continue to monitor.   Mild amount of ascitics on [place]  Severe sepsis due to pyelonephritis: Diagnosed at admission based on tachycardia, tachypnea, abnormalities of kidneys on CT abdomen.  - Febrile 1/24. Urine culture growing Enterococcus faecalis > susceptibility to ampicillin (1/25 - 2/1 - currently on day 10 when including vanc - discussed with pharmacy) plan for 10 days - Follow blood cultures, repeat cultures now since febrile.  Recent covid-19 infection: +SARS-CoV-2 testing 1/7. s/p Pfizer July, Aug 2021. Treated with some remdesivir, never hypoxic to require steroids. Has completed 10 days of isolation.  - DC isolation.  Acute left CFV DVT, chronic right-sided pulmonary emboli: Left iliac vein DVT (10/2019), DVT in the left peroneal and gastrocnemius veins (01/07/2020), DVT in the left common femoral vein (02/01/2020) - Lovenox 1mg /kg q12h  Hyponatremia:  - d/c LR in setting of anasarca  Hypokalemia: replace and follow  Nausea, vomiting, intolerance to enteral feeding, GERD, severe protein calorie malnutrition: Prealbumin undetectable at recent hospitalization. - Maximize antiemetics, hold TPN for now.   Metastatic pancreatic cancer, elevated LFTs  Liver Failure: Progressive despite chemotherapy which is also poorly tolerated. No longer on chemotherapy, [place] conversations as noted above.  - bilirubin rising,  8.2 today, INR 2.  AST/ALT  relatively stable. continue to follow (no intrahepatic or extrahepatic ductal dilatation on 1/23 CT scan) -> she has likely hepatic encephalopathy, progressive liver failure - prognosis is poor - discussed with Dr. Burr Medico - likely 2/2 metastatic pancreatic cancer - Palliative care consulted  Pancytopenia, anemia of malignancy:  - fluctuating Hb, continue to monitor - 7.9 today - transfuse for <7  Thrombocytopenia: resolved  SLE: No flare. Hold home meds for now  HTN:  - Restart metoprolol. Hold other home meds with softer BPs on admit. Restart when rising BP.  DVT prophylaxis: lovenox Code Status: full  Family Communication: husband 1/31 Disposition:   Status is: Inpatient  Remains inpatient appropriate because:Inpatient level of care appropriate due to severity of illness   Dispo: The patient is from: Home              Anticipated d/c is to: Home              Anticipated d/c date is: > 3 days              Patient currently is not medically stable to d/c.   Difficult to place patient No       Consultants:   Palliative care  oncology  Procedures:  none  Antimicrobials:  Anti-infectives (From admission, onward)   Start     Dose/Rate Route Frequency Ordered Stop   02/19/20 1315  ampicillin (OMNIPEN) 2 g in sodium chloride 0.9 % 100 mL IVPB        2 g 300 mL/hr over 20 Minutes Intravenous Every 6 hours 02/19/20 1218 02/27/20 0559   02/18/20 1400  cefTRIAXone (ROCEPHIN) 2 g in sodium chloride 0.9 % 100 mL IVPB  Status:  Discontinued        2 g 200 mL/hr over 30 Minutes Intravenous Every 24 hours 02/18/20 0644 02/19/20 1218   02/18/20 0200  vancomycin (VANCOCIN) IVPB 1000 mg/200 mL premix  Status:  Discontinued        1,000 mg 200 mL/hr over 60 Minutes Intravenous Every 12 hours 02/17/20 1847 02/18/20 0644   02/17/20 2100  ceFEPIme (MAXIPIME) 2 g in sodium chloride 0.9 % 100 mL IVPB  Status:  Discontinued        2 g 200 mL/hr over 30 Minutes Intravenous Every 8  hours 02/17/20 1841 02/18/20 0644   02/17/20 1400  vancomycin (VANCOREADY) IVPB 2000 mg/400 mL        2,000 mg 200 mL/hr over 120 Minutes Intravenous  Once 02/17/20 1308 02/17/20 2011   02/17/20 1300  ceFEPIme (MAXIPIME) 2 g in sodium chloride 0.9 % 100 mL IVPB        2 g 200 mL/hr over 30 Minutes Intravenous  Once 02/17/20 1251 02/17/20 1350   02/17/20 1300  metroNIDAZOLE (FLAGYL) IVPB 500 mg        500 mg 100 mL/hr over 60 Minutes Intravenous  Once 02/17/20 1251 02/17/20 1502      Subjective: C/o abdominal discomofrt, asking for pain meds  Objective: Vitals:   02/26/20 0453 02/26/20 0922 02/26/20 1224 02/26/20 1651  BP: 131/88 128/76 137/85 (!) 139/93  Pulse: (!) 117 (!) 120 (!) 117 (!) 109  Resp: 16 18 19 18   Temp: 98.5 F (36.9 C) 98.7 F (37.1 C) 98.7 F (37.1 C) 99 F (37.2 C)  TempSrc: Oral Oral Oral Oral  SpO2: 99% 100% 99% 98%  Weight:      Height:  Intake/Output Summary (Last 24 hours) at 02/26/2020 1959 Last data filed at 02/26/2020 1520 Gross per 24 hour  Intake 200 ml  Output --  Net 200 ml   Filed Weights   02/17/20 2345  Weight: 79.4 kg    Examination:  General: No acute distress. Cardiovascular: RRR Lungs: unlabored Abdomen: Soft, mildly distended, mild abdominal discomfort Neurological: Alert and oriented 3. Moves all extremities 4. Cranial nerves II through XII grossly intact.  No asterixis Skin: Warm and dry. No rashes or lesions. Extremities: anasarca  Data Reviewed: I have personally reviewed following labs and imaging studies  CBC: Recent Labs  Lab 02/22/20 0549 02/23/20 0435 02/24/20 0335 02/25/20 0400 02/26/20 0412  WBC 8.3 8.2 9.0 10.6* 11.4*  NEUTROABS 6.1 6.0 6.9 8.0* 8.5*  HGB 7.7* 7.6* 8.0* 8.1* 7.9*  HCT 23.9* 23.6* 24.2* 25.4* 25.4*  MCV 98.4 98.7 100.8* 102.0* 104.1*  PLT 176 174 185 195 [id number]    Basic Metabolic Panel: Recent Labs  Lab 02/22/20 0549 02/23/20 0435 02/24/20 0335 02/25/20 0400 02/26/20 0412   NA 133* 136 135 137 140  K 3.3* 2.9* 3.0* 3.3* 3.5  CL 100 104 101 103 105  CO2 22 25 24 25 24   GLUCOSE 89 93 93 83 89  BUN 7 <5* <5* <5* 7  CREATININE 0.47 0.44 0.42* 0.42* 0.40*  CALCIUM 8.0* 7.9* 8.2* 8.4* 8.5*  MG 1.6* 1.6* 1.7 1.7 1.7  PHOS 3.1 2.4* 2.4* 2.4* 2.8    GFR: Estimated Creatinine Clearance: 83.1 mL/min (Declynn Lopresti) (by C-G formula based on SCr of 0.4 mg/dL (L)).  Liver Function Tests: Recent Labs  Lab 02/22/20 0549 02/23/20 0435 02/24/20 0335 02/25/20 0400 02/26/20 0412  AST 137* 128* 131* 132* 135*  ALT 73* 67* 65* 65* 61*  ALKPHOS 353* 301* 269* 271* 248*  BILITOT 7.6* 7.4* 8.8* 8.1* 8.2*  PROT 7.0 6.5 6.9 7.2 7.2  ALBUMIN 1.7* 1.5* 2.1* 1.9* 1.9*    CBG: Recent Labs  Lab 02/23/20 0012 02/23/20 0457  GLUCAP 79 93     Recent Results (from the past 240 hour(s))  Blood Culture (routine x 2)     Status: None   Collection Time: 02/17/20  1:00 PM   Specimen: BLOOD  Result Value Ref Range Status   Specimen Description   Final    BLOOD SITE NOT SPECIFIED Performed at [facility], [address], [place], [place] [address]    Special Requests   Final    BOTTLES DRAWN AEROBIC AND ANAEROBIC Blood Culture adequate volume Performed at [facility] [address], [place], [place] [address]    Culture   Final    NO GROWTH 5 DAYS Performed at [facility], [place] [address] [place], [place] [address]    Report Status 02/22/2020 FINAL  Final  Urine culture     Status: Abnormal   Collection Time: 02/17/20  8:20 PM   Specimen: In/Out Cath Urine  Result Value Ref Range Status   Specimen Description   Final    IN/OUT CATH URINE Performed at [facility] [address], [place], [place] [address]    Special Requests   Final    NONE Performed at [facility], [place] [address], [place], [place] [address]    Culture 40,000 COLONIES/mL ENTEROCOCCUS FAECALIS (Milah Recht)  Final   Report Status  02/20/2020 FINAL  Final   Organism ID, Bacteria ENTEROCOCCUS FAECALIS (Angelica Wix)  Final      Susceptibility  Enterococcus faecalis - MIC*    AMPICILLIN <=2 SENSITIVE Sensitive     NITROFURANTOIN <=16 SENSITIVE Sensitive     VANCOMYCIN 1 SENSITIVE Sensitive     * 40,000 COLONIES/mL ENTEROCOCCUS FAECALIS  MRSA PCR Screening     Status: None   Collection Time: 02/17/20 11:18 PM   Specimen: Nasal Mucosa; Nasopharyngeal  Result Value Ref Range Status   MRSA by PCR NEGATIVE NEGATIVE Final    Comment:        The GeneXpert MRSA Assay (FDA approved for NASAL specimens only), is one component of Kelliann Pendergraph comprehensive MRSA colonization surveillance program. It is not intended to diagnose MRSA infection nor to guide or monitor treatment for MRSA infections. Performed at [facility], [place] [address], [place], [place] [address]   Culture, blood (routine x 2)     Status: None   Collection Time: 02/19/20  1:27 PM   Specimen: BLOOD  Result Value Ref Range Status   Specimen Description   Final    BLOOD RIGHT ANTECUBITAL Performed at [facility] [address], [place], [place] [address]    Special Requests   Final    BOTTLES DRAWN AEROBIC ONLY Blood Culture results may not be optimal due to an inadequate volume of blood received in culture bottles Performed at [facility] [address] [place], [place] [address]    Culture   Final    NO GROWTH 5 DAYS Performed at [facility], [place] [address], [place], [place] [address]    Report Status 02/24/2020 FINAL  Final  Culture, blood (routine x 2)     Status: None   Collection Time: 02/19/20  1:34 PM   Specimen: BLOOD RIGHT HAND  Result Value Ref Range Status   Specimen Description   Final    BLOOD RIGHT HAND Performed at [facility] [address] [place], [place] [address]    Special Requests   Final    BOTTLES DRAWN AEROBIC ONLY Blood Culture results may not be  optimal due to an inadequate volume of blood received in culture bottles Performed at [facility] [address] [place], [place] [address]    Culture   Final    NO GROWTH 5 DAYS Performed at [facility], [place] [address] [place], [place] [address]    Report Status 02/24/2020 FINAL  Final         Radiology Studies: No results found.      Scheduled Meds: . Chlorhexidine Gluconate Cloth  6 each Topical Daily  . dronabinol  2.5 mg Oral BID AC  . enoxaparin  80 mg Subcutaneous Q12H  . [START ON 02/27/2020] feeding supplement  1 Container Oral Q24H  . feeding supplement  237 mL Oral Q24H  . lactulose  20 g Oral TID  . mouth rinse  15 mL Mouth Rinse BID  . metoprolol succinate  50 mg Oral Daily  . sodium chloride flush  10-40 mL Intracatheter Q12H  . sucralfate  1 g Oral TID WC & HS   Continuous Infusions: . ampicillin (OMNIPEN) IV 2 g (02/26/20 1757)     LOS: 9 days    Time spent: over 30 min    Fayrene Helper, MD [facility]   To contact the attending provider between 7A-7P or the covering provider during after hours 7P-7A, please log into the web site [email or web address] and access using universal [place] password for that web site. If  you do not have the password, please call the hospital operator.  02/26/2020, 7:59 PM

## 2020-02-27 DIAGNOSIS — Z515 Encounter for palliative care: Secondary | ICD-10-CM | POA: Diagnosis not present

## 2020-02-27 DIAGNOSIS — R531 Weakness: Secondary | ICD-10-CM | POA: Diagnosis not present

## 2020-02-27 DIAGNOSIS — C787 Secondary malignant neoplasm of liver and intrahepatic bile duct: Secondary | ICD-10-CM | POA: Diagnosis not present

## 2020-02-27 DIAGNOSIS — N12 Tubulo-interstitial nephritis, not specified as acute or chronic: Secondary | ICD-10-CM | POA: Diagnosis not present

## 2020-02-27 DIAGNOSIS — A419 Sepsis, unspecified organism: Secondary | ICD-10-CM | POA: Diagnosis not present

## 2020-02-27 DIAGNOSIS — C259 Malignant neoplasm of pancreas, unspecified: Secondary | ICD-10-CM | POA: Diagnosis not present

## 2020-02-27 DIAGNOSIS — Z7189 Other specified counseling: Secondary | ICD-10-CM | POA: Diagnosis not present

## 2020-02-27 LAB — COMPREHENSIVE METABOLIC PANEL
ALT: 60 U/L — ABNORMAL HIGH (ref 0–44)
AST: 134 U/L — ABNORMAL HIGH (ref 15–41)
Albumin: 1.7 g/dL — ABNORMAL LOW (ref 3.5–5.0)
Alkaline Phosphatase: 224 U/L — ABNORMAL HIGH (ref 38–126)
Anion gap: 10 (ref 5–15)
BUN: 8 mg/dL (ref 6–20)
CO2: 24 mmol/L (ref 22–32)
Calcium: 8.3 mg/dL — ABNORMAL LOW (ref 8.9–10.3)
Chloride: 103 mmol/L (ref 98–111)
Creatinine, Ser: 0.5 mg/dL (ref 0.44–1.00)
GFR, Estimated: >60 mL/min (ref >60)
Glucose, Bld: 91 mg/dL (ref 70–99)
Potassium: 3.2 mmol/L — ABNORMAL LOW (ref 3.5–5.1)
Sodium: 137 mmol/L (ref 135–145)
Total Bilirubin: 7.7 mg/dL — ABNORMAL HIGH (ref 0.3–1.2)
Total Protein: 7 g/dL (ref 6.5–8.1)

## 2020-02-27 LAB — CBC WITH DIFFERENTIAL/PLATELET
Abs Immature Granulocytes: 0.07 10*3/uL (ref 0.00–0.07)
Basophils Absolute: 0 10*3/uL (ref 0.0–0.1)
Basophils Relative: 0 %
Eosinophils Absolute: 0.1 10*3/uL (ref 0.0–0.5)
Eosinophils Relative: 1 %
HCT: 25.1 % — ABNORMAL LOW (ref 36.0–46.0)
Hemoglobin: 7.9 g/dL — ABNORMAL LOW (ref 12.0–15.0)
Immature Granulocytes: 1 %
Lymphocytes Relative: 11 %
Lymphs Abs: 1.3 10*3/uL (ref 0.7–4.0)
MCH: 32.5 pg (ref 26.0–34.0)
MCHC: 31.5 g/dL (ref 30.0–36.0)
MCV: 103.3 fL — ABNORMAL HIGH (ref 80.0–100.0)
Monocytes Absolute: 0.9 10*3/uL (ref 0.1–1.0)
Monocytes Relative: 7 %
Neutro Abs: 9.4 10*3/uL — ABNORMAL HIGH (ref 1.7–7.7)
Neutrophils Relative %: 80 %
Platelets: 164 10*3/uL (ref 150–400)
RBC: 2.43 MIL/uL — ABNORMAL LOW (ref 3.87–5.11)
RDW: 22.9 % — ABNORMAL HIGH (ref 11.5–15.5)
WBC: 11.7 10*3/uL — ABNORMAL HIGH (ref 4.0–10.5)
nRBC: 0.4 % — ABNORMAL HIGH (ref 0.0–0.2)

## 2020-02-27 LAB — PROTIME-INR
INR: 2.2 — ABNORMAL HIGH (ref 0.8–1.2)
Prothrombin Time: 23.3 seconds — ABNORMAL HIGH (ref 11.4–15.2)

## 2020-02-27 LAB — MAGNESIUM: Magnesium: 1.7 mg/dL (ref 1.7–2.4)

## 2020-02-27 LAB — AMMONIA: Ammonia: 28 umol/L (ref 9–35)

## 2020-02-27 LAB — PHOSPHORUS: Phosphorus: 2.4 mg/dL — ABNORMAL LOW (ref 2.5–4.6)

## 2020-02-27 NOTE — Hospital Course (Addendum)
Cynthia Frye is a 60 y.o. female, with medical history of pancreatic cancer s/p chemotherapy, lupus, asthma, recent COVID-19 infection, which was treated with remdesivir in the hospital for 3 days during an admission for sepsis of unclear origin who has continued to have abdominal pain, nausea, and failure to thrive at home prompting return to the ED 1/23.  She was tachycardic, tachypneic with CT abd/pelvis demonstrating known widespread cancer and mild heterogenous contrast uptake in kidneys bilaterally.   Patient was evaluated by oncology and palliative care during hospitalization.  She was considered to have progressive advanced cancer and tolerated treatment poorly in the past, therefore was considered to no longer be a further treatment candidate.  Hospice was recommended for consideration.  Goals of care were discussed and patient and her husband agreed upon transitioning to comfort care/hospice.  She was [facility] to [facility].   Other notable conditions addressed while in the hospital include: Pyelonephritis, completed 10 days antibiotics in the hospital COVID-19 infection, positive on 02/01/2020.  Treated with remdesivir, completed 10 days isolation. Left common femoral vein DVT, chronic PE.  Was treated with Lovenox in the hospital which was not continued at discharge given pursuit of comfort care.

## 2020-02-27 NOTE — Progress Notes (Signed)
Daily Progress Note   Patient Name: Cynthia Frye       Date: 02/27/2020 DOB: 03-14-1960  Age: 60 y.o. MRN#: [id number] Attending Physician: Dwyane Dee, MD Primary Care Physician: Josetta Huddle, MD Admit Date: 02/17/2020  Reason for Consultation/Follow-up: Establishing goals of care  Subjective: Cynthia Frye is awake alert resting in bed, she has edema, she appears with generalized weakness. She states that her husband has gone to visit [facility].    Length of Stay: 10  Current Medications: Scheduled Meds:  . Chlorhexidine Gluconate Cloth  6 each Topical Daily  . dronabinol  2.5 mg Oral BID AC  . enoxaparin  80 mg Subcutaneous Q12H  . feeding supplement  1 Container Oral Q24H  . feeding supplement  237 mL Oral Q24H  . lactulose  20 g Oral TID  . mouth rinse  15 mL Mouth Rinse BID  . metoprolol succinate  50 mg Oral Daily  . sodium chloride flush  10-40 mL Intracatheter Q12H  . sucralfate  1 g Oral TID WC & HS    Continuous Infusions:   PRN Meds: guaiFENesin-dextromethorphan, HYDROmorphone (DILAUDID) injection, lip balm, LORazepam, ondansetron (ZOFRAN) IV, prochlorperazine, sodium chloride flush, topiramate  Physical Exam        General: Alert, awake, in no acute distress. Chronically ill appearing.   HEENT: No bruits, no goiter, no JVD Heart: Regular rate and rhythm. No murmur appreciated. Lungs: Good air movement, clear Abdomen: Soft, nontender, nondistended, positive bowel sounds.  Ext: has edema Skin: Warm and dry  Vital Signs: BP (!) 147/82 (BP Location: Right Arm)   Pulse (!) 120   Temp 98.7 F (37.1 C) (Oral)   Resp 18   Ht 5\' 7"  (1.702 m)   Wt 79.4 kg   SpO2 97%   BMI 27.42 kg/m  SpO2: SpO2: 97 % O2 Device: O2 Device:  Room Air O2 Flow Rate:    Intake/output summary:   Intake/Output Summary (Last 24 hours) at 02/27/2020 1317 Last data filed at 02/26/2020 1520 Gross per 24 hour  Intake 200 ml  Output --  Net 200 ml   LBM: Last BM Date: 02/25/20 Baseline Weight: Weight: 79.4 kg Most recent weight: Weight: 79.4 kg       Palliative Assessment/Data:    Flowsheet Rows   Flowsheet  Row Most Recent Value  Intake Tab   Referral Department Hospitalist  Unit at Time of Referral Med/Surg Unit  Palliative Care Primary Diagnosis Cancer  Date Notified 02/18/20  Palliative Care Type Return patient Palliative Care  Reason for referral Non-pain Symptom, Clarify Goals of Care  Date of Admission 02/17/20  Date first seen by Palliative Care 02/19/20  # of days Palliative referral response time 1 Day(s)  # of days IP prior to Palliative referral 1  Clinical Assessment   Palliative Performance Scale Score 30%  Psychosocial & Spiritual Assessment   Palliative Care Outcomes   Patient/Family meeting held? Yes  Who was at the meeting? patient, husband      Patient Active Problem List   Diagnosis Date Noted  . Pancreatic cancer metastasized to liver ([place])   . Pyelonephritis   . Severe sepsis ([place]) 02/17/2020  . COVID-19 virus infection 02/01/2020  . Sepsis ([place]) 02/01/2020  . Biliary obstruction 01/15/2020  . Abdominal pain 01/04/2020  . Dysphagia 12/10/2019  . Malnutrition of moderate degree 11/26/2019  . 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 Constipation 11/25/2019  . Generalized abdominal pain   . Nausea and vomiting in adult 11/24/2019  . Dehydration 11/24/2019  . Prolonged QT interval 11/24/2019  . Emesis   . Tachycardia   . Genetic testing 11/12/2019  . Colitis 11/04/2019  . Acute deep vein thrombosis of left iliac vein ([facility]) 11/04/2019  . Hypokalemia 11/04/2019  . Port-A-Cath in place 10/24/2019  . Goals of care, counseling/discussion 10/18/2019  . Pancreatic cancer ([place])  10/02/2019  . Chronic migraine without aura without status migrainosus, not intractable 04/23/2019  . Migraine with aura and without status migrainosus, not intractable 04/23/2019  . Essential hypertension 04/29/2016  . Lupus (systemic lupus erythematosus) ([place]) 04/29/2016  . Asthma 04/29/2016  . Seasonal allergies 04/29/2016  . Abnormal stress test 04/09/2016    Palliative Care Assessment & Plan   Patient Profile: Cynthia Frye is a 60 year old female with metastatic pancreatic cancer, lupus, asthma, recent COVID-19 infection who presented to the hospital with continued abdominal pain, nausea, and failure to thrive.  CT demonstrates widespread cancer with heterogenous contrast uptake in the kidneys.  She is currently being treated with antibiotics for severe sepsis due to pyelonephritis.  Palliative consulted for goals of care.  Recommendations/Plan: Cynthia Frye continues to remain subdued and initially avoids participation however does engage in further discussions.  She tells me that her husband has gone to visit [facility].  I reviewed with her again about the serious incurable nature of her malignancy as well as concept of comfort focused care inside a residential hospice facility.  To this end, I discussed again with the patient about her CODE STATUS.  We discussed about differences between full code versus DO NOT RESUSCITATE.  Offered space and opportunity for reflection.  Patient tells me she is not ready to make a decision.Patient then becomes withdrawn and states she will discuss further with her husband.  Continue to provide empathic compassionate care as patient and her husband try to navigate her current situation.  PMT to follow.   Goals of Care and Additional Recommendations:  Limitations on Scope of Treatment: Full Scope Treatment  Code Status: .    Code Status Orders  (From admission, onward)         Start     Ordered   02/17/20 2319  Full code  Continuous         02/17/20 2318  Code Status History    Date Active Date Inactive Code Status Order ID Comments User Context   02/01/2020 0919 02/07/2020 2350 Full Code [id number]  Jonnie Finner, DO ED   01/15/2020 2037 01/18/2020 1841 Full Code [id number]  Elwyn Reach, MD ED   11/24/2019 1908 12/12/2019 1944 Full Code [id number]  Eugenie Filler, MD Inpatient   11/04/2019 2125 11/09/2019 2207 Full Code [id number]  Rise Patience, MD ED   04/09/2016 1319 04/09/2016 1952 Full Code [id number]  End, Harrell Gave, MD Inpatient   Advance Care Planning Activity       Prognosis:  Likely less than 2 weeks in my opinion.     Discharge Planning: To Be Determined-she appears to be getting weaker by the day.  I am worried that she is going to be strong enough to even consider returning home with hospice.  My recommendation would be to pursue residential hospice for end-of-life care.    Care plan was discussed with patient  Thank you for allowing the Palliative Medicine Team to assist in the care of this patient.   Time In: 12 Time Out: 12.25 Total Time 25 Prolonged Time Billed No   Greater than 50%  of this time was spent counseling and coordinating care related to the above assessment and plan.  Loistine Chance, MD  Please contact Palliative Medicine Team phone at [phone] for questions and concerns.

## 2020-02-27 NOTE — Progress Notes (Signed)
   02/27/20 0139  What Happened  Was fall witnessed? No  Was patient injured? No  Patient found on floor  Found by Staff-comment (Nurse Tech)  Stated prior activity to/from bed, chair, or stretcher  Follow Up  MD notified Lovey Newcomer  Time MD notified [phone]  Family notified Yes - comment  Time family notified 0145  Additional tests No  Progress note created (see row info) Yes  Adult Fall Risk Assessment  Risk Factor Category (scoring not indicated) Fall has occurred during this admission (document High fall risk)  Age 60  Fall History: Fall within 6 months prior to admission 0  Elimination; Bowel and/or Urine Incontinence 0  Elimination; Bowel and/or Urine Urgency/Frequency 0  Medications: includes PCA/Opiates, Anti-convulsants, Anti-hypertensives, Diuretics, Hypnotics, Laxatives, Sedatives, and Psychotropics 5  Patient Care Equipment 1  Mobility-Assistance 2  Mobility-Gait 2  Mobility-Sensory Deficit 0  Altered awareness of immediate physical environment 0  Impulsiveness 0  Lack of understanding of one's physical/cognitive limitations 0  Total Score 10  Patient Fall Risk Level Moderate fall risk  Adult Fall Risk Interventions  Required Bundle Interventions *See Row Information* Moderate fall risk - low and moderate requirements implemented  Additional Interventions Use of appropriate toileting equipment (bedpan, BSC, etc.)  Screening for Fall Injury Risk (To be completed on HIGH fall risk patients) - Assessing Need for Low Bed  Risk For Fall Injury- Low Bed Criteria None identified - Continue screening  Will Implement Low Bed and Floor Mats No - Criteria no longer met for low bed  Specialty Low Bed Contraindicated Hemodynamically unstable  Screening for Fall Injury Risk (To be completed on HIGH fall risk patients who do not meet crieteria for Low Bed) - Assessing Need for Floor Mats Only  Risk For Fall Injury- Criteria for Floor Mats None identified - No additional  interventions needed  Will Implement Floor Mats Yes  Pain Assessment  Pain Scale 0-10  Pain Score 0  Neurological  Neuro (WDL) X  Level of Consciousness Alert  Orientation Level Oriented X4  Cognition Appropriate at baseline  Musculoskeletal  Musculoskeletal (WDL) X  Assistive Device BSC  Generalized Weakness Yes  Weight Bearing Restrictions No  Pt found by staff kneeling beside bed stating trying to reach bed side commode, MD notified family notified. Pt bed alarm set and Pt educated about call bell usage. No new orders at this time will continue to monitor.

## 2020-02-27 NOTE — Progress Notes (Signed)
PROGRESS NOTE    Cynthia Frye   ZHY:[id number]  DOB: 01-03-1961  DOA: 02/17/2020     10  PCP: Josetta Huddle, MD  CC: abdominal pain  Hospital Course: Cynthia Frye is a 60 y.o. female, with medical history of pancreatic cancer s/p chemotherapy, lupus, asthma, recent COVID-19 infection, which was treated with remdesivir in the hospital for 3 days during an admission for sepsis of unclear origin who has continued to have abdominal pain, nausea, and failure to thrive at home prompting return to the ED 1/23.  She was tachycardic, tachypneic with CT abd/pelvis demonstrating known widespread cancer and mild heterogenous contrast uptake in kidneys bilaterally.  She was admitted to the hospital for antibiotics, fluids, and further goals of care discussions.   Interval History:  No events overnight.  Appeared slightly confused this morning and kept repeating that she is going home with hospice.  Did answer a couple questions appropriately however at times seemed off track and staring off.  Old records reviewed in assessment of this patient  ROS: Constitutional: positive for fatigue and malaise, Respiratory: negative for cough, Cardiovascular: negative for chest pain and Gastrointestinal: positive for abdominal pain  Assessment & Plan: Goals of care discussion:  - The patient has advanced cancer which has progressed despite treatment (which she has tolerated poorly), now no longer on treatment of any kind. Hospice has been recommended.  - goals of care meeting (1/31) with Dr. Burr Medico and Dr. Domingo Cocking, appreciate their assistance -> due to her progressive liver function, encephalopathy, and poor overall prognosis, recommended inpatient hospice (discussion on 1/31).  Appreciate assistance of Dr. Domingo Cocking and Dr. Burr Medico.  Mr. Bonneau and Lorma are processing discussions, they haven't made a decision yet - appreciate palliative care assistance.  - appreciate palliative care assistance with symptom  management -Patient continues to have ongoing decline.  At this time it appears she may be more appropriate for residential hospice and unable to return home with hospice in place -Husband is planning to [place] before further decisions made  Acute Metabolic Encephalopathy  Hepatic Encephalopathy: mental status seems improved on lactulose recently.  Ammonia improved, asterixis is improved.  Encephalopathy seems improved.  Suspect this is hepatic encephalopathy due to her progressive liver failure.  Meds could also contribute, but she still complains of pain.  Lactulose, continue to monitor   Hypoalbuminemia  Anasarca: will give albumin, continue to monitor.   Mild amount of ascitics on [place]  Severe sepsis due to pyelonephritis: Diagnosed at admission based on tachycardia, tachypnea, abnormalities of kidneys on CT abdomen. - Febrile 1/24. Urine culture growing Enterococcus faecalis >susceptibility to ampicillin (1/25 - 2/1 - currently on day 10 when including vanc - discussed with pharmacy) plan for 10 days - Follow blood cultures, repeat cultures now since febrile.  Recent covid-19 infection: +SARS-CoV-2 testing 1/7. s/p Pfizer July, Aug 2021. Treated with some remdesivir, never hypoxic to require steroids. Has completed 10 days of isolation.  - DC isolation.  Acute left CFV DVT, chronic right-sided pulmonary emboli: Left iliac vein DVT (10/2019), DVT in the left peroneal and gastrocnemius veins (01/07/2020), DVT in the left common femoral vein (02/01/2020) - Lovenox 1mg /kg q12h  Hyponatremia:  - d/c LR in setting of anasarca  Hypokalemia: replace and follow  Nausea, vomiting, intolerance to enteral feeding, GERD, severe protein calorie malnutrition: Prealbumin undetectable at recent hospitalization. - Maximize antiemetics, hold TPN for now.   Metastatic pancreatic cancer, elevated LFTs  Liver Failure: Progressive despite chemotherapy which is also  poorly tolerated. No  longer on chemotherapy, [place] conversations as noted above.  - bilirubin rising, 8.2 today, INR 2.  AST/ALT relatively stable. continue to follow (no intrahepatic or extrahepatic ductal dilatation on 1/23 CT scan) -> she has likely hepatic encephalopathy, progressive liver failure - prognosis is poor - discussed with Dr. Burr Medico - likely 2/2 metastatic pancreatic cancer - Palliative care consulted  Pancytopenia, anemia of malignancy:  - fluctuating Hb, continue to monitor - 7.9 today - transfuse for <7  Thrombocytopenia: resolved  SLE: No flare. Hold home meds for now  HTN:  - Restart metoprolol. Hold other home meds with softer BPs on admit. Restart when rising BP.   DVT prophylaxis: Lovenox  Code Status:   Code Status: Full Code Family Communication: none present  Disposition Plan: Status is: Inpatient  Remains inpatient appropriate because:Ongoing active pain requiring inpatient pain management, Unsafe d/c plan, IV treatments appropriate due to intensity of illness or inability to take PO and Inpatient level of care appropriate due to severity of illness   Dispo: The patient is from: Home              Anticipated d/c is to: likely residential hospice              Anticipated d/c date is: 2 days              Patient currently is not medically stable to d/c.   Difficult to place patient No  Risk of unplanned readmission score: Unplanned Admission- Pilot do not use: 53.72   Objective: Blood pressure (!) 147/82, pulse (!) 120, temperature 98.7 F (37.1 C), temperature source Oral, resp. rate 18, height 5\' 7"  (1.702 m), weight 79.4 kg, SpO2 97 %.  Examination: General appearance: Chronically ill-appearing adult woman lying in bed in no distress Head: Normocephalic, without obvious abnormality, atraumatic Eyes: EOMI Lungs: clear to auscultation bilaterally Heart: regular rate and rhythm and S1, S2 normal Abdomen: Obese, soft, nonspecific tenderness throughout.  Bowel sounds  present Extremities: Trace lower extremity edema Skin: mobility and turgor normal Neurologic: moves all 4 extremities purposefully  Consultants:     Procedures:     Data Reviewed: I have personally reviewed following labs and imaging studies Results for orders placed or performed during the hospital encounter of 02/17/20 (from the past 24 hour(s))  CBC with Differential/Platelet     Status: Abnormal   Collection Time: 02/27/20  3:09 AM  Result Value Ref Range   WBC 11.7 (H) 4.0 - 10.5 K/uL   RBC 2.43 (L) 3.87 - 5.11 MIL/uL   Hemoglobin 7.9 (L) 12.0 - 15.0 g/dL   HCT 25.1 (L) 36.0 - 46.0 %   MCV 103.3 (H) 80.0 - 100.0 fL   MCH 32.5 26.0 - 34.0 pg   MCHC 31.5 30.0 - 36.0 g/dL   RDW 22.9 (H) 11.5 - 15.5 %   Platelets 164 150 - 400 K/uL   nRBC 0.4 (H) 0.0 - 0.2 %   Neutrophils Relative % 80 %   Neutro Abs 9.4 (H) 1.7 - 7.7 K/uL   Lymphocytes Relative 11 %   Lymphs Abs 1.3 0.7 - 4.0 K/uL   Monocytes Relative 7 %   Monocytes Absolute 0.9 0.1 - 1.0 K/uL   Eosinophils Relative 1 %   Eosinophils Absolute 0.1 0.0 - 0.5 K/uL   Basophils Relative 0 %   Basophils Absolute 0.0 0.0 - 0.1 K/uL   Immature Granulocytes 1 %   Abs Immature Granulocytes 0.07 0.00 -  0.07 K/uL   Polychromasia PRESENT    Target Cells PRESENT   Comprehensive metabolic panel     Status: Abnormal   Collection Time: 02/27/20  3:09 AM  Result Value Ref Range   Sodium 137 135 - 145 mmol/L   Potassium 3.2 (L) 3.5 - 5.1 mmol/L   Chloride 103 98 - 111 mmol/L   CO2 24 22 - 32 mmol/L   Glucose, Bld 91 70 - 99 mg/dL   BUN 8 6 - 20 mg/dL   Creatinine, Ser 0.50 0.44 - 1.00 mg/dL   Calcium 8.3 (L) 8.9 - 10.3 mg/dL   Total Protein 7.0 6.5 - 8.1 g/dL   Albumin 1.7 (L) 3.5 - 5.0 g/dL   AST 134 (H) 15 - 41 U/L   ALT 60 (H) 0 - 44 U/L   Alkaline Phosphatase 224 (H) 38 - 126 U/L   Total Bilirubin 7.7 (H) 0.3 - 1.2 mg/dL   GFR, Estimated >60 >60 mL/min   Anion gap 10 5 - 15  Magnesium     Status: None   Collection  Time: 02/27/20  3:09 AM  Result Value Ref Range   Magnesium 1.7 1.7 - 2.4 mg/dL  Phosphorus     Status: Abnormal   Collection Time: 02/27/20  3:09 AM  Result Value Ref Range   Phosphorus 2.4 (L) 2.5 - 4.6 mg/dL  Protime-INR     Status: Abnormal   Collection Time: 02/27/20  3:09 AM  Result Value Ref Range   Prothrombin Time 23.3 (H) 11.4 - 15.2 seconds   INR 2.2 (H) 0.8 - 1.2  Ammonia     Status: None   Collection Time: 02/27/20 10:29 AM  Result Value Ref Range   Ammonia 28 9 - 35 umol/L    Recent Results (from the past 240 hour(s))  Urine culture     Status: Abnormal   Collection Time: 02/17/20  8:20 PM   Specimen: In/Out Cath Urine  Result Value Ref Range Status   Specimen Description   Final    IN/OUT CATH URINE Performed at [facility], [place] [address], [place], [place] [address]    Special Requests   Final    NONE Performed at [facility], [place] [address], [place], [place] [address]    Culture 40,000 COLONIES/mL ENTEROCOCCUS FAECALIS (A)  Final   Report Status 02/20/2020 FINAL  Final   Organism ID, Bacteria ENTEROCOCCUS FAECALIS (A)  Final      Susceptibility   Enterococcus faecalis - MIC*    AMPICILLIN <=2 SENSITIVE Sensitive     NITROFURANTOIN <=16 SENSITIVE Sensitive     VANCOMYCIN 1 SENSITIVE Sensitive     * 40,000 COLONIES/mL ENTEROCOCCUS FAECALIS  MRSA PCR Screening     Status: None   Collection Time: 02/17/20 11:18 PM   Specimen: Nasal Mucosa; Nasopharyngeal  Result Value Ref Range Status   MRSA by PCR NEGATIVE NEGATIVE Final    Comment:        The GeneXpert MRSA Assay (FDA approved for NASAL specimens only), is one component of a comprehensive MRSA colonization surveillance program. It is not intended to diagnose MRSA infection nor to guide or monitor treatment for MRSA infections. Performed at [facility], [place] [address] [place], [place] [address]   Culture, blood (routine x 2)     Status:  None   Collection Time: 02/19/20  1:27 PM   Specimen: BLOOD  Result Value Ref Range Status   Specimen Description  Final    BLOOD RIGHT ANTECUBITAL Performed at [place] [address] [place], [place] [address]    Special Requests   Final    BOTTLES DRAWN AEROBIC ONLY Blood Culture results may not be optimal due to an inadequate volume of blood received in culture bottles Performed at [place] [address], [place], [place] [address]    Culture   Final    NO GROWTH 5 DAYS Performed at [facility], [place] [address], [place], [place] [address]    Report Status 02/24/2020 FINAL  Final  Culture, blood (routine x 2)     Status: None   Collection Time: 02/19/20  1:34 PM   Specimen: BLOOD RIGHT HAND  Result Value Ref Range Status   Specimen Description   Final    BLOOD RIGHT HAND Performed at [place] [address], [place], [place] [address]    Special Requests   Final    BOTTLES DRAWN AEROBIC ONLY Blood Culture results may not be optimal due to an inadequate volume of blood received in culture bottles Performed at [place] [address], [place], [place] [address]    Culture   Final    NO GROWTH 5 DAYS Performed at [facility], [place] [address], [place], [place] [address]    Report Status 02/24/2020 FINAL  Final     Radiology Studies: No results found. [place] ASCITES (ABDOMEN LIMITED)  Final Result    CT ABDOMEN PELVIS W CONTRAST  Final Result    DG Chest Port 1 View  Final Result      Scheduled Meds: . Chlorhexidine Gluconate Cloth  6 each Topical Daily  . dronabinol  2.5 mg Oral BID AC  . enoxaparin  80 mg Subcutaneous Q12H  . feeding supplement  1 Container Oral Q24H  . feeding supplement  237 mL Oral Q24H  . lactulose  20 g Oral TID  . mouth rinse  15 mL Mouth Rinse BID  . metoprolol succinate  50 mg Oral Daily  . sodium chloride flush  10-40 mL  Intracatheter Q12H  . sucralfate  1 g Oral TID WC & HS   PRN Meds: guaiFENesin-dextromethorphan, HYDROmorphone (DILAUDID) injection, lip balm, LORazepam, ondansetron (ZOFRAN) IV, prochlorperazine, sodium chloride flush, topiramate Continuous Infusions:   LOS: 10 days  Time spent: Greater than 50% of the 35 minute visit was spent in counseling/coordination of care for the patient as laid out in the A&P.   Dwyane Dee, MD [facility] 02/27/2020, 3:48 PM

## 2020-02-27 NOTE — TOC Progression Note (Signed)
Transition of Care [facility]) - Progression Note    Patient Details  Name: Cynthia Frye MRN: [id number] Date of Birth: 10/02/1960  Transition of Care [facility]) CM/SW [place], [place] Phone Number: 02/27/2020, 12:11 PM  Clinical Narrative:    Spouse requested information about [facility] and requests to visit the facility. CSW help coordinate a visit to tour [facility] today with staff Delsa Sale.   Expected Discharge Plan: Residential vs. Home Barriers to Discharge: Continued Medical Work up  Expected Discharge Plan and Services Expected Discharge Plan: Home/Self Care   Discharge Planning Services: CM Consult   Living arrangements for the past 2 months: Single Family Home                                       Social Determinants of Health (SDOH) Interventions    Readmission Risk Interventions Readmission Risk Prevention Plan 12/11/2019 11/27/2019  Transportation Screening - Complete  HRI or [place] - Complete  Social Work Consult for [place] Planning/Counseling - Complete  Palliative Care Screening - Not Applicable  Medication Review Press photographer) - Complete  PCP or Specialist appointment within 3-5 days of discharge Complete -  [place] or [place] Complete -  SW Recovery Care/Counseling Consult Complete -  Palliative Care Screening Not Applicable -  [place] Not Applicable -  Some recent data might be hidden

## 2020-02-28 ENCOUNTER — Other Ambulatory Visit: Payer: BC Managed Care – PPO

## 2020-02-28 ENCOUNTER — Ambulatory Visit: Payer: BC Managed Care – PPO | Admitting: Hematology

## 2020-02-28 DIAGNOSIS — R531 Weakness: Secondary | ICD-10-CM

## 2020-02-28 DIAGNOSIS — Z515 Encounter for palliative care: Secondary | ICD-10-CM

## 2020-02-28 LAB — CBC WITH DIFFERENTIAL/PLATELET
Abs Immature Granulocytes: 0.06 10*3/uL (ref 0.00–0.07)
Basophils Absolute: 0.1 10*3/uL (ref 0.0–0.1)
Basophils Relative: 1 %
Eosinophils Absolute: 0.1 10*3/uL (ref 0.0–0.5)
Eosinophils Relative: 1 %
HCT: 25.3 % — ABNORMAL LOW (ref 36.0–46.0)
Hemoglobin: 8 g/dL — ABNORMAL LOW (ref 12.0–15.0)
Immature Granulocytes: 1 %
Lymphocytes Relative: 13 %
Lymphs Abs: 1.5 10*3/uL (ref 0.7–4.0)
MCH: 32.5 pg (ref 26.0–34.0)
MCHC: 31.6 g/dL (ref 30.0–36.0)
MCV: 102.8 fL — ABNORMAL HIGH (ref 80.0–100.0)
Monocytes Absolute: 0.9 10*3/uL (ref 0.1–1.0)
Monocytes Relative: 8 %
Neutro Abs: 9 10*3/uL — ABNORMAL HIGH (ref 1.7–7.7)
Neutrophils Relative %: 76 %
Platelets: 169 10*3/uL (ref 150–400)
RBC: 2.46 MIL/uL — ABNORMAL LOW (ref 3.87–5.11)
RDW: 22.6 % — ABNORMAL HIGH (ref 11.5–15.5)
WBC: 11.6 10*3/uL — ABNORMAL HIGH (ref 4.0–10.5)
nRBC: 0.9 % — ABNORMAL HIGH (ref 0.0–0.2)

## 2020-02-28 LAB — COMPREHENSIVE METABOLIC PANEL
ALT: 58 U/L — ABNORMAL HIGH (ref 0–44)
AST: 144 U/L — ABNORMAL HIGH (ref 15–41)
Albumin: 1.6 g/dL — ABNORMAL LOW (ref 3.5–5.0)
Alkaline Phosphatase: 212 U/L — ABNORMAL HIGH (ref 38–126)
Anion gap: 12 (ref 5–15)
BUN: 9 mg/dL (ref 6–20)
CO2: 23 mmol/L (ref 22–32)
Calcium: 8.3 mg/dL — ABNORMAL LOW (ref 8.9–10.3)
Chloride: 99 mmol/L (ref 98–111)
Creatinine, Ser: 0.43 mg/dL — ABNORMAL LOW (ref 0.44–1.00)
GFR, Estimated: >60 mL/min (ref >60)
Glucose, Bld: 79 mg/dL (ref 70–99)
Potassium: 3.1 mmol/L — ABNORMAL LOW (ref 3.5–5.1)
Sodium: 134 mmol/L — ABNORMAL LOW (ref 135–145)
Total Bilirubin: 7.6 mg/dL — ABNORMAL HIGH (ref 0.3–1.2)
Total Protein: 7.1 g/dL (ref 6.5–8.1)

## 2020-02-28 LAB — PHOSPHORUS: Phosphorus: 2.4 mg/dL — ABNORMAL LOW (ref 2.5–4.6)

## 2020-02-28 LAB — MAGNESIUM: Magnesium: 1.7 mg/dL (ref 1.7–2.4)

## 2020-02-28 MED ORDER — MAGNESIUM SULFATE 2 GM/50ML IV SOLN
2.0000 g | Freq: Once | INTRAVENOUS | Status: AC
  Administered 2020-02-28: 2 g via INTRAVENOUS
  Filled 2020-02-28: qty 50

## 2020-02-28 MED ORDER — METOPROLOL SUCCINATE ER 50 MG PO TB24
75.0000 mg | ORAL_TABLET | Freq: Every day | ORAL | Status: DC
  Administered 2020-02-28 – 2020-03-02 (×4): 75 mg via ORAL
  Filled 2020-02-28 (×4): qty 1

## 2020-02-28 MED ORDER — POTASSIUM PHOSPHATES 15 MMOLE/5ML IV SOLN
30.0000 mmol | Freq: Once | INTRAVENOUS | Status: AC
  Administered 2020-02-28: 30 mmol via INTRAVENOUS
  Filled 2020-02-28: qty 10

## 2020-02-28 NOTE — TOC Progression Note (Signed)
Transition of Care [facility]) - Progression Note    Patient Details  Name: WILBUR LABUDA MRN: [id number] Date of Birth: 06-29-1960  Transition of Care [facility]) CM/SW [place], LCSW Phone Number: 02/28/2020, 1:33 PM  Clinical Narrative:    CSW reached out to the spouse to follow up from [facility] visit yesterday. CSW left a voicemail.   Expected Discharge Plan: Home/Self Care Barriers to Discharge: Continued Medical Work up  Expected Discharge Plan and Services Expected Discharge Plan: Home/Self Care   Discharge Planning Services: CM Consult   Living arrangements for the past 2 months: Single Family Home                                       Social Determinants of Health (SDOH) Interventions    Readmission Risk Interventions Readmission Risk Prevention Plan 12/11/2019 11/27/2019  Transportation Screening - Complete  HRI or [place] - Complete  Social Work Consult for [place] Planning/Counseling - Complete  Palliative Care Screening - Not Applicable  Medication Review Press photographer) - Complete  PCP or Specialist appointment within 3-5 days of discharge Complete -  [place] or Home Care Consult Complete -  SW Recovery Care/Counseling Consult Complete -  Palliative Care Screening Not Applicable -  [place] Not Applicable -  Some recent data might be hidden

## 2020-02-28 NOTE — Plan of Care (Signed)
  Problem: Safety: Goal: Ability to remain free from injury will improve Outcome: Progressing   Problem: Pain Managment: Goal: General experience of comfort will improve Outcome: Progressing   Problem: Coping: Goal: Level of anxiety will decrease Outcome: Progressing   

## 2020-02-28 NOTE — Progress Notes (Signed)
PROGRESS NOTE    YANIAH THIEMANN   IEP:[id number]  DOB: 11-02-1960  DOA: 02/17/2020     11  PCP: Josetta Huddle, MD  CC: abdominal pain  Hospital Course: Jalesia Loudenslager is a 60 y.o. female, with medical history of pancreatic cancer s/p chemotherapy, lupus, asthma, recent COVID-19 infection, which was treated with remdesivir in the hospital for 3 days during an admission for sepsis of unclear origin who has continued to have abdominal pain, nausea, and failure to thrive at home prompting return to the ED 1/23.  She was tachycardic, tachypneic with CT abd/pelvis demonstrating known widespread cancer and mild heterogenous contrast uptake in kidneys bilaterally.  She was admitted to the hospital for antibiotics, fluids, and further goals of care discussions.   Interval History:  No events overnight.  Still confused in bed this morning.  Was holding the phone to her ear with no one on the phone.  Otherwise appears about the same as previous.  Old records reviewed in assessment of this patient  ROS: Constitutional: positive for fatigue and malaise, Respiratory: negative for cough, Cardiovascular: negative for chest pain and Gastrointestinal: positive for abdominal pain  Assessment & Plan: Goals of care discussion:  - The patient has advanced cancer which has progressed despite treatment (which she has tolerated poorly), now no longer on treatment of any kind. Hospice has been recommended.  - goals of care meeting (1/31) with Dr. Burr Medico and Dr. Domingo Cocking, appreciate their assistance -> due to her progressive liver function, encephalopathy, and poor overall prognosis, recommended inpatient hospice (discussion on 1/31).  Appreciate assistance of Dr. Domingo Cocking and Dr. Burr Medico.  Mr. Sand and Magdalena are processing discussions, they haven't made a decision yet - appreciate palliative care assistance.  - appreciate palliative care assistance with symptom management -Patient continues to have ongoing decline.   At this time it appears she may be more appropriate for residential hospice and unable to return home with hospice in place -Husband is planning to [place] before further decisions made  Acute Metabolic Encephalopathy  Hepatic Encephalopathy: mental status seems improved on lactulose recently.  Ammonia improved, asterixis is improved.  Encephalopathy seems improved.  Suspect this is hepatic encephalopathy due to her progressive liver failure.  Meds could also contribute, but she still complains of pain.  Lactulose, continue to monitor   Hypoalbuminemia  Anasarca: will give albumin, continue to monitor.   Mild amount of ascitics on [place]  Severe sepsis due to pyelonephritis: Diagnosed at admission based on tachycardia, tachypnea, abnormalities of kidneys on CT abdomen. - Febrile 1/24. Urine culture growing Enterococcus faecalis >susceptibility to ampicillin (1/25 - 2/1 - currently on day 10 when including vanc - discussed with pharmacy) plan for 10 days - Follow blood cultures, repeat cultures now since febrile.  Recent covid-19 infection: +SARS-CoV-2 testing 1/7. s/p Pfizer July, Aug 2021. Treated with some remdesivir, never hypoxic to require steroids. Has completed 10 days of isolation.  - DC isolation.  Acute left CFV DVT, chronic right-sided pulmonary emboli: Left iliac vein DVT (10/2019), DVT in the left peroneal and gastrocnemius veins (01/07/2020), DVT in the left common femoral vein (02/01/2020) - Lovenox 1mg /kg q12h  Hyponatremia:  - d/c LR in setting of anasarca  Hypokalemia: replace and follow  Nausea, vomiting, intolerance to enteral feeding, GERD, severe protein calorie malnutrition: Prealbumin undetectable at recent hospitalization. - Maximize antiemetics, hold TPN for now.   Metastatic pancreatic cancer, elevated LFTs  Liver Failure: Progressive despite chemotherapy which is also poorly tolerated. No  longer on chemotherapy, [place] conversations as noted  above.  - bilirubin rising, 8.2 today, INR 2.  AST/ALT relatively stable. continue to follow (no intrahepatic or extrahepatic ductal dilatation on 1/23 CT scan) -> she has likely hepatic encephalopathy, progressive liver failure - prognosis is poor - discussed with Dr. Burr Medico - likely 2/2 metastatic pancreatic cancer - Palliative care consulted  Pancytopenia, anemia of malignancy:  - fluctuating Hb, continue to monitor - 7.9 today - transfuse for <7  Thrombocytopenia: resolved  SLE: No flare. Hold home meds for now  HTN:  - Restart metoprolol. Hold other home meds with softer BPs on admit. Restart when rising BP.   DVT prophylaxis: Lovenox  Code Status:   Code Status: Full Code Family Communication: none present  Disposition Plan: Status is: Inpatient  Remains inpatient appropriate because:Ongoing active pain requiring inpatient pain management, Unsafe d/c plan, IV treatments appropriate due to intensity of illness or inability to take PO and Inpatient level of care appropriate due to severity of illness   Dispo: The patient is from: Home              Anticipated d/c is to: likely residential hospice              Anticipated d/c date is: 2 days              Patient currently is not medically stable to d/c.   Difficult to place patient No  Risk of unplanned readmission score: Unplanned Admission- Pilot do not use: 59.15   Objective: Blood pressure 127/85, pulse (!) 126, temperature 98.3 F (36.8 C), temperature source Oral, resp. rate 16, height 5\' 7"  (1.702 m), weight 79.4 kg, SpO2 98 %.  Examination: General appearance: Chronically ill-appearing adult woman lying in bed in no distress Head: Normocephalic, without obvious abnormality, atraumatic Eyes: EOMI Lungs: clear to auscultation bilaterally Heart: regular rate and rhythm and S1, S2 normal Abdomen: Obese, soft, nonspecific tenderness throughout.  Bowel sounds present Extremities: Trace lower extremity edema Skin:  mobility and turgor normal Neurologic: moves all 4 extremities purposefully  Consultants:     Procedures:     Data Reviewed: I have personally reviewed following labs and imaging studies Results for orders placed or performed during the hospital encounter of 02/17/20 (from the past 24 hour(s))  CBC with Differential/Platelet     Status: Abnormal   Collection Time: 02/28/20  3:36 AM  Result Value Ref Range   WBC 11.6 (H) 4.0 - 10.5 K/uL   RBC 2.46 (L) 3.87 - 5.11 MIL/uL   Hemoglobin 8.0 (L) 12.0 - 15.0 g/dL   HCT 25.3 (L) 36.0 - 46.0 %   MCV 102.8 (H) 80.0 - 100.0 fL   MCH 32.5 26.0 - 34.0 pg   MCHC 31.6 30.0 - 36.0 g/dL   RDW 22.6 (H) 11.5 - 15.5 %   Platelets 169 150 - 400 K/uL   nRBC 0.9 (H) 0.0 - 0.2 %   Neutrophils Relative % 76 %   Neutro Abs 9.0 (H) 1.7 - 7.7 K/uL   Lymphocytes Relative 13 %   Lymphs Abs 1.5 0.7 - 4.0 K/uL   Monocytes Relative 8 %   Monocytes Absolute 0.9 0.1 - 1.0 K/uL   Eosinophils Relative 1 %   Eosinophils Absolute 0.1 0.0 - 0.5 K/uL   Basophils Relative 1 %   Basophils Absolute 0.1 0.0 - 0.1 K/uL   Immature Granulocytes 1 %   Abs Immature Granulocytes 0.06 0.00 - 0.07 K/uL  Comprehensive  metabolic panel     Status: Abnormal   Collection Time: 02/28/20  3:36 AM  Result Value Ref Range   Sodium 134 (L) 135 - 145 mmol/L   Potassium 3.1 (L) 3.5 - 5.1 mmol/L   Chloride 99 98 - 111 mmol/L   CO2 23 22 - 32 mmol/L   Glucose, Bld 79 70 - 99 mg/dL   BUN 9 6 - 20 mg/dL   Creatinine, Ser 0.43 (L) 0.44 - 1.00 mg/dL   Calcium 8.3 (L) 8.9 - 10.3 mg/dL   Total Protein 7.1 6.5 - 8.1 g/dL   Albumin 1.6 (L) 3.5 - 5.0 g/dL   AST 144 (H) 15 - 41 U/L   ALT 58 (H) 0 - 44 U/L   Alkaline Phosphatase 212 (H) 38 - 126 U/L   Total Bilirubin 7.6 (H) 0.3 - 1.2 mg/dL   GFR, Estimated >60 >60 mL/min   Anion gap 12 5 - 15  Magnesium     Status: None   Collection Time: 02/28/20  3:36 AM  Result Value Ref Range   Magnesium 1.7 1.7 - 2.4 mg/dL  Phosphorus      Status: Abnormal   Collection Time: 02/28/20  3:36 AM  Result Value Ref Range   Phosphorus 2.4 (L) 2.5 - 4.6 mg/dL    Recent Results (from the past 240 hour(s))  Culture, blood (routine x 2)     Status: None   Collection Time: 02/19/20  1:27 PM   Specimen: BLOOD  Result Value Ref Range Status   Specimen Description   Final    BLOOD RIGHT ANTECUBITAL Performed at [facility], [place] [address], [place], [place] [address]    Special Requests   Final    BOTTLES DRAWN AEROBIC ONLY Blood Culture results may not be optimal due to an inadequate volume of blood received in culture bottles Performed at [place] [address] [place], [place] [address]    Culture   Final    NO GROWTH 5 DAYS Performed at [facility], [place] [address] [place], [place] [address]    Report Status 02/24/2020 FINAL  Final  Culture, blood (routine x 2)     Status: None   Collection Time: 02/19/20  1:34 PM   Specimen: BLOOD RIGHT HAND  Result Value Ref Range Status   Specimen Description   Final    BLOOD RIGHT HAND Performed at [place] [address] [place], [place] [address]    Special Requests   Final    BOTTLES DRAWN AEROBIC ONLY Blood Culture results may not be optimal due to an inadequate volume of blood received in culture bottles Performed at [place] [address] [place], [place] [address]    Culture   Final    NO GROWTH 5 DAYS Performed at [facility], [place] [address], [place], [place] [address]    Report Status 02/24/2020 FINAL  Final     Radiology Studies: No results found. [place] ASCITES (ABDOMEN LIMITED)  Final Result    CT ABDOMEN PELVIS W CONTRAST  Final Result    DG Chest Port 1 View  Final Result      Scheduled Meds: . Chlorhexidine Gluconate Cloth  6 each Topical Daily  . dronabinol  2.5 mg Oral BID AC  . enoxaparin  80 mg Subcutaneous Q12H  . feeding supplement  1  Container Oral Q24H  . feeding supplement  237 mL Oral Q24H  .  lactulose  20 g Oral TID  . mouth rinse  15 mL Mouth Rinse BID  . metoprolol succinate  75 mg Oral Daily  . sodium chloride flush  10-40 mL Intracatheter Q12H  . sucralfate  1 g Oral TID WC & HS   PRN Meds: guaiFENesin-dextromethorphan, HYDROmorphone (DILAUDID) injection, lip balm, LORazepam, ondansetron (ZOFRAN) IV, prochlorperazine, sodium chloride flush, topiramate Continuous Infusions: . potassium PHOSPHATE IVPB (in mmol) 30 mmol (02/28/20 1107)     LOS: 11 days  Time spent: Greater than 50% of the 35 minute visit was spent in counseling/coordination of care for the patient as laid out in the A&P.   Dwyane Dee, MD [facility] 02/28/2020, 2:05 PM

## 2020-02-28 NOTE — TOC Progression Note (Addendum)
Transition of Care [facility]) - Progression Note    Patient Details  Name: Cynthia Frye MRN: [id number] Date of Birth: 10-Apr-1960  Transition of Care [facility]) CM/SW [place], [place] Phone Number: 02/28/2020, 4:23 PM  Clinical Narrative:    CSW met with spouse and patient at bedside. Spouse said he was pleased with the facility and the ratings. Patient asked, "what is [place] [address]?" CSW explained it was a nursing home and rehab facility. CSW explain [facility] process for a bed at [facility]. Spouse asked the patient if she wanted to be put on the list today for a bed at [facility]. She did not respond. Spouse reports they will discuss her decision tonight and have a decision in the morning. CSW will follow up in the am.    Expected Discharge Plan: Home/Self Care Barriers to Discharge: Continued Medical Work up  Expected Discharge Plan and Services Expected Discharge Plan: Home/Self Care   Discharge Planning Services: CM Consult   Living arrangements for the past 2 months: Single Family Home                                       Social Determinants of Health (SDOH) Interventions    Readmission Risk Interventions Readmission Risk Prevention Plan 12/11/2019 11/27/2019  Transportation Screening - Complete  HRI or [place] - Complete  Social Work Consult for [place] Planning/Counseling - Complete  Palliative Care Screening - Not Applicable  Medication Review Press photographer) - Complete  PCP or Specialist appointment within 3-5 days of discharge Complete -  [place] or [place] Complete -  SW Recovery Care/Counseling Consult Complete -  Palliative Care Screening Not Applicable -  [place] Not Applicable -  Some recent data might be hidden

## 2020-02-28 NOTE — Progress Notes (Signed)
Daily Progress Note   Patient Name: Cynthia Frye       Date: 02/28/2020 DOB: [birth year]  Age: 60 y.o. MRN#: [id number] Attending Physician: Dwyane Dee, MD Primary Care Physician: Josetta Huddle, MD Admit Date: 02/17/2020  Reason for Consultation/Follow-up: Establishing goals of care  Subjective: Cynthia Frye is resting in bed, not in time, possibly confused.  She has upper extremity edema.  She appears weaker day by day.  She does tell me that her husband did take a tourr of [facility].  She does not state further about what he thought of the residential hospice facility.  She does not engage or interact much today.  Length of Stay: 11  Current Medications: Scheduled Meds:  . Chlorhexidine Gluconate Cloth  6 each Topical Daily  . dronabinol  2.5 mg Oral BID AC  . enoxaparin  80 mg Subcutaneous Q12H  . feeding supplement  1 Container Oral Q24H  . feeding supplement  237 mL Oral Q24H  . lactulose  20 g Oral TID  . mouth rinse  15 mL Mouth Rinse BID  . metoprolol succinate  75 mg Oral Daily  . sodium chloride flush  10-40 mL Intracatheter Q12H  . sucralfate  1 g Oral TID WC & HS    Continuous Infusions: . potassium PHOSPHATE IVPB (in mmol) 30 mmol (02/28/20 1107)    PRN Meds: guaiFENesin-dextromethorphan, HYDROmorphone (DILAUDID) injection, lip balm, LORazepam, ondansetron (ZOFRAN) IV, prochlorperazine, sodium chloride flush, topiramate  Physical Exam        General: appears subdued and weak, probably confused  Chronically ill appearing.   HEENT: No bruits, no goiter, no JVD Heart: Regular rate and rhythm. No murmur appreciated. Lungs: Good air movement, clear Abdomen: Soft, nontender, nondistended, positive bowel sounds.  Ext: has edema Skin: Warm and dry  Vital Signs:  BP 127/85 (BP Location: Right Arm)   Pulse (!) 126   Temp 98.3 F (36.8 C) (Oral)   Resp 16   Ht 5\' 7"  (1.702 m)   Wt 79.4 kg   SpO2 98%   BMI 27.42 kg/m  SpO2: SpO2: 98 % O2 Device: O2 Device: Room Air O2 Flow Rate:    Intake/output summary:   Intake/Output Summary (Last 24 hours) at 02/28/2020 1415 Last data filed at 02/28/2020 0200 Gross per 24 hour  Intake 60  ml  Output 2 ml  Net 58 ml   LBM: Last BM Date: 02/28/20 Baseline Weight: Weight: 79.4 kg Most recent weight: Weight: 79.4 kg       Palliative Assessment/Data:    Flowsheet Rows   Flowsheet Row Most Recent Value  Intake Tab   Referral Department Hospitalist  Unit at Time of Referral Med/Surg Unit  Palliative Care Primary Diagnosis Cancer  Date Notified 02/18/20  Palliative Care Type Return patient Palliative Care  Reason for referral Non-pain Symptom, Clarify Goals of Care  Date of Admission 02/17/20  Date first seen by Palliative Care 02/19/20  # of days Palliative referral response time 1 Day(s)  # of days IP prior to Palliative referral 1  Clinical Assessment   Palliative Performance Scale Score 30%  Psychosocial & Spiritual Assessment   Palliative Care Outcomes   Patient/Family meeting held? Yes  Who was at the meeting? patient, husband      Patient Active Problem List   Diagnosis Date Noted  . Pancreatic cancer metastasized to liver ([place])   . Pyelonephritis   . Severe sepsis ([place]) 02/17/2020  . COVID-19 virus infection 02/01/2020  . Sepsis ([place]) 02/01/2020  . Biliary obstruction 01/15/2020  . Abdominal pain 01/04/2020  . Dysphagia 12/10/2019  . Malnutrition of moderate degree 11/26/2019  . 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 Constipation 11/25/2019  . Generalized abdominal pain   . Nausea and vomiting in adult 11/24/2019  . Dehydration 11/24/2019  . Prolonged QT interval 11/24/2019  . Emesis   . Tachycardia   . Genetic testing 11/12/2019  . Colitis 11/04/2019  . DVT  (deep venous thrombosis) ([place]) 11/04/2019  . Hypokalemia 11/04/2019  . Port-A-Cath in place 10/24/2019  . Goals of care, counseling/discussion 10/18/2019  . Pancreatic cancer ([place]) 10/02/2019  . Chronic migraine without aura without status migrainosus, not intractable 04/23/2019  . Migraine with aura and without status migrainosus, not intractable 04/23/2019  . Essential hypertension 04/29/2016  . Lupus (systemic lupus erythematosus) ([place]) 04/29/2016  . Asthma 04/29/2016  . Seasonal allergies 04/29/2016  . Abnormal stress test 04/09/2016    Palliative Care Assessment & Plan   Patient Profile: Cynthia Frye is a 60 year old female with metastatic pancreatic cancer, lupus, asthma, recent COVID-19 infection who presented to the hospital with continued abdominal pain, nausea, and failure to thrive.  CT demonstrates widespread cancer with heterogenous contrast uptake in the kidneys.  She is currently being treated with antibiotics for severe sepsis due to pyelonephritis.  Palliative consulted for goals of care.  Recommendations/Plan: Continue goals of care discussions continue CODE STATUS discussions recommendations from the palliative medicine team are for DNR/DNI and residential hospice with full and exclusive focus on symptom management.  It is possible that the patient's prognosis is probably 2 weeks or less at this juncture.   Goals of Care and Additional Recommendations:  Limitations on Scope of Treatment: Full Scope Treatment  Code Status: .    Code Status Orders  (From admission, onward)         Start     Ordered   02/17/20 2319  Full code  Continuous        02/17/20 2318        Code Status History    Date Active Date Inactive Code Status Order ID Comments User Context   02/01/2020 0919 02/07/2020 2350 Full Code [id number]  Jonnie Finner, DO ED   01/15/2020 2037 01/18/2020 1841 Full Code [id number]  Elwyn Reach, MD ED  11/24/2019 1908 12/12/2019 1944 Full Code [id number]   Eugenie Filler, MD Inpatient   11/04/2019 2125 11/09/2019 2207 Full Code [id number]  Rise Patience, MD ED   04/09/2016 1319 04/09/2016 1952 Full Code [id number]  End, Harrell Gave, MD Inpatient   Advance Care Planning Activity       Prognosis:  Likely less than 2 weeks in my opinion.     Discharge Planning: To Be Determined-she appears to be getting weaker by the day.  I am worried that she is going to be strong enough to even consider returning home with hospice.  My recommendation would be to pursue residential hospice for end-of-life care.    Care plan was discussed with patient as well as TRH MD Thank you for allowing the Palliative Medicine Team to assist in the care of this patient.   Time In: 12 Time Out: 12.25 Total Time 25 Prolonged Time Billed No   Greater than 50%  of this time was spent counseling and coordinating care related to the above assessment and plan.  Loistine Chance, MD  Please contact Palliative Medicine Team phone at [phone] for questions and concerns.

## 2020-02-29 DIAGNOSIS — I82412 Acute embolism and thrombosis of left femoral vein: Secondary | ICD-10-CM

## 2020-02-29 LAB — CBC WITH DIFFERENTIAL/PLATELET
Abs Immature Granulocytes: 0.07 10*3/uL (ref 0.00–0.07)
Basophils Absolute: 0.1 10*3/uL (ref 0.0–0.1)
Basophils Relative: 1 %
Eosinophils Absolute: 0.1 10*3/uL (ref 0.0–0.5)
Eosinophils Relative: 1 %
HCT: 25.2 % — ABNORMAL LOW (ref 36.0–46.0)
Hemoglobin: 8 g/dL — ABNORMAL LOW (ref 12.0–15.0)
Immature Granulocytes: 1 %
Lymphocytes Relative: 15 %
Lymphs Abs: 1.7 10*3/uL (ref 0.7–4.0)
MCH: 33.1 pg (ref 26.0–34.0)
MCHC: 31.7 g/dL (ref 30.0–36.0)
MCV: 104.1 fL — ABNORMAL HIGH (ref 80.0–100.0)
Monocytes Absolute: 0.9 10*3/uL (ref 0.1–1.0)
Monocytes Relative: 8 %
Neutro Abs: 8.3 10*3/uL — ABNORMAL HIGH (ref 1.7–7.7)
Neutrophils Relative %: 74 %
Platelets: 157 10*3/uL (ref 150–400)
RBC: 2.42 MIL/uL — ABNORMAL LOW (ref 3.87–5.11)
RDW: 23 % — ABNORMAL HIGH (ref 11.5–15.5)
WBC: 11.1 10*3/uL — ABNORMAL HIGH (ref 4.0–10.5)
nRBC: 1 % — ABNORMAL HIGH (ref 0.0–0.2)

## 2020-02-29 LAB — COMPREHENSIVE METABOLIC PANEL
ALT: 57 U/L — ABNORMAL HIGH (ref 0–44)
AST: 148 U/L — ABNORMAL HIGH (ref 15–41)
Albumin: 1.6 g/dL — ABNORMAL LOW (ref 3.5–5.0)
Alkaline Phosphatase: 188 U/L — ABNORMAL HIGH (ref 38–126)
Anion gap: 7 (ref 5–15)
BUN: 9 mg/dL (ref 6–20)
CO2: 28 mmol/L (ref 22–32)
Calcium: 8.5 mg/dL — ABNORMAL LOW (ref 8.9–10.3)
Chloride: 102 mmol/L (ref 98–111)
Creatinine, Ser: 0.48 mg/dL (ref 0.44–1.00)
GFR, Estimated: >60 mL/min (ref >60)
Glucose, Bld: 86 mg/dL (ref 70–99)
Potassium: 3.2 mmol/L — ABNORMAL LOW (ref 3.5–5.1)
Sodium: 137 mmol/L (ref 135–145)
Total Bilirubin: 7.3 mg/dL — ABNORMAL HIGH (ref 0.3–1.2)
Total Protein: 7.2 g/dL (ref 6.5–8.1)

## 2020-02-29 LAB — MAGNESIUM: Magnesium: 2 mg/dL (ref 1.7–2.4)

## 2020-02-29 LAB — PHOSPHORUS: Phosphorus: 2.5 mg/dL (ref 2.5–4.6)

## 2020-02-29 MED ORDER — SODIUM CHLORIDE 0.9 % IV SOLN
INTRAVENOUS | Status: DC | PRN
  Administered 2020-02-29: 500 mL via INTRAVENOUS

## 2020-02-29 NOTE — Progress Notes (Signed)
Daily Progress Note   Patient Name: Cynthia Frye       Date: 02/29/2020 DOB: 21-Dec-1960  Age: 60 y.o. MRN#: [id number] Attending Physician: Dwyane Dee, MD Primary Care Physician: Josetta Huddle, MD Admit Date: 02/17/2020  Reason for Consultation/Follow-up: Establishing goals of care  Subjective: Ms Crain is resting in bed, she now has worsening edema and she is more lethargic, how ever, she opens her eyes and does interact some with me. I discussed again with her about [facility] as well as recommendation for DNR, I then placed a call and got in touch with Mr Longie at his home number as I was unsuccessful in reaching his cell phone. He did take a tour of [facility] residential hospice on 2-3, see below.     Length of Stay: 12  Current Medications: Scheduled Meds:  . Chlorhexidine Gluconate Cloth  6 each Topical Daily  . dronabinol  2.5 mg Oral BID AC  . enoxaparin  80 mg Subcutaneous Q12H  . feeding supplement  1 Container Oral Q24H  . feeding supplement  237 mL Oral Q24H  . lactulose  20 g Oral TID  . mouth rinse  15 mL Mouth Rinse BID  . metoprolol succinate  75 mg Oral Daily  . sodium chloride flush  10-40 mL Intracatheter Q12H  . sucralfate  1 g Oral TID WC & HS    Continuous Infusions: . sodium chloride 500 mL (02/29/20 1050)    PRN Meds: sodium chloride, guaiFENesin-dextromethorphan, HYDROmorphone (DILAUDID) injection, lip balm, LORazepam, ondansetron (ZOFRAN) IV, prochlorperazine, sodium chloride flush, topiramate  Physical Exam        General: appears lethargic and confused  Chronically ill appearing.   HEENT: No bruits, no goiter, no JVD Heart: Regular rate and rhythm. No murmur appreciated. Lungs: Good air movement, clear Abdomen: Soft, nontender,  nondistended, positive bowel sounds.  Ext: has worsening edema Skin: Warm and dry  Vital Signs: BP 122/88 (BP Location: Right Arm)   Pulse (!) 118   Temp 98.2 F (36.8 C) (Oral)   Resp 18   Ht 5\' 7"  (1.702 m)   Wt 79.4 kg   SpO2 99%   BMI 27.42 kg/m  SpO2: SpO2: 99 % O2 Device: O2 Device: Room Air O2 Flow Rate:    Intake/output summary:   Intake/Output Summary (  Last 24 hours) at 02/29/2020 1222 Last data filed at 02/28/2020 1500 Gross per 24 hour  Intake 380.68 ml  Output --  Net 380.68 ml   LBM: Last BM Date: 02/28/20 Baseline Weight: Weight: 79.4 kg Most recent weight: Weight: 79.4 kg       Palliative Assessment/Data:    Flowsheet Rows   Flowsheet Row Most Recent Value  Intake Tab   Referral Department Hospitalist  Unit at Time of Referral Med/Surg Unit  Palliative Care Primary Diagnosis Cancer  Date Notified 02/18/20  Palliative Care Type Return patient Palliative Care  Reason for referral Non-pain Symptom, Clarify Goals of Care  Date of Admission 02/17/20  Date first seen by Palliative Care 02/19/20  # of days Palliative referral response time 1 Day(s)  # of days IP prior to Palliative referral 1  Clinical Assessment   Palliative Performance Scale Score 30%  Psychosocial & Spiritual Assessment   Palliative Care Outcomes   Patient/Family meeting held? Yes  Who was at the meeting? patient, husband      Patient Active Problem List   Diagnosis Date Noted  . Palliative care by specialist   . General weakness   . Pancreatic cancer metastasized to liver ([place])   . Pyelonephritis   . Severe sepsis ([place]) 02/17/2020  . COVID-19 virus infection 02/01/2020  . Sepsis ([place]) 02/01/2020  . Biliary obstruction 01/15/2020  . Abdominal pain 01/04/2020  . Dysphagia 12/10/2019  . Malnutrition of moderate degree 11/26/2019  . 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 Constipation 11/25/2019  . Generalized abdominal pain   . Nausea and vomiting in adult  11/24/2019  . Dehydration 11/24/2019  . Prolonged QT interval 11/24/2019  . Emesis   . Tachycardia   . Genetic testing 11/12/2019  . Colitis 11/04/2019  . DVT (deep venous thrombosis) ([place]) 11/04/2019  . Hypokalemia 11/04/2019  . Port-A-Cath in place 10/24/2019  . Goals of care, counseling/discussion 10/18/2019  . Pancreatic cancer ([place]) 10/02/2019  . Chronic migraine without aura without status migrainosus, not intractable 04/23/2019  . Migraine with aura and without status migrainosus, not intractable 04/23/2019  . Essential hypertension 04/29/2016  . Lupus (systemic lupus erythematosus) ([place]) 04/29/2016  . Asthma 04/29/2016  . Seasonal allergies 04/29/2016  . Abnormal stress test 04/09/2016    Palliative Care Assessment & Plan   Patient Profile: Ms. Tanouye is a 60 year old female with metastatic pancreatic cancer, lupus, asthma, recent COVID-19 infection who presented to the hospital with continued abdominal pain, nausea, and failure to thrive.  CT demonstrates widespread cancer with heterogenous contrast uptake in the kidneys.  She is currently being treated with antibiotics for severe sepsis due to pyelonephritis.  Palliative consulted for goals of care.  Recommendations/Plan: I discussed with the patient in detail about code status and hospice philosophy of care.   Call placed, able to reach husband Mr Starkman on home phone. We talked about patient's current condition, described to him about possibility of limited prognosis of days to up to 2 weeks at this time.   End of life signs and symptoms discussed, offered active listening and supportive presence, as much as is possible over the telephone.   I discussed with both patient and husband that the full scope of a resuscitative attempt would only bring about more harm and suffering, at this stage, with the patient's advanced incurable malignancy. Hence, we talked about establishing DNR DNI. I reviewed that it does not mean do  not treat.   We discussed that the role of  an inpatient hospice environment is to control symptoms and to provide support to patient and family and this becomes especially more important when a markedly limited prognosis is expected, as in her case.   All of his questions addressed to the best of my ability.      Goals of Care and Additional Recommendations:  Limitations on Scope of Treatment:   Code status now established as DNR  Code Status: DNR DNI as of 02-29-20.   .    Code Status Orders  (From admission, onward)         Start     Ordered   02/17/20 2319  Full code  Continuous        02/17/20 2318        Code Status History    Date Active Date Inactive Code Status Order ID Comments User Context   02/01/2020 0919 02/07/2020 2350 Full Code [id number]  Jonnie Finner, DO ED   01/15/2020 2037 01/18/2020 1841 Full Code [id number]  Elwyn Reach, MD ED   11/24/2019 1908 12/12/2019 1944 Full Code [id number]  Eugenie Filler, MD Inpatient   11/04/2019 2125 11/09/2019 2207 Full Code [id number]  Rise Patience, MD ED   04/09/2016 1319 04/09/2016 1952 Full Code [id number]  End, Harrell Gave, MD Inpatient   Advance Care Planning Activity       Prognosis:  Likely less than 2 weeks in my opinion.     Discharge Planning: -  My recommendation would be to pursue residential hospice for aggressive symptom management at end-of-life care.    Care plan was discussed with patient, husband on the phone, TOC and hospice liaison.   Thank you for allowing the Palliative Medicine Team to assist in the care of this patient.   Time In: 11.30 Time Out: 12.05 Total Time 35 Prolonged Time Billed No   Greater than 50%  of this time was spent counseling and coordinating care related to the above assessment and plan.  Loistine Chance, MD  Please contact Palliative Medicine Team phone at [phone] for questions and concerns.

## 2020-02-29 NOTE — TOC Progression Note (Addendum)
Transition of Care [facility]) - Progression Note    Patient Details  Name: Cynthia Frye MRN: [id number] Date of Birth: 1960-04-16  Transition of Care [facility]) CM/SW [place], [place] Phone Number: 02/29/2020, 12:23 PM  Clinical Narrative:    CSW met with the patient at bedside. CSW asked about going to residential hospice. Patient said "yes" to transitioning to [facility]. CSW reached out to to her spouse and notified him of her decision. He is agreeable.  CSW made a referral to [facility].   1:00PM CSW met with spouse and patient at bedside.CSW informed them there is no bed today. CSW answered spouse questions about consents and transportation. CSW explain [facility] will contact spouse to complete consents. [facility] will provide transport.  Spouse reports this weekend  he can be reached at [phone].  Monday- Friday he can be reached at [phone] ext. [phone]  Expected Discharge Plan: [place] Barriers to Discharge: Barriers Resolved  Expected Discharge Plan and Services Expected Discharge Plan: [place]   Discharge Planning Services: CM Consult   Living arrangements for the past 2 months: Single Family Home                                       Social Determinants of Health (SDOH) Interventions    Readmission Risk Interventions Readmission Risk Prevention Plan 12/11/2019 11/27/2019  Transportation Screening - Complete  HRI or [place] - Complete  Social Work Consult for [place] Planning/Counseling - Complete  Palliative Care Screening - Not Applicable  Medication Review Press photographer) - Complete  PCP or Specialist appointment within 3-5 days of discharge Complete -  [place] or Home Care Consult Complete -  SW Recovery Care/Counseling Consult Complete -  Palliative Care Screening Not Applicable -  [place] Not Applicable -  Some recent data might be hidden

## 2020-02-29 NOTE — Progress Notes (Signed)
Manufacturing engineer [facility])  Referral received for residential hospice at [facility]  Ms. Ibbotson confirmed that she is interested in going to [facility].  Spouse toured the facility on 2/2 and is in agreement as well.  There is not a bed to offer today.  ACC will update family and TOC once bed status changes.   Venia Carbon RN, BSN, [facility] Liaison

## 2020-02-29 NOTE — Progress Notes (Signed)
PROGRESS NOTE    Cynthia Frye   IRS:[id number]  DOB: [birth year]  DOA: 02/17/2020     12  PCP: Josetta Huddle, MD  CC: abdominal pain  Hospital Course: Cynthia Frye is a 60 y.o. female, with medical history of pancreatic cancer s/p chemotherapy, lupus, asthma, recent COVID-19 infection, which was treated with remdesivir in the hospital for 3 days during an admission for sepsis of unclear origin who has continued to have abdominal pain, nausea, and failure to thrive at home prompting return to the ED 1/23.  She was tachycardic, tachypneic with CT abd/pelvis demonstrating known widespread cancer and mild heterogenous contrast uptake in kidneys bilaterally.  She was admitted to the hospital for antibiotics, fluids, and further goals of care discussions.   Interval History:  No events overnight.  Very lethargic and unable to really answer any questions. Mostly nonverbal but trying to answer questions but does not make sense.   Old records reviewed in assessment of this patient  ROS: Constitutional: positive for fatigue and malaise, Respiratory: negative for cough, Cardiovascular: negative for chest pain and Gastrointestinal: positive for abdominal pain  Assessment & Plan: Goals of care discussion:  - The patient has advanced cancer which has progressed despite treatment (which she has tolerated poorly), now no longer on treatment of any kind. Hospice has been recommended.  - goals of care meeting (1/31) with Dr. Burr Medico and Dr. Domingo Cocking, appreciate their assistance -> due to her progressive liver function, encephalopathy, and poor overall prognosis, recommended inpatient hospice (discussion on 1/31).  Appreciate assistance of Dr. Domingo Cocking and Dr. Burr Medico.  Mr. Desautel and Winda are processing discussions, they haven't made a decision yet - appreciate palliative care assistance.  -Patient continues to have ongoing decline.  Very encephalopathic at this time - Husband and patient are amenable for  [facility] at this time; will await bed availability - will likely start to transition to comfort care; follow up code status as PCM currently discussing GOC today   Acute Metabolic Encephalopathy  Hepatic Encephalopathy: mental status seems improved on lactulose recently.  Ammonia improved, asterixis is improved.  Encephalopathy seems improved.  Suspect this is hepatic encephalopathy due to her progressive liver failure.  Meds could also contribute, but she still complains of pain.  Lactulose, continue to monitor; likely will start to de-escalate meds to pursue comfort   Hypoalbuminemia  Anasarca: will give albumin, continue to monitor.   Mild amount of ascitics on [place]  Severe sepsis due to pyelonephritis: Diagnosed at admission based on tachycardia, tachypnea, abnormalities of kidneys on CT abdomen. - Febrile 1/24. Urine culture growing Enterococcus faecalis >susceptibility to ampicillin (1/25 - 2/1 - currently on day 10 when including vanc - discussed with pharmacy) plan for 10 days - Follow blood cultures, repeat cultures now since febrile.  Recent covid-19 infection: +SARS-CoV-2 testing 1/7. s/p Pfizer July, Aug 2021. Treated with some remdesivir, never hypoxic to require steroids. Has completed 10 days of isolation.  - DC isolation.  Acute left CFV DVT, chronic right-sided pulmonary emboli: Left iliac vein DVT (10/2019), DVT in the left peroneal and gastrocnemius veins (01/07/2020), DVT in the left common femoral vein (02/01/2020) - Lovenox 1mg /kg q12h - likely d/c if going comfort   Hyponatremia:  - d/c LR in setting of anasarca  Hypokalemia: replace and follow  Nausea, vomiting, intolerance to enteral feeding, GERD, severe protein calorie malnutrition: Prealbumin undetectable at recent hospitalization. - Maximize antiemetics, hold TPN for now.   Metastatic pancreatic cancer, elevated LFTs  Liver  Failure: Progressive despite chemotherapy which is also poorly tolerated.  No longer on chemotherapy, [place] conversations as noted above.  - bilirubin rising, 8.2 today, INR 2.  AST/ALT relatively stable. continue to follow (no intrahepatic or extrahepatic ductal dilatation on 1/23 CT scan) -> she has likely hepatic encephalopathy, progressive liver failure - prognosis is poor - discussed with Dr. Burr Medico - likely 2/2 metastatic pancreatic cancer - Palliative care consulted  Pancytopenia, anemia of malignancy:  - fluctuating; no further workup in setting of pursuing comfort care soon  Thrombocytopenia: resolved  SLE: No flare. Hold home meds for now  HTN:  - Restart metoprolol. Hold other home meds with softer BPs on admit. Restart when rising BP.   DVT prophylaxis: Lovenox  Code Status:   Code Status: Full Code Family Communication: none present  Disposition Plan: Status is: Inpatient  Remains inpatient appropriate because:Ongoing active pain requiring inpatient pain management, Unsafe d/c plan, IV treatments appropriate due to intensity of illness or inability to take PO and Inpatient level of care appropriate due to severity of illness   Dispo: The patient is from: Home              Anticipated d/c is to: likely residential hospice              Anticipated d/c date is: when bed available               Patient currently is not medically stable to d/c.   Difficult to place patient No  Risk of unplanned readmission score: Unplanned Admission- Pilot do not use: 54.9   Objective: Blood pressure 122/88, pulse (!) 118, temperature 98.2 F (36.8 C), temperature source Oral, resp. rate 18, height 5\' 7"  (1.702 m), weight 79.4 kg, SpO2 99 %.  Examination: General appearance: Chronically ill-appearing adult woman lying in bed in no distress; even more encephalopathic today, cannot engage in any conversation Head: Normocephalic, without obvious abnormality, atraumatic Eyes: EOMI Lungs: clear to auscultation bilaterally Heart: regular rate and rhythm and S1,  S2 normal Abdomen: Obese, soft, nonspecific tenderness throughout.  Bowel sounds present Extremities: Trace lower extremity edema Skin: mobility and turgor normal Neurologic: moves all 4 extremities purposefully  Consultants:     Procedures:     Data Reviewed: I have personally reviewed following labs and imaging studies Results for orders placed or performed during the hospital encounter of 02/17/20 (from the past 24 hour(s))  CBC with Differential/Platelet     Status: Abnormal   Collection Time: 02/29/20  3:56 AM  Result Value Ref Range   WBC 11.1 (H) 4.0 - 10.5 K/uL   RBC 2.42 (L) 3.87 - 5.11 MIL/uL   Hemoglobin 8.0 (L) 12.0 - 15.0 g/dL   HCT 25.2 (L) 36.0 - 46.0 %   MCV 104.1 (H) 80.0 - 100.0 fL   MCH 33.1 26.0 - 34.0 pg   MCHC 31.7 30.0 - 36.0 g/dL   RDW 23.0 (H) 11.5 - 15.5 %   Platelets 157 150 - 400 K/uL   nRBC 1.0 (H) 0.0 - 0.2 %   Neutrophils Relative % 74 %   Neutro Abs 8.3 (H) 1.7 - 7.7 K/uL   Lymphocytes Relative 15 %   Lymphs Abs 1.7 0.7 - 4.0 K/uL   Monocytes Relative 8 %   Monocytes Absolute 0.9 0.1 - 1.0 K/uL   Eosinophils Relative 1 %   Eosinophils Absolute 0.1 0.0 - 0.5 K/uL   Basophils Relative 1 %   Basophils Absolute 0.1 0.0 -  0.1 K/uL   Immature Granulocytes 1 %   Abs Immature Granulocytes 0.07 0.00 - 0.07 K/uL   Polychromasia PRESENT    Target Cells PRESENT   Comprehensive metabolic panel     Status: Abnormal   Collection Time: 02/29/20  3:56 AM  Result Value Ref Range   Sodium 137 135 - 145 mmol/L   Potassium 3.2 (L) 3.5 - 5.1 mmol/L   Chloride 102 98 - 111 mmol/L   CO2 28 22 - 32 mmol/L   Glucose, Bld 86 70 - 99 mg/dL   BUN 9 6 - 20 mg/dL   Creatinine, Ser 0.48 0.44 - 1.00 mg/dL   Calcium 8.5 (L) 8.9 - 10.3 mg/dL   Total Protein 7.2 6.5 - 8.1 g/dL   Albumin 1.6 (L) 3.5 - 5.0 g/dL   AST 148 (H) 15 - 41 U/L   ALT 57 (H) 0 - 44 U/L   Alkaline Phosphatase 188 (H) 38 - 126 U/L   Total Bilirubin 7.3 (H) 0.3 - 1.2 mg/dL   GFR, Estimated  >60 >60 mL/min   Anion gap 7 5 - 15  Magnesium     Status: None   Collection Time: 02/29/20  3:56 AM  Result Value Ref Range   Magnesium 2.0 1.7 - 2.4 mg/dL  Phosphorus     Status: None   Collection Time: 02/29/20  3:56 AM  Result Value Ref Range   Phosphorus 2.5 2.5 - 4.6 mg/dL    Recent Results (from the past 240 hour(s))  Culture, blood (routine x 2)     Status: None   Collection Time: 02/19/20  1:27 PM   Specimen: BLOOD  Result Value Ref Range Status   Specimen Description   Final    BLOOD RIGHT ANTECUBITAL Performed at [facility], [address] [place], [place] [address]    Special Requests   Final    BOTTLES DRAWN AEROBIC ONLY Blood Culture results may not be optimal due to an inadequate volume of blood received in culture bottles Performed at [facility] [address] [place], [place] [address]    Culture   Final    NO GROWTH 5 DAYS Performed at [facility], [place] [address], [place], [place] [address]    Report Status 02/24/2020 FINAL  Final  Culture, blood (routine x 2)     Status: None   Collection Time: 02/19/20  1:34 PM   Specimen: BLOOD RIGHT HAND  Result Value Ref Range Status   Specimen Description   Final    BLOOD RIGHT HAND Performed at [facility] [address] [place], [place] [address]    Special Requests   Final    BOTTLES DRAWN AEROBIC ONLY Blood Culture results may not be optimal due to an inadequate volume of blood received in culture bottles Performed at [facility] [address] [place], [place] [address]    Culture   Final    NO GROWTH 5 DAYS Performed at [facility], [place] [address], [place], [place] [address]    Report Status 02/24/2020 FINAL  Final     Radiology Studies: No results found. [place] ASCITES (ABDOMEN LIMITED)  Final Result    CT ABDOMEN PELVIS W CONTRAST  Final Result    DG Chest Port 1 View  Final Result       Scheduled Meds: . Chlorhexidine Gluconate Cloth  6 each Topical Daily  . dronabinol  2.5 mg Oral  BID AC  . enoxaparin  80 mg Subcutaneous Q12H  . feeding supplement  1 Container Oral Q24H  . feeding supplement  237 mL Oral Q24H  . lactulose  20 g Oral TID  . mouth rinse  15 mL Mouth Rinse BID  . metoprolol succinate  75 mg Oral Daily  . sodium chloride flush  10-40 mL Intracatheter Q12H  . sucralfate  1 g Oral TID WC & HS   PRN Meds: sodium chloride, guaiFENesin-dextromethorphan, HYDROmorphone (DILAUDID) injection, lip balm, LORazepam, ondansetron (ZOFRAN) IV, prochlorperazine, sodium chloride flush, topiramate Continuous Infusions: . sodium chloride 500 mL (02/29/20 1050)     LOS: 12 days  Time spent: Greater than 50% of the 35 minute visit was spent in counseling/coordination of care for the patient as laid out in the A&P.   Dwyane Dee, MD [facility] 02/29/2020, 12:08 PM

## 2020-03-01 LAB — COMPREHENSIVE METABOLIC PANEL
ALT: 48 U/L — ABNORMAL HIGH (ref 0–44)
AST: 127 U/L — ABNORMAL HIGH (ref 15–41)
Albumin: 1.5 g/dL — ABNORMAL LOW (ref 3.5–5.0)
Alkaline Phosphatase: 163 U/L — ABNORMAL HIGH (ref 38–126)
Anion gap: 7 (ref 5–15)
BUN: 10 mg/dL (ref 6–20)
CO2: 24 mmol/L (ref 22–32)
Calcium: 8 mg/dL — ABNORMAL LOW (ref 8.9–10.3)
Chloride: 102 mmol/L (ref 98–111)
Creatinine, Ser: 0.42 mg/dL — ABNORMAL LOW (ref 0.44–1.00)
GFR, Estimated: >60 mL/min (ref >60)
Glucose, Bld: 77 mg/dL (ref 70–99)
Potassium: 3.1 mmol/L — ABNORMAL LOW (ref 3.5–5.1)
Sodium: 133 mmol/L — ABNORMAL LOW (ref 135–145)
Total Bilirubin: 6.9 mg/dL — ABNORMAL HIGH (ref 0.3–1.2)
Total Protein: 6.4 g/dL — ABNORMAL LOW (ref 6.5–8.1)

## 2020-03-01 LAB — CBC WITH DIFFERENTIAL/PLATELET
Abs Immature Granulocytes: 0.07 10*3/uL (ref 0.00–0.07)
Basophils Absolute: 0.1 10*3/uL (ref 0.0–0.1)
Basophils Relative: 1 %
Eosinophils Absolute: 0.1 10*3/uL (ref 0.0–0.5)
Eosinophils Relative: 1 %
HCT: 24.8 % — ABNORMAL LOW (ref 36.0–46.0)
Hemoglobin: 7.8 g/dL — ABNORMAL LOW (ref 12.0–15.0)
Immature Granulocytes: 1 %
Lymphocytes Relative: 18 %
Lymphs Abs: 1.7 10*3/uL (ref 0.7–4.0)
MCH: 32.9 pg (ref 26.0–34.0)
MCHC: 31.5 g/dL (ref 30.0–36.0)
MCV: 104.6 fL — ABNORMAL HIGH (ref 80.0–100.0)
Monocytes Absolute: 0.8 10*3/uL (ref 0.1–1.0)
Monocytes Relative: 8 %
Neutro Abs: 7.2 10*3/uL (ref 1.7–7.7)
Neutrophils Relative %: 71 %
Platelets: 150 10*3/uL (ref 150–400)
RBC: 2.37 MIL/uL — ABNORMAL LOW (ref 3.87–5.11)
RDW: 23 % — ABNORMAL HIGH (ref 11.5–15.5)
WBC: 9.9 10*3/uL (ref 4.0–10.5)
nRBC: 0.8 % — ABNORMAL HIGH (ref 0.0–0.2)

## 2020-03-01 LAB — MAGNESIUM: Magnesium: 1.9 mg/dL (ref 1.7–2.4)

## 2020-03-01 LAB — PHOSPHORUS: Phosphorus: 2.4 mg/dL — ABNORMAL LOW (ref 2.5–4.6)

## 2020-03-01 NOTE — Progress Notes (Signed)
Manufacturing engineer (ACC)  Advised by [employer] of continued family interest in [facility] for end of life care.  Chart under review by [facility] MD at this time.  [facility] is unable to offer a bed at this time. [place] liaisons will continue to follow and will update when bed is available.  Thank you for the opportunity to participate in this patient's care.  Domenic Moras, BSN, RN [facility] Liaison [phone] (24h on call)

## 2020-03-01 NOTE — Plan of Care (Signed)
Plan of care discussed with the patient.  Minimal response, interaction from the patient.

## 2020-03-01 NOTE — Progress Notes (Signed)
PROGRESS NOTE    Cynthia Frye   QAS:[id number]  DOB: [birth year]  DOA: 02/17/2020     13  PCP: Josetta Huddle, MD  CC: abdominal pain  Hospital Course: Racquel Arkin is a 60 y.o. female, with medical history of pancreatic cancer s/p chemotherapy, lupus, asthma, recent COVID-19 infection, which was treated with remdesivir in the hospital for 3 days during an admission for sepsis of unclear origin who has continued to have abdominal pain, nausea, and failure to thrive at home prompting return to the ED 1/23.  She was tachycardic, tachypneic with CT abd/pelvis demonstrating known widespread cancer and mild heterogenous contrast uptake in kidneys bilaterally.  She was admitted to the hospital for antibiotics, fluids, and further goals of care discussions.   Interval History:  No events overnight.  Awake but confused this morning and unable to really answer any questions.  Answers yes or no but answers are inappropriate compared to questions even open-ended questions.  Old records reviewed in assessment of this patient  ROS: Constitutional: positive for fatigue and malaise, Respiratory: negative for cough, Cardiovascular: negative for chest pain and Gastrointestinal: positive for abdominal pain  Assessment & Plan: Goals of care discussion:  - The patient has advanced cancer which has progressed despite treatment (which she has tolerated poorly), now no longer on treatment of any kind. Hospice has been recommended.  - goals of care meeting (1/31) with Dr. Burr Medico and Dr. Domingo Cocking, appreciate their assistance -> due to her progressive liver function, encephalopathy, and poor overall prognosis, recommended inpatient hospice (discussion on 1/31).  Appreciate assistance of Dr. Domingo Cocking and Dr. Burr Medico.  Mr. Chittum and Magin are processing discussions, they haven't made a decision yet - appreciate palliative care assistance.  -Patient continues to have ongoing decline.  Very encephalopathic at this  time - Husband and patient are amenable for [facility] at this time; will await bed availability -Continue comfort care with plan to discharge to [facility] when bed available  Acute Metabolic Encephalopathy  Hepatic Encephalopathy: mental status seems improved on lactulose recently.  Ammonia improved, asterixis is improved.  Encephalopathy seems improved.  Suspect this is hepatic encephalopathy due to her progressive liver failure.  Meds could also contribute, but she still complains of pain.  Lactulose, continue to monitor; likely will start to de-escalate meds to pursue comfort   Hypoalbuminemia  Anasarca: will give albumin, continue to monitor.   Mild amount of ascitics on [place]  Severe sepsis due to pyelonephritis: Diagnosed at admission based on tachycardia, tachypnea, abnormalities of kidneys on CT abdomen. - Febrile 1/24. Urine culture growing Enterococcus faecalis >susceptibility to ampicillin (1/25 - 2/1 - currently on day 10 when including vanc - discussed with pharmacy) plan for 10 days -Transitioning to comfort care/hospice  Recent covid-19 infection: +SARS-CoV-2 testing 1/7. s/p Pfizer July, Aug 2021. Treated with some remdesivir, never hypoxic to require steroids. Has completed 10 days of isolation.  - DC isolation.  Acute left CFV DVT, chronic right-sided pulmonary emboli: Left iliac vein DVT (10/2019), DVT in the left peroneal and gastrocnemius veins (01/07/2020), DVT in the left common femoral vein (02/01/2020) - Lovenox 1mg /kg q12h - likely d/c if going comfort   Hyponatremia:  - d/c LR in setting of anasarca  Hypokalemia: replace and follow  Nausea, vomiting, intolerance to enteral feeding, GERD, severe protein calorie malnutrition: Prealbumin undetectable at recent hospitalization. - Maximize antiemetics, hold TPN for now.   Metastatic pancreatic cancer, elevated LFTs  Liver Failure: Progressive despite chemotherapy which is also  poorly tolerated. No  longer on chemotherapy, [place] conversations as noted above.  - bilirubin rising, 8.2 today, INR 2.  AST/ALT relatively stable. continue to follow (no intrahepatic or extrahepatic ductal dilatation on 1/23 CT scan) -> she has likely hepatic encephalopathy, progressive liver failure - prognosis is poor - discussed with Dr. Burr Medico - likely 2/2 metastatic pancreatic cancer - Palliative care consulted, appreciate GOC discussion assistance   Pancytopenia, anemia of malignancy:  - fluctuating; no further workup in setting of pursuing comfort care soon  Thrombocytopenia: resolved  SLE: No flare. Hold home meds for now  HTN:  - continue toprol    DVT prophylaxis: Lovenox  Code Status:   Code Status: DNR Family Communication: none present  Disposition Plan: Status is: Inpatient  Remains inpatient appropriate because:Ongoing active pain requiring inpatient pain management, Unsafe d/c plan, IV treatments appropriate due to intensity of illness or inability to take PO and Inpatient level of care appropriate due to severity of illness   Dispo: The patient is from: Home              Anticipated d/c is to: [facility]              Anticipated d/c date is: when bed available               Patient currently is not medically stable to d/c.   Difficult to place patient No  Risk of unplanned readmission score: Unplanned Admission- Pilot do not use: 52.92   Objective: Blood pressure 121/70, pulse (!) 101, temperature 99 F (37.2 C), resp. rate 18, height 5\' 7"  (1.702 m), weight 79.4 kg, SpO2 97 %.  Examination: General appearance: Chronically ill-appearing adult woman lying in bed in no distress; even more encephalopathic today, cannot engage in any conversation Head: Normocephalic, without obvious abnormality, atraumatic Eyes: EOMI Lungs: clear to auscultation bilaterally Heart: regular rate and rhythm and S1, S2 normal Abdomen: Obese, soft, nonspecific tenderness throughout.  Bowel sounds  present Extremities: Trace lower extremity edema Skin: mobility and turgor normal Neurologic: asterixis appreciated, moves all 4 extremities   Consultants:     Procedures:     Data Reviewed: I have personally reviewed following labs and imaging studies Results for orders placed or performed during the hospital encounter of 02/17/20 (from the past 24 hour(s))  CBC with Differential/Platelet     Status: Abnormal   Collection Time: 03/01/20  3:35 AM  Result Value Ref Range   WBC 9.9 4.0 - 10.5 K/uL   RBC 2.37 (L) 3.87 - 5.11 MIL/uL   Hemoglobin 7.8 (L) 12.0 - 15.0 g/dL   HCT 24.8 (L) 36.0 - 46.0 %   MCV 104.6 (H) 80.0 - 100.0 fL   MCH 32.9 26.0 - 34.0 pg   MCHC 31.5 30.0 - 36.0 g/dL   RDW 23.0 (H) 11.5 - 15.5 %   Platelets 150 150 - 400 K/uL   nRBC 0.8 (H) 0.0 - 0.2 %   Neutrophils Relative % 71 %   Neutro Abs 7.2 1.7 - 7.7 K/uL   Lymphocytes Relative 18 %   Lymphs Abs 1.7 0.7 - 4.0 K/uL   Monocytes Relative 8 %   Monocytes Absolute 0.8 0.1 - 1.0 K/uL   Eosinophils Relative 1 %   Eosinophils Absolute 0.1 0.0 - 0.5 K/uL   Basophils Relative 1 %   Basophils Absolute 0.1 0.0 - 0.1 K/uL   Immature Granulocytes 1 %   Abs Immature Granulocytes 0.07 0.00 - 0.07 K/uL  Polychromasia PRESENT    Target Cells PRESENT   Comprehensive metabolic panel     Status: Abnormal   Collection Time: 03/01/20  3:35 AM  Result Value Ref Range   Sodium 133 (L) 135 - 145 mmol/L   Potassium 3.1 (L) 3.5 - 5.1 mmol/L   Chloride 102 98 - 111 mmol/L   CO2 24 22 - 32 mmol/L   Glucose, Bld 77 70 - 99 mg/dL   BUN 10 6 - 20 mg/dL   Creatinine, Ser 0.42 (L) 0.44 - 1.00 mg/dL   Calcium 8.0 (L) 8.9 - 10.3 mg/dL   Total Protein 6.4 (L) 6.5 - 8.1 g/dL   Albumin 1.5 (L) 3.5 - 5.0 g/dL   AST 127 (H) 15 - 41 U/L   ALT 48 (H) 0 - 44 U/L   Alkaline Phosphatase 163 (H) 38 - 126 U/L   Total Bilirubin 6.9 (H) 0.3 - 1.2 mg/dL   GFR, Estimated >60 >60 mL/min   Anion gap 7 5 - 15  Magnesium     Status: None    Collection Time: 03/01/20  3:35 AM  Result Value Ref Range   Magnesium 1.9 1.7 - 2.4 mg/dL  Phosphorus     Status: Abnormal   Collection Time: 03/01/20  3:35 AM  Result Value Ref Range   Phosphorus 2.4 (L) 2.5 - 4.6 mg/dL    No results found for this or any previous visit (from the past 240 hour(s)).   Radiology Studies: No results found. [place] ASCITES (ABDOMEN LIMITED)  Final Result    CT ABDOMEN PELVIS W CONTRAST  Final Result    DG Chest Port 1 View  Final Result      Scheduled Meds: . Chlorhexidine Gluconate Cloth  6 each Topical Daily  . dronabinol  2.5 mg Oral BID AC  . enoxaparin  80 mg Subcutaneous Q12H  . feeding supplement  1 Container Oral Q24H  . feeding supplement  237 mL Oral Q24H  . lactulose  20 g Oral TID  . mouth rinse  15 mL Mouth Rinse BID  . metoprolol succinate  75 mg Oral Daily  . sodium chloride flush  10-40 mL Intracatheter Q12H  . sucralfate  1 g Oral TID WC & HS   PRN Meds: sodium chloride, guaiFENesin-dextromethorphan, HYDROmorphone (DILAUDID) injection, lip balm, LORazepam, ondansetron (ZOFRAN) IV, prochlorperazine, sodium chloride flush, topiramate Continuous Infusions: . sodium chloride 10 mL/hr at 02/29/20 1549     LOS: 13 days  Time spent: Greater than 50% of the 35 minute visit was spent in counseling/coordination of care for the patient as laid out in the A&P.   Dwyane Dee, MD [facility] 03/01/2020, 12:57 PM

## 2020-03-01 NOTE — Progress Notes (Signed)
Daily Progress Note   Cynthia Frye Name: Cynthia Frye       Date: 03/01/2020 DOB: Jan 15, 1961  Age: 60 y.o. MRN#: [id number] Attending Physician: Dwyane Dee, MD Primary Care Physician: Josetta Huddle, MD Admit Date: 02/17/2020  Reason for Consultation/Follow-up: Establishing goals of care  Subjective: Cynthia Frye is resting in bed, she has ongoing worsening edema and she is more lethargic, how ever, she opens her eyes and does interact some with me. I discussed again with her about [facility] and she states she is aware of her going there, she also asks me whether I spoke with her husband, I discussed with her that I did touch base with him yesterday on 02-29-20.       Length of Stay: 13  Current Medications: Scheduled Meds:  . Chlorhexidine Gluconate Cloth  6 each Topical Daily  . dronabinol  2.5 mg Oral BID AC  . enoxaparin  80 mg Subcutaneous Q12H  . feeding supplement  1 Container Oral Q24H  . feeding supplement  237 mL Oral Q24H  . lactulose  20 g Oral TID  . mouth rinse  15 mL Mouth Rinse BID  . metoprolol succinate  75 mg Oral Daily  . sodium chloride flush  10-40 mL Intracatheter Q12H  . sucralfate  1 g Oral TID WC & HS    Continuous Infusions: . sodium chloride 10 mL/hr at 02/29/20 1549    PRN Meds: sodium chloride, guaiFENesin-dextromethorphan, HYDROmorphone (DILAUDID) injection, lip balm, LORazepam, ondansetron (ZOFRAN) IV, prochlorperazine, sodium chloride flush, topiramate  Physical Exam        General: appears lethargic and confused  Chronically ill appearing.   HEENT: No bruits, no goiter, no JVD Heart: Regular rate and rhythm. No murmur appreciated. Lungs: Good air movement, clear Abdomen: Soft, nontender, nondistended, positive bowel sounds.  Ext: has  worsening edema Skin: Warm and dry  Vital Signs: BP 121/70 (BP Location: Left Arm)   Pulse (!) 101   Temp 99 F (37.2 C)   Resp 18   Ht 5\' 7"  (1.702 m)   Wt 79.4 kg   SpO2 97%   BMI 27.42 kg/m  SpO2: SpO2: 97 % O2 Device: O2 Device: Room Air O2 Flow Rate:    Intake/output summary:   Intake/Output Summary (Last 24 hours) at 03/01/2020 1337 Last data filed  at 03/01/2020 0600 Gross per 24 hour  Intake 311.29 ml  Output --  Net 311.29 ml   LBM: Last BM Date: 02/29/20 Baseline Weight: Weight: 79.4 kg Most recent weight: Weight: 79.4 kg       Palliative Assessment/Data:    Flowsheet Rows   Flowsheet Row Most Recent Value  Intake Tab   Referral Department Hospitalist  Unit at Time of Referral Med/Surg Unit  Palliative Care Primary Diagnosis Cancer  Date Notified 02/18/20  Palliative Care Type Return Cynthia Frye Palliative Care  Reason for referral Non-pain Symptom, Clarify Goals of Care  Date of Admission 02/17/20  Date first seen by Palliative Care 02/19/20  # of days Palliative referral response time 1 Day(s)  # of days IP prior to Palliative referral 1  Clinical Assessment   Palliative Performance Scale Score 30%  Psychosocial & Spiritual Assessment   Palliative Care Outcomes   Cynthia Frye/Family meeting held? Yes  Who was at the meeting? Cynthia Frye, husband      Cynthia Frye Active Problem List   Diagnosis Date Noted  . Palliative care by specialist   . General weakness   . Pancreatic cancer metastasized to liver ([place])   . Pyelonephritis   . Severe sepsis ([place]) 02/17/2020  . COVID-19 virus infection 02/01/2020  . Sepsis ([place]) 02/01/2020  . Biliary obstruction 01/15/2020  . Abdominal pain 01/04/2020  . Dysphagia 12/10/2019  . Malnutrition of moderate degree 11/26/2019  . 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 Constipation 11/25/2019  . Generalized abdominal pain   . Nausea and vomiting in adult 11/24/2019  . Dehydration 11/24/2019  . Prolonged QT interval  11/24/2019  . Emesis   . Tachycardia   . Genetic testing 11/12/2019  . Colitis 11/04/2019  . DVT (deep venous thrombosis) ([place]) 11/04/2019  . Hypokalemia 11/04/2019  . Port-A-Cath in place 10/24/2019  . Goals of care, counseling/discussion 10/18/2019  . Pancreatic cancer ([place]) 10/02/2019  . Chronic migraine without aura without status migrainosus, not intractable 04/23/2019  . Migraine with aura and without status migrainosus, not intractable 04/23/2019  . Essential hypertension 04/29/2016  . Lupus (systemic lupus erythematosus) ([place]) 04/29/2016  . Asthma 04/29/2016  . Seasonal allergies 04/29/2016  . Abnormal stress test 04/09/2016    Palliative Care Assessment & Plan   Cynthia Frye Profile: Cynthia Frye is a 60 year old female with metastatic pancreatic cancer, lupus, asthma, recent COVID-19 infection who presented to the hospital with continued abdominal pain, nausea, and failure to thrive.  CT demonstrates widespread cancer with heterogenous contrast uptake in the kidneys.  She is currently being treated with antibiotics for severe sepsis due to pyelonephritis.  Palliative consulted for goals of care.  Recommendations/Plan: Recommend residential hospice Prognosis likely days to less than 2 weeks in my opinion.  Continue current mode of care Await bed availability at residential hospice.      Code Status: DNR DNI as of 02-29-20.   .    Code Status Orders  (From admission, onward)         Start     Ordered   02/17/20 2319  Full code  Continuous        02/17/20 2318        Code Status History    Date Active Date Inactive Code Status Order ID Comments User Context   02/01/2020 0919 02/07/2020 2350 Full Code [id number]  Jonnie Finner, DO ED   01/15/2020 2037 01/18/2020 1841 Full Code [id number]  Elwyn Reach, MD ED   11/24/2019 1908 12/12/2019 1944 Full  Code [id number]  Eugenie Filler, MD Inpatient   11/04/2019 2125 11/09/2019 2207 Full Code [id number]  Rise Patience, MD ED   04/09/2016 1319 04/09/2016 1952 Full Code [id number]  Nelva Bush, MD Inpatient   Advance Care Planning Activity       Prognosis:  Likely less than 2 weeks in my opinion.     Discharge Planning: -  My recommendation would be to pursue residential hospice for aggressive symptom management at end-of-life care.    Care plan was discussed with Cynthia Frye and TRH MD   Thank you for allowing the Palliative Medicine Team to assist in the care of this Cynthia Frye.   Time In: 12 Time Out: 12.15 Total Time 15 Prolonged Time Billed No   Greater than 50%  of this time was spent counseling and coordinating care related to the above assessment and plan.  Loistine Chance, MD  Please contact Palliative Medicine Team phone at [phone] for questions and concerns.

## 2020-03-02 MED ORDER — METOPROLOL SUCCINATE ER 25 MG PO TB24: 75.0000 mg | ORAL_TABLET | Freq: Every day | ORAL | Status: AC

## 2020-03-02 NOTE — Plan of Care (Signed)
  Problem: Clinical Measurements: Goal: Diagnostic test results will improve Outcome: Not Met (add Reason)  Plan for [facility]

## 2020-03-02 NOTE — TOC Transition Note (Signed)
Transition of Care [facility]) - Progression Note    Patient Details  Name: Cynthia Frye MRN: [id number] Date of Birth: Apr 08, 1960  Transition of Care [facility]) CM/SW [place], [place] Phone Number: [phone] 03/02/2020, 1:39 PM  Clinical Narrative:     Pateint will d/c to [facility], Room# 9, Report [phone].  PTAR transport set up.  Attending and Rn updated.  TOC consult completed.   Expected Discharge Plan: [place] Barriers to Discharge: Barriers Resolved  Expected Discharge Plan and Services Expected Discharge Plan: [place]   Discharge Planning Services: CM Consult   Living arrangements for the past 2 months: Single Family Home Expected Discharge Date: 03/02/20                                     Social Determinants of Health (SDOH) Interventions    Readmission Risk Interventions Readmission Risk Prevention Plan 12/11/2019 11/27/2019  Transportation Screening - Complete  HRI or [place] - Complete  Social Work Consult for [place] Planning/Counseling - Complete  Palliative Care Screening - Not Applicable  Medication Review Press photographer) - Complete  PCP or Specialist appointment within 3-5 days of discharge Complete -  [place] or [place] Complete -  SW Recovery Care/Counseling Consult Complete -  Palliative Care Screening Not Applicable -  [place] Not Applicable -  Some recent data might be hidden

## 2020-03-02 NOTE — Progress Notes (Signed)
Engineer, maintenance [facility]) Hospital Liaison note.   Received request from [place] for family interest in [facility] with request for transfer today . Chart reviewed and eligibility confirmed. Spoke to husband to confirm interest and explain services.  Family agreeable to transfer today. CSW aware. This Liaison will notify CSW once consents are completed.   Dr. Orpah Melter (or whoever) to assume care per family request.    RN please call report to [phone].  Please arrange transport for patient once consents signed.   Thank you,   Clementeen Hoof, BSN, RN   [place] (listed on [facility] under Hospice and [place] [facility] [place])   [phone]

## 2020-03-02 NOTE — Plan of Care (Signed)
Pt [place] to [facility] via [place].

## 2020-03-02 NOTE — Discharge Summary (Signed)
Physician Discharge Summary   Cynthia Frye [id number] DOB: 1960-11-11 DOA: 02/17/2020  PCP: Josetta Huddle, MD  Admit date: 02/17/2020 Discharge date: 03/02/2020  Admitted From: home Disposition:  Day Discharging physician: Dwyane Dee, MD  Recommendations for Outpatient Follow-up:  1. Continue comfort care  Patient discharged to hospice in Discharge Condition: poor Risk of unplanned readmission score: Unplanned Admission- Pilot do not use: 53.36  CODE STATUS: DNR Diet recommendation:  Diet Orders (From admission, onward)    Start     Ordered   02/18/20 0644  Diet regular Room service appropriate? Yes; Fluid consistency: Thin  Diet effective now       Question Answer Comment  Room service appropriate? Yes   Fluid consistency: Thin      02/18/20 0644          Hospital Course: Cynthia Frye is a 60 y.o. female, with medical history of pancreatic cancer s/p chemotherapy, lupus, asthma, recent COVID-19 infection, which was treated with remdesivir in the hospital for 3 days during an admission for sepsis of unclear origin who has continued to have abdominal pain, nausea, and failure to thrive at home prompting return to the ED 1/23.  She was tachycardic, tachypneic with CT abd/pelvis demonstrating known widespread cancer and mild heterogenous contrast uptake in kidneys bilaterally.   Patient was evaluated by oncology and palliative care during hospitalization.  She was considered to have progressive advanced cancer and tolerated treatment poorly in the past, therefore was considered to no longer be a further treatment candidate.  Hospice was recommended for consideration.  Goals of care were discussed and patient and her husband agreed upon transitioning to comfort care/hospice.  She was referred to [facility].   Other notable conditions addressed while in the hospital include: Pyelonephritis, completed 10 days antibiotics in the hospital COVID-19 infection, positive  on 02/01/2020.  Treated with remdesivir, completed 10 days isolation. Left common femoral vein DVT, chronic PE.  Was treated with Lovenox in the hospital which was not continued at discharge given pursuit of comfort care.  Principal Diagnosis: Severe sepsis [facility])  Discharge Diagnoses: Active Hospital Problems   Diagnosis Date Noted  . Severe sepsis ([place]) 02/17/2020    Priority: High  . Pancreatic cancer metastasized to liver [facility])     Priority: High  . Palliative care by specialist   . General weakness   . Pyelonephritis   . DVT (deep venous thrombosis) ([place]) 11/04/2019    Resolved Hospital Problems  No resolved problems to display.     Allergies as of 03/02/2020      Reactions   Cinnamon Other (See Comments)   Other reaction(s): Other (See Comments) Unknown  On allergy test Unknown  On allergy test   Peanut-containing Drug Products Hives   Prednisone Other (See Comments)   Interacts with another medicine she is taking   Corn Oil Hives   Corn-containing Products Hives   Other Rash   Red grapefruit and naval oranges- lips tingling and facial rash Potatoes, tomatoes, garlic, oregano/basil caused headache Other reaction(s): migratory headache      Medication List    STOP taking these medications   dexamethasone 4 MG tablet Commonly known as: DECADRON   diclofenac 75 MG EC tablet Commonly known as: VOLTAREN   dicyclomine 10 MG capsule Commonly known as: Bentyl   diphenoxylate-atropine 2.5-0.025 MG tablet Commonly known as: LOMOTIL   dronabinol 2.5 MG capsule Commonly known as: MARINOL   enoxaparin 80 MG/0.8ML injection Commonly known as: Lovenox  ferrous sulfate 325 (65 FE) MG tablet   fluticasone 50 MCG/ACT nasal spray Commonly known as: FLONASE   hydroxychloroquine 200 MG tablet Commonly known as: PLAQUENIL   loperamide 2 MG capsule Commonly known as: IMODIUM   LORazepam 0.5 MG tablet Commonly known as: ATIVAN   losartan-hydrochlorothiazide  50-12.5 MG tablet Commonly known as: HYZAAR   mirtazapine 7.5 MG tablet Commonly known as: REMERON   OLANZapine 7.5 MG tablet Commonly known as: ZYPREXA   ondansetron 8 MG tablet Commonly known as: ZOFRAN   oxyCODONE 5 MG immediate release tablet Commonly known as: Oxy IR/ROXICODONE   Pancrelipase (Lip-Prot-Amyl) 24000-76000 units Cpep   Rheumate Caps   senna-docusate 8.6-50 MG tablet Commonly known as: Senokot-S   sterile water SOLN with amino acids 10 % SOLN 1.3 g/kg, dextrose 70 % SOLN 20 %   sucralfate 1 GM/10ML suspension Commonly known as: CARAFATE   topiramate 50 MG tablet Commonly known as: Topamax   Vitamin D 50 MCG (2000 UT) Caps     TAKE these medications   metoprolol succinate 25 MG 24 hr tablet Commonly known as: TOPROL-XL Take 3 tablets (75 mg total) by mouth daily. Take with or immediately following a meal. Start taking on: March 03, 2020 What changed:   medication strength  how much to take  additional instructions       Allergies  Allergen Reactions  . Cinnamon Other (See Comments)    Other reaction(s): Other (See Comments) Unknown  On allergy test Unknown  On allergy test  . Peanut-Containing Drug Products Hives  . Prednisone Other (See Comments)    Interacts with another medicine she is taking  . Corn Oil Hives  . Corn-Containing Products Hives  . Other Rash    Red grapefruit and naval oranges- lips tingling and facial rash Potatoes, tomatoes, garlic, oregano/basil caused headache Other reaction(s): migratory headache    Consultations: Oncology PCM  Discharge Exam: BP 126/77 (BP Location: Left Arm)   Pulse (!) 106   Temp 98.9 F (37.2 C)   Resp 17   Ht 5\' 7"  (1.702 m)   Wt 79.4 kg   SpO2 96%   BMI 27.42 kg/m  General appearance: Chronically ill-appearing adult woman lying in bed in no distress; little more awake today but unable to answer questions appropriately  Head: Normocephalic, without obvious abnormality,  atraumatic Eyes: EOMI Lungs: clear to auscultation bilaterally Heart: regular rate and rhythm and S1, S2 normal Abdomen: Obese, soft, nonspecific tenderness throughout.  Bowel sounds present Extremities: 2-3+lower extremity edema Skin: mobility and turgor normal Neurologic: asterixis appreciated, moves all 4 extremities   The results of significant diagnostics from this hospitalization (including imaging, microbiology, ancillary and laboratory) are listed below for reference.   Microbiology: No results found for this or any previous visit (from the past 240 hour(s)).   Labs: BNP (last 3 results) No results for input(s): BNP in the last 8760 hours. Basic Metabolic Panel: Recent Labs  Lab 02/26/20 0412 02/27/20 0309 02/28/20 0336 02/29/20 0356 03/01/20 0335  NA 140 137 134* 137 133*  K 3.5 3.2* 3.1* 3.2* 3.1*  CL 105 103 99 102 102  CO2 24 24 23 28 24   GLUCOSE 89 91 79 86 77  BUN 7 8 9 9 10   CREATININE 0.40* 0.50 0.43* 0.48 0.42*  CALCIUM 8.5* 8.3* 8.3* 8.5* 8.0*  MG 1.7 1.7 1.7 2.0 1.9  PHOS 2.8 2.4* 2.4* 2.5 2.4*   Liver Function Tests: Recent Labs  Lab 02/26/20 6789 02/27/20 0309  02/28/20 0336 02/29/20 0356 03/01/20 0335  AST 135* 134* 144* 148* 127*  ALT 61* 60* 58* 57* 48*  ALKPHOS 248* 224* 212* 188* 163*  BILITOT 8.2* 7.7* 7.6* 7.3* 6.9*  PROT 7.2 7.0 7.1 7.2 6.4*  ALBUMIN 1.9* 1.7* 1.6* 1.6* 1.5*   No results for input(s): LIPASE, AMYLASE in the last 168 hours. Recent Labs  Lab 02/25/20 1024 02/26/20 0412 02/27/20 1029  AMMONIA 29 33 28   CBC: Recent Labs  Lab 02/26/20 0412 02/27/20 0309 02/28/20 0336 02/29/20 0356 03/01/20 0335  WBC 11.4* 11.7* 11.6* 11.1* 9.9  NEUTROABS 8.5* 9.4* 9.0* 8.3* 7.2  HGB 7.9* 7.9* 8.0* 8.0* 7.8*  HCT 25.4* 25.1* 25.3* 25.2* 24.8*  MCV 104.1* 103.3* 102.8* 104.1* 104.6*  PLT 172 164 169 157 150   Cardiac Enzymes: No results for input(s): CKTOTAL, CKMB, CKMBINDEX, TROPONINI in the last 168  hours. BNP: Invalid input(s): POCBNP CBG: No results for input(s): GLUCAP in the last 168 hours. D-Dimer No results for input(s): DDIMER in the last 72 hours. Hgb A1c No results for input(s): HGBA1C in the last 72 hours. Lipid Profile No results for input(s): CHOL, HDL, LDLCALC, TRIG, CHOLHDL, LDLDIRECT in the last 72 hours. Thyroid function studies No results for input(s): TSH, T4TOTAL, T3FREE, THYROIDAB in the last 72 hours.  Invalid input(s): FREET3 Anemia work up No results for input(s): VITAMINB12, FOLATE, FERRITIN, TIBC, IRON, RETICCTPCT in the last 72 hours. Urinalysis    Component Value Date/Time   COLORURINE AMBER (A) 02/17/2020 2020   APPEARANCEUR CLEAR 02/17/2020 2020   LABSPEC 1.033 (H) 02/17/2020 2020   PHURINE 8.0 02/17/2020 2020   GLUCOSEU NEGATIVE 02/17/2020 2020   HGBUR NEGATIVE 02/17/2020 2020   [place] NEGATIVE 02/17/2020 2020   KETONESUR NEGATIVE 02/17/2020 2020   PROTEINUR NEGATIVE 02/17/2020 2020   NITRITE NEGATIVE 02/17/2020 2020   LEUKOCYTESUR NEGATIVE 02/17/2020 2020   Sepsis Labs Invalid input(s): PROCALCITONIN,  WBC,  LACTICIDVEN Microbiology No results found for this or any previous visit (from the past 240 hour(s)).  Procedures/Studies: CT ABDOMEN PELVIS W CONTRAST  Result Date: 02/17/2020 CLINICAL DATA:  Abdominal pain, metastatic pancreatic cancer, COVID positive EXAM: CT ABDOMEN AND PELVIS WITH CONTRAST TECHNIQUE: Multidetector CT imaging of the abdomen and pelvis was performed using the standard protocol following bolus administration of intravenous contrast. CONTRAST:  164mL OMNIPAQUE IOHEXOL 300 MG/ML  SOLN COMPARISON:  02/06/2020 FINDINGS: Lower chest: Scarring/atelectasis in the lingula and left lower lobe. Hepatobiliary: Innumerable hepatic metastases, similar to recent CT. Gallbladder is unremarkable, noting mild gallbladder wall edema, similar. No intrahepatic or extrahepatic ductal dilatation. Pancreas: 3.9 x 2.9 cm mass in the  pancreatic head (series 2/image 46), similar, corresponding to the patient's known pancreatic adenocarcinoma. Atrophy of the pancreatic body/tail with ductal dilatation. Spleen: Within normal limits. Adrenals/Urinary Tract: Adrenal glands are within normal limits. Segmental/wedge-shaped hypoperfusion involving the posterolateral right lower kidney (series 2/images 51-52). Associated differential enhancement involving the posterior left lower kidney (series 7/image 43). These findings are nonspecific but are most suggestive of pyelonephritis. No hydronephrosis. Bladder is within normal limits. Stomach/Bowel: Stomach is within normal limits. No evidence of bowel obstruction. Appendix is not discretely visualized. No colonic wall thickening or inflammatory changes. Vascular/Lymphatic: No evidence of abdominal aortic aneurysm. Stable DVT in the left common femoral vein (series 2/image 93). 14 mm short axis portacaval node (series 2/image 41), unchanged. No suspicious pelvic lymphadenopathy. Reproductive: Status post hysterectomy. No adnexal masses. Other: Small to moderate abdominopelvic ascites. Associated mild pelvic peritoneal thickening. Musculoskeletal: Mild degenerative  changes at L5-S1. IMPRESSION: Mildly heterogeneous perfusion involving the bilateral kidneys, nonspecific but most suggestive of pyelonephritis. Otherwise, no interval change from recent CT. Pancreatic adenocarcinoma. Innumerable hepatic metastases. Small upper abdominal nodal metastasis. Small to moderate abdominopelvic ascites with mild peritoneal thickening, likely malignant. Stable DVT in the left common femoral vein. Electronically Signed   By: Julian Hy M.D.   On: 02/17/2020 16:18   CT ABDOMEN PELVIS W CONTRAST  Result Date: 02/06/2020 CLINICAL DATA:  Abdominal pain and fever. Metastatic pancreatic adenocarcinoma. EXAM: CT ABDOMEN AND PELVIS WITH CONTRAST TECHNIQUE: Multidetector CT imaging of the abdomen and pelvis was performed  using the standard protocol following bolus administration of intravenous contrast. CONTRAST:  124mL OMNIPAQUE IOHEXOL 300 MG/ML  SOLN COMPARISON:  CT 5 days ago 02/01/2020 FINDINGS: Lower chest: Stable lingular opacity. Slight increase in bibasilar atelectasis. No pleural fluid. Hepatobiliary: Innumerable hepatic metastasis. No significant change in the short interim. Layering sludge in the gallbladder. Mild gallbladder wall thickening. There is no biliary obstruction or discrete stone. Pancreas: Pancreatic head mass measures 3.4 cm prom distal pancreatic ductal dilatation with pancreatic atrophy unchanged. No adjacent inflammatory change. Spleen: Normal in size without focal abnormality. Adrenals/Urinary Tract: No adrenal nodule. The previous low-density band in the lower left kidney has improved and near completely resolved. There is homogeneous renal enhancement with symmetric excretion on delayed phase imaging. Unremarkable urinary bladder. Stomach/Bowel: Nondistended stomach. Pancreatic mass abuts the distal gastric body without gastric outlet obstruction. Small amount of enteric contrast is seen involving distal small bowel and throughout the colon. No obstruction. Suggestion of small bowel thickening and enhancement involving short segment in the left lower quadrant, series 2, image 71. There is improved submucosal low-density in the small bowel from prior. Submucosal fatty deposition of the ascending colon is again seen. Slight increase in submucosal fatty deposition involving the transverse and descending colon. Vascular/Lymphatic: Left femoral DVT again seen. No progression from prior. No portal vein thrombosis. Normal caliber abdominal aorta. No acute vascular findings. Enlarged periportal node, largest measuring 14 mm, unchanged. No progressive adenopathy in the short interim. Reproductive: Hysterectomy.  Ovaries not delineated. Other: Small volume abdominopelvic ascites, which has slightly progressed  from prior exam. Peritoneal thickening in the right pericolic gutter is again seen. Mild generalized omental and mesenteric edema. Patchy subcutaneous densities in the anterior abdominal wall with foci of air typical of medication injection sites. Musculoskeletal: No focal bone lesion or acute osseous abnormality. IMPRESSION: 1. Short segment loop of small bowel with wall thickening and mild enhancement in the left lower quadrant may represent focal enteritis. 2. Pancreatic head mass with innumerable hepatic metastasis. Peritoneal thickening in the right pericolic gutter is again seen. No significant change from CT 5 days ago. 3. Small volume abdominopelvic ascites, slightly progressed from prior exam. 4. Improved bandlike hypodensity in the lower left kidney, near completely resolved. 5. Gallbladder sludge. Gallbladder wall thickening or small amount of pericholecystic fluid, likely reactive. 6. Left femoral DVT again seen. Electronically Signed   By: Keith Rake M.D.   On: 02/06/2020 19:48   DG Chest Port 1 View  Result Date: 02/17/2020 CLINICAL DATA:  Weakness and dehydration. Recent history of COVID-19. EXAM: PORTABLE CHEST 1 VIEW COMPARISON:  Chest radiograph 02/01/2020. FINDINGS: Port-A-Cath tip projects over the superior vena cava. Monitoring leads overlie the patient. Stable cardiac and mediastinal contours. Low lung volumes with bibasilar opacities favored represent atelectasis. No pleural effusion or pneumothorax. IMPRESSION: Low lung volumes with bibasilar atelectasis. Electronically Signed   By: Dian Situ  Rosana Hoes M.D.   On: 02/17/2020 13:29   [place] ASCITES (ABDOMEN LIMITED)  Result Date: 02/24/2020 CLINICAL DATA:  Rule out ascites EXAM: LIMITED ABDOMEN ULTRASOUND FOR ASCITES TECHNIQUE: Limited ultrasound survey for ascites was performed in all four abdominal quadrants. COMPARISON:  None. FINDINGS: Small amount of ascites in all 4 quadrants.  No loculated fluid. IMPRESSION: Mild amount of ascites.  Electronically Signed   By: Franchot Gallo M.D.   On: 02/24/2020 16:08     Time coordinating discharge: Over 31 minutes    Dwyane Dee, MD  [facility] 03/02/2020, 12:28 PM

## 2020-03-04 DIAGNOSIS — R188 Other ascites: Secondary | ICD-10-CM | POA: Diagnosis not present

## 2020-03-04 DIAGNOSIS — Z8616 Personal history of COVID-19: Secondary | ICD-10-CM | POA: Diagnosis not present

## 2020-03-04 DIAGNOSIS — C787 Secondary malignant neoplasm of liver and intrahepatic bile duct: Secondary | ICD-10-CM | POA: Diagnosis not present

## 2020-03-04 DIAGNOSIS — Z86718 Personal history of other venous thrombosis and embolism: Secondary | ICD-10-CM | POA: Diagnosis not present

## 2020-03-04 DIAGNOSIS — K7290 Hepatic failure, unspecified without coma: Secondary | ICD-10-CM | POA: Diagnosis not present

## 2020-03-04 DIAGNOSIS — N12 Tubulo-interstitial nephritis, not specified as acute or chronic: Secondary | ICD-10-CM | POA: Diagnosis not present

## 2020-03-04 DIAGNOSIS — I10 Essential (primary) hypertension: Secondary | ICD-10-CM | POA: Diagnosis not present

## 2020-03-04 DIAGNOSIS — C259 Malignant neoplasm of pancreas, unspecified: Secondary | ICD-10-CM | POA: Diagnosis not present

## 2020-03-04 DIAGNOSIS — M329 Systemic lupus erythematosus, unspecified: Secondary | ICD-10-CM | POA: Diagnosis not present

## 2020-03-04 DIAGNOSIS — Z86711 Personal history of pulmonary embolism: Secondary | ICD-10-CM | POA: Diagnosis not present

## 2020-03-04 DIAGNOSIS — D696 Thrombocytopenia, unspecified: Secondary | ICD-10-CM | POA: Diagnosis not present

## 2020-03-04 DIAGNOSIS — D630 Anemia in neoplastic disease: Secondary | ICD-10-CM | POA: Diagnosis not present

## 2020-03-04 DIAGNOSIS — Z6827 Body mass index (BMI) 27.0-27.9, adult: Secondary | ICD-10-CM | POA: Diagnosis not present

## 2020-03-12 ENCOUNTER — Encounter: Payer: Self-pay | Admitting: Hematology

## 2020-03-25 DEATH — deceased

## 2020-04-02 ENCOUNTER — Encounter: Payer: Self-pay | Admitting: Hematology

## 2021-11-23 IMAGING — MG MM DIGITAL DIAGNOSTIC UNILAT*L* W/ TOMO W/ CAD
5 of 8 series · 5 of 24 positions shown · non-contrast
Comparison: Previous exam(s).
COMPARISON: Previous exam(s).
COMPARISON: Previous exam(s).

Addendum:
CLINICAL DATA: 59-year-old female, recently diagnosed with
pancreatic cancer and prominent left axillary lymphadenopathy on
recent CT evaluation. The patient had a port placed on the left side
approximately 2 weeks ago.

EXAM:
DIGITAL DIAGNOSTIC LEFT MAMMOGRAM WITH CAD AND TOMO
ULTRASOUND LEFT BREAST

[L CC synth-2D]
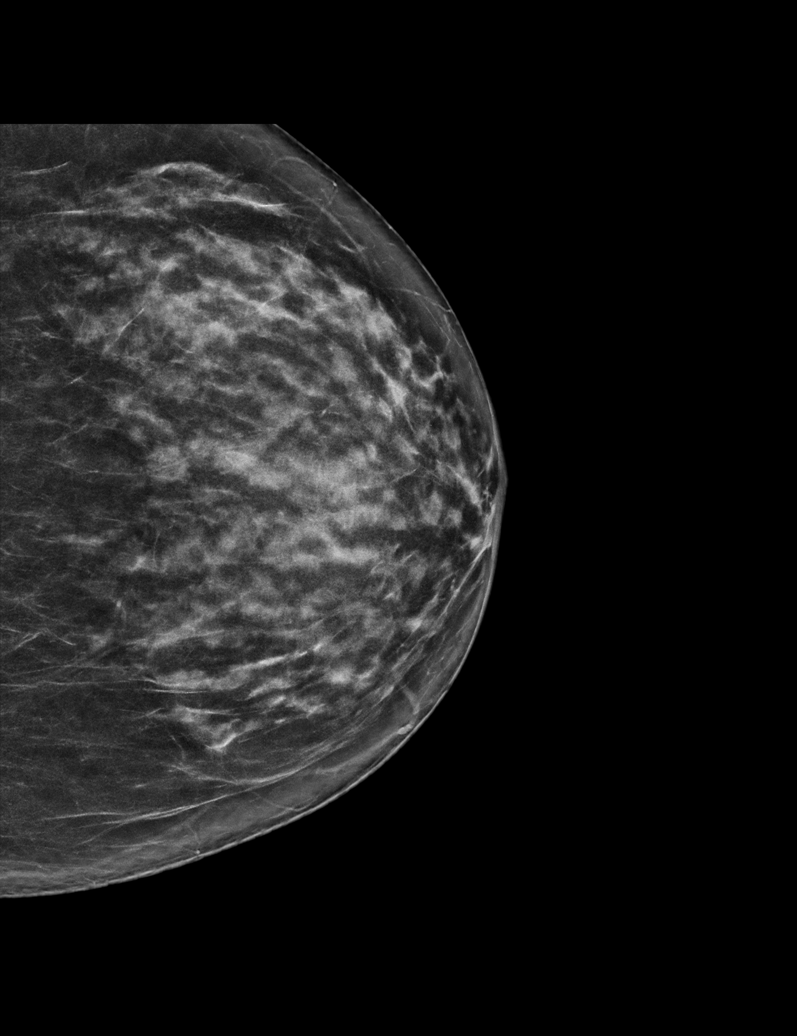

[L MLO synth-2D (1 of 3)]
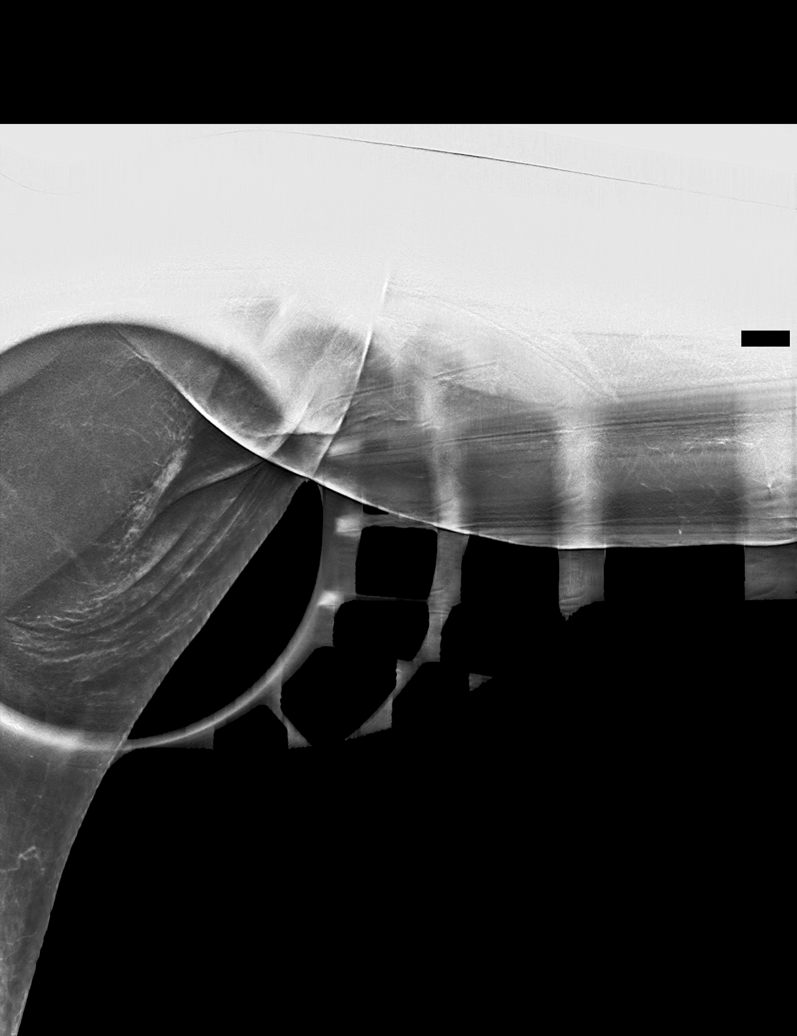

[L MLO synth-2D (2 of 3)]
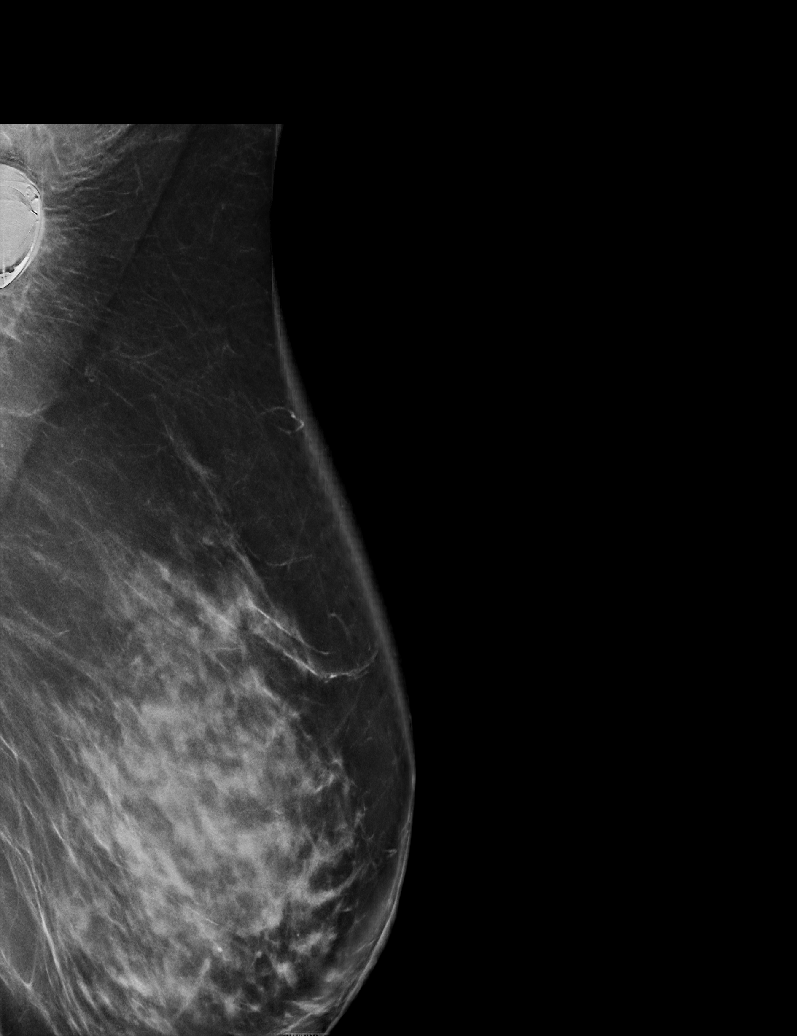

[L MLO synth-2D (3 of 3)]
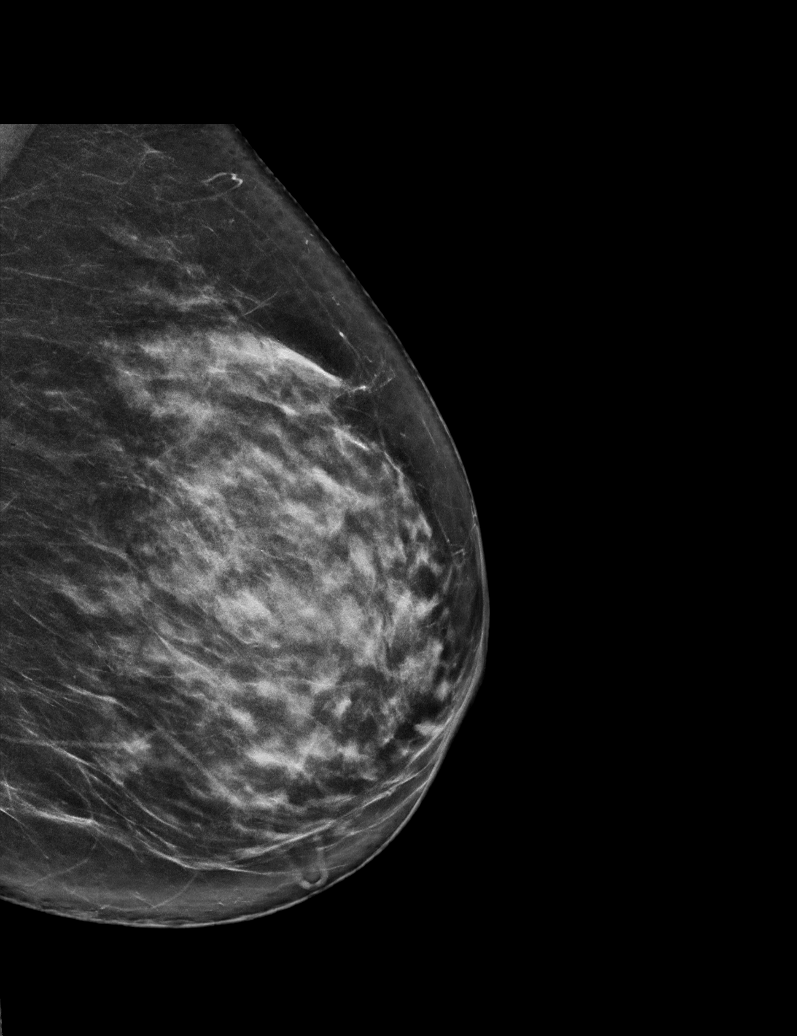

[L CC tomo · tomo slice 32/63.0]
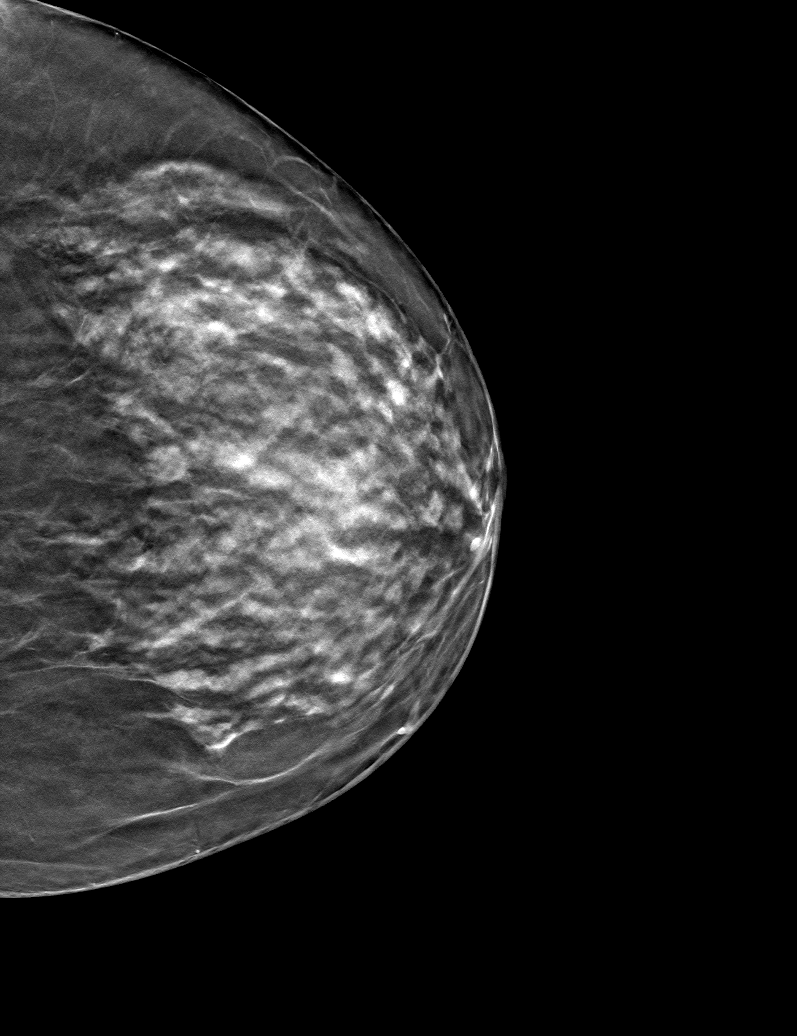

[5 of 24 positions shown; findings below may reference images not displayed]

ACR Breast Density Category c: The breast tissue is heterogeneously
dense, which may obscure small masses.
FINDINGS: No suspicious mammographic findings are identified in the left
breast. The parenchymal pattern is stable.

Mammographic images were processed with CAD.

Targeted ultrasound is performed, showing multiple prominent left
axillary lymph nodes with diffuse cortical thickening up to 5-6 mm.
IMPRESSION: 1. Left axillary lymphadenopathy in keeping with recent CT findings.
Recommendation is for ultrasound-guided biopsy. The differential
includes metastatic disease, lymphoproliferative disorder and benign
reactive changes.
2. No mammographic evidence of malignancy within the left breast.

RECOMMENDATION:
Ultrasound-guided biopsy of a left axillary lymph node is
recommended. Samples should be sent in both formalin and saline.

I have discussed the findings and recommendations with the patient.
If applicable, a reminder letter will be sent to the patient
regarding the next appointment.

BI-RADS CATEGORY  4: Suspicious.

ADDENDUM:
This is an addendum to the report originally dictated on 10/15/2019.

I spoke with the patient's referring provider Dr. Jim today, who
provided additional history that the patient had the second COVID
vaccine recently within her left arm. This may account for her
current imaging findings. Therefore, the IMPRESSION, RECOMMENDATION
BI-RADS CATEGORY should read as follows.
IMPRESSION: 1. Left axillary lymphadenopathy in keeping with recent CT findings
may be related to recent second COVID vaccine in the ipsilateral
arm.
2. No mammographic evidence of malignancy within the left breast.
RECOMMENDATION:
Recommendation is for the patient to return in 8 weeks for follow-up
ultrasound evaluation of the left axilla to ensure
improvement/resolution of her current lymphadenopathy.

BI-RADS CATEGORY:
3: Probably benign.

ADDENDUM:
I spoke with the patient on the morning of 10/25/2019. She states
her 2nd COVID vaccination was on 09/23/2019. She prefers not to wait
much longer for re-evaluation. She has been scheduled for a
follow-up ultrasound on 11/05/2019. This will be 6 weeks after the
second COVID dose. If her lymphadenopathy has not improved,
ultrasound-guided biopsy is recommended. A biopsy slot is being held
for the patient on the same day.

*** End of Addendum ***
Addendum:
ACR Breast Density Category c: The breast tissue is heterogeneously
dense, which may obscure small masses.
FINDINGS: No suspicious mammographic findings are identified in the left
breast. The parenchymal pattern is stable.

Mammographic images were processed with CAD.

Targeted ultrasound is performed, showing multiple prominent left
axillary lymph nodes with diffuse cortical thickening up to 5-6 mm.
IMPRESSION: 1. Left axillary lymphadenopathy in keeping with recent CT findings.
Recommendation is for ultrasound-guided biopsy. The differential
includes metastatic disease, lymphoproliferative disorder and benign
reactive changes.
2. No mammographic evidence of malignancy within the left breast.

RECOMMENDATION:
Ultrasound-guided biopsy of a left axillary lymph node is
recommended. Samples should be sent in both formalin and saline.

I have discussed the findings and recommendations with the patient.
If applicable, a reminder letter will be sent to the patient
regarding the next appointment.

BI-RADS CATEGORY  4: Suspicious.

ADDENDUM:
This is an addendum to the report originally dictated on 10/15/2019.

I spoke with the patient's referring provider Dr. Jim today, who
provided additional history that the patient had the second COVID
vaccine recently within her left arm. This may account for her
current imaging findings. Therefore, the IMPRESSION, RECOMMENDATION
BI-RADS CATEGORY should read as follows.
IMPRESSION: 1. Left axillary lymphadenopathy in keeping with recent CT findings
may be related to recent second COVID vaccine in the ipsilateral
arm.
2. No mammographic evidence of malignancy within the left breast.
RECOMMENDATION:
Recommendation is for the patient to return in 8 weeks for follow-up
ultrasound evaluation of the left axilla to ensure
improvement/resolution of her current lymphadenopathy.

BI-RADS CATEGORY:
3: Probably benign.

*** End of Addendum ***
ACR Breast Density Category c: The breast tissue is heterogeneously
dense, which may obscure small masses.
FINDINGS: No suspicious mammographic findings are identified in the left
breast. The parenchymal pattern is stable.

Mammographic images were processed with CAD.

Targeted ultrasound is performed, showing multiple prominent left
axillary lymph nodes with diffuse cortical thickening up to 5-6 mm.
IMPRESSION: 1. Left axillary lymphadenopathy in keeping with recent CT findings.
Recommendation is for ultrasound-guided biopsy. The differential
includes metastatic disease, lymphoproliferative disorder and benign
reactive changes.
2. No mammographic evidence of malignancy within the left breast.

RECOMMENDATION:
Ultrasound-guided biopsy of a left axillary lymph node is
recommended. Samples should be sent in both formalin and saline.

I have discussed the findings and recommendations with the patient.
If applicable, a reminder letter will be sent to the patient
regarding the next appointment.

BI-RADS CATEGORY  4: Suspicious.
